# Patient Record
Sex: Female | Born: 1965 | Hispanic: No | Marital: Married | Smoking: Never smoker
Health system: Southern US, Community
[De-identification: ages and names within clinical notes are randomized; demographics above are authoritative.]

## PROBLEM LIST (undated history)

## (undated) DIAGNOSIS — K861 Other chronic pancreatitis: Secondary | ICD-10-CM

## (undated) DIAGNOSIS — D509 Iron deficiency anemia, unspecified: Secondary | ICD-10-CM

## (undated) DIAGNOSIS — F32A Depression, unspecified: Secondary | ICD-10-CM

## (undated) DIAGNOSIS — F419 Anxiety disorder, unspecified: Secondary | ICD-10-CM

## (undated) DIAGNOSIS — R7303 Prediabetes: Secondary | ICD-10-CM

## (undated) DIAGNOSIS — F329 Major depressive disorder, single episode, unspecified: Secondary | ICD-10-CM

## (undated) DIAGNOSIS — C259 Malignant neoplasm of pancreas, unspecified: Secondary | ICD-10-CM

## (undated) DIAGNOSIS — R609 Edema, unspecified: Secondary | ICD-10-CM

## (undated) DIAGNOSIS — C787 Secondary malignant neoplasm of liver and intrahepatic bile duct: Secondary | ICD-10-CM

## (undated) HISTORY — DX: Other chronic pancreatitis: K86.1

## (undated) HISTORY — DX: Prediabetes: R73.03

## (undated) HISTORY — DX: Malignant neoplasm of pancreas, unspecified: C25.9

## (undated) HISTORY — DX: Iron deficiency anemia, unspecified: D50.9

---

## 2014-08-18 DIAGNOSIS — R7303 Prediabetes: Secondary | ICD-10-CM

## 2014-08-18 HISTORY — DX: Prediabetes: R73.03

## 2017-02-20 ENCOUNTER — Encounter (HOSPITAL_COMMUNITY): Payer: Self-pay | Admitting: Emergency Medicine

## 2017-02-20 ENCOUNTER — Emergency Department (HOSPITAL_COMMUNITY): Payer: Medicaid Other

## 2017-02-20 ENCOUNTER — Emergency Department (HOSPITAL_COMMUNITY)
Admission: EM | Admit: 2017-02-20 | Discharge: 2017-02-20 | Disposition: A | Discharge status: Home / Self Care | Payer: Medicaid Other | Attending: Emergency Medicine | Admitting: Emergency Medicine

## 2017-02-20 DIAGNOSIS — R109 Unspecified abdominal pain: Secondary | ICD-10-CM

## 2017-02-20 DIAGNOSIS — Z791 Long term (current) use of non-steroidal anti-inflammatories (NSAID): Secondary | ICD-10-CM | POA: Insufficient documentation

## 2017-02-20 DIAGNOSIS — R1013 Epigastric pain: Secondary | ICD-10-CM | POA: Diagnosis present

## 2017-02-20 DIAGNOSIS — K529 Noninfective gastroenteritis and colitis, unspecified: Secondary | ICD-10-CM | POA: Diagnosis not present

## 2017-02-20 LAB — URINALYSIS, ROUTINE W REFLEX MICROSCOPIC
GLUCOSE, UA: NEGATIVE mg/dL
Ketones, ur: NEGATIVE mg/dL
Nitrite: NEGATIVE
PH: 5 (ref 5.0–8.0)
Protein, ur: 100 mg/dL — AB
SPECIFIC GRAVITY, URINE: 1.027 (ref 1.005–1.030)

## 2017-02-20 LAB — COMPREHENSIVE METABOLIC PANEL
ALT: 18 U/L (ref 14–54)
ANION GAP: 9 (ref 5–15)
AST: 22 U/L (ref 15–41)
Albumin: 3.9 g/dL (ref 3.5–5.0)
Alkaline Phosphatase: 56 U/L (ref 38–126)
BUN: 10 mg/dL (ref 6–20)
CHLORIDE: 106 mmol/L (ref 101–111)
CO2: 22 mmol/L (ref 22–32)
CREATININE: 0.9 mg/dL (ref 0.44–1.00)
Calcium: 9.2 mg/dL (ref 8.9–10.3)
Glucose, Bld: 126 mg/dL — ABNORMAL HIGH (ref 65–99)
Potassium: 3.6 mmol/L (ref 3.5–5.1)
SODIUM: 137 mmol/L (ref 135–145)
Total Bilirubin: 0.7 mg/dL (ref 0.3–1.2)
Total Protein: 7.1 g/dL (ref 6.5–8.1)

## 2017-02-20 LAB — CBC
HCT: 37.8 % (ref 36.0–46.0)
HEMOGLOBIN: 11.8 g/dL — AB (ref 12.0–15.0)
MCH: 26.3 pg (ref 26.0–34.0)
MCHC: 31.2 g/dL (ref 30.0–36.0)
MCV: 84.2 fL (ref 78.0–100.0)
Platelets: 140 10*3/uL — ABNORMAL LOW (ref 150–400)
RBC: 4.49 MIL/uL (ref 3.87–5.11)
RDW: 13.8 % (ref 11.5–15.5)
WBC: 5.5 10*3/uL (ref 4.0–10.5)

## 2017-02-20 LAB — LIPASE, BLOOD: LIPASE: 24 U/L (ref 11–51)

## 2017-02-20 LAB — PREGNANCY, URINE: Preg Test, Ur: NEGATIVE

## 2017-02-20 MED ORDER — TRAMADOL HCL 50 MG PO TABS: 50.0000 mg | ORAL_TABLET | Freq: Four times a day (QID) | ORAL | 0 refills | Status: DC | PRN

## 2017-02-20 MED ORDER — IOPAMIDOL (ISOVUE-300) INJECTION 61%
INTRAVENOUS | Status: AC
  Administered 2017-02-20: 100 mL
  Filled 2017-02-20: qty 100

## 2017-02-20 NOTE — ED Triage Notes (Signed)
Reports left upper quad abdominal pain that radiates into the back.  Started 4-5 days ago.  Denies any n/v/d.  Reports when it started it was in right and left upper quads.  States I thought it was gas and tried something for gas with no relief.

## 2017-02-20 NOTE — Discharge Instructions (Signed)
Please read attached information. If you experience any new or worsening signs or symptoms please return to the emergency room for evaluation. Please follow-up with your primary care provider or specialist as discussed. Please use medication prescribed only as directed and discontinue taking if you have any concerning signs or symptoms.   °

## 2017-02-20 NOTE — ED Provider Notes (Signed)
[facility] DEPT Provider Note   CSN: [id number] Arrival date & time: 02/20/17  1132   History   Chief Complaint Chief Complaint  Patient presents with  . Abdominal Pain    HPI Crystal Carlson is a 51 y.o. female.  HPI   51 year old female presents today with complaints of abdominal pain.  Patient reports approximately 5 days ago she had generalized abdominal discomfort.  She notes this is localized to the epigastric and left upper quadrant.  She notes pain is worsened when laying back, also reports pain with palpation to both right upper and left upper abdomen.  Patient denies any associated nausea or vomiting, she denies any constipation or diarrhea.  Patient reports that after eating she feels bloated and has worsened abdominal discomfort.  Patient denies any respiratory complaints including shortness of breath, cough, or fever.  She denies any history of the same, denies any surgical history.  No medications prior to arrival.   History reviewed. No pertinent past medical history.  There are no active problems to display for this patient.   History reviewed. No pertinent surgical history.  OB History    No data available       Home Medications    Prior to Admission medications   Medication Sig Start Date End Date Taking? Authorizing Provider  ibuprofen (ADVIL,MOTRIN) 800 MG tablet Take 800 mg by mouth every 6 (six) hours as needed for headache or mild pain.   Yes [provider]  traMADol (ULTRAM) 50 MG tablet Take 1 tablet (50 mg total) by mouth every 6 (six) hours as needed. 02/20/17   Okey Regal, PA-C    Family History No family history on file.  Social History Social History  Substance Use Topics  . Smoking status: Never Smoker  . Smokeless tobacco: Never Used  . Alcohol use No     Allergies   Patient has no known allergies.   Review of Systems Review of Systems  All other systems reviewed and are negative.    Physical Exam Updated  Vital Signs BP (!) 144/103 (BP Location: Right Arm)   Pulse 60   Temp 98.5 F (36.9 C) (Oral)   Resp 18   Ht 5\' 6"  (1.676 m)   Wt 67.6 kg (149 lb)   LMP 02/20/2017 (Exact Date)   SpO2 100%   BMI 24.05 kg/m   Physical Exam  Constitutional: She is oriented to person, place, and time. She appears well-developed and well-nourished.  HENT:  Head: Normocephalic and atraumatic.  Eyes: Conjunctivae are normal. Pupils are equal, round, and reactive to light. Right eye exhibits no discharge. Left eye exhibits no discharge. No scleral icterus.  Neck: Normal range of motion. No JVD present. No tracheal deviation present.  Pulmonary/Chest: Effort normal. No stridor.  Abdominal: Soft. Bowel sounds are normal. She exhibits no distension and no mass. There is tenderness. There is no rebound and no guarding. No hernia.  TTP of RUQ/ epigastric/ LUQ- remainder of abd exam with no acute findings   Neurological: She is alert and oriented to person, place, and time. Coordination normal.  Psychiatric: She has a normal mood and affect. Her behavior is normal. Judgment and thought content normal.  Nursing note and vitals reviewed.    ED Treatments / Results  Labs (all labs ordered are listed, but only abnormal results are displayed) Labs Reviewed  COMPREHENSIVE METABOLIC PANEL - Abnormal; Notable for the following:       Result Value   Glucose, Bld 126 (*)  All other components within normal limits  CBC - Abnormal; Notable for the following:    Hemoglobin 11.8 (*)    Platelets 140 (*)    All other components within normal limits  URINALYSIS, ROUTINE W REFLEX MICROSCOPIC - Abnormal; Notable for the following:    Color, Urine AMBER (*)    APPearance CLOUDY (*)    Hgb urine dipstick LARGE (*)    Bilirubin Urine SMALL (*)    Protein, ur 100 (*)    Leukocytes, UA SMALL (*)    Bacteria, UA FEW (*)    Squamous Epithelial / LPF 6-30 (*)    All other components within normal limits  LIPASE, BLOOD    PREGNANCY, URINE    EKG  EKG Interpretation None       Radiology Ct Abdomen Pelvis W Contrast  Result Date: 02/20/2017 CLINICAL DATA:  Left-sided abdominal pain x5 days EXAM: CT ABDOMEN AND PELVIS WITH CONTRAST TECHNIQUE: Multidetector CT imaging of the abdomen and pelvis was performed using the standard protocol following bolus administration of intravenous contrast. CONTRAST:  173mL ISOVUE-300 IOPAMIDOL (ISOVUE-300) INJECTION 61% COMPARISON:  None. FINDINGS: Lower chest: No acute abnormality. Hepatobiliary: No focal liver abnormality is seen. No gallstones, gallbladder wall thickening, or biliary dilatation. Pancreas: Atrophic appearance of the body and tail pancreas. No ductal dilatation or enhancing mass lesions. The pancreatic head and uncinate process appear mildly edematous with peripancreatic fatty induration suggesting inflammation. Correlate to exclude pancreatitis. Spleen: Normal Adrenals/Urinary Tract: Normal bilateral adrenal glands and kidneys without obstructive uropathy. The urinary bladder is physiologically distended. Stomach/Bowel: Physiologic distention of the stomach without mural or fold thickening. There is normal small bowel rotation without evidence of bowel obstruction. Slight gaseous distention and mild mural thickening of jejunal loops in the left hemiabdomen may reflect a mild enteritis. Normal-appearing appendix. Vascular/Lymphatic: No significant vascular findings are present. No enlarged abdominal or pelvic lymph nodes. Reproductive: Enhancing cyst in the left ovary consistent with a small corpus luteal cyst measuring 12 mm diameter. Other: Trace free fluid likely physiologic in the pelvis. Musculoskeletal: Mild to moderate central disc bulge L5-S1 with disc space narrowing. No acute nor suspicious osseous lesions. IMPRESSION: 1. Slight mural thickening and gaseous distention of jejunum consistent with a mild enteritis or potentially reactive secondary to what appears be  mild peripancreatic inflammation from an uncomplicated pancreatitis. No mechanical source of obstruction is seen. 2. Corpus luteal cyst of the left ovary measuring 12 mm with trace free fluid in the pelvis, likely physiologic. 3. Mild to moderate central disc bulge L5-S1. Electronically Signed   By: Ashley Royalty M.D.   On: 02/20/2017 14:56    Procedures Procedures (including critical care time)  Medications Ordered in ED Medications  iopamidol (ISOVUE-300) 61 % injection (100 mLs  Contrast Given 02/20/17 1433)     Initial Impression / Assessment and Plan / ED Course  I have reviewed the triage vital signs and the nursing notes.  Pertinent labs & imaging results that were available during my care of the patient were reviewed by me and considered in my medical decision making (see chart for details).      Final Clinical Impressions(s) / ED Diagnoses   Final diagnoses:  Abdominal pain  Enteritis    Labs: UA, Lipase, CMP, CBC  Imaging: The abdomen pelvis with contrast  Consults:  Therapeutics:  Discharge Meds:   Assessment/Plan: 51 year old female presents today with abdominal pain.  Patient's presentation is consistent with enteritis.  She is afebrile nontoxic tolerating p.o.  She has reassuring laboratory analysis.  Patient does have findings of very minor pancreatitis, no elevation in lipase.  She will be given diet instructions, encouraged to return immediately to the emergency room for any new or worsening signs or symptoms.  Patient's niece is a primary care doctor, she will discuss the case with her and she does not have a primary care provider.  Patient verbalized understanding and agreement to today's plan had no further questions or concerns the time discharge      New Prescriptions Discharge Medication List as of 02/20/2017  3:37 PM    START taking these medications   Details  traMADol (ULTRAM) 50 MG tablet Take 1 tablet (50 mg total) by mouth every 6 (six) hours as  needed., Starting Fri 02/20/2017, Print         Anaissa Macfadden, [place], PA-C 02/20/17 1631    Forde Dandy, MD 02/20/17 [id number]

## 2017-05-08 ENCOUNTER — Ambulatory Visit (INDEPENDENT_AMBULATORY_CARE_PROVIDER_SITE_OTHER): Payer: Self-pay | Admitting: Internal Medicine

## 2017-05-08 ENCOUNTER — Encounter: Payer: Self-pay | Admitting: Internal Medicine

## 2017-05-08 VITALS — BP 172/118 | HR 72 | Resp 12 | Ht 64.0 in | Wt 137.0 lb

## 2017-05-08 DIAGNOSIS — R7303 Prediabetes: Secondary | ICD-10-CM

## 2017-05-08 DIAGNOSIS — R1013 Epigastric pain: Secondary | ICD-10-CM

## 2017-05-08 DIAGNOSIS — Z8719 Personal history of other diseases of the digestive system: Secondary | ICD-10-CM

## 2017-05-08 MED ORDER — FAMOTIDINE 20 MG PO TABS: ORAL_TABLET | ORAL | 4 refills | Status: DC

## 2017-05-08 NOTE — Progress Notes (Signed)
Subjective:    Patient ID: Crystal Carlson, female    DOB: 1966-04-07, 51 y.o.   MRN: [id number]  HPI   Here to establish  1.  Abdominal pain and back pain that seem to be connected for past 2-3 months.   Was seen 02/20/2017 for this pain after 2 weeks.  CT scan showed inflammation and atrophy of the pancreas.  Gallbladder was read as normal.  Lipase normal at 24. WBCs not elevated.    States pain originally started in epigastric area with heaviness and, cramping and gas pain after eating a greasy dinner and drinking some water.  Pain radiated down to include entire abdomen and to upper back. The pain was constant, but much worse if she ate.  Never with nausea or vomiting.   BMs did not change from usual.  No melena, hematochezia, floating or greasy stools. Has lost 13 lbs since this started. Does have history of elevated triglycerides--400 + in past per patient Last meal was 10 a.m. This morning.  Currently without food for 6 hours.  Current Meds  Medication Sig  . [DISCONTINUED] ibuprofen (ADVIL,MOTRIN) 800 MG tablet Take 800 mg by mouth every 6 (six) hours as needed for headache or mild pain.  . [DISCONTINUED] naproxen (NAPROSYN) 500 MG tablet Take 500 mg by mouth 2 (two) times daily with a meal.    No Known Allergies   Past Medical History:  Diagnosis Date  . Prediabetes 2016   when living in [place]   History reviewed. No pertinent surgical history.   Family History  Problem Relation Age of Onset  . Hypertension Mother   . Arthritis Mother   . Diabetes Father   . Heart disease Father        ?valvular problems  . Hyperlipidemia Son   . Diabetes Brother     Social History   Social History  . Marital status: Married    Spouse name: N/A  . Number of children: 3  . Years of education: college grad--civics--states equivalent to High school here.   Occupational History  . Makes eyeglass lenses for an [employer] History Main Topics  . Smoking status:  Never Smoker  . Smokeless tobacco: Never Used  . Alcohol use No  . Drug use: No  . Sexual activity: Not on file   Other Topics Concern  . Not on file   Social History Narrative   Originally from [place]   Moved to [place] In 55   Husband came in 2000   Lives at home with husband, 3 children and mother and father in Sports [employer].        Review of Systems     Objective:   Physical Exam   NAD HEENT:  PERRL EOMI, TMs pearly gray, throat without injection Neck:  Supple, No adenopathy, no thyromegaly. Chest:  CTA CV:  RRR with normal S1 and S2, No S3, S4 or murmur.  Radial and DP pulses normal and equal Abd:  S, minimal epigastric tenderness.  Negative Murphy's sign.  No rebound or peritoneal signs.  + BS LE:  No edema        Assessment & Plan:  1. Epigastric pain with abnormal pancreas with inflammation at head and atrophy of body and tail back in July with CT scan.  Also with normal lipase.   No definite symptoms of malabsorption, though has had significant weight loss--could be due to limitations of eating. Have set up with abdominal ultrasound to evaluate  gallbladder and CBD.  Hopefully will get another look at pancreas, but discussed if abdominal pain continues may need another study to view pancreas again if overlying bowel gas. She does not appear ill today, but new to me. Check FLP, repeat CBC and CMP.

## 2017-05-08 NOTE — Progress Notes (Signed)
Spoke with [facility] scheduling and she has been scheduled for [place] on 05/13/17 @ 7 am. Patient to arrive by 6:45 am.  Patient informed and verbalized understanding

## 2017-05-09 LAB — CBC WITH DIFFERENTIAL/PLATELET
Basophils Absolute: 0 10*3/uL (ref 0.0–0.2)
Basos: 1 %
EOS (ABSOLUTE): 0.2 10*3/uL (ref 0.0–0.4)
Eos: 4 %
HEMATOCRIT: 31.4 % — AB (ref 34.0–46.6)
Hemoglobin: 9.9 g/dL — ABNORMAL LOW (ref 11.1–15.9)
IMMATURE GRANS (ABS): 0 10*3/uL (ref 0.0–0.1)
Immature Granulocytes: 0 %
Lymphocytes Absolute: 1 10*3/uL (ref 0.7–3.1)
Lymphs: 22 %
MCH: 24.6 pg — AB (ref 26.6–33.0)
MCHC: 31.5 g/dL (ref 31.5–35.7)
MCV: 78 fL — AB (ref 79–97)
MONOS ABS: 0.4 10*3/uL (ref 0.1–0.9)
Monocytes: 8 %
NEUTROS PCT: 65 %
Neutrophils Absolute: 3.1 10*3/uL (ref 1.4–7.0)
PLATELETS: 117 10*3/uL — AB (ref 150–379)
RBC: 4.02 x10E6/uL (ref 3.77–5.28)
RDW: 15 % (ref 12.3–15.4)
WBC: 4.7 10*3/uL (ref 3.4–10.8)

## 2017-05-09 LAB — COMPREHENSIVE METABOLIC PANEL
A/G RATIO: 1.6 (ref 1.2–2.2)
ALT: 19 IU/L (ref 0–32)
AST: 23 IU/L (ref 0–40)
Albumin: 4.3 g/dL (ref 3.5–5.5)
Alkaline Phosphatase: 65 IU/L (ref 39–117)
BILIRUBIN TOTAL: 0.3 mg/dL (ref 0.0–1.2)
BUN / CREAT RATIO: 19 (ref 9–23)
BUN: 15 mg/dL (ref 6–24)
CHLORIDE: 102 mmol/L (ref 96–106)
CO2: 24 mmol/L (ref 20–29)
Calcium: 9.8 mg/dL (ref 8.7–10.2)
Creatinine, Ser: 0.79 mg/dL (ref 0.57–1.00)
GFR calc non Af Amer: 88 mL/min/{1.73_m2} (ref >59)
GFR, EST AFRICAN AMERICAN: 102 mL/min/{1.73_m2} (ref >59)
GLOBULIN, TOTAL: 2.7 g/dL (ref 1.5–4.5)
Glucose: 99 mg/dL (ref 65–99)
POTASSIUM: 3.7 mmol/L (ref 3.5–5.2)
SODIUM: 142 mmol/L (ref 134–144)
TOTAL PROTEIN: 7 g/dL (ref 6.0–8.5)

## 2017-05-09 LAB — LIPID PANEL W/O CHOL/HDL RATIO
Cholesterol, Total: 146 mg/dL (ref 100–199)
HDL: 47 mg/dL (ref >39)
LDL CALC: 77 mg/dL (ref 0–99)
TRIGLYCERIDES: 112 mg/dL (ref 0–149)
VLDL Cholesterol Cal: 22 mg/dL (ref 5–40)

## 2017-05-09 LAB — LIPASE: LIPASE: 31 U/L (ref 14–72)

## 2017-05-09 LAB — AMYLASE: AMYLASE: 75 U/L (ref 31–124)

## 2017-05-09 LAB — HGB A1C W/O EAG: Hgb A1c MFr Bld: 6 % — ABNORMAL HIGH (ref 4.8–5.6)

## 2017-05-13 ENCOUNTER — Ambulatory Visit (HOSPITAL_COMMUNITY)
Admission: RE | Admit: 2017-05-13 | Discharge: 2017-05-13 | Disposition: A | Discharge status: Home / Self Care | Payer: Self-pay | Source: Ambulatory Visit | Attending: Internal Medicine | Admitting: Internal Medicine

## 2017-05-13 DIAGNOSIS — Z8719 Personal history of other diseases of the digestive system: Secondary | ICD-10-CM | POA: Insufficient documentation

## 2017-05-13 DIAGNOSIS — R1013 Epigastric pain: Secondary | ICD-10-CM | POA: Insufficient documentation

## 2017-05-15 ENCOUNTER — Encounter: Payer: Self-pay | Admitting: Internal Medicine

## 2017-05-15 NOTE — Telephone Encounter (Signed)
  Spoke with patient informed she needs to be seen. Patient having abdominal pain going through to her back x 2-3 days. Offered patient 2pm appt today and patient declined and asked to come in on Tuesday at 12:30. Patient appt scheduled for 05/19/17 @ 12:30 pm.

## 2017-05-15 NOTE — Telephone Encounter (Signed)
See previous message

## 2017-05-19 ENCOUNTER — Ambulatory Visit (INDEPENDENT_AMBULATORY_CARE_PROVIDER_SITE_OTHER): Payer: Self-pay | Admitting: Internal Medicine

## 2017-05-19 ENCOUNTER — Encounter: Payer: Self-pay | Admitting: Internal Medicine

## 2017-05-19 VITALS — BP 142/90 | HR 66 | Resp 12 | Ht 64.0 in | Wt 135.0 lb

## 2017-05-19 DIAGNOSIS — K861 Other chronic pancreatitis: Secondary | ICD-10-CM

## 2017-05-19 DIAGNOSIS — D649 Anemia, unspecified: Secondary | ICD-10-CM

## 2017-05-19 DIAGNOSIS — R1013 Epigastric pain: Secondary | ICD-10-CM

## 2017-05-19 DIAGNOSIS — D509 Iron deficiency anemia, unspecified: Secondary | ICD-10-CM

## 2017-05-19 HISTORY — DX: Other chronic pancreatitis: K86.1

## 2017-05-19 HISTORY — DX: Iron deficiency anemia, unspecified: D50.9

## 2017-05-19 MED ORDER — HYDROCODONE-ACETAMINOPHEN 5-325 MG PO TABS: 1.0000 | ORAL_TABLET | Freq: Four times a day (QID) | ORAL | 0 refills | Status: DC | PRN

## 2017-05-19 NOTE — Progress Notes (Signed)
   Subjective:    Patient ID: Crystal Carlson, female    DOB: 1965/11/09, 51 y.o.   MRN: [id number]  HPI   Epigstrastric pain radiating into her back is worsening.  To recap:  She underwent CT of abdomen 02/20/2017 which showed atrophic appearance of body and tail, but inflammation of head and peripancreatic head. She established here on September 21 and subsequently underwent bloodwork to evaluate further:  Triglycerides were normal, Lipase and Amylase remain normal. Moderate microcytic anemia.   Abdominal ultrasound showed normal gallbladder and CBD.  Overlying bowel gas did not allow for adequate visualization of pancreas. Her pain has worsened since last seen--particularly about 3 days ago.  Poor sleep as pain worsens when lies on her back.   No nausea or vomiting. She thinks her stool floats, does not necessarily look greasy, + foul smell.  History with this a bit different than last visit. Is taking some sort of fiber home remedy.   No family history of autoimmune illnesses.  No personal history of autoimmune illness.  Current Meds  Medication Sig  . famotidine (PEPCID) 20 MG tablet 2 tabs by mouth at bedtime daily    No Known Allergies     Review of Systems     Objective:   Physical Exam  Pale, appears mild to moderately ill Lungs:  CTA CV:  RRR without murmur or rub, radial and DP pulses normal and equal Abd:  S, Minimal epigastric tenderness, + BS, No HSM or mass LE:  No edema.        Assessment & Plan:  1.  Likely chronic pancreatitis with changes via CT and normal lipase.  Previously seemed to be improved by history from her more acute symptoms in July.  Ultrasound shows normal Gallbladder and CBD.  With worsening pain now and no etiology for her pancreatic changes on CT, feel we need a better visualization particularly of pancreatic head where inflammation noted in July. Will discuss MR/MRCP need with GI and see about getting into them ASAP.   Not clear if weight  loss due to difficulties with limiting intake due to pain since July or if malabsorption May need pancreatic biopsy and extensive work up for cause and for possible malabsorption Hydrocodone/Acetaminophen 5/325 mg 1-2 tabs every 6 hours for pain Note for work. Ultimately spoke with Dr. Alessandra Bevels at [facility] and he will see about getting her seen in next few days. I will see about getting MR/MRCP through [facility] ASAP.  2.  Microcytic anemia:  Stool card packet sent home with patient to return in 1.5 weeks and iron studies drawn.

## 2017-05-19 NOTE — Patient Instructions (Signed)
We will call you once we are able to contact Gastroenterology and get you in for further evaluation.

## 2017-05-20 LAB — IRON AND TIBC
IRON SATURATION: 12 % — AB (ref 15–55)
IRON: 48 ug/dL (ref 27–159)
TIBC: 400 ug/dL (ref 250–450)
UIBC: 352 ug/dL (ref 131–425)

## 2017-05-25 ENCOUNTER — Emergency Department (HOSPITAL_COMMUNITY): Payer: Medicaid Other

## 2017-05-25 ENCOUNTER — Encounter (HOSPITAL_COMMUNITY): Payer: Self-pay | Admitting: Emergency Medicine

## 2017-05-25 ENCOUNTER — Inpatient Hospital Stay (HOSPITAL_COMMUNITY): Payer: Medicaid Other

## 2017-05-25 ENCOUNTER — Inpatient Hospital Stay (HOSPITAL_COMMUNITY)
Admission: EM | Admit: 2017-05-25 | Discharge: 2017-05-27 | DRG: 435 | Disposition: A | Discharge status: Home / Self Care | Payer: Medicaid Other | Attending: Internal Medicine | Admitting: Internal Medicine

## 2017-05-25 DIAGNOSIS — K8689 Other specified diseases of pancreas: Secondary | ICD-10-CM | POA: Diagnosis present

## 2017-05-25 DIAGNOSIS — R160 Hepatomegaly, not elsewhere classified: Secondary | ICD-10-CM | POA: Diagnosis present

## 2017-05-25 DIAGNOSIS — I82890 Acute embolism and thrombosis of other specified veins: Secondary | ICD-10-CM | POA: Diagnosis present

## 2017-05-25 DIAGNOSIS — R109 Unspecified abdominal pain: Secondary | ICD-10-CM | POA: Diagnosis not present

## 2017-05-25 DIAGNOSIS — C259 Malignant neoplasm of pancreas, unspecified: Secondary | ICD-10-CM | POA: Diagnosis not present

## 2017-05-25 DIAGNOSIS — I81 Portal vein thrombosis: Secondary | ICD-10-CM | POA: Diagnosis present

## 2017-05-25 DIAGNOSIS — D509 Iron deficiency anemia, unspecified: Secondary | ICD-10-CM | POA: Diagnosis present

## 2017-05-25 DIAGNOSIS — Z833 Family history of diabetes mellitus: Secondary | ICD-10-CM

## 2017-05-25 DIAGNOSIS — K861 Other chronic pancreatitis: Secondary | ICD-10-CM | POA: Diagnosis present

## 2017-05-25 DIAGNOSIS — N8312 Corpus luteum cyst of left ovary: Secondary | ICD-10-CM | POA: Diagnosis present

## 2017-05-25 DIAGNOSIS — N83209 Unspecified ovarian cyst, unspecified side: Secondary | ICD-10-CM | POA: Diagnosis present

## 2017-05-25 DIAGNOSIS — G4700 Insomnia, unspecified: Secondary | ICD-10-CM | POA: Diagnosis present

## 2017-05-25 DIAGNOSIS — R1013 Epigastric pain: Secondary | ICD-10-CM | POA: Diagnosis not present

## 2017-05-25 DIAGNOSIS — R945 Abnormal results of liver function studies: Secondary | ICD-10-CM

## 2017-05-25 DIAGNOSIS — C787 Secondary malignant neoplasm of liver and intrahepatic bile duct: Secondary | ICD-10-CM | POA: Diagnosis not present

## 2017-05-25 DIAGNOSIS — K869 Disease of pancreas, unspecified: Secondary | ICD-10-CM | POA: Diagnosis not present

## 2017-05-25 DIAGNOSIS — G893 Neoplasm related pain (acute) (chronic): Secondary | ICD-10-CM | POA: Diagnosis not present

## 2017-05-25 DIAGNOSIS — E876 Hypokalemia: Secondary | ICD-10-CM | POA: Diagnosis present

## 2017-05-25 DIAGNOSIS — Z79899 Other long term (current) drug therapy: Secondary | ICD-10-CM

## 2017-05-25 DIAGNOSIS — C799 Secondary malignant neoplasm of unspecified site: Secondary | ICD-10-CM

## 2017-05-25 DIAGNOSIS — D696 Thrombocytopenia, unspecified: Secondary | ICD-10-CM | POA: Diagnosis present

## 2017-05-25 DIAGNOSIS — I10 Essential (primary) hypertension: Secondary | ICD-10-CM | POA: Diagnosis present

## 2017-05-25 DIAGNOSIS — R7989 Other specified abnormal findings of blood chemistry: Secondary | ICD-10-CM | POA: Diagnosis present

## 2017-05-25 DIAGNOSIS — C257 Malignant neoplasm of other parts of pancreas: Secondary | ICD-10-CM | POA: Diagnosis present

## 2017-05-25 DIAGNOSIS — K838 Other specified diseases of biliary tract: Secondary | ICD-10-CM | POA: Diagnosis present

## 2017-05-25 DIAGNOSIS — Z8249 Family history of ischemic heart disease and other diseases of the circulatory system: Secondary | ICD-10-CM

## 2017-05-25 DIAGNOSIS — K5900 Constipation, unspecified: Secondary | ICD-10-CM | POA: Diagnosis present

## 2017-05-25 LAB — COMPREHENSIVE METABOLIC PANEL
ALBUMIN: 3.8 g/dL (ref 3.5–5.0)
ALT: 75 U/L — ABNORMAL HIGH (ref 14–54)
ANION GAP: 10 (ref 5–15)
AST: 76 U/L — ABNORMAL HIGH (ref 15–41)
Alkaline Phosphatase: 116 U/L (ref 38–126)
BUN: 5 mg/dL — ABNORMAL LOW (ref 6–20)
CALCIUM: 9.4 mg/dL (ref 8.9–10.3)
CO2: 25 mmol/L (ref 22–32)
Chloride: 103 mmol/L (ref 101–111)
Creatinine, Ser: 0.73 mg/dL (ref 0.44–1.00)
GFR calc non Af Amer: >60 mL/min (ref >60)
Glucose, Bld: 110 mg/dL — ABNORMAL HIGH (ref 65–99)
Potassium: 3.5 mmol/L (ref 3.5–5.1)
SODIUM: 138 mmol/L (ref 135–145)
TOTAL PROTEIN: 7.1 g/dL (ref 6.5–8.1)
Total Bilirubin: 0.6 mg/dL (ref 0.3–1.2)

## 2017-05-25 LAB — CBC
HCT: 32.8 % — ABNORMAL LOW (ref 36.0–46.0)
HEMOGLOBIN: 10.5 g/dL — AB (ref 12.0–15.0)
MCH: 25.2 pg — ABNORMAL LOW (ref 26.0–34.0)
MCHC: 32 g/dL (ref 30.0–36.0)
MCV: 78.7 fL (ref 78.0–100.0)
Platelets: 115 10*3/uL — ABNORMAL LOW (ref 150–400)
RBC: 4.17 MIL/uL (ref 3.87–5.11)
RDW: 15.3 % (ref 11.5–15.5)
WBC: 7 10*3/uL (ref 4.0–10.5)

## 2017-05-25 LAB — URINALYSIS, ROUTINE W REFLEX MICROSCOPIC
BILIRUBIN URINE: NEGATIVE
Bacteria, UA: NONE SEEN
Glucose, UA: NEGATIVE mg/dL
Ketones, ur: NEGATIVE mg/dL
NITRITE: NEGATIVE
PROTEIN: NEGATIVE mg/dL
SPECIFIC GRAVITY, URINE: 1.004 — AB (ref 1.005–1.030)
pH: 6 (ref 5.0–8.0)

## 2017-05-25 LAB — LIPASE, BLOOD: LIPASE: 19 U/L (ref 11–51)

## 2017-05-25 LAB — HIV ANTIBODY (ROUTINE TESTING W REFLEX): HIV Screen 4th Generation wRfx: NONREACTIVE

## 2017-05-25 MED ORDER — DIAZEPAM 2 MG PO TABS
2.0000 mg | ORAL_TABLET | Freq: Two times a day (BID) | ORAL | Status: DC | PRN
  Filled 2017-05-25: qty 1

## 2017-05-25 MED ORDER — ONDANSETRON HCL 4 MG/2ML IJ SOLN: 4.0000 mg | Freq: Three times a day (TID) | INTRAMUSCULAR | Status: DC | PRN

## 2017-05-25 MED ORDER — MORPHINE SULFATE (PF) 4 MG/ML IV SOLN
2.0000 mg | INTRAVENOUS | Status: DC | PRN
  Administered 2017-05-25 (×3): 2 mg via INTRAVENOUS
  Filled 2017-05-25 (×3): qty 1

## 2017-05-25 MED ORDER — HYDRALAZINE HCL 20 MG/ML IJ SOLN: 5.0000 mg | INTRAMUSCULAR | Status: DC | PRN

## 2017-05-25 MED ORDER — ENOXAPARIN SODIUM 40 MG/0.4ML ~~LOC~~ SOLN
40.0000 mg | SUBCUTANEOUS | Status: DC
  Filled 2017-05-25: qty 0.4

## 2017-05-25 MED ORDER — IOPAMIDOL (ISOVUE-300) INJECTION 61%
INTRAVENOUS | Status: AC
  Administered 2017-05-25: 75 mL
  Filled 2017-05-25: qty 75

## 2017-05-25 MED ORDER — MORPHINE SULFATE (PF) 4 MG/ML IV SOLN
4.0000 mg | Freq: Once | INTRAVENOUS | Status: AC
Start: 2017-05-25 — End: 2017-05-25
  Administered 2017-05-25: 4 mg via INTRAVENOUS
  Filled 2017-05-25: qty 1

## 2017-05-25 MED ORDER — ONDANSETRON HCL 4 MG/2ML IJ SOLN
4.0000 mg | Freq: Once | INTRAMUSCULAR | Status: AC
  Administered 2017-05-25: 4 mg via INTRAVENOUS
  Filled 2017-05-25: qty 2

## 2017-05-25 MED ORDER — SODIUM CHLORIDE 0.9 % IV BOLUS (SEPSIS)
1000.0000 mL | Freq: Once | INTRAVENOUS | Status: AC
  Administered 2017-05-25: 1000 mL via INTRAVENOUS

## 2017-05-25 MED ORDER — GADOBENATE DIMEGLUMINE 529 MG/ML IV SOLN
15.0000 mL | Freq: Once | INTRAVENOUS | Status: AC | PRN
  Administered 2017-05-25: 12 mL via INTRAVENOUS

## 2017-05-25 MED ORDER — ACETAMINOPHEN 650 MG RE SUPP: 650.0000 mg | Freq: Four times a day (QID) | RECTAL | Status: DC | PRN

## 2017-05-25 MED ORDER — MORPHINE SULFATE (PF) 4 MG/ML IV SOLN
4.0000 mg | Freq: Once | INTRAVENOUS | Status: AC
  Administered 2017-05-25: 4 mg via INTRAVENOUS
  Filled 2017-05-25: qty 1

## 2017-05-25 MED ORDER — SODIUM CHLORIDE 0.9 % IV SOLN
INTRAVENOUS | Status: DC
  Administered 2017-05-25 – 2017-05-26 (×3): via INTRAVENOUS

## 2017-05-25 MED ORDER — ACETAMINOPHEN 325 MG PO TABS: 650.0000 mg | ORAL_TABLET | Freq: Four times a day (QID) | ORAL | Status: DC | PRN

## 2017-05-25 MED ORDER — OXYCODONE HCL 5 MG PO TABS
5.0000 mg | ORAL_TABLET | ORAL | Status: DC | PRN
  Administered 2017-05-25: 5 mg via ORAL
  Administered 2017-05-26 (×6): 10 mg via ORAL
  Administered 2017-05-27: 5 mg via ORAL
  Administered 2017-05-27: 10 mg via ORAL
  Filled 2017-05-25: qty 2
  Filled 2017-05-25: qty 1
  Filled 2017-05-25 (×5): qty 2
  Filled 2017-05-25: qty 1
  Filled 2017-05-25 (×3): qty 2

## 2017-05-25 NOTE — ED Notes (Signed)
Admitting in room at this time. Patient will be transported to inpatient room on [address] [place] afterwards.

## 2017-05-25 NOTE — Consult Note (Signed)
New Hematology/Oncology Consult   Referral MD: Linna Darner     Reason for Referral: Pancreas mass, liver lesions   HPI:  Crystal Carlson reports a history of abdomen/back pain beginning in July of this year. The pain is constant and unrelieved with hydrocodone. She presented to the emergency room yesterday and was admitted for further evaluation. A CT of the abdomen/pelvis 02/20/2017 when she presented to the emergency room with abdominal pain revealed slight mural thickening of the jejunum with mild peripancreatic inflammation. She was diagnosed with enteritis.  In MRI of the abdomen yesterday multiple liver masses, mild diffuse intrahepatic biliary duct dilatation, dilated common bile duct measuring 9 mm, a poorly marginated mass in the pancreas neck with encasement of the hepatic and splenic arteries. 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 Parenchymal atrophy of the pancreas body and tail.  Interventional radiology has been consulted for biopsy of a liver lesion.     Past Medical History:  Diagnosis Date  . G6 P3, 3 miscarriages  05/19/2017  . Microcytic anemia 05/19/2017  . Prediabetes 2016   when living in [place]  :  History reviewed. No pertinent surgical history.:   Current Facility-Administered Medications:  .  0.9 %  sodium chloride infusion, , Intravenous, Continuous, Black, Lezlie Octave, NP, Last Rate: 125 mL/hr at 05/25/17 1155 .  acetaminophen (TYLENOL) tablet 650 mg, 650 mg, Oral, Q6H PRN **OR** acetaminophen (TYLENOL) suppository 650 mg, 650 mg, Rectal, Q6H PRN, Black, Karen M, NP .  diazepam (VALIUM) tablet 2 mg, 2 mg, Oral, Q12H PRN, Waldemar Dickens, MD .  hydrALAZINE (APRESOLINE) injection 5 mg, 5 mg, Intravenous, Q4H PRN, Black, Karen M, NP .  morphine 4 MG/ML injection 2 mg, 2 mg, Intravenous, Q4H PRN, Ivor Costa, MD, 2 mg at 05/25/17 1526 .   ondansetron (ZOFRAN) injection 4 mg, 4 mg, Intravenous, Q8H PRN, Ivor Costa, MD:  :  No Known Allergies:  FH:No family history of cancer   SOCIAL HISTORY: She lives with her husband and children in [place]. She works for an [employer]. No alcohol or tobacco use. No risk factor for HIV or hepatitis.   Review of Systems:  Positives include: Abdomen/back pain for 3 months, 15 pound weight loss  A complete ROS was otherwise negative.   Physical Exam:  Blood pressure (!) 156/87, pulse 60, temperature 98.3 F (36.8 C), temperature source Oral, resp. rate 18, height 5\' 4"  (1.626 m), weight 136 lb (61.7 kg), last menstrual period 05/11/2017, SpO2 100 %.  HEENT: No thrush, neck without mass Lungs: Clear bilaterally Cardiac: Regular rate and rhythm Abdomen: No hepatosplenic the, no mass, tender in the mid upper abdomen  Vascular: No leg edema Lymph nodes: No cervical, supraclavicular, axillary, or inguinal nodes Neurologic: Alert and oriented, the motor exam appears intact in the upper and lower extremities Skin: No rash Musculoskeletal: No spine tenderness  LABS:   Recent Labs  05/25/17 0113  WBC 7.0  HGB 10.5*  HCT 32.8*  PLT 115*     Recent Labs  05/25/17 0113  NA 138  K 3.5  CL 103  CO2 25  GLUCOSE 110*  BUN 5*  CREATININE 0.73  CALCIUM 9.4      RADIOLOGY:  US Abdomen Complete  Result Date: 05/13/2017 CLINICAL DATA:  Epigastric abdominal pain. EXAM: ABDOMEN ULTRASOUND COMPLETE COMPARISON:  CT scan of February 20, 2017. FINDINGS: Gallbladder:  No gallstones or wall thickening visualized. No sonographic Murphy sign noted by sonographer. Common bile duct: Diameter: 4 mm which is within normal limits. Liver: No focal lesion identified. Within normal limits in parenchymal echogenicity. Portal vein is patent on color Doppler imaging with normal direction of blood flow towards the liver. IVC: No abnormality visualized. Pancreas: Not visualized due to  overlying bowel gas. Spleen: Size and appearance within normal limits. Right Kidney: Length: 10 cm. Echogenicity within normal limits. No mass or hydronephrosis visualized. Left Kidney: Length: 10.9 cm. Echogenicity within normal limits. No mass or hydronephrosis visualized. Abdominal aorta: No aneurysm visualized. Other findings: None. IMPRESSION: Pancreas not visualized due to overlying bowel gas. No definite abnormality seen in the abdomen. Electronically Signed   By: Marijo Conception, M.D.   On: 05/13/2017 09:14   Mr Abdomen Mrcp Moise Boring Contast  Result Date: 05/25/2017 CLINICAL DATA:  Severe epigastric abdominal pain for 2 weeks. Elevated liver function tests. EXAM: MRI ABDOMEN WITHOUT AND WITH CONTRAST (INCLUDING MRCP) TECHNIQUE: Multiplanar multisequence MR imaging of the abdomen was performed both before and after the administration of intravenous contrast. Heavily T2-weighted images of the biliary and pancreatic ducts were obtained, and three-dimensional MRCP images were rendered by post processing. CONTRAST:  60mL MULTIHANCE GADOBENATE DIMEGLUMINE 529 MG/ML IV SOLN COMPARISON:  02/20/2017 CT abdomen/pelvis. 05/13/2017 abdominal sonogram. FINDINGS: Lower chest: No acute abnormality at the lung bases. Hepatobiliary: Normal liver size and configuration. No hepatic steatosis. There are least 5 similar liver masses scattered throughout the liver, all new since 02/20/2017 CT study, each demonstrating T2 hyperintensity, T1 hypointensity and hypoenhancement, largest 1.8 x 1.2 cm in segment 4A of the left liver lobe (series 7/ image 10) and 1.3 x 1.1 cm in segment 6 of the right liver lobe (series 7/image 13). Distended gallbladder with no cholelithiasis, gallbladder wall thickening or pericholecystic fluid. Mild diffuse intrahepatic biliary ductal dilatation, new. Newly dilated common bile duct measuring 9 mm diameter approximately, with abrupt caliber transition of the common bile duct at the level of the  pancreatic neck, with small caliber of the lower third of the common bile duct. No choledocholithiasis. Pancreas: There is a poorly marginated infiltrative-appearing hypoenhancing pancreatic neck mass measuring approximately 4.6 x 2.8 cm (series 1303/ image 64). This mass appears to encase the common hepatic artery and proximal splenic artery. This mass appears to encase and mildly narrow the proximal superior mesenteric artery (series 1304/ image 75). 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 There is mild irregular dilatation of the main pancreatic duct in the pancreatic body and tail up to 4 mm diameter. There is parenchymal atrophy in the pancreatic body and tail. No pancreas divisum. Spleen: Top-normal size spleen (craniocaudal splenic length 12.8 cm. No splenic mass. Adrenals/Urinary Tract: No discrete adrenal nodules. No hydronephrosis. Normal kidneys with no renal mass. Stomach/Bowel: Grossly normal stomach. Visualized small and large bowel is normal caliber, with no bowel wall thickening. Vascular/Lymphatic: Normal caliber abdominal aorta. Patent hepatic and renal veins. Mildly enlarged 1.1 cm portacaval node (series 1303/image 63) . Other: No abdominal ascites or focal fluid collection. Musculoskeletal: No aggressive appearing focal osseous lesions. IMPRESSION: 1. Infiltrative 4.6 x 2.8 cm hypoenhancing pancreatic neck mass, most compatible with pancreatic adenocarcinoma. Endoscopic ultrasound with FNA recommended for further evaluation. 2. Double duct sign associated with the pancreatic neck mass, with mild intrahepatic biliary ductal dilatation and common bile duct diameter 9 mm. Mild pancreatic  duct dilatation and pancreatic atrophy distal to the pancreatic neck mass. 3. Five scattered hypoenhancing liver masses, most compatible with liver metastases. 4. Mild portacaval adenopathy suspicious for nodal metastasis. 5.  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 Encasement of the common hepatic artery, proximal splenic artery and proximal SMA by the mass. These results were called by telephone at the time of interpretation on 05/25/2017 at 8:34 am to Dyanne Carrel, NP, who verbally acknowledged these results. Electronically Signed   By: Ilona Sorrel M.D.   On: 05/25/2017 08:52    Assessment and Plan:   1. 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 Pain secondary to #1  3. Weight loss  4.  Microcytic anemia  Crystal Carlson presents with a three-month history of abdomen/back pain. An MRI of the abdomen confirms a pancreas neck mass with early biliary obstruction and vascular encasement. There are multiple liver lesions.   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 I discussed the probable diagnosis and treatment options with Crystal Carlson. No therapy will be curative if she is confirmed to have metastatic pancreas cancer.  Recommendations: 1. Proceed with diagnostic biopsy of a liver lesion 2. Check CA 19-9 3. Consult GI to consider placement of a bile duct stent if a diagnosis of pancreas cancer is confirmed 4. Trial of oxycodone for pain  I will continue following her in the hospital and we will schedule an outpatient appointment at the [facility].   Donneta Romberg, MD 05/25/2017, 5:12 PM

## 2017-05-25 NOTE — ED Triage Notes (Signed)
Pt c/o abdominal pain x 3 months. Denies nausea/vomiting/diarrhea. States that her pain medications are no longer working.

## 2017-05-25 NOTE — Consult Note (Signed)
[facility] Reason for consult:pancreatic cancer by MRI Referring Physician: [facility] hospital[facility]. PCP: Dr. Amil Amen. Primary G.I.: Dr. Christoper Allegra Crystal is an 51 y.o. Carlson.  HPI: Crystal Carlson was recently referred by Dr. Amil Amen to Dr. Alessandra Bevels due to epigastric pain which is been going on for several months. CT scan of the abdomen was performed 7/6 and this revealed no evidence of liver metastases in an atrophic body and tail of the pancreas with no ductal dilatation or masses. There was a suggestion of possible inflammation of the pancreas. No gallstones were seen. The Crystal Carlson is a nondrinker and it was felt that Crystal Carlson likely had passed gallstone. Low-fat diet as recommended and Crystal Carlson was placed on Creon. Crystal Carlson continued to have pain got worse and came to the emergency room. Pain was sharp and radiator back. Ultrasound not show gallstones. CBD was not clearly dilated.LFT showed normal bilirubin. Recent lipase and amylase were normal. Transaminases in the emergency room are up just a little.MRI was obtained and revealed at least 5 lesions in the liver consistent with metastatic disease it was not seen on CT scan 2 months ago. There was new mild ductal dilatation of the CBD with no stones in the gallbladder no choledocholithiasis. The pancreas revealed a 4 cm hypo-enhancing lesion in the pancreatic neck consistent with the pancreatic carcinoma. This appeared to encase the hepatic artery, portal vein and splenic artery. Portal vein and splenic vein appeared to be occluded with cavernous transformation. There was diffuse atrophy of the pancreas. The interpretation was aggressive pancreatic adenocarcinoma with double duct sign and liver metastases that was not present on CT scan 2 months ago.Crystal Carlson notes that Crystal Carlson pain is a little bit better.  Past Medical History:  Diagnosis Date  . Chronic pancreatitis ([place]) 05/19/2017  . Microcytic anemia 05/19/2017  . Prediabetes 2016   when living in  [place]    History reviewed. No pertinent surgical history.  Family History  Problem Relation Age of Onset  . Hypertension Mother   . Arthritis Mother   . Diabetes Father   . Heart disease Father        ?valvular problems  . Hyperlipidemia Son   . Diabetes Brother     Social History:  reports that Crystal Carlson has never smoked. Crystal Carlson has never used smokeless tobacco. Crystal Carlson reports that Crystal Carlson does not drink alcohol or use drugs.  Allergies: No Known Allergies  Medications; Prior to Admission medications   Medication Sig Start Date End Date Taking? Authorizing Provider  HYDROcodone-acetaminophen (NORCO/VICODIN) 5-325 MG tablet Take 1-2 tablets by mouth every 6 (six) hours as needed for moderate pain. 05/19/17  Yes Mack Hook, MD  lipase/protease/amylase (CREON) 36000 UNITS CPEP capsule Take 36,000 Units by mouth 3 (three) times daily with meals.   Yes [provider]    PRN Meds acetaminophen **OR** acetaminophen, diazepam, hydrALAZINE, morphine injection, ondansetron (ZOFRAN) IV Results for orders placed or performed during the hospital encounter of 05/25/17 (from the past 48 hour(s))  Lipase, blood     Status: None   Collection Time: 05/25/17  1:13 AM  Result Value Ref Range   Lipase 19 11 - 51 U/L  Comprehensive metabolic panel     Status: Abnormal   Collection Time: 05/25/17  1:13 AM  Result Value Ref Range   Sodium 138 135 - 145 mmol/L   Potassium 3.5 3.5 - 5.1 mmol/L   Chloride 103 101 - 111 mmol/L   CO2 25 22 - 32 mmol/L   Glucose, Bld 110 (  H) 65 - 99 mg/dL   BUN 5 (L) 6 - 20 mg/dL   Creatinine, Ser 7.92 0.44 - 1.00 mg/dL   Calcium 9.4 8.9 - 86.2 mg/dL   Total Protein 7.1 6.5 - 8.1 g/dL   Albumin 3.8 3.5 - 5.0 g/dL   AST 76 (H) 15 - 41 U/L   ALT 75 (H) 14 - 54 U/L   Alkaline Phosphatase 116 38 - 126 U/L   Total Bilirubin 0.6 0.3 - 1.2 mg/dL   GFR calc non Af Amer >60 >60 mL/min   GFR calc Af Amer >60 >60 mL/min    Comment: (NOTE) The eGFR has been  calculated using the CKD EPI equation. This calculation has not been validated in all clinical situations. eGFR's persistently <60 mL/min signify possible Chronic Kidney Disease.    Anion gap 10 5 - 15  CBC     Status: Abnormal   Collection Time: 05/25/17  1:13 AM  Result Value Ref Range   WBC 7.0 4.0 - 10.5 K/uL   RBC 4.17 3.87 - 5.11 MIL/uL   Hemoglobin 10.5 (L) 12.0 - 15.0 g/dL   HCT 87.0 (L) 48.2 - 47.8 %   MCV 78.7 78.0 - 100.0 fL   MCH 25.2 (L) 26.0 - 34.0 pg   MCHC 32.0 30.0 - 36.0 g/dL   RDW 14.5 97.1 - 55.2 %   Platelets 115 (L) 150 - 400 K/uL    Comment: REPEATED TO VERIFY PLATELET COUNT CONFIRMED BY SMEAR   Urinalysis, Routine w reflex microscopic     Status: Abnormal   Collection Time: 05/25/17  1:25 AM  Result Value Ref Range   Color, Urine STRAW (A) YELLOW   APPearance CLEAR CLEAR   Specific Gravity, Urine 1.004 (L) 1.005 - 1.030   pH 6.0 5.0 - 8.0   Glucose, UA NEGATIVE NEGATIVE mg/dL   Hgb urine dipstick MODERATE (A) NEGATIVE   Bilirubin Urine NEGATIVE NEGATIVE   Ketones, ur NEGATIVE NEGATIVE mg/dL   Protein, ur NEGATIVE NEGATIVE mg/dL   Nitrite NEGATIVE NEGATIVE   Leukocytes, UA LARGE (A) NEGATIVE   RBC / HPF 6-30 0 - 5 RBC/hpf   WBC, UA 6-30 0 - 5 WBC/hpf   Bacteria, UA NONE SEEN NONE SEEN   Squamous Epithelial / LPF 0-5 (A) NONE SEEN    Mr Abdomen Mrcp W Wo Contast  Result Date: 05/25/2017 CLINICAL DATA:  Severe epigastric abdominal pain for 2 weeks. Elevated liver function tests. EXAM: MRI ABDOMEN WITHOUT AND WITH CONTRAST (INCLUDING MRCP) TECHNIQUE: Multiplanar multisequence MR imaging of the abdomen was performed both before and after the administration of intravenous contrast. Heavily T2-weighted images of the biliary and pancreatic ducts were obtained, and three-dimensional MRCP images were rendered by post processing. CONTRAST:  18mL MULTIHANCE GADOBENATE DIMEGLUMINE 529 MG/ML IV SOLN COMPARISON:  02/20/2017 CT abdomen/pelvis. 05/13/2017 abdominal  sonogram. FINDINGS: Lower chest: No acute abnormality at the lung bases. Hepatobiliary: Normal liver size and configuration. No hepatic steatosis. There are least 5 similar liver masses scattered throughout the liver, all new since 02/20/2017 CT study, each demonstrating T2 hyperintensity, T1 hypointensity and hypoenhancement, largest 1.8 x 1.2 cm in segment 4A of the left liver lobe (series 7/ image 10) and 1.3 x 1.1 cm in segment 6 of the right liver lobe (series 7/image 13). Distended gallbladder with no cholelithiasis, gallbladder wall thickening or pericholecystic fluid. Mild diffuse intrahepatic biliary ductal dilatation, new. Newly dilated common bile duct measuring 9 mm diameter approximately, with abrupt caliber transition of the  common bile duct at the level of the pancreatic neck, with small caliber of the lower third of the common bile duct. No choledocholithiasis. Pancreas: There is a poorly marginated infiltrative-appearing hypoenhancing pancreatic neck mass measuring approximately 4.6 x 2.8 cm (series 1303/ image 64). This mass appears to encase the common hepatic artery and proximal splenic artery. This mass appears to encase and mildly narrow the proximal superior mesenteric artery (series 1304/ image 75). 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 There is mild irregular dilatation of the main pancreatic duct in the pancreatic body and tail up to 4 mm diameter. There is parenchymal atrophy in the pancreatic body and tail. No pancreas divisum. Spleen: Top-normal size spleen (craniocaudal splenic length 12.8 cm. No splenic mass. Adrenals/Urinary Tract: No discrete adrenal nodules. No hydronephrosis. Normal kidneys with no renal mass. Stomach/Bowel: Grossly normal stomach. Visualized small and large bowel is normal caliber, with no bowel wall thickening. Vascular/Lymphatic: Normal caliber abdominal aorta. Patent hepatic and  renal veins. Mildly enlarged 1.1 cm portacaval node (series 1303/image 63) . Other: No abdominal ascites or focal fluid collection. Musculoskeletal: No aggressive appearing focal osseous lesions. IMPRESSION: 1. Infiltrative 4.6 x 2.8 cm hypoenhancing pancreatic neck mass, most compatible with pancreatic adenocarcinoma. Endoscopic ultrasound with FNA recommended for further evaluation. 2. Double duct sign associated with the pancreatic neck mass, with mild intrahepatic biliary ductal dilatation and common bile duct diameter 9 mm. Mild pancreatic duct dilatation and pancreatic atrophy distal to the pancreatic neck mass. 3. Five scattered hypoenhancing liver masses, most compatible with liver metastases. 4. Mild portacaval adenopathy suspicious for nodal metastasis. 5. 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 Encasement of the common hepatic artery, proximal splenic artery and proximal SMA by the mass. These results were called by telephone at the time of interpretation on 05/25/2017 at 8:34 am to Dyanne Carrel, NP, who verbally acknowledged these results. Electronically Signed   By: Ilona Sorrel M.D.   On: 05/25/2017 08:52              Blood pressure (!) 156/87, pulse 60, temperature 98.3 F (36.8 C), temperature source Oral, resp. rate 18, height '5\' 4"'$  (1.626 m), weight 61.7 kg (136 lb), last menstrual period 05/11/2017, SpO2 100 %.  Physical exam:   General--pleasant Carlson and no distress ENT--nonicteric Neck--supple no lymphadenopathy Heart--regular rate and rhythm without murmurs gallops Lungs--clear Abdomen--mild epigastric tenderness Psych--alert and oriented, answers questions appropriately appropriately upset with Crystal Carlson diagnosis   Assessment: 1. Pancreatic cancer metastatic liver. This apparently is very aggressive census was not seen on CT scan just 2 months ago. I have discussed this with the Crystal Carlson and Crystal Carlson is  quite upset with this diagnosis. I think we need to get oncology involve as quickly as possible. [place] with biopsy would be beneficial however, the single physician group that performs this procedure is out of town this week. We may need to do liver biopsy or see if one of the other physicians in town would be willing to attempt EUS with biopsy. Plan: 1. I will go ahead and order CEA and CA 19 9 2. I would get oncology involved as quickly as possible and possibly have IR see the Crystal Carlson to determine if it would be easy to do a liver biopsy or not. We will follow while Crystal Carlson is here.   Zohar Laing JR,Chapin Arduini L 05/25/2017,  1:29 PM   This note was created using voice recognition software and minor errors may Have occurred unintentionally. Pager: [phone] If no answer or after hours call [phone]

## 2017-05-25 NOTE — ED Provider Notes (Signed)
[facility] DEPT Provider Note   CSN: [id number] Arrival date & time: 05/25/17  0102     History   Chief Complaint Chief Complaint  Patient presents with  . Abdominal Pain    HPI Crystal Carlson is a 51 y.o. female.  Patient is a 51 year old female with past medical history of anemia and chronic abdominal pain. She has been experiencing abdominal issues over the past several months which have thus far been unexplained. She has seen a gastroenterologist who feels as though this may be pancreas related. She has had a CT scan which is unremarkable. She presents tonight with pain that is worsening and unrelieved with the hydrocodone that she takes at home. She denies any fevers or chills. She denies any bloody stool or vomit. He tells me her pain has been constant for months and unrelenting.   The history is provided by the patient.  Abdominal Pain   This is a chronic problem. Episode onset: Several months ago. The problem occurs constantly. The problem has been gradually worsening. The pain is associated with eating. The pain is located in the epigastric region and periumbilical region. The quality of the pain is cramping. The pain is severe. Associated symptoms include constipation. Pertinent negatives include fever, melena, nausea, vomiting and dysuria. Nothing aggravates the symptoms. Nothing relieves the symptoms.    Past Medical History:  Diagnosis Date  . Chronic pancreatitis ([place]) 05/19/2017  . Microcytic anemia 05/19/2017  . Prediabetes 2016   when living in [place]    Patient Active Problem List   Diagnosis Date Noted  . Chronic pancreatitis ([place]) 05/19/2017  . Microcytic anemia 05/19/2017  . Prediabetes 08/18/2014    History reviewed. No pertinent surgical history.  OB History    No data available       Home Medications    Prior to Admission medications   Medication Sig Start Date End Date Taking? Authorizing Provider  famotidine (PEPCID) 20 MG tablet 2 tabs  by mouth at bedtime daily 05/08/17   Mack Hook, MD  HYDROcodone-acetaminophen (NORCO/VICODIN) 5-325 MG tablet Take 1-2 tablets by mouth every 6 (six) hours as needed for moderate pain. 05/19/17   Mack Hook, MD  traMADol (ULTRAM) 50 MG tablet Take 1 tablet (50 mg total) by mouth every 6 (six) hours as needed. Patient not taking: Reported on 05/08/2017 02/20/17   Okey Regal, PA-C    Family History Family History  Problem Relation Age of Onset  . Hypertension Mother   . Arthritis Mother   . Diabetes Father   . Heart disease Father        ?valvular problems  . Hyperlipidemia Son   . Diabetes Brother     Social History Social History  Substance Use Topics  . Smoking status: Never Smoker  . Smokeless tobacco: Never Used  . Alcohol use No     Allergies   Patient has no known allergies.   Review of Systems Review of Systems  Constitutional: Negative for fever.  Gastrointestinal: Positive for abdominal pain and constipation. Negative for melena, nausea and vomiting.  Genitourinary: Negative for dysuria.  All other systems reviewed and are negative.    Physical Exam Updated Vital Signs BP (!) 183/97 (BP Location: Right Arm)   Pulse 77   Temp 98.3 F (36.8 C) (Oral)   Resp 18   Ht 5\' 4"  (1.626 m)   Wt 61.7 kg (136 lb)   LMP 05/11/2017   SpO2 100%   BMI 23.34 kg/m   Physical Exam  Constitutional: She is oriented to person, place, and time. She appears well-developed and well-nourished. No distress.  HENT:  Head: Normocephalic and atraumatic.  Neck: Normal range of motion. Neck supple.  Cardiovascular: Normal rate and regular rhythm.  Exam reveals no gallop and no friction rub.   No murmur heard. Pulmonary/Chest: Effort normal and breath sounds normal. No respiratory distress. She has no wheezes.  Abdominal: Soft. Bowel sounds are normal. She exhibits no distension. There is tenderness. There is no rebound and no guarding.  There is tenderness to  palpation in the periumbilical region and epigastric region.  Musculoskeletal: Normal range of motion.  Neurological: She is alert and oriented to person, place, and time.  Skin: Skin is warm and dry. She is not diaphoretic.  Nursing note and vitals reviewed.    ED Treatments / Results  Labs (all labs ordered are listed, but only abnormal results are displayed) Labs Reviewed  COMPREHENSIVE METABOLIC PANEL - Abnormal; Notable for the following:       Result Value   Glucose, Bld 110 (*)    BUN 5 (*)    AST 76 (*)    ALT 75 (*)    All other components within normal limits  URINALYSIS, ROUTINE W REFLEX MICROSCOPIC - Abnormal; Notable for the following:    Color, Urine STRAW (*)    Specific Gravity, Urine 1.004 (*)    Hgb urine dipstick MODERATE (*)    Leukocytes, UA LARGE (*)    Squamous Epithelial / LPF 0-5 (*)    All other components within normal limits  LIPASE, BLOOD  CBC    EKG  EKG Interpretation None       Radiology No results found.  Procedures Procedures (including critical care time)  Medications Ordered in ED Medications  sodium chloride 0.9 % bolus 1,000 mL (not administered)  morphine 4 MG/ML injection 4 mg (not administered)  ondansetron (ZOFRAN) injection 4 mg (not administered)     Initial Impression / Assessment and Plan / ED Course  I have reviewed the triage vital signs and the nursing notes.  Pertinent labs & imaging results that were available during my care of the patient were reviewed by me and considered in my medical decision making (see chart for details).  Patient with a several month history of ongoing abdominal pain. She seen a GI doctor and is awaiting an MRCP. Her pain became substantially worse over the past 2 days and is not improved with her home medications. Laboratory studies today reveal mild elevations of the LFTs, normal lipase, and no elevation of white count. An MRCP was a cane this evening which were reveals unofficially  what appears to be a mass versus focal area of pancreatitis in the neck of the pancreas.  Patient's care discussed with Dr. Blaine Hamper who will admit for pain control and further workup. The imaging studies will be reviewed by the day shift radiologist who will give the final report.  Final Clinical Impressions(s) / ED Diagnoses   Final diagnoses:  None    New Prescriptions New Prescriptions   No medications on file     Veryl Speak, MD 05/25/17 [phone]

## 2017-05-25 NOTE — Progress Notes (Signed)
Crystal Carlson is a 51 y.o. female patient admitted from ED awake, alert - oriented  X 4 - no acute distress noted.  VSS - Blood pressure (!) 156/87, pulse 60, temperature 98.3 F (36.8 C), temperature source Oral, resp. rate 18, height 5\' 4"  (1.626 m), weight 61.7 kg (136 lb), last menstrual period 05/11/2017, SpO2 100 %.    IV in place, occlusive dsg intact without redness.  Orientation to room, and floor completed with information packet given to patient/family.  Patient declined safety video at this time.  Admission INP armband ID verified with patient/family, and in place.   SR up x 2, fall assessment complete, with patient and family able to verbalize understanding of risk associated with falls, and verbalized understanding to call nsg before up out of bed.  Call light within reach, patient able to voice, and demonstrate understanding.  Skin, clean-dry- intact without evidence of bruising, or skin tears.   No evidence of skin break down noted on exam.     Will cont to eval and treat per MD orders.  Betha Loa Petra Sargeant, RN 05/25/2017 12:07 PM

## 2017-05-25 NOTE — ED Notes (Signed)
Pt to MRI via stretcher.

## 2017-05-25 NOTE — Progress Notes (Signed)
Had visit with patient. She was very reserved and did not talk at first.  I believe she was overwhelmed with learning she has cancer.  After praying she shared her concern for her 3 children and husband. She was very thankful for the visit and prayer and asked me to come back. Conard Novak, Chaplain   05/25/17 1500  Clinical Encounter Type  Visited With Patient  Visit Type Initial  Referral From Nurse  Spiritual Encounters  Spiritual Needs Prayer  Stress Factors  Patient Stress Factors Health changes  Family Stress Factors (3 children)

## 2017-05-25 NOTE — ED Notes (Signed)
Attempted report x1. RN stated she is still getting report @ this time and unable to take report. Call back # left w/ Network engineer.

## 2017-05-25 NOTE — Progress Notes (Signed)
This is a no charge note  Pending admission per Dr. Stark Jock.  51 year old lady with a past medical history of chronic pancreatitis, microcytic anemia, GERD, prediabetes, who presents with abdominal pain. Patient has abdominal pain for more than 3 months. No nausea, vomiting or diarrhea. Patient had negative abdominal ultrasound in September. MRCP was done today, initial report showed possible mass in pancreatic neck. Pending formal report.  Patient was found to have WBC 7.0, lipase and 19, positive urinalysis with large amount of leukocytes but no bacteria, electrolytes renal function okay, temperature normal, no tachycardia, oxygen saturation 100% on room air. Patient is admitted to [facility] bed as inpatient.   # CT abdomen/pelvis on 02/20/17 showed: 1. Slight mural thickening and gaseous distention of jejunum consistent with a mild enteritis or potentially reactive secondary to what appears be mild peripancreatic inflammation from an uncomplicated pancreatitis. No mechanical source of obstruction is seen. 2. Corpus luteal cyst of the left ovary measuring 12 mm with trace free fluid in the pelvis, likely physiologic. 3. Mild to moderate central disc bulge L5-S1.    Ivor Costa, MD  [facility] Pager [phone]  If 7PM-7AM, please contact night-coverage [email or web address] Password [email or web address] 05/25/2017, 6:42 AM

## 2017-05-25 NOTE — H&P (Signed)
History and Physical    Crystal Carlson NFA:[id number] DOB: [birth year] DOA: 05/25/2017  PCP: Mack Hook, MD Patient coming from: home  Chief Complaint: intractable/worsening abdominal pain  HPI: Crystal Carlson is a very pleasant 51 y.o. female with medical history significant for chronic pancreatitis, pre-diabetes as the emergency department chief complaint persistent/worsening abdominal pain. Initial evaluation concerning for pancreatitis versus cholecystitis vs pancreatic mass.  Information is obtained from the patient and her husband who is at the bedside as well as chart. Patient reports abdominal pain for 3 months. She states pain is sharp constant and radiates to her back. She's been taking pain medicine for 3 months during that time the amount of relief induration of relief has diminished. Yesterday she reports "pain medicine did not help me at all. She reports the pain a 5 out of 10 at best 9 out of 10 at worst. She denies nausea and vomiting but does endorse some "bloating and constipation" is a 15 pound weight loss in the last 3 months. She states that eating makes it worse as well as lying on her back. She denies headache dizziness fever chills chest pain palpitation shortness of breath. She denies dysuria hematuria frequency or urgency.   Chart review indicates she underwent a CT of the abdomen 3 months ago which showed atrophic appearance of pancreatic body and tail and possible inflammation. She saw her primary care provider who opined possible chronic pancreatitis. She underwent an ultrasound that showed a normal gallbladder and CBD. She underwent MRCP results pending    ED Course: In emergency department she's afebrile blood pressure high end of normal she's not hypoxic. She is provided with IV fluids and morphine.  Review of Systems: As per HPI otherwise all other systems reviewed and are negative.   Ambulatory Status: Ambulates independently and is independent with ADLs  Past  Medical History:  Diagnosis Date  . Chronic pancreatitis ([facility]) 05/19/2017  . Microcytic anemia 05/19/2017  . Prediabetes 2016   when living in [place]    History reviewed. No pertinent surgical history.  Social History   Social History  . Marital status: Married    Spouse name: N/A  . Number of children: 3  . Years of education: college grad--civics--states equivalent to High school here.   Occupational History  . Makes eyeglass lenses for an [employer] History Main Topics  . Smoking status: Never Smoker  . Smokeless tobacco: Never Used  . Alcohol use No  . Drug use: No  . Sexual activity: Not on file   Other Topics Concern  . Not on file   Social History Narrative   Originally from [place]   Moved to [place] In 78   Husband came in 2000   Lives at home with husband, 3 children and mother and father in Sports [employer].    No Known Allergies  Family History  Problem Relation Age of Onset  . Hypertension Mother   . Arthritis Mother   . Diabetes Father   . Heart disease Father        ?valvular problems  . Hyperlipidemia Son   . Diabetes Brother     Prior to Admission medications   Medication Sig Start Date End Date Taking? Authorizing Provider  HYDROcodone-acetaminophen (NORCO/VICODIN) 5-325 MG tablet Take 1-2 tablets by mouth every 6 (six) hours as needed for moderate pain. 05/19/17  Yes Mack Hook, MD  lipase/protease/amylase (CREON) 36000 UNITS CPEP capsule Take 36,000 Units by mouth 3 (three) times daily with  meals.   Yes [provider]    Physical Exam: Vitals:   05/25/17 0111 05/25/17 0645  BP: (!) 183/97 (!) 139/92  Pulse: 77 (!) 58  Resp: 18   Temp: 98.3 F (36.8 C)   TempSrc: Oral   SpO2: 100% 97%  Weight: 61.7 kg (136 lb)   Height: 5\' 4"  (1.626 m)      General:  Appears calm and comfortable No acute distress Eyes:  PERRL, EOMI, normal lids, iris ENT:  grossly normal hearing, lips & tongue, his membranes of her  mouth are moist and pink Neck:  no LAD, masses or thyromegaly Cardiovascular:  RRR, no m/r/g. No LE edema.  Respiratory:  CTA bilaterally, no w/r/r. Normal respiratory effort. Abdomen:  soft, nondistended very sluggish bowel sounds mild to moderate tenderness epigastric area and lower quadrants. Skin:  no rash or induration seen on limited exam Musculoskeletal:  grossly normal tone BUE/BLE, good ROM, no bony abnormality Psychiatric:  grossly normal mood and affect, speech fluent and appropriate, AOx3 Neurologic:  CN 2-12 grossly intact, moves all extremities in coordinated fashion, sensation intact  Labs on Admission: I have personally reviewed following labs and imaging studies  CBC:  Recent Labs Lab 05/25/17 0113  WBC 7.0  HGB 10.5*  HCT 32.8*  MCV 78.7  PLT 563*   Basic Metabolic Panel:  Recent Labs Lab 05/25/17 0113  NA 138  K 3.5  CL 103  CO2 25  GLUCOSE 110*  BUN 5*  CREATININE 0.73  CALCIUM 9.4   GFR: Estimated Creatinine Clearance: 73.5 mL/min (by C-G formula based on SCr of 0.73 mg/dL). Liver Function Tests:  Recent Labs Lab 05/25/17 0113  AST 76*  ALT 75*  ALKPHOS 116  BILITOT 0.6  PROT 7.1  ALBUMIN 3.8    Recent Labs Lab 05/25/17 0113  LIPASE 19   No results for input(s): AMMONIA in the last 168 hours. Coagulation Profile: No results for input(s): INR, PROTIME in the last 168 hours. Cardiac Enzymes: No results for input(s): CKTOTAL, CKMB, CKMBINDEX, TROPONINI in the last 168 hours. BNP (last 3 results) No results for input(s): PROBNP in the last 8760 hours. HbA1C: No results for input(s): HGBA1C in the last 72 hours. CBG: No results for input(s): GLUCAP in the last 168 hours. Lipid Profile: No results for input(s): CHOL, HDL, LDLCALC, TRIG, CHOLHDL, LDLDIRECT in the last 72 hours. Thyroid Function Tests: No results for input(s): TSH, T4TOTAL, FREET4, T3FREE, THYROIDAB in the last 72 hours. Anemia Panel: No results for input(s):  VITAMINB12, FOLATE, FERRITIN, TIBC, IRON, RETICCTPCT in the last 72 hours. Urine analysis:    Component Value Date/Time   COLORURINE STRAW (A) 05/25/2017 0125   APPEARANCEUR CLEAR 05/25/2017 0125   LABSPEC 1.004 (L) 05/25/2017 0125   PHURINE 6.0 05/25/2017 0125   GLUCOSEU NEGATIVE 05/25/2017 0125   HGBUR MODERATE (A) 05/25/2017 0125   BILIRUBINUR NEGATIVE 05/25/2017 0125   KETONESUR NEGATIVE 05/25/2017 0125   PROTEINUR NEGATIVE 05/25/2017 0125   NITRITE NEGATIVE 05/25/2017 0125   LEUKOCYTESUR LARGE (A) 05/25/2017 0125    Creatinine Clearance: Estimated Creatinine Clearance: 73.5 mL/min (by C-G formula based on SCr of 0.73 mg/dL).  Sepsis Labs: @LABRCNTIP (procalcitonin:4,lacticidven:4) )No results found for this or any previous visit (from the past 240 hour(s)).   Radiological Exams on Admission: No results found.  EKG:   Assessment/Plan Principal Problem:   Intractable abdominal pain Active Problems:   Chronic pancreatitis ([facility])   Microcytic anemia   HTN (hypertension)   Ovarian cyst  Elevated LFTs   #1. Abdominal pain. Persistent/worsening. Etiology unclear. Previous CT possible pancreatitis. Lipase within the limits of normal. Triglycerides within the limits of normal. No history of EtOH. LFTs have trended up since onset of pain 3 months ago. 15 pound weight loss as eating makes pain worse. Leukocytosis she is afebrile nontoxic appearing. Recently saw GI who started Creon.  Initially pain medicine relieved pain but no longer. -Admit to MedSurg bed -Vigorous IV fluids -Bowel rest -Follow MRCP -Trend LFT -Consider GI and or general surgery consult pending results of MRI  #2. Hypertension. No history of same. Likely related to #1. -Monitor closely -When necessary hydralazine  #3. Microcytic anemia. Hemoglobin 10.5. No signs symptoms of active bleeding -Monitor -Outpatient follow-up  #4. Ovarian cyst. Per previous CT.  -Outpatient follow-up  #5. Elevated LFTs.  LFTs have trended up for the last 3 months. AST 76 ALT 75 alkaline phosphatase 116. Total bilirubin within the limits of normal -see above    DVT prophylaxis: scd Code Status: full  Family Communication:  Husband at bedside Disposition Plan: home  Consults called:   Admission status: inpatient    Radene Gunning MD [facility]  If 7PM-7AM, please contact night-coverage [email or web address] Password [email or web address]  05/25/2017, 7:37 AM

## 2017-05-25 NOTE — Consult Note (Signed)
Chief Complaint: Patient was seen in consultation today for liver mass biopsy; or possible separate site Chief Complaint  Patient presents with  . Abdominal Pain   at the request of Dr Alycia Rossetti  Referring Physician(s): Dr Alycia Rossetti Dr Darrold Junker  Supervising Physician: Corrie Mckusick  Patient Status: [facility] - In-pt  History of Present Illness: Crystal Carlson is a 51 y.o. female   abd pain for few months Worsening pain Wt loss CT 02/2017: revealed pancreatitis No other findings Blood work wnl  Presented again to ED this am Worse pain; Nausea New findings on MR today: IMPRESSION: 1. Infiltrative 4.6 x 2.8 cm hypoenhancing pancreatic neck mass, most compatible with pancreatic adenocarcinoma. Endoscopic ultrasound with FNA recommended for further evaluation. 2. Double duct sign associated with the pancreatic neck mass, with mild intrahepatic biliary ductal dilatation and common bile duct diameter 9 mm. Mild pancreatic duct dilatation and pancreatic atrophy distal to the pancreatic neck mass. 3. Five scattered hypoenhancing liver masses, most compatible with liver metastases. 4. Mild portacaval adenopathy suspicious for nodal metastasis. 5. 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 Encasement of the common hepatic artery, proximal splenic artery and proximal SMA by the mass.  Request now for liver mass biopsy per Dr Oletta Lamas Dr Benay Spice seeing pt now Dr Earleen Newport has reviewed imaging and approves liver lesion biopsy---although these lesions are not seen easily. Rec: CT Chest----being done this evening. Will review this imaging with Rad in am----determine site of biopsy   Past Medical History:  Diagnosis Date  . Chronic pancreatitis ([place]) 05/19/2017  . Microcytic anemia 05/19/2017  . Prediabetes 2016   when living in [place]    History reviewed. No pertinent surgical history.  Allergies: Patient  has no known allergies.  Medications: Prior to Admission medications   Medication Sig Start Date End Date Taking? Authorizing Provider  HYDROcodone-acetaminophen (NORCO/VICODIN) 5-325 MG tablet Take 1-2 tablets by mouth every 6 (six) hours as needed for moderate pain. 05/19/17  Yes Mack Hook, MD  lipase/protease/amylase (CREON) 36000 UNITS CPEP capsule Take 36,000 Units by mouth 3 (three) times daily with meals.   Yes [provider]     Family History  Problem Relation Age of Onset  . Hypertension Mother   . Arthritis Mother   . Diabetes Father   . Heart disease Father        ?valvular problems  . Hyperlipidemia Son   . Diabetes Brother     Social History   Social History  . Marital status: Married    Spouse name: N/A  . Number of children: 3  . Years of education: college grad--civics--states equivalent to High school here.   Occupational History  . Makes eyeglass lenses for an [employer] History Main Topics  . Smoking status: Never Smoker  . Smokeless tobacco: Never Used  . Alcohol use No  . Drug use: No  . Sexual activity: Not Asked   Other Topics Concern  . None   Social History Narrative   Originally from [place]   Moved to [place] In 89   Husband came in 2000   Lives at home with husband, 3 children and mother and father in Sports [employer].    Review of Systems: A 12 point ROS discussed and pertinent positives are indicated in the HPI above.  All other systems are negative.  Review of Systems  Constitutional: Positive for activity change, appetite change,  fatigue and unexpected weight change.  Respiratory: Negative for shortness of breath.   Gastrointestinal: Positive for abdominal pain and nausea.  Musculoskeletal: Negative for back pain.  Neurological: Positive for weakness.  Psychiatric/Behavioral: Negative for behavioral problems and confusion.    Vital Signs: BP (!) 156/87 (BP Location: Left Arm)   Pulse 60   Temp 98.3 F  (36.8 C) (Oral)   Resp 18   Ht 5\' 4"  (1.626 m)   Wt 136 lb (61.7 kg)   LMP 05/11/2017   SpO2 100%   BMI 23.34 kg/m   Physical Exam  Constitutional: She is oriented to person, place, and time.  Cardiovascular: Normal rate, regular rhythm and normal heart sounds.   Pulmonary/Chest: Effort normal and breath sounds normal.  Abdominal: Soft. Bowel sounds are normal.  Musculoskeletal: Normal range of motion.  Neurological: She is alert and oriented to person, place, and time.  Skin: Skin is warm and dry.  Psychiatric: She has a normal mood and affect. Her behavior is normal. Judgment and thought content normal.  Nursing note and vitals reviewed.   Imaging: US Abdomen Complete  Result Date: 05/13/2017 CLINICAL DATA:  Epigastric abdominal pain. EXAM: ABDOMEN ULTRASOUND COMPLETE COMPARISON:  CT scan of February 20, 2017. FINDINGS: Gallbladder: No gallstones or wall thickening visualized. No sonographic Murphy sign noted by sonographer. Common bile duct: Diameter: 4 mm which is within normal limits. Liver: No focal lesion identified. Within normal limits in parenchymal echogenicity. Portal vein is patent on color Doppler imaging with normal direction of blood flow towards the liver. IVC: No abnormality visualized. Pancreas: Not visualized due to overlying bowel gas. Spleen: Size and appearance within normal limits. Right Kidney: Length: 10 cm. Echogenicity within normal limits. No mass or hydronephrosis visualized. Left Kidney: Length: 10.9 cm. Echogenicity within normal limits. No mass or hydronephrosis visualized. Abdominal aorta: No aneurysm visualized. Other findings: None. IMPRESSION: Pancreas not visualized due to overlying bowel gas. No definite abnormality seen in the abdomen. Electronically Signed   By: Marijo Conception, M.D.   On: 05/13/2017 09:14   Mr Abdomen Mrcp Moise Boring Contast  Result Date: 05/25/2017 CLINICAL DATA:  Severe epigastric abdominal pain for 2 weeks. Elevated liver function tests.  EXAM: MRI ABDOMEN WITHOUT AND WITH CONTRAST (INCLUDING MRCP) TECHNIQUE: Multiplanar multisequence MR imaging of the abdomen was performed both before and after the administration of intravenous contrast. Heavily T2-weighted images of the biliary and pancreatic ducts were obtained, and three-dimensional MRCP images were rendered by post processing. CONTRAST:  62mL MULTIHANCE GADOBENATE DIMEGLUMINE 529 MG/ML IV SOLN COMPARISON:  02/20/2017 CT abdomen/pelvis. 05/13/2017 abdominal sonogram. FINDINGS: Lower chest: No acute abnormality at the lung bases. Hepatobiliary: Normal liver size and configuration. No hepatic steatosis. There are least 5 similar liver masses scattered throughout the liver, all new since 02/20/2017 CT study, each demonstrating T2 hyperintensity, T1 hypointensity and hypoenhancement, largest 1.8 x 1.2 cm in segment 4A of the left liver lobe (series 7/ image 10) and 1.3 x 1.1 cm in segment 6 of the right liver lobe (series 7/image 13). Distended gallbladder with no cholelithiasis, gallbladder wall thickening or pericholecystic fluid. Mild diffuse intrahepatic biliary ductal dilatation, new. Newly dilated common bile duct measuring 9 mm diameter approximately, with abrupt caliber transition of the common bile duct at the level of the pancreatic neck, with small caliber of the lower third of the common bile duct. No choledocholithiasis. Pancreas: There is a poorly marginated infiltrative-appearing hypoenhancing pancreatic neck mass measuring approximately 4.6 x 2.8 cm (series 1303/  image 64). This mass appears to encase the common hepatic artery and proximal splenic artery. This mass appears to encase and mildly narrow the proximal superior mesenteric artery (series 1304/ image 75). 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 There is mild irregular dilatation of the main pancreatic duct in the pancreatic body and tail up to 4  mm diameter. There is parenchymal atrophy in the pancreatic body and tail. No pancreas divisum. Spleen: Top-normal size spleen (craniocaudal splenic length 12.8 cm. No splenic mass. Adrenals/Urinary Tract: No discrete adrenal nodules. No hydronephrosis. Normal kidneys with no renal mass. Stomach/Bowel: Grossly normal stomach. Visualized small and large bowel is normal caliber, with no bowel wall thickening. Vascular/Lymphatic: Normal caliber abdominal aorta. Patent hepatic and renal veins. Mildly enlarged 1.1 cm portacaval node (series 1303/image 63) . Other: No abdominal ascites or focal fluid collection. Musculoskeletal: No aggressive appearing focal osseous lesions. IMPRESSION: 1. Infiltrative 4.6 x 2.8 cm hypoenhancing pancreatic neck mass, most compatible with pancreatic adenocarcinoma. Endoscopic ultrasound with FNA recommended for further evaluation. 2. Double duct sign associated with the pancreatic neck mass, with mild intrahepatic biliary ductal dilatation and common bile duct diameter 9 mm. Mild pancreatic duct dilatation and pancreatic atrophy distal to the pancreatic neck mass. 3. Five scattered hypoenhancing liver masses, most compatible with liver metastases. 4. Mild portacaval adenopathy suspicious for nodal metastasis. 5. 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 Encasement of the common hepatic artery, proximal splenic artery and proximal SMA by the mass. These results were called by telephone at the time of interpretation on 05/25/2017 at 8:34 am to Dyanne Carrel, NP, who verbally acknowledged these results. Electronically Signed   By: Ilona Sorrel M.D.   On: 05/25/2017 08:52    Labs:  CBC:  Recent Labs  02/20/17 1142 05/08/17 1557 05/25/17 0113  WBC 5.5 4.7 7.0  HGB 11.8* 9.9* 10.5*  HCT 37.8 31.4* 32.8*  PLT 140* 117* 115*    COAGS: No results for input(s): INR, APTT in the last 8760 hours.  BMP:  Recent  Labs  02/20/17 1142 05/08/17 1557 05/25/17 0113  NA 137 142 138  K 3.6 3.7 3.5  CL 106 102 103  CO2 22 24 25   GLUCOSE 126* 99 110*  BUN 10 15 5*  CALCIUM 9.2 9.8 9.4  CREATININE 0.90 0.79 0.73  GFRNONAA >60 88 >60  GFRAA >60 102 >60    LIVER FUNCTION TESTS:  Recent Labs  02/20/17 1142 05/08/17 1557 05/25/17 0113  BILITOT 0.7 0.3 0.6  AST 22 23 76*  ALT 18 19 75*  ALKPHOS 56 65 116  PROT 7.1 7.0 7.1  ALBUMIN 3.9 4.3 3.8    TUMOR MARKERS: No results for input(s): AFPTM, CEA, CA199, CHROMGRNA in the last 8760 hours.  Assessment and Plan:  Worsening abd pain x months New pancreatic mass and liver lesions on MR CT Chest ordered for this pm Scheduled for biopsy of possible liver lesion vs different site (awaiting CT Chest to determine best site for best yield) Risks and benefits discussed with the patient including, but not limited to bleeding, infection, damage to adjacent structures or low yield requiring additional tests. All of the patient's questions were answered, patient is agreeable to proceed. Consent signed and in chart.  Thank you for this interesting consult.  I greatly enjoyed meeting Crystal Carlson and look forward to  participating in their care.  A copy of this report was sent to the requesting provider on this date.  Electronically Signed: Lavonia Drafts, PA-C 05/25/2017, 3:22 PM   I spent a total of 40 Minutes    in face to face in clinical consultation, greater than 50% of which was counseling/coordinating care for liver lesion biopsy; possible different site

## 2017-05-26 ENCOUNTER — Inpatient Hospital Stay (HOSPITAL_COMMUNITY): Payer: Medicaid Other

## 2017-05-26 DIAGNOSIS — C259 Malignant neoplasm of pancreas, unspecified: Secondary | ICD-10-CM

## 2017-05-26 DIAGNOSIS — R109 Unspecified abdominal pain: Secondary | ICD-10-CM | POA: Diagnosis not present

## 2017-05-26 DIAGNOSIS — C787 Secondary malignant neoplasm of liver and intrahepatic bile duct: Secondary | ICD-10-CM

## 2017-05-26 HISTORY — DX: Secondary malignant neoplasm of liver and intrahepatic bile duct: C78.7

## 2017-05-26 HISTORY — DX: Malignant neoplasm of pancreas, unspecified: C25.9

## 2017-05-26 LAB — BASIC METABOLIC PANEL
Anion gap: 9 (ref 5–15)
CALCIUM: 8.5 mg/dL — AB (ref 8.9–10.3)
CO2: 26 mmol/L (ref 22–32)
CREATININE: 0.75 mg/dL (ref 0.44–1.00)
Chloride: 102 mmol/L (ref 101–111)
GFR calc Af Amer: >60 mL/min (ref >60)
Glucose, Bld: 92 mg/dL (ref 65–99)
POTASSIUM: 3.2 mmol/L — AB (ref 3.5–5.1)
SODIUM: 137 mmol/L (ref 135–145)

## 2017-05-26 LAB — CBC
HCT: 31.4 % — ABNORMAL LOW (ref 36.0–46.0)
Hemoglobin: 9.6 g/dL — ABNORMAL LOW (ref 12.0–15.0)
MCH: 24.6 pg — ABNORMAL LOW (ref 26.0–34.0)
MCHC: 30.6 g/dL (ref 30.0–36.0)
MCV: 80.3 fL (ref 78.0–100.0)
PLATELETS: 99 10*3/uL — AB (ref 150–400)
RBC: 3.91 MIL/uL (ref 3.87–5.11)
RDW: 15.5 % (ref 11.5–15.5)
WBC: 7.2 10*3/uL (ref 4.0–10.5)

## 2017-05-26 LAB — HEPATIC FUNCTION PANEL
ALBUMIN: 3.4 g/dL — AB (ref 3.5–5.0)
ALT: 55 U/L — ABNORMAL HIGH (ref 14–54)
AST: 42 U/L — AB (ref 15–41)
Alkaline Phosphatase: 98 U/L (ref 38–126)
Bilirubin, Direct: 0.2 mg/dL (ref 0.1–0.5)
Indirect Bilirubin: 0.6 mg/dL (ref 0.3–0.9)
TOTAL PROTEIN: 6.5 g/dL (ref 6.5–8.1)
Total Bilirubin: 0.8 mg/dL (ref 0.3–1.2)

## 2017-05-26 LAB — APTT: APTT: 32 s (ref 24–36)

## 2017-05-26 LAB — PROTIME-INR
INR: 1.18
PROTHROMBIN TIME: 14.9 s (ref 11.4–15.2)

## 2017-05-26 LAB — CANCER ANTIGEN 19-9: CA 19-9: 473 U/mL — ABNORMAL HIGH (ref 0–35)

## 2017-05-26 LAB — CEA: CEA: 1.3 ng/mL (ref 0.0–4.7)

## 2017-05-26 MED ORDER — FENTANYL CITRATE (PF) 100 MCG/2ML IJ SOLN
INTRAMUSCULAR | Status: AC | PRN
  Administered 2017-05-26: 50 ug via INTRAVENOUS
  Administered 2017-05-26 (×2): 25 ug via INTRAVENOUS

## 2017-05-26 MED ORDER — MIDAZOLAM HCL 2 MG/2ML IJ SOLN
INTRAMUSCULAR | Status: AC | PRN
  Administered 2017-05-26: 1 mg via INTRAVENOUS
  Administered 2017-05-26 (×2): 0.5 mg via INTRAVENOUS

## 2017-05-26 MED ORDER — ENSURE ENLIVE PO LIQD
237.0000 mL | Freq: Two times a day (BID) | ORAL | Status: DC
  Administered 2017-05-27: 237 mL via ORAL

## 2017-05-26 MED ORDER — POTASSIUM CHLORIDE CRYS ER 20 MEQ PO TBCR
40.0000 meq | EXTENDED_RELEASE_TABLET | ORAL | Status: AC
  Administered 2017-05-26 (×2): 40 meq via ORAL
  Filled 2017-05-26 (×2): qty 2

## 2017-05-26 MED ORDER — FENTANYL CITRATE (PF) 100 MCG/2ML IJ SOLN
INTRAMUSCULAR | Status: AC
  Filled 2017-05-26: qty 2

## 2017-05-26 MED ORDER — MIDAZOLAM HCL 2 MG/2ML IJ SOLN
INTRAMUSCULAR | Status: AC
  Filled 2017-05-26: qty 2

## 2017-05-26 MED ORDER — LIDOCAINE HCL (PF) 1 % IJ SOLN
INTRAMUSCULAR | Status: AC
  Filled 2017-05-26: qty 30

## 2017-05-26 NOTE — Progress Notes (Signed)
IP PROGRESS NOTE  Subjective:   She reports better pain control with the oxycodone.  Objective: Vital signs in last 24 hours: Blood pressure 128/87, pulse 74, temperature 98.4 F (36.9 C), temperature source Oral, resp. rate 12, height 5\' 4"  (1.626 m), weight 136 lb (61.7 kg), last menstrual period 05/11/2017, SpO2 95 %.  Intake/Output from previous day: 10/08 0701 - 10/09 0700 In: 3239.6 [I.V.:2239.6; IV Piggyback:1000] Out: -   Physical Exam: Not performed today    Lab Results:  Recent Labs  05/25/17 0113 05/26/17 0606  WBC 7.0 7.2  HGB 10.5* 9.6*  HCT 32.8* 31.4*  PLT 115* 99*    BMET  Recent Labs  05/25/17 0113 05/26/17 0606  NA 138 137  K 3.5 3.2*  CL 103 102  CO2 25 26  GLUCOSE 110* 92  BUN 5* <5*  CREATININE 0.73 0.75  CALCIUM 9.4 8.5*   CA 19-9 on 05/25/2017: 473 Studies/Results: Ct Chest W Contrast  Result Date: 05/25/2017 CLINICAL DATA:  Pancreatic adenocarcinoma with suspected hepatic metastatic disease. EXAM: CT CHEST WITH CONTRAST TECHNIQUE: Multidetector CT imaging of the chest was performed during intravenous contrast administration. CONTRAST:  28mL ISOVUE-300 IOPAMIDOL (ISOVUE-300) INJECTION 61% COMPARISON:  MRI of the abdomen 05/25/2017 FINDINGS: Cardiovascular: No significant vascular findings. Normal heart size. No pericardial effusion. Mediastinum/Nodes: No enlarged mediastinal, hilar, or axillary lymph nodes. Thyroid gland, trachea, and esophagus demonstrate no significant findings. Lungs/Pleura: Lungs are clear. No pleural effusion or pneumothorax. 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 Portacaval lymphadenopathy. Heterogeneous appearance of the liver, likely due to areas of hypoperfusion. Suspected metastatic lesions seen on recent prior MRI and not well evaluated on the current study.  Musculoskeletal: No chest wall abnormality. No acute or significant osseous findings. IMPRESSION: No evidence of metastatic disease to the thorax. Infiltrative pancreatic neck mass better visualized on MRI of the abdomen from the same date. Heterogeneous appearance of the liver, likely due to areas of hypoperfusion. Suspected hepatic metastatic lesions seen on recent MRI are not well visualized on the current study. Electronically Signed   By: Fidela Salisbury M.D.   On: 05/25/2017 21:27   Mr Abdomen Mrcp Moise Boring Contast  Result Date: 05/25/2017 CLINICAL DATA:  Severe epigastric abdominal pain for 2 weeks. Elevated liver function tests. EXAM: MRI ABDOMEN WITHOUT AND WITH CONTRAST (INCLUDING MRCP) TECHNIQUE: Multiplanar multisequence MR imaging of the abdomen was performed both before and after the administration of intravenous contrast. Heavily T2-weighted images of the biliary and pancreatic ducts were obtained, and three-dimensional MRCP images were rendered by post processing. CONTRAST:  62mL MULTIHANCE GADOBENATE DIMEGLUMINE 529 MG/ML IV SOLN COMPARISON:  02/20/2017 CT abdomen/pelvis. 05/13/2017 abdominal sonogram. FINDINGS: Lower chest: No acute abnormality at the lung bases. Hepatobiliary: Normal liver size and configuration. No hepatic steatosis. There are least 5 similar liver masses scattered throughout the liver, all new since 02/20/2017 CT study, each demonstrating T2 hyperintensity, T1 hypointensity and hypoenhancement, largest 1.8 x 1.2 cm in segment 4A of the left liver lobe (series 7/ image 10) and 1.3 x 1.1 cm in segment 6 of the right liver lobe (series 7/image 13). Distended gallbladder with no cholelithiasis, gallbladder wall thickening or pericholecystic fluid. Mild diffuse intrahepatic biliary ductal dilatation, new. Newly dilated common bile duct measuring 9 mm diameter approximately, with abrupt caliber transition of the common bile duct at the level of the pancreatic neck, with  small  caliber of the lower third of the common bile duct. No choledocholithiasis. Pancreas: There is a poorly marginated infiltrative-appearing hypoenhancing pancreatic neck mass measuring approximately 4.6 x 2.8 cm (series 1303/ image 64). This mass appears to encase the common hepatic artery and proximal splenic artery. This mass appears to encase and mildly narrow the proximal superior mesenteric artery (series 1304/ image 75). 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 There is mild irregular dilatation of the main pancreatic duct in the pancreatic body and tail up to 4 mm diameter. There is parenchymal atrophy in the pancreatic body and tail. No pancreas divisum. Spleen: Top-normal size spleen (craniocaudal splenic length 12.8 cm. No splenic mass. Adrenals/Urinary Tract: No discrete adrenal nodules. No hydronephrosis. Normal kidneys with no renal mass. Stomach/Bowel: Grossly normal stomach. Visualized small and large bowel is normal caliber, with no bowel wall thickening. Vascular/Lymphatic: Normal caliber abdominal aorta. Patent hepatic and renal veins. Mildly enlarged 1.1 cm portacaval node (series 1303/image 63) . Other: No abdominal ascites or focal fluid collection. Musculoskeletal: No aggressive appearing focal osseous lesions. IMPRESSION: 1. Infiltrative 4.6 x 2.8 cm hypoenhancing pancreatic neck mass, most compatible with pancreatic adenocarcinoma. Endoscopic ultrasound with FNA recommended for further evaluation. 2. Double duct sign associated with the pancreatic neck mass, with mild intrahepatic biliary ductal dilatation and common bile duct diameter 9 mm. Mild pancreatic duct dilatation and pancreatic atrophy distal to the pancreatic neck mass. 3. Five scattered hypoenhancing liver masses, most compatible with liver metastases. 4. Mild portacaval adenopathy suspicious for nodal metastasis. 5. Occlusion of the main portal vein  and splenic vein by the mass with cavernous transformation of the portal vein. Encasement of the common hepatic artery, proximal splenic artery and proximal SMA by the mass. These results were called by telephone at the time of interpretation on 05/25/2017 at 8:34 am to Dyanne Carrel, NP, who verbally acknowledged these results. Electronically Signed   By: Ilona Sorrel M.D.   On: 05/25/2017 08:52    Medications: I have reviewed the patient's current medications.  Assessment/Plan:  1. 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 Pain secondary to #1  3. Weight loss  4.  Microcytic anemia   She appears unchanged. The pain is better relieved with oxycodone. She is scheduled to undergo a liver biopsy today. If the liver lesions cannot be seen by ultrasound she appears to be a candidate for an EUS biopsy.  I will follow-up on the biopsy result and discussed treatment options with Crystal Carlson and her family over the next few days.    LOS: 1 day   Donneta Romberg, MD   05/26/2017, 2:15 PM

## 2017-05-26 NOTE — Progress Notes (Addendum)
[facility] Gastroenterology Progress Note  Crystal Carlson 51 y.o. 10/17/1965  CC:  Metastatic pancreatic cancer   Subjective: she just came back to room from liver biopsy. Denied any acute symptoms.  ROS : negative for chest pain and shortness of breath. Positive for weight loss.  Objective: Vital signs in last 24 hours: Vitals:   05/26/17 1228 05/26/17 1232  BP: 119/86 128/87  Pulse: 74 74  Resp: 10 12  Temp:    SpO2: 100% 95%    Physical Exam:  General:  Alert, cooperative, no distress, appears stated age  Head:  Normocephalic, without obvious abnormality, atraumatic  Eyes:  , EOM's intact,   Lungs:   Clear to auscultation bilaterally, respirations unlabored  Heart:  Regular rate and rhythm, S1, S2 normal  Abdomen:   Soft, mild epigastric discomfort bowel sounds active all four quadrants,  no masses,   Extremities: Extremities normal, atraumatic, no  edema  Pulses: 2+ and symmetric    Lab Results:  Recent Labs  05/25/17 0113 05/26/17 0606  NA 138 137  K 3.5 3.2*  CL 103 102  CO2 25 26  GLUCOSE 110* 92  BUN 5* <5*  CREATININE 0.73 0.75  CALCIUM 9.4 8.5*    Recent Labs  05/25/17 0113 05/26/17 0606  AST 76* 42*  ALT 75* 55*  ALKPHOS 116 98  BILITOT 0.6 0.8  PROT 7.1 6.5  ALBUMIN 3.8 3.4*    Recent Labs  05/25/17 0113 05/26/17 0606  WBC 7.0 7.2  HGB 10.5* 9.6*  HCT 32.8* 31.4*  MCV 78.7 80.3  PLT 115* 99*    Recent Labs  05/26/17 0606  LABPROT 14.9  INR 1.18      Assessment/Plan: - pancreatic neck mass with vascular involvement as well as liver lesions.Patient also has weight loss. - Abdominal pain. Improving - Elevated AST/ALT. Normal T bili and alkaline phosphatase. - anemia and thrombocytopenia  Recommendations ------------------------- - follow liver biopsy results.  CA-19-9 is elevated. Normal bilirubin. - Long discussion with patient as well as husband. Multiple questions answered.etiology of pancreatic cancer as well as pancreatitis  discussed. Unfortunately, she is not a candidate for surgical intervention. - she is currently on regular diet. - No further inpatient workup from GI standpoint. - GI will sign off. Call us back if needed   Otis Brace MD, FACP 05/26/2017, 2:16 PM  Pager [phone]  If no answer or after 5 PM call [phone]

## 2017-05-26 NOTE — Sedation Documentation (Signed)
Report given to Sam, floor RN

## 2017-05-26 NOTE — Progress Notes (Signed)
Initial Nutrition Assessment  DOCUMENTATION CODES:   Not applicable  INTERVENTION:   -Ensure Enlive po BID, each supplement provides 350 kcal and 20 grams of protein  NUTRITION DIAGNOSIS:   Increased nutrient needs related to catabolic illness (pancreatic mass with liver lesions, likely pancreatic cancer) as evidenced by estimated needs.  GOAL:   Patient will meet greater than or equal to 90% of their needs  MONITOR:   PO intake, Supplement acceptance, Labs, Weight trends, Skin, I & O's  REASON FOR ASSESSMENT:   Malnutrition Screening Tool    ASSESSMENT:   Crystal Carlson is a 51 y.o. female who presents with probable pancreatic mass w/ hepatic metastasis concerning for malignancy.  Pt admitted with pancreatic mass with liver lesions, likely pancreatic cancer.   Pt just advanced to a regular diet; just underwent liver lesion biospy today. Pt very lethargic at time of visit. Per staff, pt has been flat due to news of potential prognosis.   Reviewed wt hx; noted pt has experienced a 13# (8.7%) wt loss over the past 3 months, which is significant for time frame.   Unable to complete Nutrition-Focused physical exam at this time.   Labs reviewed: K: 3.2 (on PO supplementation).   Diet Order:  Diet regular Room service appropriate? Yes; Fluid consistency: Thin  Skin:  Reviewed, no issues  Last BM:  05/24/17  Height:   Ht Readings from Last 1 Encounters:  05/25/17 5\' 4"  (1.626 m)    Weight:   Wt Readings from Last 1 Encounters:  05/25/17 136 lb (61.7 kg)    Ideal Body Weight:  54.5 kg  BMI:  Body mass index is 23.34 kg/m.  Estimated Nutritional Needs:   Kcal:  1850-2050  Protein:  95-110 grams  Fluid:  1.8-2.0 L  EDUCATION NEEDS:   Education needs no appropriate at this time  Markies Mowatt A. Jimmye Norman, RD, LDN, CDE Pager: [phone] After hours Pager: [phone]

## 2017-05-26 NOTE — Progress Notes (Signed)
Patients blood pressure was 175/84, order parameters not met for PRN medication, provider notified as she has been high most of the day.

## 2017-05-26 NOTE — Procedures (Signed)
Interventional Radiology Procedure Note  Procedure: US guided core biopsy of liver lesion  Complications: None  Estimated Blood Loss: < 10 mL  18 G core biopsy x 2 via 17 G needle at level of roughly 1.8 cm lesion at juncture of right and left lobes.    Venetia Night. Kathlene Cote, M.D Pager:  [phone]

## 2017-05-26 NOTE — Sedation Documentation (Signed)
Vital signs stable. 

## 2017-05-26 NOTE — Sedation Documentation (Signed)
Biopsies taken °

## 2017-05-26 NOTE — Progress Notes (Signed)
PROGRESS NOTE [facility]   Crystal Carlson   WIO:[id number] DOB: 1965-11-17  DOA: 05/25/2017 PCP: Mack Hook, MD   Brief Narrative:  Crystal Carlson 51 year old female with no significant past medical history presented to the emergency department complaining of severe abdominal pain.Upon ED evaluation MRCP was suspicious for a pancreatic mass, and liver lesions compatible with metastasis. She had a CT of the abdomen 3 months ago which did not show any mass, only signs of possible pancreatitis. GI was consulted recommending, CEA and CA-19-9. Oncology was consulted. Liver biopsy was ordered.   Subjective: Patient seen and examined, pain is ok controled with pain oxycodone. No other concerns   Assessment & Plan: Pancreas mass/Liver lesions - cause of abdominal pain  Concern for pancreatic adenocarcinoma, CA-19-9 elevated For liver biopsy - IR appreciated  Control pain as needed CT chest with no mets  Oncology recommendation appreciated   Microcytic anemia/Thrombocytopenia  Like from malignancy  Monitor Hgb slight drop, hemodilution  Check CBC in AM   Hypokalemia  Replete  Check K and Mag   DVT prophylaxis: SCD's  Code Status: Full code  Family Communication: Husband at bedside  Disposition Plan: Home in am if continues to be stable   Consultants:   GI   Oncology   IR   Procedures:   Liver biopsy 10/8  Antimicrobials: Anti-infectives    None       Objective: Vitals:   05/26/17 1330 05/26/17 1345 05/26/17 1400 05/26/17 1415  BP: (!) 142/81 (!) 150/120 (!) 163/83 (!) 146/82  Pulse: 66 83 75 79  Resp:      Temp:      TempSrc:      SpO2: 97%     Weight:      Height:        Intake/Output Summary (Last 24 hours) at 05/26/17 1637 Last data filed at 05/26/17 1347  Gross per 24 hour  Intake          2239.59 ml  Output             1500 ml  Net           739.59 ml   Filed Weights   05/25/17 0111  Weight: 61.7 kg (136 lb)     Examination:  General exam: Appears calm and comfortable  HEENT: AC/AT, PERRLA, OP moist and clear Respiratory system: Clear to auscultation. No wheezes,crackle or rhonchi Cardiovascular system: S1 & S2 heard, RRR. No JVD, murmurs, rubs or gallops Gastrointestinal system: Abdomen is nondistended, soft and mild epigastric tenderness. No organomegaly or masses felt. Normal bowel sounds heard. Central nervous system: Alert and oriented. No focal neurological deficits. Extremities: No pedal edema. Symmetric, strength 5/5   Skin: No rashes, lesions or ulcers Psychiatry: Judgement and insight appear normal. Mood & affect appropriate.   Data Reviewed: I have personally reviewed following labs and imaging studies  CBC:  Recent Labs Lab 05/25/17 0113 05/26/17 0606  WBC 7.0 7.2  HGB 10.5* 9.6*  HCT 32.8* 31.4*  MCV 78.7 80.3  PLT 115* 99*   Basic Metabolic Panel:  Recent Labs Lab 05/25/17 0113 05/26/17 0606  NA 138 137  K 3.5 3.2*  CL 103 102  CO2 25 26  GLUCOSE 110* 92  BUN 5* <5*  CREATININE 0.73 0.75  CALCIUM 9.4 8.5*   GFR: Estimated Creatinine Clearance: 73.5 mL/min (by C-G formula based on SCr of 0.75 mg/dL). Liver Function Tests:  Recent Labs Lab 05/25/17 0113 05/26/17 0606  AST 76*  42*  ALT 75* 55*  ALKPHOS 116 98  BILITOT 0.6 0.8  PROT 7.1 6.5  ALBUMIN 3.8 3.4*    Recent Labs Lab 05/25/17 0113  LIPASE 19   No results for input(s): AMMONIA in the last 168 hours. Coagulation Profile:  Recent Labs Lab 05/26/17 0606  INR 1.18   Cardiac Enzymes: No results for input(s): CKTOTAL, CKMB, CKMBINDEX, TROPONINI in the last 168 hours. BNP (last 3 results) No results for input(s): PROBNP in the last 8760 hours. HbA1C: No results for input(s): HGBA1C in the last 72 hours. CBG: No results for input(s): GLUCAP in the last 168 hours. Lipid Profile: No results for input(s): CHOL, HDL, LDLCALC, TRIG, CHOLHDL, LDLDIRECT in the last 72 hours. Thyroid  Function Tests: No results for input(s): TSH, T4TOTAL, FREET4, T3FREE, THYROIDAB in the last 72 hours. Anemia Panel: No results for input(s): VITAMINB12, FOLATE, FERRITIN, TIBC, IRON, RETICCTPCT in the last 72 hours. Sepsis Labs: No results for input(s): PROCALCITON, LATICACIDVEN in the last 168 hours.  No results found for this or any previous visit (from the past 240 hour(s)).    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 EXAM: CT CHEST WITH CONTRAST TECHNIQUE: Multidetector CT imaging of the chest was performed during intravenous contrast administration. CONTRAST:  65mL ISOVUE-300 IOPAMIDOL (ISOVUE-300) INJECTION 61% COMPARISON:  MRI of the abdomen 05/25/2017 FINDINGS: Cardiovascular: No significant vascular findings. Normal heart size. No pericardial effusion. Mediastinum/Nodes: No enlarged mediastinal, hilar, or axillary lymph nodes. Thyroid gland, trachea, and esophagus demonstrate no significant findings. Lungs/Pleura: Lungs are clear. No pleural effusion or pneumothorax. 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 Portacaval lymphadenopathy. Heterogeneous appearance of the liver, likely due to areas of hypoperfusion. Suspected metastatic lesions seen on recent prior MRI and not well evaluated on the current study. Musculoskeletal: No chest wall abnormality. No acute or significant osseous findings. IMPRESSION: No evidence of metastatic disease to the thorax. Infiltrative pancreatic neck mass better visualized on MRI of the abdomen from the same date. Heterogeneous appearance of the liver, likely due to areas of hypoperfusion. Suspected hepatic metastatic lesions seen on recent MRI are not well visualized on the current study. Electronically  Signed   By: Fidela Salisbury M.D.   On: 05/25/2017 21:27   US Biopsy (liver)  Result Date: 05/26/2017 CLINICAL DATA:  Pancreatic mass with multiple liver lesions. The patient presents for liver lesion biopsy. EXAM: ULTRASOUND GUIDED CORE BIOPSY OF LIVER MEDICATIONS: 2.0 mg IV Versed; 100 mcg IV Fentanyl Total Moderate Sedation Time: 20 minutes. The patient's level of consciousness and physiologic status were continuously monitored during the procedure by Radiology nursing. PROCEDURE: The procedure, risks, benefits, and alternatives were explained to the patient. Questions regarding the procedure were encouraged and answered. The patient understands and consents to the procedure. A time out was performed prior to initiating the procedure. Ultrasound was performed of the liver to localize liver lesions. The abdominal wall was prepped with chlorhexidine in a sterile fashion, and a sterile drape was applied covering the operative field. A sterile gown and sterile gloves were used for the procedure. Local anesthesia was provided with 1% Lidocaine. Under ultrasound guidance, a 17 gauge needle was advanced into the liver. After confirming needle tip position, 2 separate 18 gauge core biopsy samples were obtained. Samples were submitted in formalin.  The outer needle was removed and additional ultrasound imaging performed. COMPLICATIONS: None. FINDINGS: By ultrasound, liver lesions are ill-defined and relatively hyperechoic compared to surrounding parenchyma. A lesion in the medial left lobe near the juncture with the right lobe was chosen for sampling and measures approximately 1.9 cm in greatest diameter. Solid tissue was obtained. IMPRESSION: Ultrasound-guided core biopsy performed of a lesion within the left lobe near the juncture of right and left lobes. Electronically Signed   By: Aletta Edouard M.D.   On: 05/26/2017 14:45   Mr Abdomen Mrcp W Wo Contast  Result Date: 05/25/2017 CLINICAL DATA:  Severe  epigastric abdominal pain for 2 weeks. Elevated liver function tests. EXAM: MRI ABDOMEN WITHOUT AND WITH CONTRAST (INCLUDING MRCP) TECHNIQUE: Multiplanar multisequence MR imaging of the abdomen was performed both before and after the administration of intravenous contrast. Heavily T2-weighted images of the biliary and pancreatic ducts were obtained, and three-dimensional MRCP images were rendered by post processing. CONTRAST:  62mL MULTIHANCE GADOBENATE DIMEGLUMINE 529 MG/ML IV SOLN COMPARISON:  02/20/2017 CT abdomen/pelvis. 05/13/2017 abdominal sonogram. FINDINGS: Lower chest: No acute abnormality at the lung bases. Hepatobiliary: Normal liver size and configuration. No hepatic steatosis. There are least 5 similar liver masses scattered throughout the liver, all new since 02/20/2017 CT study, each demonstrating T2 hyperintensity, T1 hypointensity and hypoenhancement, largest 1.8 x 1.2 cm in segment 4A of the left liver lobe (series 7/ image 10) and 1.3 x 1.1 cm in segment 6 of the right liver lobe (series 7/image 13). Distended gallbladder with no cholelithiasis, gallbladder wall thickening or pericholecystic fluid. Mild diffuse intrahepatic biliary ductal dilatation, new. Newly dilated common bile duct measuring 9 mm diameter approximately, with abrupt caliber transition of the common bile duct at the level of the pancreatic neck, with small caliber of the lower third of the common bile duct. No choledocholithiasis. Pancreas: There is a poorly marginated infiltrative-appearing hypoenhancing pancreatic neck mass measuring approximately 4.6 x 2.8 cm (series 1303/ image 64). This mass appears to encase the common hepatic artery and proximal splenic artery. This mass appears to encase and mildly narrow the proximal superior mesenteric artery (series 1304/ image 75). 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 There is mild irregular  dilatation of the main pancreatic duct in the pancreatic body and tail up to 4 mm diameter. There is parenchymal atrophy in the pancreatic body and tail. No pancreas divisum. Spleen: Top-normal size spleen (craniocaudal splenic length 12.8 cm. No splenic mass. Adrenals/Urinary Tract: No discrete adrenal nodules. No hydronephrosis. Normal kidneys with no renal mass. Stomach/Bowel: Grossly normal stomach. Visualized small and large bowel is normal caliber, with no bowel wall thickening. Vascular/Lymphatic: Normal caliber abdominal aorta. Patent hepatic and renal veins. Mildly enlarged 1.1 cm portacaval node (series 1303/image 63) . Other: No abdominal ascites or focal fluid collection. Musculoskeletal: No aggressive appearing focal osseous lesions. IMPRESSION: 1. Infiltrative 4.6 x 2.8 cm hypoenhancing pancreatic neck mass, most compatible with pancreatic adenocarcinoma. Endoscopic ultrasound with FNA recommended for further evaluation. 2. Double duct sign associated with the pancreatic neck mass, with mild intrahepatic biliary ductal dilatation and common bile duct diameter 9 mm. Mild pancreatic duct dilatation and pancreatic atrophy distal to the pancreatic neck mass. 3. Five scattered hypoenhancing liver masses, most compatible with liver metastases. 4. Mild portacaval adenopathy suspicious for nodal metastasis. 5. 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 Encasement of the common hepatic artery, proximal splenic artery and proximal SMA by the mass. These results were called by telephone at the time of interpretation on 05/25/2017 at 8:34 am to Dyanne Carrel, NP, who verbally acknowledged these results. Electronically Signed   By: Ilona Sorrel M.D.   On: 05/25/2017 08:52      Scheduled Meds: . [START ON 05/27/2017] feeding supplement (ENSURE ENLIVE)  237 mL Oral BID BM  . fentaNYL      . lidocaine (PF)      . midazolam      . potassium  chloride  40 mEq Oral Q4H   Continuous Infusions:   LOS: 1 day    Time spent: Total of 25 minutes spent with pt, greater than 50% of which was spent in discussion of  treatment, counseling and coordination of care    Chipper Oman, MD Pager: Text Page via [email or web address]   If 7PM-7AM, please contact night-coverage [email or web address] 05/26/2017, 4:37 PM

## 2017-05-26 NOTE — Sedation Documentation (Signed)
Patient is resting comfortably. Vitals stable. 

## 2017-05-26 NOTE — Sedation Documentation (Signed)
Patient is complains of pain

## 2017-05-26 NOTE — Sedation Documentation (Signed)
Pt is resting, no complaints at this time. Procedure started.

## 2017-05-27 ENCOUNTER — Telehealth: Payer: Self-pay | Admitting: Nurse Practitioner

## 2017-05-27 DIAGNOSIS — R109 Unspecified abdominal pain: Secondary | ICD-10-CM | POA: Diagnosis not present

## 2017-05-27 DIAGNOSIS — C787 Secondary malignant neoplasm of liver and intrahepatic bile duct: Secondary | ICD-10-CM | POA: Diagnosis not present

## 2017-05-27 DIAGNOSIS — G893 Neoplasm related pain (acute) (chronic): Secondary | ICD-10-CM | POA: Diagnosis not present

## 2017-05-27 DIAGNOSIS — C259 Malignant neoplasm of pancreas, unspecified: Secondary | ICD-10-CM | POA: Diagnosis not present

## 2017-05-27 LAB — CBC WITH DIFFERENTIAL/PLATELET
BASOS ABS: 0 10*3/uL (ref 0.0–0.1)
BASOS PCT: 0 %
EOS PCT: 3 %
Eosinophils Absolute: 0.3 10*3/uL (ref 0.0–0.7)
HCT: 31.8 % — ABNORMAL LOW (ref 36.0–46.0)
HEMOGLOBIN: 9.5 g/dL — AB (ref 12.0–15.0)
LYMPHS ABS: 1.2 10*3/uL (ref 0.7–4.0)
Lymphocytes Relative: 16 %
MCH: 24 pg — AB (ref 26.0–34.0)
MCHC: 29.9 g/dL — ABNORMAL LOW (ref 30.0–36.0)
MCV: 80.3 fL (ref 78.0–100.0)
Monocytes Absolute: 0.6 10*3/uL (ref 0.1–1.0)
Monocytes Relative: 9 %
NEUTROS PCT: 72 %
Neutro Abs: 5.3 10*3/uL (ref 1.7–7.7)
PLATELETS: 99 10*3/uL — AB (ref 150–400)
RBC: 3.96 MIL/uL (ref 3.87–5.11)
RDW: 15.6 % — ABNORMAL HIGH (ref 11.5–15.5)
WBC: 7.4 10*3/uL (ref 4.0–10.5)

## 2017-05-27 MED ORDER — MORPHINE SULFATE ER 15 MG PO TBCR: 15.0000 mg | EXTENDED_RELEASE_TABLET | Freq: Two times a day (BID) | ORAL | 0 refills | Status: DC

## 2017-05-27 MED ORDER — ENSURE ENLIVE PO LIQD: 237.0000 mL | Freq: Two times a day (BID) | ORAL | 12 refills | Status: DC

## 2017-05-27 MED ORDER — POLYETHYLENE GLYCOL 3350 17 G PO PACK: 17.0000 g | PACK | Freq: Every day | ORAL | 0 refills | Status: DC

## 2017-05-27 MED ORDER — POLYETHYLENE GLYCOL 3350 17 G PO PACK
17.0000 g | PACK | Freq: Every day | ORAL | Status: DC
  Administered 2017-05-27: 17 g via ORAL
  Filled 2017-05-27: qty 1

## 2017-05-27 MED ORDER — ZOLPIDEM TARTRATE 5 MG PO TABS: 5.0000 mg | ORAL_TABLET | Freq: Every evening | ORAL | Status: DC | PRN

## 2017-05-27 MED ORDER — MORPHINE SULFATE ER 15 MG PO TBCR
15.0000 mg | EXTENDED_RELEASE_TABLET | Freq: Two times a day (BID) | ORAL | Status: DC
  Administered 2017-05-27: 15 mg via ORAL
  Filled 2017-05-27: qty 1

## 2017-05-27 MED ORDER — OXYCODONE HCL 5 MG PO TABS: 5.0000 mg | ORAL_TABLET | Freq: Four times a day (QID) | ORAL | 0 refills | Status: DC | PRN

## 2017-05-27 MED ORDER — DOCUSATE SODIUM 100 MG PO CAPS: 100.0000 mg | ORAL_CAPSULE | Freq: Two times a day (BID) | ORAL | 0 refills | Status: DC

## 2017-05-27 MED ORDER — ZOLPIDEM TARTRATE 5 MG PO TABS: 5.0000 mg | ORAL_TABLET | Freq: Every evening | ORAL | 0 refills | Status: DC | PRN

## 2017-05-27 MED ORDER — DOCUSATE SODIUM 100 MG PO CAPS
100.0000 mg | ORAL_CAPSULE | Freq: Two times a day (BID) | ORAL | Status: DC
  Administered 2017-05-27: 100 mg via ORAL
  Filled 2017-05-27: qty 1

## 2017-05-27 NOTE — Discharge Summary (Signed)
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 Follow-up with PCP in 1-2 weeks. 2. Follow-up with Dr. Blair Promise in 1 week, probably on Friday. Office will call.   Follow-up Information    Mack Hook, MD. Schedule an appointment as soon as possible for a visit in 1 week(s).   Specialty:  Internal Medicine Contact information: [place] [address] [phone]        Ladell Pier, MD. Schedule an appointment as soon as possible for a visit in 1 week(s).   Specialty:  Oncology Contact information: [place] [address] [phone]          Diet recommendation: Regular diet  Activity: The patient is advised to gradually reintroduce usual activities.  Discharge Condition: good  Code Status: Full code  History of present illness: As per the H and P dictated on admission, "Crystal Carlson is a very pleasant 51 y.o. female with medical history significant for chronic pancreatitis, pre-diabetes as the emergency department chief complaint persistent/worsening abdominal pain. Initial evaluation concerning for pancreatitis versus cholecystitis vs pancreatic mass.  Information is obtained from the patient and her husband who is at the bedside as well as chart. Patient reports abdominal pain for 3 months. She states pain is sharp constant and radiates to her back. She's been taking pain medicine for 3 months during that time the  amount of relief induration of relief has diminished. Yesterday she reports "pain medicine did not help me at all. She reports the pain a 5 out of 10 at best 9 out of 10 at worst. She denies nausea and vomiting but does endorse some "bloating and constipation" is a 15 pound weight loss in the last 3 months. She states that eating makes it worse as well as lying on her back. She denies headache dizziness fever chills chest pain palpitation shortness of breath. She denies dysuria hematuria frequency or urgency.   Chart review indicates she underwent a CT of the abdomen 3 months ago which showed atrophic appearance of pancreatic body and tail and possible inflammation. She saw her primary care provider who opined possible chronic pancreatitis. She underwent an ultrasound that showed a normal gallbladder and CBD. She underwent MRCP results pending"  Hospital Course:  Patient presented with severe abdominal pain. CT abdomen on July 2018 was unremarkable. MRCP was performed due to complains about weight loss and worsening abdominal pain as an outpatient which was positive for pancreatic mass. GI was consulted as well as IR and patient underwent IR guided biopsy of the liver lesions. CA-19-9 was positive as well as biopsy was positive for adenocarcinoma.  Summary of her active problems in the hospital is as following. 1. Intractable abdominal pain. Pancreatic mass. Adenocarcinoma with liver metastasis. 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 IR was consulted, patient underwent ultrasound-guided liver biopsy. Per oncology preliminary report is positive for adenocarcinoma, with elevated CA-19-9 highly suspicious for pancreatic etiology. Per GI and oncology  not a surgical candidate. Further workup with inguinal histological stains is currently underway. Patient will follow up with oncology on Friday for further discussion and treatment options. For pain control patient was initially started on IV morphine and oxi IR. Based on her last 24 hours use patient will be discharged on oral MS Contin 15 mg twice a day scheduled along with OxyIR 10 mg every 6 hours when necessary. At the time of the discharge patient rated her pain as 0. At her baseline and her pain was 2-4. Patient will follow up with oncology and Friday for further medication. Bowel regimen with MiraLAX and Colace also provided.  2. Insomnia. We'll provide Ambien when necessary prescription.  3. Weight loss. Increased nutritional need. Recommend to continue supplements at home.  4. Microcytic anemia. Thrombocytopenia. Recommend to continue to monitor as an outpatient. No active bleeding here in the hospital.  5. Constipation. Continue stool softeners  All other chronic medical condition were stable during the hospitalization.  Patient was ambulatory without any assistance. On the day of the discharge the patient's vitals were stable, and no other acute medical condition were reported by patient. the patient was felt safe to be discharge at home with family.  Procedures and Results:  Liver biopsy   Consultations:  IR  Oncology   Gastroenterology    DISCHARGE MEDICATION: Current Discharge Medication List    START taking these medications   Details  docusate sodium (COLACE) 100 MG capsule Take 1 capsule (100 mg total) by mouth 2 (two) times daily. Qty: 10 capsule, Refills: 0    feeding supplement, ENSURE ENLIVE, (ENSURE ENLIVE) LIQD Take 237 mLs by mouth 2 (two) times daily between meals. Qty: 237 mL, Refills: 12    morphine (MS CONTIN) 15 MG 12 hr tablet Take 1 tablet (15 mg total) by mouth every 12 (twelve) hours. Qty: 10 tablet, Refills: 0   Associated  Diagnoses: Pancreatic mass    oxyCODONE (OXY IR/ROXICODONE) 5 MG immediate release tablet Take 1-2 tablets (5-10 mg total) by mouth every 6 (six) hours as needed for moderate pain. Qty: 40 tablet, Refills: 0   Associated Diagnoses: Pancreatic mass    polyethylene glycol (MIRALAX / GLYCOLAX) packet Take 17 g by mouth daily. Qty: 14 each, Refills: 0    zolpidem (AMBIEN) 5 MG tablet Take 1 tablet (5 mg total) by mouth at bedtime as needed for sleep. Qty: 10 tablet, Refills: 0      CONTINUE these medications which have NOT CHANGED   Details  lipase/protease/amylase (CREON) 36000 UNITS CPEP capsule Take 36,000 Units by mouth 3 (three) times daily with meals.      STOP taking these medications     HYDROcodone-acetaminophen (NORCO/VICODIN) 5-325 MG tablet        No Known Allergies Discharge Instructions    Diet general    Complete by:  As directed    Discharge instructions    Complete by:  As directed    It is important that you read following instructions as well as go over your medication list with RN to help you understand your care after this hospitalization.  Discharge Instructions: Please follow-up with PCP in one week  Please request your primary care physician  to go over all Hospital Tests and Procedure/Radiological results at the follow up,  Please get all Hospital records sent to your PCP by signing hospital release before you go home.   Do not drive, operating heavy machinery, perform activities at heights, swimming or participation in water activities or provide baby sitting services while you are on Pain, Sleep and Anxiety Medications; until you have been seen by Primary Care Physician or a Neurologist and advised to do so again. Do not take more than prescribed Pain, Sleep and Anxiety Medications. You were cared for by a hospitalist during your hospital stay. If you have any questions about your discharge medications or the care you received while you were in the  hospital after you are discharged, you can call the unit and ask to speak with the hospitalist on call if the hospitalist that took care of you is not available.  Once you are discharged, your primary care physician will handle any further medical issues. Please note that NO REFILLS for any discharge medications will be authorized once you are discharged, as it is imperative that you return to your primary care physician (or establish a relationship with a primary care physician if you do not have one) for your aftercare needs so that they can reassess your need for medications and monitor your lab values. You Must read complete instructions/literature along with all the possible adverse reactions/side effects for all the Medicines you take and that have been prescribed to you. Take any new Medicines after you have completely understood and accept all the possible adverse reactions/side effects. Wear Seat belts while driving. If you have smoked or chewed Tobacco in the last 2 yrs please stop smoking and/or stop any Recreational drug use.   Increase activity slowly    Complete by:  As directed      Discharge Exam: Filed Weights   05/25/17 0111  Weight: 61.7 kg (136 lb)   Vitals:   05/27/17 0510 05/27/17 1348  BP: 131/83 (!) 144/91  Pulse: 65 72  Resp: 16 20  Temp: 97.9 F (36.6 C) 99 F (37.2 C)  SpO2: 97% 100%   General: Appear in mild distress, no Rash; Oral Mucosa moist. Cardiovascular: S1 and S2 Present, no Murmur, no JVD Respiratory: Bilateral Air entry present and Clear to Auscultation, no Crackles, no wheezes Abdomen: Bowel Sound present, Soft and no tenderness Extremities: no Pedal edema, no calf tenderness Neurology: Grossly no focal neuro deficit.  The results of significant diagnostics from this hospitalization (including imaging, microbiology, ancillary and laboratory) are listed below for reference.    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 EXAM: CT CHEST WITH CONTRAST TECHNIQUE: Multidetector CT imaging of the chest was performed during intravenous contrast administration. CONTRAST:  2mL ISOVUE-300 IOPAMIDOL (ISOVUE-300) INJECTION 61% COMPARISON:  MRI of the abdomen 05/25/2017 FINDINGS: Cardiovascular: No significant vascular findings. Normal heart size. No pericardial effusion. Mediastinum/Nodes: No enlarged mediastinal, hilar, or axillary lymph nodes. Thyroid gland, trachea, and esophagus demonstrate no significant findings. Lungs/Pleura: Lungs are clear. No pleural effusion or pneumothorax. 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 Portacaval lymphadenopathy. Heterogeneous appearance of the liver, likely due to areas of hypoperfusion. Suspected metastatic lesions seen on recent prior MRI and not well evaluated on the current study. Musculoskeletal: No chest wall  abnormality. No acute or significant osseous findings. IMPRESSION: No evidence of metastatic disease to the thorax. Infiltrative pancreatic neck mass better visualized on MRI of the abdomen from the same date. Heterogeneous appearance of the liver, likely due to areas of hypoperfusion. Suspected hepatic metastatic lesions seen on recent MRI are not well visualized on the current study. Electronically Signed   By: Fidela Salisbury M.D.   On: 05/25/2017 21:27   US Abdomen Complete  Result Date: 05/13/2017 CLINICAL DATA:  Epigastric abdominal pain. EXAM: ABDOMEN ULTRASOUND COMPLETE COMPARISON:  CT scan of February 20, 2017. FINDINGS: Gallbladder: No gallstones or wall thickening visualized. No sonographic Murphy sign noted by sonographer. Common bile duct: Diameter: 4 mm which is within normal limits. Liver: No focal lesion identified. Within  normal limits in parenchymal echogenicity. Portal vein is patent on color Doppler imaging with normal direction of blood flow towards the liver. IVC: No abnormality visualized. Pancreas: Not visualized due to overlying bowel gas. Spleen: Size and appearance within normal limits. Right Kidney: Length: 10 cm. Echogenicity within normal limits. No mass or hydronephrosis visualized. Left Kidney: Length: 10.9 cm. Echogenicity within normal limits. No mass or hydronephrosis visualized. Abdominal aorta: No aneurysm visualized. Other findings: None. IMPRESSION: Pancreas not visualized due to overlying bowel gas. No definite abnormality seen in the abdomen. Electronically Signed   By: Marijo Conception, M.D.   On: 05/13/2017 09:14   US Biopsy (liver)  Result Date: 05/26/2017 CLINICAL DATA:  Pancreatic mass with multiple liver lesions. The patient presents for liver lesion biopsy. EXAM: ULTRASOUND GUIDED CORE BIOPSY OF LIVER MEDICATIONS: 2.0 mg IV Versed; 100 mcg IV Fentanyl Total Moderate Sedation Time: 20 minutes. The patient's level of consciousness and physiologic status were continuously monitored during the procedure by Radiology nursing. PROCEDURE: The procedure, risks, benefits, and alternatives were explained to the patient. Questions regarding the procedure were encouraged and answered. The patient understands and consents to the procedure. A time out was performed prior to initiating the procedure. Ultrasound was performed of the liver to localize liver lesions. The abdominal wall was prepped with chlorhexidine in a sterile fashion, and a sterile drape was applied covering the operative field. A sterile gown and sterile gloves were used for the procedure. Local anesthesia was provided with 1% Lidocaine. Under ultrasound guidance, a 17 gauge needle was advanced into the liver. After confirming needle tip position, 2 separate 18 gauge core biopsy samples were obtained. Samples were submitted in formalin. The outer  needle was removed and additional ultrasound imaging performed. COMPLICATIONS: None. FINDINGS: By ultrasound, liver lesions are ill-defined and relatively hyperechoic compared to surrounding parenchyma. A lesion in the medial left lobe near the juncture with the right lobe was chosen for sampling and measures approximately 1.9 cm in greatest diameter. Solid tissue was obtained. IMPRESSION: Ultrasound-guided core biopsy performed of a lesion within the left lobe near the juncture of right and left lobes. Electronically Signed   By: Aletta Edouard M.D.   On: 05/26/2017 14:45   Mr Abdomen Mrcp W Wo Contast  Result Date: 05/25/2017 CLINICAL DATA:  Severe epigastric abdominal pain for 2 weeks. Elevated liver function tests. EXAM: MRI ABDOMEN WITHOUT AND WITH CONTRAST (INCLUDING MRCP) TECHNIQUE: Multiplanar multisequence MR imaging of the abdomen was performed both before and after the administration of intravenous contrast. Heavily T2-weighted images of the biliary and pancreatic ducts were obtained, and three-dimensional MRCP images were rendered by post processing. CONTRAST:  80mL MULTIHANCE GADOBENATE DIMEGLUMINE 529 MG/ML IV SOLN COMPARISON:  02/20/2017  CT abdomen/pelvis. 05/13/2017 abdominal sonogram. FINDINGS: Lower chest: No acute abnormality at the lung bases. Hepatobiliary: Normal liver size and configuration. No hepatic steatosis. There are least 5 similar liver masses scattered throughout the liver, all new since 02/20/2017 CT study, each demonstrating T2 hyperintensity, T1 hypointensity and hypoenhancement, largest 1.8 x 1.2 cm in segment 4A of the left liver lobe (series 7/ image 10) and 1.3 x 1.1 cm in segment 6 of the right liver lobe (series 7/image 13). Distended gallbladder with no cholelithiasis, gallbladder wall thickening or pericholecystic fluid. Mild diffuse intrahepatic biliary ductal dilatation, new. Newly dilated common bile duct measuring 9 mm diameter approximately, with abrupt caliber  transition of the common bile duct at the level of the pancreatic neck, with small caliber of the lower third of the common bile duct. No choledocholithiasis. Pancreas: There is a poorly marginated infiltrative-appearing hypoenhancing pancreatic neck mass measuring approximately 4.6 x 2.8 cm (series 1303/ image 64). This mass appears to encase the common hepatic artery and proximal splenic artery. This mass appears to encase and mildly narrow the proximal superior mesenteric artery (series 1304/ image 75). 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 There is mild irregular dilatation of the main pancreatic duct in the pancreatic body and tail up to 4 mm diameter. There is parenchymal atrophy in the pancreatic body and tail. No pancreas divisum. Spleen: Top-normal size spleen (craniocaudal splenic length 12.8 cm. No splenic mass. Adrenals/Urinary Tract: No discrete adrenal nodules. No hydronephrosis. Normal kidneys with no renal mass. Stomach/Bowel: Grossly normal stomach. Visualized small and large bowel is normal caliber, with no bowel wall thickening. Vascular/Lymphatic: Normal caliber abdominal aorta. Patent hepatic and renal veins. Mildly enlarged 1.1 cm portacaval node (series 1303/image 63) . Other: No abdominal ascites or focal fluid collection. Musculoskeletal: No aggressive appearing focal osseous lesions. IMPRESSION: 1. Infiltrative 4.6 x 2.8 cm hypoenhancing pancreatic neck mass, most compatible with pancreatic adenocarcinoma. Endoscopic ultrasound with FNA recommended for further evaluation. 2. Double duct sign associated with the pancreatic neck mass, with mild intrahepatic biliary ductal dilatation and common bile duct diameter 9 mm. Mild pancreatic duct dilatation and pancreatic atrophy distal to the pancreatic neck mass. 3. Five scattered hypoenhancing liver masses, most compatible with liver metastases. 4. Mild portacaval  adenopathy suspicious for nodal metastasis. 5. 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 Encasement of the common hepatic artery, proximal splenic artery and proximal SMA by the mass. These results were called by telephone at the time of interpretation on 05/25/2017 at 8:34 am to Dyanne Carrel, NP, who verbally acknowledged these results. Electronically Signed   By: Ilona Sorrel M.D.   On: 05/25/2017 08:52    Microbiology: No results found for this or any previous visit (from the past 240 hour(s)).   Labs: CBC:  Recent Labs Lab 05/25/17 0113 05/26/17 0606 05/27/17 0437  WBC 7.0 7.2 7.4  NEUTROABS  --   --  5.3  HGB 10.5* 9.6* 9.5*  HCT 32.8* 31.4* 31.8*  MCV 78.7 80.3 80.3  PLT 115* 99* 99*   Basic Metabolic Panel:  Recent Labs Lab 05/25/17 0113 05/26/17 0606  NA 138 137  K 3.5 3.2*  CL 103 102  CO2 25 26  GLUCOSE 110* 92  BUN 5* <5*  CREATININE 0.73 0.75  CALCIUM 9.4 8.5*   Liver Function Tests:  Recent Labs Lab 05/25/17  0113 05/26/17 0606  AST 76* 42*  ALT 75* 55*  ALKPHOS 116 98  BILITOT 0.6 0.8  PROT 7.1 6.5  ALBUMIN 3.8 3.4*    Recent Labs Lab 05/25/17 0113  LIPASE 19   No results for input(s): AMMONIA in the last 168 hours. Cardiac Enzymes: No results for input(s): CKTOTAL, CKMB, CKMBINDEX, TROPONINI in the last 168 hours. BNP (last 3 results) No results for input(s): BNP in the last 8760 hours. CBG: No results for input(s): GLUCAP in the last 168 hours. Time spent: 40 minutes  Signed:  Berle Mull  [facility]  05/27/2017  , 4:00 PM

## 2017-05-27 NOTE — Progress Notes (Signed)
IP PROGRESS NOTE  Subjective:   She continues to have pain in the abdomen and back. She underwent biopsy of a liver lesion yesterday.  Oxycodone relieve the pain temporarily  Objective: Vital signs in last 24 hours: Blood pressure 131/83, pulse 65, temperature 97.9 F (36.6 C), temperature source Oral, resp. rate 16, height 5\' 4"  (1.626 m), weight 136 lb (61.7 kg), last menstrual period 05/11/2017, SpO2 97 %.  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 EXAM: CT CHEST WITH CONTRAST TECHNIQUE: Multidetector CT imaging of the chest was performed during intravenous contrast administration. CONTRAST:  58mL ISOVUE-300 IOPAMIDOL (ISOVUE-300) INJECTION 61% COMPARISON:  MRI of the abdomen 05/25/2017 FINDINGS: Cardiovascular: No significant vascular findings. Normal heart size. No pericardial effusion. Mediastinum/Nodes: No enlarged mediastinal, hilar, or axillary lymph nodes. Thyroid gland, trachea, and esophagus demonstrate no significant findings. Lungs/Pleura: Lungs are clear. No pleural effusion or pneumothorax. 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 Portacaval lymphadenopathy. Heterogeneous appearance of the liver, likely due to areas of hypoperfusion. Suspected  metastatic lesions seen on recent prior MRI and not well evaluated on the current study. Musculoskeletal: No chest wall abnormality. No acute or significant osseous findings. IMPRESSION: No evidence of metastatic disease to the thorax. Infiltrative pancreatic neck mass better visualized on MRI of the abdomen from the same date. Heterogeneous appearance of the liver, likely due to areas of hypoperfusion. Suspected hepatic metastatic lesions seen on recent MRI are not well visualized on the current study. Electronically Signed   By: Fidela Salisbury M.D.   On: 05/25/2017 21:27   US Biopsy (liver)  Result Date: 05/26/2017 CLINICAL DATA:  Pancreatic mass with multiple liver lesions. The patient presents for liver lesion biopsy. EXAM: ULTRASOUND GUIDED CORE BIOPSY OF LIVER MEDICATIONS: 2.0 mg IV Versed; 100 mcg IV Fentanyl Total Moderate Sedation Time: 20 minutes. The patient's level of consciousness and physiologic status were continuously monitored during the procedure by Radiology nursing. PROCEDURE: The procedure, risks, benefits, and alternatives were explained to the patient. Questions regarding the procedure were encouraged and answered. The patient understands and consents to the procedure. A time out was performed prior to initiating the procedure. Ultrasound was performed of the liver to localize liver lesions. The abdominal wall was prepped with chlorhexidine in a sterile fashion, and a sterile drape was applied covering the operative field. A sterile gown and sterile gloves were used for the procedure. Local anesthesia was provided with 1% Lidocaine. Under ultrasound guidance, a 17 gauge needle was advanced into the liver. After confirming needle tip position, 2 separate 18 gauge core biopsy samples were obtained. Samples were submitted in formalin. The outer needle was removed and additional ultrasound imaging performed. COMPLICATIONS: None. FINDINGS: By  ultrasound, liver lesions are ill-defined and  relatively hyperechoic compared to surrounding parenchyma. A lesion in the medial left lobe near the juncture with the right lobe was chosen for sampling and measures approximately 1.9 cm in greatest diameter. Solid tissue was obtained. IMPRESSION: Ultrasound-guided core biopsy performed of a lesion within the left lobe near the juncture of right and left lobes. Electronically Signed   By: Aletta Edouard M.D.   On: 05/26/2017 14:45    Medications: I have reviewed the patient's current medications.  Assessment/Plan:  1. 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 Pain secondary to #1  3. Weight loss  4.  Microcytic anemia  5.  Mild Thrombocytopenia   She appears stable. She continues to have pain related to the pancreas mass. I will add MS Contin.  Outpatient follow-up will be scheduled at the [facility] for within the next one week.  Addendum: I discussed the biopsy result with Dr. Orene Desanctis. She reports the biopsy is diagnostic of adenocarcinoma. She will perform immunohistochemical stains to  To confirm a diagnosis of pancreas adenocarcinoma.   LOS: 2 days   Donneta Romberg, MD   05/27/2017, 9:06 AM

## 2017-05-27 NOTE — Telephone Encounter (Signed)
Appt has been scheduled for the pt to see [name] on 10/12 at 145pm. Lft the pt a vm with appt information and to arrive 30 minutes early.

## 2017-05-27 NOTE — Progress Notes (Signed)
Crystal Carlson to be D/C'd Home per MD order.  Discussed with the patient and all questions fully answered.  VSS, Skin clean, dry and intact without evidence of skin break down, no evidence of skin tears noted. IV catheter discontinued intact. Site without signs and symptoms of complications. Dressing and pressure applied.  Per provider, disks and print out of imaging picked up from radiology and given to patient.   An After Visit Summary was printed and given to the patient. Patient received prescription.  D/c education completed with patient/family including follow up instructions, medication list, d/c activities limitations if indicated, with other d/c instructions as indicated by MD - patient able to verbalize understanding, all questions fully answered.   Patient instructed to return to ED, call 911, or call MD for any changes in condition.   Patient escorted via [name], and D/C home via private auto. Allergies as of 05/27/2017   No Known Allergies     Medication List    STOP taking these medications   HYDROcodone-acetaminophen 5-325 MG tablet Commonly known as:  NORCO/VICODIN     TAKE these medications   docusate sodium 100 MG capsule Commonly known as:  COLACE Take 1 capsule (100 mg total) by mouth 2 (two) times daily.   feeding supplement (ENSURE ENLIVE) Liqd Take 237 mLs by mouth 2 (two) times daily between meals.   lipase/protease/amylase 36000 UNITS Cpep capsule Commonly known as:  CREON Take 36,000 Units by mouth 3 (three) times daily with meals.   morphine 15 MG 12 hr tablet Commonly known as:  MS CONTIN Take 1 tablet (15 mg total) by mouth every 12 (twelve) hours.   oxyCODONE 5 MG immediate release tablet Commonly known as:  Oxy IR/ROXICODONE Take 1-2 tablets (5-10 mg total) by mouth every 6 (six) hours as needed for moderate pain.   polyethylene glycol packet Commonly known as:  MIRALAX / GLYCOLAX Take 17 g by mouth daily.   zolpidem 5 MG tablet Commonly known  as:  AMBIEN Take 1 tablet (5 mg total) by mouth at bedtime as needed for sleep.      Crystal Carlson 05/27/2017 5:24 PM

## 2017-05-28 NOTE — Progress Notes (Signed)
  Oncology Nurse Navigator Documentation  Navigator Location: [facility] (05/28/17 1054)   )Navigator Encounter Type: Telephone;Introductory phone call (05/28/17 1054) Telephone: L[facility] Crocker Call;Appt Confirmation/Clarification (left VM) (05/28/17 1054) Abnormal Finding Date: 05/13/17 (05/28/17 1054) Confirmed Diagnosis Date: 05/26/17 (05/28/17 1054)                 Treatment Phase: Pre-Tx/Tx Discussion (05/28/17 1054) Barriers/Navigation Needs: Education (05/28/17 1054)   Interventions: Psycho-social support (05/28/17 1054)  I called patient and left voice message introducing myself and confirming patient's appointment for 05/29/17. I left my contact information as well. I will meet patient at initial med/onc appointment with [name] NP on 05/29/17.          Acuity: Level 1 (05/28/17 1054)         Time Spent with Patient: 15 (05/28/17 1054)

## 2017-05-29 ENCOUNTER — Telehealth: Payer: Self-pay | Admitting: Oncology

## 2017-05-29 ENCOUNTER — Ambulatory Visit (HOSPITAL_BASED_OUTPATIENT_CLINIC_OR_DEPARTMENT_OTHER): Payer: Medicaid Other | Admitting: Nurse Practitioner

## 2017-05-29 ENCOUNTER — Ambulatory Visit: Payer: Self-pay | Admitting: Nurse Practitioner

## 2017-05-29 ENCOUNTER — Encounter: Payer: Self-pay | Admitting: Nurse Practitioner

## 2017-05-29 VITALS — BP 134/92 | HR 82 | Temp 98.8°F | Resp 18 | Ht 64.0 in | Wt 134.7 lb

## 2017-05-29 DIAGNOSIS — C787 Secondary malignant neoplasm of liver and intrahepatic bile duct: Secondary | ICD-10-CM

## 2017-05-29 DIAGNOSIS — C259 Malignant neoplasm of pancreas, unspecified: Secondary | ICD-10-CM

## 2017-05-29 DIAGNOSIS — D696 Thrombocytopenia, unspecified: Secondary | ICD-10-CM | POA: Diagnosis not present

## 2017-05-29 DIAGNOSIS — D490 Neoplasm of unspecified behavior of digestive system: Secondary | ICD-10-CM

## 2017-05-29 DIAGNOSIS — R634 Abnormal weight loss: Secondary | ICD-10-CM | POA: Diagnosis not present

## 2017-05-29 DIAGNOSIS — G893 Neoplasm related pain (acute) (chronic): Secondary | ICD-10-CM | POA: Diagnosis not present

## 2017-05-29 DIAGNOSIS — Z7189 Other specified counseling: Secondary | ICD-10-CM | POA: Insufficient documentation

## 2017-05-29 DIAGNOSIS — C257 Malignant neoplasm of other parts of pancreas: Secondary | ICD-10-CM

## 2017-05-29 DIAGNOSIS — K8689 Other specified diseases of pancreas: Secondary | ICD-10-CM

## 2017-05-29 MED ORDER — PROCHLORPERAZINE MALEATE 10 MG PO TABS: 10.0000 mg | ORAL_TABLET | Freq: Four times a day (QID) | ORAL | 2 refills | Status: DC | PRN

## 2017-05-29 MED ORDER — OXYCODONE HCL 5 MG PO TABS: 5.0000 mg | ORAL_TABLET | Freq: Four times a day (QID) | ORAL | 0 refills | Status: DC | PRN

## 2017-05-29 MED ORDER — MIRTAZAPINE 7.5 MG PO TABS: 7.5000 mg | ORAL_TABLET | Freq: Every day | ORAL | 0 refills | Status: DC

## 2017-05-29 MED ORDER — MORPHINE SULFATE ER 15 MG PO TBCR: 15.0000 mg | EXTENDED_RELEASE_TABLET | Freq: Two times a day (BID) | ORAL | 0 refills | Status: DC

## 2017-05-29 NOTE — Progress Notes (Signed)
START ON PATHWAY REGIMEN - Pancreatic     A cycle is every 14 days:     Oxaliplatin      Leucovorin      Irinotecan      5-Fluorouracil      5-Fluorouracil   **Always confirm dose/schedule in your pharmacy ordering system**    Patient Characteristics: Adenocarcinoma, Metastatic Disease, First Line, PS = 0, 1 Histology: Adenocarcinoma Current evidence of distant metastases<= Yes AJCC T Category: Staged < 8th Ed. AJCC N Category: Staged < 8th Ed. AJCC M Category: Staged < 8th Ed. AJCC 8 Stage Grouping: Staged < 8th Ed. Line of therapy: First Line Would you be surprised if this patient died  in the next year<= I would NOT be surprised if this patient died in the next year Intent of Therapy: Non-Curative / Palliative Intent, Discussed with Patient

## 2017-05-29 NOTE — Telephone Encounter (Signed)
Gave avs and calendar for October  °

## 2017-05-29 NOTE — Progress Notes (Addendum)
[place] OFFICE PROGRESS NOTE   Diagnosis:  Pancreas cancer  INTERVAL HISTORY:   Ms. Holecek returns for her first outpatient oncology follow-up visit since she was discharged from the hospital 05/27/2017. She was initially hospitalized 05/25/2017 with abdominal pain. MRI of the abdomen showed an infiltrative pancreatic neck mass and 5 scattered hypoenhancing liver masses compatible with liver metastases. Biopsy of a liver lesion on 05/26/2017 showed adenocarcinoma consistent with pancreatobiliary primary.   She reports abdominal pain is controlled with a combination of MS Contin 15 mg every 12 hours and 2 oxycodone tablets every 5-6 hours. She is constipated. She has taken a single dose of Miralax. She denies nausea. Appetite remains poor. She is losing weight. No fever. No shortness of breath or cough. No leg swelling or calf pain.  Objective:  Vital signs in last 24 hours:  Blood pressure (!) 134/92, pulse 82, temperature 98.8 F (37.1 C), temperature source Oral, resp. rate 18, height 5\' 4"  (1.626 m), weight 134 lb 11.2 oz (61.1 kg), last menstrual period 05/11/2017, SpO2 98 %.    HEENT: No thrush or ulcers. Resp: Lungs clear bilaterally. Cardio: Regular rate and rhythm. GI: Abdomen is soft. No hepatomegaly. No apparent ascites. Tender at the left upper abdomen. Vascular: No leg edema. Calves soft and nontender.   Lab Results:  Lab Results  Component Value Date   WBC 7.4 05/27/2017   HGB 9.5 (L) 05/27/2017   HCT 31.8 (L) 05/27/2017   MCV 80.3 05/27/2017   PLT 99 (L) 05/27/2017   NEUTROABS 5.3 05/27/2017    Imaging:  No results found.  Medications: I have reviewed the patient's current medications.  Assessment/Plan: 1. Pancreas mass/liver lesions  MRI abdomen 05/25/2017-pancreas neck mass with encasement of the hepatic/splenic/superior mesenteric arteries, liver lesions, portal/splenic vein thrombosis with cavernous transformation of the portal  vein  CT chest 05/25/2017-no evidence of metastatic disease to the chest, pancreas neck mass with vascular encasement and portacaval adenopathy, liver metastases not visualized  Elevated CA 19-9  Ultrasound-guided biopsy of a left liver lesion 05/26/2017. Adenocarcinoma consistent with pancreatobiliary primary.  2. Pain secondary to #1. Currently on MS Contin with oxycodone as needed for breakthrough pain.  3. Weight loss secondary to #1.  4. Microcytic anemia  5.  Mild thrombocytopenia  Disposition: Ms. Ringstad has been diagnosed with metastatic pancreas cancer involving the liver. Dr. Benay Spice reviewed the diagnosis, prognosis and treatment options with Ms. Postema and her family. She is interested in proceeding with chemotherapy. We discussed gemcitabine/Abraxane versus FOLFIRINOX versus a clinical trial. She would like to proceed with FOLFIRINOX.   We reviewed potential toxicities associated with FOLFIRINOX including bone marrow toxicity, nausea, mouth sores, hair loss. We discussed the diarrhea, possibly severe, associated with irinotecan. We discussed the different forms of neuropathy associated with oxaliplatin including cold sensitivity, peripheral neuropathy, acute laryngopharyngeal dysesthesia as well as more rare occurrences of diplopia, incontinence, ataxia. We reviewed potential toxicities associated with 5-fluorouracil including mouth sores, diarrhea, increased sensitivity to sun, rash, hand-foot syndrome. She agrees to proceed.   She will attend a chemotherapy education class. We will obtain baseline labs. We are referring her for a staging abdominal CT with pancreatic protocol. She understands a Port-A-Cath is required for this regimen. We made a referral to the Interventional Radiology Department at [facility].  We discussed a bowel regimen to help with the constipation. She will begin daily Miralax and Colace.  She will continue MS Contin 15 mg every 12 hours and oxycodone  as needed for breakthrough pain. She was provided with new prescriptions for both.  She will begin Remeron 7.5 mg daily at bedtime for appetite and depression.  She was provided with a prescription for Compazine 10 mg every 6 hours as needed for nausea.  We will see her in follow-up on 06/08/2017 prior to proceeding with cycle 1 FOLFIRINOX. She will contact the office in the interim with any problems.   Patient seen with Dr. Benay Spice. CT images were reviewed on the computer with Ms. Dancer and her family. 40 minutes were spent face-to-face at today's visit with the majority of that time involved in counseling/coordination of care.   [name] ANP/GNP-BC   05/29/2017  1:59 PM This was a shared visit with [name].Ms. Pangilinan has been diagnosed with metastatic pancreas cancer. We discussed the prognosis and treatment options with Ms. Rasnick and her family. We reviewed the MRI and CT images. I recommend systemic chemotherapy. We discussed gemcitabine/Abraxane and FOLFIRINOX we discussed the expected response rate associated with each of these regimens. She would like to proceed with FOLFIRINOX.  We reviewed the potential toxicities associated with the FOLFIRINOX regimen including the chance of allergic reaction, neuropathy, diarrhea,hematologic toxicity, infection, and bleeding. She agrees to proceed. Ms., will attend a chemotherapy teaching class.  She will be referred for placement of a Port-A-Cath prior to beginning a first cycle of FOLFIRINOX 06/08/2017. We will obtain a pancreas protocol abdomen CT as a baseline study as the liver metastases were not visualized on the chest CT.  She will be referred to the [place] social worker counselor and begin Remeron.  The etiology of the anemia/thrombocytopenia is unclear.We will repeat a CBC, ferritin level, andperipheral blood smear when she is here for a chemotherapy teaching class.  [name], M.D.

## 2017-06-02 ENCOUNTER — Other Ambulatory Visit (HOSPITAL_BASED_OUTPATIENT_CLINIC_OR_DEPARTMENT_OTHER): Payer: Medicaid Other

## 2017-06-02 ENCOUNTER — Encounter: Payer: Self-pay | Admitting: *Deleted

## 2017-06-02 ENCOUNTER — Other Ambulatory Visit: Payer: Self-pay | Admitting: Nurse Practitioner

## 2017-06-02 ENCOUNTER — Other Ambulatory Visit: Payer: Self-pay

## 2017-06-02 ENCOUNTER — Telehealth: Payer: Self-pay | Admitting: Oncology

## 2017-06-02 DIAGNOSIS — C787 Secondary malignant neoplasm of liver and intrahepatic bile duct: Principal | ICD-10-CM

## 2017-06-02 DIAGNOSIS — C259 Malignant neoplasm of pancreas, unspecified: Secondary | ICD-10-CM

## 2017-06-02 DIAGNOSIS — K8689 Other specified diseases of pancreas: Secondary | ICD-10-CM

## 2017-06-02 DIAGNOSIS — C257 Malignant neoplasm of other parts of pancreas: Secondary | ICD-10-CM

## 2017-06-02 LAB — COMPREHENSIVE METABOLIC PANEL
ALBUMIN: 3.6 g/dL (ref 3.5–5.0)
ALK PHOS: 295 U/L — AB (ref 40–150)
ALT: 209 U/L — AB (ref 0–55)
AST: 201 U/L (ref 5–34)
Anion Gap: 10 mEq/L (ref 3–11)
BILIRUBIN TOTAL: 0.85 mg/dL (ref 0.20–1.20)
BUN: 6.2 mg/dL — AB (ref 7.0–26.0)
CO2: 28 meq/L (ref 22–29)
CREATININE: 0.9 mg/dL (ref 0.6–1.1)
Calcium: 9.9 mg/dL (ref 8.4–10.4)
Chloride: 101 mEq/L (ref 98–109)
GLUCOSE: 147 mg/dL — AB (ref 70–140)
Potassium: 3.9 mEq/L (ref 3.5–5.1)
SODIUM: 138 meq/L (ref 136–145)
TOTAL PROTEIN: 7.4 g/dL (ref 6.4–8.3)

## 2017-06-02 LAB — CBC WITH DIFFERENTIAL/PLATELET
BASO%: 0.5 % (ref 0.0–2.0)
Basophils Absolute: 0 10*3/uL (ref 0.0–0.1)
EOS ABS: 0.2 10*3/uL (ref 0.0–0.5)
EOS%: 4.1 % (ref 0.0–7.0)
HCT: 33.3 % — ABNORMAL LOW (ref 34.8–46.6)
HEMOGLOBIN: 10.7 g/dL — AB (ref 11.6–15.9)
LYMPH%: 12.4 % — ABNORMAL LOW (ref 14.0–49.7)
MCH: 25.2 pg (ref 25.1–34.0)
MCHC: 32.1 g/dL (ref 31.5–36.0)
MCV: 78.4 fL — AB (ref 79.5–101.0)
MONO#: 0.4 10*3/uL (ref 0.1–0.9)
MONO%: 6.9 % (ref 0.0–14.0)
NEUT%: 76.1 % (ref 38.4–76.8)
NEUTROS ABS: 4.6 10*3/uL (ref 1.5–6.5)
Platelets: 138 10*3/uL — ABNORMAL LOW (ref 145–400)
RBC: 4.25 10*6/uL (ref 3.70–5.45)
RDW: 16.3 % — AB (ref 11.2–14.5)
WBC: 6.1 10*3/uL (ref 3.9–10.3)
lymph#: 0.7 10*3/uL — ABNORMAL LOW (ref 0.9–3.3)

## 2017-06-02 LAB — FERRITIN: Ferritin: 32 ng/ml (ref 9–269)

## 2017-06-02 LAB — CHCC SMEAR

## 2017-06-02 NOTE — Telephone Encounter (Signed)
Called patient and left voicemail regarding the addition of their appointments that were added per 10/16 sch msg.

## 2017-06-03 ENCOUNTER — Other Ambulatory Visit: Payer: Self-pay | Admitting: Radiology

## 2017-06-03 ENCOUNTER — Other Ambulatory Visit: Payer: Self-pay | Admitting: *Deleted

## 2017-06-03 ENCOUNTER — Other Ambulatory Visit: Payer: Self-pay | Admitting: Physician Assistant

## 2017-06-03 DIAGNOSIS — C787 Secondary malignant neoplasm of liver and intrahepatic bile duct: Secondary | ICD-10-CM

## 2017-06-03 MED ORDER — LIDOCAINE-PRILOCAINE 2.5-2.5 % EX CREA: TOPICAL_CREAM | CUTANEOUS | 1 refills | Status: DC

## 2017-06-04 ENCOUNTER — Ambulatory Visit (HOSPITAL_COMMUNITY)
Admission: RE | Admit: 2017-06-04 | Discharge: 2017-06-04 | Disposition: A | Discharge status: Home / Self Care | Payer: Medicaid Other | Source: Ambulatory Visit | Attending: Nurse Practitioner | Admitting: Nurse Practitioner

## 2017-06-04 ENCOUNTER — Other Ambulatory Visit: Payer: Self-pay | Admitting: Nurse Practitioner

## 2017-06-04 ENCOUNTER — Encounter (HOSPITAL_COMMUNITY): Payer: Self-pay | Admitting: Interventional Radiology

## 2017-06-04 DIAGNOSIS — C787 Secondary malignant neoplasm of liver and intrahepatic bile duct: Principal | ICD-10-CM

## 2017-06-04 DIAGNOSIS — C251 Malignant neoplasm of body of pancreas: Secondary | ICD-10-CM | POA: Diagnosis present

## 2017-06-04 DIAGNOSIS — K861 Other chronic pancreatitis: Secondary | ICD-10-CM | POA: Insufficient documentation

## 2017-06-04 DIAGNOSIS — R7303 Prediabetes: Secondary | ICD-10-CM | POA: Diagnosis not present

## 2017-06-04 DIAGNOSIS — C259 Malignant neoplasm of pancreas, unspecified: Secondary | ICD-10-CM

## 2017-06-04 HISTORY — PX: IR US GUIDE VASC ACCESS RIGHT: IMG2390

## 2017-06-04 HISTORY — PX: IR FLUORO GUIDE PORT INSERTION RIGHT: IMG5741

## 2017-06-04 LAB — CBC
HCT: 32.4 % — ABNORMAL LOW (ref 36.0–46.0)
HEMOGLOBIN: 9.8 g/dL — AB (ref 12.0–15.0)
MCH: 24.3 pg — AB (ref 26.0–34.0)
MCHC: 30.2 g/dL (ref 30.0–36.0)
MCV: 80.4 fL (ref 78.0–100.0)
PLATELETS: 127 10*3/uL — AB (ref 150–400)
RBC: 4.03 MIL/uL (ref 3.87–5.11)
RDW: 15.7 % — ABNORMAL HIGH (ref 11.5–15.5)
WBC: 6.4 10*3/uL (ref 4.0–10.5)

## 2017-06-04 LAB — APTT: APTT: 33 s (ref 24–36)

## 2017-06-04 LAB — PROTIME-INR
INR: 1.08
PROTHROMBIN TIME: 13.9 s (ref 11.4–15.2)

## 2017-06-04 MED ORDER — SODIUM CHLORIDE 0.9 % IV SOLN
INTRAVENOUS | Status: DC
  Administered 2017-06-04: 12:00:00 via INTRAVENOUS

## 2017-06-04 MED ORDER — HEPARIN SOD (PORK) LOCK FLUSH 100 UNIT/ML IV SOLN
INTRAVENOUS | Status: AC | PRN
  Administered 2017-06-04: 500 [IU]

## 2017-06-04 MED ORDER — FENTANYL CITRATE (PF) 100 MCG/2ML IJ SOLN
INTRAMUSCULAR | Status: AC | PRN
  Administered 2017-06-04: 50 ug via INTRAVENOUS
  Administered 2017-06-04 (×2): 25 ug via INTRAVENOUS

## 2017-06-04 MED ORDER — MIDAZOLAM HCL 2 MG/2ML IJ SOLN
INTRAMUSCULAR | Status: AC | PRN
  Administered 2017-06-04 (×2): 1 mg via INTRAVENOUS
  Administered 2017-06-04 (×2): 0.5 mg via INTRAVENOUS

## 2017-06-04 MED ORDER — LIDOCAINE HCL 1 % IJ SOLN
INTRAMUSCULAR | Status: AC
  Filled 2017-06-04: qty 20

## 2017-06-04 MED ORDER — HEPARIN SOD (PORK) LOCK FLUSH 100 UNIT/ML IV SOLN
INTRAVENOUS | Status: AC
  Filled 2017-06-04: qty 5

## 2017-06-04 MED ORDER — MIDAZOLAM HCL 2 MG/2ML IJ SOLN
INTRAMUSCULAR | Status: AC
  Filled 2017-06-04: qty 4

## 2017-06-04 MED ORDER — LIDOCAINE-EPINEPHRINE 2 %-1:100000 IJ SOLN
INTRAMUSCULAR | Status: AC | PRN
  Administered 2017-06-04: 20 mL

## 2017-06-04 MED ORDER — CEFAZOLIN SODIUM-DEXTROSE 2-4 GM/100ML-% IV SOLN
INTRAVENOUS | Status: AC
  Filled 2017-06-04: qty 100

## 2017-06-04 MED ORDER — CEFAZOLIN SODIUM-DEXTROSE 2-4 GM/100ML-% IV SOLN
2.0000 g | Freq: Once | INTRAVENOUS | Status: AC
  Administered 2017-06-04: 2 g via INTRAVENOUS

## 2017-06-04 MED ORDER — FENTANYL CITRATE (PF) 100 MCG/2ML IJ SOLN
INTRAMUSCULAR | Status: AC
  Filled 2017-06-04: qty 2

## 2017-06-04 MED ORDER — LIDOCAINE-EPINEPHRINE 2 %-1:100000 IJ SOLN
20.0000 mL | Freq: Once | INTRAMUSCULAR | Status: DC
  Filled 2017-06-04 (×2): qty 20

## 2017-06-04 NOTE — H&P (Signed)
Chief Complaint: Pancreatic Cancer   Referring Physician(s): [place]  Supervising Physician: Jacqulynn Cadet  Patient Status: [facility] - Out-pt  History of Present Illness: Crystal Carlson is a 51 y.o. female who is known to our service.  She underwent a liver mass biopsy on 05/26/2017 by Dr. Kathlene Cote.   Pathology revealed Adenocarcinoma c/w pancreatic primary.  She is here today for placement of a Port A Cath.  She is NPO. No blood thinners.  She continues to c/o severe abdominal pain.  She takes MS Contin.  She would be a good candidate for Celiac Plexus Block.  Past Medical History:  Diagnosis Date  . Cancer of pancreas ([place]) 05/26/2017  . Chronic pancreatitis ([place]) 05/19/2017  . Microcytic anemia 05/19/2017  . Prediabetes 2016   when living in [place]    No past surgical history on file.  Allergies: Patient has no known allergies.  Medications: Prior to Admission medications   Medication Sig Start Date End Date Taking? Authorizing Provider  docusate sodium (COLACE) 100 MG capsule Take 1 capsule (100 mg total) by mouth 2 (two) times daily. 05/27/17   Lavina Hamman, MD  feeding supplement, ENSURE ENLIVE, (ENSURE ENLIVE) LIQD Take 237 mLs by mouth 2 (two) times daily between meals. 05/27/17   Lavina Hamman, MD  lidocaine-prilocaine (EMLA) cream Apply to port site one hour prior to use. Do not rub in. Cover with plastic. 06/03/17   Ladell Pier, MD  lipase/protease/amylase (CREON) 36000 UNITS CPEP capsule Take 36,000 Units by mouth 3 (three) times daily with meals.    [provider]  mirtazapine (REMERON) 7.5 MG tablet Take 1 tablet (7.5 mg total) by mouth at bedtime. 05/29/17   Owens Shark, NP  morphine (MS CONTIN) 15 MG 12 hr tablet Take 1 tablet (15 mg total) by mouth every 12 (twelve) hours. 05/29/17   Owens Shark, NP  oxyCODONE (OXY IR/ROXICODONE) 5 MG immediate release tablet Take 1-2 tablets (5-10 mg total) by mouth every 6 (six)  hours as needed for moderate pain. 05/29/17   Owens Shark, NP  polyethylene glycol [facility] / GLYCOLAX) packet Take 17 g by mouth daily. 05/27/17   Lavina Hamman, MD  prochlorperazine (COMPAZINE) 10 MG tablet Take 1 tablet (10 mg total) by mouth every 6 (six) hours as needed for nausea or vomiting. 05/29/17   Owens Shark, NP  zolpidem (AMBIEN) 5 MG tablet Take 1 tablet (5 mg total) by mouth at bedtime as needed for sleep. 05/27/17   Lavina Hamman, MD     Family History  Problem Relation Age of Onset  . Hypertension Mother   . Arthritis Mother   . Diabetes Father   . Heart disease Father        ?valvular problems  . Hyperlipidemia Son   . Diabetes Brother     Social History   Social History  . Marital status: Married    Spouse name: N/A  . Number of children: 3  . Years of education: college grad--civics--states equivalent to High school here.   Occupational History  . Makes eyeglass lenses for an [employer] History Main Topics  . Smoking status: Never Smoker  . Smokeless tobacco: Never Used  . Alcohol use No  . Drug use: No  . Sexual activity: Not on file   Other Topics Concern  . Not on file   Social History Narrative   Originally from [place]   Moved to [place] In  43   Husband came in 2000   Lives at home with husband, 3 children and mother and father in law.    Review of Systems: A 12 point ROS discussed  Review of Systems  Constitutional: Positive for activity change, appetite change, fatigue and unexpected weight change.  HENT: Negative.   Respiratory: Negative.   Cardiovascular: Negative.   Gastrointestinal: Positive for abdominal pain.  Genitourinary: Negative.   Musculoskeletal: Negative.   Skin: Negative.   Neurological: Negative.   Hematological: Negative.   Psychiatric/Behavioral: Negative.     Vital Signs: BP (!) 147/97   Pulse 74   Temp 98.5 F (36.9 C)   Resp 20   Ht 5\' 4"  (1.626 m)   Wt 135 lb (61.2 kg)   LMP  05/11/2017 Comment: tubes tied  SpO2 100%   BMI 23.17 kg/m   Physical Exam  Constitutional: She is oriented to person, place, and time. She appears well-developed.  HENT:  Head: Normocephalic and atraumatic.  Eyes: EOM are normal.  Neck: Normal range of motion.  Cardiovascular: Normal rate, regular rhythm and normal heart sounds.   Pulmonary/Chest: Effort normal and breath sounds normal.  Abdominal: Soft. There is tenderness. There is guarding.  Musculoskeletal: Normal range of motion.  Neurological: She is alert and oriented to person, place, and time.  Skin: Skin is warm and dry.  Psychiatric: She has a normal mood and affect. Her behavior is normal. Judgment and thought content normal.  Vitals reviewed.   Imaging: Ct Chest W Contrast  Result Date: 05/25/2017 CLINICAL DATA:  Pancreatic adenocarcinoma with suspected hepatic metastatic disease. EXAM: CT CHEST WITH CONTRAST TECHNIQUE: Multidetector CT imaging of the chest was performed during intravenous contrast administration. CONTRAST:  29mL ISOVUE-300 IOPAMIDOL (ISOVUE-300) INJECTION 61% COMPARISON:  MRI of the abdomen 05/25/2017 FINDINGS: Cardiovascular: No significant vascular findings. Normal heart size. No pericardial effusion. Mediastinum/Nodes: No enlarged mediastinal, hilar, or axillary lymph nodes. Thyroid gland, trachea, and esophagus demonstrate no significant findings. Lungs/Pleura: Lungs are clear. No pleural effusion or pneumothorax. 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 Portacaval lymphadenopathy. Heterogeneous appearance of the liver, likely due to areas of hypoperfusion. Suspected metastatic lesions seen on recent prior MRI and not well evaluated on the current study. Musculoskeletal: No chest wall abnormality. No acute or significant osseous findings. IMPRESSION: No  evidence of metastatic disease to the thorax. Infiltrative pancreatic neck mass better visualized on MRI of the abdomen from the same date. Heterogeneous appearance of the liver, likely due to areas of hypoperfusion. Suspected hepatic metastatic lesions seen on recent MRI are not well visualized on the current study. Electronically Signed   By: Fidela Salisbury M.D.   On: 05/25/2017 21:27   US Abdomen Complete  Result Date: 05/13/2017 CLINICAL DATA:  Epigastric abdominal pain. EXAM: ABDOMEN ULTRASOUND COMPLETE COMPARISON:  CT scan of February 20, 2017. FINDINGS: Gallbladder: No gallstones or wall thickening visualized. No sonographic Murphy sign noted by sonographer. Common bile duct: Diameter: 4 mm which is within normal limits. Liver: No focal lesion identified. Within normal limits in parenchymal echogenicity. Portal vein is patent on color Doppler imaging with normal direction of blood flow towards the liver. IVC: No abnormality visualized. Pancreas: Not visualized due to overlying bowel gas. Spleen: Size and appearance within normal limits. Right Kidney: Length: 10 cm. Echogenicity within normal limits. No mass or hydronephrosis visualized. Left Kidney: Length: 10.9  cm. Echogenicity within normal limits. No mass or hydronephrosis visualized. Abdominal aorta: No aneurysm visualized. Other findings: None. IMPRESSION: Pancreas not visualized due to overlying bowel gas. No definite abnormality seen in the abdomen. Electronically Signed   By: Marijo Conception, M.D.   On: 05/13/2017 09:14   US Biopsy (liver)  Result Date: 05/26/2017 CLINICAL DATA:  Pancreatic mass with multiple liver lesions. The patient presents for liver lesion biopsy. EXAM: ULTRASOUND GUIDED CORE BIOPSY OF LIVER MEDICATIONS: 2.0 mg IV Versed; 100 mcg IV Fentanyl Total Moderate Sedation Time: 20 minutes. The patient's level of consciousness and physiologic status were continuously monitored during the procedure by Radiology nursing. PROCEDURE:  The procedure, risks, benefits, and alternatives were explained to the patient. Questions regarding the procedure were encouraged and answered. The patient understands and consents to the procedure. A time out was performed prior to initiating the procedure. Ultrasound was performed of the liver to localize liver lesions. The abdominal wall was prepped with chlorhexidine in a sterile fashion, and a sterile drape was applied covering the operative field. A sterile gown and sterile gloves were used for the procedure. Local anesthesia was provided with 1% Lidocaine. Under ultrasound guidance, a 17 gauge needle was advanced into the liver. After confirming needle tip position, 2 separate 18 gauge core biopsy samples were obtained. Samples were submitted in formalin. The outer needle was removed and additional ultrasound imaging performed. COMPLICATIONS: None. FINDINGS: By ultrasound, liver lesions are ill-defined and relatively hyperechoic compared to surrounding parenchyma. A lesion in the medial left lobe near the juncture with the right lobe was chosen for sampling and measures approximately 1.9 cm in greatest diameter. Solid tissue was obtained. IMPRESSION: Ultrasound-guided core biopsy performed of a lesion within the left lobe near the juncture of right and left lobes. Electronically Signed   By: Aletta Edouard M.D.   On: 05/26/2017 14:45   Mr Abdomen Mrcp W Wo Contast  Result Date: 05/25/2017 CLINICAL DATA:  Severe epigastric abdominal pain for 2 weeks. Elevated liver function tests. EXAM: MRI ABDOMEN WITHOUT AND WITH CONTRAST (INCLUDING MRCP) TECHNIQUE: Multiplanar multisequence MR imaging of the abdomen was performed both before and after the administration of intravenous contrast. Heavily T2-weighted images of the biliary and pancreatic ducts were obtained, and three-dimensional MRCP images were rendered by post processing. CONTRAST:  30mL MULTIHANCE GADOBENATE DIMEGLUMINE 529 MG/ML IV SOLN COMPARISON:   02/20/2017 CT abdomen/pelvis. 05/13/2017 abdominal sonogram. FINDINGS: Lower chest: No acute abnormality at the lung bases. Hepatobiliary: Normal liver size and configuration. No hepatic steatosis. There are least 5 similar liver masses scattered throughout the liver, all new since 02/20/2017 CT study, each demonstrating T2 hyperintensity, T1 hypointensity and hypoenhancement, largest 1.8 x 1.2 cm in segment 4A of the left liver lobe (series 7/ image 10) and 1.3 x 1.1 cm in segment 6 of the right liver lobe (series 7/image 13). Distended gallbladder with no cholelithiasis, gallbladder wall thickening or pericholecystic fluid. Mild diffuse intrahepatic biliary ductal dilatation, new. Newly dilated common bile duct measuring 9 mm diameter approximately, with abrupt caliber transition of the common bile duct at the level of the pancreatic neck, with small caliber of the lower third of the common bile duct. No choledocholithiasis. Pancreas: There is a poorly marginated infiltrative-appearing hypoenhancing pancreatic neck mass measuring approximately 4.6 x 2.8 cm (series 1303/ image 64). This mass appears to encase the common hepatic artery and proximal splenic artery. This mass appears to encase and mildly narrow the proximal superior mesenteric artery (series 1304/ image  75). 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 There is mild irregular dilatation of the main pancreatic duct in the pancreatic body and tail up to 4 mm diameter. There is parenchymal atrophy in the pancreatic body and tail. No pancreas divisum. Spleen: Top-normal size spleen (craniocaudal splenic length 12.8 cm. No splenic mass. Adrenals/Urinary Tract: No discrete adrenal nodules. No hydronephrosis. Normal kidneys with no renal mass. Stomach/Bowel: Grossly normal stomach. Visualized small and large bowel is normal caliber, with no bowel wall thickening. Vascular/Lymphatic:  Normal caliber abdominal aorta. Patent hepatic and renal veins. Mildly enlarged 1.1 cm portacaval node (series 1303/image 63) . Other: No abdominal ascites or focal fluid collection. Musculoskeletal: No aggressive appearing focal osseous lesions. IMPRESSION: 1. Infiltrative 4.6 x 2.8 cm hypoenhancing pancreatic neck mass, most compatible with pancreatic adenocarcinoma. Endoscopic ultrasound with FNA recommended for further evaluation. 2. Double duct sign associated with the pancreatic neck mass, with mild intrahepatic biliary ductal dilatation and common bile duct diameter 9 mm. Mild pancreatic duct dilatation and pancreatic atrophy distal to the pancreatic neck mass. 3. Five scattered hypoenhancing liver masses, most compatible with liver metastases. 4. Mild portacaval adenopathy suspicious for nodal metastasis. 5. 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 Encasement of the common hepatic artery, proximal splenic artery and proximal SMA by the mass. These results were called by telephone at the time of interpretation on 05/25/2017 at 8:34 am to Dyanne Carrel, NP, who verbally acknowledged these results. Electronically Signed   By: Ilona Sorrel M.D.   On: 05/25/2017 08:52    Labs:  CBC:  Recent Labs  05/25/17 0113 05/26/17 0606 05/27/17 0437 06/02/17 0956  WBC 7.0 7.2 7.4 6.1  HGB 10.5* 9.6* 9.5* 10.7*  HCT 32.8* 31.4* 31.8* 33.3*  PLT 115* 99* 99* 138 Oc large platelet*    COAGS:  Recent Labs  05/26/17 0606  INR 1.18  APTT 32    BMP:  Recent Labs  02/20/17 1142 05/08/17 1557 05/25/17 0113 05/26/17 0606 06/02/17 0956  NA 137 142 138 137 138  K 3.6 3.7 3.5 3.2* 3.9  CL 106 102 103 102  --   CO2 22 24 25 26 28   GLUCOSE 126* 99 110* 92 147*  BUN 10 15 5* <5* 6.2*  CALCIUM 9.2 9.8 9.4 8.5* 9.9  CREATININE 0.90 0.79 0.73 0.75 0.9  GFRNONAA >60 88 >60 >60  --   GFRAA >60 102 >60 >60  --     LIVER FUNCTION  TESTS:  Recent Labs  05/08/17 1557 05/25/17 0113 05/26/17 0606 06/02/17 0956  BILITOT 0.3 0.6 0.8 0.85  AST 23 76* 42* 201 Repeated and Verified*  ALT 19 75* 55* 209*  ALKPHOS 65 116 98 295*  PROT 7.0 7.1 6.5 7.4  ALBUMIN 4.3 3.8 3.4* 3.6    TUMOR MARKERS: No results for input(s): AFPTM, CEA, CA199, CHROMGRNA in the last 8760 hours.  Assessment and Plan:  Pancreatic Cancer with severe abdominal pain.  Will proceed with placement of a Port A Cath today.  Risks and benefits discussed with the patient including, but not limited to bleeding, infection, pneumothorax, or fibrin sheath development and need for additional procedures. All of the patient's questions were answered, patient is agreeable to proceed. Consent signed and in chart.  I also discussed the possibility of celiac plexus block with her and her husband to help  with her severe abdominal pain. They are interested in this. I explained to her to also discuss this with her oncologist and if in agreement,can plan at [facility] in the near future.  Thank you for this interesting consult.  I greatly enjoyed meeting Crystal Carlson and look forward to participating in their care.  A copy of this report was sent to the requesting provider on this date.  Electronically Signed: Murrell Redden, PA-C 06/04/2017, 9:55 AM   I spent a total of  25 Minutes in face to face in clinical consultation, greater than 50% of which was counseling/coordinating care for port a cath.

## 2017-06-04 NOTE — Sedation Documentation (Signed)
Patient is resting comfortably. 

## 2017-06-04 NOTE — Sedation Documentation (Signed)
Pt reports chronic pain and back pain. Vitals are stable. NAD at this time.

## 2017-06-04 NOTE — Procedures (Signed)
Interventional Radiology Procedure Note  Procedure: Placement of a right IJ approach single lumen PowerPort.  Tip is positioned at the superior cavoatrial junction and catheter is ready for immediate use.  Complications: No immediate Recommendations:  - Ok to shower tomorrow - Do not submerge for 7 days - Routine line care   Signed,  Lenore Moyano K. Aitanna Haubner, MD   

## 2017-06-04 NOTE — Discharge Instructions (Signed)
Implanted Port Insertion, Care After °This sheet gives you information about how to care for yourself after your procedure. Your health care provider may also give you more specific instructions. If you have problems or questions, contact your health care provider. °What can I expect after the procedure? °After your procedure, it is common to have: °· Discomfort at the port insertion site. °· Bruising on the skin over the port. This should improve over 3-4 days. ° °Follow these instructions at home: °Port care °· After your port is placed, you will get a manufacturer's information card. The card has information about your port. Keep this card with you at all times. °· Take care of the port as told by your health care provider. Ask your health care provider if you or a family member can get training for taking care of the port at home. A home health care nurse may also take care of the port. °· Make sure to remember what type of port you have. °Incision care °· Follow instructions from your health care provider about how to take care of your port insertion site. Make sure you: °? Wash your hands with soap and water before you change your bandage (dressing). If soap and water are not available, use hand sanitizer. °? Change your dressing as told by your health care provider. °? Leave stitches (sutures), skin glue, or adhesive strips in place. These skin closures may need to stay in place for 2 weeks or longer. If adhesive strip edges start to loosen and curl up, you may trim the loose edges. Do not remove adhesive strips completely unless your health care provider tells you to do that. °· Check your port insertion site every day for signs of infection. Check for: °? More redness, swelling, or pain. °? More fluid or blood. °? Warmth. °? Pus or a bad smell. °General instructions °· Do not take baths, swim, or use a hot tub until your health care provider approves. °· Do not lift anything that is heavier than 10 lb (4.5  kg) for a week, or as told by your health care provider. °· Ask your health care provider when it is okay to: °? Return to work or school. °? Resume usual physical activities or sports. °· Do not drive for 24 hours if you were given a medicine to help you relax (sedative). °· Take over-the-counter and prescription medicines only as told by your health care provider. °· Wear a medical alert bracelet in case of an emergency. This will tell any health care providers that you have a port. °· Keep all follow-up visits as told by your health care provider. This is important. °Contact a health care provider if: °· You cannot flush your port with saline as directed, or you cannot draw blood from the port. °· You have a fever or chills. °· You have more redness, swelling, or pain around your port insertion site. °· You have more fluid or blood coming from your port insertion site. °· Your port insertion site feels warm to the touch. °· You have pus or a bad smell coming from the port insertion site. °Get help right away if: °· You have chest pain or shortness of breath. °· You have bleeding from your port that you cannot control. °Summary °· Take care of the port as told by your health care provider. °· Change your dressing as told by your health care provider. °· Keep all follow-up visits as told by your health care provider. °  This information is not intended to replace advice given to you by your health care provider. Make sure you discuss any questions you have with your health care provider. °Document Released: 05/25/2013 Document Revised: 06/25/2016 Document Reviewed: 06/25/2016 °Elsevier Interactive Patient Education © 2017 [employer] ° °

## 2017-06-05 ENCOUNTER — Encounter: Payer: Self-pay | Admitting: Pharmacist

## 2017-06-05 ENCOUNTER — Ambulatory Visit: Payer: Self-pay | Admitting: Internal Medicine

## 2017-06-08 ENCOUNTER — Ambulatory Visit: Payer: Self-pay

## 2017-06-08 ENCOUNTER — Telehealth: Payer: Self-pay | Admitting: Oncology

## 2017-06-08 ENCOUNTER — Other Ambulatory Visit: Payer: Self-pay | Admitting: Gastroenterology

## 2017-06-08 ENCOUNTER — Ambulatory Visit (HOSPITAL_BASED_OUTPATIENT_CLINIC_OR_DEPARTMENT_OTHER): Payer: Medicaid Other | Admitting: Oncology

## 2017-06-08 ENCOUNTER — Encounter (HOSPITAL_COMMUNITY): Payer: Self-pay | Admitting: *Deleted

## 2017-06-08 ENCOUNTER — Encounter: Payer: Self-pay | Admitting: Oncology

## 2017-06-08 ENCOUNTER — Other Ambulatory Visit (HOSPITAL_BASED_OUTPATIENT_CLINIC_OR_DEPARTMENT_OTHER): Payer: Medicaid Other

## 2017-06-08 VITALS — BP 156/96 | HR 98 | Temp 98.3°F | Resp 20 | Ht 64.0 in | Wt 138.5 lb

## 2017-06-08 DIAGNOSIS — R634 Abnormal weight loss: Secondary | ICD-10-CM

## 2017-06-08 DIAGNOSIS — C257 Malignant neoplasm of other parts of pancreas: Secondary | ICD-10-CM | POA: Diagnosis not present

## 2017-06-08 DIAGNOSIS — C787 Secondary malignant neoplasm of liver and intrahepatic bile duct: Principal | ICD-10-CM

## 2017-06-08 DIAGNOSIS — C259 Malignant neoplasm of pancreas, unspecified: Secondary | ICD-10-CM

## 2017-06-08 DIAGNOSIS — G893 Neoplasm related pain (acute) (chronic): Secondary | ICD-10-CM

## 2017-06-08 DIAGNOSIS — K8689 Other specified diseases of pancreas: Secondary | ICD-10-CM

## 2017-06-08 LAB — COMPREHENSIVE METABOLIC PANEL
ALT: 284 U/L — AB (ref 0–55)
ANION GAP: 11 meq/L (ref 3–11)
AST: 327 U/L — AB (ref 5–34)
Albumin: 3.4 g/dL — ABNORMAL LOW (ref 3.5–5.0)
Alkaline Phosphatase: 472 U/L — ABNORMAL HIGH (ref 40–150)
BILIRUBIN TOTAL: 1.3 mg/dL — AB (ref 0.20–1.20)
BUN: 10 mg/dL (ref 7.0–26.0)
CALCIUM: 9.6 mg/dL (ref 8.4–10.4)
CO2: 27 meq/L (ref 22–29)
CREATININE: 1.1 mg/dL (ref 0.6–1.1)
Chloride: 99 mEq/L (ref 98–109)
EGFR: 60 mL/min/{1.73_m2} — ABNORMAL LOW (ref >60)
Glucose: 132 mg/dl (ref 70–140)
Potassium: 3.5 mEq/L (ref 3.5–5.1)
Sodium: 138 mEq/L (ref 136–145)
TOTAL PROTEIN: 7.3 g/dL (ref 6.4–8.3)

## 2017-06-08 MED ORDER — SORBITOL 70 % PO SOLN: 30.0000 mL | Freq: Two times a day (BID) | ORAL | 0 refills | Status: DC

## 2017-06-08 MED ORDER — MORPHINE SULFATE 15 MG PO TABS
ORAL_TABLET | ORAL | Status: AC
  Filled 2017-06-08: qty 1

## 2017-06-08 MED ORDER — LIDOCAINE-PRILOCAINE 2.5-2.5 % EX CREA
TOPICAL_CREAM | CUTANEOUS | Status: AC
  Filled 2017-06-08: qty 5

## 2017-06-08 MED ORDER — HYDROMORPHONE HCL 4 MG PO TABS: 4.0000 mg | ORAL_TABLET | ORAL | 0 refills | Status: DC | PRN

## 2017-06-08 MED ORDER — MORPHINE SULFATE ER 15 MG PO TBCR: 30.0000 mg | EXTENDED_RELEASE_TABLET | Freq: Two times a day (BID) | ORAL | 0 refills | Status: DC

## 2017-06-08 MED ORDER — MORPHINE SULFATE 15 MG PO TABS
15.0000 mg | ORAL_TABLET | Freq: Once | ORAL | Status: AC
  Administered 2017-06-08: 15 mg via ORAL

## 2017-06-08 MED FILL — SORBITOL 70% SOLUTION: 70 | 7 days supply | Qty: 474 | Fill #0

## 2017-06-08 MED FILL — HYDROmorphone HCL 4 MG TABS: 4 | 5 days supply | Qty: 60 | Fill #0

## 2017-06-08 NOTE — Progress Notes (Signed)
[place] OFFICE PROGRESS NOTE   Diagnosis: Pancreas cancer  INTERVAL HISTORY:   Ms. Habermehl returns as scheduled. She and went Port-A-Cath placement 06/04/2017. She continues to have severe abdomen/back pain. The pain is partially relieved with MS Contin and oxycodone. She is taking oxycodone 4 times per day. She complains of constipation. No bowel movement for the past week.  Objective:  Vital signs in last 24 hours:  Blood pressure (!) 156/96, pulse 98, temperature 98.3 F (36.8 C), temperature source Oral, resp. rate 20, height 5\' 4"  (1.626 m), weight 138 lb 8 oz (62.8 kg), last menstrual period 05/11/2017, SpO2 100 %.    HEENT: Sclera anicteric Resp: Lungs clear bilaterally Cardio: Regular rate and rhythm GI: No hepatosplenomegaly, no apparent ascites, tender in the midabdomen Vascular: No leg edema  Portacath/PICC-without erythema  Lab Results:  Lab Results  Component Value Date   WBC 6.4 06/04/2017   HGB 9.8 (L) 06/04/2017   HCT 32.4 (L) 06/04/2017   MCV 80.4 06/04/2017   PLT 127 (L) 06/04/2017   NEUTROABS 4.6 06/02/2017    CMP     Component Value Date/Time   NA 138 06/08/2017 1032   K 3.5 06/08/2017 1032   CL 102 05/26/2017 0606   CO2 27 06/08/2017 1032   GLUCOSE 132 06/08/2017 1032   BUN 10.0 06/08/2017 1032   CREATININE 1.1 06/08/2017 1032   CALCIUM 9.6 06/08/2017 1032   PROT 7.3 06/08/2017 1032   ALBUMIN 3.4 (L) 06/08/2017 1032   AST 327 (HH) 06/08/2017 1032   ALT 284 (HH) 06/08/2017 1032   ALKPHOS 472 (H) 06/08/2017 1032   BILITOT 1.30 (H) 06/08/2017 1032   GFRNONAA >60 05/26/2017 0606   GFRAA >60 05/26/2017 0606     Imaging:  Ir US Guide Vasc Access Right  Result Date: 06/04/2017 INDICATION: 51 year old female with pancreatic adenocarcinoma. She requires durable venous access for chemotherapy. EXAM: IMPLANTED PORT A CATH PLACEMENT WITH ULTRASOUND AND FLUOROSCOPIC GUIDANCE MEDICATIONS: 2 g Ancef; The antibiotic was  administered within an appropriate time interval prior to skin puncture. ANESTHESIA/SEDATION: Versed 3 mg IV; Fentanyl 100 mcg IV; Moderate Sedation Time:  25 minutes The patient was continuously monitored during the procedure by the interventional radiology nurse under my direct supervision. FLUOROSCOPY TIME:  0 minutes, 12 seconds (1 mGy) COMPLICATIONS: None immediate. PROCEDURE: The right neck and chest was prepped with chlorhexidine, and draped in the usual sterile fashion using maximum barrier technique (cap and mask, sterile gown, sterile gloves, large sterile sheet, hand hygiene and cutaneous antiseptic). Antibiotic prophylaxis was provided with 2g Ancef administered IV one hour prior to skin incision. Local anesthesia was attained by infiltration with 1% lidocaine with epinephrine. Ultrasound demonstrated patency of the right internal jugular vein, and this was documented with an image. Under real-time ultrasound guidance, this vein was accessed with a 21 gauge micropuncture needle and image documentation was performed. A small dermatotomy was made at the access site with an 11 scalpel. A 0.018" wire was advanced into the SVC and the access needle exchanged for a 34F micropuncture vascular sheath. The 0.018" wire was then removed and a 0.035" wire advanced into the IVC. An appropriate location for the subcutaneous reservoir was selected below the clavicle and an incision was made through the skin and underlying soft tissues. The subcutaneous tissues were then dissected using a combination of blunt and sharp surgical technique and a pocket was formed. A single lumen power injectable portacatheter was then tunneled through the subcutaneous tissues from  the pocket to the dermatotomy and the port reservoir placed within the subcutaneous pocket. The venous access site was then serially dilated and a peel away vascular sheath placed over the wire. The wire was removed and the port catheter advanced into position  under fluoroscopic guidance. The catheter tip is positioned in the superior cavoatrial junction. This was documented with a spot image. The portacatheter was then tested and found to flush and aspirate well. The port was flushed with saline followed by 100 units/mL heparinized saline. The pocket was then closed in two layers using first subdermal inverted interrupted absorbable sutures followed by a running subcuticular suture. The epidermis was then sealed with Dermabond. The dermatotomy at the venous access site was also closed with a single inverted subdermal suture and the epidermis sealed with Dermabond. IMPRESSION: Successful placement of a right IJ approach Power Port with ultrasound and fluoroscopic guidance. The catheter is ready for use. Electronically Signed   By: Jacqulynn Cadet M.D.   On: 06/04/2017 14:32   Ir Fluoro Guide Port Insertion Right  Result Date: 06/04/2017 INDICATION: 51 year old female with pancreatic adenocarcinoma. She requires durable venous access for chemotherapy. EXAM: IMPLANTED PORT A CATH PLACEMENT WITH ULTRASOUND AND FLUOROSCOPIC GUIDANCE MEDICATIONS: 2 g Ancef; The antibiotic was administered within an appropriate time interval prior to skin puncture. ANESTHESIA/SEDATION: Versed 3 mg IV; Fentanyl 100 mcg IV; Moderate Sedation Time:  25 minutes The patient was continuously monitored during the procedure by the interventional radiology nurse under my direct supervision. FLUOROSCOPY TIME:  0 minutes, 12 seconds (1 mGy) COMPLICATIONS: None immediate. PROCEDURE: The right neck and chest was prepped with chlorhexidine, and draped in the usual sterile fashion using maximum barrier technique (cap and mask, sterile gown, sterile gloves, large sterile sheet, hand hygiene and cutaneous antiseptic). Antibiotic prophylaxis was provided with 2g Ancef administered IV one hour prior to skin incision. Local anesthesia was attained by infiltration with 1% lidocaine with epinephrine.  Ultrasound demonstrated patency of the right internal jugular vein, and this was documented with an image. Under real-time ultrasound guidance, this vein was accessed with a 21 gauge micropuncture needle and image documentation was performed. A small dermatotomy was made at the access site with an 11 scalpel. A 0.018" wire was advanced into the SVC and the access needle exchanged for a 71F micropuncture vascular sheath. The 0.018" wire was then removed and a 0.035" wire advanced into the IVC. An appropriate location for the subcutaneous reservoir was selected below the clavicle and an incision was made through the skin and underlying soft tissues. The subcutaneous tissues were then dissected using a combination of blunt and sharp surgical technique and a pocket was formed. A single lumen power injectable portacatheter was then tunneled through the subcutaneous tissues from the pocket to the dermatotomy and the port reservoir placed within the subcutaneous pocket. The venous access site was then serially dilated and a peel away vascular sheath placed over the wire. The wire was removed and the port catheter advanced into position under fluoroscopic guidance. The catheter tip is positioned in the superior cavoatrial junction. This was documented with a spot image. The portacatheter was then tested and found to flush and aspirate well. The port was flushed with saline followed by 100 units/mL heparinized saline. The pocket was then closed in two layers using first subdermal inverted interrupted absorbable sutures followed by a running subcuticular suture. The epidermis was then sealed with Dermabond. The dermatotomy at the venous access site was also closed with a single  inverted subdermal suture and the epidermis sealed with Dermabond. IMPRESSION: Successful placement of a right IJ approach Power Port with ultrasound and fluoroscopic guidance. The catheter is ready for use. Electronically Signed   By: Jacqulynn Cadet  M.D.   On: 06/04/2017 14:32    Medications: I have reviewed the patient's current medications.  Assessment/Plan: 1. 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 Adenocarcinoma consistent with pancreatobiliary primary.  2. Pain secondary to #1.  3. Weight loss secondary to #1.  4. Microcytic anemia  5. Mild thrombocytopenia  6.  Port-A-Cath placement 06/04/2017  7.  Elevated liver enzymes/bili of-likely early biliary obstruction secondary to pancreas cancer    Disposition:  Ms. Pant continues to have severe abdominal/back pain secondary to the pancreas tumor. We increase the MS Contin dose and added Dilaudid today. She will begin sorbitol for constipation. She will contact us if her pain is not controlled with this regimen.  The liver enzymes and bilirubin are more elevated today. I suspect she is developing biliary obstruction secondary to the pancreas tumor. Bile duct and intrahepatic biliary dilatation were noted on the MRI 05/25/2017.  I contacted Dr.  Alessandra Bevels and he is scheduling her for an ERCP/stent placement tomorrow.  We decided to hold FOLFIRINOX chemotherapy until she returns for an office visit 06/18/2017.  30 minutes were spent with the patient today. The majority of the time was used for counseling and coordination of care.  Donneta Romberg, MD  06/08/2017  11:36 AM

## 2017-06-08 NOTE — Progress Notes (Signed)
  Oncology Nurse Navigator Documentation  Navigator Location: [facility] (06/08/17 1207)   )Navigator Encounter Type: Other (Referral to Social Work) (06/08/17 1207)    Per request of Drucilla Schmidt RN, referral made to Social Work for [employer] application.                   Treatment Phase: Pre-Tx/Tx Discussion (06/08/17 1207)                  Acuity: Level 2 (06/08/17 1207)   Acuity Level 2: Referrals such as genetics, survivorship (06/08/17 1207)     Time Spent with Patient: 15 (06/08/17 1207)

## 2017-06-08 NOTE — Telephone Encounter (Signed)
Scheduled appt per 10/22 los - Gave patient AVS and calender per los.  

## 2017-06-08 NOTE — Progress Notes (Signed)
Met with patient and spouse to go over one-time $400 [employer].  Advised patient approval was based on income provided to hospital counselor during admission.  Explained the grant details and expenses covered. Gave patient a copy of award letter as well as expense sheet along with our outpatient pharmacy information. Patient received a gas card today from grant. She has my contact name and number for any additional financial questions or concerns.

## 2017-06-08 NOTE — Progress Notes (Signed)
Pt denies SOB, chest pain, and being under the care of a cardiologist. Pt made aware to stop taking Aspirin, vitamins, fish oil and herbal medications. Do not take any NSAIDs ie: Ibuprofen, Advil, Naproxen(Aleve), Motrin, BC and Goody Powder or any medication containing Aspirin. Pt verbalized understanding of all pre-op instructions.

## 2017-06-09 ENCOUNTER — Encounter (HOSPITAL_COMMUNITY)
Admission: RE | Disposition: A | Discharge status: Home / Self Care | Payer: Self-pay | Source: Ambulatory Visit | Attending: Gastroenterology

## 2017-06-09 ENCOUNTER — Ambulatory Visit (HOSPITAL_COMMUNITY): Payer: Medicaid Other | Admitting: Certified Registered Nurse Anesthetist

## 2017-06-09 ENCOUNTER — Encounter (HOSPITAL_COMMUNITY): Payer: Self-pay | Admitting: *Deleted

## 2017-06-09 ENCOUNTER — Ambulatory Visit (HOSPITAL_COMMUNITY)
Admission: RE | Admit: 2017-06-09 | Discharge: 2017-06-09 | Disposition: A | Discharge status: Home / Self Care | Payer: Medicaid Other | Source: Ambulatory Visit | Attending: Gastroenterology | Admitting: Gastroenterology

## 2017-06-09 ENCOUNTER — Ambulatory Visit (HOSPITAL_COMMUNITY): Payer: Medicaid Other

## 2017-06-09 DIAGNOSIS — D696 Thrombocytopenia, unspecified: Secondary | ICD-10-CM | POA: Insufficient documentation

## 2017-06-09 DIAGNOSIS — K831 Obstruction of bile duct: Secondary | ICD-10-CM | POA: Diagnosis not present

## 2017-06-09 DIAGNOSIS — Z79891 Long term (current) use of opiate analgesic: Secondary | ICD-10-CM | POA: Insufficient documentation

## 2017-06-09 DIAGNOSIS — C250 Malignant neoplasm of head of pancreas: Secondary | ICD-10-CM | POA: Diagnosis not present

## 2017-06-09 DIAGNOSIS — D509 Iron deficiency anemia, unspecified: Secondary | ICD-10-CM | POA: Insufficient documentation

## 2017-06-09 DIAGNOSIS — G893 Neoplasm related pain (acute) (chronic): Secondary | ICD-10-CM | POA: Diagnosis not present

## 2017-06-09 DIAGNOSIS — C259 Malignant neoplasm of pancreas, unspecified: Secondary | ICD-10-CM

## 2017-06-09 DIAGNOSIS — K8689 Other specified diseases of pancreas: Secondary | ICD-10-CM

## 2017-06-09 HISTORY — PX: ERCP: SHX5425

## 2017-06-09 SURGERY — ERCP, WITH INTERVENTION IF INDICATED
Anesthesia: General

## 2017-06-09 MED ORDER — INDOMETHACIN 50 MG RE SUPP
RECTAL | Status: DC | PRN
  Administered 2017-06-09 (×2): 50 mg via RECTAL

## 2017-06-09 MED ORDER — LACTATED RINGERS IV SOLN
INTRAVENOUS | Status: DC
  Administered 2017-06-09 (×2): via INTRAVENOUS

## 2017-06-09 MED ORDER — KETOROLAC TROMETHAMINE 30 MG/ML IJ SOLN: 30.0000 mg | Freq: Once | INTRAMUSCULAR | Status: DC | PRN

## 2017-06-09 MED ORDER — SODIUM CHLORIDE 0.9 % IV SOLN: INTRAVENOUS | Status: DC

## 2017-06-09 MED ORDER — SODIUM CHLORIDE 0.9 % IV SOLN
INTRAVENOUS | Status: DC | PRN
  Administered 2017-06-09: 20 mL

## 2017-06-09 MED ORDER — GLUCAGON HCL RDNA (DIAGNOSTIC) 1 MG IJ SOLR
INTRAMUSCULAR | Status: AC
  Filled 2017-06-09: qty 1

## 2017-06-09 MED ORDER — LIDOCAINE HCL (CARDIAC) 20 MG/ML IV SOLN
INTRAVENOUS | Status: DC | PRN
  Administered 2017-06-09: 60 mg via INTRAVENOUS

## 2017-06-09 MED ORDER — SUGAMMADEX SODIUM 200 MG/2ML IV SOLN
INTRAVENOUS | Status: DC | PRN
  Administered 2017-06-09: 250 mg via INTRAVENOUS

## 2017-06-09 MED ORDER — FENTANYL CITRATE (PF) 100 MCG/2ML IJ SOLN: 25.0000 ug | INTRAMUSCULAR | Status: DC | PRN

## 2017-06-09 MED ORDER — ONDANSETRON HCL 4 MG/2ML IJ SOLN: 4.0000 mg | Freq: Once | INTRAMUSCULAR | Status: DC | PRN

## 2017-06-09 MED ORDER — ACETAMINOPHEN 325 MG PO TABS: 325.0000 mg | ORAL_TABLET | ORAL | Status: DC | PRN

## 2017-06-09 MED ORDER — CIPROFLOXACIN IN D5W 400 MG/200ML IV SOLN
INTRAVENOUS | Status: AC
  Filled 2017-06-09: qty 200

## 2017-06-09 MED ORDER — OXYCODONE HCL 5 MG PO TABS: 5.0000 mg | ORAL_TABLET | Freq: Once | ORAL | Status: DC | PRN

## 2017-06-09 MED ORDER — MEPERIDINE HCL 100 MG/ML IJ SOLN: 6.2500 mg | INTRAMUSCULAR | Status: DC | PRN

## 2017-06-09 MED ORDER — CIPROFLOXACIN IN D5W 400 MG/200ML IV SOLN
INTRAVENOUS | Status: DC | PRN
  Administered 2017-06-09: 400 mg via INTRAVENOUS

## 2017-06-09 MED ORDER — DEXAMETHASONE SODIUM PHOSPHATE 10 MG/ML IJ SOLN
INTRAMUSCULAR | Status: DC | PRN
  Administered 2017-06-09: 5 mg via INTRAVENOUS

## 2017-06-09 MED ORDER — MIDAZOLAM HCL 5 MG/5ML IJ SOLN
INTRAMUSCULAR | Status: DC | PRN
  Administered 2017-06-09: 1 mg via INTRAVENOUS

## 2017-06-09 MED ORDER — ACETAMINOPHEN 160 MG/5ML PO SOLN: 325.0000 mg | ORAL | Status: DC | PRN

## 2017-06-09 MED ORDER — IOPAMIDOL (ISOVUE-300) INJECTION 61%
INTRAVENOUS | Status: AC
  Filled 2017-06-09: qty 50

## 2017-06-09 MED ORDER — OXYCODONE HCL 5 MG/5ML PO SOLN: 5.0000 mg | Freq: Once | ORAL | Status: DC | PRN

## 2017-06-09 MED ORDER — FENTANYL CITRATE (PF) 100 MCG/2ML IJ SOLN
INTRAMUSCULAR | Status: DC | PRN
  Administered 2017-06-09: 25 ug via INTRAVENOUS
  Administered 2017-06-09: 50 ug via INTRAVENOUS

## 2017-06-09 MED ORDER — ROCURONIUM BROMIDE 100 MG/10ML IV SOLN
INTRAVENOUS | Status: DC | PRN
Start: 2017-06-09 — End: 2017-06-09
  Administered 2017-06-09: 50 mg via INTRAVENOUS

## 2017-06-09 MED ORDER — PROPOFOL 10 MG/ML IV BOLUS
INTRAVENOUS | Status: DC | PRN
  Administered 2017-06-09: 100 mg via INTRAVENOUS

## 2017-06-09 MED ORDER — INDOMETHACIN 50 MG RE SUPP
RECTAL | Status: AC
  Filled 2017-06-09: qty 2

## 2017-06-09 MED ORDER — ONDANSETRON HCL 4 MG/2ML IJ SOLN
INTRAMUSCULAR | Status: DC | PRN
  Administered 2017-06-09: 4 mg via INTRAVENOUS

## 2017-06-09 NOTE — Anesthesia Procedure Notes (Signed)
Procedure Name: Intubation Date/Time: 06/09/2017 11:06 AM Performed by: Imagene Riches Pre-anesthesia Checklist: Patient identified, Suction available, Emergency Drugs available and Patient being monitored Patient Re-evaluated:Patient Re-evaluated prior to induction Oxygen Delivery Method: Circle system utilized Preoxygenation: Pre-oxygenation with 100% oxygen Induction Type: IV induction Laryngoscope Size: Miller and 2 Grade View: Grade I Tube type: Oral Tube size: 7.0 mm Number of attempts: 1 Airway Equipment and Method: Stylet Placement Confirmation: ETT inserted through vocal cords under direct vision,  positive ETCO2 and breath sounds checked- equal and bilateral Secured at: 24 cm Tube secured with: Tape Dental Injury: Teeth and Oropharynx as per pre-operative assessment

## 2017-06-09 NOTE — Anesthesia Preprocedure Evaluation (Signed)
Anesthesia Evaluation  Patient identified by MRN, date of birth, ID band Patient awake    Reviewed: Allergy & Precautions, NPO status , Patient's Chart, lab work & pertinent test results  Airway Mallampati: I       Dental no notable dental hx. (+) Teeth Intact   Pulmonary neg pulmonary ROS,    Pulmonary exam normal breath sounds clear to auscultation       Cardiovascular Normal cardiovascular exam Rhythm:Regular Rate:Normal     Neuro/Psych negative neurological ROS  negative psych ROS   GI/Hepatic Neg liver ROS,   Endo/Other  negative endocrine ROS  Renal/GU negative Renal ROS  negative genitourinary   Musculoskeletal negative musculoskeletal ROS (+)   Abdominal Normal abdominal exam  (+)   Peds  Hematology  (+) anemia ,   Anesthesia Other Findings   Reproductive/Obstetrics                             Anesthesia Physical Anesthesia Plan  ASA: II  Anesthesia Plan: General   Post-op Pain Management:    Induction: Intravenous  PONV Risk Score and Plan: 3 and Ondansetron, Dexamethasone and Midazolam  Airway Management Planned: Oral ETT  Additional Equipment:   Intra-op Plan:   Post-operative Plan: Extubation in OR  Informed Consent: I have reviewed the patients History and Physical, chart, labs and discussed the procedure including the risks, benefits and alternatives for the proposed anesthesia with the patient or authorized representative who has indicated his/her understanding and acceptance.   Dental advisory given  Plan Discussed with: CRNA and Surgeon  Anesthesia Plan Comments:         Anesthesia Quick Evaluation

## 2017-06-09 NOTE — Transfer of Care (Signed)
Immediate Anesthesia Transfer of Care Note  Patient: Crystal Carlson  Procedure(s) Performed: ENDOSCOPIC RETROGRADE CHOLANGIOPANCREATOGRAPHY (ERCP) (N/A )  Patient Location: Endoscopy Unit  Anesthesia Type:General  Level of Consciousness: awake, alert  and oriented  Airway & Oxygen Therapy: Patient Spontanous Breathing  Post-op Assessment: Report given to RN and Post -op Vital signs reviewed and stable  Post vital signs: Reviewed and stable  Last Vitals:  Vitals:   06/09/17 1000  BP: (!) 144/96  Resp: 17  Temp: 36.9 C  SpO2: 100%    Last Pain:  Vitals:   06/09/17 1005  TempSrc:   PainSc: 5       Patients Stated Pain Goal: 2 (49/75/30 0511)  Complications: No apparent anesthesia complications

## 2017-06-09 NOTE — Discharge Instructions (Signed)
° ° °  Endoscopic Retrograde Cholangiopancreatogram, Care After This sheet gives you information about how to care for yourself after your procedure. Your health care provider may also give you more specific instructions. If you have problems or questions, contact your health care provider. What can I expect after the procedure? After the procedure, it is common to have:  Soreness in your throat.  Nausea.  Bloating.  Dizziness.  Tiredness (fatigue).  Follow these instructions at home:  Take over-the-counter and prescription medicines only as told by your health care provider.  Do not drive for 24 hours if you were given a medicine to help you relax (sedative) during your procedure. Have someone stay with you for 24 hours after the procedure.  Return to your normal activities as told by your health care provider. Ask your health care provider what activities are safe for you.  Return to eating what you normally do as soon as you feel well enough or as told by your health care provider.  Keep all follow-up visits as told by your health care provider. This is important. Contact a health care provider if:  You have pain in your abdomen that does not get better with medicine.  You develop signs of infection, such as: ? Chills. ? Feeling unwell. Get help right away if:  You have difficulty swallowing.  You have worsening pain in your throat, chest, or abdomen.  You vomit bright red blood or a substance that looks like coffee grounds.  You have bloody or very black stools.  You have a fever.  You have a sudden increase in swelling (bloating) in your abdomen. Summary  After the procedure, it is common to feel tired and to have some discomfort in your throat.  Contact your health care provider if you have signs of infection--such as chills or feeling unwell--or if you have pain that does not improve with medicine.  Get help right away if you have trouble swallowing,  worsening pain, bloody or black vomit, bloody or black stools, a fever, or increased swelling in your abdomen.  Keep all follow-up visits as told by your health care provider. This is important. This information is not intended to replace advice given to you by your health care provider. Make sure you discuss any questions you have with your health care provider. Document Released: 05/25/2013 Document Revised: 06/23/2016 Document Reviewed: 06/23/2016 Elsevier Interactive Patient Education  2017 [place] liquids only until 5 PM and if doing well may have soft solids and if doing well may advance diet tomorrow and call us if we can be of any further assistance or if Dr. Benay Spice would like our input otherwise call him tomorrow to let him know stent was successfully placed today and call us sooner specifically if any significant pain nausea vomiting temperature greater than 100 or signs of GI bleeding or black diarrhea

## 2017-06-09 NOTE — Progress Notes (Signed)
Crystal Carlson 10:41 AM  Subjective: Patient seen and examined and case discussed with her husband and case discussed with my partner Dr. Jacinto Reap and her hospital computer chart reviewed and she has no new complaints  Objective: Vital signs stable afebrile no acute distress exam please see preassessment evaluation yesterday's labs reviewed CT and MRI reviewed  Assessment: Metastatic pancreatic cancer  Plan: Okay to proceed with ERCP with anesthesia assistance and hopefully stent placement and that procedure including the risks and success rate was discussed with the patient and her husband today by me and previously by my partner  [facility]  Pager [phone] After 5PM or if no answer call [phone]

## 2017-06-09 NOTE — Op Note (Signed)
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 Estimated Blood Loss:     Estimated blood loss: none. Procedure:                Pre-Anesthesia Assessment:                           - Prior to the procedure, a History and Physical                            was performed, and patient medications and                            allergies were reviewed. The patient's tolerance of                            previous anesthesia was also reviewed. The risks                            and benefits of the procedure and the sedation                            options and risks were discussed with the patient.                            All questions were answered, and informed consent                            was obtained. Prior Anticoagulants: The patient has                            taken no previous anticoagulant or antiplatelet                            agents. ASA Grade Assessment: II - A patient with                            mild systemic  disease. After reviewing the risks                            and benefits, the patient was deemed in                            satisfactory condition to undergo the procedure.                           After obtaining informed consent, the scope was                            passed under direct vision. Throughout the                            procedure, the patient's blood pressure, pulse, and     oxygen saturations were monitored continuously. The                            [id number]) scope was introduced through                            the mouth, and used to inject contrast into and                            used to locate the major papilla. The ERCP was                            accomplished without difficulty. The patient                            tolerated the procedure well. Scope In: Scope Out: Findings:      deep selective cannulation was fairly readily obtained and there was no       pancreatic duct wire advancement or dye injection and she did have a       distal CBD stricture which was measured with the sphincterotome to be       about 5 cm long and we proceeded with theBiliary sphincterotomy was made       with a Hydratome sphincterotome using ERBE electrocautery in the       customary fashion until he had adequate biliary drainage. There was no       post-sphincterotomy bleeding. One 10 Fr by 6 cm covered metal stent was       placed 5.5 cm into the common bile duct. dirty Bile flowed through the       stent. The stent was in good position. the scope introducer and wire       were removed and the patient tolerated the procedure well Impression:               - A biliary sphincterotomy was performed.                           - One covered metal stent was placed into the  common bile duct. Moderate Sedation:      moderate sedation-none Recommendation:           - Clear liquid diet today. slowly advance this                            evening if doing well                           - Continue present medications.                           - Return to GI clinic PRN.                           - Telephone GI clinic if symptomatic PRN. and let                            oncology know stent was placed Procedure Code(s):        --- Professional ---                           [phone], Esophagogastroduodenoscopy, flexible,                             transoral; diagnostic, including collection of                            specimen(s) by brushing or washing, when performed                            (separate procedure) Diagnosis Code(s):        --- Professional ---                           C25.0, Malignant neoplasm of head of pancreas CPT copyright 2016 American Medical Association. All rights reserved. The codes documented in this report are preliminary and upon coder review may  be revised to meet current compliance requirements. Clarene Essex, MD 06/09/2017 11:44:24 AM This report has been signed electronically. Number of Addenda: 0

## 2017-06-09 NOTE — Anesthesia Postprocedure Evaluation (Signed)
Anesthesia Post Note  Patient: Crystal Carlson  Procedure(s) Performed: ENDOSCOPIC RETROGRADE CHOLANGIOPANCREATOGRAPHY (ERCP) (N/A )     Patient location during evaluation: PACU Anesthesia Type: General Level of consciousness: awake Pain management: pain level controlled Vital Signs Assessment: post-procedure vital signs reviewed and stable Respiratory status: spontaneous breathing Cardiovascular status: stable Postop Assessment: no apparent nausea or vomiting Anesthetic complications: no    Last Vitals:  Vitals:   06/09/17 1000 06/09/17 1159  BP: (!) 144/96 (!) 161/93  Pulse:  98  Resp: 17 16  Temp: 36.9 C 36.9 C  SpO2: 100% 100%    Last Pain:  Vitals:   06/09/17 1159  TempSrc: Oral  PainSc:    Pain Goal: Patients Stated Pain Goal: 2 (06/09/17 1005)               [place]

## 2017-06-13 ENCOUNTER — Other Ambulatory Visit: Payer: Self-pay | Admitting: Oncology

## 2017-06-13 NOTE — Progress Notes (Unsigned)
Called last night by patient with uncontrolled pain.  I initially suggested that they increase the MS Contin to 30 mg 3 times a day, but then the husband told me the patient had not taken her morning MS Contin because her pain was so much better after her recent stenting procedure.  Accordingly we reinforced the fact that she needs to take the MS Contin 30 mg every 12 hours, and use the Dilaudid as needed only for breakthrough pain.  Incidentally the patient was asleep when I called back.  She had taken 8 mg of Dilaudid 30 minutes previously.  They will call back if this does not take care of the problem

## 2017-06-15 ENCOUNTER — Telehealth: Payer: Self-pay | Admitting: *Deleted

## 2017-06-15 ENCOUNTER — Encounter: Payer: Self-pay | Admitting: Pharmacy Technician

## 2017-06-15 NOTE — Telephone Encounter (Signed)
Left message for pt to call office with update after speaking to on-call MD over the weekend with severe abdominal pain.

## 2017-06-15 NOTE — Progress Notes (Signed)
The patient is approved for drug assistance by [employer] for Neulasta with coverage from 06/13/17 - 06/13/18. Enrollment is based on Self Pay. There are no current orders for Neulasta. Enrollment is a proactive measure in case it is needed. Emend was added to the treatment plan since chemo education. I will meet with the patient during infusion on 06/18/17 to apply for Emend enrollment.

## 2017-06-16 ENCOUNTER — Encounter: Payer: Self-pay | Admitting: *Deleted

## 2017-06-16 NOTE — Progress Notes (Signed)
[place] Work  Clinical Social Work received referral from Therapist, sports for financial concerns.  CSW contacted patient at home to offer support and assess for needs.  CSW spoke with patients husband.  Patients husband reported that patient applied for Medicaid while in the hospital, was approved for the [place], and has submitted forms to [facility] for FMLA and short term disability.  CSW and patients husband discussed applying for Social Security disability.  Patient and family were agreeable to a referral to [facility].  CSW completed referral, and [place] will contact patient to scheduled initial meeting.  CSW provided contact information and encouraged patient and family to call with questions or concerns.    Johnnye Lana, MSW, LCSW, OSW-C Clinical Social Worker [facility] [phone]

## 2017-06-18 ENCOUNTER — Other Ambulatory Visit (HOSPITAL_BASED_OUTPATIENT_CLINIC_OR_DEPARTMENT_OTHER): Payer: Medicaid Other

## 2017-06-18 ENCOUNTER — Ambulatory Visit: Payer: Self-pay

## 2017-06-18 ENCOUNTER — Ambulatory Visit (HOSPITAL_BASED_OUTPATIENT_CLINIC_OR_DEPARTMENT_OTHER): Payer: Medicaid Other

## 2017-06-18 ENCOUNTER — Ambulatory Visit (HOSPITAL_BASED_OUTPATIENT_CLINIC_OR_DEPARTMENT_OTHER): Payer: Medicaid Other | Admitting: Oncology

## 2017-06-18 VITALS — BP 141/90 | HR 102 | Temp 99.5°F | Resp 18 | Ht 64.0 in | Wt 134.3 lb

## 2017-06-18 DIAGNOSIS — C259 Malignant neoplasm of pancreas, unspecified: Secondary | ICD-10-CM

## 2017-06-18 DIAGNOSIS — C257 Malignant neoplasm of other parts of pancreas: Secondary | ICD-10-CM

## 2017-06-18 DIAGNOSIS — D696 Thrombocytopenia, unspecified: Secondary | ICD-10-CM

## 2017-06-18 DIAGNOSIS — Z5111 Encounter for antineoplastic chemotherapy: Secondary | ICD-10-CM

## 2017-06-18 DIAGNOSIS — Z95828 Presence of other vascular implants and grafts: Secondary | ICD-10-CM

## 2017-06-18 DIAGNOSIS — D649 Anemia, unspecified: Secondary | ICD-10-CM | POA: Diagnosis not present

## 2017-06-18 DIAGNOSIS — C787 Secondary malignant neoplasm of liver and intrahepatic bile duct: Secondary | ICD-10-CM

## 2017-06-18 DIAGNOSIS — K8689 Other specified diseases of pancreas: Secondary | ICD-10-CM

## 2017-06-18 DIAGNOSIS — G893 Neoplasm related pain (acute) (chronic): Secondary | ICD-10-CM

## 2017-06-18 LAB — CBC WITH DIFFERENTIAL/PLATELET
BASO%: 0.4 % (ref 0.0–2.0)
BASOS ABS: 0 10*3/uL (ref 0.0–0.1)
EOS ABS: 0.1 10*3/uL (ref 0.0–0.5)
EOS%: 2 % (ref 0.0–7.0)
HEMATOCRIT: 30.2 % — AB (ref 34.8–46.6)
HGB: 9.3 g/dL — ABNORMAL LOW (ref 11.6–15.9)
LYMPH%: 12.5 % — AB (ref 14.0–49.7)
MCH: 24.3 pg — ABNORMAL LOW (ref 25.1–34.0)
MCHC: 30.8 g/dL — AB (ref 31.5–36.0)
MCV: 78.9 fL — AB (ref 79.5–101.0)
MONO#: 0.9 10*3/uL (ref 0.1–0.9)
MONO%: 12.9 % (ref 0.0–14.0)
NEUT%: 72.2 % (ref 38.4–76.8)
NEUTROS ABS: 5.1 10*3/uL (ref 1.5–6.5)
PLATELETS: 243 10*3/uL (ref 145–400)
RBC: 3.83 10*6/uL (ref 3.70–5.45)
RDW: 15.6 % — ABNORMAL HIGH (ref 11.2–14.5)
WBC: 7 10*3/uL (ref 3.9–10.3)
lymph#: 0.9 10*3/uL (ref 0.9–3.3)
nRBC: 0 % (ref 0–0)

## 2017-06-18 LAB — COMPREHENSIVE METABOLIC PANEL
ALT: 54 U/L (ref 0–55)
ANION GAP: 10 meq/L (ref 3–11)
AST: 65 U/L — ABNORMAL HIGH (ref 5–34)
Albumin: 3.1 g/dL — ABNORMAL LOW (ref 3.5–5.0)
Alkaline Phosphatase: 371 U/L — ABNORMAL HIGH (ref 40–150)
BILIRUBIN TOTAL: 0.57 mg/dL (ref 0.20–1.20)
BUN: 11.2 mg/dL (ref 7.0–26.0)
CALCIUM: 9.3 mg/dL (ref 8.4–10.4)
CHLORIDE: 98 meq/L (ref 98–109)
CO2: 27 meq/L (ref 22–29)
CREATININE: 1.4 mg/dL — AB (ref 0.6–1.1)
EGFR: 43 mL/min/{1.73_m2} — AB (ref >60)
Glucose: 173 mg/dl — ABNORMAL HIGH (ref 70–140)
Potassium: 3.6 mEq/L (ref 3.5–5.1)
Sodium: 135 mEq/L — ABNORMAL LOW (ref 136–145)
Total Protein: 7.5 g/dL (ref 6.4–8.3)

## 2017-06-18 MED ORDER — OXYCODONE-ACETAMINOPHEN 5-325 MG PO TABS
ORAL_TABLET | ORAL | Status: AC
  Filled 2017-06-18: qty 2

## 2017-06-18 MED ORDER — SODIUM CHLORIDE 0.9 % IV SOLN
2400.0000 mg/m2 | INTRAVENOUS | Status: DC
  Administered 2017-06-18: 4000 mg via INTRAVENOUS
  Filled 2017-06-18: qty 80

## 2017-06-18 MED ORDER — OXALIPLATIN CHEMO INJECTION 100 MG/20ML
89.0000 mg/m2 | Freq: Once | INTRAVENOUS | Status: AC
  Administered 2017-06-18: 150 mg via INTRAVENOUS
  Filled 2017-06-18: qty 30

## 2017-06-18 MED ORDER — IRINOTECAN HCL CHEMO INJECTION 100 MG/5ML
150.0000 mg/m2 | Freq: Once | INTRAVENOUS | Status: AC
  Administered 2017-06-18: 240 mg via INTRAVENOUS
  Filled 2017-06-18: qty 12

## 2017-06-18 MED ORDER — OXYCODONE-ACETAMINOPHEN 5-325 MG PO TABS
2.0000 | ORAL_TABLET | Freq: Once | ORAL | Status: AC
Start: 2017-06-18 — End: 2017-06-18
  Administered 2017-06-18: 2 via ORAL

## 2017-06-18 MED ORDER — SODIUM CHLORIDE 0.9% FLUSH
10.0000 mL | INTRAVENOUS | Status: DC | PRN
  Administered 2017-06-18: 10 mL via INTRAVENOUS
  Filled 2017-06-18: qty 10

## 2017-06-18 MED ORDER — SODIUM CHLORIDE 0.9 % IV SOLN
Freq: Once | INTRAVENOUS | Status: AC
  Administered 2017-06-18: 12:00:00 via INTRAVENOUS
  Filled 2017-06-18: qty 5

## 2017-06-18 MED ORDER — FERROUS SULFATE 325 (65 FE) MG PO TBEC: 325.0000 mg | DELAYED_RELEASE_TABLET | Freq: Three times a day (TID) | ORAL | 3 refills | Status: AC

## 2017-06-18 MED ORDER — DEXTROSE 5 % IV SOLN
Freq: Once | INTRAVENOUS | Status: AC
  Administered 2017-06-18: 12:00:00 via INTRAVENOUS

## 2017-06-18 MED ORDER — HYDROMORPHONE HCL 4 MG PO TABS: 4.0000 mg | ORAL_TABLET | ORAL | 0 refills | Status: DC | PRN

## 2017-06-18 MED ORDER — PALONOSETRON HCL INJECTION 0.25 MG/5ML
INTRAVENOUS | Status: AC
  Filled 2017-06-18: qty 5

## 2017-06-18 MED ORDER — LORAZEPAM 0.5 MG PO TABS: 0.5000 mg | ORAL_TABLET | Freq: Four times a day (QID) | ORAL | 0 refills | Status: DC | PRN

## 2017-06-18 MED ORDER — MORPHINE SULFATE ER 30 MG PO TBCR: 30.0000 mg | EXTENDED_RELEASE_TABLET | Freq: Two times a day (BID) | ORAL | 0 refills | Status: DC

## 2017-06-18 MED ORDER — HEPARIN SOD (PORK) LOCK FLUSH 100 UNIT/ML IV SOLN
500.0000 [IU] | Freq: Once | INTRAVENOUS | Status: DC | PRN
  Filled 2017-06-18: qty 5

## 2017-06-18 MED ORDER — PALONOSETRON HCL INJECTION 0.25 MG/5ML
0.2500 mg | Freq: Once | INTRAVENOUS | Status: AC
Start: 2017-06-18 — End: 2017-06-18
  Administered 2017-06-18: 0.25 mg via INTRAVENOUS

## 2017-06-18 MED ORDER — LEUCOVORIN CALCIUM INJECTION 350 MG
400.0000 mg/m2 | Freq: Once | INTRAVENOUS | Status: AC
  Administered 2017-06-18: 664 mg via INTRAVENOUS
  Filled 2017-06-18: qty 33.2

## 2017-06-18 MED ORDER — SODIUM CHLORIDE 0.9% FLUSH
10.0000 mL | INTRAVENOUS | Status: DC | PRN
  Administered 2017-06-18: 10 mL
  Filled 2017-06-18: qty 10

## 2017-06-18 MED ORDER — ATROPINE SULFATE 1 MG/ML IJ SOLN
INTRAMUSCULAR | Status: AC
  Filled 2017-06-18: qty 1

## 2017-06-18 MED ORDER — ATROPINE SULFATE 1 MG/ML IJ SOLN: 0.5000 mg | Freq: Once | INTRAMUSCULAR | Status: DC | PRN

## 2017-06-18 MED FILL — MORPHINE SULF ER 30 MG TAB: 30 | 30 days supply | Qty: 60 | Fill #0

## 2017-06-18 MED FILL — HYDROmorphone HCL 4 MG TABS: 4 | 5 days supply | Qty: 60 | Fill #0

## 2017-06-18 MED FILL — LORazepam 0.5 MG TABS: 0.5 | 8 days supply | Qty: 30 | Fill #0

## 2017-06-18 NOTE — Progress Notes (Signed)
  [facility] OFFICE PROGRESS NOTE   Diagnosis: Pancreas cancer  INTERVAL HISTORY:   [name] returns as scheduled.  She underwent an ERCP 06/09/2018.  This confirmed a distal common bile duct stricture.  A metal stent was placed into the common bile duct.  She reports improvement in abdominal pain.  She has constipation. She underwent Port-A-Cath placement 06/04/2017.  Objective:  Vital signs in last 24 hours:  Blood pressure (!) 141/90, pulse (!) 102, temperature 99.5 F (37.5 C), temperature source Oral, resp. rate 18, height 5\' 4"  (1.626 m), weight 134 lb 4.8 oz (60.9 kg), last menstrual period 06/01/2017, SpO2 100 %.    HEENT: No thrush.  The mucous membranes are moist Resp: Lungs clear bilaterally Cardio: Regular rate and rhythm GI: No hepatomegaly, nontender, no mass Vascular: No leg edema    Portacath/PICC-without erythema  Lab Results:  Lab Results  Component Value Date   WBC 7.0 06/18/2017   HGB 9.3 (L) 06/18/2017   HCT 30.2 (L) 06/18/2017   MCV 78.9 (L) 06/18/2017   PLT 243 06/18/2017   NEUTROABS 5.1 06/18/2017    CMP     Component Value Date/Time   NA 135 (L) 06/18/2017 1011   K 3.6 06/18/2017 1011   CL 102 05/26/2017 0606   CO2 27 06/18/2017 1011   GLUCOSE 173 (H) 06/18/2017 1011   BUN 11.2 06/18/2017 1011   CREATININE 1.4 (H) 06/18/2017 1011   CALCIUM 9.3 06/18/2017 1011   PROT 7.5 06/18/2017 1011   ALBUMIN 3.1 (L) 06/18/2017 1011   AST 65 (H) 06/18/2017 1011   ALT 54 06/18/2017 1011   ALKPHOS 371 (H) 06/18/2017 1011   BILITOT 0.57 06/18/2017 1011   GFRNONAA >60 05/26/2017 0606   GFRAA >60 05/26/2017 0606    Lab Results  Component Value Date   CEA1 1.3 05/25/2017  06/02/2017: Ferritin-32  Medications: I have reviewed the patient's current medications.  Assessment/Plan: 1. 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 Adenocarcinoma consistent with pancreatobiliary primary.  Cycle 1 FOLFIRINOX 06/18/2018  2. Pain secondary to #1.  3. Weight losssecondary to #1.  4. Microcytic anemia  5. Mild thrombocytopenia  6.  Port-A-Cath placement 06/04/2017  7.  Elevated liver enzymes/bili 06/08/2017-likely early biliary obstruction secondary to pancreas cancer, status post ERCP-placement of a common bile duct stent 06/09/2018     Disposition:  Her performance status appears improved.  The elevated liver enzymes have improved following placement of the bile duct stent.  The plan is to proceed with cycle 1 FOLFIRINOX today. She will return for lab visit in 1 week.  She will be scheduled for an office visit and cycle 2 FOLFIRINOX in 2 weeks. The anemia is likely related to chronic disease.  The ferritin was borderline low on 06/02/2018.  She will begin an iron supplement.  She will continue the current laxative regimen.  She will discontinue laxatives if she develops diarrhea.  25 minutes were spent with the patient today.  The majority of the time was used for counseling and coordination of care.  Donneta Romberg, MD  06/18/2017  2:09 PM

## 2017-06-18 NOTE — Patient Instructions (Signed)
Implanted Port Home Guide An implanted port is a type of central line that is placed under the skin. Central lines are used to provide IV access when treatment or nutrition needs to be given through a person's veins. Implanted ports are used for long-term IV access. An implanted port may be placed because:  You need IV medicine that would be irritating to the small veins in your hands or arms.  You need long-term IV medicines, such as antibiotics.  You need IV nutrition for a long period.  You need frequent blood draws for lab tests.  You need dialysis.  Implanted ports are usually placed in the chest area, but they can also be placed in the upper arm, the abdomen, or the leg. An implanted port has two main parts:  Reservoir. The reservoir is round and will appear as a small, raised area under your skin. The reservoir is the part where a needle is inserted to give medicines or draw blood.  Catheter. The catheter is a thin, flexible tube that extends from the reservoir. The catheter is placed into a large vein. Medicine that is inserted into the reservoir goes into the catheter and then into the vein.  How will I care for my incision site? Do not get the incision site wet. Bathe or shower as directed by your health care provider. How is my port accessed? Special steps must be taken to access the port:  Before the port is accessed, a numbing cream can be placed on the skin. This helps numb the skin over the port site.  Your health care provider uses a sterile technique to access the port. ? Your health care provider must put on a mask and sterile gloves. ? The skin over your port is cleaned carefully with an antiseptic and allowed to dry. ? The port is gently pinched between sterile gloves, and a needle is inserted into the port.  Only "non-coring" port needles should be used to access the port. Once the port is accessed, a blood return should be checked. This helps ensure that the port  is in the vein and is not clogged.  If your port needs to remain accessed for a constant infusion, a clear (transparent) bandage will be placed over the needle site. The bandage and needle will need to be changed every week, or as directed by your health care provider.  Keep the bandage covering the needle clean and dry. Do not get it wet. Follow your health care provider's instructions on how to take a shower or bath while the port is accessed.  If your port does not need to stay accessed, no bandage is needed over the port.  What is flushing? Flushing helps keep the port from getting clogged. Follow your health care provider's instructions on how and when to flush the port. Ports are usually flushed with saline solution or a medicine called heparin. The need for flushing will depend on how the port is used.  If the port is used for intermittent medicines or blood draws, the port will need to be flushed: ? After medicines have been given. ? After blood has been drawn. ? As part of routine maintenance.  If a constant infusion is running, the port may not need to be flushed.  How long will my port stay implanted? The port can stay in for as long as your health care provider thinks it is needed. When it is time for the port to come out, surgery will be   done to remove it. The procedure is similar to the one performed when the port was put in. When should I seek immediate medical care? When you have an implanted port, you should seek immediate medical care if:  You notice a bad smell coming from the incision site.  You have swelling, redness, or drainage at the incision site.  You have more swelling or pain at the port site or the surrounding area.  You have a fever that is not controlled with medicine.  This information is not intended to replace advice given to you by your health care provider. Make sure you discuss any questions you have with your health care provider. Document  Released: 08/04/2005 Document Revised: 01/10/2016 Document Reviewed: 04/11/2013 Elsevier Interactive Patient Education  2017 [employer]  

## 2017-06-18 NOTE — Addendum Note (Signed)
Addended by: Betsy Coder B on: 06/18/2017 02:46 PM   Modules accepted: Orders

## 2017-06-18 NOTE — Patient Instructions (Addendum)
[facility] Discharge Instructions for Patients Receiving Chemotherapy   COME Saturday AT 1:30 to take pump off and Get [place] 1 ALEVE ON Friday AND FOR NEXT & DAYS (Neulasta) Today you received the following chemotherapy agents Luecovorin, Oxaliplatin, Irinotecan, 5FU  To help prevent nausea and vomiting after your treatment, we encourage you to take your nausea medicationas prescribed.   If you develop nausea and vomiting that is not controlled by your nausea medication, call the clinic.   BELOW ARE SYMPTOMS THAT SHOULD BE REPORTED IMMEDIATELY:  *FEVER GREATER THAN 100.5 F  *CHILLS WITH OR WITHOUT FEVER  NAUSEA AND VOMITING THAT IS NOT CONTROLLED WITH YOUR NAUSEA MEDICATION  *UNUSUAL SHORTNESS OF BREATH  *UNUSUAL BRUISING OR BLEEDING  TENDERNESS IN MOUTH AND THROAT WITH OR WITHOUT PRESENCE OF ULCERS  *URINARY PROBLEMS  *BOWEL PROBLEMS  UNUSUAL RASH Items with * indicate a potential emergency and should be followed up as soon as possible.  Feel free to call the clinic should you have any questions or concerns. The clinic phone number is [phone].  Please show the [place] at check-in to the Emergency Department and triage nurse.  Oxaliplatin Injection What is this medicine? OXALIPLATIN (ox AL i PLA tin) is a chemotherapy drug. It targets fast dividing cells, like cancer cells, and causes these cells to die. This medicine is used to treat cancers of the colon and rectum, and many other cancers. This medicine may be used for other purposes; ask your health care provider or pharmacist if you have questions. COMMON BRAND NAME(S): Eloxatin What should I tell my health care provider before I take this medicine? They need to know if you have any of these conditions: -kidney disease -an unusual or allergic reaction to oxaliplatin, other chemotherapy, other medicines, foods, dyes, or preservatives -pregnant or trying to get  pregnant -breast-feeding How should I use this medicine? This drug is given as an infusion into a vein. It is administered in a hospital or clinic by a specially trained health care professional. Talk to your pediatrician regarding the use of this medicine in children. Special care may be needed. Overdosage: If you think you have taken too much of this medicine contact a poison control center or emergency room at once. NOTE: This medicine is only for you. Do not share this medicine with others. What if I miss a dose? It is important not to miss a dose. Call your doctor or health care professional if you are unable to keep an appointment. What may interact with this medicine? -medicines to increase blood counts like filgrastim, pegfilgrastim, sargramostim -probenecid -some antibiotics like amikacin, gentamicin, neomycin, polymyxin B, streptomycin, tobramycin -zalcitabine Talk to your doctor or health care professional before taking any of these medicines: -acetaminophen -aspirin -ibuprofen -ketoprofen -naproxen This list may not describe all possible interactions. Give your health care provider a list of all the medicines, herbs, non-prescription drugs, or dietary supplements you use. Also tell them if you smoke, drink alcohol, or use illegal drugs. Some items may interact with your medicine. What should I watch for while using this medicine? Your condition will be monitored carefully while you are receiving this medicine. You will need important blood work done while you are taking this medicine. This medicine can make you more sensitive to cold. Do not drink cold drinks or use ice. Cover exposed skin before coming in contact with cold temperatures or cold objects. When out in cold weather wear warm clothing and  cover your mouth and nose to warm the air that goes into your lungs. Tell your doctor if you get sensitive to the cold. This drug may make you feel generally unwell. This is not  uncommon, as chemotherapy can affect healthy cells as well as cancer cells. Report any side effects. Continue your course of treatment even though you feel ill unless your doctor tells you to stop. In some cases, you may be given additional medicines to help with side effects. Follow all directions for their use. Call your doctor or health care professional for advice if you get a fever, chills or sore throat, or other symptoms of a cold or flu. Do not treat yourself. This drug decreases your body's ability to fight infections. Try to avoid being around people who are sick. This medicine may increase your risk to bruise or bleed. Call your doctor or health care professional if you notice any unusual bleeding. Be careful brushing and flossing your teeth or using a toothpick because you may get an infection or bleed more easily. If you have any dental work done, tell your dentist you are receiving this medicine. Avoid taking products that contain aspirin, acetaminophen, ibuprofen, naproxen, or ketoprofen unless instructed by your doctor. These medicines may hide a fever. Do not become pregnant while taking this medicine. Women should inform their doctor if they wish to become pregnant or think they might be pregnant. There is a potential for serious side effects to an unborn child. Talk to your health care professional or pharmacist for more information. Do not breast-feed an infant while taking this medicine. Call your doctor or health care professional if you get diarrhea. Do not treat yourself. What side effects may I notice from receiving this medicine? Side effects that you should report to your doctor or health care professional as soon as possible: -allergic reactions like skin rash, itching or hives, swelling of the face, lips, or tongue -low blood counts - This drug may decrease the number of white blood cells, red blood cells and platelets. You may be at increased risk for infections and  bleeding. -signs of infection - fever or chills, cough, sore throat, pain or difficulty passing urine -signs of decreased platelets or bleeding - bruising, pinpoint red spots on the skin, black, tarry stools, nosebleeds -signs of decreased red blood cells - unusually weak or tired, fainting spells, lightheadedness -breathing problems -chest pain, pressure -cough -diarrhea -jaw tightness -mouth sores -nausea and vomiting -pain, swelling, redness or irritation at the injection site -pain, tingling, numbness in the hands or feet -problems with balance, talking, walking -redness, blistering, peeling or loosening of the skin, including inside the mouth -trouble passing urine or change in the amount of urine Side effects that usually do not require medical attention (report to your doctor or health care professional if they continue or are bothersome): -changes in vision -constipation -hair loss -loss of appetite -metallic taste in the mouth or changes in taste -stomach pain This list may not describe all possible side effects. Call your doctor for medical advice about side effects. You may report side effects to FDA at 1-800-FDA-1088. Where should I keep my medicine? This drug is given in a hospital or clinic and will not be stored at home. NOTE: This sheet is a summary. It may not cover all possible information. If you have questions about this medicine, talk to your doctor, pharmacist, or health care provider.  2018 Elsevier/Gold Standard (2008-02-29 17:22:47)   Irinotecan injection What is  this medicine? IRINOTECAN (ir in oh TEE kan ) is a chemotherapy drug. It is used to treat colon and rectal cancer. This medicine may be used for other purposes; ask your health care provider or pharmacist if you have questions. COMMON BRAND NAME(S): Camptosar What should I tell my health care provider before I take this medicine? They need to know if you have any of these conditions: -blood  disorders -dehydration -diarrhea -infection (especially a virus infection such as chickenpox, cold sores, or herpes) -liver disease -low blood counts, like low white cell, platelet, or red cell counts -recent or ongoing radiation therapy -an unusual or allergic reaction to irinotecan, sorbitol, other chemotherapy, other medicines, foods, dyes, or preservatives -pregnant or trying to get pregnant -breast-feeding How should I use this medicine? This drug is given as an infusion into a vein. It is administered in a hospital or clinic by a specially trained health care professional. Talk to your pediatrician regarding the use of this medicine in children. Special care may be needed. Overdosage: If you think you have taken too much of this medicine contact a poison control center or emergency room at once. NOTE: This medicine is only for you. Do not share this medicine with others. What if I miss a dose? It is important not to miss your dose. Call your doctor or health care professional if you are unable to keep an appointment. What may interact with this medicine? Do not take this medicine with any of the following medications: -atazanavir -certain medicines for fungal infections like itraconazole and ketoconazole -St. John's Wort This medicine may also interact with the following medications: -dexamethasone -diuretics -laxatives -medicines for seizures like carbamazepine, mephobarbital, phenobarbital, phenytoin, primidone -medicines to increase blood counts like filgrastim, pegfilgrastim, sargramostim -prochlorperazine -vaccines This list may not describe all possible interactions. Give your health care provider a list of all the medicines, herbs, non-prescription drugs, or dietary supplements you use. Also tell them if you smoke, drink alcohol, or use illegal drugs. Some items may interact with your medicine. What should I watch for while using this medicine? Your condition will be  monitored carefully while you are receiving this medicine. You will need important blood work done while you are taking this medicine. This drug may make you feel generally unwell. This is not uncommon, as chemotherapy can affect healthy cells as well as cancer cells. Report any side effects. Continue your course of treatment even though you feel ill unless your doctor tells you to stop. In some cases, you may be given additional medicines to help with side effects. Follow all directions for their use. You may get drowsy or dizzy. Do not drive, use machinery, or do anything that needs mental alertness until you know how this medicine affects you. Do not stand or sit up quickly, especially if you are an older patient. This reduces the risk of dizzy or fainting spells. Call your doctor or health care professional for advice if you get a fever, chills or sore throat, or other symptoms of a cold or flu. Do not treat yourself. This drug decreases your body's ability to fight infections. Try to avoid being around people who are sick. This medicine may increase your risk to bruise or bleed. Call your doctor or health care professional if you notice any unusual bleeding. Be careful brushing and flossing your teeth or using a toothpick because you may get an infection or bleed more easily. If you have any dental work done, tell your dentist you  are receiving this medicine. Avoid taking products that contain aspirin, acetaminophen, ibuprofen, naproxen, or ketoprofen unless instructed by your doctor. These medicines may hide a fever. Do not become pregnant while taking this medicine. Women should inform their doctor if they wish to become pregnant or think they might be pregnant. There is a potential for serious side effects to an unborn child. Talk to your health care professional or pharmacist for more information. Do not breast-feed an infant while taking this medicine. What side effects may I notice from receiving  this medicine? Side effects that you should report to your doctor or health care professional as soon as possible: -allergic reactions like skin rash, itching or hives, swelling of the face, lips, or tongue -low blood counts - this medicine may decrease the number of white blood cells, red blood cells and platelets. You may be at increased risk for infections and bleeding. -signs of infection - fever or chills, cough, sore throat, pain or difficulty passing urine -signs of decreased platelets or bleeding - bruising, pinpoint red spots on the skin, black, tarry stools, blood in the urine -signs of decreased red blood cells - unusually weak or tired, fainting spells, lightheadedness -breathing problems -chest pain -diarrhea -feeling faint or lightheaded, falls -flushing, runny nose, sweating during infusion -mouth sores or pain -pain, swelling, redness or irritation where injected -pain, swelling, warmth in the leg -pain, tingling, numbness in the hands or feet -problems with balance, talking, walking -stomach cramps, pain -trouble passing urine or change in the amount of urine -vomiting as to be unable to hold down drinks or food -yellowing of the eyes or skin Side effects that usually do not require medical attention (report to your doctor or health care professional if they continue or are bothersome): -constipation -hair loss -headache -loss of appetite -nausea, vomiting -stomach upset This list may not describe all possible side effects. Call your doctor for medical advice about side effects. You may report side effects to FDA at 1-800-FDA-1088. Where should I keep my medicine? This drug is given in a hospital or clinic and will not be stored at home. NOTE: This sheet is a summary. It may not cover all possible information. If you have questions about this medicine, talk to your doctor, pharmacist, or health care provider.  2018 Elsevier/Gold Standard (2013-01-31  16:29:32)   Fluorouracil, 5-FU injection What is this medicine? FLUOROURACIL, 5-FU (flure oh YOOR a sil) is a chemotherapy drug. It slows the growth of cancer cells. This medicine is used to treat many types of cancer like breast cancer, colon or rectal cancer, pancreatic cancer, and stomach cancer. This medicine may be used for other purposes; ask your health care provider or pharmacist if you have questions. COMMON BRAND NAME(S): Adrucil What should I tell my health care provider before I take this medicine? They need to know if you have any of these conditions: -blood disorders -dihydropyrimidine dehydrogenase (DPD) deficiency -infection (especially a virus infection such as chickenpox, cold sores, or herpes) -kidney disease -liver disease -malnourished, poor nutrition -recent or ongoing radiation therapy -an unusual or allergic reaction to fluorouracil, other chemotherapy, other medicines, foods, dyes, or preservatives -pregnant or trying to get pregnant -breast-feeding How should I use this medicine? This drug is given as an infusion or injection into a vein. It is administered in a hospital or clinic by a specially trained health care professional. Talk to your pediatrician regarding the use of this medicine in children. Special care may be needed. Overdosage: If  you think you have taken too much of this medicine contact a poison control center or emergency room at once. NOTE: This medicine is only for you. Do not share this medicine with others. What if I miss a dose? It is important not to miss your dose. Call your doctor or health care professional if you are unable to keep an appointment. What may interact with this medicine? -allopurinol -cimetidine -dapsone -digoxin -hydroxyurea -leucovorin -levamisole -medicines for seizures like ethotoin, fosphenytoin, phenytoin -medicines to increase blood counts like filgrastim, pegfilgrastim, sargramostim -medicines that treat or  prevent blood clots like warfarin, enoxaparin, and dalteparin -methotrexate -metronidazole -pyrimethamine -some other chemotherapy drugs like busulfan, cisplatin, estramustine, vinblastine -trimethoprim -trimetrexate -vaccines Talk to your doctor or health care professional before taking any of these medicines: -acetaminophen -aspirin -ibuprofen -ketoprofen -naproxen This list may not describe all possible interactions. Give your health care provider a list of all the medicines, herbs, non-prescription drugs, or dietary supplements you use. Also tell them if you smoke, drink alcohol, or use illegal drugs. Some items may interact with your medicine. What should I watch for while using this medicine? Visit your doctor for checks on your progress. This drug may make you feel generally unwell. This is not uncommon, as chemotherapy can affect healthy cells as well as cancer cells. Report any side effects. Continue your course of treatment even though you feel ill unless your doctor tells you to stop. In some cases, you may be given additional medicines to help with side effects. Follow all directions for their use. Call your doctor or health care professional for advice if you get a fever, chills or sore throat, or other symptoms of a cold or flu. Do not treat yourself. This drug decreases your body's ability to fight infections. Try to avoid being around people who are sick. This medicine may increase your risk to bruise or bleed. Call your doctor or health care professional if you notice any unusual bleeding. Be careful brushing and flossing your teeth or using a toothpick because you may get an infection or bleed more easily. If you have any dental work done, tell your dentist you are receiving this medicine. Avoid taking products that contain aspirin, acetaminophen, ibuprofen, naproxen, or ketoprofen unless instructed by your doctor. These medicines may hide a fever. Do not become pregnant while  taking this medicine. Women should inform their doctor if they wish to become pregnant or think they might be pregnant. There is a potential for serious side effects to an unborn child. Talk to your health care professional or pharmacist for more information. Do not breast-feed an infant while taking this medicine. Men should inform their doctor if they wish to father a child. This medicine may lower sperm counts. Do not treat diarrhea with over the counter products. Contact your doctor if you have diarrhea that lasts more than 2 days or if it is severe and watery. This medicine can make you more sensitive to the sun. Keep out of the sun. If you cannot avoid being in the sun, wear protective clothing and use sunscreen. Do not use sun lamps or tanning beds/booths. What side effects may I notice from receiving this medicine? Side effects that you should report to your doctor or health care professional as soon as possible: -allergic reactions like skin rash, itching or hives, swelling of the face, lips, or tongue -low blood counts - this medicine may decrease the number of white blood cells, red blood cells and platelets. You may  be at increased risk for infections and bleeding. -signs of infection - fever or chills, cough, sore throat, pain or difficulty passing urine -signs of decreased platelets or bleeding - bruising, pinpoint red spots on the skin, black, tarry stools, blood in the urine -signs of decreased red blood cells - unusually weak or tired, fainting spells, lightheadedness -breathing problems -changes in vision -chest pain -mouth sores -nausea and vomiting -pain, swelling, redness at site where injected -pain, tingling, numbness in the hands or feet -redness, swelling, or sores on hands or feet -stomach pain -unusual bleeding Side effects that usually do not require medical attention (report to your doctor or health care professional if they continue or are bothersome): -changes in  finger or toe nails -diarrhea -dry or itchy skin -hair loss -headache -loss of appetite -sensitivity of eyes to the light -stomach upset -unusually teary eyes This list may not describe all possible side effects. Call your doctor for medical advice about side effects. You may report side effects to FDA at 1-800-FDA-1088. Where should I keep my medicine? This drug is given in a hospital or clinic and will not be stored at home. NOTE: This sheet is a summary. It may not cover all possible information. If you have questions about this medicine, talk to your doctor, pharmacist, or health care provider.  2018 Elsevier/Gold Standard (2007-12-08 13:53:16)   Leucovorin injection What is this medicine? LEUCOVORIN (loo koe VOR in) is used to prevent or treat the harmful effects of some medicines. This medicine is used to treat anemia caused by a low amount of folic acid in the body. It is also used with 5-fluorouracil (5-FU) to treat colon cancer. This medicine may be used for other purposes; ask your health care provider or pharmacist if you have questions. What should I tell my health care provider before I take this medicine? They need to know if you have any of these conditions: -anemia from low levels of vitamin B-12 in the blood -an unusual or allergic reaction to leucovorin, folic acid, other medicines, foods, dyes, or preservatives -pregnant or trying to get pregnant -breast-feeding How should I use this medicine? This medicine is for injection into a muscle or into a vein. It is given by a health care professional in a hospital or clinic setting. Talk to your pediatrician regarding the use of this medicine in children. Special care may be needed. Overdosage: If you think you have taken too much of this medicine contact a poison control center or emergency room at once. NOTE: This medicine is only for you. Do not share this medicine with others. What if I miss a dose? This does not  apply. What may interact with this medicine? -capecitabine -fluorouracil -phenobarbital -phenytoin -primidone -trimethoprim-sulfamethoxazole This list may not describe all possible interactions. Give your health care provider a list of all the medicines, herbs, non-prescription drugs, or dietary supplements you use. Also tell them if you smoke, drink alcohol, or use illegal drugs. Some items may interact with your medicine. What should I watch for while using this medicine? Your condition will be monitored carefully while you are receiving this medicine. This medicine may increase the side effects of 5-fluorouracil, 5-FU. Tell your doctor or health care professional if you have diarrhea or mouth sores that do not get better or that get worse. What side effects may I notice from receiving this medicine? Side effects that you should report to your doctor or health care professional as soon as possible: -allergic reactions like  skin rash, itching or hives, swelling of the face, lips, or tongue -breathing problems -fever, infection -mouth sores -unusual bleeding or bruising -unusually weak or tired Side effects that usually do not require medical attention (report to your doctor or health care professional if they continue or are bothersome): -constipation or diarrhea -loss of appetite -nausea, vomiting This list may not describe all possible side effects. Call your doctor for medical advice about side effects. You may report side effects to FDA at 1-800-FDA-1088. Where should I keep my medicine? This drug is given in a hospital or clinic and will not be stored at home. NOTE: This sheet is a summary. It may not cover all possible information. If you have questions about this medicine, talk to your doctor, pharmacist, or health care provider.  2018 Elsevier/Gold Standard (2008-02-08 16:50:29) Pegfilgrastim injection (Neulasta) What is this medicine? PEGFILGRASTIM (PEG fil gra stim) is a  long-acting granulocyte colony-stimulating factor that stimulates the growth of neutrophils, a type of white blood cell important in the body's fight against infection. It is used to reduce the incidence of fever and infection in patients with certain types of cancer who are receiving chemotherapy that affects the bone marrow, and to increase survival after being exposed to high doses of radiation. This medicine may be used for other purposes; ask your health care provider or pharmacist if you have questions. COMMON BRAND NAME(S): Neulasta What should I tell my health care provider before I take this medicine? They need to know if you have any of these conditions: -kidney disease -latex allergy -ongoing radiation therapy -sickle cell disease -skin reactions to acrylic adhesives (On-Body Injector only) -an unusual or allergic reaction to pegfilgrastim, filgrastim, other medicines, foods, dyes, or preservatives -pregnant or trying to get pregnant -breast-feeding How should I use this medicine? This medicine is for injection under the skin. If you get this medicine at home, you will be taught how to prepare and give the pre-filled syringe or how to use the On-body Injector. Refer to the patient Instructions for Use for detailed instructions. Use exactly as directed. Tell your healthcare provider immediately if you suspect that the On-body Injector may not have performed as intended or if you suspect the use of the On-body Injector resulted in a missed or partial dose. It is important that you put your used needles and syringes in a special sharps container. Do not put them in a trash can. If you do not have a sharps container, call your pharmacist or healthcare provider to get one. Talk to your pediatrician regarding the use of this medicine in children. While this drug may be prescribed for selected conditions, precautions do apply. Overdosage: If you think you have taken too much of this medicine  contact a poison control center or emergency room at once. NOTE: This medicine is only for you. Do not share this medicine with others. What if I miss a dose? It is important not to miss your dose. Call your doctor or health care professional if you miss your dose. If you miss a dose due to an On-body Injector failure or leakage, a new dose should be administered as soon as possible using a single prefilled syringe for manual use. What may interact with this medicine? Interactions have not been studied. Give your health care provider a list of all the medicines, herbs, non-prescription drugs, or dietary supplements you use. Also tell them if you smoke, drink alcohol, or use illegal drugs. Some items may interact with your  medicine. This list may not describe all possible interactions. Give your health care provider a list of all the medicines, herbs, non-prescription drugs, or dietary supplements you use. Also tell them if you smoke, drink alcohol, or use illegal drugs. Some items may interact with your medicine. What should I watch for while using this medicine? You may need blood work done while you are taking this medicine. If you are going to need a MRI, CT scan, or other procedure, tell your doctor that you are using this medicine (On-Body Injector only). What side effects may I notice from receiving this medicine? Side effects that you should report to your doctor or health care professional as soon as possible: -allergic reactions like skin rash, itching or hives, swelling of the face, lips, or tongue -dizziness -fever -pain, redness, or irritation at site where injected -pinpoint red spots on the skin -red or dark-brown urine -shortness of breath or breathing problems -stomach or side pain, or pain at the shoulder -swelling -tiredness -trouble passing urine or change in the amount of urine Side effects that usually do not require medical attention (report to your doctor or health care  professional if they continue or are bothersome): -bone pain -muscle pain This list may not describe all possible side effects. Call your doctor for medical advice about side effects. You may report side effects to FDA at 1-800-FDA-1088. Where should I keep my medicine? Keep out of the reach of children. Store pre-filled syringes in a refrigerator between 2 and 8 degrees C (36 and 46 degrees F). Do not freeze. Keep in carton to protect from light. Throw away this medicine if it is left out of the refrigerator for more than 48 hours. Throw away any unused medicine after the expiration date. NOTE: This sheet is a summary. It may not cover all possible information. If you have questions about this medicine, talk to your doctor, pharmacist, or health care provider.  2018 Elsevier/Gold Standard (2016-07-31 12:58:03)

## 2017-06-18 NOTE — Progress Notes (Signed)
  Oncology Nurse Navigator Documentation  Navigator Location: [facility] (06/18/17 1303)   )                    Treatment Initiated Date: 06/18/17 (06/18/17 1303) Patient Visit Type: MedOnc (06/18/17 1303) Treatment Phase: First Chemo Tx (06/18/17 1303) Barriers/Navigation Needs: No Needs;No Questions (06/18/17 1303)   Interventions: Psycho-social support (06/18/17 1303)  Met with patient in the infusion room. Today is Crystal Carlson's first chemo treatment. Patient was sleepy during my visit and voiced no concerns or questions. I gave patient an envelope from [facility] as requested. Patient's niece at chairside and I reminded them to please call me with any questions or concerns.          Acuity: Level 1 (06/18/17 1303) Acuity Level 1: Minimal follow up required (06/18/17 1303)       Time Spent with Patient: 15 (06/18/17 1303)

## 2017-06-18 NOTE — Progress Notes (Signed)
Per Lavella Lemons, leave pump rate as prescribed. Scheduled to take off pump on Saturday at 1:30.

## 2017-06-19 ENCOUNTER — Telehealth: Payer: Self-pay | Admitting: Oncology

## 2017-06-19 NOTE — Telephone Encounter (Signed)
Scheduled appt per 11/1 los - unable to scheduled treatment - need help sent message to Audie Clear - will contact patient when all appts are scheduled.

## 2017-06-20 ENCOUNTER — Ambulatory Visit (HOSPITAL_BASED_OUTPATIENT_CLINIC_OR_DEPARTMENT_OTHER): Payer: Medicaid Other

## 2017-06-20 VITALS — BP 167/110 | HR 98 | Temp 98.8°F | Resp 17

## 2017-06-20 DIAGNOSIS — C257 Malignant neoplasm of other parts of pancreas: Secondary | ICD-10-CM

## 2017-06-20 DIAGNOSIS — C787 Secondary malignant neoplasm of liver and intrahepatic bile duct: Principal | ICD-10-CM

## 2017-06-20 DIAGNOSIS — Z7189 Other specified counseling: Secondary | ICD-10-CM

## 2017-06-20 DIAGNOSIS — C259 Malignant neoplasm of pancreas, unspecified: Secondary | ICD-10-CM

## 2017-06-20 MED ORDER — HEPARIN SOD (PORK) LOCK FLUSH 100 UNIT/ML IV SOLN
500.0000 [IU] | Freq: Once | INTRAVENOUS | Status: AC | PRN
  Administered 2017-06-20: 500 [IU]
  Filled 2017-06-20: qty 5

## 2017-06-20 MED ORDER — SODIUM CHLORIDE 0.9% FLUSH
10.0000 mL | INTRAVENOUS | Status: DC | PRN
  Administered 2017-06-20: 10 mL
  Filled 2017-06-20: qty 10

## 2017-06-20 MED ORDER — PEGFILGRASTIM INJECTION 6 MG/0.6ML ~~LOC~~
6.0000 mg | PREFILLED_SYRINGE | Freq: Once | SUBCUTANEOUS | Status: AC
  Administered 2017-06-20: 6 mg via SUBCUTANEOUS

## 2017-06-20 NOTE — Progress Notes (Signed)
MD on call made aware (Ennever) about patient's multiple BP readings from today. He stated she was fine to go home. She denies any symptoms of HTN and stated Dr. Benay Spice has been attributing her BP to increased pain. Patient educated on when to seek emergency medical care and was discharged home with husband. Will send a message to Dr. Benay Spice and Lattie Haw with BP readings from today.

## 2017-06-20 NOTE — Patient Instructions (Signed)
Pegfilgrastim injection What is this medicine? PEGFILGRASTIM (PEG fil gra stim) is a long-acting granulocyte colony-stimulating factor that stimulates the growth of neutrophils, a type of white blood cell important in the body's fight against infection. It is used to reduce the incidence of fever and infection in patients with certain types of cancer who are receiving chemotherapy that affects the bone marrow, and to increase survival after being exposed to high doses of radiation. This medicine may be used for other purposes; ask your health care provider or pharmacist if you have questions. COMMON BRAND NAME(S): Neulasta What should I tell my health care provider before I take this medicine? They need to know if you have any of these conditions: -kidney disease -latex allergy -ongoing radiation therapy -sickle cell disease -skin reactions to acrylic adhesives (On-Body Injector only) -an unusual or allergic reaction to pegfilgrastim, filgrastim, other medicines, foods, dyes, or preservatives -pregnant or trying to get pregnant -breast-feeding How should I use this medicine? This medicine is for injection under the skin. If you get this medicine at home, you will be taught how to prepare and give the pre-filled syringe or how to use the On-body Injector. Refer to the patient Instructions for Use for detailed instructions. Use exactly as directed. Tell your healthcare provider immediately if you suspect that the On-body Injector may not have performed as intended or if you suspect the use of the On-body Injector resulted in a missed or partial dose. It is important that you put your used needles and syringes in a special sharps container. Do not put them in a trash can. If you do not have a sharps container, call your pharmacist or healthcare provider to get one. Talk to your pediatrician regarding the use of this medicine in children. While this drug may be prescribed for selected conditions,  precautions do apply. Overdosage: If you think you have taken too much of this medicine contact a poison control center or emergency room at once. NOTE: This medicine is only for you. Do not share this medicine with others. What if I miss a dose? It is important not to miss your dose. Call your doctor or health care professional if you miss your dose. If you miss a dose due to an On-body Injector failure or leakage, a new dose should be administered as soon as possible using a single prefilled syringe for manual use. What may interact with this medicine? Interactions have not been studied. Give your health care provider a list of all the medicines, herbs, non-prescription drugs, or dietary supplements you use. Also tell them if you smoke, drink alcohol, or use illegal drugs. Some items may interact with your medicine. This list may not describe all possible interactions. Give your health care provider a list of all the medicines, herbs, non-prescription drugs, or dietary supplements you use. Also tell them if you smoke, drink alcohol, or use illegal drugs. Some items may interact with your medicine. What should I watch for while using this medicine? You may need blood work done while you are taking this medicine. If you are going to need a MRI, CT scan, or other procedure, tell your doctor that you are using this medicine (On-Body Injector only). What side effects may I notice from receiving this medicine? Side effects that you should report to your doctor or health care professional as soon as possible: -allergic reactions like skin rash, itching or hives, swelling of the face, lips, or tongue -dizziness -fever -pain, redness, or irritation at site   where injected -pinpoint red spots on the skin -red or dark-brown urine -shortness of breath or breathing problems -stomach or side pain, or pain at the shoulder -swelling -tiredness -trouble passing urine or change in the amount of urine Side  effects that usually do not require medical attention (report to your doctor or health care professional if they continue or are bothersome): -bone pain -muscle pain This list may not describe all possible side effects. Call your doctor for medical advice about side effects. You may report side effects to FDA at 1-800-FDA-1088. Where should I keep my medicine? Keep out of the reach of children. Store pre-filled syringes in a refrigerator between 2 and 8 degrees C (36 and 46 degrees F). Do not freeze. Keep in carton to protect from light. Throw away this medicine if it is left out of the refrigerator for more than 48 hours. Throw away any unused medicine after the expiration date. NOTE: This sheet is a summary. It may not cover all possible information. If you have questions about this medicine, talk to your doctor, pharmacist, or health care provider.  2018 Elsevier/Gold Standard (2016-07-31 12:58:03)  

## 2017-06-21 ENCOUNTER — Emergency Department (HOSPITAL_COMMUNITY): Payer: Medicaid Other

## 2017-06-21 ENCOUNTER — Emergency Department (HOSPITAL_COMMUNITY)
Admission: EM | Admit: 2017-06-21 | Discharge: 2017-06-21 | Disposition: A | Discharge status: Home / Self Care | Payer: Medicaid Other | Attending: Emergency Medicine | Admitting: Emergency Medicine

## 2017-06-21 DIAGNOSIS — R509 Fever, unspecified: Secondary | ICD-10-CM | POA: Diagnosis not present

## 2017-06-21 DIAGNOSIS — Z79899 Other long term (current) drug therapy: Secondary | ICD-10-CM | POA: Diagnosis not present

## 2017-06-21 DIAGNOSIS — D72829 Elevated white blood cell count, unspecified: Secondary | ICD-10-CM | POA: Diagnosis not present

## 2017-06-21 LAB — CBC WITH DIFFERENTIAL/PLATELET
BASOS ABS: 0 10*3/uL (ref 0.0–0.1)
Basophils Relative: 0 %
Eosinophils Absolute: 0 10*3/uL (ref 0.0–0.7)
Eosinophils Relative: 0 %
HCT: 33.8 % — ABNORMAL LOW (ref 36.0–46.0)
HEMOGLOBIN: 10.8 g/dL — AB (ref 12.0–15.0)
LYMPHS ABS: 0.8 10*3/uL (ref 0.7–4.0)
LYMPHS PCT: 3 %
MCH: 24.8 pg — AB (ref 26.0–34.0)
MCHC: 32 g/dL (ref 30.0–36.0)
MCV: 77.7 fL — AB (ref 78.0–100.0)
Monocytes Absolute: 0.1 10*3/uL (ref 0.1–1.0)
Monocytes Relative: 0 %
NEUTROS ABS: 31.3 10*3/uL — AB (ref 1.7–7.7)
NEUTROS PCT: 97 %
PLATELETS: 258 10*3/uL (ref 150–400)
RBC: 4.35 MIL/uL (ref 3.87–5.11)
RDW: 15.8 % — ABNORMAL HIGH (ref 11.5–15.5)
WBC: 32.3 10*3/uL — AB (ref 4.0–10.5)

## 2017-06-21 LAB — COMPREHENSIVE METABOLIC PANEL
ALK PHOS: 412 U/L — AB (ref 38–126)
ALT: 128 U/L — AB (ref 14–54)
AST: 233 U/L — AB (ref 15–41)
Albumin: 3.5 g/dL (ref 3.5–5.0)
Anion gap: 14 (ref 5–15)
BUN: 24 mg/dL — AB (ref 6–20)
CALCIUM: 8.9 mg/dL (ref 8.9–10.3)
CHLORIDE: 93 mmol/L — AB (ref 101–111)
CO2: 26 mmol/L (ref 22–32)
CREATININE: 1.66 mg/dL — AB (ref 0.44–1.00)
GFR, EST AFRICAN AMERICAN: 41 mL/min — AB (ref >60)
GFR, EST NON AFRICAN AMERICAN: 35 mL/min — AB (ref >60)
Glucose, Bld: 117 mg/dL — ABNORMAL HIGH (ref 65–99)
Potassium: 3.7 mmol/L (ref 3.5–5.1)
Sodium: 133 mmol/L — ABNORMAL LOW (ref 135–145)
Total Bilirubin: 1.4 mg/dL — ABNORMAL HIGH (ref 0.3–1.2)
Total Protein: 8 g/dL (ref 6.5–8.1)

## 2017-06-21 LAB — URINALYSIS, ROUTINE W REFLEX MICROSCOPIC
Bilirubin Urine: NEGATIVE
GLUCOSE, UA: NEGATIVE mg/dL
Ketones, ur: NEGATIVE mg/dL
Nitrite: NEGATIVE
PH: 5 (ref 5.0–8.0)
Protein, ur: NEGATIVE mg/dL
SPECIFIC GRAVITY, URINE: 1.015 (ref 1.005–1.030)

## 2017-06-21 LAB — LACTIC ACID, PLASMA: Lactic Acid, Venous: 1.1 mmol/L (ref 0.5–1.9)

## 2017-06-21 LAB — I-STAT CG4 LACTIC ACID, ED: LACTIC ACID, VENOUS: 0.66 mmol/L (ref 0.5–1.9)

## 2017-06-21 MED ORDER — HEPARIN SOD (PORK) LOCK FLUSH 100 UNIT/ML IV SOLN
500.0000 [IU] | Freq: Once | INTRAVENOUS | Status: AC
  Administered 2017-06-21: 500 [IU]

## 2017-06-21 MED ORDER — PIPERACILLIN-TAZOBACTAM 3.375 G IVPB 30 MIN
3.3750 g | Freq: Once | INTRAVENOUS | Status: AC
  Administered 2017-06-21: 3.375 g via INTRAVENOUS

## 2017-06-21 MED ORDER — VANCOMYCIN HCL IN DEXTROSE 1-5 GM/200ML-% IV SOLN
1000.0000 mg | Freq: Once | INTRAVENOUS | Status: AC
  Administered 2017-06-21: 1000 mg via INTRAVENOUS

## 2017-06-21 MED ORDER — SODIUM CHLORIDE 0.9 % IV BOLUS (SEPSIS)
2000.0000 mL | Freq: Once | INTRAVENOUS | Status: AC
  Administered 2017-06-21: 2000 mL via INTRAVENOUS

## 2017-06-21 MED ORDER — ACETAMINOPHEN 500 MG PO TABS
1000.0000 mg | ORAL_TABLET | Freq: Once | ORAL | Status: AC
  Administered 2017-06-21: 1000 mg via ORAL

## 2017-06-21 NOTE — Discharge Instructions (Signed)
You were seen in the emergency department for a fever, but no concerning cause of your fever was found.  We advised ibuprofen for any persistent fever; however, if your fever persists over 101 F for the next 12 hours or if your fever continues to rise as the day progresses, promptly return to the emergency department for evaluation.  We also recommend return if you begin to feel increased fatigue, development of nausea and/or vomiting with inability to tolerate food or fluids by mouth, syncope, worsening abdominal pain, yellowing of your eyes or skin, or any other concerning symptoms. You have blood cultures pending and will be notified if these are positive, requiring your return to the hospital.  Call Dr. Benay Spice on Monday morning for follow up during the day on Monday. Continue your daily prescribed medications. Avoid tylenol/acetaminophen.

## 2017-06-21 NOTE — ED Provider Notes (Signed)
[facility] DEPT Provider Note   CSN: [id number] Arrival date & time: 06/21/17  0141     History   Chief Complaint No chief complaint on file.   HPI Crystal Carlson is a 51 y.o. female.  51 year old female with a history of pancreatic cancer, diagnosed 1 month ago, presents to the emergency department for evaluation of fever.  Has been reports temperature of 101.5 F at 1 AM.  Patient did not take any antipyretics prior to arrival.  She had her first chemo infusion on 06/18/2017.  This was followed by a 5FU pump which was removed yesterday and patient was given a Neulasta shot at 1340.  Patient began to develop chills a few hours later.  Husband returned home at 1 AM and took the patient's temperature and noted her to be febrile.  Family called the on-call oncologist who recommended ED evaluation.  Patient denies any current complaints.  Specifically, she denies cough, chest pain, shortness of breath, abdominal pain, nausea, vomiting.  She does have constipation which is fairly chronic for her, likely secondary to her daily use of MS Contin and Dilaudid.  She did have a small bowel movement earlier today.  Oncology - Dr. Benay Spice      Past Medical History:  Diagnosis Date  . Cancer of pancreas ([place]) 05/26/2017  . Chronic pancreatitis ([place]) 05/19/2017  . Microcytic anemia 05/19/2017  . Prediabetes 2016   when living in [place]    Patient Active Problem List   Diagnosis Date Noted  . Goals of care, counseling/discussion 05/29/2017  . Pancreatic cancer metastasized to liver ([place]) 05/26/2017  . Abdominal pain 05/25/2017  . Intractable abdominal pain 05/25/2017  . HTN (hypertension) 05/25/2017  . Ovarian cyst 05/25/2017  . Elevated LFTs 05/25/2017  . Pancreatic mass 05/25/2017  . Liver mass 05/25/2017  . Common bile duct dilation 05/25/2017  . Hepatic metastasis ([place])   . Chronic pancreatitis ([place]) 05/19/2017  . Microcytic anemia 05/19/2017  .  Prediabetes 08/18/2014    Past Surgical History:  Procedure Laterality Date  . IR FLUORO GUIDE PORT INSERTION RIGHT  06/04/2017  . IR US GUIDE VASC ACCESS RIGHT  06/04/2017    OB History    No data available       Home Medications    Prior to Admission medications   Medication Sig Start Date End Date Taking? Authorizing Provider  gabapentin (NEURONTIN) 300 MG capsule Take 300 mg by mouth at bedtime.    Yes [provider]  HYDROmorphone (DILAUDID) 4 MG tablet Take 1-2 tablets (4-8 mg total) by mouth every 4 (four) hours as needed for severe pain. 06/18/17  Yes Ladell Pier, MD  lidocaine-prilocaine (EMLA) cream Apply to port site one hour prior to use. Do not rub in. Cover with plastic. Patient taking differently: Apply 1 application daily as needed topically. Apply to port site one hour prior to use. Do not rub in. Cover with plastic. 06/03/17  Yes Ladell Pier, MD  LORazepam (ATIVAN) 0.5 MG tablet Take 1 tablet (0.5 mg total) by mouth every 6 (six) hours as needed for anxiety (or nausea). 06/18/17  Yes Ladell Pier, MD  mirtazapine (REMERON) 7.5 MG tablet Take 1 tablet (7.5 mg total) by mouth at bedtime. 05/29/17  Yes Owens Shark, NP  morphine (MS CONTIN) 30 MG 12 hr tablet Take 1 tablet (30 mg total) by mouth every 12 (twelve) hours. 06/18/17  Yes Ladell Pier, MD  polyethylene glycol (MIRALAX / Floria Raveling)  packet Take 17 g by mouth daily. Patient taking differently: Take 17 g daily as needed by mouth for moderate constipation.  05/27/17  Yes Lavina Hamman, MD  prochlorperazine (COMPAZINE) 10 MG tablet Take 1 tablet (10 mg total) by mouth every 6 (six) hours as needed for nausea or vomiting. 05/29/17  Yes Owens Shark, NP  docusate sodium (COLACE) 100 MG capsule Take 1 capsule (100 mg total) by mouth 2 (two) times daily. Patient not taking: Reported on 06/08/2017 05/27/17   Lavina Hamman, MD  ferrous sulfate 325 (65 FE) MG EC tablet Take 1 tablet (325 mg  total) by mouth 3 (three) times daily with meals. Patient not taking: Reported on 06/21/2017 06/18/17   Ladell Pier, MD  sorbitol 70 % solution Take 30 mLs by mouth 2 (two) times daily. Patient not taking: Reported on 06/21/2017 06/08/17   Ladell Pier, MD    Family History Family History  Problem Relation Age of Onset  . Hypertension Mother   . Arthritis Mother   . Diabetes Father   . Heart disease Father        ?valvular problems  . Hyperlipidemia Son   . Diabetes Brother     Social History Social History   Tobacco Use  . Smoking status: Never Smoker  . Smokeless tobacco: Never Used  Substance Use Topics  . Alcohol use: No  . Drug use: No     Allergies   Patient has no known allergies.   Review of Systems Review of Systems Ten systems reviewed and are negative for acute change, except as noted in the HPI.    Physical Exam Updated Vital Signs BP (!) 126/92   Temp (S) 98.2 F (36.8 C) (Oral)   Resp (S) 18   LMP 06/01/2017 (Exact Date) Comment: tubes tied  SpO2 (S) 97%   Physical Exam  Constitutional: She is oriented to person, place, and time. She appears well-developed and well-nourished. No distress.  Pale, chronically ill appear; nontoxic.  HENT:  Head: Normocephalic and atraumatic.  Eyes: Conjunctivae and EOM are normal. No scleral icterus.  Neck: Normal range of motion.  Cardiovascular: Regular rhythm and intact distal pulses.  tachycardia  Pulmonary/Chest: Effort normal. No stridor. No respiratory distress. She has no wheezes. She has no rales.  Lungs CTAB.  Abdominal: Soft. She exhibits no mass. There is no tenderness. There is no guarding.  Soft, nontender abdomen. No guarding or peritoneal signs.  Musculoskeletal: Normal range of motion.  Neurological: She is alert and oriented to person, place, and time. She exhibits normal muscle tone. Coordination normal.  GCS 15. Patient moving all extremities.  Skin: Skin is warm and dry. No rash  noted. She is not diaphoretic. No erythema. No pallor.  Psychiatric: She has a normal mood and affect. Her behavior is normal.  Nursing note and vitals reviewed.    ED Treatments / Results  Labs (all labs ordered are listed, but only abnormal results are displayed) Labs Reviewed  CBC WITH DIFFERENTIAL/PLATELET - Abnormal; Notable for the following components:      Result Value   WBC 32.3 (*)    Hemoglobin 10.8 (*)    HCT 33.8 (*)    MCV 77.7 (*)    MCH 24.8 (*)    RDW 15.8 (*)    Neutro Abs 31.3 (*)    All other components within normal limits  COMPREHENSIVE METABOLIC PANEL - Abnormal; Notable for the following components:   Sodium 133 (*)  Chloride 93 (*)    Glucose, Bld 117 (*)    BUN 24 (*)    Creatinine, Ser 1.66 (*)    AST 233 (*)    ALT 128 (*)    Alkaline Phosphatase 412 (*)    Total Bilirubin 1.4 (*)    GFR calc non Af Amer 35 (*)    GFR calc Af Amer 41 (*)    All other components within normal limits  URINALYSIS, ROUTINE W REFLEX MICROSCOPIC - Abnormal; Notable for the following components:   Hgb urine dipstick MODERATE (*)    Leukocytes, UA SMALL (*)    Bacteria, UA RARE (*)    Squamous Epithelial / LPF 0-5 (*)    All other components within normal limits  CULTURE, BLOOD (ROUTINE X 2)  CULTURE, BLOOD (ROUTINE X 2)  URINE CULTURE  LACTIC ACID, PLASMA  I-STAT CG4 LACTIC ACID, ED    EKG  EKG Interpretation None       Radiology Dg Chest 2 View  Result Date: 06/21/2017 CLINICAL DATA:  Fever.  Recent diagnosis of pancreatic cancer. EXAM: CHEST  2 VIEW COMPARISON:  Chest CT 05/25/2017 FINDINGS: Tip of the right chest port in the mid SVC. The cardiomediastinal contours are normal. The lungs are clear. Pulmonary vasculature is normal. No consolidation, pleural effusion, or pneumothorax. No acute osseous abnormalities are seen. Biliary stent in the right upper quadrant is partially included. Nipple shadows noted projecting over the lower chest. IMPRESSION: No  active cardiopulmonary disease. Electronically Signed   By: Jeb Levering M.D.   On: 06/21/2017 03:43    Procedures Procedures (including critical care time)  Medications Ordered in ED Medications  piperacillin-tazobactam (ZOSYN) IVPB 3.375 g (0 g Intravenous Stopped 06/21/17 0321)  vancomycin (VANCOCIN) IVPB 1000 mg/200 mL premix (0 mg Intravenous Stopped 06/21/17 0351)  acetaminophen (TYLENOL) tablet 1,000 mg (1,000 mg Oral Given 06/21/17 0230)  sodium chloride 0.9 % bolus 2,000 mL (0 mLs Intravenous Stopped 06/21/17 0409)  heparin lock flush 100 unit/mL (500 Units Intracatheter Given 06/21/17 0552)     Initial Impression / Assessment and Plan / ED Course  I have reviewed the triage vital signs and the nursing notes.  Pertinent labs & imaging results that were available during my care of the patient were reviewed by me and considered in my medical decision making (see chart for details).    2:00 AM Patient initially assessed during downtime.  She presents for fever of 101.5 F at home.  Patient with maximum temperature of 100.3 F in the emergency department.  No antipyretics taken prior to arrival.  She has a history of pancreatic cancer with metastases to the liver.  She is currently undergoing chemotherapy infusions.  Husband reports Neulasta shot yesterday afternoon.  Patient denies any symptoms other than chills.  Will obtain laboratory workup, urine, chest x-ray.  Empiric antibiotics ordered for coverage as well as IV fluids.  4:00 AM Patient's laboratory workup without significant change from baseline, though leukocytosis is marked with left shift. Unclear etiology of leukocytosis, but may be explained by Neulasta. Unable to exclude infectious cause. CXR negative for PNA. UA without evidence of UTI. Cultures pending. Will consult Oncology to discuss plan of care.  4:30 AM Case discussed with Dr. Marin Olp of oncology.  I have communicated the patient's leukocytosis of 32.3 as well  as left shift of 31.3 absolute neutrophils with normal remaining differential.  Dr. Marin Olp aware of reassuring urine and negative chest x-ray with no other evidence of clear  infection source. Dr Marin Olp believes that the fever may be 2/2 Folfirinox and that leukocytosis is likely due to the patient's Neulasta injection yesterday. Dr. Marin Olp believes outpatient follow up to be appropriate. He does not see indication for coverage with antibiotics on an outpatient basis.  4:45 AM Patient updated on work up and plan for discharge. Expresses comfort with plan. Will discuss with spouse as well when he returns to room.  5:34 AM I had a long conversation with the patient and her spouse regarding her workup today and plan for outpatient follow-up.  I have specifically discussed the patient's LFTs which are close to tests at the end of October, but elevated from 06/18/17. I have explained the need to trend these tests with the patient's oncologist. Patient also encouraged to continually hydrate with PO fluids. Family report decreased PO intake which could account for trending creatinine in the setting of persistent dehydration. Patient given 2L IVF in the emergency department. Patient and spouse expressed comfort with plan for discharge.  Have stressed low threshold for return should fever persist or symptoms worsen.  Patient and spouse verbalized understanding. Return precautions discussed and provided. Patient discharged in stable condition with no unaddressed concerns.   Final Clinical Impressions(s) / ED Diagnoses   Final diagnoses:  Fever, unspecified fever cause  Leukocytosis, unspecified type    New Prescriptions This SmartLink is deprecated. Use AVSMEDLIST instead to display the medication list for a patient.   Antonietta Breach, PA-C 06/21/17 0093    Drenda Freeze, MD 06/21/17 [phone]

## 2017-06-21 NOTE — ED Notes (Signed)
Please see notes during downtime.

## 2017-06-22 ENCOUNTER — Encounter (HOSPITAL_COMMUNITY): Payer: Self-pay

## 2017-06-22 ENCOUNTER — Telehealth: Payer: Self-pay | Admitting: *Deleted

## 2017-06-22 LAB — URINE CULTURE: Culture: 30000 — AB

## 2017-06-22 NOTE — Telephone Encounter (Signed)
Noted pt was seen in ED over the weekend. Called husband, he reports she had back pain 9/10 today. Taking Dilaudid and Ibuprofen. She had one loose stool today, no fevers. Instructed him to call or send MyChart message with an update early 11/6. He agreed to do so. Dr. Benay Spice aware.

## 2017-06-23 ENCOUNTER — Telehealth: Payer: Self-pay | Admitting: Oncology

## 2017-06-23 ENCOUNTER — Telehealth: Payer: Self-pay | Admitting: *Deleted

## 2017-06-23 NOTE — Telephone Encounter (Signed)
Scheduled appt per 11/2 los - patient is aware  Of appt added.

## 2017-06-23 NOTE — Telephone Encounter (Signed)
06/23/2017 @ 1:20 pm faxed completed FMLA/Disability to [employer] [facility] @ [phone]. Per Lavella Lemons, Dr. Gearldine Shown nurse, patient requested a copy to be left with front desk receptionist, Ms. Wilma, and she will pick up on 06/25/2017

## 2017-06-23 NOTE — Telephone Encounter (Signed)
Post ED Visit - Positive Culture Follow-up  Culture report reviewed by antimicrobial stewardship pharmacist:  []  Elenor Quinones, Pharm.D. []  Heide Guile, Pharm.D., BCPS AQ-ID []  Parks Neptune, Pharm.D., BCPS []  Alycia Rossetti, Pharm.D., BCPS []  [place], [place].D., BCPS, AAHIVP []  Legrand Como, Pharm.D., BCPS, AAHIVP []  Salome Arnt, PharmD, BCPS []  Dimitri Ped, PharmD, BCPS []  Vincenza Hews, PharmD, BCPS  Positive urine culture, reviewed by Carmon Sails, PA-C Spoke with spouse of patient who states patient is feeling better, no urinary symptoms at present and no further patient follow-up is required at this time.  Harlon Flor [facility] 06/23/2017, 9:38 AM

## 2017-06-24 ENCOUNTER — Encounter: Payer: Self-pay | Admitting: Pharmacy Technician

## 2017-06-24 NOTE — Progress Notes (Signed)
The patient is approved for drug assistance enrollment by [employer] for Emend with coverage from 06/24/17 - 06/24/18. Enrollment is based on Self Pay. The first DOS drug request will be 06/18/17. The patient is also enrolled by [employer] for Neulasta.

## 2017-06-25 ENCOUNTER — Telehealth: Payer: Self-pay | Admitting: *Deleted

## 2017-06-25 ENCOUNTER — Other Ambulatory Visit (HOSPITAL_BASED_OUTPATIENT_CLINIC_OR_DEPARTMENT_OTHER): Payer: Medicaid Other

## 2017-06-25 ENCOUNTER — Telehealth: Payer: Self-pay

## 2017-06-25 DIAGNOSIS — C259 Malignant neoplasm of pancreas, unspecified: Secondary | ICD-10-CM

## 2017-06-25 DIAGNOSIS — C787 Secondary malignant neoplasm of liver and intrahepatic bile duct: Principal | ICD-10-CM

## 2017-06-25 DIAGNOSIS — C257 Malignant neoplasm of other parts of pancreas: Secondary | ICD-10-CM

## 2017-06-25 LAB — CBC WITH DIFFERENTIAL/PLATELET
BASO%: 0 % (ref 0.0–2.0)
Basophils Absolute: 0 10*3/uL (ref 0.0–0.1)
EOS%: 6.1 % (ref 0.0–7.0)
Eosinophils Absolute: 0.1 10*3/uL (ref 0.0–0.5)
HEMATOCRIT: 32.2 % — AB (ref 34.8–46.6)
HGB: 10.2 g/dL — ABNORMAL LOW (ref 11.6–15.9)
LYMPH#: 0.5 10*3/uL — AB (ref 0.9–3.3)
LYMPH%: 23.4 % (ref 14.0–49.7)
MCH: 24.3 pg — ABNORMAL LOW (ref 25.1–34.0)
MCHC: 31.7 g/dL (ref 31.5–36.0)
MCV: 76.7 fL — ABNORMAL LOW (ref 79.5–101.0)
MONO#: 0.6 10*3/uL (ref 0.1–0.9)
MONO%: 32 % — ABNORMAL HIGH (ref 0.0–14.0)
NEUT%: 38.5 % (ref 38.4–76.8)
NEUTROS ABS: 0.8 10*3/uL — AB (ref 1.5–6.5)
PLATELETS: 125 10*3/uL — AB (ref 145–400)
RBC: 4.2 10*6/uL (ref 3.70–5.45)
RDW: 15.3 % — ABNORMAL HIGH (ref 11.2–14.5)
WBC: 2 10*3/uL — AB (ref 3.9–10.3)

## 2017-06-25 LAB — COMPREHENSIVE METABOLIC PANEL
ALBUMIN: 3 g/dL — AB (ref 3.5–5.0)
ALT: 80 U/L — AB (ref 0–55)
ANION GAP: 12 meq/L — AB (ref 3–11)
AST: 83 U/L — ABNORMAL HIGH (ref 5–34)
Alkaline Phosphatase: 577 U/L — ABNORMAL HIGH (ref 40–150)
BILIRUBIN TOTAL: 0.64 mg/dL (ref 0.20–1.20)
BUN: 13.8 mg/dL (ref 7.0–26.0)
CALCIUM: 9.3 mg/dL (ref 8.4–10.4)
CO2: 25 mEq/L (ref 22–29)
CREATININE: 1.4 mg/dL — AB (ref 0.6–1.1)
Chloride: 96 mEq/L — ABNORMAL LOW (ref 98–109)
EGFR: 46 mL/min/{1.73_m2} — ABNORMAL LOW (ref >60)
Glucose: 121 mg/dl (ref 70–140)
Potassium: 4.4 mEq/L (ref 3.5–5.1)
Sodium: 132 mEq/L — ABNORMAL LOW (ref 136–145)
TOTAL PROTEIN: 7.5 g/dL (ref 6.4–8.3)

## 2017-06-25 NOTE — Telephone Encounter (Signed)
Husband called that pt has poor appetite, she is not eating or drinking much. She gets stomach upset when she eats or drinks. She bloats some after eating-visible her husband can see it. And she feels full after a few bites.  She is using her dilaudid and MS contin and compazine. This am temp was 100.2. Last night temp was 100.7. He gave her 500 mg of tylenol last night and this am. Dropped temp to 99.0. No c/o constipation, husband is unaware of diarrhea.   Her last folfirinox 11-1, neulasta 11-3. She had labs today but did not mention stomach issues Husband is asking for an appetite stimulant.   S/w Dr Benay Spice. No appetite stimulant at this time. Discussed ativan.   S/w husband discussed taking ativan or compazine 45 minutes before she eats. Discussed [facility] and asking to be seen as a walk in. Discussed if she gets shaking chills, or weak and dizzy or s/s of infection - call

## 2017-06-25 NOTE — Telephone Encounter (Signed)
Spoke with pt's husband, he will call back if she develops high fever, or shaking chills. He is concerned that she isn't eating. He reports she has pain and vomiting after eating and drinking small amounts. She is mostly staying in bed due to weakness and fatigue. She is using Lorazepam for nausea and anxiety. Uses compazine occasionally.  Reviewed above with Dr. Benay Spice: Work in for symptom management 11/9. Called husband with appt. Informed him he can cancel if she is feeling better by tomorrow.

## 2017-06-25 NOTE — Telephone Encounter (Signed)
-----   Message from Ladell Pier, MD sent at 06/25/2017  1:37 PM EST ----- Please call patient, liver tests are better, wbcs low, call for fever, f/u as scheduled

## 2017-06-26 ENCOUNTER — Ambulatory Visit (HOSPITAL_BASED_OUTPATIENT_CLINIC_OR_DEPARTMENT_OTHER): Payer: Medicaid Other | Admitting: Medical

## 2017-06-26 VITALS — BP 123/92 | HR 113 | Temp 100.0°F | Resp 18 | Ht 64.0 in | Wt 127.7 lb

## 2017-06-26 DIAGNOSIS — G893 Neoplasm related pain (acute) (chronic): Secondary | ICD-10-CM | POA: Diagnosis not present

## 2017-06-26 DIAGNOSIS — R509 Fever, unspecified: Secondary | ICD-10-CM

## 2017-06-26 DIAGNOSIS — R112 Nausea with vomiting, unspecified: Secondary | ICD-10-CM | POA: Diagnosis not present

## 2017-06-26 DIAGNOSIS — C259 Malignant neoplasm of pancreas, unspecified: Secondary | ICD-10-CM

## 2017-06-26 DIAGNOSIS — C257 Malignant neoplasm of other parts of pancreas: Secondary | ICD-10-CM | POA: Diagnosis present

## 2017-06-26 DIAGNOSIS — C787 Secondary malignant neoplasm of liver and intrahepatic bile duct: Secondary | ICD-10-CM | POA: Diagnosis not present

## 2017-06-26 DIAGNOSIS — K8689 Other specified diseases of pancreas: Secondary | ICD-10-CM

## 2017-06-26 LAB — CULTURE, BLOOD (ROUTINE X 2)
Culture: NO GROWTH
Culture: NO GROWTH
SPECIAL REQUESTS: ADEQUATE
Special Requests: ADEQUATE

## 2017-06-26 LAB — MANUAL DIFFERENTIAL
ALC: 1 10*3/uL (ref 0.9–3.3)
ANC (CHCC manual diff): 2.1 10*3/uL (ref 1.5–6.5)
BASOPHIL: 0 % (ref 0–2)
BLASTS: 0 % (ref 0–0)
Band Neutrophils: 0 % (ref 0–10)
EOS%: 5 % (ref 0–7)
LYMPH: 23 % (ref 14–49)
MONO: 26 % — ABNORMAL HIGH (ref 0–14)
Metamyelocytes: 1 % — ABNORMAL HIGH (ref 0–0)
Myelocytes: 2 % — ABNORMAL HIGH (ref 0–0)
NRBC: 0 % (ref 0–0)
OTHER CELL: 0 % (ref 0–0)
PLT EST: DECREASED
PROMYELO: 0 % (ref 0–0)
SEG: 43 % (ref 38–77)
VARIANT LYMPH: 0 % (ref 0–0)

## 2017-06-26 LAB — COMPREHENSIVE METABOLIC PANEL
ALT: 62 U/L — ABNORMAL HIGH (ref 0–55)
ANION GAP: 11 meq/L (ref 3–11)
AST: 51 U/L — ABNORMAL HIGH (ref 5–34)
Albumin: 2.8 g/dL — ABNORMAL LOW (ref 3.5–5.0)
Alkaline Phosphatase: 453 U/L — ABNORMAL HIGH (ref 40–150)
BUN: 16.7 mg/dL (ref 7.0–26.0)
CALCIUM: 8.8 mg/dL (ref 8.4–10.4)
CHLORIDE: 95 meq/L — AB (ref 98–109)
CO2: 24 meq/L (ref 22–29)
Creatinine: 1.5 mg/dL — ABNORMAL HIGH (ref 0.6–1.1)
EGFR: 42 mL/min/{1.73_m2} — ABNORMAL LOW (ref >60)
Glucose: 123 mg/dl (ref 70–140)
Potassium: 4.6 mEq/L (ref 3.5–5.1)
Sodium: 129 mEq/L — ABNORMAL LOW (ref 136–145)
Total Bilirubin: 0.62 mg/dL (ref 0.20–1.20)
Total Protein: 7 g/dL (ref 6.4–8.3)

## 2017-06-26 LAB — CBC WITH DIFFERENTIAL/PLATELET
HCT: 31.6 % — ABNORMAL LOW (ref 34.8–46.6)
HEMOGLOBIN: 10.3 g/dL — AB (ref 11.6–15.9)
MCH: 24.5 pg — AB (ref 25.1–34.0)
MCHC: 32.6 g/dL (ref 31.5–36.0)
MCV: 75.1 fL — AB (ref 79.5–101.0)
PLATELETS: 129 10*3/uL — AB (ref 145–400)
RBC: 4.21 10*6/uL (ref 3.70–5.45)
RDW: 15.4 % — ABNORMAL HIGH (ref 11.2–14.5)
WBC: 4.5 10*3/uL (ref 3.9–10.3)

## 2017-06-26 MED ORDER — DEXAMETHASONE SODIUM PHOSPHATE 10 MG/ML IJ SOLN
INTRAMUSCULAR | Status: AC
  Filled 2017-06-26: qty 1

## 2017-06-26 MED ORDER — DEXAMETHASONE SODIUM PHOSPHATE 10 MG/ML IJ SOLN
10.0000 mg | Freq: Once | INTRAMUSCULAR | Status: AC
  Administered 2017-06-26: 10 mg via INTRAVENOUS

## 2017-06-26 MED ORDER — SODIUM CHLORIDE 0.9% FLUSH
10.0000 mL | Freq: Once | INTRAVENOUS | Status: AC
  Administered 2017-06-26: 10 mL via INTRAVENOUS
  Filled 2017-06-26: qty 10

## 2017-06-26 MED ORDER — HYDROMORPHONE HCL 2 MG/ML IJ SOLN
2.0000 mg | Freq: Once | INTRAMUSCULAR | Status: AC
  Administered 2017-06-26: 2 mg via INTRAVENOUS

## 2017-06-26 MED ORDER — HEPARIN SOD (PORK) LOCK FLUSH 100 UNIT/ML IV SOLN
500.0000 [IU] | Freq: Once | INTRAVENOUS | Status: AC
  Administered 2017-06-26: 500 [IU] via INTRAVENOUS
  Filled 2017-06-26: qty 5

## 2017-06-26 MED ORDER — SODIUM CHLORIDE 0.9 % IV SOLN: 10.0000 mg | Freq: Once | INTRAVENOUS | Status: DC

## 2017-06-26 MED ORDER — SODIUM CHLORIDE 0.9 % IV SOLN
Freq: Once | INTRAVENOUS | Status: AC
  Administered 2017-06-26: 11:00:00 via INTRAVENOUS

## 2017-06-26 MED ORDER — MORPHINE SULFATE ER 30 MG PO TBCR: EXTENDED_RELEASE_TABLET | ORAL | 0 refills | Status: DC

## 2017-06-26 MED ORDER — HYDROMORPHONE HCL 4 MG PO TABS: 4.0000 mg | ORAL_TABLET | ORAL | 0 refills | Status: DC | PRN

## 2017-06-26 MED ORDER — HYDROMORPHONE HCL 2 MG/ML IJ SOLN
INTRAMUSCULAR | Status: AC
  Filled 2017-06-26: qty 1

## 2017-06-26 MED FILL — MORPHINE SULF ER 30 MG TAB: 30 | 30 days supply | Qty: 90 | Fill #0

## 2017-06-26 MED FILL — HYDROmorphone HCL 4 MG TABS: 4 | 5 days supply | Qty: 60 | Fill #0

## 2017-06-26 NOTE — Patient Instructions (Signed)
Dehydration, Adult Dehydration is a condition in which there is not enough fluid or water in the body. This happens when you lose more fluids than you take in. Important organs, such as the kidneys, brain, and heart, cannot function without a proper amount of fluids. Any loss of fluids from the body can lead to dehydration. Dehydration can range from mild to severe. This condition should be treated right away to prevent it from becoming severe. What are the causes? This condition may be caused by:  Vomiting.  Diarrhea.  Excessive sweating, such as from heat exposure or exercise.  Not drinking enough fluid, especially: ? When ill. ? While doing activity that requires a lot of energy.  Excessive urination.  Fever.  Infection.  Certain medicines, such as medicines that cause the body to lose excess fluid (diuretics).  Inability to access safe drinking water.  Reduced physical ability to get adequate water and food.  What increases the risk? This condition is more likely to develop in people:  Who have a poorly controlled long-term (chronic) illness, such as diabetes, heart disease, or kidney disease.  Who are age 65 or older.  Who are disabled.  Who live in a place with high altitude.  Who play endurance sports.  What are the signs or symptoms? Symptoms of mild dehydration may include:  Thirst.  Dry lips.  Slightly dry mouth.  Dry, warm skin.  Dizziness. Symptoms of moderate dehydration may include:  Very dry mouth.  Muscle cramps.  Dark urine. Urine may be the color of tea.  Decreased urine production.  Decreased tear production.  Heartbeat that is irregular or faster than normal (palpitations).  Headache.  Light-headedness, especially when you stand up from a sitting position.  Fainting (syncope). Symptoms of severe dehydration may include:  Changes in skin, such as: ? Cold and clammy skin. ? Blotchy (mottled) or pale skin. ? Skin that does  not quickly return to normal after being lightly pinched and released (poor skin turgor).  Changes in body fluids, such as: ? Extreme thirst. ? No tear production. ? Inability to sweat when body temperature is high, such as in hot weather. ? Very little urine production.  Changes in vital signs, such as: ? Weak pulse. ? Pulse that is more than 100 beats a minute when sitting still. ? Rapid breathing. ? Low blood pressure.  Other changes, such as: ? Sunken eyes. ? Cold hands and feet. ? Confusion. ? Lack of energy (lethargy). ? Difficulty waking up from sleep. ? Short-term weight loss. ? Unconsciousness. How is this diagnosed? This condition is diagnosed based on your symptoms and a physical exam. Blood and urine tests may be done to help confirm the diagnosis. How is this treated? Treatment for this condition depends on the severity. Mild or moderate dehydration can often be treated at home. Treatment should be started right away. Do not wait until dehydration becomes severe. Severe dehydration is an emergency and it needs to be treated in a hospital. Treatment for mild dehydration may include:  Drinking more fluids.  Replacing salts and minerals in your blood (electrolytes) that you may have lost. Treatment for moderate dehydration may include:  Drinking an oral rehydration solution (ORS). This is a drink that helps you replace fluids and electrolytes (rehydrate). It can be found at pharmacies and retail stores. Treatment for severe dehydration may include:  Receiving fluids through an IV tube.  Receiving an electrolyte solution through a feeding tube that is passed through your nose   and into your stomach (nasogastric tube, or NG tube).  Correcting any abnormalities in electrolytes.  Treating the underlying cause of dehydration. Follow these instructions at home:  If directed by your health care provider, drink an ORS: ? Make an ORS by following instructions on the  package. ? Start by drinking small amounts, about  cup (120 mL) every 5-10 minutes. ? Slowly increase how much you drink until you have taken the amount recommended by your health care provider.  Drink enough clear fluid to keep your urine clear or pale yellow. If you were told to drink an ORS, finish the ORS first, then start slowly drinking other clear fluids. Drink fluids such as: ? Water. Do not drink only water. Doing that can lead to having too little salt (sodium) in the body (hyponatremia). ? Ice chips. ? Fruit juice that you have added water to (diluted fruit juice). ? Low-calorie sports drinks.  Avoid: ? Alcohol. ? Drinks that contain a lot of sugar. These include high-calorie sports drinks, fruit juice that is not diluted, and soda. ? Caffeine. ? Foods that are greasy or contain a lot of fat or sugar.  Take over-the-counter and prescription medicines only as told by your health care provider.  Do not take sodium tablets. This can lead to having too much sodium in the body (hypernatremia).  Eat foods that contain a healthy balance of electrolytes, such as bananas, oranges, potatoes, tomatoes, and spinach.  Keep all follow-up visits as told by your health care provider. This is important. Contact a health care provider if:  You have abdominal pain that: ? Gets worse. ? Stays in one area (localizes).  You have a rash.  You have a stiff neck.  You are more irritable than usual.  You are sleepier or more difficult to wake up than usual.  You feel weak or dizzy.  You feel very thirsty.  You have urinated only a small amount of very dark urine over 6-8 hours. Get help right away if:  You have symptoms of severe dehydration.  You cannot drink fluids without vomiting.  Your symptoms get worse with treatment.  You have a fever.  You have a severe headache.  You have vomiting or diarrhea that: ? Gets worse. ? Does not go away.  You have blood or green matter  (bile) in your vomit.  You have blood in your stool. This may cause stool to look black and tarry.  You have not urinated in 6-8 hours.  You faint.  Your heart rate while sitting still is over 100 beats a minute.  You have trouble breathing. This information is not intended to replace advice given to you by your health care provider. Make sure you discuss any questions you have with your health care provider. Document Released: 08/04/2005 Document Revised: 02/29/2016 Document Reviewed: 09/28/2015 [employer] Interactive Patient Education  2018 [employer]  

## 2017-06-26 NOTE — Progress Notes (Signed)
[facility] Progress Note   Crystal Carlson [id number] [birth year] 51 y.o.  Crystal Carlson is managed by Dr. 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 - Plan: HYDROmorphone (DILAUDID) injection 2 mg, 0.9 %  sodium chloride infusion, CBC with Differential, Comprehensive metabolic panel, HYDROmorphone (DILAUDID) 4 MG tablet, morphine (MS CONTIN) 30 MG 12 hr tablet, sodium chloride flush (NS) 0.9 % injection 10 mL, heparin lock flush 100 unit/mL, CANCELED: CBC with Differential, CANCELED: Comprehensive metabolic panel  Fever, unspecified fever cause - Plan: Culture, Blood, Culture, Blood, CBC with Differential, Comprehensive metabolic panel, sodium chloride flush (NS) 0.9 % injection 10 mL, heparin lock flush 100 unit/mL, CANCELED: CBC with Differential, CANCELED: Comprehensive metabolic panel  Non-intractable vomiting with nausea, unspecified vomiting type - Plan: sodium chloride flush (NS) 0.9 % injection 10 mL, heparin lock flush 100 unit/mL, DISCONTINUED: dexamethasone (DECADRON) 10 mg in sodium chloride 0.9 % 50 mL IVPB  Pancreatic mass - Plan: sodium chloride flush (NS) 0.9 % injection 10 mL, heparin lock flush 100 unit/mL  Hepatic metastasis (HCC) - Plan: sodium chloride flush (NS) 0.9 % injection 10 mL, heparin lock flush 100 unit/mL  Pancreatic cancer metastasized to liver (HCC) - Plan: dexamethasone (DECADRON) injection 10 mg, sodium chloride flush (NS) 0.9 % injection 10 mL, heparin lock flush 100 unit/mL   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  She was given a refill on her Dilaudid 4 mg, 1-2 p.o. every 4 hours as needed for pain.  She was given a prescription for MS Contin 30 mg p.o. every morning and 60 mg p.o. Nightly.  Fever: Blood cultures x2 and a CBC were collected  today.  Non-intractable vomiting with nausea: Patient was given 1 L of IV normal saline today and Decadron 10 mg IV.  Pancreatic cancer with metastatic disease to the liver: Crystal Carlson is scheduled to return on 07/02/2017 for consideration of cycle 2 of FOLFIRINOX.  Please see After Visit Summary for patient specific instructions.  Future Appointments  Date Time Provider [facility]  07/02/2017  9:15 AM [facility] ONLY [facility] None  07/02/2017  9:30 AM [facility] G22 [facility] None  07/02/2017 10:00 AM Curcio, Roselie Awkward, NP [facility] None  07/02/2017 11:15 AM [facility] J32 DNS [facility] None    Orders Placed This Encounter  Procedures  . Culture, Blood  . Culture, Blood  . CBC with Differential  . Comprehensive metabolic panel       Subjective:   Patient ID:  Crystal Carlson is a 51 y.o. (DOB Jan 09, 1966) female.  Chief Complaint:  Chief Complaint  Patient presents with  . 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  She is status post cycle 1 of FOLFIRINOX dosed on 06/18/2017.  She has a history of elevated liver enzymes and is status post an ERCP for placement of a common bile duct stent on 06/09/2017.  She was most recently seen in the ER on 06/21/2017 when she had a fever of 101.5.  Our office was contacted yesterday with the patient's husband stating that the patient had poor p.o. intake of solids and liquids.  She was having nausea and vomiting yesterday along with diarrhea.  She has been taking Compazine every 6 hours  but continues to have nausea.  She is also having abdominal pain despite her use of Dilaudid 3 times daily and MS Contin 30 mg p.o. every 12 hours.  She had a fever of 100.7 this morning.  She has been taking Tylenol episodically for her fever.  Medications: I have reviewed the patient's current medications.  Allergies: No Known Allergies  Past  Medical History:  Diagnosis Date  . Cancer of pancreas ([place]) 05/26/2017  . Chronic pancreatitis ([place]) 05/19/2017  . Microcytic anemia 05/19/2017  . Prediabetes 2016   when living in [place]    Past Surgical History:  Procedure Laterality Date  . IR FLUORO GUIDE PORT INSERTION RIGHT  06/04/2017  . IR US GUIDE VASC ACCESS RIGHT  06/04/2017    Family History  Problem Relation Age of Onset  . Hypertension Mother   . Arthritis Mother   . Diabetes Father   . Heart disease Father        ?valvular problems  . Hyperlipidemia Son   . Diabetes Brother     Social History   Socioeconomic History  . Marital status: Married    Spouse name: Not on file  . Number of children: 3  . Years of education: college grad--civics--states equivalent to High school here.  . Highest education level: Not on file  Social Needs  . Financial resource strain: Not on file  . Food insecurity - worry: Not on file  . Food insecurity - inability: Not on file  . Transportation needs - medical: Not on file  . Transportation needs - non-medical: Not on file  Occupational History  . 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 Smoking status: Never Smoker  . Smokeless tobacco: Never Used  Substance and Sexual Activity  . Alcohol use: No  . Drug use: No  . Sexual activity: Not on file  Other Topics Concern  . Not on file  Social History Narrative   Originally from [place]   Moved to [place] In 61   Husband came in 2000   Lives at home with husband, 3 children and mother and father in Sports [employer].    Past Medical History, Surgical history, Social history, and Family history were reviewed and updated as appropriate.   Please see review of systems for further details on the patient's review from today.   Review of Systems:  Review of Systems  Constitutional: Positive for activity change, appetite change, fatigue and fever. Negative for chills and diaphoresis.  Respiratory:  Negative for cough and shortness of breath.   Cardiovascular: Positive for palpitations. Negative for chest pain.  Gastrointestinal: Positive for abdominal pain, diarrhea, nausea and vomiting. Negative for constipation.  Genitourinary: Negative for difficulty urinating and dysuria.    Objective:   Physical Exam:  BP (!) 123/92 (BP Location: Right Arm, Patient Position: Sitting)   Pulse (!) 113   Temp 100 F (37.8 C) (Oral)   Resp 18   Ht 5\' 4"  (1.626 m)   Wt 127 lb 11.2 oz (57.9 kg)   LMP 06/01/2017 (Exact Date) Comment: tubes tied  SpO2 99%   BMI 21.92 kg/m  ECOG: 2   Physical Exam  Constitutional: No distress.  The patient is an adult female who appears to be chronically ill and older than her stated age.  HENT:  Head: Normocephalic and atraumatic.  Mouth/Throat: No oropharyngeal exudate.  Eyes: Right eye exhibits no discharge. Left eye exhibits no discharge. No scleral icterus.  Neck: Normal range  of motion. Neck supple.  Cardiovascular: S1 normal and S2 normal. Tachycardia present.  Pulmonary/Chest: Effort normal and breath sounds normal. No respiratory distress. She has no wheezes. She has no rales.  Abdominal: Soft. Bowel sounds are normal. She exhibits no distension. There is tenderness (Left lower quadrant). There is no rebound and no guarding.  Musculoskeletal: She exhibits no edema.  Lymphadenopathy:    She has no cervical adenopathy.  Skin: Skin is warm and dry. She is not diaphoretic.  Port-A-Cath site without erythema  Lab Review:     Component Value Date/Time   NA 129 (L) 06/26/2017 1126   K 4.6 06/26/2017 1126   CL 93 (L) 06/21/2017 0220   CO2 24 06/26/2017 1126   GLUCOSE 123 06/26/2017 1126   BUN 16.7 06/26/2017 1126   CREATININE 1.5 (H) 06/26/2017 1126   CALCIUM 8.8 06/26/2017 1126   PROT 7.0 06/26/2017 1126   ALBUMIN 2.8 (L) 06/26/2017 1126   AST 51 (H) 06/26/2017 1126   ALT 62 (H) 06/26/2017 1126   ALKPHOS 453 (H) 06/26/2017 1126   BILITOT  0.62 06/26/2017 1126   GFRNONAA 35 (L) 06/21/2017 0220   GFRAA 41 (L) 06/21/2017 0220       Component Value Date/Time   WBC 4.5 06/26/2017 1126   WBC 32.3 (H) 06/21/2017 0220   RBC 4.21 06/26/2017 1126   RBC 4.35 06/21/2017 0220   HGB 10.3 (L) 06/26/2017 1126   HCT 31.6 (L) 06/26/2017 1126   PLT 129 (L) 06/26/2017 1126   PLT 117 (L) 05/08/2017 1557   MCV 75.1 (L) 06/26/2017 1126   MCH 24.5 (L) 06/26/2017 1126   MCH 24.8 (L) 06/21/2017 0220   MCHC 32.6 06/26/2017 1126   MCHC 32.0 06/21/2017 0220   RDW 15.4 (H) 06/26/2017 1126   LYMPHSABS 0.5 (L) 06/25/2017 1146   MONOABS 0.6 06/25/2017 1146   EOSABS 0.1 06/25/2017 1146   EOSABS 0.2 05/08/2017 1557   BASOSABS 0.0 06/25/2017 1146   -------------------------------  Imaging from last 24 hours (if applicable):  Radiology interpretation: Dg Chest 2 View  Result Date: 06/21/2017 CLINICAL DATA:  Fever.  Recent diagnosis of pancreatic cancer. EXAM: CHEST  2 VIEW COMPARISON:  Chest CT 05/25/2017 FINDINGS: Tip of the right chest port in the mid SVC. The cardiomediastinal contours are normal. The lungs are clear. Pulmonary vasculature is normal. No consolidation, pleural effusion, or pneumothorax. No acute osseous abnormalities are seen. Biliary stent in the right upper quadrant is partially included. Nipple shadows noted projecting over the lower chest. IMPRESSION: No active cardiopulmonary disease. Electronically Signed   By: Jeb Levering M.D.   On: 06/21/2017 03:43   Ir US Guide Vasc Access Right  Result Date: 06/04/2017 INDICATION: 51 year old female with pancreatic adenocarcinoma. She requires durable venous access for chemotherapy. EXAM: IMPLANTED PORT A CATH PLACEMENT WITH ULTRASOUND AND FLUOROSCOPIC GUIDANCE MEDICATIONS: 2 g Ancef; The antibiotic was administered within an appropriate time interval prior to skin puncture. ANESTHESIA/SEDATION: Versed 3 mg IV; Fentanyl 100 mcg IV; Moderate Sedation Time:  25 minutes The patient  was continuously monitored during the procedure by the interventional radiology nurse under my direct supervision. FLUOROSCOPY TIME:  0 minutes, 12 seconds (1 mGy) COMPLICATIONS: None immediate. PROCEDURE: The right neck and chest was prepped with chlorhexidine, and draped in the usual sterile fashion using maximum barrier technique (cap and mask, sterile gown, sterile gloves, large sterile sheet, hand hygiene and cutaneous antiseptic). Antibiotic prophylaxis was provided with 2g Ancef administered IV one hour prior to skin  incision. Local anesthesia was attained by infiltration with 1% lidocaine with epinephrine. Ultrasound demonstrated patency of the right internal jugular vein, and this was documented with an image. Under real-time ultrasound guidance, this vein was accessed with a 21 gauge micropuncture needle and image documentation was performed. A small dermatotomy was made at the access site with an 11 scalpel. A 0.018" wire was advanced into the SVC and the access needle exchanged for a 29F micropuncture vascular sheath. The 0.018" wire was then removed and a 0.035" wire advanced into the IVC. An appropriate location for the subcutaneous reservoir was selected below the clavicle and an incision was made through the skin and underlying soft tissues. The subcutaneous tissues were then dissected using a combination of blunt and sharp surgical technique and a pocket was formed. A single lumen power injectable portacatheter was then tunneled through the subcutaneous tissues from the pocket to the dermatotomy and the port reservoir placed within the subcutaneous pocket. The venous access site was then serially dilated and a peel away vascular sheath placed over the wire. The wire was removed and the port catheter advanced into position under fluoroscopic guidance. The catheter tip is positioned in the superior cavoatrial junction. This was documented with a spot image. The portacatheter was then tested and found  to flush and aspirate well. The port was flushed with saline followed by 100 units/mL heparinized saline. The pocket was then closed in two layers using first subdermal inverted interrupted absorbable sutures followed by a running subcuticular suture. The epidermis was then sealed with Dermabond. The dermatotomy at the venous access site was also closed with a single inverted subdermal suture and the epidermis sealed with Dermabond. IMPRESSION: Successful placement of a right IJ approach Power Port with ultrasound and fluoroscopic guidance. The catheter is ready for use. Electronically Signed   By: Jacqulynn Cadet M.D.   On: 06/04/2017 14:32   Dg Ercp  Result Date: 06/09/2017 CLINICAL DATA:  ERCP with sphincterotomy and stent placement. History of pancreatic cancer. EXAM: ERCP TECHNIQUE: Multiple spot images obtained with the fluoroscopic device and submitted for interpretation post-procedure. FLUOROSCOPY TIME:  Number of Acquired Spot Images: 4 COMPARISON:  MRI 05/25/2017 FINDINGS: Opacification of the extrahepatic biliary system. Wire was advanced into the intrahepatic ducts. Narrowing and irregularity of the mid and distal common bile duct. A metallic stent was placed across the area of stenosis. IMPRESSION: Stenosis and irregularity in the common bile duct was treated with stent placement. These images were submitted for radiologic interpretation only. Please see the procedural report for the amount of contrast and the fluoroscopy time utilized. Electronically Signed   By: Markus Daft M.D.   On: 06/09/2017 12:52   Ir Fluoro Guide Port Insertion Right  Result Date: 06/04/2017 INDICATION: 51 year old female with pancreatic adenocarcinoma. She requires durable venous access for chemotherapy. EXAM: IMPLANTED PORT A CATH PLACEMENT WITH ULTRASOUND AND FLUOROSCOPIC GUIDANCE MEDICATIONS: 2 g Ancef; The antibiotic was administered within an appropriate time interval prior to skin puncture. ANESTHESIA/SEDATION:  Versed 3 mg IV; Fentanyl 100 mcg IV; Moderate Sedation Time:  25 minutes The patient was continuously monitored during the procedure by the interventional radiology nurse under my direct supervision. FLUOROSCOPY TIME:  0 minutes, 12 seconds (1 mGy) COMPLICATIONS: None immediate. PROCEDURE: The right neck and chest was prepped with chlorhexidine, and draped in the usual sterile fashion using maximum barrier technique (cap and mask, sterile gown, sterile gloves, large sterile sheet, hand hygiene and cutaneous antiseptic). Antibiotic prophylaxis was provided with 2g  Ancef administered IV one hour prior to skin incision. Local anesthesia was attained by infiltration with 1% lidocaine with epinephrine. Ultrasound demonstrated patency of the right internal jugular vein, and this was documented with an image. Under real-time ultrasound guidance, this vein was accessed with a 21 gauge micropuncture needle and image documentation was performed. A small dermatotomy was made at the access site with an 11 scalpel. A 0.018" wire was advanced into the SVC and the access needle exchanged for a 23F micropuncture vascular sheath. The 0.018" wire was then removed and a 0.035" wire advanced into the IVC. An appropriate location for the subcutaneous reservoir was selected below the clavicle and an incision was made through the skin and underlying soft tissues. The subcutaneous tissues were then dissected using a combination of blunt and sharp surgical technique and a pocket was formed. A single lumen power injectable portacatheter was then tunneled through the subcutaneous tissues from the pocket to the dermatotomy and the port reservoir placed within the subcutaneous pocket. The venous access site was then serially dilated and a peel away vascular sheath placed over the wire. The wire was removed and the port catheter advanced into position under fluoroscopic guidance. The catheter tip is positioned in the superior cavoatrial junction.  This was documented with a spot image. The portacatheter was then tested and found to flush and aspirate well. The port was flushed with saline followed by 100 units/mL heparinized saline. The pocket was then closed in two layers using first subdermal inverted interrupted absorbable sutures followed by a running subcuticular suture. The epidermis was then sealed with Dermabond. The dermatotomy at the venous access site was also closed with a single inverted subdermal suture and the epidermis sealed with Dermabond. IMPRESSION: Successful placement of a right IJ approach Power Port with ultrasound and fluoroscopic guidance. The catheter is ready for use. Electronically Signed   By: Jacqulynn Cadet M.D.   On: 06/04/2017 14:32        This patient was seen with Dr. Benay Spice with my treatment plan reviewed with him. He expressed agreement with my medical management of this patient.  This was a shared visit with Sandi Mealy.  Crystal Carlson was interviewed and examined.  She presents today with multiple complaints secondary to toxicity from chemotherapy and pancreas cancer.  She received intravenous fluids, antiemetics, and narcotic analgesics.  We adjusted the home narcotic regimen. The white count has recovered.  She will contact us for increased pain, nausea, or a high fever.  [name], MD

## 2017-06-28 ENCOUNTER — Other Ambulatory Visit: Payer: Self-pay | Admitting: Oncology

## 2017-06-29 NOTE — Progress Notes (Signed)
These preliminary result these preliminary results were noted.  Awaiting final report.

## 2017-06-30 NOTE — Progress Notes (Signed)
These preliminary result these preliminary results were noted.  Awaiting final report.

## 2017-07-01 NOTE — Progress Notes (Signed)
These preliminary result these preliminary results were noted.  Awaiting final report.

## 2017-07-02 ENCOUNTER — Ambulatory Visit (HOSPITAL_BASED_OUTPATIENT_CLINIC_OR_DEPARTMENT_OTHER): Payer: Medicaid Other

## 2017-07-02 ENCOUNTER — Ambulatory Visit: Payer: Self-pay

## 2017-07-02 ENCOUNTER — Telehealth: Payer: Self-pay

## 2017-07-02 ENCOUNTER — Other Ambulatory Visit (HOSPITAL_BASED_OUTPATIENT_CLINIC_OR_DEPARTMENT_OTHER): Payer: Medicaid Other

## 2017-07-02 ENCOUNTER — Ambulatory Visit: Payer: Self-pay | Admitting: Nurse Practitioner

## 2017-07-02 ENCOUNTER — Other Ambulatory Visit: Payer: Self-pay

## 2017-07-02 ENCOUNTER — Ambulatory Visit (HOSPITAL_BASED_OUTPATIENT_CLINIC_OR_DEPARTMENT_OTHER): Payer: Medicaid Other | Admitting: Oncology

## 2017-07-02 ENCOUNTER — Ambulatory Visit: Payer: Self-pay | Admitting: Nutrition

## 2017-07-02 ENCOUNTER — Encounter: Payer: Self-pay | Admitting: Oncology

## 2017-07-02 VITALS — BP 165/106 | HR 93 | Temp 98.4°F | Resp 18 | Ht 64.0 in | Wt 123.2 lb

## 2017-07-02 DIAGNOSIS — C257 Malignant neoplasm of other parts of pancreas: Secondary | ICD-10-CM | POA: Diagnosis not present

## 2017-07-02 DIAGNOSIS — G893 Neoplasm related pain (acute) (chronic): Secondary | ICD-10-CM | POA: Diagnosis not present

## 2017-07-02 DIAGNOSIS — D649 Anemia, unspecified: Secondary | ICD-10-CM

## 2017-07-02 DIAGNOSIS — Z5111 Encounter for antineoplastic chemotherapy: Secondary | ICD-10-CM | POA: Insufficient documentation

## 2017-07-02 DIAGNOSIS — Z95828 Presence of other vascular implants and grafts: Secondary | ICD-10-CM | POA: Insufficient documentation

## 2017-07-02 DIAGNOSIS — C787 Secondary malignant neoplasm of liver and intrahepatic bile duct: Secondary | ICD-10-CM

## 2017-07-02 DIAGNOSIS — C259 Malignant neoplasm of pancreas, unspecified: Secondary | ICD-10-CM

## 2017-07-02 LAB — CULTURE, BLOOD (SINGLE)

## 2017-07-02 LAB — CBC WITH DIFFERENTIAL/PLATELET
BASO%: 0.2 % (ref 0.0–2.0)
Basophils Absolute: 0 10*3/uL (ref 0.0–0.1)
EOS%: 1.1 % (ref 0.0–7.0)
Eosinophils Absolute: 0.2 10*3/uL (ref 0.0–0.5)
HCT: 28.4 % — ABNORMAL LOW (ref 34.8–46.6)
HGB: 9 g/dL — ABNORMAL LOW (ref 11.6–15.9)
LYMPH%: 7.3 % — AB (ref 14.0–49.7)
MCH: 24 pg — ABNORMAL LOW (ref 25.1–34.0)
MCHC: 31.7 g/dL (ref 31.5–36.0)
MCV: 75.7 fL — ABNORMAL LOW (ref 79.5–101.0)
MONO#: 0.9 10*3/uL (ref 0.1–0.9)
MONO%: 6.4 % (ref 0.0–14.0)
NEUT#: 12.4 10*3/uL — ABNORMAL HIGH (ref 1.5–6.5)
NEUT%: 85 % — ABNORMAL HIGH (ref 38.4–76.8)
PLATELETS: 177 10*3/uL (ref 145–400)
RBC: 3.75 10*6/uL (ref 3.70–5.45)
RDW: 16 % — ABNORMAL HIGH (ref 11.2–14.5)
WBC: 14.6 10*3/uL — AB (ref 3.9–10.3)
lymph#: 1.1 10*3/uL (ref 0.9–3.3)

## 2017-07-02 LAB — COMPREHENSIVE METABOLIC PANEL
ALT: 52 U/L (ref 0–55)
ANION GAP: 11 meq/L (ref 3–11)
AST: 63 U/L — AB (ref 5–34)
Albumin: 2.8 g/dL — ABNORMAL LOW (ref 3.5–5.0)
Alkaline Phosphatase: 565 U/L — ABNORMAL HIGH (ref 40–150)
BUN: 11.6 mg/dL (ref 7.0–26.0)
CHLORIDE: 99 meq/L (ref 98–109)
CO2: 25 meq/L (ref 22–29)
CREATININE: 1.2 mg/dL — AB (ref 0.6–1.1)
Calcium: 9.4 mg/dL (ref 8.4–10.4)
EGFR: 51 mL/min/{1.73_m2} — ABNORMAL LOW (ref >60)
GLUCOSE: 112 mg/dL (ref 70–140)
Potassium: 3.6 mEq/L (ref 3.5–5.1)
SODIUM: 135 meq/L — AB (ref 136–145)
Total Bilirubin: 0.45 mg/dL (ref 0.20–1.20)
Total Protein: 7 g/dL (ref 6.4–8.3)

## 2017-07-02 MED ORDER — MORPHINE SULFATE ER 15 MG PO TBCR: 15.0000 mg | EXTENDED_RELEASE_TABLET | Freq: Two times a day (BID) | ORAL | 0 refills | Status: DC

## 2017-07-02 MED ORDER — OXALIPLATIN CHEMO INJECTION 100 MG/20ML
93.0000 mg/m2 | Freq: Once | INTRAVENOUS | Status: AC
  Administered 2017-07-02: 150 mg via INTRAVENOUS
  Filled 2017-07-02: qty 30

## 2017-07-02 MED ORDER — LORAZEPAM 0.5 MG PO TABS: 0.5000 mg | ORAL_TABLET | Freq: Four times a day (QID) | ORAL | 0 refills | Status: DC | PRN

## 2017-07-02 MED ORDER — HYDROMORPHONE HCL 4 MG PO TABS: 4.0000 mg | ORAL_TABLET | ORAL | 0 refills | Status: DC | PRN

## 2017-07-02 MED ORDER — PALONOSETRON HCL INJECTION 0.25 MG/5ML
0.2500 mg | Freq: Once | INTRAVENOUS | Status: AC
  Administered 2017-07-02: 0.25 mg via INTRAVENOUS

## 2017-07-02 MED ORDER — PALONOSETRON HCL INJECTION 0.25 MG/5ML
INTRAVENOUS | Status: AC
  Filled 2017-07-02: qty 5

## 2017-07-02 MED ORDER — SODIUM CHLORIDE 0.9 % IV SOLN
2400.0000 mg/m2 | INTRAVENOUS | Status: DC
  Administered 2017-07-02: 3800 mg via INTRAVENOUS
  Filled 2017-07-02: qty 76

## 2017-07-02 MED ORDER — SODIUM CHLORIDE 0.9 % IV SOLN
Freq: Once | INTRAVENOUS | Status: AC
  Administered 2017-07-02: 13:00:00 via INTRAVENOUS
  Filled 2017-07-02: qty 5

## 2017-07-02 MED ORDER — SODIUM CHLORIDE 0.9% FLUSH
10.0000 mL | Freq: Once | INTRAVENOUS | Status: AC
  Administered 2017-07-02: 10 mL
  Filled 2017-07-02: qty 10

## 2017-07-02 MED ORDER — IRINOTECAN HCL CHEMO INJECTION 100 MG/5ML
150.0000 mg/m2 | Freq: Once | INTRAVENOUS | Status: AC
  Administered 2017-07-02: 240 mg via INTRAVENOUS
  Filled 2017-07-02: qty 12

## 2017-07-02 MED ORDER — DEXAMETHASONE 4 MG PO TABS: ORAL_TABLET | ORAL | 1 refills | Status: DC

## 2017-07-02 MED ORDER — LEUCOVORIN CALCIUM INJECTION 350 MG
400.0000 mg/m2 | Freq: Once | INTRAVENOUS | Status: AC
  Administered 2017-07-02: 636 mg via INTRAVENOUS
  Filled 2017-07-02: qty 31.8

## 2017-07-02 MED ORDER — DEXTROSE 5 % IV SOLN
Freq: Once | INTRAVENOUS | Status: AC
  Administered 2017-07-02: 12:00:00 via INTRAVENOUS

## 2017-07-02 MED ORDER — ATROPINE SULFATE 1 MG/ML IJ SOLN
INTRAMUSCULAR | Status: AC
  Filled 2017-07-02: qty 1

## 2017-07-02 MED ORDER — ATROPINE SULFATE 1 MG/ML IJ SOLN
0.5000 mg | Freq: Once | INTRAMUSCULAR | Status: AC | PRN
  Administered 2017-07-02: 0.5 mg via INTRAVENOUS

## 2017-07-02 NOTE — Assessment & Plan Note (Addendum)
1. 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 Adenocarcinoma consistent with pancreatobiliary primary.  Cycle 1 FOLFIRINOX 06/18/2018  2. Pain secondary to #1.  3. Weight losssecondary to #1.  4. Microcytic anemia  5. Mild thrombocytopenia  6. Port-A-Cath placement 06/04/2017  7. Elevated liver enzymes/bili 06/08/2017-likely early biliary obstruction secondary to pancreas cancer, status post ERCP-placement of a common bile duct stent 06/09/2018   Disposition:  Her performance status appears improved.  The elevated liver enzymes have improved.  The plan is to proceed with cycle 2 FOLFIRINOX today.  Dose will be adjusted for her decrease in weight.  The patient received 1 L of normal saline when she comes in on Saturday for her pump DC.  She was given a prescription for dexamethasone 4 mg twice a day times 3 days to start the day after her chemotherapy.  This will help with delayed nausea and vomiting and hopefully help increase her appetite.  Since she is losing weight on the Remeron, recommend that she discontinue the Remeron.  A call has been placed to Ernestene Kiel for dietitian consult today in infusion.  Refills were provided for her hydromorphone and lorazepam.  I have instructed the patient and her sister not to cut the MS Contin.  I have provided a prescription for MS Contin 15 mg twice a day which is a lower dose that she was taking.  Her pain seems overall well controlled with hydromorphone and her family does not want her to be  oversedated with a higher dose of MS Contin.  The patient will return in 1 week for repeat labs and evaluation of her symptoms.  We will follow-up in 2 weeks for evaluation prior to cycle 3 of her chemotherapy.  She will continue the current laxative regimen.  She will discontinue laxatives if she develops diarrhea.  Denies use Imodium if she develops diarrhea.  Her blood pressure is elevated today in our office.  This is consistent with her previous blood pressures in our office.  The patient reports that she checks her blood pressure at home and is not elevated.  Encouraged her to continue to check her blood pressure on a regular basis.  Plan reviewed and patient was seen with Dr. Benay Spice.

## 2017-07-02 NOTE — Patient Instructions (Signed)
[facility] Discharge Instructions for Patients Receiving Chemotherapy  Today you received the following chemotherapy agents Oxaliplatin, Irinotecan, Leucovorin, 5FU.   To help prevent nausea and vomiting after your treatment, we encourage you to take your nausea medication as prescribed.   If you develop nausea and vomiting that is not controlled by your nausea medication, call the clinic.   BELOW ARE SYMPTOMS THAT SHOULD BE REPORTED IMMEDIATELY:  *FEVER GREATER THAN 100.5 F  *CHILLS WITH OR WITHOUT FEVER  NAUSEA AND VOMITING THAT IS NOT CONTROLLED WITH YOUR NAUSEA MEDICATION  *UNUSUAL SHORTNESS OF BREATH  *UNUSUAL BRUISING OR BLEEDING  TENDERNESS IN MOUTH AND THROAT WITH OR WITHOUT PRESENCE OF ULCERS  *URINARY PROBLEMS  *BOWEL PROBLEMS  UNUSUAL RASH Items with * indicate a potential emergency and should be followed up as soon as possible.  Feel free to call the clinic should you have any questions or concerns. The clinic phone number is [phone].  Please show the [facility] at check-in to the Emergency Department and triage nurse.

## 2017-07-02 NOTE — Progress Notes (Signed)
These preliminary result these preliminary results were noted.  Awaiting final report.

## 2017-07-02 NOTE — Progress Notes (Signed)
[facility] OFFICE PROGRESS NOTE  Mack Hook, MD [address] [place] [address]  DIAGNOSIS: Pancreatic cancer  CURRENT THERAPY: FOLFIRINOX every 2 weeks.  Status post 1 cycle.  INTERVAL HISTORY: Crystal Carlson 51 y.o. female returns for routine follow-up visit accompanied by her sister.  The patient had some difficulty following her first cycle of chemotherapy with fever, decreased appetite, weight loss, diarrhea, and dehydration.  The patient is still not eating or drinking very well, but she reports that she is feeling better.  She has not had any recurrent fevers or chills.  Denies chest pain, shortness of breath, cough, hemoptysis.  Denies nausea and vomiting.  Her diarrhea has resolved and she now reports that she has some constipation.  Pain is well controlled with hydromorphone as needed and she uses MS Contin.  The patient's sister reports that she cut the MS Contin have to give her a lower dose.  The patient is here for evaluation prior to cycle 2 of her chemotherapy.  MEDICAL HISTORY: Past Medical History:  Diagnosis Date  . Cancer of pancreas ([place]) 05/26/2017  . Chronic pancreatitis ([place]) 05/19/2017  . Microcytic anemia 05/19/2017  . Prediabetes 2016   when living in [place]:  has No Known Allergies.  MEDICATIONS:  Current Outpatient Medications  Medication Sig Dispense Refill  . dexamethasone (DECADRON) 4 MG tablet Take 1 tablet twice a day for 3 days after each cycle of chemo. 30 tablet 1  . docusate sodium (COLACE) 100 MG capsule Take 1 capsule (100 mg total) by mouth 2 (two) times daily. (Patient not taking: Reported on 06/08/2017) 10 capsule 0  . ferrous sulfate 325 (65 FE) MG EC tablet Take 1 tablet (325 mg total) by mouth 3 (three) times daily with meals. 90 tablet 3  . gabapentin (NEURONTIN) 300 MG capsule Take 300 mg by mouth at bedtime.     Marland Kitchen HYDROmorphone (DILAUDID) 4 MG tablet Take 1-2 tablets (4-8 mg total) every 4 (four)  hours as needed by mouth for severe pain. 60 tablet 0  . lidocaine-prilocaine (EMLA) cream Apply to port site one hour prior to use. Do not rub in. Cover with plastic. (Patient taking differently: Apply 1 application daily as needed topically. Apply to port site one hour prior to use. Do not rub in. Cover with plastic.) 30 g 1  . LORazepam (ATIVAN) 0.5 MG tablet Take 1 tablet (0.5 mg total) every 6 (six) hours as needed by mouth for anxiety (or nausea). 30 tablet 0  . morphine (MS CONTIN) 15 MG 12 hr tablet Take 1 tablet (15 mg total) every 12 (twelve) hours by mouth. 60 tablet 0  . polyethylene glycol (MIRALAX / GLYCOLAX) packet Take 17 g by mouth daily. (Patient not taking: Reported on 06/26/2017) 14 each 0  . prochlorperazine (COMPAZINE) 10 MG tablet Take 1 tablet (10 mg total) by mouth every 6 (six) hours as needed for nausea or vomiting. 30 tablet 2  . sorbitol 70 % solution Take 30 mLs by mouth 2 (two) times daily. (Patient not taking: Reported on 06/21/2017) 473 mL 0   No current facility-administered medications for this visit.    Facility-Administered Medications Ordered in Other Visits  Medication Dose Route Frequency Provider Last Rate Last Dose  . atropine injection 0.5 mg  0.5 mg Intravenous Once PRN Ladell Pier, MD      . fluorouracil (ADRUCIL) 3,800 mg in sodium chloride 0.9 % 74 mL chemo infusion  2,400  mg/m2 (Treatment Plan Recorded) Intravenous 1 day or 1 dose Ladell Pier, MD      . irinotecan (CAMPTOSAR) 240 mg in dextrose 5 % 500 mL chemo infusion  150 mg/m2 (Treatment Plan Recorded) Intravenous Once Ladell Pier, MD      . leucovorin 636 mg in dextrose 5 % 250 mL infusion  400 mg/m2 (Treatment Plan Recorded) Intravenous Once Ladell Pier, MD      . oxaliplatin (ELOXATIN) 150 mg in dextrose 5 % 500 mL chemo infusion  93 mg/m2 (Treatment Plan Recorded) Intravenous Once Ladell Pier, MD 265 mL/hr at 07/02/17 1325 150 mg at 07/02/17 1325    SURGICAL HISTORY:   Past Surgical History:  Procedure Laterality Date  . ERCP N/A 06/09/2017   Procedure: ENDOSCOPIC RETROGRADE CHOLANGIOPANCREATOGRAPHY (ERCP);  Surgeon: Clarene Essex, MD;  Location: [facility];  Service: Endoscopy;  Laterality: N/A;  . IR FLUORO GUIDE PORT INSERTION RIGHT  06/04/2017  . IR US GUIDE VASC ACCESS RIGHT  06/04/2017    REVIEW OF SYSTEMS:   Review of Systems  Constitutional: Negative for fever and chills.  Positive for fatigue, decreased appetite, and weight loss.  HENT:   Negative for mouth sores, nosebleeds, sore throat and trouble swallowing.   Eyes: Negative for eye problems and icterus.  Respiratory: Negative for cough, hemoptysis, shortness of breath and wheezing.   Cardiovascular: Negative for chest pain and leg swelling.  Gastrointestinal: Negative for diarrhea, nausea and vomiting. Positive for constipation. Genitourinary: Negative for bladder incontinence, difficulty urinating, dysuria, frequency and hematuria.   Musculoskeletal: Negative for back pain, gait problem, neck pain and neck stiffness.  Skin: Negative for itching and rash.  Neurological: Negative for dizziness, extremity weakness, gait problem, headaches, light-headedness and seizures.  Hematological: Negative for adenopathy. Does not bruise/bleed easily.  Psychiatric/Behavioral: Negative for confusion, depression and sleep disturbance. The patient is not nervous/anxious.     PHYSICAL EXAMINATION:  Blood pressure (!) 165/106, pulse 93, temperature 98.4 F (36.9 C), temperature source Oral, resp. rate 18, height 5\' 4"  (1.626 m), weight 123 lb 3.2 oz (55.9 kg), SpO2 100 %.  ECOG PERFORMANCE STATUS: 1 - Symptomatic but completely ambulatory  Physical Exam  Constitutional: Oriented to person, place, and time and well-developed, well-nourished, and in no distress. No distress.  HENT:  Head: Normocephalic and atraumatic.  Mouth/Throat: Oropharynx is clear and moist. No oropharyngeal exudate.  Eyes:  Conjunctivae are normal. Right eye exhibits no discharge. Left eye exhibits no discharge. No scleral icterus.  Neck: Normal range of motion. Neck supple.  Cardiovascular: Normal rate, regular rhythm, normal heart sounds and intact distal pulses.   Pulmonary/Chest: Effort normal and breath sounds normal. No respiratory distress. No wheezes. No rales.  Abdominal: Soft. Bowel sounds are normal. Exhibits no distension and no mass. There is no tenderness.  Musculoskeletal: Normal range of motion. Exhibits no edema.  Lymphadenopathy:    No cervical adenopathy.  Neurological: Alert and oriented to person, place, and time. Exhibits normal muscle tone. Gait normal. Coordination normal.  Skin: Skin is warm and dry. No rash noted. Not diaphoretic. No erythema. No pallor.  Psychiatric: Mood, memory and judgment normal.  Vitals reviewed.  LABORATORY DATA: Lab Results  Component Value Date   WBC 14.6 (H) 07/02/2017   HGB 9.0 (L) 07/02/2017   HCT 28.4 (L) 07/02/2017   MCV 75.7 (L) 07/02/2017   PLT 177 07/02/2017      Chemistry      Component Value Date/Time   NA 135 (L)  07/02/2017 0942   K 3.6 07/02/2017 0942   CL 93 (L) 06/21/2017 0220   CO2 25 07/02/2017 0942   BUN 11.6 07/02/2017 0942   CREATININE 1.2 (H) 07/02/2017 0942      Component Value Date/Time   CALCIUM 9.4 07/02/2017 0942   ALKPHOS 565 (H) 07/02/2017 0942   AST 63 (H) 07/02/2017 0942   ALT 52 07/02/2017 0942   BILITOT 0.45 07/02/2017 0942       RADIOGRAPHIC STUDIES:  Dg Chest 2 View  Result Date: 06/21/2017 CLINICAL DATA:  Fever.  Recent diagnosis of pancreatic cancer. EXAM: CHEST  2 VIEW COMPARISON:  Chest CT 05/25/2017 FINDINGS: Tip of the right chest port in the mid SVC. The cardiomediastinal contours are normal. The lungs are clear. Pulmonary vasculature is normal. No consolidation, pleural effusion, or pneumothorax. No acute osseous abnormalities are seen. Biliary stent in the right upper quadrant is partially  included. Nipple shadows noted projecting over the lower chest. IMPRESSION: No active cardiopulmonary disease. Electronically Signed   By: Jeb Levering M.D.   On: 06/21/2017 03:43   Ir US Guide Vasc Access Right  Result Date: 06/04/2017 INDICATION: 51 year old female with pancreatic adenocarcinoma. She requires durable venous access for chemotherapy. EXAM: IMPLANTED PORT A CATH PLACEMENT WITH ULTRASOUND AND FLUOROSCOPIC GUIDANCE MEDICATIONS: 2 g Ancef; The antibiotic was administered within an appropriate time interval prior to skin puncture. ANESTHESIA/SEDATION: Versed 3 mg IV; Fentanyl 100 mcg IV; Moderate Sedation Time:  25 minutes The patient was continuously monitored during the procedure by the interventional radiology nurse under my direct supervision. FLUOROSCOPY TIME:  0 minutes, 12 seconds (1 mGy) COMPLICATIONS: None immediate. PROCEDURE: The right neck and chest was prepped with chlorhexidine, and draped in the usual sterile fashion using maximum barrier technique (cap and mask, sterile gown, sterile gloves, large sterile sheet, hand hygiene and cutaneous antiseptic). Antibiotic prophylaxis was provided with 2g Ancef administered IV one hour prior to skin incision. Local anesthesia was attained by infiltration with 1% lidocaine with epinephrine. Ultrasound demonstrated patency of the right internal jugular vein, and this was documented with an image. Under real-time ultrasound guidance, this vein was accessed with a 21 gauge micropuncture needle and image documentation was performed. A small dermatotomy was made at the access site with an 11 scalpel. A 0.018" wire was advanced into the SVC and the access needle exchanged for a 7F micropuncture vascular sheath. The 0.018" wire was then removed and a 0.035" wire advanced into the IVC. An appropriate location for the subcutaneous reservoir was selected below the clavicle and an incision was made through the skin and underlying soft tissues. The  subcutaneous tissues were then dissected using a combination of blunt and sharp surgical technique and a pocket was formed. A single lumen power injectable portacatheter was then tunneled through the subcutaneous tissues from the pocket to the dermatotomy and the port reservoir placed within the subcutaneous pocket. The venous access site was then serially dilated and a peel away vascular sheath placed over the wire. The wire was removed and the port catheter advanced into position under fluoroscopic guidance. The catheter tip is positioned in the superior cavoatrial junction. This was documented with a spot image. The portacatheter was then tested and found to flush and aspirate well. The port was flushed with saline followed by 100 units/mL heparinized saline. The pocket was then closed in two layers using first subdermal inverted interrupted absorbable sutures followed by a running subcuticular suture. The epidermis was then sealed with Dermabond. The  dermatotomy at the venous access site was also closed with a single inverted subdermal suture and the epidermis sealed with Dermabond. IMPRESSION: Successful placement of a right IJ approach Power Port with ultrasound and fluoroscopic guidance. The catheter is ready for use. Electronically Signed   By: Jacqulynn Cadet M.D.   On: 06/04/2017 14:32   Dg Ercp  Result Date: 06/09/2017 CLINICAL DATA:  ERCP with sphincterotomy and stent placement. History of pancreatic cancer. EXAM: ERCP TECHNIQUE: Multiple spot images obtained with the fluoroscopic device and submitted for interpretation post-procedure. FLUOROSCOPY TIME:  Number of Acquired Spot Images: 4 COMPARISON:  MRI 05/25/2017 FINDINGS: Opacification of the extrahepatic biliary system. Wire was advanced into the intrahepatic ducts. Narrowing and irregularity of the mid and distal common bile duct. A metallic stent was placed across the area of stenosis. IMPRESSION: Stenosis and irregularity in the common bile  duct was treated with stent placement. These images were submitted for radiologic interpretation only. Please see the procedural report for the amount of contrast and the fluoroscopy time utilized. Electronically Signed   By: Markus Daft M.D.   On: 06/09/2017 12:52   Ir Fluoro Guide Port Insertion Right  Result Date: 06/04/2017 INDICATION: 51 year old female with pancreatic adenocarcinoma. She requires durable venous access for chemotherapy. EXAM: IMPLANTED PORT A CATH PLACEMENT WITH ULTRASOUND AND FLUOROSCOPIC GUIDANCE MEDICATIONS: 2 g Ancef; The antibiotic was administered within an appropriate time interval prior to skin puncture. ANESTHESIA/SEDATION: Versed 3 mg IV; Fentanyl 100 mcg IV; Moderate Sedation Time:  25 minutes The patient was continuously monitored during the procedure by the interventional radiology nurse under my direct supervision. FLUOROSCOPY TIME:  0 minutes, 12 seconds (1 mGy) COMPLICATIONS: None immediate. PROCEDURE: The right neck and chest was prepped with chlorhexidine, and draped in the usual sterile fashion using maximum barrier technique (cap and mask, sterile gown, sterile gloves, large sterile sheet, hand hygiene and cutaneous antiseptic). Antibiotic prophylaxis was provided with 2g Ancef administered IV one hour prior to skin incision. Local anesthesia was attained by infiltration with 1% lidocaine with epinephrine. Ultrasound demonstrated patency of the right internal jugular vein, and this was documented with an image. Under real-time ultrasound guidance, this vein was accessed with a 21 gauge micropuncture needle and image documentation was performed. A small dermatotomy was made at the access site with an 11 scalpel. A 0.018" wire was advanced into the SVC and the access needle exchanged for a 85F micropuncture vascular sheath. The 0.018" wire was then removed and a 0.035" wire advanced into the IVC. An appropriate location for the subcutaneous reservoir was selected below the  clavicle and an incision was made through the skin and underlying soft tissues. The subcutaneous tissues were then dissected using a combination of blunt and sharp surgical technique and a pocket was formed. A single lumen power injectable portacatheter was then tunneled through the subcutaneous tissues from the pocket to the dermatotomy and the port reservoir placed within the subcutaneous pocket. The venous access site was then serially dilated and a peel away vascular sheath placed over the wire. The wire was removed and the port catheter advanced into position under fluoroscopic guidance. The catheter tip is positioned in the superior cavoatrial junction. This was documented with a spot image. The portacatheter was then tested and found to flush and aspirate well. The port was flushed with saline followed by 100 units/mL heparinized saline. The pocket was then closed in two layers using first subdermal inverted interrupted absorbable sutures followed by a running subcuticular suture.  The epidermis was then sealed with Dermabond. The dermatotomy at the venous access site was also closed with a single inverted subdermal suture and the epidermis sealed with Dermabond. IMPRESSION: Successful placement of a right IJ approach Power Port with ultrasound and fluoroscopic guidance. The catheter is ready for use. Electronically Signed   By: Jacqulynn Cadet M.D.   On: 06/04/2017 14:32     ASSESSMENT/PLAN:  Pancreatic cancer metastasized to liver ([place]) 1. 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 Adenocarcinoma consistent with pancreatobiliary primary.  Cycle 1 FOLFIRINOX 06/18/2018  2. Pain secondary to #1.  3. Weight losssecondary to #1.  4. Microcytic anemia  5. Mild thrombocytopenia  6. Port-A-Cath placement 06/04/2017  7. Elevated liver enzymes/bili 06/08/2017-likely early biliary obstruction secondary to pancreas cancer, status post ERCP-placement of a common bile duct stent 06/09/2018   Disposition:  Her performance status appears improved.  The elevated liver enzymes have improved.  The plan is to proceed with cycle 2 FOLFIRINOX today.  Dose will be adjusted for her decrease in weight.  The patient received 1 L of normal saline when she comes in on Saturday for her pump DC.  She was given a prescription for dexamethasone 4 mg twice a day times 3 days to start the day after her chemotherapy.  This will help with delayed nausea and vomiting and hopefully help increase her appetite.  Since she is losing weight on the Remeron, recommend that she discontinue the Remeron.  A call has been placed to Ernestene Kiel for dietitian consult today in infusion.  Refills were provided for her hydromorphone and lorazepam.  I have instructed the patient and her sister not to cut the MS Contin.  I have provided a prescription for MS Contin 15 mg twice a day which is a lower dose that she was taking.  Her pain seems overall well controlled with hydromorphone and her family does not want her to be oversedated with a higher dose of MS Contin.  The patient will return in 1 week for repeat labs and evaluation of her symptoms.  We will follow-up in 2 weeks for evaluation prior to cycle 3 of her chemotherapy.  She will continue the current laxative regimen.  She will discontinue laxatives if she develops diarrhea.  Denies use Imodium if she develops diarrhea.  Her blood pressure is elevated today in our office.  This is consistent with her previous blood  pressures in our office.  The patient reports that she checks her blood pressure at home and is not elevated.  Encouraged her to continue to check her blood pressure on a regular basis.  Plan reviewed and patient was seen with Dr. Benay Spice.   Orders Placed This Encounter  Procedures  . CBC with Differential/Platelet    Standing Status:   Future    Standing Expiration Date:   07/02/2018  . Comprehensive metabolic panel    Standing Status:   Future    Standing Expiration Date:   07/02/2018  . CBC with Differential/Platelet    Standing Status:   Future    Standing Expiration Date:   07/02/2018  . Comprehensive metabolic panel  Standing Status:   Future    Standing Expiration Date:   07/02/2018    Crystal Bussing, DNP, AGPCNP-BC, AOCNP 07/02/17  This was a shared visit with Crystal Carlson.  Ms. Ferrero appears to have an improved performance status.  The plan is to proceed with cycle 2 FOLFIRINOX today.  She will receive intravenous fluids on day 3.  She will return for an office visit in 1 week.  We prescribed Decadron prophylaxis for delayed nausea.  We adjusted the chemotherapy doses based on her current weight.  [name], MD

## 2017-07-02 NOTE — Progress Notes (Signed)
51 year old female diagnosed with metastatic pancreas cancer.  She is a patient of Dr. [name].  Past medical history includes chronic pancreatitis, anemia, and prediabetes.  Medications include Decadron, Colace, ferrous sulfate, Ativan, MiraLAX, and Compazine.  Labs include sodium 135, creatinine 1.2 and albumin 2.8.  Height: 64 inches. Weight: 123.2 pounds. Usual body weight: 138.5 pounds October 22. BMI 21.5.  Patient endorses poor appetite and weight loss. She struggles with nausea and vomiting as well as constipation and diarrhea. Complains of early satiety.   Patient has lost 11% of her body weight in less than 4 weeks. Physical exam deferred.  Noted no edema.  Nutrition diagnosis: Inadequate oral intake related to metastatic cancer as evidenced by 11% weight loss in 4 weeks.  Intervention: Patient was educated to consume smaller more frequent meals and snacks. Reviewed high protein high calorie choices with patient. Encouraged oral intake every 2 hours. Recommended oral nutrition supplements and educated on ways to improve taste. Fact sheets given and contact information was provided.   Questions Were Answered.  Teach Back Method Used.  Monitoring, Evaluation, Goals: Patient Will Work to [employer] and Protein to Minimize Further Weight Loss.  Next Visit: Thursday December 13, during infusion.  **Disclaimer: This note was dictated with voice recognition software. Similar sounding words can inadvertently be transcribed and this note may contain transcription errors which may not have been corrected upon publication of note.**

## 2017-07-02 NOTE — Progress Notes (Signed)
Ok to waste 5FU pump leftover on Saturday per Dr. Benay Spice.  Cyndia Bent RN

## 2017-07-02 NOTE — Telephone Encounter (Signed)
Printed avs and calender fore upcoming appointment.per 11/15 los

## 2017-07-02 NOTE — Patient Instructions (Signed)
Stop taking Remeron (mertazipine) A prescription for Dexamethasone was sent to your pharmacy. Take 1 tablet twice a day for 3 days. Start the day after chemo. Please stop by scheduling to schedule an apt for IV fluids on Saturday.  Return to see [place] next week. Please schedule this appointment.

## 2017-07-03 MED FILL — MORPHINE SULF ER 15 MG TAB: 15 | 30 days supply | Qty: 60 | Fill #0

## 2017-07-03 MED FILL — HYDROmorphone HCL 4 MG TABS: 4 | 5 days supply | Qty: 60 | Fill #0

## 2017-07-03 MED FILL — LORazepam 0.5 MG TABS: 0.5 | 7 days supply | Qty: 30 | Fill #0

## 2017-07-04 ENCOUNTER — Ambulatory Visit (HOSPITAL_BASED_OUTPATIENT_CLINIC_OR_DEPARTMENT_OTHER): Payer: Medicaid Other

## 2017-07-04 VITALS — BP 133/91 | HR 86 | Temp 98.4°F

## 2017-07-04 DIAGNOSIS — C259 Malignant neoplasm of pancreas, unspecified: Secondary | ICD-10-CM

## 2017-07-04 DIAGNOSIS — Z5189 Encounter for other specified aftercare: Secondary | ICD-10-CM | POA: Diagnosis not present

## 2017-07-04 DIAGNOSIS — C787 Secondary malignant neoplasm of liver and intrahepatic bile duct: Principal | ICD-10-CM

## 2017-07-04 DIAGNOSIS — C257 Malignant neoplasm of other parts of pancreas: Secondary | ICD-10-CM

## 2017-07-04 MED ORDER — PEGFILGRASTIM INJECTION 6 MG/0.6ML ~~LOC~~
6.0000 mg | PREFILLED_SYRINGE | Freq: Once | SUBCUTANEOUS | Status: AC
  Administered 2017-07-04: 6 mg via SUBCUTANEOUS

## 2017-07-04 MED ORDER — HEPARIN SOD (PORK) LOCK FLUSH 100 UNIT/ML IV SOLN
500.0000 [IU] | Freq: Once | INTRAVENOUS | Status: AC | PRN
  Administered 2017-07-04: 500 [IU]
  Filled 2017-07-04: qty 5

## 2017-07-04 MED ORDER — SODIUM CHLORIDE 0.9% FLUSH
10.0000 mL | INTRAVENOUS | Status: DC | PRN
  Administered 2017-07-04: 10 mL
  Filled 2017-07-04: qty 10

## 2017-07-04 NOTE — Patient Instructions (Addendum)
Fiber Content in Foods See the following list for the dietary fiber content of some common foods. High-fiber foods High-fiber foods contain 4 grams or more (4g or more) of fiber per serving. They include:  Artichoke (fresh) - 1 medium has 10.3g of fiber.  Baked beans, plain or vegetarian (canned) -  cup has 5.2g of fiber.  Blackberries or raspberries (fresh) -  cup has 4g of fiber.  Bran cereal -  cup has 8.6g of fiber.  Bulgur (cooked) -  cup has 4g of fiber.  Kidney beans (canned) -  cup has 6.8g of fiber.  Lentils (cooked) -  cup has 7.8g of fiber.  Pear (fresh) - 1 medium has 5.1g of fiber.  Peas (frozen) -  cup has 4.4g of fiber.  Pinto beans (canned) -  cup has 5.5g of fiber.  Pinto beans (dried and cooked) -  cup has 7.7g of fiber.  Potato with skin (baked) - 1 medium has 4.4g of fiber.  Quinoa (cooked) -  cup has 5g of fiber.  Soybeans (canned, frozen, or fresh) -  cup has 5.1g of fiber.  Moderate-fiber foods Moderate-fiber foods contain 1-4 grams (1-4g) of fiber per serving. They include:  Almonds - 1 oz. has 3.5g of fiber.  Apple with skin - 1 medium has 3.3g of fiber.  Applesauce, sweetened -  cup has 1.5g of fiber.  Bagel, plain - one 4-inch (10-cm) bagel has 2g of fiber.  Banana - 1 medium has 3.1g of fiber.  Broccoli (cooked) -  cup has 2.5g of fiber.  Carrots (cooked) -  cup has 2.3g of fiber.  Corn (canned or frozen) -  cup has 2.1g of fiber.  Corn tortilla - one 6-inch (15-cm) tortilla has 1.5g of fiber.  Green beans (canned) -  cup has 2g of fiber.  Instant oatmeal -  cup has about 2g of fiber.  Long-grain brown rice (cooked) - 1 cup has 3.5g of fiber.  Macaroni, enriched (cooked) - 1 cup has 2.5g of fiber.  Melon - 1 cup has 1.4g of fiber.  Multigrain cereal -  cup has about 2-4g of fiber.  Orange - 1 small has 3.1g of fiber.  Potatoes, mashed -  cup has 1.6g of fiber.  Raisins - 1/4 cup has 1.6g of  fiber.  Squash -  cup has 2.9g of fiber.  Sunflower seeds -  cup has 1.1g of fiber.  Tomato - 1 medium has 1.5g of fiber.  Vegetable or soy patty - 1 has 3.4g of fiber.  Whole-wheat bread - 1 slice has 2g of fiber.  Whole-wheat spaghetti -  cup has 3.2g of fiber.  Low-fiber foods Low-fiber foods contain less than 1 gram (less than 1g) of fiber per serving. They include:  Egg - 1 large.  Flour tortilla - one 6-inch (15-cm) tortilla.  Fruit juice -  cup.  Lettuce - 1 cup.  Meat, poultry, or fish - 1 oz.  Milk - 1 cup.  Spinach (raw) - 1 cup.  White bread - 1 slice.  White rice -  cup.  Yogurt -  cup.  Actual amounts of fiber in foods may be different depending on processing. Talk with your dietitian about how much fiber you need in your diet. This information is not intended to replace advice given to you by your health care provider. Make sure you discuss any questions you have with your health care provider. Document Released: 12/21/2006 Document Revised: 01/10/2016 Document Reviewed: 09/27/2015 Elsevier Interactive Patient  Education  2018 [employer].  Pegfilgrastim injection What is this medicine? PEGFILGRASTIM (PEG fil gra stim) is a long-acting granulocyte colony-stimulating factor that stimulates the growth of neutrophils, a type of white blood cell important in the body's fight against infection. It is used to reduce the incidence of fever and infection in patients with certain types of cancer who are receiving chemotherapy that affects the bone marrow, and to increase survival after being exposed to high doses of radiation. This medicine may be used for other purposes; ask your health care provider or pharmacist if you have questions. COMMON BRAND NAME(S): Neulasta What should I tell my health care provider before I take this medicine? They need to know if you have any of these conditions: -kidney disease -latex allergy -ongoing radiation  therapy -sickle cell disease -skin reactions to acrylic adhesives (On-Body Injector only) -an unusual or allergic reaction to pegfilgrastim, filgrastim, other medicines, foods, dyes, or preservatives -pregnant or trying to get pregnant -breast-feeding How should I use this medicine? This medicine is for injection under the skin. If you get this medicine at home, you will be taught how to prepare and give the pre-filled syringe or how to use the On-body Injector. Refer to the patient Instructions for Use for detailed instructions. Use exactly as directed. Tell your healthcare provider immediately if you suspect that the On-body Injector may not have performed as intended or if you suspect the use of the On-body Injector resulted in a missed or partial dose. It is important that you put your used needles and syringes in a special sharps container. Do not put them in a trash can. If you do not have a sharps container, call your pharmacist or healthcare provider to get one. Talk to your pediatrician regarding the use of this medicine in children. While this drug may be prescribed for selected conditions, precautions do apply. Overdosage: If you think you have taken too much of this medicine contact a poison control center or emergency room at once. NOTE: This medicine is only for you. Do not share this medicine with others. What if I miss a dose? It is important not to miss your dose. Call your doctor or health care professional if you miss your dose. If you miss a dose due to an On-body Injector failure or leakage, a new dose should be administered as soon as possible using a single prefilled syringe for manual use. What may interact with this medicine? Interactions have not been studied. Give your health care provider a list of all the medicines, herbs, non-prescription drugs, or dietary supplements you use. Also tell them if you smoke, drink alcohol, or use illegal drugs. Some items may interact with  your medicine. This list may not describe all possible interactions. Give your health care provider a list of all the medicines, herbs, non-prescription drugs, or dietary supplements you use. Also tell them if you smoke, drink alcohol, or use illegal drugs. Some items may interact with your medicine. What should I watch for while using this medicine? You may need blood work done while you are taking this medicine. If you are going to need a MRI, CT scan, or other procedure, tell your doctor that you are using this medicine (On-Body Injector only). What side effects may I notice from receiving this medicine? Side effects that you should report to your doctor or health care professional as soon as possible: -allergic reactions like skin rash, itching or hives, swelling of the face, lips, or tongue -dizziness -  fever -pain, redness, or irritation at site where injected -pinpoint red spots on the skin -red or dark-brown urine -shortness of breath or breathing problems -stomach or side pain, or pain at the shoulder -swelling -tiredness -trouble passing urine or change in the amount of urine Side effects that usually do not require medical attention (report to your doctor or health care professional if they continue or are bothersome): -bone pain -muscle pain This list may not describe all possible side effects. Call your doctor for medical advice about side effects. You may report side effects to FDA at 1-800-FDA-1088. Where should I keep my medicine? Keep out of the reach of children. Store pre-filled syringes in a refrigerator between 2 and 8 degrees C (36 and 46 degrees F). Do not freeze. Keep in carton to protect from light. Throw away this medicine if it is left out of the refrigerator for more than 48 hours. Throw away any unused medicine after the expiration date. NOTE: This sheet is a summary. It may not cover all possible information. If you have questions about this medicine, talk to  your doctor, pharmacist, or health care provider.  2018 Elsevier/Gold Standard (2016-07-31 12:58:03)

## 2017-07-04 NOTE — Progress Notes (Signed)
Patient presents to clinic for pump d/c and IV Fluids. Patient's BP elevated. Alerted Dr. Jana Hakim and he instructed to hold IVF and instruct patient to drink fluids orally. Patient and family voiced understanding. Patient also reported "constipation since 11/12". Also reported "we have not tried Miralax to help". Patient and family instructed to take Miralax or a stool softener and to increase the amount of fiber and fluids in the diet. Patient and family voiced understanding and know to alert emergency services with any emergent concerns.   Wylene Simmer, BSN, RN 07/04/2017 11:57 AM

## 2017-07-07 ENCOUNTER — Telehealth: Payer: Self-pay

## 2017-07-07 DIAGNOSIS — K8689 Other specified diseases of pancreas: Secondary | ICD-10-CM

## 2017-07-07 DIAGNOSIS — C787 Secondary malignant neoplasm of liver and intrahepatic bile duct: Secondary | ICD-10-CM

## 2017-07-07 MED ORDER — PROCHLORPERAZINE MALEATE 10 MG PO TABS: 10.0000 mg | ORAL_TABLET | Freq: Four times a day (QID) | ORAL | 2 refills | Status: DC | PRN

## 2017-07-07 MED FILL — PROCHLORPERAZINE 10 MG TAB: 10 | 8 days supply | Qty: 30 | Fill #0

## 2017-07-07 NOTE — Telephone Encounter (Addendum)
Pt had abdominal pain last night. She used MS contin and dilaudid. A "different" abdominal pain not same as constipation, not same as pancreatic cancer pain prior to chemo. She did have BM this morning. Husband is not sure if her abdomen is swollen. No fever. She did eat rice and a few bites of beef patty. Now she is having the same pain as last night. She took dilaudid about 30 minutes ago. Instructed husband to call wife about 73 and assess pain. If she feels she needs to be seen in [facility] to call us back or wait until appt tomorrow if dilaudid works.   He aslo requested compazine refill. Done.

## 2017-07-08 ENCOUNTER — Other Ambulatory Visit (HOSPITAL_BASED_OUTPATIENT_CLINIC_OR_DEPARTMENT_OTHER): Payer: Medicaid Other

## 2017-07-08 ENCOUNTER — Other Ambulatory Visit: Payer: Self-pay | Admitting: Emergency Medicine

## 2017-07-08 ENCOUNTER — Other Ambulatory Visit: Payer: Self-pay

## 2017-07-08 ENCOUNTER — Ambulatory Visit (HOSPITAL_BASED_OUTPATIENT_CLINIC_OR_DEPARTMENT_OTHER): Payer: Medicaid Other | Admitting: Oncology

## 2017-07-08 ENCOUNTER — Ambulatory Visit: Payer: Self-pay

## 2017-07-08 ENCOUNTER — Ambulatory Visit: Payer: Self-pay | Admitting: Nutrition

## 2017-07-08 ENCOUNTER — Ambulatory Visit (HOSPITAL_BASED_OUTPATIENT_CLINIC_OR_DEPARTMENT_OTHER): Payer: Medicaid Other

## 2017-07-08 VITALS — BP 138/87 | HR 87 | Temp 99.3°F

## 2017-07-08 VITALS — BP 147/100 | HR 107 | Temp 98.6°F | Resp 16 | Ht 64.0 in | Wt 121.5 lb

## 2017-07-08 DIAGNOSIS — C787 Secondary malignant neoplasm of liver and intrahepatic bile duct: Principal | ICD-10-CM

## 2017-07-08 DIAGNOSIS — D696 Thrombocytopenia, unspecified: Secondary | ICD-10-CM | POA: Diagnosis not present

## 2017-07-08 DIAGNOSIS — C259 Malignant neoplasm of pancreas, unspecified: Secondary | ICD-10-CM

## 2017-07-08 DIAGNOSIS — Z95828 Presence of other vascular implants and grafts: Secondary | ICD-10-CM

## 2017-07-08 DIAGNOSIS — G893 Neoplasm related pain (acute) (chronic): Secondary | ICD-10-CM | POA: Diagnosis not present

## 2017-07-08 DIAGNOSIS — K8689 Other specified diseases of pancreas: Secondary | ICD-10-CM

## 2017-07-08 DIAGNOSIS — C257 Malignant neoplasm of other parts of pancreas: Secondary | ICD-10-CM | POA: Diagnosis not present

## 2017-07-08 DIAGNOSIS — E860 Dehydration: Secondary | ICD-10-CM

## 2017-07-08 DIAGNOSIS — D649 Anemia, unspecified: Secondary | ICD-10-CM

## 2017-07-08 LAB — CBC WITH DIFFERENTIAL/PLATELET
BASO%: 0.2 % (ref 0.0–2.0)
Basophils Absolute: 0 10*3/uL (ref 0.0–0.1)
EOS%: 0.3 % (ref 0.0–7.0)
Eosinophils Absolute: 0 10*3/uL (ref 0.0–0.5)
HCT: 30 % — ABNORMAL LOW (ref 34.8–46.6)
HEMOGLOBIN: 9.5 g/dL — AB (ref 11.6–15.9)
LYMPH%: 11.3 % — ABNORMAL LOW (ref 14.0–49.7)
MCH: 24.2 pg — ABNORMAL LOW (ref 25.1–34.0)
MCHC: 31.7 g/dL (ref 31.5–36.0)
MCV: 76.3 fL — ABNORMAL LOW (ref 79.5–101.0)
MONO#: 0.2 10*3/uL (ref 0.1–0.9)
MONO%: 3 % (ref 0.0–14.0)
NEUT#: 5.5 10*3/uL (ref 1.5–6.5)
NEUT%: 85.2 % — ABNORMAL HIGH (ref 38.4–76.8)
Platelets: 160 10*3/uL (ref 145–400)
RBC: 3.93 10*6/uL (ref 3.70–5.45)
RDW: 16 % — AB (ref 11.2–14.5)
WBC: 6.4 10*3/uL (ref 3.9–10.3)
lymph#: 0.7 10*3/uL — ABNORMAL LOW (ref 0.9–3.3)

## 2017-07-08 LAB — COMPREHENSIVE METABOLIC PANEL
ALBUMIN: 2.9 g/dL — AB (ref 3.5–5.0)
ALK PHOS: 510 U/L — AB (ref 40–150)
ALT: 102 U/L — AB (ref 0–55)
ANION GAP: 10 meq/L (ref 3–11)
AST: 71 U/L — ABNORMAL HIGH (ref 5–34)
BUN: 20.4 mg/dL (ref 7.0–26.0)
CALCIUM: 9 mg/dL (ref 8.4–10.4)
CHLORIDE: 99 meq/L (ref 98–109)
CO2: 26 mEq/L (ref 22–29)
CREATININE: 1.1 mg/dL (ref 0.6–1.1)
EGFR: 59 mL/min/{1.73_m2} — ABNORMAL LOW (ref >60)
Glucose: 122 mg/dl (ref 70–140)
Potassium: 3.2 mEq/L — ABNORMAL LOW (ref 3.5–5.1)
Sodium: 135 mEq/L — ABNORMAL LOW (ref 136–145)
Total Bilirubin: 0.88 mg/dL (ref 0.20–1.20)
Total Protein: 6.8 g/dL (ref 6.4–8.3)

## 2017-07-08 MED ORDER — HEPARIN SOD (PORK) LOCK FLUSH 100 UNIT/ML IV SOLN
500.0000 [IU] | Freq: Once | INTRAVENOUS | Status: AC
  Administered 2017-07-08: 500 [IU]
  Filled 2017-07-08: qty 5

## 2017-07-08 MED ORDER — SODIUM CHLORIDE 0.9% FLUSH
10.0000 mL | Freq: Once | INTRAVENOUS | Status: AC
  Administered 2017-07-08: 10 mL
  Filled 2017-07-08: qty 10

## 2017-07-08 MED ORDER — SORBITOL 70 % PO SOLN: 30.0000 mL | Freq: Two times a day (BID) | ORAL | 0 refills | Status: DC

## 2017-07-08 MED ORDER — POTASSIUM CHLORIDE 2 MEQ/ML IV SOLN
Freq: Once | INTRAVENOUS | Status: AC
  Administered 2017-07-08: 13:00:00 via INTRAVENOUS
  Filled 2017-07-08: qty 1000

## 2017-07-08 MED ORDER — POTASSIUM CHLORIDE CRYS ER 20 MEQ PO TBCR: 20.0000 meq | EXTENDED_RELEASE_TABLET | Freq: Every day | ORAL | 2 refills | Status: DC

## 2017-07-08 MED FILL — SORBITOL 70% SOLUTION: 70 | 7 days supply | Qty: 474 | Fill #0

## 2017-07-08 MED FILL — POTASSIUM CL ER 20 MEQ TAB: 20 | 30 days supply | Qty: 30 | Fill #0

## 2017-07-08 NOTE — Progress Notes (Signed)
  [facility] OFFICE PROGRESS NOTE   Diagnosis: Pancreas cancer  INTERVAL HISTORY:   Ms. Fewell returns as scheduled.  She completed another cycle of FOLFIRINOX on 07/02/2017.  She reports no nausea or vomiting.  She reports one episode of diarrhea following chemotherapy.  The abdominal pain is much improved.  She takes Dilaudid infrequently.  She has a poor appetite.  No neuropathy symptoms.  Objective:  Vital signs in last 24 hours:  Blood pressure (!) 147/100, pulse (!) 107, temperature 98.6 F (37 C), temperature source Oral, resp. rate 16, height 5\' 4"  (1.626 m), weight 121 lb 8 oz (55.1 kg), SpO2 100 %.    HEENT: White coat over the tongue, no buccal thrush, no ulcers Resp: Lungs clear bilaterally Cardio: Regular rate and rhythm GI: No hepatomegaly, no mass, nontender Vascular: Leg edema Skin: Palms without erythema  Portacath/PICC-without erythema  Lab Results:  Lab Results  Component Value Date   WBC 6.4 07/08/2017   HGB 9.5 (L) 07/08/2017   HCT 30.0 (L) 07/08/2017   MCV 76.3 (L) 07/08/2017   PLT 160 07/08/2017   NEUTROABS 5.5 07/08/2017    CMP     Component Value Date/Time   NA 135 (L) 07/08/2017 1029   K 3.2 (L) 07/08/2017 1029   CL 93 (L) 06/21/2017 0220   CO2 26 07/08/2017 1029   GLUCOSE 122 07/08/2017 1029   BUN 20.4 07/08/2017 1029   CREATININE 1.1 07/08/2017 1029   CALCIUM 9.0 07/08/2017 1029   PROT 6.8 07/08/2017 1029   ALBUMIN 2.9 (L) 07/08/2017 1029   AST 71 (H) 07/08/2017 1029   ALT 102 (H) 07/08/2017 1029   ALKPHOS 510 (H) 07/08/2017 1029   BILITOT 0.88 07/08/2017 1029   GFRNONAA 35 (L) 06/21/2017 0220   GFRAA 41 (L) 06/21/2017 0220     Medications: I have reviewed the patient's current medications.  Assessment/Plan: 1. 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 Adenocarcinoma consistent with pancreatobiliary primary.  Cycle 1 FOLFIRINOX 06/18/2018  2. Pain secondary to #1.  3. Weight losssecondary to #1.  4. Microcytic anemia  5. Mild thrombocytopenia  6. Port-A-Cath placement 06/04/2017  7. Elevated liver enzymes/bili10/22/2018-likely early biliary obstruction secondary to pancreas cancer, status post ERCP-placement of a common bile duct stent 06/09/2018   Disposition:   Ms. Hanaway is now day 7 following cycle 2 FOLFIRINOX.  She continues to have a poor appetite.  Her appetite did not improve when she took Decadron prophylaxis following cycle 2.  She appears dehydrated.  She will receive IV fluids and potassium replacement today.  She will begin a potassium supplement.  We will arrange for evaluation by the Cancer center nutritionist.  I encouraged her to try nutrition supplements and push fluids.  She will decrease the MS Contin to once daily and continue Dilaudid as needed for breakthrough pain.  25 minutes were spent with the patient today.  The majority of the time was used for counseling and coordination of care.  Betsy Coder, MD  07/08/2017  11:54 AM

## 2017-07-08 NOTE — Patient Instructions (Signed)
Dehydration, Adult Dehydration is a condition in which there is not enough fluid or water in the body. This happens when you lose more fluids than you take in. Important organs, such as the kidneys, brain, and heart, cannot function without a proper amount of fluids. Any loss of fluids from the body can lead to dehydration. Dehydration can range from mild to severe. This condition should be treated right away to prevent it from becoming severe. What are the causes? This condition may be caused by:  Vomiting.  Diarrhea.  Excessive sweating, such as from heat exposure or exercise.  Not drinking enough fluid, especially: ? When ill. ? While doing activity that requires a lot of energy.  Excessive urination.  Fever.  Infection.  Certain medicines, such as medicines that cause the body to lose excess fluid (diuretics).  Inability to access safe drinking water.  Reduced physical ability to get adequate water and food.  What increases the risk? This condition is more likely to develop in people:  Who have a poorly controlled long-term (chronic) illness, such as diabetes, heart disease, or kidney disease.  Who are age 65 or older.  Who are disabled.  Who live in a place with high altitude.  Who play endurance sports.  What are the signs or symptoms? Symptoms of mild dehydration may include:  Thirst.  Dry lips.  Slightly dry mouth.  Dry, warm skin.  Dizziness. Symptoms of moderate dehydration may include:  Very dry mouth.  Muscle cramps.  Dark urine. Urine may be the color of tea.  Decreased urine production.  Decreased tear production.  Heartbeat that is irregular or faster than normal (palpitations).  Headache.  Light-headedness, especially when you stand up from a sitting position.  Fainting (syncope). Symptoms of severe dehydration may include:  Changes in skin, such as: ? Cold and clammy skin. ? Blotchy (mottled) or pale skin. ? Skin that does  not quickly return to normal after being lightly pinched and released (poor skin turgor).  Changes in body fluids, such as: ? Extreme thirst. ? No tear production. ? Inability to sweat when body temperature is high, such as in hot weather. ? Very little urine production.  Changes in vital signs, such as: ? Weak pulse. ? Pulse that is more than 100 beats a minute when sitting still. ? Rapid breathing. ? Low blood pressure.  Other changes, such as: ? Sunken eyes. ? Cold hands and feet. ? Confusion. ? Lack of energy (lethargy). ? Difficulty waking up from sleep. ? Short-term weight loss. ? Unconsciousness. How is this diagnosed? This condition is diagnosed based on your symptoms and a physical exam. Blood and urine tests may be done to help confirm the diagnosis. How is this treated? Treatment for this condition depends on the severity. Mild or moderate dehydration can often be treated at home. Treatment should be started right away. Do not wait until dehydration becomes severe. Severe dehydration is an emergency and it needs to be treated in a hospital. Treatment for mild dehydration may include:  Drinking more fluids.  Replacing salts and minerals in your blood (electrolytes) that you may have lost. Treatment for moderate dehydration may include:  Drinking an oral rehydration solution (ORS). This is a drink that helps you replace fluids and electrolytes (rehydrate). It can be found at pharmacies and retail stores. Treatment for severe dehydration may include:  Receiving fluids through an IV tube.  Receiving an electrolyte solution through a feeding tube that is passed through your nose   and into your stomach (nasogastric tube, or NG tube).  Correcting any abnormalities in electrolytes.  Treating the underlying cause of dehydration. Follow these instructions at home:  If directed by your health care provider, drink an ORS: ? Make an ORS by following instructions on the  package. ? Start by drinking small amounts, about  cup (120 mL) every 5-10 minutes. ? Slowly increase how much you drink until you have taken the amount recommended by your health care provider.  Drink enough clear fluid to keep your urine clear or pale yellow. If you were told to drink an ORS, finish the ORS first, then start slowly drinking other clear fluids. Drink fluids such as: ? Water. Do not drink only water. Doing that can lead to having too little salt (sodium) in the body (hyponatremia). ? Ice chips. ? Fruit juice that you have added water to (diluted fruit juice). ? Low-calorie sports drinks.  Avoid: ? Alcohol. ? Drinks that contain a lot of sugar. These include high-calorie sports drinks, fruit juice that is not diluted, and soda. ? Caffeine. ? Foods that are greasy or contain a lot of fat or sugar.  Take over-the-counter and prescription medicines only as told by your health care provider.  Do not take sodium tablets. This can lead to having too much sodium in the body (hypernatremia).  Eat foods that contain a healthy balance of electrolytes, such as bananas, oranges, potatoes, tomatoes, and spinach.  Keep all follow-up visits as told by your health care provider. This is important. Contact a health care provider if:  You have abdominal pain that: ? Gets worse. ? Stays in one area (localizes).  You have a rash.  You have a stiff neck.  You are more irritable than usual.  You are sleepier or more difficult to wake up than usual.  You feel weak or dizzy.  You feel very thirsty.  You have urinated only a small amount of very dark urine over 6-8 hours. Get help right away if:  You have symptoms of severe dehydration.  You cannot drink fluids without vomiting.  Your symptoms get worse with treatment.  You have a fever.  You have a severe headache.  You have vomiting or diarrhea that: ? Gets worse. ? Does not go away.  You have blood or green matter  (bile) in your vomit.  You have blood in your stool. This may cause stool to look black and tarry.  You have not urinated in 6-8 hours.  You faint.  Your heart rate while sitting still is over 100 beats a minute.  You have trouble breathing. This information is not intended to replace advice given to you by your health care provider. Make sure you discuss any questions you have with your health care provider. Document Released: 08/04/2005 Document Revised: 02/29/2016 Document Reviewed: 09/28/2015 Elsevier Interactive Patient Education  2018 [employer]    Hypokalemia Hypokalemia means that the amount of potassium in the blood is lower than normal.Potassium is a chemical that helps regulate the amount of fluid in the body (electrolyte). It also stimulates muscle tightening (contraction) and helps nerves work properly.Normally, most of the body's potassium is inside of cells, and only a very small amount is in the blood. Because the amount in the blood is so small, minor changes to potassium levels in the blood can be life-threatening. What are the causes? This condition may be caused by:  Antibiotic medicine.  Diarrhea or vomiting. Taking too much of a medicine   that helps you have a bowel movement (laxative) can cause diarrhea and lead to hypokalemia.  Chronic kidney disease (CKD).  Medicines that help the body get rid of excess fluid (diuretics).  Eating disorders, such as bulimia.  Low magnesium levels in the body.  Sweating a lot.  What are the signs or symptoms? Symptoms of this condition include:  Weakness.  Constipation.  Fatigue.  Muscle cramps.  Mental confusion.  Skipped heartbeats or irregular heartbeat (palpitations).  Tingling or numbness.  How is this diagnosed? This condition is diagnosed with a blood test. How is this treated? Hypokalemia can be treated by taking potassium supplements by mouth or adjusting the medicines that you take.  Treatment may also include eating more foods that contain a lot of potassium. If your potassium level is very low, you may need to get potassium through an IV tube in one of your veins and be monitored in the hospital. Follow these instructions at home:  Take over-the-counter and prescription medicines only as told by your health care provider. This includes vitamins and supplements.  Eat a healthy diet. A healthy diet includes fresh fruits and vegetables, whole grains, healthy fats, and lean proteins.  If instructed, eat more foods that contain a lot of potassium, such as: ? Nuts, such as peanuts and pistachios. ? Seeds, such as sunflower seeds and pumpkin seeds. ? Peas, lentils, and lima beans. ? Whole grain and bran cereals and breads. ? Fresh fruits and vegetables, such as apricots, avocado, bananas, cantaloupe, kiwi, oranges, tomatoes, asparagus, and potatoes. ? Orange juice. ? Tomato juice. ? Red meats. ? Yogurt.  Keep all follow-up visits as told by your health care provider. This is important. Contact a health care provider if:  You have weakness that gets worse.  You feel your heart pounding or racing.  You vomit.  You have diarrhea.  You have diabetes (diabetes mellitus) and you have trouble keeping your blood sugar (glucose) in your target range. Get help right away if:  You have chest pain.  You have shortness of breath.  You have vomiting or diarrhea that lasts for more than 2 days.  You faint. This information is not intended to replace advice given to you by your health care provider. Make sure you discuss any questions you have with your health care provider. Document Released: 08/04/2005 Document Revised: 03/22/2016 Document Reviewed: 03/22/2016 Elsevier Interactive Patient Education  2018 [employer] Inc[employer]  

## 2017-07-08 NOTE — Progress Notes (Signed)
  Oncology Nurse Navigator Documentation  Navigator Location: [facility] (07/08/17 1154)   )Navigator Encounter Type: Follow-up Appt (07/08/17 1154)                     Patient Visit Type: MedOnc (07/08/17 1154)   Barriers/Navigation Needs: (Nutrition needs) (07/08/17 1154)   Interventions: Referrals (07/08/17 1154) Referrals: Nutrition/dietician (07/08/17 1154)  Met with patient and Dr. Benay Spice during f/u appointment. Patient's sister voiced significant concern that patient "has no appetite." Referral to nutrition placed.        Acuity: Level 2 (07/08/17 1154)   Acuity Level 2: Referrals such as genetics, survivorship;Ongoing guidance and education throughout treatment as needed (07/08/17 1154)     Time Spent with Patient: 30 (07/08/17 1154)

## 2017-07-08 NOTE — Progress Notes (Signed)
Nutrition follow-up completed with patient who is being treated for metastatic pancreas cancer. Patient last seen by cardiology on November 15. Weight decreased and documented 121.5 pounds November 21, down from 123.2 pounds November 15. Labs reviewed.  Noted sodium 135, potassium 3.2, 2.9. Patient continues to struggle with appetite and has difficulty eating often. Patient denies nausea and vomiting but states she has diarrhea Patient is refusing oral nutrition supplements.  Nutrition diagnosis: Inadequate oral intake continues.  Intervention: I provided the port and education for patient to continue to try small frequent meals now mix. Reviewed strategies for diarrhea. Teach back method used.  Monitoring, evaluation, goals: Patient will try to work to increase calories and protein.  Next visit: Thursday, December 13, during infusion.  **Disclaimer: This note was dictated with voice recognition software. Similar sounding words can inadvertently be transcribed and this note may contain transcription errors which may not have been corrected upon publication of note.**

## 2017-07-12 ENCOUNTER — Other Ambulatory Visit: Payer: Self-pay | Admitting: Oncology

## 2017-07-16 ENCOUNTER — Ambulatory Visit: Payer: Medicaid Other

## 2017-07-16 ENCOUNTER — Ambulatory Visit (HOSPITAL_BASED_OUTPATIENT_CLINIC_OR_DEPARTMENT_OTHER): Payer: Medicaid Other | Admitting: Nurse Practitioner

## 2017-07-16 ENCOUNTER — Other Ambulatory Visit (HOSPITAL_BASED_OUTPATIENT_CLINIC_OR_DEPARTMENT_OTHER): Payer: Medicaid Other

## 2017-07-16 ENCOUNTER — Encounter: Payer: Self-pay | Admitting: Nurse Practitioner

## 2017-07-16 ENCOUNTER — Ambulatory Visit (HOSPITAL_BASED_OUTPATIENT_CLINIC_OR_DEPARTMENT_OTHER): Payer: Medicaid Other

## 2017-07-16 VITALS — BP 139/95 | HR 89 | Temp 98.7°F | Resp 20

## 2017-07-16 VITALS — BP 137/88 | HR 104 | Temp 98.4°F | Resp 24 | Ht 64.0 in | Wt 119.3 lb

## 2017-07-16 DIAGNOSIS — C257 Malignant neoplasm of other parts of pancreas: Secondary | ICD-10-CM

## 2017-07-16 DIAGNOSIS — C259 Malignant neoplasm of pancreas, unspecified: Secondary | ICD-10-CM

## 2017-07-16 DIAGNOSIS — C787 Secondary malignant neoplasm of liver and intrahepatic bile duct: Secondary | ICD-10-CM

## 2017-07-16 DIAGNOSIS — D630 Anemia in neoplastic disease: Secondary | ICD-10-CM

## 2017-07-16 DIAGNOSIS — Z5111 Encounter for antineoplastic chemotherapy: Secondary | ICD-10-CM

## 2017-07-16 DIAGNOSIS — Z95828 Presence of other vascular implants and grafts: Secondary | ICD-10-CM

## 2017-07-16 LAB — COMPREHENSIVE METABOLIC PANEL
ALBUMIN: 2.9 g/dL — AB (ref 3.5–5.0)
ALK PHOS: 605 U/L — AB (ref 40–150)
ALT: 91 U/L — ABNORMAL HIGH (ref 0–55)
ANION GAP: 11 meq/L (ref 3–11)
AST: 93 U/L — ABNORMAL HIGH (ref 5–34)
BILIRUBIN TOTAL: 0.5 mg/dL (ref 0.20–1.20)
BUN: 9.3 mg/dL (ref 7.0–26.0)
CALCIUM: 9.5 mg/dL (ref 8.4–10.4)
CO2: 25 mEq/L (ref 22–29)
Chloride: 106 mEq/L (ref 98–109)
Creatinine: 1 mg/dL (ref 0.6–1.1)
GLUCOSE: 120 mg/dL (ref 70–140)
POTASSIUM: 3.4 meq/L — AB (ref 3.5–5.1)
SODIUM: 141 meq/L (ref 136–145)
TOTAL PROTEIN: 6.9 g/dL (ref 6.4–8.3)

## 2017-07-16 LAB — CBC WITH DIFFERENTIAL/PLATELET
BASO%: 0.4 % (ref 0.0–2.0)
BASOS ABS: 0.1 10*3/uL (ref 0.0–0.1)
EOS ABS: 0.1 10*3/uL (ref 0.0–0.5)
EOS%: 1.1 % (ref 0.0–7.0)
HCT: 27.7 % — ABNORMAL LOW (ref 34.8–46.6)
HEMOGLOBIN: 8.8 g/dL — AB (ref 11.6–15.9)
LYMPH%: 10 % — AB (ref 14.0–49.7)
MCH: 23.9 pg — AB (ref 25.1–34.0)
MCHC: 31.8 g/dL (ref 31.5–36.0)
MCV: 75.1 fL — AB (ref 79.5–101.0)
MONO#: 0.7 10*3/uL (ref 0.1–0.9)
MONO%: 5.1 % (ref 0.0–14.0)
NEUT#: 11.1 10*3/uL — ABNORMAL HIGH (ref 1.5–6.5)
NEUT%: 83.4 % — ABNORMAL HIGH (ref 38.4–76.8)
PLATELETS: 97 10*3/uL — AB (ref 145–400)
RBC: 3.69 10*6/uL — ABNORMAL LOW (ref 3.70–5.45)
RDW: 17.7 % — AB (ref 11.2–14.5)
WBC: 13.2 10*3/uL — ABNORMAL HIGH (ref 3.9–10.3)
lymph#: 1.3 10*3/uL (ref 0.9–3.3)

## 2017-07-16 MED ORDER — ATROPINE SULFATE 1 MG/ML IJ SOLN
0.5000 mg | Freq: Once | INTRAMUSCULAR | Status: AC | PRN
  Administered 2017-07-16: 0.5 mg via INTRAVENOUS

## 2017-07-16 MED ORDER — PALONOSETRON HCL INJECTION 0.25 MG/5ML
0.2500 mg | Freq: Once | INTRAVENOUS | Status: AC
  Administered 2017-07-16: 0.25 mg via INTRAVENOUS

## 2017-07-16 MED ORDER — HEPARIN SOD (PORK) LOCK FLUSH 100 UNIT/ML IV SOLN
500.0000 [IU] | Freq: Once | INTRAVENOUS | Status: DC | PRN
  Filled 2017-07-16: qty 5

## 2017-07-16 MED ORDER — DEXTROSE 5 % IV SOLN
150.0000 mg/m2 | Freq: Once | INTRAVENOUS | Status: AC
  Administered 2017-07-16: 240 mg via INTRAVENOUS
  Filled 2017-07-16: qty 10

## 2017-07-16 MED ORDER — OXALIPLATIN CHEMO INJECTION 100 MG/20ML
65.0000 mg/m2 | Freq: Once | INTRAVENOUS | Status: AC
  Administered 2017-07-16: 100 mg via INTRAVENOUS
  Filled 2017-07-16: qty 20

## 2017-07-16 MED ORDER — LORAZEPAM 1 MG PO TABS
ORAL_TABLET | ORAL | Status: AC
Start: 2017-07-16 — End: 2017-07-16
  Filled 2017-07-16: qty 1

## 2017-07-16 MED ORDER — SODIUM CHLORIDE 0.9% FLUSH
10.0000 mL | Freq: Once | INTRAVENOUS | Status: AC
  Administered 2017-07-16: 10 mL
  Filled 2017-07-16: qty 10

## 2017-07-16 MED ORDER — SODIUM CHLORIDE 0.9% FLUSH
10.0000 mL | INTRAVENOUS | Status: DC | PRN
  Filled 2017-07-16: qty 10

## 2017-07-16 MED ORDER — SODIUM CHLORIDE 0.9 % IV SOLN
Freq: Once | INTRAVENOUS | Status: AC
  Administered 2017-07-16: 14:00:00 via INTRAVENOUS
  Filled 2017-07-16: qty 5

## 2017-07-16 MED ORDER — LORAZEPAM 2 MG/ML IJ SOLN
0.5000 mg | Freq: Once | INTRAMUSCULAR | Status: AC
  Administered 2017-07-16: 0.5 mg via INTRAVENOUS

## 2017-07-16 MED ORDER — DEXTROSE 5 % IV SOLN
Freq: Once | INTRAVENOUS | Status: AC
  Administered 2017-07-16: 13:00:00 via INTRAVENOUS

## 2017-07-16 MED ORDER — LEUCOVORIN CALCIUM INJECTION 350 MG
400.0000 mg/m2 | Freq: Once | INTRAMUSCULAR | Status: AC
  Administered 2017-07-16: 624 mg via INTRAVENOUS
  Filled 2017-07-16: qty 31.2

## 2017-07-16 MED ORDER — LORAZEPAM 1 MG PO TABS
0.5000 mg | ORAL_TABLET | Freq: Once | ORAL | Status: AC
  Administered 2017-07-16: 0.5 mg via ORAL

## 2017-07-16 MED ORDER — PALONOSETRON HCL INJECTION 0.25 MG/5ML
INTRAVENOUS | Status: AC
  Filled 2017-07-16: qty 5

## 2017-07-16 MED ORDER — PANTOPRAZOLE SODIUM 40 MG PO TBEC: 40.0000 mg | DELAYED_RELEASE_TABLET | Freq: Every day | ORAL | 1 refills | Status: DC

## 2017-07-16 MED ORDER — ATROPINE SULFATE 1 MG/ML IJ SOLN
INTRAMUSCULAR | Status: AC
  Filled 2017-07-16: qty 1

## 2017-07-16 MED ORDER — LORAZEPAM 0.5 MG PO TABS: 0.5000 mg | ORAL_TABLET | Freq: Three times a day (TID) | ORAL | 0 refills | Status: DC | PRN

## 2017-07-16 MED ORDER — SODIUM CHLORIDE 0.9 % IV SOLN
2400.0000 mg/m2 | INTRAVENOUS | Status: DC
  Administered 2017-07-16: 3750 mg via INTRAVENOUS
  Filled 2017-07-16: qty 75

## 2017-07-16 MED ORDER — LORAZEPAM 2 MG/ML IJ SOLN
INTRAMUSCULAR | Status: AC
  Filled 2017-07-16: qty 1

## 2017-07-16 MED FILL — PANTOPRAZOLE SOD DR 40 MG T: 40 | 30 days supply | Qty: 30 | Fill #0

## 2017-07-16 MED FILL — LORazepam 0.5 MG TABS: 0.5 | 10 days supply | Qty: 30 | Fill #0

## 2017-07-16 NOTE — Progress Notes (Signed)
1805 Patient c/o persistent anxiety. States this is per her norm. Denies CP/dyspnea. Vitals stable. Patient requesting additional Ativan. Spoke with Dr. Jana Hakim regarding same. Orders received, repeated, and confirmed. Medicated per MAR.

## 2017-07-16 NOTE — Progress Notes (Signed)
Per Dr Benay Spice ok to waste what is left of 5FU in pump after d/c on Saturday 07/18/2017.

## 2017-07-16 NOTE — Progress Notes (Addendum)
  [place] OFFICE PROGRESS NOTE   Diagnosis: Pancreas cancer  INTERVAL HISTORY:   Crystal Carlson returns as scheduled.  She completed cycle 2 FOLFIRINOX 07/02/2017.  She denies nausea/vomiting.  No mouth sores.  She estimates one loose stool a day.  No issues with cold sensitivity.  Appetite remains poor.  At times she is "afraid to eat" secondary to abdominal pain after eating.  She has no pain other than this.  Objective:  Vital signs in last 24 hours:  Blood pressure 137/88, pulse (!) 104, temperature 98.4 F (36.9 C), temperature source Oral, resp. rate (!) 24, height 5\' 4"  (1.626 m), weight 119 lb 4.8 oz (54.1 kg), SpO2 100 %.    HEENT: No thrush or ulcers. Resp: Lungs clear bilaterally. Cardio: Regular rate and rhythm. GI: Abdomen soft and nontender.  No hepatomegaly. Vascular: No leg edema.  Port-A-Cath without erythema. Lab Results:  Lab Results  Component Value Date   WBC 13.2 (H) 07/16/2017   HGB 8.8 (L) 07/16/2017   HCT 27.7 (L) 07/16/2017   MCV 75.1 (L) 07/16/2017   PLT 97 (L) 07/16/2017   NEUTROABS 11.1 (H) 07/16/2017    Imaging:  No results found.  Medications: I have reviewed the patient's current medications.  Assessment/Plan: 1. 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 Adenocarcinoma consistent with pancreatobiliary primary.  Cycle 1 FOLFIRINOX 06/18/2018  Cycle 2 FOLFIRINOX 07/02/2017  Cycle 3 FOLFIRINOX 07/16/2017 (oxaliplatin dose reduced due to mild thrombocytopenia)  2. Pain secondary to  #1.  Improved.  3. Weight losssecondary to #1.  4. Microcytic anemia  5. Mild thrombocytopenia  6. Port-A-Cath placement 06/04/2017  7. Elevated liver enzymes/bili10/22/2018-likely early biliary obstruction secondary to pancreas cancer, status post ERCP-placement of a common bile duct stent 06/09/2018  8.  Postprandial abdominal pain.  Protonix initiated 07/16/2017.   Disposition: Crystal Carlson appears unchanged.  She has completed 2 cycles of FOLFIRINOX.  Plan to proceed with cycle 3 today as scheduled.  Oxaliplatin will be dose reduced due to mild thrombocytopenia.  She understands to contact the office with any bleeding.  She will return for a follow-up visit in 1 week.  Patient seen with Dr. Benay Spice.  25 minutes were spent face-to-face at today's visit with the majority of that time involved in counseling/coordination of care.    [name] ANP/GNP-BC   07/16/2017  11:30 AM  This was a shared visit with [name].  Crystal Carlson was interviewed and examined.  Her overall performance status has improved following 2 cycles of FOLFIRINOX.  We will dose reduce the oxaliplatin secondary to thrombocytopenia.  She will complete another treatment with FOLFIRINOX today.  She will return for an office visit in 1 week.  She will try Protonix for the postprandial abdominal pain.  [name], MD

## 2017-07-16 NOTE — Patient Instructions (Signed)
[facility] Discharge Instructions for Patients Receiving Chemotherapy  Today you received the following chemotherapy agents Oxaliplatin, Irinotecan, Leucovorin, and 5FU  To help prevent nausea and vomiting after your treatment, we encourage you to take your nausea medication as directed   If you develop nausea and vomiting that is not controlled by your nausea medication, call the clinic.   BELOW ARE SYMPTOMS THAT SHOULD BE REPORTED IMMEDIATELY:  *FEVER GREATER THAN 100.5 F  *CHILLS WITH OR WITHOUT FEVER  NAUSEA AND VOMITING THAT IS NOT CONTROLLED WITH YOUR NAUSEA MEDICATION  *UNUSUAL SHORTNESS OF BREATH  *UNUSUAL BRUISING OR BLEEDING  TENDERNESS IN MOUTH AND THROAT WITH OR WITHOUT PRESENCE OF ULCERS  *URINARY PROBLEMS  *BOWEL PROBLEMS  UNUSUAL RASH Items with * indicate a potential emergency and should be followed up as soon as possible.  Feel free to call the clinic should you have any questions or concerns. The clinic phone number is [phone].  Please show the CHEMO ALERT CARD at check-in to the Emergency Department and triage nurse.   

## 2017-07-16 NOTE — Progress Notes (Signed)
Per [employer] desk RN for [name] NP ok to tx with AST 93 and ALT 91, and Platelets addressed in Lisa's note with dose reduction of Oxalipaltin.   @1442  pt stated she "felt hot". VS as charted in flowsheet, normal temperature. Pt asked if she "could have something for anxiety".  Notified Runner, broadcasting/film/video, order for Ativan ordered and administered according to [facility].

## 2017-07-17 ENCOUNTER — Telehealth: Payer: Self-pay | Admitting: Nurse Practitioner

## 2017-07-17 NOTE — Telephone Encounter (Signed)
Unable to schedule for 11/29 los - due to capped day - email sane tot Jenny Reichmann for approval . To contact patient when appt is scheduled.

## 2017-07-18 ENCOUNTER — Ambulatory Visit (HOSPITAL_BASED_OUTPATIENT_CLINIC_OR_DEPARTMENT_OTHER): Payer: Medicaid Other

## 2017-07-18 VITALS — BP 111/82 | HR 62 | Temp 98.0°F | Resp 19

## 2017-07-18 DIAGNOSIS — C787 Secondary malignant neoplasm of liver and intrahepatic bile duct: Principal | ICD-10-CM

## 2017-07-18 DIAGNOSIS — C257 Malignant neoplasm of other parts of pancreas: Secondary | ICD-10-CM

## 2017-07-18 DIAGNOSIS — Z5189 Encounter for other specified aftercare: Secondary | ICD-10-CM

## 2017-07-18 DIAGNOSIS — C259 Malignant neoplasm of pancreas, unspecified: Secondary | ICD-10-CM

## 2017-07-18 MED ORDER — SODIUM CHLORIDE 0.9% FLUSH
10.0000 mL | INTRAVENOUS | Status: DC | PRN
  Administered 2017-07-18: 10 mL
  Filled 2017-07-18: qty 10

## 2017-07-18 MED ORDER — HEPARIN SOD (PORK) LOCK FLUSH 100 UNIT/ML IV SOLN
500.0000 [IU] | Freq: Once | INTRAVENOUS | Status: AC | PRN
  Administered 2017-07-18: 500 [IU]
  Filled 2017-07-18: qty 5

## 2017-07-18 MED ORDER — PEGFILGRASTIM INJECTION 6 MG/0.6ML ~~LOC~~
6.0000 mg | PREFILLED_SYRINGE | Freq: Once | SUBCUTANEOUS | Status: AC
  Administered 2017-07-18: 6 mg via SUBCUTANEOUS

## 2017-07-22 ENCOUNTER — Telehealth: Payer: Self-pay

## 2017-07-22 NOTE — Telephone Encounter (Signed)
Left a detailed voice message for changes in 12/13 appointment time. Per increase in infusion time. Amened on 12/5

## 2017-07-23 ENCOUNTER — Other Ambulatory Visit: Payer: Self-pay | Admitting: Nurse Practitioner

## 2017-07-23 ENCOUNTER — Encounter: Payer: Self-pay | Admitting: Nurse Practitioner

## 2017-07-23 ENCOUNTER — Ambulatory Visit (HOSPITAL_BASED_OUTPATIENT_CLINIC_OR_DEPARTMENT_OTHER): Payer: Medicaid Other | Admitting: Nurse Practitioner

## 2017-07-23 ENCOUNTER — Other Ambulatory Visit (HOSPITAL_BASED_OUTPATIENT_CLINIC_OR_DEPARTMENT_OTHER): Payer: Medicaid Other | Admitting: Lab

## 2017-07-23 ENCOUNTER — Other Ambulatory Visit: Payer: Self-pay | Admitting: *Deleted

## 2017-07-23 ENCOUNTER — Other Ambulatory Visit (HOSPITAL_BASED_OUTPATIENT_CLINIC_OR_DEPARTMENT_OTHER): Payer: Medicaid Other

## 2017-07-23 VITALS — BP 129/89 | HR 121 | Temp 100.1°F | Resp 22 | Ht 64.0 in | Wt 118.7 lb

## 2017-07-23 DIAGNOSIS — C259 Malignant neoplasm of pancreas, unspecified: Secondary | ICD-10-CM

## 2017-07-23 DIAGNOSIS — R509 Fever, unspecified: Secondary | ICD-10-CM | POA: Diagnosis not present

## 2017-07-23 DIAGNOSIS — C787 Secondary malignant neoplasm of liver and intrahepatic bile duct: Secondary | ICD-10-CM

## 2017-07-23 DIAGNOSIS — C257 Malignant neoplasm of other parts of pancreas: Secondary | ICD-10-CM

## 2017-07-23 DIAGNOSIS — Z95828 Presence of other vascular implants and grafts: Secondary | ICD-10-CM

## 2017-07-23 LAB — COMPREHENSIVE METABOLIC PANEL
ALT: 106 U/L — AB (ref 0–55)
ANION GAP: 15 meq/L — AB (ref 3–11)
AST: 57 U/L — ABNORMAL HIGH (ref 5–34)
Albumin: 2.9 g/dL — ABNORMAL LOW (ref 3.5–5.0)
Alkaline Phosphatase: 521 U/L — ABNORMAL HIGH (ref 40–150)
BILIRUBIN TOTAL: 0.81 mg/dL (ref 0.20–1.20)
BUN: 10.4 mg/dL (ref 7.0–26.0)
CO2: 20 meq/L — AB (ref 22–29)
CREATININE: 1 mg/dL (ref 0.6–1.1)
Calcium: 8.8 mg/dL (ref 8.4–10.4)
Chloride: 99 mEq/L (ref 98–109)
EGFR: >60 mL/min/{1.73_m2} (ref >60)
GLUCOSE: 167 mg/dL — AB (ref 70–140)
Potassium: 3.6 mEq/L (ref 3.5–5.1)
Sodium: 134 mEq/L — ABNORMAL LOW (ref 136–145)
TOTAL PROTEIN: 6.8 g/dL (ref 6.4–8.3)

## 2017-07-23 LAB — CBC WITH DIFFERENTIAL/PLATELET
BASO%: 0.4 % (ref 0.0–2.0)
Basophils Absolute: 0 10*3/uL (ref 0.0–0.1)
EOS%: 0 % (ref 0.0–7.0)
Eosinophils Absolute: 0 10*3/uL (ref 0.0–0.5)
HCT: 26 % — ABNORMAL LOW (ref 34.8–46.6)
HGB: 8.3 g/dL — ABNORMAL LOW (ref 11.6–15.9)
LYMPH%: 18.9 % (ref 14.0–49.7)
MCH: 24.5 pg — AB (ref 25.1–34.0)
MCHC: 31.9 g/dL (ref 31.5–36.0)
MCV: 76.7 fL — AB (ref 79.5–101.0)
MONO#: 0.3 10*3/uL (ref 0.1–0.9)
MONO%: 10.7 % (ref 0.0–14.0)
NEUT#: 1.7 10*3/uL (ref 1.5–6.5)
NEUT%: 70 % (ref 38.4–76.8)
PLATELETS: 95 10*3/uL — AB (ref 145–400)
RBC: 3.39 10*6/uL — AB (ref 3.70–5.45)
RDW: 18.1 % — ABNORMAL HIGH (ref 11.2–14.5)
WBC: 2.4 10*3/uL — ABNORMAL LOW (ref 3.9–10.3)
lymph#: 0.5 10*3/uL — ABNORMAL LOW (ref 0.9–3.3)

## 2017-07-23 MED ORDER — ACETAMINOPHEN 325 MG PO TABS
ORAL_TABLET | ORAL | Status: AC
  Filled 2017-07-23: qty 2

## 2017-07-23 MED ORDER — ACETAMINOPHEN 325 MG PO TABS
650.0000 mg | ORAL_TABLET | Freq: Once | ORAL | Status: AC
  Administered 2017-07-23: 650 mg via ORAL

## 2017-07-23 MED ORDER — HEPARIN SOD (PORK) LOCK FLUSH 100 UNIT/ML IV SOLN
500.0000 [IU] | Freq: Once | INTRAVENOUS | Status: AC
  Administered 2017-07-23: 500 [IU]
  Filled 2017-07-23: qty 5

## 2017-07-23 MED ORDER — SODIUM CHLORIDE 0.9% FLUSH
10.0000 mL | Freq: Once | INTRAVENOUS | Status: AC
  Administered 2017-07-23: 10 mL
  Filled 2017-07-23: qty 10

## 2017-07-23 MED ORDER — CIPROFLOXACIN HCL 500 MG PO TABS: 500.0000 mg | ORAL_TABLET | Freq: Two times a day (BID) | ORAL | 0 refills | Status: DC

## 2017-07-23 MED ORDER — DEXTROSE 5 % IV SOLN
2.0000 g | Freq: Once | INTRAVENOUS | Status: AC
  Administered 2017-07-23: 2 g via INTRAVENOUS
  Filled 2017-07-23: qty 2

## 2017-07-23 MED FILL — CIPROFLOXACIN HCL 500 MG TA: 500 | 5 days supply | Qty: 10 | Fill #0

## 2017-07-23 NOTE — Progress Notes (Addendum)
Dr. Benay Spice informed of elevated respirations heart rate and temp. Temperature down to 100.1 post Cefepime infusion. MD in to see pt, OK to discharge home. Instructions given to report to ED if pt's condition worsens overnight (confusion, weakness, high fever). Pt's husband picked up Cipro. Pt took first dose at 1800. Instructed him to administer next dose in the AM. Appt given to return to clinic at [facility] 12/7. Both voiced understanding.

## 2017-07-23 NOTE — Addendum Note (Signed)
Addended by: Brien Few on: 07/23/2017 06:27 PM   Modules accepted: Orders

## 2017-07-23 NOTE — Progress Notes (Addendum)
[place] OFFICE PROGRESS NOTE   Diagnosis: Pancreas cancer  INTERVAL HISTORY:   Crystal Carlson returns as scheduled.  She completed cycle 3 FOLFIRINOX 07/16/2017.  She denies nausea/vomiting.  No mouth sores.  She had a few loose stools after the chemotherapy, none since.  She continues to have postprandial abdominal pain.  She began feeling "cold" around 11:00 today.  No shaking chills.  No fever at home.  She denies shortness of breath and cough.  No urinary symptoms.  No pain associated with the Port-A-Cath.  No sore throat.  Objective:  Vital signs in last 24 hours:  Blood pressure (!) 144/98, pulse 100, temperature (!) 102.6 F (39.2 C), temperature source Oral, resp. rate 16, height 5\' 4"  (1.626 m), weight 118 lb 11.2 oz (53.8 kg), SpO2 100 %.    HEENT: No thrush or ulcers.  Sclera anicteric. Resp: Lungs clear bilaterally. Cardio: Regular rate and rhythm. GI: Abdomen is soft with mild diffuse tenderness.  No hepatomegaly. Vascular: No leg edema.  Skin: Warm. Port-A-Cath without erythema.   Lab Results:  Lab Results  Component Value Date   WBC 2.4 (L) 07/23/2017   HGB 8.3 (L) 07/23/2017   HCT 26.0 (L) 07/23/2017   MCV 76.7 (L) 07/23/2017   PLT 95 (L) 07/23/2017   NEUTROABS 1.7 07/23/2017    Imaging:  No results found.  Medications: I have reviewed the patient's current medications.  Assessment/Plan: 1. 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 Adenocarcinoma consistent with pancreatobiliary primary.  Cycle 1  FOLFIRINOX 06/18/2018  Cycle 2 FOLFIRINOX 07/02/2017  Cycle 3 FOLFIRINOX 07/16/2017 (oxaliplatin dose reduced due to mild thrombocytopenia)  2. Pain secondary to #1.  Improved.  3. Weight losssecondary to #1.  4. Microcytic anemia  5. Mild thrombocytopenia  6. Port-A-Cath placement 06/04/2017  7. Elevated liver enzymes/bilirubin10/22/2018-likely early biliary obstruction secondary to pancreas cancer, status post ERCP-placement of a common bile duct stent 06/09/2018  8.  Postprandial abdominal pain.  Protonix initiated 07/16/2017.  9.  Fever, source unclear 07/23/2017.    Disposition: Crystal Carlson appears unchanged.  She completed cycle 3 FOLFIRINOX 07/16/2017 with Neulasta support.  She unexpectedly had a fever upon arrival to the office today.  She is not hypotensive.  We reviewed today's labs.  She is not neutropenic.  The bilirubin is in normal range.  Other LFTs with stable elevation.  There is no localizing source for infection by history or exam.  She does not appear acutely ill.  We are obtaining a blood culture from the Port-A-Cath, urinalysis and urine culture.  She will receive a dose of cefepime 2 g IV x1 in the office and begin Cipro 500 mg twice daily at home.  She will return for a follow-up visit with repeat labs 07/24/2017 a.m.  She and her husband understand to proceed to the emergency department with any change in her condition overnight.  Patient seen with Dr. Benay Spice.    [name] ANP/GNP-BC   07/23/2017  3:31 PM  This was a shared visit with [name].  Crystal Carlson viewed and examined.  She has new onset fever and chills today.  There is no apparent  source for infection upon review of her history and examination, but the liver enzymes remain elevated and there is a biliary stent in place.  I discussed the case with gastroenterology.  She will be placed on antibiotics and we obtained cultures of the blood and urine.  She will return for an office  visit on 07/24/2017.  She will go to the emergency room for a persistent fever or new symptoms tonight.  Her neutrophil count and blood pressure are adequate.  She is alert and appears stable for discharge to home.  [name], MD

## 2017-07-24 ENCOUNTER — Ambulatory Visit (HOSPITAL_BASED_OUTPATIENT_CLINIC_OR_DEPARTMENT_OTHER): Payer: Medicaid Other | Admitting: Oncology

## 2017-07-24 ENCOUNTER — Inpatient Hospital Stay (HOSPITAL_COMMUNITY): Payer: Medicaid Other

## 2017-07-24 ENCOUNTER — Ambulatory Visit (HOSPITAL_BASED_OUTPATIENT_CLINIC_OR_DEPARTMENT_OTHER): Payer: Medicaid Other

## 2017-07-24 ENCOUNTER — Encounter: Payer: Self-pay | Admitting: Oncology

## 2017-07-24 ENCOUNTER — Inpatient Hospital Stay (HOSPITAL_COMMUNITY)
Admission: AD | Admit: 2017-07-24 | Discharge: 2017-07-27 | DRG: 809 | Disposition: A | Discharge status: Home / Self Care | Payer: Medicaid Other | Source: Ambulatory Visit | Attending: Internal Medicine | Admitting: Internal Medicine

## 2017-07-24 ENCOUNTER — Encounter (HOSPITAL_COMMUNITY): Payer: Self-pay | Admitting: Radiology

## 2017-07-24 ENCOUNTER — Ambulatory Visit: Payer: Medicaid Other

## 2017-07-24 VITALS — BP 112/86 | HR 125 | Temp 98.7°F | Resp 22

## 2017-07-24 VITALS — BP 124/75 | HR 100 | Temp 100.8°F | Resp 19

## 2017-07-24 DIAGNOSIS — C787 Secondary malignant neoplasm of liver and intrahepatic bile duct: Secondary | ICD-10-CM | POA: Diagnosis present

## 2017-07-24 DIAGNOSIS — C259 Malignant neoplasm of pancreas, unspecified: Secondary | ICD-10-CM | POA: Diagnosis present

## 2017-07-24 DIAGNOSIS — R5081 Fever presenting with conditions classified elsewhere: Secondary | ICD-10-CM | POA: Diagnosis present

## 2017-07-24 DIAGNOSIS — D709 Neutropenia, unspecified: Principal | ICD-10-CM | POA: Diagnosis present

## 2017-07-24 DIAGNOSIS — D701 Agranulocytosis secondary to cancer chemotherapy: Secondary | ICD-10-CM | POA: Diagnosis not present

## 2017-07-24 DIAGNOSIS — C257 Malignant neoplasm of other parts of pancreas: Secondary | ICD-10-CM

## 2017-07-24 DIAGNOSIS — D6959 Other secondary thrombocytopenia: Secondary | ICD-10-CM | POA: Diagnosis present

## 2017-07-24 DIAGNOSIS — Z86718 Personal history of other venous thrombosis and embolism: Secondary | ICD-10-CM | POA: Diagnosis not present

## 2017-07-24 DIAGNOSIS — Z95828 Presence of other vascular implants and grafts: Secondary | ICD-10-CM

## 2017-07-24 DIAGNOSIS — G893 Neoplasm related pain (acute) (chronic): Secondary | ICD-10-CM | POA: Diagnosis present

## 2017-07-24 DIAGNOSIS — D509 Iron deficiency anemia, unspecified: Secondary | ICD-10-CM | POA: Diagnosis present

## 2017-07-24 DIAGNOSIS — R197 Diarrhea, unspecified: Secondary | ICD-10-CM | POA: Diagnosis not present

## 2017-07-24 DIAGNOSIS — R509 Fever, unspecified: Secondary | ICD-10-CM | POA: Diagnosis present

## 2017-07-24 DIAGNOSIS — K521 Toxic gastroenteritis and colitis: Secondary | ICD-10-CM | POA: Diagnosis not present

## 2017-07-24 DIAGNOSIS — D759 Disease of blood and blood-forming organs, unspecified: Secondary | ICD-10-CM | POA: Diagnosis not present

## 2017-07-24 DIAGNOSIS — C786 Secondary malignant neoplasm of retroperitoneum and peritoneum: Secondary | ICD-10-CM | POA: Diagnosis present

## 2017-07-24 DIAGNOSIS — Z8261 Family history of arthritis: Secondary | ICD-10-CM

## 2017-07-24 DIAGNOSIS — Z8249 Family history of ischemic heart disease and other diseases of the circulatory system: Secondary | ICD-10-CM | POA: Diagnosis not present

## 2017-07-24 DIAGNOSIS — T451X5A Adverse effect of antineoplastic and immunosuppressive drugs, initial encounter: Secondary | ICD-10-CM | POA: Diagnosis present

## 2017-07-24 DIAGNOSIS — E440 Moderate protein-calorie malnutrition: Secondary | ICD-10-CM | POA: Diagnosis present

## 2017-07-24 DIAGNOSIS — I10 Essential (primary) hypertension: Secondary | ICD-10-CM | POA: Diagnosis present

## 2017-07-24 DIAGNOSIS — E860 Dehydration: Secondary | ICD-10-CM

## 2017-07-24 DIAGNOSIS — R109 Unspecified abdominal pain: Secondary | ICD-10-CM | POA: Diagnosis not present

## 2017-07-24 DIAGNOSIS — D61810 Antineoplastic chemotherapy induced pancytopenia: Secondary | ICD-10-CM

## 2017-07-24 DIAGNOSIS — D696 Thrombocytopenia, unspecified: Secondary | ICD-10-CM | POA: Diagnosis not present

## 2017-07-24 DIAGNOSIS — Z833 Family history of diabetes mellitus: Secondary | ICD-10-CM | POA: Diagnosis not present

## 2017-07-24 DIAGNOSIS — E876 Hypokalemia: Secondary | ICD-10-CM | POA: Diagnosis present

## 2017-07-24 DIAGNOSIS — N1330 Unspecified hydronephrosis: Secondary | ICD-10-CM | POA: Diagnosis present

## 2017-07-24 DIAGNOSIS — D6481 Anemia due to antineoplastic chemotherapy: Secondary | ICD-10-CM | POA: Diagnosis not present

## 2017-07-24 LAB — MAGNESIUM: Magnesium: 1.4 mg/dL — ABNORMAL LOW (ref 1.7–2.4)

## 2017-07-24 LAB — CBC WITH DIFFERENTIAL/PLATELET
BASO%: 0.2 % (ref 0.0–2.0)
Basophils Absolute: 0 10*3/uL (ref 0.0–0.1)
EOS%: 0.2 % (ref 0.0–7.0)
Eosinophils Absolute: 0 10*3/uL (ref 0.0–0.5)
HEMATOCRIT: 25.3 % — AB (ref 34.8–46.6)
HEMOGLOBIN: 8.2 g/dL — AB (ref 11.6–15.9)
LYMPH#: 0.2 10*3/uL — AB (ref 0.9–3.3)
LYMPH%: 7.6 % — ABNORMAL LOW (ref 14.0–49.7)
MCH: 24.3 pg — ABNORMAL LOW (ref 25.1–34.0)
MCHC: 32.6 g/dL (ref 31.5–36.0)
MCV: 74.4 fL — ABNORMAL LOW (ref 79.5–101.0)
MONO#: 0.2 10*3/uL (ref 0.1–0.9)
MONO%: 10.6 % (ref 0.0–14.0)
NEUT%: 81.4 % — AB (ref 38.4–76.8)
NEUTROS ABS: 1.8 10*3/uL (ref 1.5–6.5)
PLATELETS: 96 10*3/uL — AB (ref 145–400)
RBC: 3.4 10*6/uL — ABNORMAL LOW (ref 3.70–5.45)
RDW: 17.6 % — AB (ref 11.2–14.5)
WBC: 2.2 10*3/uL — AB (ref 3.9–10.3)

## 2017-07-24 LAB — URINALYSIS, MICROSCOPIC - CHCC
BILIRUBIN (URINE): NEGATIVE
Glucose: NEGATIVE mg/dL
KETONES: NEGATIVE mg/dL
LEUKOCYTE ESTERASE: NEGATIVE
NITRITE: NEGATIVE
PH: 6.5 (ref 4.6–8.0)
Protein: 30 mg/dL
SPECIFIC GRAVITY, URINE: 1.02 (ref 1.003–1.035)
Urobilinogen, UR: 0.2 mg/dL (ref 0.2–1)

## 2017-07-24 LAB — COMPREHENSIVE METABOLIC PANEL
ALBUMIN: 2.9 g/dL — AB (ref 3.5–5.0)
ALT: 92 U/L — AB (ref 0–55)
ANION GAP: 14 meq/L — AB (ref 3–11)
AST: 43 U/L — ABNORMAL HIGH (ref 5–34)
Alkaline Phosphatase: 494 U/L — ABNORMAL HIGH (ref 40–150)
BILIRUBIN TOTAL: 0.96 mg/dL (ref 0.20–1.20)
BUN: 12 mg/dL (ref 7.0–26.0)
CALCIUM: 8.8 mg/dL (ref 8.4–10.4)
CO2: 21 mEq/L — ABNORMAL LOW (ref 22–29)
CREATININE: 1 mg/dL (ref 0.6–1.1)
Chloride: 99 mEq/L (ref 98–109)
EGFR: >60 mL/min/{1.73_m2} (ref >60)
Glucose: 147 mg/dl — ABNORMAL HIGH (ref 70–140)
Potassium: 2.8 mEq/L — CL (ref 3.5–5.1)
Sodium: 133 mEq/L — ABNORMAL LOW (ref 136–145)
TOTAL PROTEIN: 7.1 g/dL (ref 6.4–8.3)

## 2017-07-24 MED ORDER — CIPROFLOXACIN HCL 500 MG PO TABS
500.0000 mg | ORAL_TABLET | Freq: Once | ORAL | Status: AC
  Administered 2017-07-24: 500 mg via ORAL
  Filled 2017-07-24: qty 1

## 2017-07-24 MED ORDER — PANTOPRAZOLE SODIUM 40 MG PO TBEC
40.0000 mg | DELAYED_RELEASE_TABLET | Freq: Every day | ORAL | Status: DC
  Administered 2017-07-24 – 2017-07-27 (×4): 40 mg via ORAL
  Filled 2017-07-24 (×4): qty 1

## 2017-07-24 MED ORDER — POTASSIUM CHLORIDE CRYS ER 20 MEQ PO TBCR
40.0000 meq | EXTENDED_RELEASE_TABLET | Freq: Every day | ORAL | Status: DC
  Administered 2017-07-24: 40 meq via ORAL
  Filled 2017-07-24: qty 2

## 2017-07-24 MED ORDER — SODIUM CHLORIDE 0.9% FLUSH
10.0000 mL | Freq: Once | INTRAVENOUS | Status: AC
  Administered 2017-07-24: 10 mL
  Filled 2017-07-24: qty 10

## 2017-07-24 MED ORDER — KCL IN DEXTROSE-NACL 20-5-0.9 MEQ/L-%-% IV SOLN
INTRAVENOUS | Status: DC
  Administered 2017-07-24: 20:00:00 via INTRAVENOUS
  Filled 2017-07-24 (×4): qty 1000

## 2017-07-24 MED ORDER — FERROUS SULFATE 325 (65 FE) MG PO TABS
325.0000 mg | ORAL_TABLET | Freq: Three times a day (TID) | ORAL | Status: DC
  Administered 2017-07-25 – 2017-07-27 (×7): 325 mg via ORAL
  Filled 2017-07-24 (×8): qty 1

## 2017-07-24 MED ORDER — SODIUM CHLORIDE 0.9 % IV SOLN
Freq: Once | INTRAVENOUS | Status: AC
  Administered 2017-07-24: 10:00:00 via INTRAVENOUS

## 2017-07-24 MED ORDER — MAGNESIUM SULFATE 2 GM/50ML IV SOLN
2.0000 g | INTRAVENOUS | Status: AC
  Administered 2017-07-24 (×2): 2 g via INTRAVENOUS
  Filled 2017-07-24 (×2): qty 50

## 2017-07-24 MED ORDER — LORAZEPAM 0.5 MG PO TABS
0.5000 mg | ORAL_TABLET | Freq: Three times a day (TID) | ORAL | Status: DC | PRN
  Administered 2017-07-24 – 2017-07-26 (×4): 0.5 mg via ORAL
  Filled 2017-07-24 (×4): qty 1

## 2017-07-24 MED ORDER — VANCOMYCIN HCL IN DEXTROSE 1-5 GM/200ML-% IV SOLN
1000.0000 mg | Freq: Once | INTRAVENOUS | Status: AC
  Administered 2017-07-24: 1000 mg via INTRAVENOUS
  Filled 2017-07-24: qty 200

## 2017-07-24 MED ORDER — HYDROMORPHONE HCL 4 MG PO TABS
2.0000 mg | ORAL_TABLET | ORAL | Status: DC | PRN
  Administered 2017-07-25 – 2017-07-27 (×2): 2 mg via ORAL
  Filled 2017-07-24 (×2): qty 1

## 2017-07-24 MED ORDER — POTASSIUM CHLORIDE CRYS ER 20 MEQ PO TBCR: 20.0000 meq | EXTENDED_RELEASE_TABLET | Freq: Every day | ORAL | 0 refills | Status: DC

## 2017-07-24 MED ORDER — DEXTROSE 5 % IV SOLN
2.0000 g | Freq: Once | INTRAVENOUS | Status: AC
  Administered 2017-07-24: 2 g via INTRAVENOUS
  Filled 2017-07-24 (×2): qty 2

## 2017-07-24 MED ORDER — ENOXAPARIN SODIUM 40 MG/0.4ML ~~LOC~~ SOLN
40.0000 mg | SUBCUTANEOUS | Status: DC
  Administered 2017-07-24: 40 mg via SUBCUTANEOUS
  Filled 2017-07-24: qty 0.4

## 2017-07-24 MED ORDER — IOPAMIDOL (ISOVUE-300) INJECTION 61%
INTRAVENOUS | Status: AC
  Filled 2017-07-24: qty 30

## 2017-07-24 MED ORDER — VANCOMYCIN HCL IN DEXTROSE 750-5 MG/150ML-% IV SOLN
750.0000 mg | INTRAVENOUS | Status: DC
  Administered 2017-07-25 – 2017-07-26 (×2): 750 mg via INTRAVENOUS
  Filled 2017-07-24 (×2): qty 150

## 2017-07-24 MED ORDER — SODIUM CHLORIDE 0.9 % IV SOLN
INTRAVENOUS | Status: DC
  Administered 2017-07-24: 11:00:00 via INTRAVENOUS
  Filled 2017-07-24 (×2): qty 1000

## 2017-07-24 MED ORDER — IOPAMIDOL (ISOVUE-300) INJECTION 61%
INTRAVENOUS | Status: AC
  Administered 2017-07-24: 75 mL via INTRAVENOUS
  Filled 2017-07-24: qty 75

## 2017-07-24 MED ORDER — SODIUM CHLORIDE 0.9% FLUSH
3.0000 mL | Freq: Two times a day (BID) | INTRAVENOUS | Status: DC
  Administered 2017-07-24 – 2017-07-26 (×3): 3 mL via INTRAVENOUS

## 2017-07-24 MED ORDER — DEXTROSE 5 % IV SOLN
2.0000 g | Freq: Three times a day (TID) | INTRAVENOUS | Status: DC
  Administered 2017-07-25 – 2017-07-27 (×7): 2 g via INTRAVENOUS
  Filled 2017-07-24 (×8): qty 2

## 2017-07-24 MED ORDER — LOPERAMIDE HCL 2 MG PO CAPS
ORAL_CAPSULE | ORAL | Status: AC
  Filled 2017-07-24: qty 2

## 2017-07-24 MED ORDER — LOPERAMIDE HCL 2 MG PO CAPS
4.0000 mg | ORAL_CAPSULE | Freq: Once | ORAL | Status: AC
  Administered 2017-07-24: 4 mg via ORAL

## 2017-07-24 MED ORDER — IOPAMIDOL (ISOVUE-300) INJECTION 61%
30.0000 mL | Freq: Once | INTRAVENOUS | Status: AC
  Administered 2017-07-24: 30 mL via ORAL

## 2017-07-24 MED FILL — POTASSIUM CL ER 20 MEQ TAB: 20 | 30 days supply | Qty: 30 | Fill #0

## 2017-07-24 NOTE — Patient Instructions (Signed)
Implanted Port Home Guide An implanted port is a type of central line that is placed under the skin. Central lines are used to provide IV access when treatment or nutrition needs to be given through a person's veins. Implanted ports are used for long-term IV access. An implanted port may be placed because:  You need IV medicine that would be irritating to the small veins in your hands or arms.  You need long-term IV medicines, such as antibiotics.  You need IV nutrition for a long period.  You need frequent blood draws for lab tests.  You need dialysis.  Implanted ports are usually placed in the chest area, but they can also be placed in the upper arm, the abdomen, or the leg. An implanted port has two main parts:  Reservoir. The reservoir is round and will appear as a small, raised area under your skin. The reservoir is the part where a needle is inserted to give medicines or draw blood.  Catheter. The catheter is a thin, flexible tube that extends from the reservoir. The catheter is placed into a large vein. Medicine that is inserted into the reservoir goes into the catheter and then into the vein.  How will I care for my incision site? Do not get the incision site wet. Bathe or shower as directed by your health care provider. How is my port accessed? Special steps must be taken to access the port:  Before the port is accessed, a numbing cream can be placed on the skin. This helps numb the skin over the port site.  Your health care provider uses a sterile technique to access the port. ? Your health care provider must put on a mask and sterile gloves. ? The skin over your port is cleaned carefully with an antiseptic and allowed to dry. ? The port is gently pinched between sterile gloves, and a needle is inserted into the port.  Only "non-coring" port needles should be used to access the port. Once the port is accessed, a blood return should be checked. This helps ensure that the port  is in the vein and is not clogged.  If your port needs to remain accessed for a constant infusion, a clear (transparent) bandage will be placed over the needle site. The bandage and needle will need to be changed every week, or as directed by your health care provider.  Keep the bandage covering the needle clean and dry. Do not get it wet. Follow your health care provider's instructions on how to take a shower or bath while the port is accessed.  If your port does not need to stay accessed, no bandage is needed over the port.  What is flushing? Flushing helps keep the port from getting clogged. Follow your health care provider's instructions on how and when to flush the port. Ports are usually flushed with saline solution or a medicine called heparin. The need for flushing will depend on how the port is used.  If the port is used for intermittent medicines or blood draws, the port will need to be flushed: ? After medicines have been given. ? After blood has been drawn. ? As part of routine maintenance.  If a constant infusion is running, the port may not need to be flushed.  How long will my port stay implanted? The port can stay in for as long as your health care provider thinks it is needed. When it is time for the port to come out, surgery will be   done to remove it. The procedure is similar to the one performed when the port was put in. When should I seek immediate medical care? When you have an implanted port, you should seek immediate medical care if:  You notice a bad smell coming from the incision site.  You have swelling, redness, or drainage at the incision site.  You have more swelling or pain at the port site or the surrounding area.  You have a fever that is not controlled with medicine.  This information is not intended to replace advice given to you by your health care provider. Make sure you discuss any questions you have with your health care provider. Document  Released: 08/04/2005 Document Revised: 01/10/2016 Document Reviewed: 04/11/2013 Elsevier Interactive Patient Education  2017 [employer]  

## 2017-07-24 NOTE — Progress Notes (Signed)
[place] OFFICE PROGRESS NOTE  Mack Hook, MD [address] [place] [address]  DIAGNOSIS: Pancreatic cancer  CURRENT THERAPY: FOLFIRINOX status post 3 cycles.  Last cycle was given on 07/16/2017 (oxaliplatin dose reduced due to mild thrombocytopenia)  INTERVAL HISTORY: Crystal Carlson 51 y.o. female returns for follow-up visit accompanied by her husband.  Patient was seen yesterday for routine follow-up was noted to have a fever.  She was given a dose of IV antibiotics here in the office and started on oral Cipro.  Patient's husband tells me that she has been sleeping most of the night and had a fever of 101.6 approximately 2:30 AM.  He gave her Tylenol and her fever came down.  She did not have any shaking chills but did complain of being cold at the time.  He also reports that she had a fever of 104.2 at 720 this morning and he did not give any Tylenol since he was trying to get her here to the office.  The patient denies chest pain, shortness of breath, cough, hemoptysis.  Denies nausea and vomiting.  She does report some mild abdominal discomfort and intermittent diarrhea.  Has not taken anything for pain or diarrhea today.  Denies bleeding.  No pain associated with the Port-A-Cath.  No sore throat.  The patient is here for reevaluation and blood work.  MEDICAL HISTORY: Past Medical History:  Diagnosis Date  . Cancer of pancreas ([place]) 05/26/2017  . Chronic pancreatitis ([place]) 05/19/2017  . Microcytic anemia 05/19/2017  . Prediabetes 2016   when living in [place]:  has No Known Allergies.  MEDICATIONS:  Current Outpatient Medications  Medication Sig Dispense Refill  . ciprofloxacin (CIPRO) 500 MG tablet Take 1 tablet (500 mg total) by mouth 2 (two) times daily. 10 tablet 0  . dexamethasone (DECADRON) 4 MG tablet Take 1 tablet twice a day for 3 days after each cycle of chemo. 30 tablet 1  . docusate sodium (COLACE) 100 MG capsule Take 1 capsule  (100 mg total) by mouth 2 (two) times daily. 10 capsule 0  . ferrous sulfate 325 (65 FE) MG EC tablet Take 1 tablet (325 mg total) by mouth 3 (three) times daily with meals. 90 tablet 3  . gabapentin (NEURONTIN) 300 MG capsule Take 300 mg by mouth at bedtime.     Marland Kitchen HYDROmorphone (DILAUDID) 4 MG tablet Take 1-2 tablets (4-8 mg total) every 4 (four) hours as needed by mouth for severe pain. 60 tablet 0  . lidocaine-prilocaine (EMLA) cream Apply to port site one hour prior to use. Do not rub in. Cover with plastic. (Patient taking differently: Apply 1 application daily as needed topically. Apply to port site one hour prior to use. Do not rub in. Cover with plastic.) 30 g 1  . LORazepam (ATIVAN) 0.5 MG tablet Take 1 tablet (0.5 mg total) by mouth every 8 (eight) hours as needed for anxiety (or nausea). 30 tablet 0  . morphine (MS CONTIN) 15 MG 12 hr tablet Take 1 tablet (15 mg total) every 12 (twelve) hours by mouth. 60 tablet 0  . pantoprazole (PROTONIX) 40 MG tablet Take 1 tablet (40 mg total) by mouth daily. 30 tablet 1  . polyethylene glycol (MIRALAX / GLYCOLAX) packet Take 17 g by mouth daily. 14 each 0  . potassium chloride SA (K-DUR,KLOR-CON) 20 MEQ tablet Take 1 tablet (20 mEq total) by mouth daily. 30 tablet 2  . prochlorperazine (COMPAZINE) 10 MG  tablet Take 1 tablet (10 mg total) by mouth every 6 (six) hours as needed for nausea or vomiting. (Patient not taking: Reported on 07/23/2017) 30 tablet 2  . sorbitol 70 % solution Take 30 mLs by mouth 2 (two) times daily. 473 mL 0   Current Facility-Administered Medications  Medication Dose Route Frequency Provider Last Rate Last Dose  . sodium chloride 0.9 % 1,000 mL with potassium chloride 40 mEq infusion   Intravenous Continuous Eamon Tantillo, Roselie Awkward, NP        SURGICAL HISTORY:  Past Surgical History:  Procedure Laterality Date  . ERCP N/A 06/09/2017   Procedure: ENDOSCOPIC RETROGRADE CHOLANGIOPANCREATOGRAPHY (ERCP);  Surgeon: Clarene Essex, MD;   Location: [facility];  Service: Endoscopy;  Laterality: N/A;  . IR FLUORO GUIDE PORT INSERTION RIGHT  06/04/2017  . IR US GUIDE VASC ACCESS RIGHT  06/04/2017    REVIEW OF SYSTEMS:   Review of Systems  Constitutional: Negative for appetite change, chills, and unexpected weight change. Positive for fatigue and fever. HENT:   Negative for mouth sores, nosebleeds, sore throat and trouble swallowing.   Eyes: Negative for eye problems and icterus.  Respiratory: Negative for cough, hemoptysis, shortness of breath and wheezing.   Cardiovascular: Negative for chest pain and leg swelling.  Gastrointestinal: Negative for constipation, nausea and vomiting. Positive for mild abdominal discomfort and intermittent diarrhea. Genitourinary: Negative for bladder incontinence, difficulty urinating, dysuria, frequency and hematuria.   Musculoskeletal: Negative for back pain, gait problem, neck pain and neck stiffness.  Skin: Negative for itching and rash.  Neurological: Negative for dizziness, extremity weakness, gait problem, headaches, light-headedness and seizures.  Hematological: Negative for adenopathy. Does not bruise/bleed easily.  Psychiatric/Behavioral: Negative for confusion, depression and sleep disturbance. The patient is not nervous/anxious.     PHYSICAL EXAMINATION:  Blood pressure 118/74, pulse 99, temperature 97.9 F (36.6 C), resp. rate 17, SpO2 100 %.  ECOG PERFORMANCE STATUS: 1 - Symptomatic but completely ambulatory  Physical Exam  Constitutional: Oriented to person, place, and time. Appears chronically ill. HENT:  Head: Normocephalic and atraumatic.  Mouth/Throat: Oropharynx is clear and moist. No oropharyngeal exudate.  Eyes: Conjunctivae are normal. Right eye exhibits no discharge. Left eye exhibits no discharge. No scleral icterus.  Neck: Normal range of motion. Neck supple.  Cardiovascular: Regular rhythm, normal heart sounds and intact distal pulses.  Heart rate was  initially tachycardic at 127 but down to 99 after one liter of normal saline. Pulmonary/Chest: Effort normal and breath sounds normal. No respiratory distress. No wheezes. No rales.  Abdominal: Soft. Bowel sounds are normal. Exhibits no distension and no mass. There is no tenderness.  Musculoskeletal: Normal range of motion. Exhibits no edema.  Lymphadenopathy:    No cervical adenopathy.  Neurological: Alert and oriented to person, place, and time. Exhibits normal muscle tone. Gait normal. Coordination normal.  Skin: Skin is warm and dry. No rash noted. No erythema. No pallor. Diaphoretic upon arrival. Psychiatric: Mood, memory and judgment normal.  Port-A-Cath site without redness or edema. Vitals reviewed.  LABORATORY DATA: Lab Results  Component Value Date   WBC 2.2 (L) 07/24/2017   HGB 8.2 (L) 07/24/2017   HCT 25.3 (L) 07/24/2017   MCV 74.4 (L) 07/24/2017   PLT 96 (L) 07/24/2017      Chemistry      Component Value Date/Time   NA 133 (L) 07/24/2017 0847   K 2.8 (LL) 07/24/2017 0847   CL 93 (L) 06/21/2017 0220   CO2 21 (L) 07/24/2017 4696  BUN 12.0 07/24/2017 0847   CREATININE 1.0 07/24/2017 0847      Component Value Date/Time   CALCIUM 8.8 07/24/2017 0847   ALKPHOS 494 (H) 07/24/2017 0847   AST 43 (H) 07/24/2017 0847   ALT 92 (H) 07/24/2017 0847   BILITOT 0.96 07/24/2017 0847       RADIOGRAPHIC STUDIES:  No results found.   ASSESSMENT/PLAN:  1. 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 Adenocarcinoma consistent with  pancreatobiliary primary.  Cycle 1 FOLFIRINOX 06/18/2018  Cycle 2FOLFIRINOX 07/02/2017  Cycle 3 FOLFIRINOX 07/16/2017 (oxaliplatin dose reduced due to mild thrombocytopenia)  2. Pain secondary to #1.Improved.  3. Weight losssecondary to #1.  4. Microcytic anemia  5. Mild thrombocytopenia  6. Port-A-Cath placement 06/04/2017  7. Elevated liver enzymes/bilirubin10/22/2018-likely early biliary obstruction secondary to pancreas cancer, status post ERCP-placement of a common bile duct stent 06/09/2018  8.Postprandial abdominal pain. Protonix initiated 07/16/2017.  9.  Fever, source unclear 07/23/2017.    Disposition: Ms. Fuentes appears unchanged.  She completed cycle 3 FOLFIRINOX 07/16/2017 with Neulasta support.  She unexpectedly had a fever at her routine follow-up visit on 07/23/2017.  She is not hypotensive.  We reviewed today's labs.  She is not neutropenic.  The bilirubin is in normal range.  Other LFTs with stable elevation.  There is no localizing source for infection by history or exam.  She does not appear acutely ill.  She received a dose of cefepime 2 g IV x1 on 07/23/2017 and then was started on Cipro 500 mg twice daily.  She did not take her dose this morning before coming to the office and was given a dose of oral Cipro 500 mg x1.  The patient appears dehydrated and she was given a liter normal saline in our office.  The patient is noted to have hypokalemia with a potassium level of 2.8.  The patient was given normal saline with 40 mEq of potassium in our office.  A prescription for K-Dur 20 mEq daily was sent to her pharmacy.  The patient is having diarrhea and received Imodium 4 mg p.o. x1 dose in our office.  She was encouraged to use Imodium to control her diarrhea at home.  Blood culture and urine culture drawn on 07/23/2017 are pending.     Near the end of the completion of the patient's potassium infusion, vital signs were rechecked.  Temperature  was up to 100.8, pulse 100, respirations 19, blood pressure 124/75, and O2 sat 100% on room air. Recommend admission to the hospital.  Call placed to hospitalist who is in agreement to accept the patient.  Patient to be admitted to [facility] to the oncology unit.  Patient seen with Dr. Benay Spice.  No orders of the defined types were placed in this encounter.   Mikey Bussing, DNP, AGPCNP-BC, AOCNP 07/24/17 This was a shared visit with Mikey Bussing.  Ms. Savary was interviewed and examined.  She is now day 9 following cycle 3 FOLFIRINOX.  She received Neulasta on 07/18/2017.  She presented yesterday with a high fever.  The fever has persisted for over 24 hours despite cefepime and ciprofloxacin.  Blood and urine cultures  from 07/23/2017 are negative to date.  I suspect a bacterial infection in this patient with an indwelling Port-A-Cath and biliary stent.  Liver enzymes are are mildly elevated and stable.  The bilirubin is normal.  I discussed the case with Dr. Watt Climes yesterday.  She now has diarrhea, likely secondary to chemotherapy-induced enteritis.  She could have bacterial translocation from this.  Recommendations: 1.  Admit for intravenous hydration and broad-spectrum intravenous antibiotics 2.  Follow-up cultures 3.  Repeat liver panel and obtain abdominal imaging for a persistent fever and further elevation of the liver enzymes 4.  Consult Dr. Watt Climes if biliary sepsis is suspected 5.  Check stool C. difficile toxin  Please call Oncology as needed over the weekend.  I will check on her 07/27/2017.  I appreciate the care from the medicine service.  [name], MD

## 2017-07-24 NOTE — H&P (Signed)
[facility] History and Physical  Crystal Carlson INO:[id number] DOB: Dec 04, 1965 DOA: 07/24/2017  Referring physician:  PCP: Mack Hook, MD  Specialists:   Chief Complaint: fevers   HPI: Crystal Carlson is a 51 y.o. 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 Patient states that she went for follow up visit to oncology clinic yesterday and noted to have a fever. Blood culture were drawn, received iv cefepime and started on cipro and went home. She presented today with recurrent fevers, chills and referred for inpatient admission for antibiotics. She denies chest pains, no shortness of breath, no cough, no dysuria, no abdominal pains. She had two episodes of diarrhea, no blood in stool, no vomiting. She denies sick contacts, no rash, no pain associated with port cath site.      Review of Systems: The patient denies anorexia, fever, weight loss,, vision loss, decreased hearing, hoarseness, chest pain, syncope, dyspnea on exertion, peripheral edema, balance deficits, hemoptysis, abdominal pain, melena, hematochezia, severe indigestion/heartburn, hematuria, incontinence, genital sores, muscle weakness, suspicious skin lesions, transient blindness, difficulty walking, depression, unusual weight change, abnormal bleeding, enlarged lymph nodes, angioedema, and breast masses.    Past Medical History:  Diagnosis Date  . Cancer of pancreas ([place]) 05/26/2017  . Chronic pancreatitis ([place]) 05/19/2017  . Microcytic anemia 05/19/2017  . Prediabetes 2016   when living in [place]   Past Surgical History:   Procedure Laterality Date  . ERCP N/A 06/09/2017   Procedure: ENDOSCOPIC RETROGRADE CHOLANGIOPANCREATOGRAPHY (ERCP);  Surgeon: Clarene Essex, MD;  Location: [facility];  Service: Endoscopy;  Laterality: N/A;  . IR FLUORO GUIDE PORT INSERTION RIGHT  06/04/2017  . IR US GUIDE VASC ACCESS RIGHT  06/04/2017   Social History:  reports that  has never smoked. she has never used smokeless tobacco. She reports that she does not drink alcohol or use drugs. Home;  where does patient live--home, ALF, SNF? and with whom if at home? Yes;  Can patient participate in ADLs?  No Known Allergies  Family History  Problem Relation Age of Onset  . Hypertension Mother   . Arthritis Mother   . Diabetes Father   . Heart disease Father        ?valvular problems  . Hyperlipidemia Son   . Diabetes Brother     (be sure to complete)  Prior to Admission medications   Medication Sig Start Date End Date Taking? Authorizing Provider  ciprofloxacin (CIPRO) 500 MG tablet Take 1 tablet (500 mg total) by mouth 2 (two) times daily. 07/23/17   Owens Shark, NP  dexamethasone (DECADRON) 4 MG tablet Take 1 tablet twice a day for 3 days after each cycle of chemo. 07/02/17   Maryanna Shape, NP  docusate sodium (COLACE) 100 MG capsule Take 1 capsule (100 mg total) by mouth 2 (two) times daily. 05/27/17   Lavina Hamman, MD  ferrous sulfate 325 (65 FE) MG EC tablet Take 1 tablet (325 mg total) by mouth 3 (three) times daily with meals. 06/18/17   Ladell Pier, MD  gabapentin (NEURONTIN) 300 MG capsule Take 300 mg by mouth at bedtime.     [provider]  HYDROmorphone (DILAUDID) 4 MG tablet Take 1-2  tablets (4-8 mg total) every 4 (four) hours as needed by mouth for severe pain. 07/02/17   Maryanna Shape, NP  lidocaine-prilocaine (EMLA) cream Apply to port site one hour prior to use. Do not rub in. Cover with plastic. Patient taking differently: Apply 1 application daily as needed topically. Apply to port  site one hour prior to use. Do not rub in. Cover with plastic. 06/03/17   Ladell Pier, MD  LORazepam (ATIVAN) 0.5 MG tablet Take 1 tablet (0.5 mg total) by mouth every 8 (eight) hours as needed for anxiety (or nausea). 07/16/17   Owens Shark, NP  morphine (MS CONTIN) 15 MG 12 hr tablet Take 1 tablet (15 mg total) every 12 (twelve) hours by mouth. 07/02/17   Curcio, Roselie Awkward, NP  pantoprazole (PROTONIX) 40 MG tablet Take 1 tablet (40 mg total) by mouth daily. 07/16/17   Owens Shark, NP  polyethylene glycol [facility] / GLYCOLAX) packet Take 17 g by mouth daily. 05/27/17   Lavina Hamman, MD  potassium chloride SA (K-DUR,KLOR-CON) 20 MEQ tablet Take 1 tablet (20 mEq total) by mouth daily. 07/24/17   Maryanna Shape, NP  prochlorperazine (COMPAZINE) 10 MG tablet Take 1 tablet (10 mg total) by mouth every 6 (six) hours as needed for nausea or vomiting. Patient not taking: Reported on 07/23/2017 07/07/17   Ladell Pier, MD  sorbitol 70 % solution Take 30 mLs by mouth 2 (two) times daily. 07/08/17   Ladell Pier, MD   Physical Exam: There were no vitals filed for this visit.   General:  Alert. No distress. Febrile   Eyes: eom-I, perrla   ENT: no rash   Neck: supple, no jvd   Cardiovascular: s1,s2, rrr  Respiratory: CTA BL  Abdomen: soft, mild epigastric tenderness   Skin: no rash   Musculoskeletal: no leg edema   Psychiatric: no hallucinations   Neurologic: cn 2-12 intact   Labs on Admission:  Basic Metabolic Panel: Recent Labs  Lab 07/23/17 1558 07/24/17 0847  NA 134* 133*  K 3.6 2.8*  CO2 20* 21*  GLUCOSE 167* 147*  BUN 10.4 12.0  CREATININE 1.0 1.0  CALCIUM 8.8 8.8   Liver Function Tests: Recent Labs  Lab 07/23/17 1558 07/24/17 0847  AST 57* 43*  ALT 106* 92*  ALKPHOS 521* 494*  BILITOT 0.81 0.96  PROT 6.8 7.1  ALBUMIN 2.9* 2.9*   No results for input(s): LIPASE, AMYLASE in the last 168 hours. No results for input(s): AMMONIA in the last  168 hours. CBC: Recent Labs  Lab 07/23/17 1519 07/24/17 0847  WBC 2.4* 2.2*  NEUTROABS 1.7 1.8  HGB 8.3* 8.2*  HCT 26.0* 25.3*  MCV 76.7* 74.4*  PLT 95* 96*   Cardiac Enzymes: No results for input(s): CKTOTAL, CKMB, CKMBINDEX, TROPONINI in the last 168 hours.  BNP (last 3 results) No results for input(s): BNP in the last 8760 hours.  ProBNP (last 3 results) No results for input(s): PROBNP in the last 8760 hours.  CBG: No results for input(s): GLUCAP in the last 168 hours.  Radiological Exams on Admission: No results found.  EKG: Independently reviewed.   Assessment/Plan Active Problems:   Fever   51 y.o. 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   Fever. Leukopenia s/p chemo. unclear source of fever at  This time. We will start empiric iv antibiotic treatment with cefepime/vanc pend blood cultures. R/o port infection/bactermiea. R/o biliary source but stable LFTs, bili. . Obtain ct abd r/o intraabdominal infection, mild tender on exam.  Will obtain CXR, urine cultures as well.  Hypokalemia. Replace, check Mg. 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 Needs oncology outpatient follow up at discharge   Cytopenia. Anemia,  thrombocytopenia likely due to chemo. As above.  No s/s of bleeding. Will f/u AM. Neupogen  as needed.    None;  if consultant consulted, please document name and whether formally or informally consulted  Code Status: full (must indicate code status--if unknown or must be presumed, indicate so) Family Communication: d/w patient, her family, RN (indicate person spoken with, if applicable, with phone number if by telephone) Disposition Plan: home 2-3 days (indicate anticipated LOS)  Time spent: >45 minutes   Kinnie Feil [facility] Pager [phone]  If 7PM-7AM, please contact night-coverage [email or web address] Password [facility] 07/24/2017, 5:44 PM

## 2017-07-24 NOTE — Progress Notes (Signed)
Pharmacy Antibiotic Note  Crystal Carlson is a 51 y.o. female admitted on 07/24/2017 with FN.  Pharmacy has been consulted for vanc/cefepime dosing.  Plan: 1) Vancomycin 1g loading dose x 1 then 750mg  IV q24 (est AUC 410 using SCr of 1) 2) Cefepime 2g IV q8     Temp (24hrs), Avg:99.2 F (37.3 C), Min:97.9 F (36.6 C), Max:100.8 F (38.2 C)  Recent Labs  Lab 07/23/17 1519 07/23/17 1558 07/24/17 0847 07/24/17 0847  WBC 2.4*  --  2.2*  --   CREATININE  --  1.0  --  1.0    Estimated Creatinine Clearance: 57.8 mL/min (by C-G formula based on SCr of 1 mg/dL).    No Known Allergies   Thank you for allowing pharmacy to be a part of this patient's care.   Adrian Saran, PharmD, BCPS Pager [phone] 07/24/2017 6:06 PM

## 2017-07-24 NOTE — Progress Notes (Signed)
Up to the bathroom with standby assist to void x 2. 1 episode of diarrhea. Treated with Imodium 4 mg.  VS obtained periodically throughout the day. Noted temp increasing. Last temp 100.8 Mikey Bussing, NP made aware.  Pt to be admitted to Medical Oncology.  Hospitalist to admit.Awaiting room assignment after contacting pt placement. Pt and husband aware.  IV KCL completed . IV @ 160ml/hr of normal saline per order Mikey Bussing, NP

## 2017-07-24 NOTE — Progress Notes (Signed)
Crystal Carlson, Crystal Carlson Female, 51 y.o., 21-Jun-1966  History of metastatic pancreatic cancer to the liver, she received her chemotherapy on November 29 and Neulasta.   She is sent to direct admission from oncologist office due to fever, dehydration.     blood culture was sent from the oncology clinic on December 6.  She is started on Cipro on December 6.  However  fever persist, she is referred to direct admission for fever workup.    Per Dr. Ammie Dalton, patient's liver function stable, ab pain at baseline.  consider CT abdomen if LFT worsening.  Patient is status post biliary stent by [facility] Dr. Genevive Bi.   She is accepted to med [facility].  Please call flow manager upon patient's arrival.

## 2017-07-24 NOTE — Patient Instructions (Signed)
Begin K-Dur 20 milliequivalents daily.  This was sent to your pharmacy.  Use Imodium 2 tablets after the first loose stool and then 1 tablet after each additional loose stool up to 8 tablets in 24-hour period.

## 2017-07-25 DIAGNOSIS — N1330 Unspecified hydronephrosis: Secondary | ICD-10-CM

## 2017-07-25 DIAGNOSIS — D61810 Antineoplastic chemotherapy induced pancytopenia: Secondary | ICD-10-CM

## 2017-07-25 DIAGNOSIS — E440 Moderate protein-calorie malnutrition: Secondary | ICD-10-CM

## 2017-07-25 DIAGNOSIS — D6481 Anemia due to antineoplastic chemotherapy: Secondary | ICD-10-CM

## 2017-07-25 DIAGNOSIS — T451X5A Adverse effect of antineoplastic and immunosuppressive drugs, initial encounter: Secondary | ICD-10-CM

## 2017-07-25 LAB — CBC WITH DIFFERENTIAL/PLATELET
BAND NEUTROPHILS: 2 %
Basophils Absolute: 0 10*3/uL (ref 0.0–0.1)
Basophils Relative: 0 %
Eosinophils Absolute: 0 10*3/uL (ref 0.0–0.7)
Eosinophils Relative: 0 %
HCT: 18.7 % — ABNORMAL LOW (ref 36.0–46.0)
HEMOGLOBIN: 6.3 g/dL — AB (ref 12.0–15.0)
Lymphocytes Relative: 17 %
Lymphs Abs: 0.6 10*3/uL — ABNORMAL LOW (ref 0.7–4.0)
MCH: 25.5 pg — AB (ref 26.0–34.0)
MCHC: 33.7 g/dL (ref 30.0–36.0)
MCV: 75.7 fL — AB (ref 78.0–100.0)
METAMYELOCYTES PCT: 1 %
MONO ABS: 0.5 10*3/uL (ref 0.1–1.0)
Monocytes Relative: 13 %
NEUTROS ABS: 2.6 10*3/uL (ref 1.7–7.7)
Neutrophils Relative %: 67 %
PLATELETS: 65 10*3/uL — AB (ref 150–400)
RBC: 2.47 MIL/uL — AB (ref 3.87–5.11)
RDW: 18.8 % — ABNORMAL HIGH (ref 11.5–15.5)
WBC: 3.7 10*3/uL — AB (ref 4.0–10.5)

## 2017-07-25 LAB — URINE CULTURE
CULTURE: NO GROWTH
Organism ID, Bacteria: NO GROWTH

## 2017-07-25 LAB — COMPREHENSIVE METABOLIC PANEL
ALBUMIN: 2.3 g/dL — AB (ref 3.5–5.0)
ALK PHOS: 304 U/L — AB (ref 38–126)
ALT: 55 U/L — AB (ref 14–54)
AST: 29 U/L (ref 15–41)
Anion gap: 6 (ref 5–15)
BILIRUBIN TOTAL: 0.4 mg/dL (ref 0.3–1.2)
BUN: 8 mg/dL (ref 6–20)
CO2: 20 mmol/L — ABNORMAL LOW (ref 22–32)
CREATININE: 0.74 mg/dL (ref 0.44–1.00)
Calcium: 7.4 mg/dL — ABNORMAL LOW (ref 8.9–10.3)
Chloride: 113 mmol/L — ABNORMAL HIGH (ref 101–111)
GFR calc Af Amer: >60 mL/min (ref >60)
GFR calc non Af Amer: >60 mL/min (ref >60)
GLUCOSE: 134 mg/dL — AB (ref 65–99)
Potassium: 3 mmol/L — ABNORMAL LOW (ref 3.5–5.1)
Sodium: 139 mmol/L (ref 135–145)
TOTAL PROTEIN: 5.3 g/dL — AB (ref 6.5–8.1)

## 2017-07-25 LAB — MAGNESIUM: Magnesium: 2.2 mg/dL (ref 1.7–2.4)

## 2017-07-25 LAB — PREPARE RBC (CROSSMATCH)

## 2017-07-25 LAB — ABO/RH: ABO/RH(D): O POS

## 2017-07-25 MED ORDER — SODIUM CHLORIDE 0.9 % IV SOLN
Freq: Once | INTRAVENOUS | Status: AC
  Administered 2017-07-25: 08:00:00 via INTRAVENOUS

## 2017-07-25 MED ORDER — ACETAMINOPHEN 325 MG PO TABS
650.0000 mg | ORAL_TABLET | Freq: Four times a day (QID) | ORAL | Status: DC | PRN
  Administered 2017-07-25: 650 mg via ORAL

## 2017-07-25 MED ORDER — ACETAMINOPHEN 325 MG PO TABS: 650.0000 mg | ORAL_TABLET | Freq: Once | ORAL | Status: DC

## 2017-07-25 MED ORDER — DIPHENHYDRAMINE HCL 50 MG/ML IJ SOLN: 12.5000 mg | Freq: Four times a day (QID) | INTRAMUSCULAR | Status: DC | PRN

## 2017-07-25 MED ORDER — SACCHAROMYCES BOULARDII 250 MG PO CAPS
250.0000 mg | ORAL_CAPSULE | Freq: Two times a day (BID) | ORAL | Status: DC
  Administered 2017-07-25 – 2017-07-27 (×5): 250 mg via ORAL
  Filled 2017-07-25 (×5): qty 1

## 2017-07-25 MED ORDER — HYDROCORTISONE 1 % EX CREA
1.0000 "application " | TOPICAL_CREAM | Freq: Three times a day (TID) | CUTANEOUS | Status: DC | PRN
  Filled 2017-07-25: qty 28

## 2017-07-25 MED ORDER — GUAIFENESIN-DM 100-10 MG/5ML PO SYRP: 10.0000 mL | ORAL_SOLUTION | ORAL | Status: DC | PRN

## 2017-07-25 MED ORDER — PSYLLIUM 95 % PO PACK
1.0000 | PACK | Freq: Every day | ORAL | Status: DC
  Administered 2017-07-25 – 2017-07-27 (×3): 1 via ORAL
  Filled 2017-07-25 (×3): qty 1

## 2017-07-25 MED ORDER — LIP MEDEX EX OINT
1.0000 | TOPICAL_OINTMENT | Freq: Two times a day (BID) | CUTANEOUS | Status: DC
Start: 2017-07-25 — End: 2017-07-27
  Administered 2017-07-25 – 2017-07-27 (×5): 1 via TOPICAL
  Filled 2017-07-25: qty 7

## 2017-07-25 MED ORDER — HYDROCORTISONE 2.5 % RE CREA: 1.0000 "application " | TOPICAL_CREAM | Freq: Four times a day (QID) | RECTAL | Status: DC | PRN

## 2017-07-25 MED ORDER — ALUM & MAG HYDROXIDE-SIMETH 200-200-20 MG/5ML PO SUSP: 30.0000 mL | Freq: Four times a day (QID) | ORAL | Status: DC | PRN

## 2017-07-25 MED ORDER — POTASSIUM CHLORIDE CRYS ER 20 MEQ PO TBCR
40.0000 meq | EXTENDED_RELEASE_TABLET | Freq: Three times a day (TID) | ORAL | Status: AC
  Administered 2017-07-25 (×3): 40 meq via ORAL
  Filled 2017-07-25 (×3): qty 2

## 2017-07-25 MED ORDER — LACTATED RINGERS IV BOLUS (SEPSIS): 1000.0000 mL | Freq: Three times a day (TID) | INTRAVENOUS | Status: DC | PRN

## 2017-07-25 MED ORDER — PHENOL 1.4 % MT LIQD
1.0000 | OROMUCOSAL | Status: DC | PRN
  Filled 2017-07-25: qty 177

## 2017-07-25 MED ORDER — MENTHOL 3 MG MT LOZG
1.0000 | LOZENGE | OROMUCOSAL | Status: DC | PRN
  Filled 2017-07-25: qty 9

## 2017-07-25 NOTE — Progress Notes (Signed)
[facility] PROGRESS NOTE  Crystal Carlson RSW:[id number] DOB: 14-Dec-1965 DOA: 07/24/2017 PCP: Mack Hook, MD  Brief summary   51 y.o. 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    Assessment/Plan:  Fever. Leukopenia s/p chemo. unclear source of fever at this time. ct abdomen showed an abnormal enhancing structure with mixed attenuation just lateral to the cecum. questionable appendicitis. Vs metastasis. Consult surgery to r/o appendicitis. Appreciate the input. Unlikely  biliary source but stable LFTs, bili. cont empiric iv antibiotic treatment with cefepime/vanc pend blood culture from port cath. urine cultures as well. Check c diff.   Hypokalemia, hypomagnesemia. cont to replace, potassium and Mg. 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 Ct abdomen showed possible new metastasis. Will f/u with surgery, urology. Needs oncology outpatient follow up at discharge   Cytopenia. Anemia, thrombocytopenia likely due to chemo. As above.  No s/s of acute bleeding. Hold Lovenox due to thrombocytopenia. transfusing 1 unit of PRBC today. Tf as needed.  Neupogen  as needed.   Left hydronephrosis. Ct abd: New left hydronephrosis. Poor excretion from the  left kidney on delayed imaging. An underlying cause is not identified.  -renal function is stable. Consulted urology evaluation   Code Status: full  Family Communication: d/w patient, her family. [place], Therapist, sports (indicate person spoken with, relationship, and if by phone, the number) Disposition Plan: home 2-3 days    Consultants:  Surgery   Procedures:  none  Antibiotics: Anti-infectives (From admission, onward)   Start     Dose/Rate Route Frequency Ordered Stop   07/25/17 1800  vancomycin (VANCOCIN) IVPB 750 mg/150 ml premix     750 mg 150 mL/hr over 60 Minutes Intravenous Every 24 hours 07/24/17 1810     07/25/17 0600  ceFEPIme (MAXIPIME) 2 g in dextrose 5 % 50 mL IVPB     2 g 100 mL/hr over 30 Minutes Intravenous Every 8 hours 07/24/17 1810     07/24/17 1830  vancomycin (VANCOCIN) IVPB 1000 mg/200 mL premix     1,000 mg 200 mL/hr over 60 Minutes Intravenous  Once 07/24/17 1810 07/24/17 2052   07/24/17 1830  ceFEPIme (MAXIPIME) 2 g in dextrose 5 % 50 mL IVPB     2 g 100 mL/hr over 30 Minutes Intravenous  Once 07/24/17 1810 07/24/17 2335        (indicate start date, and stop date if known)  HPI/Subjective: Alert. Reports feeling better.  Now developed diarrhea in the morning.   Objective: Vitals:   07/24/17 2300 07/25/17 0616  BP: 102/76 112/77  Pulse: 88 77  Resp: (!) 24 20  Temp: 98.9 F (37.2 C) 98.4 F (36.9 C)  SpO2: 100% 100%    Intake/Output Summary (Last 24 hours) at 07/25/2017 0745 Last data filed at 07/25/2017 0938 Gross per 24 hour  Intake 1042.92 ml  Output 900 ml  Net 142.92 ml   There were no vitals filed for this visit.  Exam:   General:  No distress   Cardiovascular: s1,s2 rrr  Respiratory: cta bl   Abdomen: soft, mild rlq tender, epigastric tenderness   Musculoskeletal: no leg edema    Data Reviewed: Basic Metabolic Panel: Recent Labs  Lab 07/23/17 1558 07/24/17 0847 07/24/17 1753 07/25/17 0500  NA 134* 133*  --  139  K  3.6 2.8*  --  3.0*  CL  --   --   --  113*  CO2 20* 21*  --  20*  GLUCOSE 167* 147*  --  134*  BUN 10.4 12.0  --  8  CREATININE 1.0 1.0  --  0.74  CALCIUM 8.8 8.8  --  7.4*  MG  --   --  1.4*  --    Liver Function Tests: Recent Labs  Lab 07/23/17 1558 07/24/17 0847 07/25/17 0500  AST 57* 43* 29  ALT 106* 92* 55*  ALKPHOS 521* 494* 304*  BILITOT 0.81 0.96 0.4  PROT 6.8 7.1 5.3*  ALBUMIN 2.9* 2.9* 2.3*   No results for input(s): LIPASE, AMYLASE in the last 168 hours. No results for input(s): AMMONIA in the last 168 hours. CBC: Recent Labs  Lab 07/23/17 1519 07/24/17 0847 07/25/17 0500  WBC 2.4* 2.2* 3.7*  NEUTROABS 1.7 1.8 2.6  HGB 8.3* 8.2* 6.3*  HCT 26.0* 25.3* 18.7*  MCV 76.7* 74.4* 75.7*  PLT 95* 96* 65*   Cardiac Enzymes: No results for input(s): CKTOTAL, CKMB, CKMBINDEX, TROPONINI in the last 168 hours. BNP (last 3 results) No results for input(s): BNP in the last 8760 hours.  ProBNP (last 3 results) No results for input(s): PROBNP in the last 8760 hours.  CBG: No results for input(s): GLUCAP in the last 168 hours.  Recent Results (from the past 240 hour(s))  Culture, Blood     Status: None (Preliminary result)   Collection Time: 07/23/17  4:59 PM  Result Value Ref Range Status   BLOOD CULTURE, ROUTINE Preliminary report  Preliminary   Organism ID, Bacteria Comment  Preliminary    Comment: No growth detected at this time.     Studies: X-ray Chest Pa And Lateral  Result Date: 07/24/2017 CLINICAL DATA:  Initial evaluation for acute fever. History of pancreatic cancer. EXAM: CHEST  2 VIEW COMPARISON:  Prior radiograph from 06/21/2017. FINDINGS: Right-sided Port-A-Cath in place, stable. Cardiac and mediastinal silhouettes are unchanged, and remain within normal limits. Lungs are normally inflated. No focal infiltrates. No pulmonary edema or pleural effusion. No pneumothorax. No acute osseous abnormality. Biliary stent overlies of right upper quadrant.  IMPRESSION: No active cardiopulmonary disease. Electronically Signed   By: Jeannine Boga M.D.   On: 07/24/2017 23:53   Ct Abdomen Pelvis W Contrast  Addendum Date: 07/25/2017   ADDENDUM REPORT: 07/25/2017 04:49 ADDENDUM: Findings discussed with the patient's nurse practitioner, Ms. Lynch. Electronically Signed   By: Dorise Bullion III M.D   On: 07/25/2017 04:49   Result Date: 07/25/2017 CLINICAL DATA:  Pancreatic cancer.  Fever.  Possible abscess. EXAM: CT ABDOMEN AND PELVIS WITH CONTRAST TECHNIQUE: Multidetector CT imaging of the abdomen and pelvis was performed using the standard protocol following bolus administration of intravenous contrast. CONTRAST:  57mL ISOVUE-300 IOPAMIDOL (ISOVUE-300) INJECTION 61% COMPARISON:  CT scan February 20, 2017.  MRI May 25, 2017 FINDINGS: Lower chest: No acute abnormality. Hepatobiliary: Marked intra and extrahepatic biliary duct dilatation is identified. A stent is seen in the common bile duct extending from the liver through the head of the pancreas. Pharmacist, community  in the gallbladder is likely secondary to the stent. The intrahepatic portal vein is patent. The extrahepatic portion of the portal vein is occluded as is the proximal splenic vein, secondary to the patient's known pancreatic mass. 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 Liver metastases are identified. A prominent metastasis in the liver on series 2, image 11 measures 2.4 cm. Both lobes appear to be involved. Pancreas: The patient's known pancreatic mass in the neck of the pancreas is again identified. It is difficult to measure but appears to measure at least 3.9 cm. Pancreatic tail is atrophic. Spleen: The spleen measures 15 cm in cranial caudal dimension. Adrenals/Urinary Tract: The right kidney and ureter are unremarkable. The bladder is normal. There is new moderate left hydronephrosis. No stones are seen along the course of the left ureter. No cause for the left-sided  hydronephrosis is identified. There is poor excretion from the left kidney on delayed images. The adrenal glands are normal. Stomach/Bowel: The stomach is normal. There is inflammation around the duodenum, secondary to the pancreatic process. The remainder of the small bowel is normal. Fecal loading is seen in the colon. The colon is otherwise normal. There is an abnormality just lateral to the cecum as seen on series 2, image 57. A separate appendix is not visualized on today's study. I am concerned that the abnormality lateral to the cecum may represent a tip appendicitis. However, this is not definitive and a metastatic lesion in the right pericolic gutter is not completely excluded. Vascular/Lymphatic: The patient's known pancreatic malignancy encases the common hepatic artery, the proximal splenic artery, and the proximal SMA. It also results in occlusion of the main portal and splenic veins. The SMV is also occluded. There is a rind of increased attenuation around the aorta extending to the bifurcation which could represent malignancy or inflammation. It is difficult to see the discrete abnormal nodes in the porta hepatis described on the MRI. The peritoneum and omentum are otherwise normal. Reproductive: Uterus and bilateral adnexa are unremarkable. Other: A small amount of free fluid is seen in the pelvis. Musculoskeletal: No acute or significant osseous findings. IMPRESSION: 1. There is an abnormal enhancing structure with mixed attenuation just lateral to the cecum. I am concerned this is due to appendicitis. A metastasis in the right pericolic gutter is another possibility. 2. New left hydronephrosis. Poor excretion from the left kidney on delayed imaging. An underlying cause is not identified. 3. Metastatic pancreatic cancer as described above. 4. New splenomegaly. Findings being called to the referring clinical team. Electronically Signed: By: Dorise Bullion III M.D On: 07/25/2017 00:38    Scheduled  Meds: . enoxaparin (LOVENOX) injection  40 mg Subcutaneous Q24H  . ferrous sulfate  325 mg Oral TID WC  . pantoprazole  40 mg Oral Daily  . potassium chloride SA  40 mEq Oral Daily  . sodium chloride flush  3 mL Intravenous Q12H   Continuous Infusions: . sodium chloride    . ceFEPime (MAXIPIME) IV Stopped (07/25/17 0553)  . dextrose 5 % and 0.9 % NaCl with KCl 20 mEq/L 125 mL/hr at 07/24/17 2021  . vancomycin      Active Problems:   Fever    Time spent: >35 minutes     Kinnie Feil  [facility] Pager [phone]. If 7PM-7AM, please contact night-coverage at [email or web address], password [facility] 07/25/2017, 7:45 AM  LOS: 1 day

## 2017-07-25 NOTE — Consult Note (Signed)
Crystal Carlson., Crystal Carlson, Crystal Carlson [address] Phone: [phone] FAX: [phone]     Crystal Carlson  1965/09/09 [id number]  CARE TEAM:  PCP: Mack Hook, MD  Outpatient Care Team: Patient Care Team: Mack Hook, MD as PCP - General (Internal Medicine) Ladell Pier, MD as Consulting Physician (Oncology) Clarene Essex, MD as Consulting Physician (Gastroenterology)  Inpatient Treatment Team: Treatment Team: Attending Provider: Kinnie Feil, MD; Consulting Physician: Ladell Pier, MD; Rounding Team: Redmond Baseman, MD; Registered Nurse: Clearence Ped, RN; Consulting Physician: Edison Pace, Md, MD   This patient is a 51 y.o.female who presents today for surgical evaluation at the request of Dr Daleen Bo.   Chief complaint / Reason for evaluation: Abnormal appendix.  Concern for appendicitis.  51 y.o.femaleoriginally from [place].  Multilingual.  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  Treatment includes ERCP with biliary stent on (06/09/17), port cath placement (06/04/17), currently on chemotherapy withFOLFIRINOX status post 3 cycles.  Last cycle 07/16/2017.  She presented to the office with some fatigue and fevers.  Concern for neutropenic fever.  Given antibiotics.  However had persistent fevers and chills.  Admitted.  Fever workup.  Placed on broad-spectrum antibiotics.  Has some complaints of some lower abdominal suprapubic discomfort.  But that has been going on for a while.  A couple loose bowel movements but no severe diarrhea.  No hematochezia or melena.  No nausea or vomiting.  She has been tolerating solid food relatively well.  She has decreased appetite but that has been  going on for a while.  No personal nor family history of other GI/colon cancer, inflammatory bowel disease, irritable bowel syndrome, allergy such as Celiac Sprue, dietary/dairy problems, colitis, ulcers nor gastritis.  No recent sick contacts/gastroenteritis.  No travel outside the country.  No changes in diet.  No dysphagia to solids or liquids.  No significant heartburn or reflux.  No hematochezia, hematemesis, coffee ground emesis.  No evidence of prior gastric/peptic ulceration.  CT scan done.  Concern for some left hydronephrosis.  Appendix not normal.  Suspect peritoneal metastases nearby.  Appendicitis less likely.  Based on fever, surgical consultation requested to rule out appendicitis.  Patient's mother is in the room.  While tired she feels a little better since admission.  Again denies any severe nausea or vomiting.  She wants to eat.  Abdominal pain is midline and not right-sided.  No problems with cough or bed shake.   Assessment  Crystal Carlson  51 y.o. 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  Plan:  -Retry diet.  Serial exams.  If her abdominal pain remains mild/table and she tolerates a diet without any nausea or vomiting, that argues against appendicitis.  She has no evidence of peritonitis at this time so I am rather skeptical.  She is a poor operative candidate with her severe pancytopenia and metastatic pancreatic cancer despite her otherwise young age and otherwise  decent health.  Would want her anemia better controlled.  I believe transfusion is being considered.  As long as the platelet count  is above 50, her bleeding risk should not be markedly elevated.  I am skeptical that it would come to that point.  If she truly does have appendicitis, I do feel like she would need surgery as I do not think she would be able to resolve on antibiotics alone plus it would delay future chemotherapy which in the long run is essential to her life expectancy.  -VTE prophylaxis- SCDs, etc -mobilize as tolerated to help recovery  45 minutes spent in review, evaluation, examination, counseling, and coordination of care.  More than 50% of that time was spent in counseling.  I updated the patient's status to the patient, nurse, and family.  Recommendations were made.  Questions were answered.  They expressed understanding & appreciation.   Adin Hector, M.D., F.A.C.S. Gastrointestinal and Minimally Invasive Surgery [facility] [address], [place], [place] [address] [phone] Main / Paging   07/25/2017      Past Medical History:  Diagnosis Date  . Cancer of pancreas ([facility]) 05/26/2017  . Chronic pancreatitis ([facility]) 05/19/2017  . Microcytic anemia 05/19/2017  . Prediabetes 2016   when living in [place]    Past Surgical History:  Procedure Laterality Date  . ERCP N/A 06/09/2017   Procedure: ENDOSCOPIC RETROGRADE CHOLANGIOPANCREATOGRAPHY (ERCP);  Surgeon: Clarene Essex, MD;  Location: [facility];  Service: Endoscopy;  Laterality: N/A;  . IR FLUORO GUIDE PORT INSERTION RIGHT  06/04/2017  . IR US GUIDE VASC ACCESS RIGHT  06/04/2017    Social History   Socioeconomic History  . Marital status: Married    Spouse name: Not on file  . Number of children: 3  . Years of education: college grad--civics--states equivalent to High school here.  . Highest education level: Not on file  Social Needs  . Financial resource strain: Not on file  . Food insecurity - worry: Not on file  . Food insecurity - inability: Not on file  . Transportation needs -  medical: Not on file  . Transportation needs - non-medical: Not on file  Occupational History  . 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 Smoking status: Never Smoker  . Smokeless tobacco: Never Used  Substance and Sexual Activity  . Alcohol use: No  . Drug use: No  . Sexual activity: Not on file  Other Topics Concern  . Not on file  Social History Narrative   Originally from [place]   Moved to [place] In 28   Husband came in 2000   Lives at home with husband, 3 children and mother and father in Sports coach.    Family History  Problem Relation Age of Onset  . Hypertension Mother   . Arthritis Mother   . Diabetes Father   . Heart disease Father        ?valvular problems  . Hyperlipidemia Son   . Diabetes Brother     Current Facility-Administered Medications  Medication Dose Route Frequency Provider Last Rate Last Dose  . 0.9 %  sodium chloride infusion   Intravenous Once Walden Field A, NP      . ceFEPIme (MAXIPIME) 2 g in dextrose 5 % 50 mL IVPB  2 g Intravenous Q8H Adrian Saran, [place]   Stopped at 07/25/17 1540  . dextrose 5 % and 0.9 % NaCl with KCl 20  mEq/L infusion   Intravenous Continuous Kinnie Feil, MD 125 mL/hr at 07/24/17 2021    . ferrous sulfate tablet 325 mg  325 mg Oral TID WC Buriev, Arie Sabina, MD      . HYDROmorphone (DILAUDID) tablet 2 mg  2 mg Oral Q4H PRN Kinnie Feil, MD      . LORazepam (ATIVAN) tablet 0.5 mg  0.5 mg Oral Q8H PRN Kinnie Feil, MD   0.5 mg at 07/24/17 2044  . pantoprazole (PROTONIX) EC tablet 40 mg  40 mg Oral Daily Kinnie Feil, MD   40 mg at 07/24/17 1852  . potassium chloride SA (K-DUR,KLOR-CON) CR tablet 40 mEq  40 mEq Oral TID Kinnie Feil, MD      . sodium chloride flush (NS) 0.9 % injection 3 mL  3 mL Intravenous Q12H Kinnie Feil, MD   3 mL at 07/24/17 2237  . vancomycin (VANCOCIN) IVPB 750 mg/150 ml premix  750 mg Intravenous Q24H Arlyn Dunning M, RPH         No Known  Allergies  ROS:   All other systems reviewed & are negative except per HPI or as noted below: Constitutional:  ++ fevers, chills, sweats.  Weight loss but more recently stable Eyes:  No vision changes, No discharge HENT:  No sore throats, nasal drainage Lymph: No neck swelling, No bruising easily Pulmonary:  No cough, productive sputum CV: No orthopnea, PND  Patient walks 20 minutes without difficulty.  No exertional chest/neck/shoulder/arm pain. GI:  No other personal nor family history of GI/colon cancer, inflammatory bowel disease, irritable bowel syndrome, allergy such as Celiac Sprue, dietary/dairy problems, colitis, ulcers nor gastritis.  No recent sick contacts/gastroenteritis.  No travel outside the country.  No changes in diet. Renal: No UTIs, No hematuria Genital:  No drainage, bleeding, masses Musculoskeletal: No severe joint pain.  Good ROM major joints Skin:  No sores or lesions.  No rashes Heme/Lymph:  No easy bleeding.  No swollen lymph nodes Neuro: No focal weakness/numbness.  No seizures Psych: No suicidal ideation.  No hallucinations  BP 112/77 (BP Location: Left Arm)   Pulse 77   Temp 98.4 F (36.9 C) (Oral)   Resp 20   SpO2 100%   Physical Exam: General: Pt awake/alert/oriented x4 in no major acute distress.  Tired but no toxic Eyes: PERRL, normal EOM. Sclera nonicteric Neuro: CN II-XII intact w/o focal sensory/motor deficits. Lymph: No head/neck/groin lymphadenopathy Psych:  No delerium/psychosis/paranoia HENT: Normocephalic, Mucus membranes moist.  No thrush Neck: Supple, No tracheal deviation Chest: No pain.  Good respiratory excursion.  Port-A-Cath accessed without any evidence of cellulitis CV:  Pulses intact.  Regular rhythm Abdomen: Soft, Nondistended.  Minimal discomfort in the suprapubic region only.  No guarding.  The rest of the abdomen including the right side his nontender.  No pain with bed shake or cough.  No percussive tenderness.  No evidence of  peritonitis.  No rebound tenderness.  No guarding.  No umbilical hernia. Gen:  No inguinal hernias.  No inguinal lymphadenopathy.   Ext:  SCDs BLE.  No significant edema.  No cyanosis Skin: No petechiae / purpurea.  No major sores Musculoskeletal: No severe joint pain.  Good ROM major joints   Results:   Labs: Results for orders placed or performed during the hospital encounter of 07/24/17 (from the past 48 hour(s))  Magnesium     Status: Abnormal   Collection Time: 07/24/17  5:53 PM  Result Value Ref  Range   Magnesium 1.4 (L) 1.7 - 2.4 mg/dL  Comprehensive metabolic panel     Status: Abnormal   Collection Time: 07/25/17  5:00 AM  Result Value Ref Range   Sodium 139 135 - 145 mmol/L   Potassium 3.0 (L) 3.5 - 5.1 mmol/L   Chloride 113 (H) 101 - 111 mmol/L   CO2 20 (L) 22 - 32 mmol/L   Glucose, Bld 134 (H) 65 - 99 mg/dL   BUN 8 6 - 20 mg/dL   Creatinine, Ser 0.74 0.44 - 1.00 mg/dL   Calcium 7.4 (L) 8.9 - 10.3 mg/dL   Total Protein 5.3 (L) 6.5 - 8.1 g/dL   Albumin 2.3 (L) 3.5 - 5.0 g/dL   AST 29 15 - 41 U/L   ALT 55 (H) 14 - 54 U/L   Alkaline Phosphatase 304 (H) 38 - 126 U/L   Total Bilirubin 0.4 0.3 - 1.2 mg/dL   GFR calc non Af Amer >60 >60 mL/min   GFR calc Af Amer >60 >60 mL/min    Comment: (NOTE) The eGFR has been calculated using the CKD EPI equation. This calculation has not been validated in all clinical situations. eGFR's persistently <60 mL/min signify possible Chronic Kidney Disease.    Anion gap 6 5 - 15  CBC WITH DIFFERENTIAL     Status: Abnormal   Collection Time: 07/25/17  5:00 AM  Result Value Ref Range   WBC 3.7 (L) 4.0 - 10.5 K/uL   RBC 2.47 (L) 3.87 - 5.11 MIL/uL   Hemoglobin 6.3 (LL) 12.0 - 15.0 g/dL    Comment: CRITICAL RESULT CALLED TO, READ BACK BY AND VERIFIED WITH: H.RICHARD RN 07/25/2017 0634 JR    HCT 18.7 (L) 36.0 - 46.0 %   MCV 75.7 (L) 78.0 - 100.0 fL   MCH 25.5 (L) 26.0 - 34.0 pg   MCHC 33.7 30.0 - 36.0 g/dL   RDW 18.8 (H) 11.5 - 15.5  %   Platelets 65 (L) 150 - 400 K/uL    Comment: REPEATED TO VERIFY SPECIMEN CHECKED FOR CLOTS PLATELET COUNT CONFIRMED BY SMEAR    Neutrophils Relative % 67 %   Lymphocytes Relative 17 %   Monocytes Relative 13 %   Eosinophils Relative 0 %   Basophils Relative 0 %   Band Neutrophils 2 %   Metamyelocytes Relative 1 %   Neutro Abs 2.6 1.7 - 7.7 K/uL   Lymphs Abs 0.6 (L) 0.7 - 4.0 K/uL   Monocytes Absolute 0.5 0.1 - 1.0 K/uL   Eosinophils Absolute 0.0 0.0 - 0.7 K/uL   Basophils Absolute 0.0 0.0 - 0.1 K/uL   RBC Morphology POLYCHROMASIA PRESENT     Comment: TARGET CELLS    Imaging / Studies: X-ray Chest Pa And Lateral  Result Date: 07/24/2017 CLINICAL DATA:  Initial evaluation for acute fever. History of pancreatic cancer. EXAM: CHEST  2 VIEW COMPARISON:  Prior radiograph from 06/21/2017. FINDINGS: Right-sided Port-A-Cath in place, stable. Cardiac and mediastinal silhouettes are unchanged, and remain within normal limits. Lungs are normally inflated. No focal infiltrates. No pulmonary edema or pleural effusion. No pneumothorax. No acute osseous abnormality. Biliary stent overlies of right upper quadrant. IMPRESSION: No active cardiopulmonary disease. Electronically Signed   By: Jeannine Boga M.D.   On: 07/24/2017 23:53   Ct Abdomen Pelvis W Contrast  Addendum Date: 07/25/2017   ADDENDUM REPORT: 07/25/2017 04:49 ADDENDUM: Findings discussed with the patient's nurse practitioner, Ms. Lynch. Electronically Signed   By: Dorise Bullion III  M.D   On: 07/25/2017 04:49   Result Date: 07/25/2017 CLINICAL DATA:  Pancreatic cancer.  Fever.  Possible abscess. EXAM: CT ABDOMEN AND PELVIS WITH CONTRAST TECHNIQUE: Multidetector CT imaging of the abdomen and pelvis was performed using the standard protocol following bolus administration of intravenous contrast. CONTRAST:  12m ISOVUE-300 IOPAMIDOL (ISOVUE-300) INJECTION 61% COMPARISON:  CT scan February 20, 2017.  MRI May 25, 2017 FINDINGS: Lower  chest: No acute abnormality. Hepatobiliary: Marked intra and extrahepatic biliary duct dilatation is identified. A stent is seen in the common bile duct extending from the liver through the head of the pancreas. Air in the gallbladder is likely secondary to the stent. The intrahepatic portal vein is patent. The extrahepatic portion of the portal vein is occluded as is the proximal splenic vein, secondary to the patient's known pancreatic mass. 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 Liver metastases are identified. A prominent metastasis in the liver on series 2, image 11 measures 2.4 cm. Both lobes appear to be involved. Pancreas: The patient's known pancreatic mass in the neck of the pancreas is again identified. It is difficult to measure but appears to measure at least 3.9 cm. Pancreatic tail is atrophic. Spleen: The spleen measures 15 cm in cranial caudal dimension. Adrenals/Urinary Tract: The right kidney and ureter are unremarkable. The bladder is normal. There is new moderate left hydronephrosis. No stones are seen along the course of the left ureter. No cause for the left-sided hydronephrosis is identified. There is poor excretion from the left kidney on delayed images. The adrenal glands are normal. Stomach/Bowel: The stomach is normal. There is inflammation around the duodenum, secondary to the pancreatic process. The remainder of the small bowel is normal. Fecal loading is seen in the colon. The colon is otherwise normal. There is an abnormality just lateral to the cecum as seen on series 2, image 57. A separate appendix is not visualized on today's study. I am concerned that the abnormality lateral to the cecum may represent a tip appendicitis. However, this is not definitive and a metastatic lesion in the right pericolic gutter is not completely excluded. Vascular/Lymphatic: The patient's known pancreatic malignancy encases the common hepatic artery, the proximal  splenic artery, and the proximal SMA. It also results in occlusion of the main portal and splenic veins. The SMV is also occluded. There is a rind of increased attenuation around the aorta extending to the bifurcation which could represent malignancy or inflammation. It is difficult to see the discrete abnormal nodes in the porta hepatis described on the MRI. The peritoneum and omentum are otherwise normal. Reproductive: Uterus and bilateral adnexa are unremarkable. Other: A small amount of free fluid is seen in the pelvis. Musculoskeletal: No acute or significant osseous findings. IMPRESSION: 1. There is an abnormal enhancing structure with mixed attenuation just lateral to the cecum. I am concerned this is due to appendicitis. A metastasis in the right pericolic gutter is another possibility. 2. New left hydronephrosis. Poor excretion from the left kidney on delayed imaging. An underlying cause is not identified. 3. Metastatic pancreatic cancer as described above. 4. New splenomegaly. Findings being called to the referring clinical team. Electronically Signed: By: DDorise BullionIII M.D On: 07/25/2017 00:38    Medications / Allergies: per chart  Antibiotics: Anti-infectives (From admission, onward)   Start     Dose/Rate Route Frequency Ordered Stop   07/25/17 1800  vancomycin (VANCOCIN) IVPB 750 mg/150 ml premix  750 mg 150 mL/hr over 60 Minutes Intravenous Every 24 hours 07/24/17 1810     07/25/17 0600  ceFEPIme (MAXIPIME) 2 g in dextrose 5 % 50 mL IVPB     2 g 100 mL/hr over 30 Minutes Intravenous Every 8 hours 07/24/17 1810     07/24/17 1830  vancomycin (VANCOCIN) IVPB 1000 mg/200 mL premix     1,000 mg 200 mL/hr over 60 Minutes Intravenous  Once 07/24/17 1810 07/24/17 2052   07/24/17 1830  ceFEPIme (MAXIPIME) 2 g in dextrose 5 % 50 mL IVPB     2 g 100 mL/hr over 30 Minutes Intravenous  Once 07/24/17 1810 07/24/17 2335        Note: Portions of this report may have been transcribed  using voice recognition software. Every effort was made to ensure accuracy; however, inadvertent computerized transcription errors may be present.   Any transcriptional errors that result from this process are unintentional.    Adin Hector, M.D., F.A.C.S. Gastrointestinal and Minimally Invasive Surgery [facility] [address], [place], [place] [address] [phone] Main / Paging   07/25/2017

## 2017-07-25 NOTE — Consult Note (Signed)
Urology Consult Note   Requesting Attending Physician:  Kinnie Feil, MD Service Providing Consult: Urology  Consulting Attending: Lelon Ikard  Reason for Consult:  Left hydronephrosis  HPI: Crystal Carlson is seen in consultation for reasons noted above at the request of Kinnie Feil, MD for evaluation of left hydronephrosis.  This is a 51 y.o. female with stage 4 pancreatic cancer s/p 3 cycles of chemotherapy ( last cycle 11/29), recently admitted for fevers.   Seen in oncology clinic 12/7 and noted to have a fever. Also with subjective fevers and chills. Subsequently admitted for full work up. Started on IV cefepime after blood cultures were obtained.   Labs notable for WBC 2.2, Cr 1.0, K 2.8. U/A negative for LE and nitrites. WBC 0-2,   CT scan revealed new L sided hydronephrosis with no evidence of renal or ureteral stone. Poor excretion on delayed imaging.  Abnormal enhancing structure lateral to cecum.   No CVA tenderness. No dysuria, frequency, urgency or hematuria. No hx of recurrent UTI. No hx of nephrolithiasis   Past Medical History: Past Medical History:  Diagnosis Date  . Cancer of pancreas ([facility]) 05/26/2017  . Chronic pancreatitis ([facility]) 05/19/2017  . Microcytic anemia 05/19/2017  . Prediabetes 2016   when living in [place]    Past Surgical History:  Past Surgical History:  Procedure Laterality Date  . ERCP N/A 06/09/2017   Procedure: ENDOSCOPIC RETROGRADE CHOLANGIOPANCREATOGRAPHY (ERCP);  Surgeon: Clarene Essex, MD;  Location: [facility];  Service: Endoscopy;  Laterality: N/A;  . IR FLUORO GUIDE PORT INSERTION RIGHT  06/04/2017  . IR US GUIDE VASC ACCESS RIGHT  06/04/2017    Medication: Current Facility-Administered Medications  Medication Dose Route Frequency Provider Last Rate Last Dose  . 0.9 %  sodium chloride infusion   Intravenous Once Walden Field A, NP      . ceFEPIme (MAXIPIME) 2 g in dextrose 5 % 50 mL IVPB  2 g Intravenous Q8H Adrian Saran, [place]    Stopped at 07/25/17 3267  . dextrose 5 % and 0.9 % NaCl with KCl 20 mEq/L infusion   Intravenous Continuous Kinnie Feil, MD 125 mL/hr at 07/24/17 2021    . ferrous sulfate tablet 325 mg  325 mg Oral TID WC Buriev, Arie Sabina, MD      . HYDROmorphone (DILAUDID) tablet 2 mg  2 mg Oral Q4H PRN Kinnie Feil, MD      . LORazepam (ATIVAN) tablet 0.5 mg  0.5 mg Oral Q8H PRN Kinnie Feil, MD   0.5 mg at 07/24/17 2044  . pantoprazole (PROTONIX) EC tablet 40 mg  40 mg Oral Daily Kinnie Feil, MD   40 mg at 07/24/17 1852  . potassium chloride SA (K-DUR,KLOR-CON) CR tablet 40 mEq  40 mEq Oral TID Kinnie Feil, MD      . sodium chloride flush (NS) 0.9 % injection 3 mL  3 mL Intravenous Q12H Kinnie Feil, MD   3 mL at 07/24/17 2237  . vancomycin (VANCOCIN) IVPB 750 mg/150 ml premix  750 mg Intravenous Q24H Arlyn Dunning M, RPH        Allergies: No Known Allergies  Social History: Social History   Tobacco Use  . Smoking status: Never Smoker  . Smokeless tobacco: Never Used  Substance Use Topics  . Alcohol use: No  . Drug use: No    Family History Family History  Problem Relation Age of Onset  . Hypertension Mother   . Arthritis  Mother   . Diabetes Father   . Heart disease Father        ?valvular problems  . Hyperlipidemia Son   . Diabetes Brother     Review of Systems 10 systems were reviewed and are negative except as noted specifically in the HPI.  Objective   Vital signs in last 24 hours: BP 112/77 (BP Location: Left Arm)   Pulse 77   Temp 98.4 F (36.9 C) (Oral)   Resp 20   SpO2 100%   Physical Exam General: NAD, A&O, resting, appropriate HEENT: [place]/AT, EOMI, MMM Pulmonary: Normal work of breathing Cardiovascular: HDS, adequate peripheral perfusion Abdomen: Soft, NTTP, nondistended,. GU: No CVA tenderness Extremities: warm and well perfused Neuro: Appropriate, no focal neurological deficits  Most Recent Labs: Lab Results  Component  Value Date   WBC 3.7 (L) 07/25/2017   HGB 6.3 (LL) 07/25/2017   HCT 18.7 (L) 07/25/2017   PLT 65 (L) 07/25/2017    Lab Results  Component Value Date   NA 139 07/25/2017   K 3.0 (L) 07/25/2017   CL 113 (H) 07/25/2017   CO2 20 (L) 07/25/2017   BUN 8 07/25/2017   CREATININE 0.74 07/25/2017   CALCIUM 7.4 (L) 07/25/2017   MG 2.2 07/25/2017    Lab Results  Component Value Date   INR 1.08 06/04/2017   APTT 33 06/04/2017     IMAGING: X-ray Chest Pa And Lateral  Result Date: 07/24/2017 CLINICAL DATA:  Initial evaluation for acute fever. History of pancreatic cancer. EXAM: CHEST  2 VIEW COMPARISON:  Prior radiograph from 06/21/2017. FINDINGS: Right-sided Port-A-Cath in place, stable. Cardiac and mediastinal silhouettes are unchanged, and remain within normal limits. Lungs are normally inflated. No focal infiltrates. No pulmonary edema or pleural effusion. No pneumothorax. No acute osseous abnormality. Biliary stent overlies of right upper quadrant. IMPRESSION: No active cardiopulmonary disease. Electronically Signed   By: Jeannine Boga M.D.   On: 07/24/2017 23:53   Ct Abdomen Pelvis W Contrast  Addendum Date: 07/25/2017   ADDENDUM REPORT: 07/25/2017 04:49 ADDENDUM: Findings discussed with the patient's nurse practitioner, Ms. Lynch. Electronically Signed   By: Dorise Bullion III M.D   On: 07/25/2017 04:49   Result Date: 07/25/2017 CLINICAL DATA:  Pancreatic cancer.  Fever.  Possible abscess. EXAM: CT ABDOMEN AND PELVIS WITH CONTRAST TECHNIQUE: Multidetector CT imaging of the abdomen and pelvis was performed using the standard protocol following bolus administration of intravenous contrast. CONTRAST:  35mL ISOVUE-300 IOPAMIDOL (ISOVUE-300) INJECTION 61% COMPARISON:  CT scan February 20, 2017.  MRI May 25, 2017 FINDINGS: Lower chest: No acute abnormality. Hepatobiliary: Marked intra and extrahepatic biliary duct dilatation is identified. A stent is seen in the common bile duct extending  from the liver through the head of the pancreas. Air in the gallbladder is likely secondary to the stent. The intrahepatic portal vein is patent. The extrahepatic portion of the portal vein is occluded as is the proximal splenic vein, secondary to the patient's known pancreatic mass. 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 Liver metastases are identified. A prominent metastasis in the liver on series 2, image 11 measures 2.4 cm. Both lobes appear to be involved. Pancreas: The patient's known pancreatic mass in the neck of the pancreas is again identified. It is difficult to measure but appears to measure at least 3.9 cm. Pancreatic tail is atrophic. Spleen: The spleen measures 15 cm in cranial caudal dimension. Adrenals/Urinary Tract: The right kidney and ureter are  unremarkable. The bladder is normal. There is new moderate left hydronephrosis. No stones are seen along the course of the left ureter. No cause for the left-sided hydronephrosis is identified. There is poor excretion from the left kidney on delayed images. The adrenal glands are normal. Stomach/Bowel: The stomach is normal. There is inflammation around the duodenum, secondary to the pancreatic process. The remainder of the small bowel is normal. Fecal loading is seen in the colon. The colon is otherwise normal. There is an abnormality just lateral to the cecum as seen on series 2, image 57. A separate appendix is not visualized on today's study. I am concerned that the abnormality lateral to the cecum may represent a tip appendicitis. However, this is not definitive and a metastatic lesion in the right pericolic gutter is not completely excluded. Vascular/Lymphatic: The patient's known pancreatic malignancy encases the common hepatic artery, the proximal splenic artery, and the proximal SMA. It also results in occlusion of the main portal and splenic veins. The SMV is also occluded. There is a rind of increased  attenuation around the aorta extending to the bifurcation which could represent malignancy or inflammation. It is difficult to see the discrete abnormal nodes in the porta hepatis described on the MRI. The peritoneum and omentum are otherwise normal. Reproductive: Uterus and bilateral adnexa are unremarkable. Other: A small amount of free fluid is seen in the pelvis. Musculoskeletal: No acute or significant osseous findings. IMPRESSION: 1. There is an abnormal enhancing structure with mixed attenuation just lateral to the cecum. I am concerned this is due to appendicitis. A metastasis in the right pericolic gutter is another possibility. 2. New left hydronephrosis. Poor excretion from the left kidney on delayed imaging. An underlying cause is not identified. 3. Metastatic pancreatic cancer as described above. 4. New splenomegaly. Findings being called to the referring clinical team. Electronically Signed: By: Dorise Bullion III M.D On: 07/25/2017 00:38    ------  Assessment:  51 y.o. female with stage 4 pancreatic cancer currently on chemotherapy, admitted with leukopenic fever. CT revealed new left side hydronephrosis with no clear source of obstruction. Cr today 0.7. U/A benign   At this time, suspicion for infection from urinary source is low given the benign U/A. The exact etiology of the left hydronephrosis is unknown. On CT the uterus does deviate to the left pelvis and there may be some aspect of external compression. Also, it is possible that metastatic disease could be a contributor.   At this point, given no decline in renal function and benign u/a, we would recommend continued observation. No indication for stent placement or intervention at this time.    Recommendations: - Continue to trend renal function  - Please send Urine culture, if not already sent  - Please contact urology if patient develops worsening signs of infection vs sepsis, as we would then re-consider the need for stent  placement  - Patient will require outpatient follow up with repeat RUS after this hospitalization.    Thank you for this consult. Please contact the urology consult pager with any further questions/concerns.  I have seen the pt, reviewed her history and imaging studies. I agree with the above assessment and plan. If she experiences left flank pain, worsening of kidney function or inadequate infection control will consider stent placement.

## 2017-07-25 NOTE — Progress Notes (Signed)
Patient with temp 100.0 prior to start of blood transfusion. MD called. Per MD start transfusion despite temp. Medicate with tylenol as well. Will continue to monitor.

## 2017-07-25 NOTE — Progress Notes (Signed)
CRITICAL VALUE ALERT  Critical Value:  Hemoglobin 6.3  Date & Time Notied: 12/8  8473  Provider Notified: Donnal Debar  Orders Received/Actions taken: Awaiting orders

## 2017-07-26 DIAGNOSIS — R5081 Fever presenting with conditions classified elsewhere: Secondary | ICD-10-CM

## 2017-07-26 DIAGNOSIS — D709 Neutropenia, unspecified: Principal | ICD-10-CM

## 2017-07-26 LAB — URINE CULTURE: CULTURE: NO GROWTH

## 2017-07-26 MED ORDER — POTASSIUM CHLORIDE CRYS ER 20 MEQ PO TBCR
40.0000 meq | EXTENDED_RELEASE_TABLET | Freq: Once | ORAL | Status: AC
  Administered 2017-07-26: 40 meq via ORAL
  Filled 2017-07-26: qty 2

## 2017-07-26 NOTE — Progress Notes (Signed)
Assessment Principal Problem:   Neutropenic fever ([facility])   ? Appendicitis-no RLQ or right flank pain or tenderness, feels better after starting a diet; clinical findings are not consistent with acute appendicitis   Plan:  We will be available if needed.   LOS: 2 days        Chief Complaint/Subjective: Feels better after eating.  No RLQ pain.  Objective: Vital signs in last 24 hours: Temp:  [97.8 F (36.6 C)-100 F (37.8 C)] 98.6 F (37 C) (12/09 0442) Pulse Rate:  [74-92] 76 (12/09 0442) Resp:  [18-20] 18 (12/09 0442) BP: (123-134)/(68-98) 123/68 (12/09 0442) SpO2:  [100 %] 100 % (12/09 0442) Last BM Date: 07/25/17  Intake/Output from previous day: 12/08 0701 - 12/09 0700 In: 1719.2 [I.V.:1284.2; Blood:335; IV Piggyback:100] Out: 1850 [Urine:1850] Intake/Output this shift: No intake/output data recorded.  PE: General- In NAD.  Awake and alert. Abdomen-soft, no RLQ or right flank tenderness, minimal suprapubic and LLQ tenderness  Lab Results:  Recent Labs    07/24/17 0847 07/25/17 0500  WBC 2.2* 3.7*  HGB 8.2* 6.3*  HCT 25.3* 18.7*  PLT 96* 65*   BMET Recent Labs    07/24/17 0847 07/25/17 0500  NA 133* 139  K 2.8* 3.0*  CL  --  113*  CO2 21* 20*  GLUCOSE 147* 134*  BUN 12.0 8  CREATININE 1.0 0.74  CALCIUM 8.8 7.4*   PT/INR No results for input(s): LABPROT, INR in the last 72 hours. Comprehensive Metabolic Panel:    Component Value Date/Time   NA 139 07/25/2017 0500   NA 133 (L) 07/24/2017 0847   NA 134 (L) 07/23/2017 1558   K 3.0 (L) 07/25/2017 0500   K 2.8 (LL) 07/24/2017 0847   K 3.6 07/23/2017 1558   CL 113 (H) 07/25/2017 0500   CL 93 (L) 06/21/2017 0220   CO2 20 (L) 07/25/2017 0500   CO2 21 (L) 07/24/2017 0847   CO2 20 (L) 07/23/2017 1558   BUN 8 07/25/2017 0500   BUN 12.0 07/24/2017 0847   BUN 10.4 07/23/2017 1558   CREATININE 0.74 07/25/2017 0500   CREATININE 1.0 07/24/2017 0847   CREATININE 1.0 07/23/2017 1558   GLUCOSE 134 (H)  07/25/2017 0500   GLUCOSE 147 (H) 07/24/2017 0847   GLUCOSE 167 (H) 07/23/2017 1558   CALCIUM 7.4 (L) 07/25/2017 0500   CALCIUM 8.8 07/24/2017 0847   CALCIUM 8.8 07/23/2017 1558   AST 29 07/25/2017 0500   AST 43 (H) 07/24/2017 0847   AST 57 (H) 07/23/2017 1558   ALT 55 (H) 07/25/2017 0500   ALT 92 (H) 07/24/2017 0847   ALT 106 (H) 07/23/2017 1558   ALKPHOS 304 (H) 07/25/2017 0500   ALKPHOS 494 (H) 07/24/2017 0847   ALKPHOS 521 (H) 07/23/2017 1558   BILITOT 0.4 07/25/2017 0500   BILITOT 0.96 07/24/2017 0847   BILITOT 0.81 07/23/2017 1558   PROT 5.3 (L) 07/25/2017 0500   PROT 7.1 07/24/2017 0847   PROT 6.8 07/23/2017 1558   ALBUMIN 2.3 (L) 07/25/2017 0500   ALBUMIN 2.9 (L) 07/24/2017 0847   ALBUMIN 2.9 (L) 07/23/2017 1558     Studies/Results: X-ray Chest Pa And Lateral  Result Date: 07/24/2017 CLINICAL DATA:  Initial evaluation for acute fever. History of pancreatic cancer. EXAM: CHEST  2 VIEW COMPARISON:  Prior radiograph from 06/21/2017. FINDINGS: Right-sided Port-A-Cath in place, stable. Cardiac and mediastinal silhouettes are unchanged, and remain within normal limits. Lungs are normally inflated. No focal infiltrates. No pulmonary edema or  pleural effusion. No pneumothorax. No acute osseous abnormality. Biliary stent overlies of right upper quadrant. IMPRESSION: No active cardiopulmonary disease. Electronically Signed   By: Jeannine Boga M.D.   On: 07/24/2017 23:53   Ct Abdomen Pelvis W Contrast  Addendum Date: 07/25/2017   ADDENDUM REPORT: 07/25/2017 04:49 ADDENDUM: Findings discussed with the patient's nurse practitioner, Ms. Lynch. Electronically Signed   By: Dorise Bullion III M.D   On: 07/25/2017 04:49   Result Date: 07/25/2017 CLINICAL DATA:  Pancreatic cancer.  Fever.  Possible abscess. EXAM: CT ABDOMEN AND PELVIS WITH CONTRAST TECHNIQUE: Multidetector CT imaging of the abdomen and pelvis was performed using the standard protocol following bolus administration of  intravenous contrast. CONTRAST:  5mL ISOVUE-300 IOPAMIDOL (ISOVUE-300) INJECTION 61% COMPARISON:  CT scan February 20, 2017.  MRI May 25, 2017 FINDINGS: Lower chest: No acute abnormality. Hepatobiliary: Marked intra and extrahepatic biliary duct dilatation is identified. A stent is seen in the common bile duct extending from the liver through the head of the pancreas. Air in the gallbladder is likely secondary to the stent. The intrahepatic portal vein is patent. The extrahepatic portion of the portal vein is occluded as is the proximal splenic vein, secondary to the patient's known pancreatic mass. 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 Liver metastases are identified. A prominent metastasis in the liver on series 2, image 11 measures 2.4 cm. Both lobes appear to be involved. Pancreas: The patient's known pancreatic mass in the neck of the pancreas is again identified. It is difficult to measure but appears to measure at least 3.9 cm. Pancreatic tail is atrophic. Spleen: The spleen measures 15 cm in cranial caudal dimension. Adrenals/Urinary Tract: The right kidney and ureter are unremarkable. The bladder is normal. There is new moderate left hydronephrosis. No stones are seen along the course of the left ureter. No cause for the left-sided hydronephrosis is identified. There is poor excretion from the left kidney on delayed images. The adrenal glands are normal. Stomach/Bowel: The stomach is normal. There is inflammation around the duodenum, secondary to the pancreatic process. The remainder of the small bowel is normal. Fecal loading is seen in the colon. The colon is otherwise normal. There is an abnormality just lateral to the cecum as seen on series 2, image 57. A separate appendix is not visualized on today's study. I am concerned that the abnormality lateral to the cecum may represent a tip appendicitis. However, this is not definitive and a metastatic lesion in the  right pericolic gutter is not completely excluded. Vascular/Lymphatic: The patient's known pancreatic malignancy encases the common hepatic artery, the proximal splenic artery, and the proximal SMA. It also results in occlusion of the main portal and splenic veins. The SMV is also occluded. There is a rind of increased attenuation around the aorta extending to the bifurcation which could represent malignancy or inflammation. It is difficult to see the discrete abnormal nodes in the porta hepatis described on the MRI. The peritoneum and omentum are otherwise normal. Reproductive: Uterus and bilateral adnexa are unremarkable. Other: A small amount of free fluid is seen in the pelvis. Musculoskeletal: No acute or significant osseous findings. IMPRESSION: 1. There is an abnormal enhancing structure with mixed attenuation just lateral to the cecum. I am concerned this is due to appendicitis. A metastasis in the right pericolic gutter is another possibility. 2. New left hydronephrosis. Poor excretion from the left kidney on delayed imaging. An underlying cause is not identified. 3.  Metastatic pancreatic cancer as described above. 4. New splenomegaly. Findings being called to the referring clinical team. Electronically Signed: By: Dorise Bullion III M.D On: 07/25/2017 00:38    Anti-infectives: Anti-infectives (From admission, onward)   Start     Dose/Rate Route Frequency Ordered Stop   07/25/17 1800  vancomycin (VANCOCIN) IVPB 750 mg/150 ml premix     750 mg 150 mL/hr over 60 Minutes Intravenous Every 24 hours 07/24/17 1810     07/25/17 0600  ceFEPIme (MAXIPIME) 2 g in dextrose 5 % 50 mL IVPB     2 g 100 mL/hr over 30 Minutes Intravenous Every 8 hours 07/24/17 1810     07/24/17 1830  vancomycin (VANCOCIN) IVPB 1000 mg/200 mL premix     1,000 mg 200 mL/hr over 60 Minutes Intravenous  Once 07/24/17 1810 07/24/17 2052   07/24/17 1830  ceFEPIme (MAXIPIME) 2 g in dextrose 5 % 50 mL IVPB     2 g 100 mL/hr over  30 Minutes Intravenous  Once 07/24/17 1810 07/24/17 2335       Crystal Carlson 07/26/2017

## 2017-07-26 NOTE — Progress Notes (Signed)
Specimen for c diff testing sent but unable to be processed, stool is formed.  MD notified and order for testing and contact discontinued.

## 2017-07-26 NOTE — Progress Notes (Signed)
[facility] PROGRESS NOTE  Crystal Carlson KXF:[id number] DOB: 1966/01/23 DOA: 07/24/2017   PCP: Mack Hook, MD  Brief summary  51 y.o. 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  Assessment/Plan:  Neutropenic fever - ct abd with abnormal enhancing structure with mixed attenuation just lateral to the cecum. ?  appendicitis. Vs metastasis. 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 Ct abdomen showed possible new metastasis - outpatient follow up   Pancytopenia - from chemo - has gotten one unit PRBC 12/08 - CBC pending for AM  Left hydronephrosis.  - Ct abd: New left hydronephrosis. Poor excretion from the left kidney on delayed imaging - renal function stable - urology consulted   Code Status: full  Family Communication: pt and mother at bedside  Disposition Plan: home in 24 hours if blood work stable and pt clinically stable    Consultants:  Surgery   Procedures:  none  Antibiotics: Anti-infectives (From admission, onward)   Start     Dose/Rate Route Frequency Ordered Stop   07/25/17 1800  vancomycin (VANCOCIN) IVPB 750 mg/150 ml premix     750 mg 150 mL/hr over 60 Minutes Intravenous Every 24 hours 07/24/17 1810     07/25/17 0600  ceFEPIme (MAXIPIME) 2 g in dextrose 5 % 50 mL IVPB     2 g 100 mL/hr over 30 Minutes Intravenous Every 8 hours 07/24/17 1810     07/24/17 1830  vancomycin (VANCOCIN) IVPB 1000 mg/200 mL premix     1,000 mg 200 mL/hr over 60 Minutes Intravenous  Once 07/24/17 1810 07/24/17 2052   07/24/17 1830  ceFEPIme (MAXIPIME) 2 g in dextrose 5 % 50 mL IVPB     2 g 100 mL/hr over 30 Minutes Intravenous  Once 07/24/17 1810 07/24/17 2335     HPI/Subjective: Pt reports feeling better.   Objective: Vitals:   07/25/17 2133 07/26/17 0442  BP: 129/86 123/68  Pulse: 74 76  Resp: 18 18  Temp: 97.8 F (36.6 C) 98.6 F (37 C)  SpO2: 100% 100%    Intake/Output Summary (Last 24 hours) at 07/26/2017 1130 Last data filed at 07/25/2017 2132 Gross per 24 hour  Intake 1719.17 ml  Output 1050 ml  Net 669.17 ml   Physical Exam  Constitutional: Appears calm, NAD CVS: RRR, S1/S2 +, no murmurs, no gallops, no carotid bruit.  Pulmonary: Effort and breath sounds normal, no stridor, rhonchi, wheezes, rales.  Abdominal: Soft. BS +,  no distension, tenderness in lower abd quadrants is minimal, no rebound or guarding.  Musculoskeletal: Normal range of motion. No edema and no tenderness.  Data Reviewed: Basic Metabolic Panel: Recent Labs  Lab 07/23/17 1558 07/24/17 0847 07/24/17 1753 07/25/17 0500  NA 134* 133*  --  139  K 3.6 2.8*  --  3.0*  CL  --   --   --  113*  CO2 20* 21*  --  20*  GLUCOSE 167* 147*  --  134*  BUN 10.4 12.0  --  8  CREATININE 1.0 1.0  --  0.74  CALCIUM 8.8 8.8  --  7.4*  MG  --   --  1.4* 2.2   Liver Function Tests: Recent Labs  Lab 07/23/17 1558  07/24/17 0847 07/25/17 0500  AST 57* 43* 29  ALT 106* 92* 55*  ALKPHOS 521* 494* 304*  BILITOT 0.81 0.96 0.4  PROT 6.8 7.1 5.3*  ALBUMIN 2.9* 2.9* 2.3*   CBC: Recent Labs  Lab 07/23/17 1519 07/24/17 0847 07/25/17 0500  WBC 2.4* 2.2* 3.7*  NEUTROABS 1.7 1.8 2.6  HGB 8.3* 8.2* 6.3*  HCT 26.0* 25.3* 18.7*  MCV 76.7* 74.4* 75.7*  PLT 95* 96* 65*     Recent Results (from the past 240 hour(s))  Culture, Blood     Status: None (Preliminary result)   Collection Time: 07/23/17  4:59 PM  Result Value Ref Range Status   BLOOD CULTURE, ROUTINE Preliminary report  Preliminary   Organism ID, Bacteria Comment  Preliminary    Comment: No growth in 36 - 48 hours.  Urine Culture     Status: None   Collection Time: 07/23/17  6:01 PM  Result Value Ref Range Status   Specimen Description URINE, RANDOM  Final   Special Requests NONE  Final   Culture   Final    NO GROWTH Performed at [facility], [address], [place], [place] [address]    Report Status 07/25/2017 FINAL  Final  Urine Culture     Status: None   Collection Time: 07/24/17  8:47 AM  Result Value Ref Range Status   Urine Culture, Routine Final report  Final   Organism ID, Bacteria No growth  Final  Urine culture     Status: None   Collection Time: 07/24/17  5:41 PM  Result Value Ref Range Status   Specimen Description URINE, CLEAN CATCH  Final   Special Requests NONE  Final   Culture   Final    NO GROWTH Performed at [facility], [place] [address] [place],  [address]    Report Status 07/26/2017 FINAL  Final     Studies: X-ray Chest Pa And Lateral  Result Date: 07/24/2017 CLINICAL DATA:  Initial evaluation for acute fever. History of pancreatic cancer. EXAM: CHEST  2 VIEW COMPARISON:  Prior radiograph from 06/21/2017. FINDINGS: Right-sided Port-A-Cath in place, stable. Cardiac and mediastinal silhouettes are unchanged, and remain within normal limits. Lungs are normally inflated. No focal  infiltrates. No pulmonary edema or pleural effusion. No pneumothorax. No acute osseous abnormality. Biliary stent overlies of right upper quadrant. IMPRESSION: No active cardiopulmonary disease. Electronically Signed   By: Jeannine Boga M.D.   On: 07/24/2017 23:53   Ct Abdomen Pelvis W Contrast  Addendum Date: 07/25/2017   ADDENDUM REPORT: 07/25/2017 04:49 ADDENDUM: Findings discussed with the patient's nurse practitioner, Ms. Lynch. Electronically Signed   By: Dorise Bullion III M.D   On: 07/25/2017 04:49   Result Date: 07/25/2017 CLINICAL DATA:  Pancreatic cancer.  Fever.  Possible abscess. EXAM: CT ABDOMEN AND PELVIS WITH CONTRAST TECHNIQUE: Multidetector CT imaging  of the abdomen and pelvis was performed using the standard protocol following bolus administration of intravenous contrast. CONTRAST:  73mL ISOVUE-300 IOPAMIDOL (ISOVUE-300) INJECTION 61% COMPARISON:  CT scan February 20, 2017.  MRI May 25, 2017 FINDINGS: Lower chest: No acute abnormality. Hepatobiliary: Marked intra and extrahepatic biliary duct dilatation is identified. A stent is seen in the common bile duct extending from the liver through the head of the pancreas. Air in the gallbladder is likely secondary to the stent. The intrahepatic portal vein is patent. The extrahepatic portion of the portal vein is occluded as is the proximal splenic vein, secondary to the patient's known pancreatic mass. 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 Liver metastases are identified. A prominent metastasis in the liver on series 2, image 11 measures 2.4 cm. Both lobes appear to be involved. Pancreas: The patient's known pancreatic mass in the neck of the pancreas is again identified. It is difficult to measure but appears to measure at least 3.9 cm. Pancreatic tail is atrophic. Spleen: The spleen measures 15 cm in cranial caudal dimension. Adrenals/Urinary Tract: The right kidney and ureter are unremarkable. The  bladder is normal. There is new moderate left hydronephrosis. No stones are seen along the course of the left ureter. No cause for the left-sided hydronephrosis is identified. There is poor excretion from the left kidney on delayed images. The adrenal glands are normal. Stomach/Bowel: The stomach is normal. There is inflammation around the duodenum, secondary to the pancreatic process. The remainder of the small bowel is normal. Fecal loading is seen in the colon. The colon is otherwise normal. There is an abnormality just lateral to the cecum as seen on series 2, image 57. A separate appendix is not visualized on today's study. I am concerned that the abnormality lateral to the cecum may represent a tip appendicitis. However, this is not definitive and a metastatic lesion in the right pericolic gutter is not completely excluded. Vascular/Lymphatic: The patient's known pancreatic malignancy encases the common hepatic artery, the proximal splenic artery, and the proximal SMA. It also results in occlusion of the main portal and splenic veins. The SMV is also occluded. There is a rind of increased attenuation around the aorta extending to the bifurcation which could represent malignancy or inflammation. It is difficult to see the discrete abnormal nodes in the porta hepatis described on the MRI. The peritoneum and omentum are otherwise normal. Reproductive: Uterus and bilateral adnexa are unremarkable. Other: A small amount of free fluid is seen in the pelvis. Musculoskeletal: No acute or significant osseous findings. IMPRESSION: 1. There is an abnormal enhancing structure with mixed attenuation just lateral to the cecum. I am concerned this is due to appendicitis. A metastasis in the right pericolic gutter is another possibility. 2. New left hydronephrosis. Poor excretion from the left kidney on delayed imaging. An underlying cause is not identified. 3. Metastatic pancreatic cancer as described above. 4. New  splenomegaly. Findings being called to the referring clinical team. Electronically Signed: By: Dorise Bullion III M.D On: 07/25/2017 00:38    Scheduled Meds: . ferrous sulfate  325 mg Oral TID WC  . lip balm  1 application Topical BID  . pantoprazole  40 mg Oral Daily  . psyllium  1 packet Oral Daily  . saccharomyces boulardii  250 mg Oral BID  . sodium chloride flush  3 mL Intravenous Q12H   Continuous Infusions: . sodium chloride    . ceFEPime (MAXIPIME) IV Stopped (07/26/17  9562)  . dextrose 5 % and 0.9 % NaCl with KCl 20 mEq/L 50 mL/hr at 07/25/17 1318  . lactated ringers    . vancomycin Stopped (07/25/17 1824)    Time spent: 25 minutes   [facility] Pager [phone]. If 7PM-7AM, please contact night-coverage at [email or web address], password [facility] 07/26/2017, 11:30 AM  LOS: 2 days

## 2017-07-27 DIAGNOSIS — C787 Secondary malignant neoplasm of liver and intrahepatic bile duct: Secondary | ICD-10-CM

## 2017-07-27 DIAGNOSIS — C257 Malignant neoplasm of other parts of pancreas: Secondary | ICD-10-CM

## 2017-07-27 DIAGNOSIS — R109 Unspecified abdominal pain: Secondary | ICD-10-CM

## 2017-07-27 DIAGNOSIS — D696 Thrombocytopenia, unspecified: Secondary | ICD-10-CM

## 2017-07-27 LAB — COMPREHENSIVE METABOLIC PANEL
ALBUMIN: 2.7 g/dL — AB (ref 3.5–5.0)
ALT: 55 U/L — AB (ref 14–54)
AST: 43 U/L — AB (ref 15–41)
Alkaline Phosphatase: 405 U/L — ABNORMAL HIGH (ref 38–126)
Anion gap: 6 (ref 5–15)
BUN: 9 mg/dL (ref 6–20)
CHLORIDE: 109 mmol/L (ref 101–111)
CO2: 22 mmol/L (ref 22–32)
CREATININE: 0.79 mg/dL (ref 0.44–1.00)
Calcium: 8.9 mg/dL (ref 8.9–10.3)
GFR calc Af Amer: >60 mL/min (ref >60)
GLUCOSE: 107 mg/dL — AB (ref 65–99)
POTASSIUM: 3.6 mmol/L (ref 3.5–5.1)
SODIUM: 137 mmol/L (ref 135–145)
Total Bilirubin: 0.4 mg/dL (ref 0.3–1.2)
Total Protein: 6 g/dL — ABNORMAL LOW (ref 6.5–8.1)

## 2017-07-27 LAB — CBC
HEMATOCRIT: 26.4 % — AB (ref 36.0–46.0)
Hemoglobin: 8.8 g/dL — ABNORMAL LOW (ref 12.0–15.0)
MCH: 26 pg (ref 26.0–34.0)
MCHC: 33.3 g/dL (ref 30.0–36.0)
MCV: 77.9 fL — AB (ref 78.0–100.0)
Platelets: 103 10*3/uL — ABNORMAL LOW (ref 150–400)
RBC: 3.39 MIL/uL — ABNORMAL LOW (ref 3.87–5.11)
RDW: 20.2 % — AB (ref 11.5–15.5)
WBC: 16 10*3/uL — AB (ref 4.0–10.5)

## 2017-07-27 LAB — TYPE AND SCREEN
ABO/RH(D): O POS
ANTIBODY SCREEN: NEGATIVE
UNIT DIVISION: 0

## 2017-07-27 LAB — BPAM RBC
BLOOD PRODUCT EXPIRATION DATE: 201901022359
ISSUE DATE / TIME: 201812081826
Unit Type and Rh: 5100

## 2017-07-27 MED ORDER — HYDROMORPHONE HCL 4 MG PO TABS: 4.0000 mg | ORAL_TABLET | ORAL | 0 refills | Status: DC | PRN

## 2017-07-27 MED ORDER — MORPHINE SULFATE ER 15 MG PO TBCR: 15.0000 mg | EXTENDED_RELEASE_TABLET | Freq: Two times a day (BID) | ORAL | Status: DC | PRN

## 2017-07-27 MED ORDER — HYDROCORTISONE 2.5 % RE CREA: 1.0000 "application " | TOPICAL_CREAM | Freq: Four times a day (QID) | RECTAL | 0 refills | Status: DC | PRN

## 2017-07-27 MED ORDER — HYDROCORTISONE 1 % EX CREA: 1.0000 "application " | TOPICAL_CREAM | Freq: Three times a day (TID) | CUTANEOUS | 0 refills | Status: AC | PRN

## 2017-07-27 MED ORDER — POLYETHYLENE GLYCOL 3350 17 G PO PACK: 17.0000 g | PACK | Freq: Every day | ORAL | Status: DC | PRN

## 2017-07-27 MED FILL — SM HYDROCORTISONE-ALOE 1% C: 1 | 10 days supply | Qty: 28 | Fill #0

## 2017-07-27 MED FILL — PROCTOZONE-HC 2.5 % CREA: 2.5 | 7 days supply | Qty: 30 | Fill #0

## 2017-07-27 NOTE — Progress Notes (Signed)
Patient discharged to home, all discharge medications and instructions reviewed and questions answered.  Patient to be assisted to vehicle by wheelchair.  

## 2017-07-27 NOTE — Discharge Instructions (Signed)

## 2017-07-27 NOTE — Progress Notes (Signed)
Pharmacy Antibiotic Note  Crystal Carlson is a 51 y.o. female admitted on 07/24/2017 with FN.  Pharmacy consulted for vanc/cefepime dosing on 12/7.   Plan: Day 4 Abx's 1) Patient afebrile, WBC now elevated, cultures remain negative - ? Discontinue antibiotics and at least discontinue vancomycin 2) Continue current Vanc and cefepime dosing for now    Temp (24hrs), Avg:98.5 F (36.9 C), Min:98.1 F (36.7 C), Max:98.8 F (37.1 C)  Recent Labs  Lab 07/23/17 1519 07/23/17 1558 07/24/17 0847 07/24/17 0847 07/25/17 0500 07/27/17 0537  WBC 2.4*  --  2.2*  --  3.7* 16.0*  CREATININE  --  1.0  --  1.0 0.74 0.79    Estimated Creatinine Clearance: 72.2 mL/min (by C-G formula based on SCr of 0.79 mg/dL).    No Known Allergies   Thank you for allowing pharmacy to be a part of this patient's care.   Adrian Saran, PharmD, BCPS Pager [phone] 07/27/2017 8:05 AM

## 2017-07-27 NOTE — Discharge Summary (Signed)
Physician Discharge Summary  Crystal Carlson SNK:[id number] DOB: 08/11/1966 DOA: 07/24/2017  PCP: Mack Hook, MD  Admit date: 07/24/2017 Discharge date: 07/27/2017  Recommendations for Outpatient Follow-up:  1. Pt will need to follow up with PCP in 2-3 weeks post discharge 2. Please obtain BMP to evaluate electrolytes and kidney function 3. Please also check CBC to evaluate Hg and Hct levels   Discharge Diagnoses:  Principal Problem:   Neutropenic fever ([place]) Active Problems:   HTN (hypertension)   Hepatic metastasis (HCC)   Pancreatic cancer metastasized to liver [facility])   Chronic pain due to malignant neoplastic disease   Antineoplastic chemotherapy induced pancytopenia (HCC)   Protein-calorie malnutrition, moderate ([place])  Discharge Condition: Stable  Diet recommendation: Heart healthy diet discussed in details   History of present illness:  51 y.o.femalewith stage iv pancreatic cancerinvolvingpancreas neck mass with encasement of the hepatic/splenic/superior mesenteric arteries, liver lesions, 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   Assessment/Plan:  Fever. Leukopenia s/p chemo. unclear source of fever at this time. ct abdomen showed an abnormal enhancing structure with mixed attenuation just lateral to the cecum. questionable appendicitis. Vs metastasis.Surgery consulted, no indication for an intervention, per oncology, OK to stop ABX. Pt tolerating diet well and wants to go home. Surgery team cleared for discharge.  Hypokalemia, hypomagnesemia. 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 Ct abdomen showed possible new metastasis. Outpatient follow up with oncology.   Cytopenia. Anemia, thrombocytopenia likely due to chemo. No s/s of acute bleeding. Outpatient follow up.   Left hydronephrosis. Ct abd: New left hydronephrosis. Poor excretion from the left kidney on delayed imaging. An underlying cause is not identified.  - renal function stable, will need urology follow up in an outpatient setting.   Code Status: full  Family Communication: updated pt and her mother at bedside  Disposition Plan: home    Consultants:  Surgery   Procedures:  none    Procedures/Studies: X-ray Chest Pa And Lateral  Result Date: 07/24/2017 CLINICAL DATA:  Initial evaluation for acute fever. History of pancreatic cancer. EXAM: CHEST  2 VIEW COMPARISON:  Prior radiograph from 06/21/2017. FINDINGS: Right-sided Port-A-Cath in place, stable. Cardiac and mediastinal silhouettes are unchanged, and remain within normal limits. Lungs are normally inflated. No focal infiltrates. No pulmonary edema or pleural effusion. No pneumothorax. No acute osseous abnormality. Biliary stent overlies of right upper quadrant. IMPRESSION: No active cardiopulmonary disease. Electronically Signed   By: Jeannine Boga M.D.   On: 07/24/2017 23:53   Ct Abdomen Pelvis W Contrast  Addendum Date: 07/25/2017   ADDENDUM REPORT: 07/25/2017 04:49 ADDENDUM: Findings discussed with the patient's nurse practitioner, Ms. Lynch. Electronically Signed   By: Dorise Bullion III M.D   On: 07/25/2017 04:49   Result Date: 07/25/2017 CLINICAL DATA:  Pancreatic cancer.  Fever.  Possible abscess. EXAM: CT ABDOMEN AND PELVIS WITH CONTRAST TECHNIQUE: Multidetector CT imaging  of the abdomen and pelvis was performed using the standard protocol following bolus administration of intravenous contrast. CONTRAST:  62mL ISOVUE-300 IOPAMIDOL (ISOVUE-300) INJECTION 61% COMPARISON:  CT  scan February 20, 2017.  MRI May 25, 2017 FINDINGS: Lower chest: No acute abnormality. Hepatobiliary: Marked intra and extrahepatic biliary duct dilatation is identified. A stent is seen in the common bile duct extending from the liver through the head of the pancreas. Air in the gallbladder is likely secondary to the stent. The intrahepatic portal vein is patent. The extrahepatic portion of the portal vein is occluded as is the proximal splenic vein, secondary to the patient's known pancreatic mass. 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 Liver metastases are identified. A prominent metastasis in the liver on series 2, image 11 measures 2.4 cm. Both lobes appear to be involved. Pancreas: The patient's known pancreatic mass in the neck of the pancreas is again identified. It is difficult to measure but appears to measure at least 3.9 cm. Pancreatic tail is atrophic. Spleen: The spleen measures 15 cm in cranial caudal dimension. Adrenals/Urinary Tract: The right kidney and ureter are unremarkable. The bladder is normal. There is new moderate left hydronephrosis. No stones are seen along the course of the left ureter. No cause for the left-sided hydronephrosis is identified. There is poor excretion from the left kidney on delayed images. The adrenal glands are normal. Stomach/Bowel: The stomach is normal. There is inflammation around the duodenum, secondary to the pancreatic process. The remainder of the small bowel is normal. Fecal loading is seen in the colon. The colon is otherwise normal. There is an abnormality just lateral to the cecum as seen on series 2, image 57. A separate appendix is not visualized on today's study. I am concerned that the abnormality lateral to the  cecum may represent a tip appendicitis. However, this is not definitive and a metastatic lesion in the right pericolic gutter is not completely excluded. Vascular/Lymphatic: The patient's known pancreatic malignancy encases the common hepatic artery, the proximal splenic artery, and the proximal SMA. It also results in occlusion of the main portal and splenic veins. The SMV is also occluded. There is a rind of increased attenuation around the aorta extending to the bifurcation which could represent malignancy or inflammation. It is difficult to see the discrete abnormal nodes in the porta hepatis described on the MRI. The peritoneum and omentum are otherwise normal. Reproductive: Uterus and bilateral adnexa are unremarkable. Other: A small amount of free fluid is seen in the pelvis. Musculoskeletal: No acute or significant osseous findings. IMPRESSION: 1. There is an abnormal enhancing structure with mixed attenuation just lateral to the cecum. I am concerned this is due to appendicitis. A metastasis in the right pericolic gutter is another possibility. 2. New left hydronephrosis. Poor excretion from the left kidney on delayed imaging. An underlying cause is not identified. 3. Metastatic pancreatic cancer as described above. 4. New splenomegaly. Findings being called to the referring clinical team. Electronically Signed: By: Dorise Bullion III M.D On: 07/25/2017 00:38     Discharge Exam: Vitals:   07/26/17 2330 07/27/17 0652  BP: (!) 127/92 129/90  Pulse: 78 85  Resp: 18 20  Temp: 98.6 F (37 C) 98.1 F (36.7 C)  SpO2: 100% 100%   Vitals:   07/26/17 1451 07/26/17 1603 07/26/17 2330 07/27/17 0652  BP: (!) 135/104 128/88 (!) 127/92 129/90  Pulse: 90  78 85  Resp: 18  18 20   Temp: 98.8 F (37.1 C)  98.6 F (37 C) 98.1 F (36.7 C)  TempSrc: Oral  Oral Oral  SpO2: 100%  100% 100%  General: Pt is alert, follows commands appropriately, not in acute distress Cardiovascular: Regular rate and  rhythm, S1/S2 +, no murmurs, no rubs, no gallops Respiratory: Clear to auscultation bilaterally, no wheezing, no crackles, no rhonchi Abdominal: Soft, non tender, non distended, bowel sounds +, no guarding Extremities: no edema, no cyanosis, pulses palpable bilaterally DP and PT Neuro: Grossly nonfocal  Discharge Instructions  Discharge Instructions    Diet - low sodium heart healthy   Complete by:  As directed    Increase activity slowly   Complete by:  As directed      Allergies as of 07/27/2017   No Known Allergies     Medication List    STOP taking these medications   ciprofloxacin 500 MG tablet Commonly known as:  CIPRO   dexamethasone 4 MG tablet Commonly known as:  DECADRON   docusate sodium 100 MG capsule Commonly known as:  COLACE   IMODIUM A-D 2 MG capsule Generic drug:  loperamide   sorbitol 70 % solution     TAKE these medications   cetirizine 10 MG tablet Commonly known as:  ZYRTEC Take 10 mg by mouth See admin instructions. Takes for 3 days follow chemotherapy   ferrous sulfate 325 (65 FE) MG EC tablet Take 1 tablet (325 mg total) by mouth 3 (three) times daily with meals.   hydrocortisone 2.5 % rectal cream Commonly known as:  ANUSOL-HC Apply 1 application topically 4 (four) times daily as needed for hemorrhoids.   hydrocortisone cream 1 % Apply 1 application topically 3 (three) times daily as needed for itching (minor skin irritation).   HYDROmorphone 4 MG tablet Commonly known as:  DILAUDID Take 1-2 tablets (4-8 mg total) by mouth every 4 (four) hours as needed for severe pain.   lidocaine-prilocaine cream Commonly known as:  EMLA Apply to port site one hour prior to use. Do not rub in. Cover with plastic. What changed:    how much to take  how to take this  when to take this  reasons to take this  additional instructions   LORazepam 0.5 MG tablet Commonly known as:  ATIVAN Take 1 tablet (0.5 mg total) by mouth every 8 (eight)  hours as needed for anxiety (or nausea).   morphine 15 MG 12 hr tablet Commonly known as:  MS CONTIN Take 1 tablet (15 mg total) by mouth every 12 (twelve) hours as needed for pain (breakthrough pain). What changed:    when to take this  reasons to take this   pantoprazole 40 MG tablet Commonly known as:  PROTONIX Take 1 tablet (40 mg total) by mouth daily.   polyethylene glycol packet Commonly known as:  MIRALAX / GLYCOLAX Take 17 g by mouth daily as needed for mild constipation or moderate constipation.   potassium chloride SA 20 MEQ tablet Commonly known as:  K-DUR,KLOR-CON Take 1 tablet (20 mEq total) by mouth daily.   prochlorperazine 10 MG tablet Commonly known as:  COMPAZINE Take 1 tablet (10 mg total) by mouth every 6 (six) hours as needed for nausea or vomiting.      Follow-up Information    Mack Hook, MD Follow up.   Specialty:  Internal Medicine Contact information: [place] [address] [phone]        Theodis Blaze, MD Follow up.   Specialty:  Internal Medicine Why:  call my cell phone with any questions please [phone] Contact information: [address] [place] [address]  [phone]            The results of significant diagnostics from this hospitalization (including imaging, microbiology, ancillary and laboratory) are listed below for reference.     Microbiology: Recent Results (from the past 240 hour(s))  Culture, Blood     Status: None (Preliminary result)   Collection Time: 07/23/17  4:59 PM  Result Value Ref Range Status   BLOOD CULTURE, ROUTINE Preliminary report  Preliminary   Organism ID, Bacteria Comment  Preliminary    Comment: No growth in 36 - 48 hours.  Urine Culture     Status: None   Collection Time: 07/23/17  6:01 PM  Result Value Ref Range Status   Specimen Description URINE, RANDOM  Final   Special Requests NONE  Final   Culture   Final    NO  GROWTH Performed at [facility], [address], [place], [place] [address]    Report Status 07/25/2017 FINAL  Final  Urine Culture     Status: None   Collection Time: 07/24/17  8:47 AM  Result Value Ref Range Status   Urine Culture, Routine Final report  Final   Organism ID, Bacteria No growth  Final  Urine culture     Status: None   Collection Time: 07/24/17  5:41 PM  Result Value Ref Range Status   Specimen Description URINE, CLEAN CATCH  Final   Special Requests NONE  Final   Culture   Final    NO GROWTH Performed at [facility], [place] [address], [place], [place] [address]    Report Status 07/26/2017 FINAL  Final     Labs: Basic Metabolic Panel: Recent Labs  Lab 07/23/17 1558 07/24/17 0847 07/24/17 1753 07/25/17 0500 07/27/17 0537  NA 134* 133*  --  139 137  K 3.6 2.8*  --  3.0* 3.6  CL  --   --   --  113* 109  CO2 20* 21*  --  20* 22  GLUCOSE 167* 147*  --  134* 107*  BUN 10.4 12.0  --  8 9  CREATININE 1.0 1.0  --  0.74 0.79  CALCIUM 8.8 8.8  --  7.4* 8.9  MG  --   --  1.4* 2.2  --    Liver Function Tests: Recent Labs  Lab 07/23/17 1558 07/24/17 0847 07/25/17 0500 07/27/17 0537  AST 57* 43* 29 43*  ALT 106* 92* 55* 55*  ALKPHOS 521* 494* 304* 405*  BILITOT 0.81 0.96 0.4 0.4  PROT 6.8 7.1 5.3* 6.0*  ALBUMIN 2.9* 2.9* 2.3* 2.7*   No results for input(s): LIPASE, AMYLASE in the last 168 hours. No results for input(s): AMMONIA in the last 168 hours. CBC: Recent Labs  Lab 07/23/17 1519 07/24/17 0847 07/25/17 0500 07/27/17 0537  WBC 2.4* 2.2* 3.7* 16.0*  NEUTROABS 1.7 1.8 2.6  --   HGB 8.3* 8.2* 6.3* 8.8*  HCT 26.0* 25.3* 18.7* 26.4*  MCV 76.7* 74.4* 75.7* 77.9*  PLT 95* 96* 65* 103*     SIGNED: Time coordinating discharge: 60 minutes  Faye Ramsay, MD  [facility] 07/27/2017, 11:49 AM Pager [phone]  If 7PM-7AM, please contact night-coverage [email or web address] Password [email or web address]

## 2017-07-27 NOTE — Progress Notes (Signed)
IP PROGRESS NOTE  Subjective:   She was admitted 07/24/2017 with a fever and diarrhea.  She reports feeling better.  She reports one episode of diarrhea yesterday.  She is eating.  She continues to have lower abdominal discomfort after eating.  In general the abdominal pain is much improved.  Objective: Vital signs in last 24 hours: Blood pressure 129/90, pulse 85, temperature 98.1 F (36.7 C), temperature source Oral, resp. rate 20, SpO2 100 %.  Intake/Output from previous day: 12/09 0701 - 12/10 0700 In: 350 [P.O.:350] Out: -   Physical Exam:  HEENT: No thrush Lungs: Clear bilaterally Cardiac: Regular rate and rhythm Abdomen: No hepatomegaly, no mass, soft, nontender Extremities: No leg edema  Portacath/PICC-without erythema  Lab Results: Recent Labs    07/25/17 0500 07/27/17 0537  WBC 3.7* 16.0*  HGB 6.3* 8.8*  HCT 18.7* 26.4*  PLT 65* 103*    BMET Recent Labs    07/25/17 0500 07/27/17 0537  NA 139 137  K 3.0* 3.6  CL 113* 109  CO2 20* 22  GLUCOSE 134* 107*  BUN 8 9  CREATININE 0.74 0.79  CALCIUM 7.4* 8.9    Lab Results  Component Value Date   CEA1 1.3 05/25/2017    Studies/Results: No results found.  Medications: I have reviewed the patient's current medications.  Assessment/Plan: 1. 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 Adenocarcinoma consistent with pancreatobiliary primary.  Cycle 1 FOLFIRINOX 06/18/2017  Cycle 2FOLFIRINOX 07/02/2017  Cycle 3 FOLFIRINOX 07/16/2017 (oxaliplatin dose reduced due to  mild thrombocytopenia)   CT 07/24/2017-new splenomegaly and left hydronephrosis  2. Pain secondary to #1.Improved.  3. Weight losssecondary to #1.  4. Microcytic anemia  Transfused with 1 unit of packed red blood cells 07/25/2017  5. Mild thrombocytopenia, progressive going chemotherapy, improved  6. Port-A-Cath placement 06/04/2017  7. Elevated liver enzymes/bilirubin10/22/2018-likely early biliary obstruction secondary to pancreas cancer, status post ERCP-placement of a common bile duct stent 06/09/2018  8.Postprandial abdominal pain. Protonix initiated 07/16/2017.  9.   Admission with fever 07/24/2017-no source for infection identified, resolved  10.  Diarrhea secondary to chemotherapy  11.  Left hydronephrosis noted on CT of 03/2017-etiology unclear, urology consulted-stent not recommended   Ms. Hafer was admitted for 07/24/2017 with a febrile illness that had progressed over 2 days.  No clear source for infection was identified and cultures of the blood and urine remain negative.  The fever has resolved.  She had diarrhea on hospital admission, likely secondary to chemotherapy-induced enteritis.  I suspect the fever was related to enteritis.  I reviewed the CT from 07/24/2017.  There is a persistent pancreas mass and liver metastases are identified.  Liver lesions were not seen on the October CT.  I will review images and radiology for a direct comparison with the October MRI.  We will check the CA 19-9 today.  She feels well this morning and would like to go home.  She appears stable for discharge.  Outpatient follow-up is scheduled at the Cancer center 07/30/2017.  Recommendations: 1.  Continue Dilaudid as needed for pain 2.  Complete 4 days of outpatient ciprofloxacin (she should have ciprofloxacin at home) 3.  Outpatient follow-up as scheduled 07/30/2017     LOS: 3 days   Betsy Coder, MD   07/27/2017, 9:02 AM

## 2017-07-29 ENCOUNTER — Telehealth: Payer: Self-pay

## 2017-07-29 LAB — CULTURE, BLOOD (SINGLE)

## 2017-07-29 NOTE — Telephone Encounter (Signed)
Pt had 2 watery diarrhea stools this morning. No fever. She is able to eat and drink. No nausea. 1 x diarrhea yesterday, loose more than watery.   The discharge papers from hospital 12/10 stopped imodium, cipro, dex, colace and sorbitol. She was admitted with fever and diarrhea. He is asking what to do.   S/w Dr Benay Spice. OK to restart imodium Called husband and let him know. Went over dosing.

## 2017-07-30 ENCOUNTER — Ambulatory Visit (HOSPITAL_BASED_OUTPATIENT_CLINIC_OR_DEPARTMENT_OTHER): Payer: Medicaid Other | Admitting: Oncology

## 2017-07-30 ENCOUNTER — Ambulatory Visit: Payer: Self-pay | Admitting: Nurse Practitioner

## 2017-07-30 ENCOUNTER — Ambulatory Visit: Payer: Medicaid Other

## 2017-07-30 ENCOUNTER — Encounter: Payer: Self-pay | Admitting: Nutrition

## 2017-07-30 ENCOUNTER — Encounter: Payer: Medicaid Other | Admitting: Nutrition

## 2017-07-30 ENCOUNTER — Other Ambulatory Visit (HOSPITAL_BASED_OUTPATIENT_CLINIC_OR_DEPARTMENT_OTHER): Payer: Medicaid Other

## 2017-07-30 ENCOUNTER — Other Ambulatory Visit: Payer: Self-pay

## 2017-07-30 ENCOUNTER — Ambulatory Visit: Payer: Self-pay

## 2017-07-30 VITALS — BP 122/87 | HR 94 | Temp 98.1°F | Resp 18 | Ht 64.0 in | Wt 118.9 lb

## 2017-07-30 DIAGNOSIS — R197 Diarrhea, unspecified: Secondary | ICD-10-CM

## 2017-07-30 DIAGNOSIS — C257 Malignant neoplasm of other parts of pancreas: Secondary | ICD-10-CM

## 2017-07-30 DIAGNOSIS — C787 Secondary malignant neoplasm of liver and intrahepatic bile duct: Secondary | ICD-10-CM | POA: Diagnosis not present

## 2017-07-30 DIAGNOSIS — C259 Malignant neoplasm of pancreas, unspecified: Secondary | ICD-10-CM

## 2017-07-30 DIAGNOSIS — R509 Fever, unspecified: Secondary | ICD-10-CM | POA: Diagnosis not present

## 2017-07-30 LAB — CBC WITH DIFFERENTIAL/PLATELET
BASO%: 0.2 % (ref 0.0–2.0)
Basophils Absolute: 0 10*3/uL (ref 0.0–0.1)
EOS%: 0.1 % (ref 0.0–7.0)
Eosinophils Absolute: 0 10*3/uL (ref 0.0–0.5)
HCT: 30.8 % — ABNORMAL LOW (ref 34.8–46.6)
HGB: 9.9 g/dL — ABNORMAL LOW (ref 11.6–15.9)
LYMPH%: 13.9 % — AB (ref 14.0–49.7)
MCH: 25.8 pg (ref 25.1–34.0)
MCHC: 32.1 g/dL (ref 31.5–36.0)
MCV: 80.4 fL (ref 79.5–101.0)
MONO#: 0.7 10*3/uL (ref 0.1–0.9)
MONO%: 4.7 % (ref 0.0–14.0)
NEUT%: 81.1 % — ABNORMAL HIGH (ref 38.4–76.8)
NEUTROS ABS: 12 10*3/uL — AB (ref 1.5–6.5)
PLATELETS: 160 10*3/uL (ref 145–400)
RBC: 3.83 10*6/uL (ref 3.70–5.45)
RDW: 21.8 % — ABNORMAL HIGH (ref 11.2–14.5)
WBC: 14.8 10*3/uL — AB (ref 3.9–10.3)
lymph#: 2.1 10*3/uL (ref 0.9–3.3)

## 2017-07-30 LAB — COMPREHENSIVE METABOLIC PANEL
ALBUMIN: 3 g/dL — AB (ref 3.5–5.0)
ALK PHOS: 581 U/L — AB (ref 40–150)
ALT: 70 U/L — ABNORMAL HIGH (ref 0–55)
AST: 58 U/L — ABNORMAL HIGH (ref 5–34)
Anion Gap: 11 mEq/L (ref 3–11)
BILIRUBIN TOTAL: 0.43 mg/dL (ref 0.20–1.20)
BUN: 8.8 mg/dL (ref 7.0–26.0)
CALCIUM: 9.6 mg/dL (ref 8.4–10.4)
CO2: 20 mEq/L — ABNORMAL LOW (ref 22–29)
Chloride: 110 mEq/L — ABNORMAL HIGH (ref 98–109)
Creatinine: 0.8 mg/dL (ref 0.6–1.1)
Glucose: 116 mg/dl (ref 70–140)
POTASSIUM: 3.7 meq/L (ref 3.5–5.1)
Sodium: 140 mEq/L (ref 136–145)
Total Protein: 6.9 g/dL (ref 6.4–8.3)

## 2017-07-30 NOTE — Progress Notes (Signed)
[place] OFFICE PROGRESS NOTE   Diagnosis: Pancreas cancer  INTERVAL HISTORY:   [name] was admitted 07/24/2017 with a fever and failure to thrive.  The fever resolved in the hospital.  No clear source for infection was identified.  She had diarrhea on hospital admission.  This has improved.  She reports 2 episodes of diarrhea yesterday.  She took Imodium.  No diarrhea today.  She has a good appetite.  No abdominal pain.  Objective:  Vital signs in last 24 hours:  Blood pressure 122/87, pulse 94, temperature 98.1 F (36.7 C), temperature source Oral, resp. rate 18, height 5\' 4"  (1.626 m), weight 118 lb 14.4 oz (53.9 kg), SpO2 100 %.    HEENT: No thrush or ulcers Resp: Lungs clear bilaterally Cardio: Regular rate and rhythm GI: No hepatomegaly, no mass, nontender, soft Vascular: No leg edema *   Portacath/PICC-without erythema  Lab Results:  Lab Results  Component Value Date   WBC 14.8 (H) 07/30/2017   HGB 9.9 (L) 07/30/2017   HCT 30.8 (L) 07/30/2017   MCV 80.4 07/30/2017   PLT 160 07/30/2017   NEUTROABS 12.0 (H) 07/30/2017    CMP     Component Value Date/Time   NA 140 07/30/2017 0816   K 3.7 07/30/2017 0816   CL 109 07/27/2017 0537   CO2 20 (L) 07/30/2017 0816   GLUCOSE 116 07/30/2017 0816   BUN 8.8 07/30/2017 0816   CREATININE 0.8 07/30/2017 0816   CALCIUM 9.6 07/30/2017 0816   PROT 6.9 07/30/2017 0816   ALBUMIN 3.0 (L) 07/30/2017 0816   AST 58 (H) 07/30/2017 0816   ALT 70 (H) 07/30/2017 0816   ALKPHOS 581 (H) 07/30/2017 0816   BILITOT 0.43 07/30/2017 0816   GFRNONAA >60 07/27/2017 0537   GFRAA >60 07/27/2017 0537     Medications: I have reviewed the patient's current medications.  Assessment/Plan: 1. Pancreascancer, stage IV  MRI abdomen 05/25/2017-pancreas neck mass with encasement of the hepatic/splenic/superior mesenteric arteries, liver lesions, portal/splenic vein thrombosis with cavernous transformation of the portal  vein  CT chest 05/25/2017-no evidence of metastatic disease to the chest, pancreas neck mass with vascular encasement and portacaval adenopathy, liver metastases not visualized  Elevated CA 19-9  Ultrasound-guided biopsy of a left liver lesion 05/26/2017. Adenocarcinoma consistent with pancreatobiliary primary.  Cycle 1 FOLFIRINOX 06/18/2017  Cycle 2FOLFIRINOX 07/02/2017  Cycle 3 FOLFIRINOX 07/16/2017 (oxaliplatin dose reduced due to mild thrombocytopenia)   CT 07/24/2017-new splenomegaly and left hydronephrosis  2. Pain secondary to #1.Improved.  3. Weight losssecondary to #1.  4. Microcytic anemia  Transfused with 1 unit of packed red blood cells 07/25/2017  5. Mild thrombocytopenia, progressive going chemotherapy, improved  6. Port-A-Cath placement 06/04/2017  7. Elevated liver enzymes/bilirubin10/22/2018-likely early biliary obstruction secondary to pancreas cancer, status post ERCP-placement of a common bile duct stent 06/09/2018  8.Postprandial abdominal pain. Protonix initiated 07/16/2017.  9.  Admission with fever 07/24/2017-no source for infection identified, resolved  10.  Diarrhea following cycle 3 FOLFIRINOX-resolved  11.  Left hydronephrosis noted on CT 07/25/2017 -etiology unclear, urology consulted-stent not recommended   Disposition:    [name] has a much improved performance status compared to when she was admitted last week.  It is unclear whether she had a fever and diarrhea related to chemotherapy-induced enteritis, or an infection.  She has completed 3 cycles of FOLFIRINOX.  Her performance status and abdominal pain have improved significantly.  It is difficult to compare the 07/24/2017 CT for a measure  of response since the liver lesions were seen only on MRI initially.  There was also a significant interval between the baseline MRI and initiation of chemotherapy.  We repeated a CA 19-9 today.  My impression is she has benefited  from FOLFIRINOX.  The plan is to proceed with cycle 4 FOLFIRINOX 08/08/2017.  We will give her an extra week off of chemotherapy prior to cycle 4.  She will return for an office visit in the next cycle of FOLFIRINOX 08/24/2017.  25 minutes were spent with the patient today.  The majority of the time was used for counseling and coordination of care.  Betsy Coder, MD  07/30/2017  5:39 PM

## 2017-07-31 LAB — CANCER ANTIGEN 19-9

## 2017-08-06 ENCOUNTER — Telehealth: Payer: Self-pay | Admitting: Oncology

## 2017-08-06 NOTE — Telephone Encounter (Signed)
Scheduled appt per 12/17 sch message - Patient is aware of appt date and time.

## 2017-08-07 ENCOUNTER — Other Ambulatory Visit: Payer: Self-pay | Admitting: Oncology

## 2017-08-07 ENCOUNTER — Telehealth: Payer: Self-pay | Admitting: Oncology

## 2017-08-07 ENCOUNTER — Other Ambulatory Visit: Payer: Medicaid Other

## 2017-08-07 ENCOUNTER — Other Ambulatory Visit (HOSPITAL_BASED_OUTPATIENT_CLINIC_OR_DEPARTMENT_OTHER): Payer: Medicaid Other

## 2017-08-07 DIAGNOSIS — C787 Secondary malignant neoplasm of liver and intrahepatic bile duct: Principal | ICD-10-CM

## 2017-08-07 DIAGNOSIS — C257 Malignant neoplasm of other parts of pancreas: Secondary | ICD-10-CM | POA: Diagnosis not present

## 2017-08-07 DIAGNOSIS — C259 Malignant neoplasm of pancreas, unspecified: Secondary | ICD-10-CM

## 2017-08-07 LAB — CBC WITH DIFFERENTIAL/PLATELET
BASO%: 0.7 % (ref 0.0–2.0)
Basophils Absolute: 0.1 10*3/uL (ref 0.0–0.1)
EOS ABS: 0 10*3/uL (ref 0.0–0.5)
EOS%: 0.1 % (ref 0.0–7.0)
HCT: 30 % — ABNORMAL LOW (ref 34.8–46.6)
HGB: 9.5 g/dL — ABNORMAL LOW (ref 11.6–15.9)
LYMPH%: 14.9 % (ref 14.0–49.7)
MCH: 26.8 pg (ref 25.1–34.0)
MCHC: 31.7 g/dL (ref 31.5–36.0)
MCV: 84.5 fL (ref 79.5–101.0)
MONO#: 0.7 10*3/uL (ref 0.1–0.9)
MONO%: 8.8 % (ref 0.0–14.0)
NEUT#: 6.2 10*3/uL (ref 1.5–6.5)
NEUT%: 75.5 % (ref 38.4–76.8)
PLATELETS: 138 10*3/uL — AB (ref 145–400)
RBC: 3.55 10*6/uL — AB (ref 3.70–5.45)
RDW: 25 % — ABNORMAL HIGH (ref 11.2–14.5)
WBC: 8.2 10*3/uL (ref 3.9–10.3)
lymph#: 1.2 10*3/uL (ref 0.9–3.3)

## 2017-08-07 LAB — COMPREHENSIVE METABOLIC PANEL
ALT: 59 U/L — ABNORMAL HIGH (ref 0–55)
ANION GAP: 8 meq/L (ref 3–11)
AST: 58 U/L — ABNORMAL HIGH (ref 5–34)
Albumin: 3 g/dL — ABNORMAL LOW (ref 3.5–5.0)
Alkaline Phosphatase: 520 U/L — ABNORMAL HIGH (ref 40–150)
BILIRUBIN TOTAL: 0.42 mg/dL (ref 0.20–1.20)
BUN: 9.6 mg/dL (ref 7.0–26.0)
CHLORIDE: 107 meq/L (ref 98–109)
CO2: 25 meq/L (ref 22–29)
Calcium: 9.1 mg/dL (ref 8.4–10.4)
Creatinine: 0.8 mg/dL (ref 0.6–1.1)
Glucose: 114 mg/dl (ref 70–140)
POTASSIUM: 4.2 meq/L (ref 3.5–5.1)
Sodium: 141 mEq/L (ref 136–145)
Total Protein: 6.7 g/dL (ref 6.4–8.3)

## 2017-08-07 NOTE — Telephone Encounter (Signed)
Called patient regarding 12/22 told to ask for calendar on the 1/7

## 2017-08-08 ENCOUNTER — Ambulatory Visit (HOSPITAL_BASED_OUTPATIENT_CLINIC_OR_DEPARTMENT_OTHER): Payer: Medicaid Other

## 2017-08-08 VITALS — BP 120/87 | HR 90 | Temp 98.6°F | Resp 16

## 2017-08-08 DIAGNOSIS — C259 Malignant neoplasm of pancreas, unspecified: Secondary | ICD-10-CM

## 2017-08-08 DIAGNOSIS — C787 Secondary malignant neoplasm of liver and intrahepatic bile duct: Secondary | ICD-10-CM

## 2017-08-08 DIAGNOSIS — Z5111 Encounter for antineoplastic chemotherapy: Secondary | ICD-10-CM | POA: Diagnosis present

## 2017-08-08 DIAGNOSIS — C257 Malignant neoplasm of other parts of pancreas: Secondary | ICD-10-CM | POA: Diagnosis not present

## 2017-08-08 MED ORDER — PALONOSETRON HCL INJECTION 0.25 MG/5ML
0.2500 mg | Freq: Once | INTRAVENOUS | Status: AC
  Administered 2017-08-08: 0.25 mg via INTRAVENOUS

## 2017-08-08 MED ORDER — ATROPINE SULFATE 1 MG/ML IJ SOLN
INTRAMUSCULAR | Status: AC
  Filled 2017-08-08: qty 1

## 2017-08-08 MED ORDER — DEXTROSE 5 % IV SOLN
400.0000 mg/m2 | Freq: Once | INTRAVENOUS | Status: AC
  Administered 2017-08-08: 624 mg via INTRAVENOUS
  Filled 2017-08-08: qty 31.2

## 2017-08-08 MED ORDER — OXALIPLATIN CHEMO INJECTION 100 MG/20ML
65.0000 mg/m2 | Freq: Once | INTRAVENOUS | Status: AC
  Administered 2017-08-08: 100 mg via INTRAVENOUS
  Filled 2017-08-08: qty 20

## 2017-08-08 MED ORDER — IRINOTECAN HCL CHEMO INJECTION 100 MG/5ML
150.0000 mg/m2 | Freq: Once | INTRAVENOUS | Status: AC
  Administered 2017-08-08: 240 mg via INTRAVENOUS
  Filled 2017-08-08: qty 10

## 2017-08-08 MED ORDER — ATROPINE SULFATE 1 MG/ML IJ SOLN
0.5000 mg | Freq: Once | INTRAMUSCULAR | Status: AC | PRN
  Administered 2017-08-08: 0.5 mg via INTRAVENOUS

## 2017-08-08 MED ORDER — SODIUM CHLORIDE 0.9 % IV SOLN
Freq: Once | INTRAVENOUS | Status: AC
  Administered 2017-08-08: 08:00:00 via INTRAVENOUS
  Filled 2017-08-08: qty 5

## 2017-08-08 MED ORDER — DEXTROSE 5 % IV SOLN
Freq: Once | INTRAVENOUS | Status: AC
  Administered 2017-08-08: 08:00:00 via INTRAVENOUS

## 2017-08-08 MED ORDER — SODIUM CHLORIDE 0.9 % IV SOLN
2400.0000 mg/m2 | INTRAVENOUS | Status: DC
  Administered 2017-08-08: 3750 mg via INTRAVENOUS
  Filled 2017-08-08: qty 75

## 2017-08-08 MED ORDER — PALONOSETRON HCL INJECTION 0.25 MG/5ML
INTRAVENOUS | Status: AC
  Filled 2017-08-08: qty 5

## 2017-08-08 NOTE — Patient Instructions (Signed)
[facility] Discharge Instructions for Patients Receiving Chemotherapy  Today you received the following chemotherapy agents oxaliplatin/leucovorin/florouracil/irinotecan   To help prevent nausea and vomiting after your treatment, we encourage you to take your nausea medication as directed   If you develop nausea and vomiting that is not controlled by your nausea medication, call the clinic.   BELOW ARE SYMPTOMS THAT SHOULD BE REPORTED IMMEDIATELY:  *FEVER GREATER THAN 100.5 F  *CHILLS WITH OR WITHOUT FEVER  NAUSEA AND VOMITING THAT IS NOT CONTROLLED WITH YOUR NAUSEA MEDICATION  *UNUSUAL SHORTNESS OF BREATH  *UNUSUAL BRUISING OR BLEEDING  TENDERNESS IN MOUTH AND THROAT WITH OR WITHOUT PRESENCE OF ULCERS  *URINARY PROBLEMS  *BOWEL PROBLEMS  UNUSUAL RASH Items with * indicate a potential emergency and should be followed up as soon as possible.  Feel free to call the clinic you have any questions or concerns. The clinic phone number is [phone].  

## 2017-08-10 ENCOUNTER — Other Ambulatory Visit: Payer: Self-pay | Admitting: *Deleted

## 2017-08-10 ENCOUNTER — Ambulatory Visit (HOSPITAL_BASED_OUTPATIENT_CLINIC_OR_DEPARTMENT_OTHER): Payer: Medicaid Other

## 2017-08-10 DIAGNOSIS — C259 Malignant neoplasm of pancreas, unspecified: Secondary | ICD-10-CM

## 2017-08-10 DIAGNOSIS — Z5189 Encounter for other specified aftercare: Secondary | ICD-10-CM

## 2017-08-10 DIAGNOSIS — C257 Malignant neoplasm of other parts of pancreas: Secondary | ICD-10-CM | POA: Diagnosis not present

## 2017-08-10 DIAGNOSIS — C787 Secondary malignant neoplasm of liver and intrahepatic bile duct: Principal | ICD-10-CM

## 2017-08-10 MED ORDER — PEGFILGRASTIM INJECTION 6 MG/0.6ML ~~LOC~~
6.0000 mg | PREFILLED_SYRINGE | Freq: Once | SUBCUTANEOUS | Status: AC
  Administered 2017-08-10: 6 mg via SUBCUTANEOUS

## 2017-08-10 MED ORDER — PEGFILGRASTIM INJECTION 6 MG/0.6ML ~~LOC~~
PREFILLED_SYRINGE | SUBCUTANEOUS | Status: AC
  Filled 2017-08-10: qty 0.6

## 2017-08-10 MED ORDER — HEPARIN SOD (PORK) LOCK FLUSH 100 UNIT/ML IV SOLN
500.0000 [IU] | Freq: Once | INTRAVENOUS | Status: AC | PRN
Start: 2017-08-10 — End: 2017-08-10
  Administered 2017-08-10: 500 [IU]
  Filled 2017-08-10: qty 5

## 2017-08-10 MED ORDER — DEXAMETHASONE 4 MG PO TABS: ORAL_TABLET | ORAL | 1 refills | Status: DC

## 2017-08-10 MED ORDER — SODIUM CHLORIDE 0.9% FLUSH
10.0000 mL | INTRAVENOUS | Status: DC | PRN
  Administered 2017-08-10: 10 mL
  Filled 2017-08-10: qty 10

## 2017-08-10 NOTE — Patient Instructions (Signed)
Pegfilgrastim injection What is this medicine? PEGFILGRASTIM (PEG fil gra stim) is a long-acting granulocyte colony-stimulating factor that stimulates the growth of neutrophils, a type of white blood cell important in the body's fight against infection. It is used to reduce the incidence of fever and infection in patients with certain types of cancer who are receiving chemotherapy that affects the bone marrow, and to increase survival after being exposed to high doses of radiation. This medicine may be used for other purposes; ask your health care provider or pharmacist if you have questions. COMMON BRAND NAME(S): Neulasta What should I tell my health care provider before I take this medicine? They need to know if you have any of these conditions: -kidney disease -latex allergy -ongoing radiation therapy -sickle cell disease -skin reactions to acrylic adhesives (On-Body Injector only) -an unusual or allergic reaction to pegfilgrastim, filgrastim, other medicines, foods, dyes, or preservatives -pregnant or trying to get pregnant -breast-feeding How should I use this medicine? This medicine is for injection under the skin. If you get this medicine at home, you will be taught how to prepare and give the pre-filled syringe or how to use the On-body Injector. Refer to the patient Instructions for Use for detailed instructions. Use exactly as directed. Tell your healthcare provider immediately if you suspect that the On-body Injector may not have performed as intended or if you suspect the use of the On-body Injector resulted in a missed or partial dose. It is important that you put your used needles and syringes in a special sharps container. Do not put them in a trash can. If you do not have a sharps container, call your pharmacist or healthcare provider to get one. Talk to your pediatrician regarding the use of this medicine in children. While this drug may be prescribed for selected conditions,  precautions do apply. Overdosage: If you think you have taken too much of this medicine contact a poison control center or emergency room at once. NOTE: This medicine is only for you. Do not share this medicine with others. What if I miss a dose? It is important not to miss your dose. Call your doctor or health care professional if you miss your dose. If you miss a dose due to an On-body Injector failure or leakage, a new dose should be administered as soon as possible using a single prefilled syringe for manual use. What may interact with this medicine? Interactions have not been studied. Give your health care provider a list of all the medicines, herbs, non-prescription drugs, or dietary supplements you use. Also tell them if you smoke, drink alcohol, or use illegal drugs. Some items may interact with your medicine. This list may not describe all possible interactions. Give your health care provider a list of all the medicines, herbs, non-prescription drugs, or dietary supplements you use. Also tell them if you smoke, drink alcohol, or use illegal drugs. Some items may interact with your medicine. What should I watch for while using this medicine? You may need blood work done while you are taking this medicine. If you are going to need a MRI, CT scan, or other procedure, tell your doctor that you are using this medicine (On-Body Injector only). What side effects may I notice from receiving this medicine? Side effects that you should report to your doctor or health care professional as soon as possible: -allergic reactions like skin rash, itching or hives, swelling of the face, lips, or tongue -dizziness -fever -pain, redness, or irritation at site   where injected -pinpoint red spots on the skin -red or dark-brown urine -shortness of breath or breathing problems -stomach or side pain, or pain at the shoulder -swelling -tiredness -trouble passing urine or change in the amount of urine Side  effects that usually do not require medical attention (report to your doctor or health care professional if they continue or are bothersome): -bone pain -muscle pain This list may not describe all possible side effects. Call your doctor for medical advice about side effects. You may report side effects to FDA at 1-800-FDA-1088. Where should I keep my medicine? Keep out of the reach of children. Store pre-filled syringes in a refrigerator between 2 and 8 degrees C (36 and 46 degrees F). Do not freeze. Keep in carton to protect from light. Throw away this medicine if it is left out of the refrigerator for more than 48 hours. Throw away any unused medicine after the expiration date. NOTE: This sheet is a summary. It may not cover all possible information. If you have questions about this medicine, talk to your doctor, pharmacist, or health care provider.  2018 Elsevier/Gold Standard (2016-07-31 12:58:03)  

## 2017-08-10 NOTE — Telephone Encounter (Signed)
Pump disconnected today, pt requested steroid refill to prevent nausea. Reviewed with Dr. Benay Spice. Order received and e-scribed to pharmacy. Pt instructed to go to scheduling to move appt to 12/26.

## 2017-08-12 ENCOUNTER — Telehealth: Payer: Self-pay | Admitting: Nurse Practitioner

## 2017-08-12 ENCOUNTER — Ambulatory Visit (HOSPITAL_BASED_OUTPATIENT_CLINIC_OR_DEPARTMENT_OTHER): Payer: Medicaid Other | Admitting: Nurse Practitioner

## 2017-08-12 ENCOUNTER — Encounter: Payer: Self-pay | Admitting: Nurse Practitioner

## 2017-08-12 VITALS — BP 124/80 | HR 95 | Temp 97.9°F | Resp 18 | Ht 64.0 in | Wt 117.6 lb

## 2017-08-12 DIAGNOSIS — R634 Abnormal weight loss: Secondary | ICD-10-CM | POA: Diagnosis not present

## 2017-08-12 DIAGNOSIS — C259 Malignant neoplasm of pancreas, unspecified: Secondary | ICD-10-CM | POA: Diagnosis not present

## 2017-08-12 DIAGNOSIS — C787 Secondary malignant neoplasm of liver and intrahepatic bile duct: Secondary | ICD-10-CM | POA: Diagnosis not present

## 2017-08-12 DIAGNOSIS — G893 Neoplasm related pain (acute) (chronic): Secondary | ICD-10-CM

## 2017-08-12 MED ORDER — LOPERAMIDE HCL 2 MG PO CAPS
ORAL_CAPSULE | ORAL | Status: AC
  Filled 2017-08-12: qty 2

## 2017-08-12 MED ORDER — LOPERAMIDE HCL 2 MG PO CAPS: 2.0000 mg | ORAL_CAPSULE | ORAL | 0 refills | Status: DC | PRN

## 2017-08-12 NOTE — Telephone Encounter (Signed)
Scheduled appt per 12/26 los - Gave patient aVS and calender per los.

## 2017-08-12 NOTE — Progress Notes (Signed)
  [place] OFFICE PROGRESS NOTE   Diagnosis: Pancreas cancer  INTERVAL HISTORY:   Crystal Carlson returns as scheduled.  She completed cycle 4 FOLFIRINOX 08/08/2017.  She denies nausea/vomiting.  No mouth sores.  She is having diarrhea and crampy lower abdominal pain.  She has not started Imodium.  No numbness or tingling in her hands or feet.  Objective:  Vital signs in last 24 hours:  Blood pressure 124/80, pulse 95, temperature 97.9 F (36.6 C), temperature source Oral, resp. rate 18, height 5\' 4"  (1.626 m), weight 117 lb 9.6 oz (53.3 kg), SpO2 100 %.    HEENT: No thrush or ulcers. Resp: Lungs clear bilaterally. Cardio: Regular rate and rhythm. GI: Abdomen is soft.  Mild tenderness at the low abdomen.  No hepatomegaly. Vascular: No leg edema.  Calves soft and nontender. Port-A-Cath without erythema.  Lab Results:  Lab Results  Component Value Date   WBC 8.2 08/07/2017   HGB 9.5 (L) 08/07/2017   HCT 30.0 (L) 08/07/2017   MCV 84.5 08/07/2017   PLT 138 (L) 08/07/2017   NEUTROABS 6.2 08/07/2017    Imaging:  No results found.  Medications: I have reviewed the patient's current medications.  Assessment/Plan: 1. 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 Adenocarcinoma consistent with pancreatobiliary primary.  Cycle 1 FOLFIRINOX 06/18/2017  Cycle 2FOLFIRINOX 07/02/2017  Cycle 3 FOLFIRINOX 07/16/2017 (oxaliplatin dose reduced due to mild thrombocytopenia)  CT 07/24/2017-new splenomegaly and left hydronephrosis  Cycle  4 FOLFIRINOX 08/08/2017  2. Pain secondary to #1.Improved.  3. Weight losssecondary to #1.  4. Microcytic anemia  Transfused with 1 unit of packed red blood cells 07/25/2017  5. Mild thrombocytopenia, progressive going chemotherapy, improved  6. Port-A-Cath placement 06/04/2017  7. Elevated liver enzymes/bilirubin10/22/2018-likely early biliary obstruction secondary to pancreas cancer, status post ERCP-placement of a common bile duct stent 06/09/2018  8.Postprandial abdominal pain. Protonix initiated 07/16/2017.  9.Admission with fever 07/24/2017-no source for infection identified, resolved  10.Diarrhea following cycle 3 FOLFIRINOX-resolved  11.Left hydronephrosis noted on CT 07/25/2017 -etiology unclear, urology consulted-stent not recommended    Disposition: Crystal Carlson appears stable.  She has completed 4 cycles of FOLFIRINOX.  Clinical status continues to be improved.  The plan is for restaging CTs after she has completed 5 cycles.  She is having diarrhea, likely Irinotecan related.  We gave her 2 Imodium tablets in the office.  She will continue Imodium at home.  She understands to contact the office with poorly controlled diarrhea.  She will return for labs and a follow-up visit on 08/24/2017.  She will contact the office in the interim as outlined above or with any other problems.  Plan reviewed with Dr. Benay Spice.   Crystal Carlson ANP/GNP-BC   08/12/2017  11:41 AM

## 2017-08-13 ENCOUNTER — Ambulatory Visit: Payer: Self-pay | Admitting: Nurse Practitioner

## 2017-08-13 ENCOUNTER — Other Ambulatory Visit: Payer: Self-pay

## 2017-08-13 ENCOUNTER — Ambulatory Visit: Payer: Self-pay

## 2017-08-22 ENCOUNTER — Other Ambulatory Visit: Payer: Self-pay | Admitting: Oncology

## 2017-08-24 ENCOUNTER — Inpatient Hospital Stay: Payer: Medicaid Other | Attending: Nurse Practitioner | Admitting: Nurse Practitioner

## 2017-08-24 ENCOUNTER — Encounter: Payer: Self-pay | Admitting: Nurse Practitioner

## 2017-08-24 ENCOUNTER — Telehealth: Payer: Self-pay | Admitting: Oncology

## 2017-08-24 ENCOUNTER — Inpatient Hospital Stay: Payer: Medicaid Other

## 2017-08-24 VITALS — BP 116/82 | HR 77 | Temp 98.3°F | Resp 18 | Ht 64.0 in | Wt 121.9 lb

## 2017-08-24 DIAGNOSIS — C787 Secondary malignant neoplasm of liver and intrahepatic bile duct: Principal | ICD-10-CM

## 2017-08-24 DIAGNOSIS — R509 Fever, unspecified: Secondary | ICD-10-CM | POA: Diagnosis not present

## 2017-08-24 DIAGNOSIS — C259 Malignant neoplasm of pancreas, unspecified: Secondary | ICD-10-CM

## 2017-08-24 DIAGNOSIS — R7881 Bacteremia: Secondary | ICD-10-CM | POA: Diagnosis not present

## 2017-08-24 DIAGNOSIS — R197 Diarrhea, unspecified: Secondary | ICD-10-CM | POA: Diagnosis not present

## 2017-08-24 DIAGNOSIS — D6959 Other secondary thrombocytopenia: Secondary | ICD-10-CM | POA: Insufficient documentation

## 2017-08-24 DIAGNOSIS — B961 Klebsiella pneumoniae [K. pneumoniae] as the cause of diseases classified elsewhere: Secondary | ICD-10-CM | POA: Insufficient documentation

## 2017-08-24 DIAGNOSIS — Z5111 Encounter for antineoplastic chemotherapy: Secondary | ICD-10-CM | POA: Diagnosis not present

## 2017-08-24 DIAGNOSIS — R52 Pain, unspecified: Secondary | ICD-10-CM

## 2017-08-24 DIAGNOSIS — C257 Malignant neoplasm of other parts of pancreas: Secondary | ICD-10-CM | POA: Insufficient documentation

## 2017-08-24 DIAGNOSIS — G893 Neoplasm related pain (acute) (chronic): Secondary | ICD-10-CM | POA: Insufficient documentation

## 2017-08-24 DIAGNOSIS — A419 Sepsis, unspecified organism: Secondary | ICD-10-CM | POA: Insufficient documentation

## 2017-08-24 DIAGNOSIS — Z5189 Encounter for other specified aftercare: Secondary | ICD-10-CM | POA: Insufficient documentation

## 2017-08-24 DIAGNOSIS — Z95828 Presence of other vascular implants and grafts: Secondary | ICD-10-CM

## 2017-08-24 LAB — COMPREHENSIVE METABOLIC PANEL
ALBUMIN: 2.7 g/dL — AB (ref 3.5–5.0)
ALT: 51 U/L (ref 0–55)
AST: 42 U/L — ABNORMAL HIGH (ref 5–34)
Alkaline Phosphatase: 482 U/L — ABNORMAL HIGH (ref 40–150)
Anion gap: 8 (ref 3–11)
BUN: 7 mg/dL (ref 7–26)
CO2: 23 mmol/L (ref 22–29)
Calcium: 9.2 mg/dL (ref 8.4–10.4)
Chloride: 109 mmol/L (ref 98–109)
Creatinine, Ser: 0.75 mg/dL (ref 0.60–1.10)
GFR calc non Af Amer: >60 mL/min (ref >60)
Glucose, Bld: 119 mg/dL (ref 70–140)
Potassium: 3.9 mmol/L (ref 3.3–4.7)
SODIUM: 140 mmol/L (ref 136–145)
Total Bilirubin: 0.5 mg/dL (ref 0.2–1.2)
Total Protein: 6.3 g/dL — ABNORMAL LOW (ref 6.4–8.3)

## 2017-08-24 LAB — CBC WITH DIFFERENTIAL/PLATELET
ABS GRANULOCYTE: 6.9 10*3/uL — AB (ref 1.5–6.5)
BASOS ABS: 0 10*3/uL (ref 0.0–0.1)
Basophils Relative: 0 %
Eosinophils Absolute: 0 10*3/uL (ref 0.0–0.5)
Eosinophils Relative: 0 %
HEMATOCRIT: 26.7 % — AB (ref 34.8–46.6)
HEMOGLOBIN: 8.3 g/dL — AB (ref 11.6–15.9)
LYMPHS ABS: 1 10*3/uL (ref 0.9–3.3)
LYMPHS PCT: 12 %
MCH: 26.5 pg (ref 25.1–34.0)
MCHC: 31.1 g/dL — ABNORMAL LOW (ref 31.5–36.0)
MCV: 85.3 fL (ref 79.5–101.0)
Monocytes Absolute: 0.4 10*3/uL (ref 0.1–0.9)
Monocytes Relative: 5 %
NEUTROS ABS: 6.9 10*3/uL — AB (ref 1.5–6.5)
NEUTROS PCT: 83 %
Platelets: 121 10*3/uL — ABNORMAL LOW (ref 145–400)
RBC: 3.13 MIL/uL — ABNORMAL LOW (ref 3.70–5.45)
RDW: 24.3 % — ABNORMAL HIGH (ref 11.2–16.1)
WBC: 8.4 10*3/uL (ref 3.9–10.3)

## 2017-08-24 LAB — MAGNESIUM: MAGNESIUM: 2.2 mg/dL (ref 1.5–2.5)

## 2017-08-24 MED ORDER — METOCLOPRAMIDE HCL 5 MG PO TABS: 5.0000 mg | ORAL_TABLET | Freq: Three times a day (TID) | ORAL | 0 refills | Status: DC

## 2017-08-24 MED ORDER — SODIUM CHLORIDE 0.9 % IV SOLN
2400.0000 mg/m2 | INTRAVENOUS | Status: DC
  Administered 2017-08-24: 3750 mg via INTRAVENOUS
  Filled 2017-08-24: qty 75

## 2017-08-24 MED ORDER — IRINOTECAN HCL CHEMO INJECTION 100 MG/5ML
150.0000 mg/m2 | Freq: Once | INTRAVENOUS | Status: AC
  Administered 2017-08-24: 240 mg via INTRAVENOUS
  Filled 2017-08-24: qty 12

## 2017-08-24 MED ORDER — PALONOSETRON HCL INJECTION 0.25 MG/5ML
INTRAVENOUS | Status: AC
  Filled 2017-08-24: qty 5

## 2017-08-24 MED ORDER — PALONOSETRON HCL INJECTION 0.25 MG/5ML
0.2500 mg | Freq: Once | INTRAVENOUS | Status: AC
  Administered 2017-08-24: 0.25 mg via INTRAVENOUS

## 2017-08-24 MED ORDER — SODIUM CHLORIDE 0.9% FLUSH
10.0000 mL | Freq: Once | INTRAVENOUS | Status: AC
  Administered 2017-08-24: 10 mL
  Filled 2017-08-24: qty 10

## 2017-08-24 MED ORDER — SODIUM CHLORIDE 0.9 % IV SOLN
Freq: Once | INTRAVENOUS | Status: AC
  Administered 2017-08-24: 12:00:00 via INTRAVENOUS
  Filled 2017-08-24: qty 5

## 2017-08-24 MED ORDER — DEXTROSE 5 % IV SOLN
Freq: Once | INTRAVENOUS | Status: AC
  Administered 2017-08-24: 11:00:00 via INTRAVENOUS

## 2017-08-24 MED ORDER — LEUCOVORIN CALCIUM INJECTION 350 MG
400.0000 mg/m2 | Freq: Once | INTRAVENOUS | Status: AC
  Administered 2017-08-24: 624 mg via INTRAVENOUS
  Filled 2017-08-24: qty 31.2

## 2017-08-24 MED ORDER — ATROPINE SULFATE 1 MG/ML IJ SOLN
INTRAMUSCULAR | Status: AC
  Filled 2017-08-24: qty 1

## 2017-08-24 MED ORDER — ATROPINE SULFATE 1 MG/ML IJ SOLN
0.5000 mg | Freq: Once | INTRAMUSCULAR | Status: AC | PRN
  Administered 2017-08-24: 0.5 mg via INTRAVENOUS

## 2017-08-24 MED ORDER — OXALIPLATIN CHEMO INJECTION 100 MG/20ML
65.0000 mg/m2 | Freq: Once | INTRAVENOUS | Status: AC
  Administered 2017-08-24: 100 mg via INTRAVENOUS
  Filled 2017-08-24: qty 20

## 2017-08-24 NOTE — Progress Notes (Addendum)
  [place] OFFICE PROGRESS NOTE   Diagnosis: Pancreas cancer  INTERVAL HISTORY:   Crystal Carlson returns as scheduled.  She completed cycle 4 FOLFIRINOX 08/08/2017.  She denies nausea/vomiting.  No mouth sores.  No neuropathy symptoms.  The diarrhea has resolved.  She continues to have bloating and abdominal pain after eating.  She takes Dilaudid mainly at bedtime.  Objective:  Vital signs in last 24 hours:  Blood pressure 116/82, pulse 77, temperature 98.3 F (36.8 C), temperature source Oral, resp. rate 18, height 5\' 4"  (1.626 m), weight 121 lb 14.4 oz (55.3 kg), SpO2 100 %.    HEENT: No thrush or ulcers. Resp: Lungs clear bilaterally. Cardio: Regular rate and rhythm. GI: Abdomen soft and nontender.  No hepatomegaly.  No apparent ascites. Vascular: No leg edema.  Calves soft and nontender. Neuro: Vibratory sense intact over the fingertips bilaterally per tuning fork exam. Skin: Palms without erythema. Port-A-Cath without erythema.   Lab Results:  Lab Results  Component Value Date   WBC 8.2 08/07/2017   HGB 9.5 (L) 08/07/2017   HCT 30.0 (L) 08/07/2017   MCV 84.5 08/07/2017   PLT 138 (L) 08/07/2017   NEUTROABS 6.2 08/07/2017    Imaging:  No results found.  Medications: I have reviewed the patient's current medications.  Assessment/Plan: 1. 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 Adenocarcinoma consistent with pancreatobiliary primary.  Cycle 1 FOLFIRINOX 06/18/2017  Cycle 2FOLFIRINOX 07/02/2017  Cycle 3  FOLFIRINOX 07/16/2017 (oxaliplatin dose reduced due to mild thrombocytopenia)  CT 07/24/2017-new splenomegaly and left hydronephrosis  Cycle 4 FOLFIRINOX 08/08/2017  Cycle 5 FOLFIRINOX 08/24/2017  2. Pain secondary to #1.Improved.  3. Weight losssecondary to #1.  4. Microcytic anemia  Transfused with 1 unit of packed red blood cells 07/25/2017  5. Mild thrombocytopenia, progressive going chemotherapy, improved  6. Port-A-Cath placement 06/04/2017  7. Elevated liver enzymes/bilirubin10/22/2018-likely early biliary obstruction secondary to pancreas cancer, status post ERCP-placement of a common bile duct stent 06/09/2018  8.Postprandial abdominal pain. Protonix initiated 07/16/2017.  Trial of Reglan initiated 08/24/2017.  9.Admission with fever 07/24/2017-no source for infection identified, resolved  10.Diarrheafollowing cycle 3 FOLFIRINOX-resolved  11.Left hydronephrosis noted on CT12/03/2017-etiology unclear, urology consulted-stent not recommended    Disposition: Crystal Carlson appears stable.  She has completed 4 cycles of FOLFIRINOX.  Clinical status continues to be considerably improved.  Plan to proceed with cycle 5 today as scheduled.  Restaging CTs planned 09/04/2017.  She will return for a follow-up visit 09/07/2017.  The etiology of the postprandial abdominal discomfort is unclear.  She will begin a trial of Reglan 5 mg 3 times daily.  We discussed the possibility of tardive dyskinesia.  She understands to discontinue Compazine.  Patient seen with Dr. Benay Spice.  [name] ANP/GNP-BC   08/24/2017  10:32 AM This was a shared visit with [name].  Crystal Carlson has a much improved performance status compared to pre-chemotherapy.  The etiology of the postprandial pain is unclear.  She will begin a trial of Reglan.  She will undergo a restaging CT evaluation after this cycle of chemotherapy.  [name], MD

## 2017-08-24 NOTE — Progress Notes (Signed)
  Oncology Nurse Navigator Documentation  Navigator Location: [facility] (08/24/17 1805)   )Navigator Encounter Type: Treatment (08/24/17 1805)                       Treatment Phase: Active Tx (08/24/17 1805)     Interventions: Psycho-social support (08/24/17 1805)  Met with patient during her infusion tx to offer encouragement and support.          Acuity: Level 1 (08/24/17 1805)         Time Spent with Patient: 15 (08/24/17 1805)

## 2017-08-24 NOTE — Patient Instructions (Signed)
[facility] Discharge Instructions for Patients Receiving Chemotherapy  Today you received the following chemotherapy agents :  Irinotecan, Leucovorin, Fluorouracil.  To help prevent nausea and vomiting after your treatment, we encourage you to take your nausea medication as prescribed.   If you develop nausea and vomiting that is not controlled by your nausea medication, call the clinic.   BELOW ARE SYMPTOMS THAT SHOULD BE REPORTED IMMEDIATELY:  *FEVER GREATER THAN 100.5 F  *CHILLS WITH OR WITHOUT FEVER  NAUSEA AND VOMITING THAT IS NOT CONTROLLED WITH YOUR NAUSEA MEDICATION  *UNUSUAL SHORTNESS OF BREATH  *UNUSUAL BRUISING OR BLEEDING  TENDERNESS IN MOUTH AND THROAT WITH OR WITHOUT PRESENCE OF ULCERS  *URINARY PROBLEMS  *BOWEL PROBLEMS  UNUSUAL RASH Items with * indicate a potential emergency and should be followed up as soon as possible.  Feel free to call the clinic should you have any questions or concerns. The clinic phone number is [phone].  Please show the CHEMO ALERT CARD at check-in to the Emergency Department and triage nurse.   

## 2017-08-24 NOTE — Telephone Encounter (Signed)
Gave avs and calendar for february °

## 2017-08-25 LAB — CANCER ANTIGEN 19-9: CA 19-9: 2042 U/mL — ABNORMAL HIGH (ref 0–35)

## 2017-08-26 ENCOUNTER — Inpatient Hospital Stay: Payer: Medicaid Other

## 2017-08-26 ENCOUNTER — Other Ambulatory Visit: Payer: Self-pay | Admitting: *Deleted

## 2017-08-26 VITALS — BP 114/83 | HR 70 | Temp 98.2°F | Resp 20

## 2017-08-26 DIAGNOSIS — C259 Malignant neoplasm of pancreas, unspecified: Secondary | ICD-10-CM

## 2017-08-26 DIAGNOSIS — C787 Secondary malignant neoplasm of liver and intrahepatic bile duct: Principal | ICD-10-CM

## 2017-08-26 DIAGNOSIS — Z5111 Encounter for antineoplastic chemotherapy: Secondary | ICD-10-CM | POA: Diagnosis not present

## 2017-08-26 MED ORDER — DEXAMETHASONE 4 MG PO TABS: ORAL_TABLET | ORAL | 1 refills | Status: DC

## 2017-08-26 MED ORDER — PEGFILGRASTIM INJECTION 6 MG/0.6ML ~~LOC~~
6.0000 mg | PREFILLED_SYRINGE | Freq: Once | SUBCUTANEOUS | Status: AC
  Administered 2017-08-26: 6 mg via SUBCUTANEOUS

## 2017-08-26 MED ORDER — HEPARIN SOD (PORK) LOCK FLUSH 100 UNIT/ML IV SOLN
500.0000 [IU] | Freq: Once | INTRAVENOUS | Status: AC | PRN
  Administered 2017-08-26: 500 [IU]
  Filled 2017-08-26: qty 5

## 2017-08-26 MED ORDER — SODIUM CHLORIDE 0.9% FLUSH
10.0000 mL | INTRAVENOUS | Status: DC | PRN
  Administered 2017-08-26: 10 mL
  Filled 2017-08-26: qty 10

## 2017-08-26 MED FILL — DEXAMETHASONE 4 MG TABLET: 4 | 3 days supply | Qty: 6 | Fill #0

## 2017-08-26 NOTE — Patient Instructions (Signed)
Pegfilgrastim injection What is this medicine? PEGFILGRASTIM (PEG fil gra stim) is a long-acting granulocyte colony-stimulating factor that stimulates the growth of neutrophils, a type of white blood cell important in the body's fight against infection. It is used to reduce the incidence of fever and infection in patients with certain types of cancer who are receiving chemotherapy that affects the bone marrow, and to increase survival after being exposed to high doses of radiation. This medicine may be used for other purposes; ask your health care provider or pharmacist if you have questions. COMMON BRAND NAME(S): Neulasta What should I tell my health care provider before I take this medicine? They need to know if you have any of these conditions: -kidney disease -latex allergy -ongoing radiation therapy -sickle cell disease -skin reactions to acrylic adhesives (On-Body Injector only) -an unusual or allergic reaction to pegfilgrastim, filgrastim, other medicines, foods, dyes, or preservatives -pregnant or trying to get pregnant -breast-feeding How should I use this medicine? This medicine is for injection under the skin. If you get this medicine at home, you will be taught how to prepare and give the pre-filled syringe or how to use the On-body Injector. Refer to the patient Instructions for Use for detailed instructions. Use exactly as directed. Tell your healthcare provider immediately if you suspect that the On-body Injector may not have performed as intended or if you suspect the use of the On-body Injector resulted in a missed or partial dose. It is important that you put your used needles and syringes in a special sharps container. Do not put them in a trash can. If you do not have a sharps container, call your pharmacist or healthcare provider to get one. Talk to your pediatrician regarding the use of this medicine in children. While this drug may be prescribed for selected conditions,  precautions do apply. Overdosage: If you think you have taken too much of this medicine contact a poison control center or emergency room at once. NOTE: This medicine is only for you. Do not share this medicine with others. What if I miss a dose? It is important not to miss your dose. Call your doctor or health care professional if you miss your dose. If you miss a dose due to an On-body Injector failure or leakage, a new dose should be administered as soon as possible using a single prefilled syringe for manual use. What may interact with this medicine? Interactions have not been studied. Give your health care provider a list of all the medicines, herbs, non-prescription drugs, or dietary supplements you use. Also tell them if you smoke, drink alcohol, or use illegal drugs. Some items may interact with your medicine. This list may not describe all possible interactions. Give your health care provider a list of all the medicines, herbs, non-prescription drugs, or dietary supplements you use. Also tell them if you smoke, drink alcohol, or use illegal drugs. Some items may interact with your medicine. What should I watch for while using this medicine? You may need blood work done while you are taking this medicine. If you are going to need a MRI, CT scan, or other procedure, tell your doctor that you are using this medicine (On-Body Injector only). What side effects may I notice from receiving this medicine? Side effects that you should report to your doctor or health care professional as soon as possible: -allergic reactions like skin rash, itching or hives, swelling of the face, lips, or tongue -dizziness -fever -pain, redness, or irritation at site   where injected -pinpoint red spots on the skin -red or dark-brown urine -shortness of breath or breathing problems -stomach or side pain, or pain at the shoulder -swelling -tiredness -trouble passing urine or change in the amount of urine Side  effects that usually do not require medical attention (report to your doctor or health care professional if they continue or are bothersome): -bone pain -muscle pain This list may not describe all possible side effects. Call your doctor for medical advice about side effects. You may report side effects to FDA at 1-800-FDA-1088. Where should I keep my medicine? Keep out of the reach of children. Store pre-filled syringes in a refrigerator between 2 and 8 degrees C (36 and 46 degrees F). Do not freeze. Keep in carton to protect from light. Throw away this medicine if it is left out of the refrigerator for more than 48 hours. Throw away any unused medicine after the expiration date. NOTE: This sheet is a summary. It may not cover all possible information. If you have questions about this medicine, talk to your doctor, pharmacist, or health care provider.  2018 Elsevier/Gold Standard (2016-07-31 12:58:03)  

## 2017-08-27 ENCOUNTER — Ambulatory Visit: Payer: Self-pay

## 2017-08-27 ENCOUNTER — Encounter: Payer: Medicaid Other | Admitting: Nutrition

## 2017-08-27 ENCOUNTER — Ambulatory Visit: Payer: Self-pay | Admitting: Oncology

## 2017-08-27 ENCOUNTER — Other Ambulatory Visit: Payer: Self-pay

## 2017-08-31 ENCOUNTER — Other Ambulatory Visit: Payer: Self-pay

## 2017-08-31 ENCOUNTER — Telehealth: Payer: Self-pay | Admitting: *Deleted

## 2017-08-31 ENCOUNTER — Inpatient Hospital Stay: Payer: Medicaid Other

## 2017-08-31 ENCOUNTER — Encounter (HOSPITAL_COMMUNITY): Payer: Self-pay | Admitting: *Deleted

## 2017-08-31 ENCOUNTER — Ambulatory Visit (HOSPITAL_COMMUNITY)
Admission: RE | Admit: 2017-08-31 | Discharge: 2017-08-31 | Disposition: A | Discharge status: Home / Self Care | Payer: Medicaid Other | Source: Ambulatory Visit | Attending: Medical | Admitting: Medical

## 2017-08-31 ENCOUNTER — Inpatient Hospital Stay (HOSPITAL_BASED_OUTPATIENT_CLINIC_OR_DEPARTMENT_OTHER): Payer: Medicaid Other | Admitting: Medical

## 2017-08-31 ENCOUNTER — Other Ambulatory Visit: Payer: Self-pay | Admitting: *Deleted

## 2017-08-31 ENCOUNTER — Inpatient Hospital Stay (HOSPITAL_COMMUNITY)
Admission: AD | Admit: 2017-08-31 | Discharge: 2017-09-04 | DRG: 871 | Disposition: A | Discharge status: Home / Self Care | Payer: Medicaid Other | Source: Ambulatory Visit | Attending: Family Medicine | Admitting: Family Medicine

## 2017-08-31 VITALS — BP 137/96 | HR 107 | Temp 103.1°F | Resp 19 | Ht 64.0 in | Wt 114.9 lb

## 2017-08-31 DIAGNOSIS — C259 Malignant neoplasm of pancreas, unspecified: Secondary | ICD-10-CM

## 2017-08-31 DIAGNOSIS — R945 Abnormal results of liver function studies: Secondary | ICD-10-CM

## 2017-08-31 DIAGNOSIS — C787 Secondary malignant neoplasm of liver and intrahepatic bile duct: Secondary | ICD-10-CM

## 2017-08-31 DIAGNOSIS — Z8249 Family history of ischemic heart disease and other diseases of the circulatory system: Secondary | ICD-10-CM

## 2017-08-31 DIAGNOSIS — R7881 Bacteremia: Secondary | ICD-10-CM | POA: Diagnosis not present

## 2017-08-31 DIAGNOSIS — Z5189 Encounter for other specified aftercare: Secondary | ICD-10-CM | POA: Diagnosis not present

## 2017-08-31 DIAGNOSIS — R5081 Fever presenting with conditions classified elsewhere: Secondary | ICD-10-CM

## 2017-08-31 DIAGNOSIS — N1330 Unspecified hydronephrosis: Secondary | ICD-10-CM | POA: Diagnosis present

## 2017-08-31 DIAGNOSIS — E876 Hypokalemia: Secondary | ICD-10-CM | POA: Diagnosis present

## 2017-08-31 DIAGNOSIS — E860 Dehydration: Secondary | ICD-10-CM | POA: Diagnosis present

## 2017-08-31 DIAGNOSIS — T451X5A Adverse effect of antineoplastic and immunosuppressive drugs, initial encounter: Secondary | ICD-10-CM | POA: Diagnosis present

## 2017-08-31 DIAGNOSIS — G893 Neoplasm related pain (acute) (chronic): Secondary | ICD-10-CM | POA: Diagnosis not present

## 2017-08-31 DIAGNOSIS — A4150 Gram-negative sepsis, unspecified: Principal | ICD-10-CM | POA: Diagnosis present

## 2017-08-31 DIAGNOSIS — D509 Iron deficiency anemia, unspecified: Secondary | ICD-10-CM | POA: Diagnosis present

## 2017-08-31 DIAGNOSIS — Z8261 Family history of arthritis: Secondary | ICD-10-CM

## 2017-08-31 DIAGNOSIS — R52 Pain, unspecified: Secondary | ICD-10-CM | POA: Diagnosis not present

## 2017-08-31 DIAGNOSIS — R197 Diarrhea, unspecified: Secondary | ICD-10-CM | POA: Diagnosis present

## 2017-08-31 DIAGNOSIS — Z833 Family history of diabetes mellitus: Secondary | ICD-10-CM

## 2017-08-31 DIAGNOSIS — R509 Fever, unspecified: Secondary | ICD-10-CM | POA: Diagnosis not present

## 2017-08-31 DIAGNOSIS — R6510 Systemic inflammatory response syndrome (SIRS) of non-infectious origin without acute organ dysfunction: Secondary | ICD-10-CM | POA: Diagnosis present

## 2017-08-31 DIAGNOSIS — D6959 Other secondary thrombocytopenia: Secondary | ICD-10-CM | POA: Diagnosis present

## 2017-08-31 DIAGNOSIS — D649 Anemia, unspecified: Secondary | ICD-10-CM | POA: Diagnosis not present

## 2017-08-31 DIAGNOSIS — Z5111 Encounter for antineoplastic chemotherapy: Secondary | ICD-10-CM | POA: Diagnosis present

## 2017-08-31 DIAGNOSIS — D61810 Antineoplastic chemotherapy induced pancytopenia: Secondary | ICD-10-CM | POA: Diagnosis present

## 2017-08-31 DIAGNOSIS — R7989 Other specified abnormal findings of blood chemistry: Secondary | ICD-10-CM | POA: Diagnosis present

## 2017-08-31 DIAGNOSIS — R161 Splenomegaly, not elsewhere classified: Secondary | ICD-10-CM | POA: Diagnosis present

## 2017-08-31 DIAGNOSIS — C257 Malignant neoplasm of other parts of pancreas: Secondary | ICD-10-CM | POA: Diagnosis not present

## 2017-08-31 DIAGNOSIS — B961 Klebsiella pneumoniae [K. pneumoniae] as the cause of diseases classified elsewhere: Secondary | ICD-10-CM | POA: Diagnosis not present

## 2017-08-31 DIAGNOSIS — D6481 Anemia due to antineoplastic chemotherapy: Secondary | ICD-10-CM | POA: Diagnosis not present

## 2017-08-31 DIAGNOSIS — A419 Sepsis, unspecified organism: Secondary | ICD-10-CM | POA: Diagnosis not present

## 2017-08-31 LAB — CMP (CANCER CENTER ONLY)
ALT: 102 U/L — AB (ref 0–55)
AST: 42 U/L — ABNORMAL HIGH (ref 5–34)
Albumin: 2.9 g/dL — ABNORMAL LOW (ref 3.5–5.0)
Alkaline Phosphatase: 463 U/L — ABNORMAL HIGH (ref 40–150)
Anion gap: 11 (ref 3–11)
BUN: 9 mg/dL (ref 7–26)
CHLORIDE: 104 mmol/L (ref 98–109)
CO2: 23 mmol/L (ref 22–29)
Calcium: 8.8 mg/dL (ref 8.4–10.4)
Creatinine: 0.78 mg/dL (ref 0.60–1.10)
Glucose, Bld: 118 mg/dL (ref 70–140)
POTASSIUM: 3.1 mmol/L — AB (ref 3.3–4.7)
SODIUM: 138 mmol/L (ref 136–145)
Total Bilirubin: 1.1 mg/dL (ref 0.2–1.2)
Total Protein: 7.1 g/dL (ref 6.4–8.3)

## 2017-08-31 LAB — CBC WITH DIFFERENTIAL (CANCER CENTER ONLY)
Basophils Absolute: 0 10*3/uL (ref 0.0–0.1)
Basophils Relative: 0 %
EOS ABS: 0 10*3/uL (ref 0.0–0.5)
Eosinophils Relative: 0 %
HEMATOCRIT: 27.5 % — AB (ref 34.8–46.6)
HEMOGLOBIN: 8.8 g/dL — AB (ref 11.6–15.9)
LYMPHS ABS: 0.3 10*3/uL — AB (ref 0.9–3.3)
Lymphocytes Relative: 7 %
MCH: 27.1 pg (ref 25.1–34.0)
MCHC: 32 g/dL (ref 31.5–36.0)
MCV: 84.5 fL (ref 79.5–101.0)
MONOS PCT: 9 %
Monocytes Absolute: 0.4 10*3/uL (ref 0.1–0.9)
NEUTROS PCT: 84 %
Neutro Abs: 3.8 10*3/uL (ref 1.5–6.5)
Platelet Count: 93 10*3/uL — ABNORMAL LOW (ref 145–400)
RBC: 3.26 MIL/uL — AB (ref 3.70–5.45)
RDW: 24.2 % — ABNORMAL HIGH (ref 11.2–16.1)
WBC: 4.5 10*3/uL (ref 3.9–10.3)

## 2017-08-31 LAB — LACTIC ACID, PLASMA
LACTIC ACID, VENOUS: 1.2 mmol/L (ref 0.5–1.9)
Lactic Acid, Venous: 0.9 mmol/L (ref 0.5–1.9)

## 2017-08-31 LAB — PROCALCITONIN: Procalcitonin: 0.64 ng/mL

## 2017-08-31 MED ORDER — ACETAMINOPHEN 650 MG RE SUPP: 650.0000 mg | Freq: Four times a day (QID) | RECTAL | Status: DC | PRN

## 2017-08-31 MED ORDER — VANCOMYCIN HCL IN DEXTROSE 1-5 GM/200ML-% IV SOLN: 1000.0000 mg | INTRAVENOUS | Status: DC

## 2017-08-31 MED ORDER — ALBUTEROL SULFATE (2.5 MG/3ML) 0.083% IN NEBU: 2.5000 mg | INHALATION_SOLUTION | Freq: Four times a day (QID) | RESPIRATORY_TRACT | Status: DC | PRN

## 2017-08-31 MED ORDER — POTASSIUM CHLORIDE CRYS ER 20 MEQ PO TBCR
40.0000 meq | EXTENDED_RELEASE_TABLET | ORAL | Status: AC
  Administered 2017-08-31: 40 meq via ORAL
  Filled 2017-08-31: qty 2

## 2017-08-31 MED ORDER — ONDANSETRON HCL 4 MG PO TABS: 4.0000 mg | ORAL_TABLET | Freq: Four times a day (QID) | ORAL | Status: DC | PRN

## 2017-08-31 MED ORDER — OSELTAMIVIR PHOSPHATE 75 MG PO CAPS
75.0000 mg | ORAL_CAPSULE | Freq: Two times a day (BID) | ORAL | Status: DC
  Administered 2017-08-31 – 2017-09-01 (×2): 75 mg via ORAL
  Filled 2017-08-31 (×2): qty 1

## 2017-08-31 MED ORDER — DEXTROSE 5 % IV SOLN
2.0000 g | Freq: Two times a day (BID) | INTRAVENOUS | Status: DC
  Administered 2017-08-31: 2 g via INTRAVENOUS
  Filled 2017-08-31: qty 2

## 2017-08-31 MED ORDER — VANCOMYCIN HCL IN DEXTROSE 1-5 GM/200ML-% IV SOLN
1000.0000 mg | Freq: Once | INTRAVENOUS | Status: AC
  Administered 2017-08-31: 1000 mg via INTRAVENOUS
  Filled 2017-08-31: qty 200

## 2017-08-31 MED ORDER — ACETAMINOPHEN 325 MG PO TABS
650.0000 mg | ORAL_TABLET | Freq: Once | ORAL | Status: AC
Start: 2017-08-31 — End: 2017-08-31
  Administered 2017-08-31: 650 mg via ORAL

## 2017-08-31 MED ORDER — SODIUM CHLORIDE 0.9% FLUSH
10.0000 mL | INTRAVENOUS | Status: DC | PRN
  Administered 2017-09-02: 20 mL
  Administered 2017-09-03: 10 mL
  Filled 2017-08-31 (×2): qty 40

## 2017-08-31 MED ORDER — ONDANSETRON HCL 4 MG/2ML IJ SOLN: 4.0000 mg | Freq: Four times a day (QID) | INTRAMUSCULAR | Status: DC | PRN

## 2017-08-31 MED ORDER — SODIUM CHLORIDE 0.9 % IV BOLUS (SEPSIS)
500.0000 mL | Freq: Once | INTRAVENOUS | Status: AC
  Administered 2017-08-31: 500 mL via INTRAVENOUS

## 2017-08-31 MED ORDER — ACETAMINOPHEN 325 MG PO TABS
650.0000 mg | ORAL_TABLET | Freq: Four times a day (QID) | ORAL | Status: DC | PRN
  Administered 2017-08-31 – 2017-09-01 (×4): 650 mg via ORAL
  Filled 2017-08-31 (×4): qty 2

## 2017-08-31 MED ORDER — ACETAMINOPHEN 325 MG PO TABS
ORAL_TABLET | ORAL | Status: AC
  Filled 2017-08-31: qty 2

## 2017-08-31 MED ORDER — SODIUM CHLORIDE 0.9 % IV BOLUS (SEPSIS)
1000.0000 mL | Freq: Once | INTRAVENOUS | Status: AC
  Administered 2017-08-31: 1000 mL via INTRAVENOUS

## 2017-08-31 MED ORDER — SODIUM CHLORIDE 0.9 % IV SOLN
Freq: Once | INTRAVENOUS | Status: AC
  Administered 2017-08-31: 16:00:00 via INTRAVENOUS

## 2017-08-31 MED ORDER — SODIUM CHLORIDE 0.9 % IV SOLN
INTRAVENOUS | Status: DC
  Administered 2017-08-31 – 2017-09-03 (×4): via INTRAVENOUS

## 2017-08-31 MED ORDER — DEXTROSE 5 % IV SOLN
1.0000 g | Freq: Three times a day (TID) | INTRAVENOUS | Status: DC
  Administered 2017-09-01 (×2): 1 g via INTRAVENOUS
  Filled 2017-08-31 (×2): qty 1

## 2017-08-31 NOTE — Progress Notes (Signed)
Pt being admitted for fever of 103.1.  Report called to Shanon Brow, RN on [facility]. Transported to room # [phone] via w/c per this RN.  Port flushed, left intact

## 2017-08-31 NOTE — H&P (Signed)
History and Physical    Crystal Carlson JJK:[id number] DOB: 1966/03/24 DOA: 08/31/2017  Referring MD/NP/PA: Dr. Franchot Mimes PCP: Mack Hook, MD  Patient coming from: Direct admission  Chief Complaint: Fever  I have personally briefly reviewed patient's old medical records in [place]   HPI: Crystal Carlson is a 52 y.o. female with medical history significant of stage IV pancreatic cancer with biliary stent placement followed by Dr. Betsy Coder; who initially presented to oncology clinic with reports of fever up to 103 F at home since yesterday. Patient had taken Tylenol with only temporary relief of symptoms. he states that her youngest child has been sick with cough.  Expresses that he may have been diagnosed with the flu, and she states that she did not receive an  influenza vaccine.  Reports having some generalized abdominal pain that has been chronic and unchanged.  Denies any nausea, vomiting, diarrhea, or cough.  While at the clinic lab work was obtained which revealed white blood cell count of 4.5, hemoglobin 8.8, platelet count 93, potassium 3.1, AP 463, AST 42, ALT 102, and total bilirubin 1.1.  Blood and urine cultures were obtained and chest x-ray showed signs of acute disease.  She was given 2 g of cefepime and admitted to a MedSurg bed.  Patient is status post cycle 5 of FOLFIRINOX given on 08/24/2017.   ED Course: As seen above  Review of Systems  Constitutional: Positive for fever. Negative for malaise/fatigue.  HENT: Negative for ear discharge and nosebleeds.   Eyes: Negative for photophobia and pain.  Respiratory: Negative for cough and shortness of breath.   Cardiovascular: Negative for chest pain and leg swelling.  Gastrointestinal: Positive for abdominal pain (Chronic). Negative for nausea and vomiting.  Genitourinary: Negative for dysuria and hematuria.  Musculoskeletal: Negative for falls.  Skin: Negative for itching and rash.  Neurological: Negative for sensory  change and focal weakness.  Endo/Heme/Allergies: Negative for polydipsia. Does not bruise/bleed easily.  Psychiatric/Behavioral: Negative for substance abuse.    Past Medical History:  Diagnosis Date  . Cancer of pancreas ([place]) 05/26/2017  . Chronic pancreatitis ([place]) 05/19/2017  . Microcytic anemia 05/19/2017  . Prediabetes 2016   when living in [place]    Past Surgical History:  Procedure Laterality Date  . ERCP N/A 06/09/2017   Procedure: ENDOSCOPIC RETROGRADE CHOLANGIOPANCREATOGRAPHY (ERCP);  Surgeon: Clarene Essex, MD;  Location: [facility];  Service: Endoscopy;  Laterality: N/A;  . IR FLUORO GUIDE PORT INSERTION RIGHT  06/04/2017  . IR US GUIDE VASC ACCESS RIGHT  06/04/2017     reports that  has never smoked. she has never used smokeless tobacco. She reports that she does not drink alcohol or use drugs.  No Known Allergies  Family History  Problem Relation Age of Onset  . Hypertension Mother   . Arthritis Mother   . Diabetes Father   . Heart disease Father        ?valvular problems  . Hyperlipidemia Son   . Diabetes Brother     Prior to Admission medications   Medication Sig Start Date End Date Taking? Authorizing Provider  cetirizine (ZYRTEC) 10 MG tablet Take 10 mg by mouth See admin instructions. Takes for 3 days follow chemotherapy     [provider]  dexamethasone (DECADRON) 4 MG tablet TK 1 T PO BID FOR 3 DAYS AFTER EACH CYCLE OF CHEMO 08/26/17   Ladell Pier, MD  ferrous sulfate 325 (65 FE) MG EC tablet Take 1 tablet (325  mg total) by mouth 3 (three) times daily with meals. 06/18/17   Ladell Pier, MD  hydrocortisone (ANUSOL-HC) 2.5 % rectal cream Apply 1 application topically 4 (four) times daily as needed for hemorrhoids. 07/27/17   Theodis Blaze, MD  hydrocortisone cream 1 % Apply 1 application topically 3 (three) times daily as needed for itching (minor skin irritation). 07/27/17   Theodis Blaze, MD  HYDROmorphone (DILAUDID) 4 MG tablet  Take 1-2 tablets (4-8 mg total) by mouth every 4 (four) hours as needed for severe pain. 07/27/17   Theodis Blaze, MD  lidocaine-prilocaine (EMLA) cream Apply to port site one hour prior to use. Do not rub in. Cover with plastic. Patient taking differently: Apply 1 application daily as needed topically. Apply to port site one hour prior to use. Do not rub in. Cover with plastic. 06/03/17   Ladell Pier, MD  loperamide (IMODIUM) 2 MG capsule Take 1 capsule (2 mg total) by mouth as needed for diarrhea or loose stools (max 8 tabs per day). 08/12/17   Owens Shark, NP  LORazepam (ATIVAN) 0.5 MG tablet Take 1 tablet (0.5 mg total) by mouth every 8 (eight) hours as needed for anxiety (or nausea). 07/16/17   Owens Shark, NP  metoCLOPramide (REGLAN) 5 MG tablet Take 1 tablet (5 mg total) by mouth 3 (three) times daily before meals. 08/24/17   Owens Shark, NP  morphine (MS CONTIN) 15 MG 12 hr tablet Take 1 tablet (15 mg total) by mouth every 12 (twelve) hours as needed for pain (breakthrough pain). 07/27/17   Theodis Blaze, MD  pantoprazole (PROTONIX) 40 MG tablet Take 1 tablet (40 mg total) by mouth daily. 07/16/17   Owens Shark, NP  polyethylene glycol [facility] / GLYCOLAX) packet Take 17 g by mouth daily as needed for mild constipation or moderate constipation. 07/27/17   Theodis Blaze, MD  potassium chloride SA (K-DUR,KLOR-CON) 20 MEQ tablet Take 1 tablet (20 mEq total) by mouth daily. 07/24/17   Maryanna Shape, NP  prochlorperazine (COMPAZINE) 10 MG tablet Take 1 tablet (10 mg total) by mouth every 6 (six) hours as needed for nausea or vomiting. Patient not taking: Reported on 08/12/2017 07/07/17   Ladell Pier, MD    Physical Exam:  Constitutional: Middle-aged female in NAD, calm, comfortable Vitals:   08/31/17 2057 08/31/17 2250  BP: 126/87   Pulse: 99   Resp: 16   Temp: (!) 102.9 F (39.4 C) 100.2 F (37.9 C)  TempSrc: Oral Oral  SpO2: 99%    Eyes: PERRL, lids and  conjunctivae normal ENMT: Mucous membranes are moist. Posterior pharynx clear of any exudate or lesions. Normal dentition.  Neck: normal, supple, no masses, no thyromegaly Respiratory: clear to auscultation bilaterally, no wheezing, no crackles. Normal respiratory effort. No accessory muscle use.  Cardiovascular: Tachycardic, no murmurs / rubs / gallops. No extremity edema. 2+ pedal pulses. No carotid bruits.  Abdomen: Mild generalized tenderness to palpation, no masses palpated. No hepatosplenomegaly. Bowel sounds positive.  Musculoskeletal: no clubbing / cyanosis. No joint deformity upper and lower extremities. Good ROM, no contractures. Normal muscle tone.  Skin: no rashes, lesions, ulcers. No induration Neurologic: CN 2-12 grossly intact. Sensation intact, DTR normal. Strength 5/5 in all 4.  Psychiatric: Normal judgment and insight. Alert and oriented x 3. Normal mood.     Labs on Admission: I have personally reviewed following labs and imaging studies  CBC: Recent Labs  Lab 08/31/17  1350  NEUTROABS 3.8  HCT 27.5*  MCV 56.4   Basic Metabolic Panel: Recent Labs  Lab 08/31/17 1350  NA 138  K 3.1*  CL 104  CO2 23  GLUCOSE 118  BUN 9  CALCIUM 8.8   GFR: Estimated Creatinine Clearance: 70 mL/min (by C-G formula based on SCr of 0.75 mg/dL). Liver Function Tests: Recent Labs  Lab 08/31/17 1350  AST 42*  ALT 102*  ALKPHOS 463*  BILITOT 1.1  PROT 7.1  ALBUMIN 2.9*   No results for input(s): LIPASE, AMYLASE in the last 168 hours. No results for input(s): AMMONIA in the last 168 hours. Coagulation Profile: No results for input(s): INR, PROTIME in the last 168 hours. Cardiac Enzymes: No results for input(s): CKTOTAL, CKMB, CKMBINDEX, TROPONINI in the last 168 hours. BNP (last 3 results) No results for input(s): PROBNP in the last 8760 hours. HbA1C: No results for input(s): HGBA1C in the last 72 hours. CBG: No results for input(s): GLUCAP in the last 168  hours. Lipid Profile: No results for input(s): CHOL, HDL, LDLCALC, TRIG, CHOLHDL, LDLDIRECT in the last 72 hours. Thyroid Function Tests: No results for input(s): TSH, T4TOTAL, FREET4, T3FREE, THYROIDAB in the last 72 hours. Anemia Panel: No results for input(s): VITAMINB12, FOLATE, FERRITIN, TIBC, IRON, RETICCTPCT in the last 72 hours. Urine analysis:    Component Value Date/Time   COLORURINE YELLOW 06/21/2017 0228   APPEARANCEUR CLEAR 06/21/2017 0228   LABSPEC 1.020 07/24/2017 0847   PHURINE 6.5 07/24/2017 0847   PHURINE 5.0 06/21/2017 0228   GLUCOSEU Negative 07/24/2017 0847   HGBUR Moderate 07/24/2017 0847   HGBUR MODERATE (A) 06/21/2017 0228   BILIRUBINUR Negative 07/24/2017 0847   KETONESUR Negative 07/24/2017 0847   KETONESUR NEGATIVE 06/21/2017 0228   PROTEINUR 30 07/24/2017 0847   PROTEINUR NEGATIVE 06/21/2017 0228   UROBILINOGEN 0.2 07/24/2017 0847   NITRITE Negative 07/24/2017 0847   NITRITE NEGATIVE 06/21/2017 0228   LEUKOCYTESUR Negative 07/24/2017 0847   Sepsis Labs: Recent Results (from the past 240 hour(s))  Culture, Blood     Status: None (Preliminary result)   Collection Time: 08/31/17  2:00 PM  Result Value Ref Range Status   Specimen Description   Final    BLOOD LEFT ARM Performed at [facility], [place] [address] [place], [place] [address]    Special Requests   Final    BOTTLES DRAWN AEROBIC AND ANAEROBIC Blood Culture adequate volume   Culture PENDING  Incomplete   Report Status PENDING  Incomplete     Radiological Exams on Admission: Dg Chest 2 View  Result Date: 08/31/2017 CLINICAL DATA:  Metastatic pancreatic cancer. EXAM: CHEST  2 VIEW COMPARISON:  07/24/2017 FINDINGS: Heart size is normal. Mediastinal shadows are normal. Power port on the right has its tip in the SVC as seen previously. The lungs are clear. The vascularity is normal. No effusions. No bone abnormality. Right upper quadrant stent appears unchanged.  IMPRESSION: No active cardiopulmonary disease. Power port tip in the SVC above the right atrium. Electronically Signed   By: Nelson Chimes M.D.   On: 08/31/2017 15:12    Chest x-ray: Independently reviewed.  No clear signs of infection  Assessment/Plan SIRS/Sepsis: Acute. Patient presents febrile up to 103.1 F with heart rates 107-116.  Blood cultures have been obtained and chest x-ray otherwise appeared to be clear.  The patient's son is reported to possibly been diagnosed with the flu. - Admit to MedSurg bed - Sepsis protocol initiated - Follow-up  blood cultures - F/u Respiratory virus panel - Empirically started Tamiflu - Continue empiric antibiotics of Vancomycin and cefepime for now  Hypokalemia: Acute.  Initial potassium 3.1 on admission. - Give 40 mEq of potassium chloride x1 dose now - Continue to monitor and replace as needed  Antineoplastic chemotherapy induced pancytopenia: Chronic.  Hemoglobin 8.8 on admission appears to be near patient's baseline. Platelet count 93 on admission.  No reports of bleeding. - Recheck CBC in a.m.  Pancreatic cancer with liver metastases: Patient followed by Dr. Benay Spice in outpatient setting.  Currently on chemotherapy FOLFIRINOX last given on 08/24/2017. - Dr. Gearldine Shown added to patient's treatment team  Elevated LFTs: Patient found to have alkaline phosphatase 463, AST 42, and ALT 102.  Labs relatively similar to previous checks. - Continue to monitor  DVT prophylaxis: SCDs Code Status: Full  Family Communication: Discussed plan of care with the patient family present at bedside Disposition Plan: Likely discharge home in 1-2 days Consults called:  none Admission status: inpatient  Norval Morton MD [facility] Pager [phone]   If 7PM-7AM, please contact night-coverage [email or web address] Password [email or web address]  08/31/2017, 8:02 PM

## 2017-08-31 NOTE — Telephone Encounter (Signed)
Pt and husband came to clinic to report fever greater than 102 since yesterday. Spoke with pt in lobby, she reports intermittent shaking chills. Husband stated she has been taking Tylenol but fever returns in 4-5 hours. Dr. Benay Spice notified: Work in to [facility] today. Discussed pt with Sandi Mealy, PA: Orders received for lab and chest XRay. Pt and husband informed of plan. They will go to Xray after lab is drawn then report back to office.

## 2017-08-31 NOTE — Progress Notes (Signed)
Pharmacy Antibiotic Note  Crystal Carlson is a 52 y.o. female admitted on 08/31/2017 with fever of unknown origin.  PMH significant for metastatic pancreatic cancer, undergoing chemotherapy.  Cefepime 2gm IV x 1 given @ [facility] and patient transferred to [facility].   Pharmacy has been consulted for Vancomycin and Cefepime dosing.  Plan:  Cefepime 1gm IV q8h  Vancomycin 1gm IV q24h for goal AUC 400-500 (calculated AUC 461.7 using SCr 0.78)  F/U cultures and sensitivities    Temp (24hrs), Avg:103.1 F (39.5 C), Min:103 F (39.4 C), Max:103.1 F (39.5 C)  No results for input(s): WBC, CREATININE, LATICACIDVEN, VANCOTROUGH, VANCOPEAK, VANCORANDOM, GENTTROUGH, GENTPEAK, GENTRANDOM, TOBRATROUGH, TOBRAPEAK, TOBRARND, AMIKACINPEAK, AMIKACINTROU, AMIKACIN in the last 168 hours.  Estimated Creatinine Clearance: 70 mL/min (by C-G formula based on SCr of 0.75 mg/dL).    No Known Allergies  Antimicrobials this admission: 1/14 Cefepime >>   1/14 Vanc >>    Dose adjustments this admission:    Microbiology results: 1/14 BCx: sent 1/14 Resp Panel: sent   Thank you for allowing pharmacy to be a part of this patient's care.  Everette Rank, PharmD 08/31/2017 8:47 PM

## 2017-08-31 NOTE — Progress Notes (Signed)
[facility]pt[facility] Management Clinic Progress Note   Crystal Carlson [id number] 1965/09/28 52 y.o.  Crystal Carlson is managed by Dr. Ladell Pier  Actively treated with chemotherapy: yes  Current Therapy: FOLFIRINOX  Last Treated: 08/24/2017  Assessment: Plan:    Fever in other diseases - Plan: ceFEPIme (MAXIPIME) 2 g in dextrose 5 % 50 mL IVPB, 0.9 %  sodium chloride infusion, acetaminophen (TYLENOL) tablet 650 mg  Sepsis, due to unspecified organism [facility])  Pancreatic cancer metastasized to liver ([place])   Fever with sepsis: The patient was given cefepime 2 g IV x1 while in clinic.  She is being admitted to the care of Dr. Vance Gather.  I discussed with Dr. Bonner Puna that the patient would need an imaging study to assess the patency of her biliary stent.  Pancreatic cancer metastasized to the liver: The patient is status post cycle 5 of FOLFIRINOX dosed on 08/24/2017.  Please see After Visit Summary for patient specific instructions.  Future Appointments  Date Time Provider [place]  09/07/2017  8:15 AM [facility] LAB 3 [facility] None  09/07/2017  8:30 AM [facility] J32 DNS [facility] None  09/07/2017  9:00 AM Ladell Pier, MD [facility] None  09/07/2017  9:45 AM [facility] H28 [facility] None  09/09/2017 12:00 PM [facility] FLUSH NURSE [facility] None  09/21/2017  7:45 AM [facility] LAB 4 [facility] None  09/21/2017  8:00 AM [facility] PROCEDURE 2 [facility] None  09/21/2017  8:30 AM Ladell Pier, MD [facility] None  09/21/2017  9:30 AM [facility] H28 [facility] None  09/23/2017 12:00 PM [facility] FLUSH NURSE 2 [facility] None    No orders of the defined types were placed in this encounter.      Subjective:   Patient ID:  Crystal Carlson is a 52 y.o. (DOB 04/04/1966) female.  Chief Complaint:  Chief Complaint  Patient presents with  . Fever    HPI Crystal Carlson is a 52 year old female with a diagnosis of a metastatic pancreatic cancer with a pancreatic neck mass  with encasement of the hepatic, splenic, and superior mesenteric arteries with liver lesions.  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  She was originally diagnosed in October 2018.  She was treated with cycle 1 of FOLFIRINOX on 06/18/2017 and completed cycle 5 most recently on 08/24/2017.  On 06/08/2017 she was noted to have elevated liver enzymes and bilirubin which was believed likely an early manifestation of biliary obstruction secondary to pancreatic cancer.  On 06/09/2018 she had an ERCP completed with placement of a common bile duct stent.  She was admitted for fevers on 07/24/2017 with no source of infection noted.  She presents her office today stating that she had a fever of 101.9 at home last evening.  She has noted that her stools are light in color and that her urine is dark.  She denies nausea, vomiting, or diarrhea.  She states that her appetite is stable.  She denies any abdominal pain.  Plans are to admit Crystal Carlson to [facility] for IV antibiotics and imaging studies to assess the patency of her biliary stent.  Dr. Vance Gather graciously agreed to accept Crystal Carlson as his patient.  Medications: I have reviewed the patient's current medications.  Allergies: No Known Allergies  Past Medical History:  Diagnosis Date  . Cancer of pancreas ([place]) 05/26/2017  . Chronic pancreatitis ([place]) 05/19/2017  . Microcytic anemia 05/19/2017  . Prediabetes 2016  when living in [place]    Past Surgical History:  Procedure Laterality Date  . ERCP N/A 06/09/2017   Procedure: ENDOSCOPIC RETROGRADE CHOLANGIOPANCREATOGRAPHY (ERCP);  Surgeon: Clarene Essex, MD;  Location: [facility];  Service: Endoscopy;  Laterality: N/A;  . IR FLUORO GUIDE PORT INSERTION RIGHT  06/04/2017  . IR US GUIDE VASC ACCESS RIGHT  06/04/2017    Family History  Problem Relation Age of Onset  . Hypertension Mother   . Arthritis Mother   .  Diabetes Father   . Heart disease Father        ?valvular problems  . Hyperlipidemia Son   . Diabetes Brother     Social History   Socioeconomic History  . Marital status: Married    Spouse Carlson: Not on file  . Number of children: 3  . Years of education: college grad--civics--states equivalent to High school here.  . Highest education level: Not on file  Social Needs  . Financial resource strain: Not on file  . Food insecurity - worry: Not on file  . Food insecurity - inability: Not on file  . Transportation needs - medical: Not on file  . Transportation needs - non-medical: Not on file  Occupational History  . 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 Smoking status: Never Smoker  . Smokeless tobacco: Never Used  Substance and Sexual Activity  . Alcohol use: No  . Drug use: No  . Sexual activity: Not on file  Other Topics Concern  . Not on file  Social History Narrative   Originally from [place]   Moved to [place] In 74   Husband came in 2000   Lives at home with husband, 3 children and mother and father in Sports coach.    Past Medical History, Surgical history, Social history, and Family history were reviewed and updated as appropriate.   Please see review of systems for further details on the patient's review from today.   Review of Systems:  Review of Systems  Constitutional: Positive for chills, fatigue and fever. Negative for appetite change and diaphoresis.  HENT: Negative for postnasal drip, sinus pressure, sinus pain and trouble swallowing.   Respiratory: Negative for cough, chest tightness, shortness of breath and wheezing.   Cardiovascular: Negative for chest pain.  Gastrointestinal: Negative for abdominal distention, abdominal pain, constipation and diarrhea.       Light colored stools  Genitourinary: Negative for difficulty urinating.       Dark colored urine  Skin: Negative for color change.    Objective:   Physical  Exam:  BP (!) 137/96 (BP Location: Right Arm, Patient Position: Sitting)   Pulse (!) 107   Temp (!) 103.1 F (39.5 C) (Oral)   Resp 19   Ht 5\' 4"  (1.626 m)   Wt 114 lb 14.4 oz (52.1 kg)   SpO2 100%   BMI 19.72 kg/m  ECOG: 2  Physical Exam  Constitutional:  The patient is an adult who appears to be chronically ill but in no acute distress.  HENT:  Head: Normocephalic and atraumatic.  Cardiovascular: S1 normal and S2 normal. Tachycardia present.  Pulmonary/Chest: Effort normal and breath sounds normal. No respiratory distress. She has no wheezes. She has no rales.  Abdominal: Soft. Bowel sounds are normal. She exhibits no distension. There is no tenderness. There is no rebound and no guarding.  Neurological: She is alert.  Skin: Skin is warm and dry. She is not diaphoretic.  Lab Review:     Component Value Date/Time   NA 138 08/31/2017 1350   NA 141 08/07/2017 1205   K 3.1 (L) 08/31/2017 1350   K 4.2 08/07/2017 1205   CL 104 08/31/2017 1350   CO2 23 08/31/2017 1350   CO2 25 08/07/2017 1205   GLUCOSE 118 08/31/2017 1350   GLUCOSE 114 08/07/2017 1205   BUN 9 08/31/2017 1350   BUN 9.6 08/07/2017 1205   CREATININE 0.75 08/24/2017 0946   CREATININE 0.8 08/07/2017 1205   CALCIUM 8.8 08/31/2017 1350   CALCIUM 9.1 08/07/2017 1205   PROT 7.1 08/31/2017 1350   PROT 6.7 08/07/2017 1205   ALBUMIN 2.9 (L) 08/31/2017 1350   ALBUMIN 3.0 (L) 08/07/2017 1205   AST 42 (H) 08/31/2017 1350   AST 58 (H) 08/07/2017 1205   ALT 102 (H) 08/31/2017 1350   ALT 59 (H) 08/07/2017 1205   ALKPHOS 463 (H) 08/31/2017 1350   ALKPHOS 520 (H) 08/07/2017 1205   BILITOT 1.1 08/31/2017 1350   BILITOT 0.42 08/07/2017 1205   GFRNONAA >60 08/24/2017 0946   GFRAA >60 08/24/2017 0946       Component Value Date/Time   WBC 8.4 08/24/2017 0946   RBC 3.26 (L) 08/31/2017 1350   HGB 8.3 (L) 08/24/2017 0946   HGB 9.5 (L) 08/07/2017 1205   HCT 27.5 (L) 08/31/2017 1350   HCT 30.0 (L) 08/07/2017  1205   PLT 121 (L) 08/24/2017 0946   PLT 138 (L) 08/07/2017 1205   PLT 117 (L) 05/08/2017 1557   MCV 84.5 08/31/2017 1350   MCV 84.5 08/07/2017 1205   MCH 27.1 08/31/2017 1350   MCHC 32.0 08/31/2017 1350   RDW 24.2 (H) 08/31/2017 1350   RDW 25.0 (H) 08/07/2017 1205   LYMPHSABS 0.3 (L) 08/31/2017 1350   LYMPHSABS 1.2 08/07/2017 1205   MONOABS 0.4 08/31/2017 1350   MONOABS 0.7 08/07/2017 1205   EOSABS 0.0 08/31/2017 1350   EOSABS 0.0 08/07/2017 1205   EOSABS 0.2 05/08/2017 1557   BASOSABS 0.0 08/31/2017 1350   BASOSABS 0.1 08/07/2017 1205   -------------------------------  Imaging from last 24 hours (if applicable):  Radiology interpretation: Dg Chest 2 View  Result Date: 08/31/2017 CLINICAL DATA:  Metastatic pancreatic cancer. EXAM: CHEST  2 VIEW COMPARISON:  07/24/2017 FINDINGS: Heart size is normal. Mediastinal shadows are normal. Power port on the right has its tip in the SVC as seen previously. The lungs are clear. The vascularity is normal. No effusions. No bone abnormality. Right upper quadrant stent appears unchanged. IMPRESSION: No active cardiopulmonary disease. Power port tip in the SVC above the right atrium. Electronically Signed   By: Nelson Chimes M.D.   On: 08/31/2017 15:12        This patient was seen with Dr. Benay Spice with my treatment plan reviewed with him. He expressed agreement with my medical management of this patient.  This was a shared visit with Sandi Mealy.  Ms. Axley was interviewed and examined.  She presents with a high fever at day 8 following FOLFIRINOX chemotherapy.  She is not neutropenic.  She does not have symptoms to suggest a source for infection.  She may have a biliary tract infection or less likely port related infection.  Cultures were obtained at the Cancer center.  She will be admitted for broad-spectrum intravenous antibiotics and further evaluation.  [name], MD

## 2017-09-01 ENCOUNTER — Inpatient Hospital Stay (HOSPITAL_COMMUNITY): Payer: Medicaid Other

## 2017-09-01 ENCOUNTER — Encounter: Payer: Self-pay | Admitting: *Deleted

## 2017-09-01 ENCOUNTER — Encounter (HOSPITAL_COMMUNITY): Payer: Self-pay | Admitting: Radiology

## 2017-09-01 DIAGNOSIS — A419 Sepsis, unspecified organism: Secondary | ICD-10-CM

## 2017-09-01 DIAGNOSIS — R5081 Fever presenting with conditions classified elsewhere: Secondary | ICD-10-CM

## 2017-09-01 DIAGNOSIS — R197 Diarrhea, unspecified: Secondary | ICD-10-CM

## 2017-09-01 DIAGNOSIS — R7881 Bacteremia: Secondary | ICD-10-CM

## 2017-09-01 DIAGNOSIS — B961 Klebsiella pneumoniae [K. pneumoniae] as the cause of diseases classified elsewhere: Secondary | ICD-10-CM

## 2017-09-01 DIAGNOSIS — R6510 Systemic inflammatory response syndrome (SIRS) of non-infectious origin without acute organ dysfunction: Secondary | ICD-10-CM | POA: Diagnosis present

## 2017-09-01 DIAGNOSIS — C257 Malignant neoplasm of other parts of pancreas: Secondary | ICD-10-CM

## 2017-09-01 LAB — RESPIRATORY PANEL BY PCR
Adenovirus: NOT DETECTED
Bordetella pertussis: NOT DETECTED
CORONAVIRUS 229E-RVPPCR: NOT DETECTED
CORONAVIRUS HKU1-RVPPCR: NOT DETECTED
CORONAVIRUS NL63-RVPPCR: NOT DETECTED
CORONAVIRUS OC43-RVPPCR: NOT DETECTED
Chlamydophila pneumoniae: NOT DETECTED
Influenza A: NOT DETECTED
Influenza B: NOT DETECTED
METAPNEUMOVIRUS-RVPPCR: NOT DETECTED
Mycoplasma pneumoniae: NOT DETECTED
PARAINFLUENZA VIRUS 1-RVPPCR: NOT DETECTED
PARAINFLUENZA VIRUS 2-RVPPCR: NOT DETECTED
Parainfluenza Virus 3: NOT DETECTED
Parainfluenza Virus 4: NOT DETECTED
Respiratory Syncytial Virus: NOT DETECTED
Rhinovirus / Enterovirus: NOT DETECTED

## 2017-09-01 LAB — COMPREHENSIVE METABOLIC PANEL
ALBUMIN: 2.5 g/dL — AB (ref 3.5–5.0)
ALT: 62 U/L — ABNORMAL HIGH (ref 14–54)
AST: 26 U/L (ref 15–41)
Alkaline Phosphatase: 319 U/L — ABNORMAL HIGH (ref 38–126)
Anion gap: 7 (ref 5–15)
BUN: 9 mg/dL (ref 6–20)
CHLORIDE: 110 mmol/L (ref 101–111)
CO2: 22 mmol/L (ref 22–32)
Calcium: 7.9 mg/dL — ABNORMAL LOW (ref 8.9–10.3)
Creatinine, Ser: 0.57 mg/dL (ref 0.44–1.00)
GFR calc Af Amer: >60 mL/min (ref >60)
GFR calc non Af Amer: >60 mL/min (ref >60)
GLUCOSE: 114 mg/dL — AB (ref 65–99)
POTASSIUM: 3.7 mmol/L (ref 3.5–5.1)
SODIUM: 139 mmol/L (ref 135–145)
Total Bilirubin: 0.9 mg/dL (ref 0.3–1.2)
Total Protein: 5.7 g/dL — ABNORMAL LOW (ref 6.5–8.1)

## 2017-09-01 LAB — BLOOD CULTURE ID PANEL (REFLEXED)
ACINETOBACTER BAUMANNII: NOT DETECTED
CANDIDA ALBICANS: NOT DETECTED
CANDIDA GLABRATA: NOT DETECTED
CANDIDA KRUSEI: NOT DETECTED
CANDIDA TROPICALIS: NOT DETECTED
Candida parapsilosis: NOT DETECTED
Carbapenem resistance: NOT DETECTED
ESCHERICHIA COLI: NOT DETECTED
Enterobacter cloacae complex: NOT DETECTED
Enterobacteriaceae species: DETECTED — AB
Enterococcus species: NOT DETECTED
Haemophilus influenzae: NOT DETECTED
KLEBSIELLA OXYTOCA: NOT DETECTED
Klebsiella pneumoniae: DETECTED — AB
Listeria monocytogenes: NOT DETECTED
Neisseria meningitidis: NOT DETECTED
PROTEUS SPECIES: NOT DETECTED
Pseudomonas aeruginosa: NOT DETECTED
SERRATIA MARCESCENS: NOT DETECTED
STAPHYLOCOCCUS SPECIES: NOT DETECTED
STREPTOCOCCUS PNEUMONIAE: NOT DETECTED
Staphylococcus aureus (BCID): NOT DETECTED
Streptococcus agalactiae: NOT DETECTED
Streptococcus pyogenes: NOT DETECTED
Streptococcus species: NOT DETECTED

## 2017-09-01 LAB — CBC
HEMATOCRIT: 21.2 % — AB (ref 36.0–46.0)
Hemoglobin: 7 g/dL — ABNORMAL LOW (ref 12.0–15.0)
MCH: 28.3 pg (ref 26.0–34.0)
MCHC: 33 g/dL (ref 30.0–36.0)
MCV: 85.8 fL (ref 78.0–100.0)
PLATELETS: 63 10*3/uL — AB (ref 150–400)
RBC: 2.47 MIL/uL — ABNORMAL LOW (ref 3.87–5.11)
RDW: 22.7 % — AB (ref 11.5–15.5)
WBC: 4.2 10*3/uL (ref 4.0–10.5)

## 2017-09-01 MED ORDER — PIPERACILLIN-TAZOBACTAM 3.375 G IVPB
3.3750 g | Freq: Three times a day (TID) | INTRAVENOUS | Status: AC
  Administered 2017-09-01 – 2017-09-03 (×8): 3.375 g via INTRAVENOUS
  Filled 2017-09-01 (×8): qty 50

## 2017-09-01 MED ORDER — IOPAMIDOL (ISOVUE-300) INJECTION 61%
INTRAVENOUS | Status: AC
  Filled 2017-09-01: qty 30

## 2017-09-01 MED ORDER — IOPAMIDOL (ISOVUE-300) INJECTION 61%: 15.0000 mL | Freq: Once | INTRAVENOUS | Status: DC | PRN

## 2017-09-01 MED ORDER — LOPERAMIDE HCL 2 MG PO CAPS
2.0000 mg | ORAL_CAPSULE | ORAL | Status: DC | PRN
  Filled 2017-09-01: qty 1

## 2017-09-01 MED ORDER — IOPAMIDOL (ISOVUE-300) INJECTION 61%
INTRAVENOUS | Status: AC
  Filled 2017-09-01: qty 100

## 2017-09-01 MED ORDER — IOPAMIDOL (ISOVUE-300) INJECTION 61%: 100.0000 mL | Freq: Once | INTRAVENOUS | Status: DC | PRN

## 2017-09-01 MED ORDER — LORAZEPAM 2 MG/ML IJ SOLN: 0.5000 mg | Freq: Once | INTRAMUSCULAR | Status: DC | PRN

## 2017-09-01 MED ORDER — SODIUM CHLORIDE 0.9 % IV BOLUS (SEPSIS)
500.0000 mL | Freq: Once | INTRAVENOUS | Status: AC
  Administered 2017-09-01: 500 mL via INTRAVENOUS

## 2017-09-01 NOTE — Progress Notes (Unsigned)
Positive blood cultures given to Lavella Lemons, RN at 945 am by DCM.

## 2017-09-01 NOTE — Progress Notes (Signed)
CRITICAL VALUE ALERT  Critical Value:  hgb 7.0   Date & Time Notied:  1/15 0500 Provider Notified: yes  Orders Received/Actions taken: no

## 2017-09-01 NOTE — Progress Notes (Signed)
PROGRESS NOTE  Crystal Carlson  MGQ:[id number] DOB: May 23, 1966 DOA: 08/31/2017 PCP: Mack Hook, MD  Outpatient Specialists: Oncology, Retta Diones GI Brief Narrative: Crystal Carlson is a 52 y.o. female with a history of stage IV pancreatic CA s/p biliary stenting and s/p 5 cycles of FOLFIRINOX who presented to the oncology clinic 1/14 with fever. She complained of chronic, unchanged abdominal pain and that her children had flu-like illness, but denied any N/V/D. She appeared dehydrated and febrile. While at the clinic lab work was obtained which revealed white blood cell count of 4.5, hemoglobin 8.8, platelet count 93, potassium 3.1, AP 463, AST 42, ALT 102, and total bilirubin 1.1.  Blood and urine cultures were obtained and chest x-ray showed signs of acute disease.  She was given 2 g of cefepime and admitted to a MedSurg bed. Vancomycin and empiric tamiflu added. Blood culture from the clinic shows GNR's.   Assessment & Plan: Active Problems:   Elevated LFTs   Pancreatic cancer metastasized to liver ([facility])   Hypokalemia   Antineoplastic chemotherapy induced pancytopenia ([facility])   Fever of unknown origin   SIRS (systemic inflammatory response syndrome) ([facility])  GNR bacteremia with sepsis: Suspicion for intraabdominal process. Lactate reassuring.  - Change to zosyn, ok to DC vancomycin.  - DC tamiflu - Monitor speciation from blood culture - Continue IVF's - HIDA scan as below.   Elevated LFT's:  - With history of biliary stenting and gram negative sepsis, though no definite obstruction, I've asked for GI evaluation. Dr. Penelope Coop has requested HIDA scan in lieu of CT abd/pelvis. Requests that he be called for consult if this is abnormal. - Trend CMP  Stage IV pancreatic CA s/p 5 cycle FOLFIRINOX (last 08/24/17)  - Appreciate Dr. Gearldine Shown involvement  Chemotherapy-induced anemia, thrombocytopenia: - Hgb 7 without active bleeding, not hypotensive. Because patient is not currently symptomatic  and has sepsis, will hold transfusion today, recheck in AM.  - Fortunately not current neutropenic.   DVT prophylaxis: SCDs Code Status: Full Family Communication: Family at bedside this AM Disposition Plan: Uncertain, pending further evaluation and treatment.   Consultants:   GI, Dr. Penelope Coop.   Procedures:   None  Antimicrobials:  Vancomycin 1/14  Cefepime 1/14 - 1/15  Zosyn 1/15 >>   Tamiflu 1/14 - 1/15  Subjective: Still with significant fever and chills, denies abdominal pain, N/V/D, dyspnea, cough, chest pain, any urinary symptoms, rash, arthralgias, headache.   Objective: BP (!) 127/91 (BP Location: Right Arm)   Pulse 93   Temp (!) 103 F (39.4 C) (Oral)   Resp 16   SpO2 99%   Gen: Chronically ill-appearing, currently shivering 52 y.o. female in no distress  Pulm: Non-labored breathing room air. Clear to auscultation bilaterally.  CV: Regular rate and rhythm. No murmur, rub, or gallop. No JVD, no pedal edema. GI: Abdomen soft, not significantly tender, no rebound, no guarding, non-distended, with normoactive bowel sounds. No organomegaly or masses felt. Ext: Warm, no deformities Skin: No rashes or wounds. Neuro: Alert and oriented. No focal neurological deficits. Psych: Judgement and insight appear normal. Mood & affect appropriate.   Data Reviewed: I have personally reviewed following labs and imaging studies  CBC: Recent Labs  Lab 08/31/17 1350 09/01/17 0316  WBC  --  4.2  NEUTROABS 3.8  --   HGB  --  7.0*  HCT 27.5* 21.2*  MCV 84.5 85.8  PLT  --  63*   Basic Metabolic Panel: Recent Labs  Lab 08/31/17 1350 09/01/17 0316  NA 138 139  K 3.1* 3.7  CL 104 110  CO2 23 22  GLUCOSE 118 114*  BUN 9 9  CREATININE  --  0.57  CALCIUM 8.8 7.9*   GFR: Estimated Creatinine Clearance: 70 mL/min (by C-G formula based on SCr of 0.57 mg/dL). Liver Function Tests: Recent Labs  Lab 08/31/17 1350 09/01/17 0316  AST 42* 26  ALT 102* 62*  ALKPHOS 463*  319*  BILITOT 1.1 0.9  PROT 7.1 5.7*  ALBUMIN 2.9* 2.5*   No results for input(s): LIPASE, AMYLASE in the last 168 hours. No results for input(s): AMMONIA in the last 168 hours. Coagulation Profile: No results for input(s): INR, PROTIME in the last 168 hours. Cardiac Enzymes: No results for input(s): CKTOTAL, CKMB, CKMBINDEX, TROPONINI in the last 168 hours. BNP (last 3 results) No results for input(s): PROBNP in the last 8760 hours. HbA1C: No results for input(s): HGBA1C in the last 72 hours. CBG: No results for input(s): GLUCAP in the last 168 hours. Lipid Profile: No results for input(s): CHOL, HDL, LDLCALC, TRIG, CHOLHDL, LDLDIRECT in the last 72 hours. Thyroid Function Tests: No results for input(s): TSH, T4TOTAL, FREET4, T3FREE, THYROIDAB in the last 72 hours. Anemia Panel: No results for input(s): VITAMINB12, FOLATE, FERRITIN, TIBC, IRON, RETICCTPCT in the last 72 hours. Urine analysis:    Component Value Date/Time   COLORURINE YELLOW 06/21/2017 0228   APPEARANCEUR CLEAR 06/21/2017 0228   LABSPEC 1.020 07/24/2017 0847   PHURINE 6.5 07/24/2017 0847   PHURINE 5.0 06/21/2017 0228   GLUCOSEU Negative 07/24/2017 0847   HGBUR Moderate 07/24/2017 0847   HGBUR MODERATE (A) 06/21/2017 0228   BILIRUBINUR Negative 07/24/2017 0847   KETONESUR Negative 07/24/2017 0847   KETONESUR NEGATIVE 06/21/2017 0228   PROTEINUR 30 07/24/2017 0847   PROTEINUR NEGATIVE 06/21/2017 0228   UROBILINOGEN 0.2 07/24/2017 0847   NITRITE Negative 07/24/2017 0847   NITRITE NEGATIVE 06/21/2017 0228   LEUKOCYTESUR Negative 07/24/2017 0847   Recent Results (from the past 240 hour(s))  Culture, Blood     Status: None (Preliminary result)   Collection Time: 08/31/17  2:00 PM  Result Value Ref Range Status   Specimen Description   Final    BLOOD LEFT ARM Performed at [facility], [place] [address], [place], [place] [address]    Special Requests   Final    BOTTLES DRAWN AEROBIC  AND ANAEROBIC Blood Culture adequate volume   Culture  Setup Time   Final    GRAM NEGATIVE RODS ANAEROBIC BOTTLE ONLY Organism ID to follow CRITICAL RESULT CALLED TO, READ BACK BY AND VERIFIED WITH: Debbra Riding MT 9:45 09/01/17 ([email or web address])    Culture GRAM NEGATIVE RODS  Final   Report Status PENDING  Incomplete  Blood Culture ID Panel (Reflexed)     Status: Abnormal   Collection Time: 08/31/17  2:00 PM  Result Value Ref Range Status   Enterococcus species NOT DETECTED NOT DETECTED Final   Listeria monocytogenes NOT DETECTED NOT DETECTED Final   Staphylococcus species NOT DETECTED NOT DETECTED Final   Staphylococcus aureus NOT DETECTED NOT DETECTED Final   Streptococcus species NOT DETECTED NOT DETECTED Final   Streptococcus agalactiae NOT DETECTED NOT DETECTED Final   Streptococcus pneumoniae NOT DETECTED NOT DETECTED Final   Streptococcus pyogenes NOT DETECTED NOT DETECTED Final   Acinetobacter baumannii NOT DETECTED NOT DETECTED Final   Enterobacteriaceae species DETECTED (A) NOT DETECTED Final    Comment: Enterobacteriaceae represent a large family of gram-negative bacteria, not  a single organism. CRITICAL RESULT CALLED TO, READ BACK BY AND VERIFIED WITH: Debbra Riding MT 9:45 09/01/17 ([email or web address])    Enterobacter cloacae complex NOT DETECTED NOT DETECTED Final   Escherichia coli NOT DETECTED NOT DETECTED Final   Klebsiella oxytoca NOT DETECTED NOT DETECTED Final   Klebsiella pneumoniae DETECTED (A) NOT DETECTED Final    Comment: CRITICAL RESULT CALLED TO, READ BACK BY AND VERIFIED WITH: Debbra Riding MT 9:45 09/01/17 ([email or web address])    Proteus species NOT DETECTED NOT DETECTED Final   Serratia marcescens NOT DETECTED NOT DETECTED Final   Carbapenem resistance NOT DETECTED NOT DETECTED Final   Haemophilus influenzae NOT DETECTED NOT DETECTED Final   Neisseria meningitidis NOT DETECTED NOT DETECTED Final   Pseudomonas aeruginosa NOT DETECTED NOT DETECTED Final   Candida albicans NOT DETECTED NOT  DETECTED Final   Candida glabrata NOT DETECTED NOT DETECTED Final   Candida krusei NOT DETECTED NOT DETECTED Final   Candida parapsilosis NOT DETECTED NOT DETECTED Final   Candida tropicalis NOT DETECTED NOT DETECTED Final  Respiratory Panel by PCR     Status: None   Collection Time: 08/31/17  8:20 PM  Result Value Ref Range Status   Adenovirus NOT DETECTED NOT DETECTED Final   Coronavirus 229E NOT DETECTED NOT DETECTED Final   Coronavirus HKU1 NOT DETECTED NOT DETECTED Final   Coronavirus NL63 NOT DETECTED NOT DETECTED Final   Coronavirus OC43 NOT DETECTED NOT DETECTED Final   Metapneumovirus NOT DETECTED NOT DETECTED Final   Rhinovirus / Enterovirus NOT DETECTED NOT DETECTED Final   Influenza A NOT DETECTED NOT DETECTED Final   Influenza B NOT DETECTED NOT DETECTED Final   Parainfluenza Virus 1 NOT DETECTED NOT DETECTED Final   Parainfluenza Virus 2 NOT DETECTED NOT DETECTED Final   Parainfluenza Virus 3 NOT DETECTED NOT DETECTED Final   Parainfluenza Virus 4 NOT DETECTED NOT DETECTED Final   Respiratory Syncytial Virus NOT DETECTED NOT DETECTED Final   Bordetella pertussis NOT DETECTED NOT DETECTED Final   Chlamydophila pneumoniae NOT DETECTED NOT DETECTED Final   Mycoplasma pneumoniae NOT DETECTED NOT DETECTED Final    Comment: Performed at [facility], [place]. [address], [place], [place] [address]  Culture, blood (x 2)     Status: None (Preliminary result)   Collection Time: 08/31/17  8:38 PM  Result Value Ref Range Status   Specimen Description BLOOD LEFT ANTECUBITAL  Final   Special Requests   Final    BOTTLES DRAWN AEROBIC AND ANAEROBIC Blood Culture adequate volume   Culture   Final    NO GROWTH < 12 HOURS Performed at [facility], [place] [address] [place], [place] [address]    Report Status PENDING  Incomplete  Culture, blood (x 2)     Status: None (Preliminary result)   Collection Time: 08/31/17  8:46 PM  Result Value Ref Range Status   Specimen  Description BLOOD RIGHT ANTECUBITAL  Final   Special Requests   Final    BOTTLES DRAWN AEROBIC AND ANAEROBIC Blood Culture adequate volume   Culture   Final    NO GROWTH < 12 HOURS Performed at [facility], [place] [address], [place], [place] [address]    Report Status PENDING  Incomplete      Radiology Studies: Dg Chest 2 View  Result Date: 08/31/2017 CLINICAL DATA:  Metastatic pancreatic cancer. EXAM: CHEST  2 VIEW COMPARISON:  07/24/2017 FINDINGS: Heart size is normal. Mediastinal shadows are normal. Power port on the  right has its tip in the SVC as seen previously. The lungs are clear. The vascularity is normal. No effusions. No bone abnormality. Right upper quadrant stent appears unchanged. IMPRESSION: No active cardiopulmonary disease. Power port tip in the SVC above the right atrium. Electronically Signed   By: Nelson Chimes M.D.   On: 08/31/2017 15:12    Scheduled Meds: . iopamidol      . oseltamivir  75 mg Oral BID   Continuous Infusions: . sodium chloride 100 mL/hr at 09/01/17 0519  . piperacillin-tazobactam (ZOSYN)  IV 3.375 g (09/01/17 1406)     LOS: 1 day   Time spent: 25 minutes.  Vance Gather, MD [facility] Pager [phone]  If 7PM-7AM, please contact night-coverage [email or web address] Password [facility] 09/01/2017, 3:04 PM

## 2017-09-01 NOTE — Progress Notes (Signed)
IP PROGRESS NOTE  Subjective:   She reports feeling better.  She had an episode of diarrhea this morning.  Objective: Vital signs in last 24 hours: Blood pressure (!) 127/91, pulse 93, temperature 98.7 F (37.1 C), temperature source Oral, resp. rate 16, SpO2 99 %.  Intake/Output from previous day: 01/14 0701 - 01/15 0700 In: 2950 [I.V.:700; IV Piggyback:2250] Out: -   Physical Exam:  HEENT: No thrush Lungs: Clear bilaterally Cardiac: Regular rate and rhythm Abdomen: No hepatomegaly, mild diffuse tenderness, no mass Extremities: No leg edema   Portacath/PICC-without erythema  Lab Results: Recent Labs    08/31/17 1350 09/01/17 0316  WBC  --  4.2  HGB  --  7.0*  HCT 27.5* 21.2*  PLT  --  63*    BMET Recent Labs    08/31/17 1350 09/01/17 0316  NA 138 139  K 3.1* 3.7  CL 104 110  CO2 23 22  GLUCOSE 118 114*  BUN 9 9  CREATININE  --  0.57  CALCIUM 8.8 7.9*    Lab Results  Component Value Date   CEA1 1.3 05/25/2017    Studies/Results: Dg Chest 2 View  Result Date: 08/31/2017 CLINICAL DATA:  Metastatic pancreatic cancer. EXAM: CHEST  2 VIEW COMPARISON:  07/24/2017 FINDINGS: Heart size is normal. Mediastinal shadows are normal. Power port on the right has its tip in the SVC as seen previously. The lungs are clear. The vascularity is normal. No effusions. No bone abnormality. Right upper quadrant stent appears unchanged. IMPRESSION: No active cardiopulmonary disease. Power port tip in the SVC above the right atrium. Electronically Signed   By: Nelson Chimes M.D.   On: 08/31/2017 15:12    Medications: I have reviewed the patient's current medications.  Assessment/Plan: 1. 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 Adenocarcinoma consistent with pancreatobiliary primary.  Cycle 1 FOLFIRINOX 06/18/2017  Cycle 2FOLFIRINOX 07/02/2017  Cycle 3 FOLFIRINOX 07/16/2017 (oxaliplatin dose reduced due to mild thrombocytopenia)  CT 07/24/2017-new splenomegaly and left hydronephrosis  Cycle 4 FOLFIRINOX 08/08/2017  Cycle 5 FOLFIRINOX 08/24/2017  2. Pain secondary to #1.Improved.  3. Weight losssecondary to #1.  4. Microcytic anemia  Transfused with 1 unit of packed red blood cells 07/25/2017  5. Mild thrombocytopenia, progressive going chemotherapy, improved  6. Port-A-Cath placement 06/04/2017  7. Elevated liver enzymes/bilirubin10/22/2018-likely early biliary obstruction secondary to pancreas cancer, status post ERCP-placement of a common bile duct stent 06/09/2018  8.Postprandial abdominal pain. Protonix initiated 07/16/2017.  Trial of Reglan initiated 08/24/2017.  9.Admission with fever 07/24/2017-no source for infection identified, resolved  10.Diarrheafollowing cycle 3 FOLFIRINOX-resolved  11.Left hydronephrosis noted on CT12/03/2017-etiology unclear, urology consulted-stent not recommended  12.  Admission 08/31/2017 with a high fever, blood culture positive for gram-negative rods-Klebsiella pneumoniae identified  Ms. Carbonell was admitted yesterday with a high fever at day 8 following cycle 5 FOLFIRINOX.  A blood culture is growing Klebsiella pneumoniae.  The source for infection may be related to the biliary stent, urinary tract, or bacteria translocation from the GI tract.  She has diarrhea this morning, likely related to chemotherapy.  She was to be scheduled for restaging CTs 09/04/2017.  The CT has  been ordered for today  Recommendations: 1.  Continue Zosyn, follow-up on  sensitivity testing on the gram-negative rod 2.  Daily CBC with differential 3.  Consider GI consult to evaluate the bile duct stent for persistent fever 4.  Imodium for diarrhea 5.  CT abdomen/pelvis has been ordered for today, I will follow-up on the images  I appreciate the care from the medical service.  I will continue following her in the hospital.   LOS: 1 day   Betsy Coder, MD   09/01/2017, 2:13 PM

## 2017-09-01 NOTE — Progress Notes (Signed)
These preliminary result these preliminary results were noted.  Awaiting final report.

## 2017-09-02 ENCOUNTER — Inpatient Hospital Stay (HOSPITAL_COMMUNITY): Payer: Medicaid Other

## 2017-09-02 DIAGNOSIS — D649 Anemia, unspecified: Secondary | ICD-10-CM

## 2017-09-02 DIAGNOSIS — R509 Fever, unspecified: Secondary | ICD-10-CM

## 2017-09-02 DIAGNOSIS — D6481 Anemia due to antineoplastic chemotherapy: Secondary | ICD-10-CM

## 2017-09-02 LAB — COMPREHENSIVE METABOLIC PANEL
ALBUMIN: 2.3 g/dL — AB (ref 3.5–5.0)
ALT: 45 U/L (ref 14–54)
AST: 17 U/L (ref 15–41)
Alkaline Phosphatase: 305 U/L — ABNORMAL HIGH (ref 38–126)
Anion gap: 5 (ref 5–15)
BILIRUBIN TOTAL: 0.8 mg/dL (ref 0.3–1.2)
BUN: 8 mg/dL (ref 6–20)
CO2: 23 mmol/L (ref 22–32)
CREATININE: 0.7 mg/dL (ref 0.44–1.00)
Calcium: 8.2 mg/dL — ABNORMAL LOW (ref 8.9–10.3)
Chloride: 108 mmol/L (ref 101–111)
GFR calc Af Amer: >60 mL/min (ref >60)
GLUCOSE: 105 mg/dL — AB (ref 65–99)
Potassium: 2.9 mmol/L — ABNORMAL LOW (ref 3.5–5.1)
Sodium: 136 mmol/L (ref 135–145)
TOTAL PROTEIN: 5.8 g/dL — AB (ref 6.5–8.1)

## 2017-09-02 LAB — CBC WITH DIFFERENTIAL/PLATELET
BASOS ABS: 0 10*3/uL (ref 0.0–0.1)
Basophils Relative: 0 %
EOS PCT: 1 %
Eosinophils Absolute: 0 10*3/uL (ref 0.0–0.7)
HEMATOCRIT: 20 % — AB (ref 36.0–46.0)
HEMOGLOBIN: 6.6 g/dL — AB (ref 12.0–15.0)
LYMPHS PCT: 17 %
Lymphs Abs: 0.7 10*3/uL (ref 0.7–4.0)
MCH: 28.1 pg (ref 26.0–34.0)
MCHC: 33 g/dL (ref 30.0–36.0)
MCV: 85.1 fL (ref 78.0–100.0)
MONOS PCT: 15 %
Monocytes Absolute: 0.7 10*3/uL (ref 0.1–1.0)
NEUTROS PCT: 67 %
Neutro Abs: 3 10*3/uL (ref 1.7–7.7)
Platelets: 61 10*3/uL — ABNORMAL LOW (ref 150–400)
RBC: 2.35 MIL/uL — AB (ref 3.87–5.11)
RDW: 22.3 % — ABNORMAL HIGH (ref 11.5–15.5)
WBC: 4.4 10*3/uL (ref 4.0–10.5)

## 2017-09-02 LAB — PREPARE RBC (CROSSMATCH)

## 2017-09-02 LAB — MAGNESIUM: MAGNESIUM: 1.7 mg/dL (ref 1.7–2.4)

## 2017-09-02 MED ORDER — SODIUM CHLORIDE 0.9 % IV SOLN
Freq: Once | INTRAVENOUS | Status: AC
  Administered 2017-09-02: 10:00:00 via INTRAVENOUS

## 2017-09-02 MED ORDER — POTASSIUM CHLORIDE CRYS ER 20 MEQ PO TBCR: 40.0000 meq | EXTENDED_RELEASE_TABLET | Freq: Two times a day (BID) | ORAL | Status: DC

## 2017-09-02 MED ORDER — TECHNETIUM TC 99M MEBROFENIN IV KIT
5.1000 | PACK | Freq: Once | INTRAVENOUS | Status: AC | PRN
  Administered 2017-09-02: 5.1 via INTRAVENOUS

## 2017-09-02 MED ORDER — SODIUM CHLORIDE 0.9 % IV SOLN: Freq: Once | INTRAVENOUS | Status: DC

## 2017-09-02 MED ORDER — POTASSIUM CHLORIDE CRYS ER 20 MEQ PO TBCR
40.0000 meq | EXTENDED_RELEASE_TABLET | Freq: Two times a day (BID) | ORAL | Status: AC
  Administered 2017-09-02 (×2): 40 meq via ORAL
  Filled 2017-09-02 (×2): qty 2

## 2017-09-02 NOTE — Care Management Note (Signed)
Case Management Note  Patient Details  Name: Terriana Barreras MRN: [id number] Date of Birth: [birth year]  Subjective/Objective:                  sepsis  Action/Plan: Date:  September 02, 2017 Chart reviewed for concurrent status and case management needs.  Will continue to follow patient progress.  Discharge Planning: following for needs.  None present at this time of review. Expected discharge date: September 05 2017 Velva Harman, BSN, [place], [place]   Expected Discharge Date:                  Expected Discharge Plan:  Home/Self Care  In-House Referral:     Discharge planning Services  CM Consult  Post Acute Care Choice:    Choice offered to:     DME Arranged:    DME Agency:     HH Arranged:    HH Agency:     Status of Service:  In process, will continue to follow  If discussed at Long Length of Stay Meetings, dates discussed:    Additional Comments:  Leeroy Cha, RN 09/02/2017, 9:18 AM

## 2017-09-02 NOTE — Progress Notes (Signed)
PROGRESS NOTE  Crystal Carlson  ZHY:[id number] DOB: 1966-06-28 DOA: 08/31/2017 PCP: Mack Hook, MD  Outpatient Specialists: Oncology, Retta Diones GI Brief Narrative: Crystal Carlson is a 52 y.o. female pleasant with a history of stage IV pancreatic CA s/p biliary stenting and s/p 5 cycles of FOLFIRINOX who presented to the oncology clinic 1/14 with fever. She complained of chronic, unchanged abdominal pain and that her children had flu-like illness, but denied any N/V/D. She appeared dehydrated and febrile. While at the clinic lab work was obtained; chest x-ray showed RLL infiltrates (08/31/17). She was given 2 g of cefepime and admitted to a MedSurg bed. Vancomycin and empiric tamiflu added. Blood culture from the clinic grew GNR's.   Patient seen and examined with her mother at her bedside. Admits to generalized weakness and poor oral intake. Looks pale and dehydrated. No vomiting. Admits to watery stools last night. Hg 6.6, K+ 2.9. Will transfuse 2 U PRBCs and replete K+.  Assessment & Plan: Active Problems:   Elevated LFTs   Pancreatic cancer metastasized to liver (HCC)   Hypokalemia   Antineoplastic chemotherapy induced pancytopenia (HCC)   Fever of unknown origin   SIRS (systemic inflammatory response syndrome) (HCC)  GNR bacteremia with sepsis: Suspicion for intraabdominal process. Lactate reassuring.  - Changed to zosyn, ok to DC vancomycin.  - DC tamiflu - Monitor speciation from blood culture - Continue IVF's - HIDA scan 09/02/17 revealed: Normal hepatobiliary patency study.  Elevated LFT's:  - With history of biliary stenting and gram negative sepsis, though no definite obstruction. GI evaluation. Dr. Penelope Coop has requested HIDA scan in lieu of CT abd/pelvis.  -HIDA scan unremarkable - Trend CMP  Stage IV pancreatic CA s/p 5 cycle FOLFIRINOX (last 08/24/17)  - Appreciate Dr. Gearldine Shown involvement  Chemotherapy-induced anemia, thrombocytopenia: - Hgb 6.6 without active  bleeding, not hypotensive.  - transfuse 2U PRBCs  -CBC am  Hypokalemia -K+ 2.9 -replete as indicated -BMP am  DVT prophylaxis: SCDs Code Status: Full Family Communication: Family at bedside this AM Disposition Plan: Uncertain, pending further evaluation and treatment.   Consultants:   GI, Dr. Penelope Coop.   Procedures:   None  Antimicrobials:  Vancomycin 1/14  Cefepime 1/14 - 1/15  Zosyn 1/15 >>   Tamiflu 1/14 - 1/15  Objective: BP 123/86 (BP Location: Right Arm)   Pulse 80   Temp 98.2 F (36.8 C) (Oral)   Resp 20   SpO2 100%    Gen: Chronically ill-appearing, currently shivering 52 y.o. female in no distress  Pulm: Non-labored breathing room air. Clear to auscultation bilaterally.  CV: Regular rate and rhythm. No murmur, rub, or gallop. No JVD, no pedal edema. GI: Abdomen soft, not significantly tender, no rebound, no guarding, non-distended, with normoactive bowel sounds. No organomegaly or masses felt. Ext: Warm, no deformities Skin: No rashes or wounds. Neuro: Alert and oriented. No focal neurological deficits. Psych: Judgement and insight appear normal. Mood & affect appropriate.   Data Reviewed: I have personally reviewed following labs and imaging studies  CBC: Recent Labs  Lab 08/31/17 1350 09/01/17 0316 09/02/17 0403  WBC  --  4.2 4.4  NEUTROABS 3.8  --  3.0  HGB  --  7.0* 6.6*  HCT 27.5* 21.2* 20.0*  MCV 84.5 85.8 85.1  PLT  --  63* 61*   Basic Metabolic Panel: Recent Labs  Lab 08/31/17 1350 09/01/17 0316 09/02/17 0403  NA 138 139 136  K 3.1* 3.7 2.9*  CL 104 110 108  CO2 23  22 23  GLUCOSE 118 114* 105*  BUN 9 9 8   CREATININE  --  0.57 0.70  CALCIUM 8.8 7.9* 8.2*  MG  --   --  1.7   GFR: Estimated Creatinine Clearance: 70 mL/min (by C-G formula based on SCr of 0.7 mg/dL). Liver Function Tests: Recent Labs  Lab 08/31/17 1350 09/01/17 0316 09/02/17 0403  AST 42* 26 17  ALT 102* 62* 45  ALKPHOS 463* 319* 305*  BILITOT 1.1 0.9  0.8  PROT 7.1 5.7* 5.8*  ALBUMIN 2.9* 2.5* 2.3*   No results for input(s): LIPASE, AMYLASE in the last 168 hours. No results for input(s): AMMONIA in the last 168 hours. Coagulation Profile: No results for input(s): INR, PROTIME in the last 168 hours. Cardiac Enzymes: No results for input(s): CKTOTAL, CKMB, CKMBINDEX, TROPONINI in the last 168 hours. BNP (last 3 results) No results for input(s): PROBNP in the last 8760 hours. HbA1C: No results for input(s): HGBA1C in the last 72 hours. CBG: No results for input(s): GLUCAP in the last 168 hours. Lipid Profile: No results for input(s): CHOL, HDL, LDLCALC, TRIG, CHOLHDL, LDLDIRECT in the last 72 hours. Thyroid Function Tests: No results for input(s): TSH, T4TOTAL, FREET4, T3FREE, THYROIDAB in the last 72 hours. Anemia Panel: No results for input(s): VITAMINB12, FOLATE, FERRITIN, TIBC, IRON, RETICCTPCT in the last 72 hours. Urine analysis:    Component Value Date/Time   COLORURINE YELLOW 06/21/2017 0228   APPEARANCEUR CLEAR 06/21/2017 0228   LABSPEC 1.020 07/24/2017 0847   PHURINE 6.5 07/24/2017 0847   PHURINE 5.0 06/21/2017 0228   GLUCOSEU Negative 07/24/2017 0847   HGBUR Moderate 07/24/2017 0847   HGBUR MODERATE (A) 06/21/2017 0228   BILIRUBINUR Negative 07/24/2017 0847   KETONESUR Negative 07/24/2017 0847   KETONESUR NEGATIVE 06/21/2017 0228   PROTEINUR 30 07/24/2017 0847   PROTEINUR NEGATIVE 06/21/2017 0228   UROBILINOGEN 0.2 07/24/2017 0847   NITRITE Negative 07/24/2017 0847   NITRITE NEGATIVE 06/21/2017 0228   LEUKOCYTESUR Negative 07/24/2017 0847   Recent Results (from the past 240 hour(s))  Culture, Blood     Status: None (Preliminary result)   Collection Time: 08/31/17  2:00 PM  Result Value Ref Range Status   Specimen Description   Final    BLOOD LEFT ARM Performed at [facility], [place] [address], [place], [place] [address]    Special Requests   Final    BOTTLES DRAWN AEROBIC AND  ANAEROBIC Blood Culture adequate volume   Culture  Setup Time   Final    GRAM NEGATIVE RODS IN BOTH AEROBIC AND ANAEROBIC BOTTLES Organism ID to follow CRITICAL RESULT CALLED TO, READ BACK BY AND VERIFIED WITH: Debbra Riding MT 9:45 09/01/17 ([email or web address]) Performed at [facility], [place] [address] [place], [place] [address]    Culture GRAM NEGATIVE RODS  Final   Report Status PENDING  Incomplete  Blood Culture ID Panel (Reflexed)     Status: Abnormal   Collection Time: 08/31/17  2:00 PM  Result Value Ref Range Status   Enterococcus species NOT DETECTED NOT DETECTED Final   Listeria monocytogenes NOT DETECTED NOT DETECTED Final   Staphylococcus species NOT DETECTED NOT DETECTED Final   Staphylococcus aureus NOT DETECTED NOT DETECTED Final   Streptococcus species NOT DETECTED NOT DETECTED Final   Streptococcus agalactiae NOT DETECTED NOT DETECTED Final   Streptococcus pneumoniae NOT DETECTED NOT DETECTED Final   Streptococcus pyogenes NOT DETECTED NOT DETECTED Final   Acinetobacter baumannii NOT DETECTED NOT  DETECTED Final   Enterobacteriaceae species DETECTED (A) NOT DETECTED Final    Comment: Enterobacteriaceae represent a large family of gram-negative bacteria, not a single organism. CRITICAL RESULT CALLED TO, READ BACK BY AND VERIFIED WITH: Debbra Riding MT 9:45 09/01/17 ([email or web address])    Enterobacter cloacae complex NOT DETECTED NOT DETECTED Final   Escherichia coli NOT DETECTED NOT DETECTED Final   Klebsiella oxytoca NOT DETECTED NOT DETECTED Final   Klebsiella pneumoniae DETECTED (A) NOT DETECTED Final    Comment: CRITICAL RESULT CALLED TO, READ BACK BY AND VERIFIED WITH: Debbra Riding MT 9:45 09/01/17 ([email or web address])    Proteus species NOT DETECTED NOT DETECTED Final   Serratia marcescens NOT DETECTED NOT DETECTED Final   Carbapenem resistance NOT DETECTED NOT DETECTED Final   Haemophilus influenzae NOT DETECTED NOT DETECTED Final   Neisseria meningitidis NOT DETECTED NOT DETECTED Final    Pseudomonas aeruginosa NOT DETECTED NOT DETECTED Final   Candida albicans NOT DETECTED NOT DETECTED Final   Candida glabrata NOT DETECTED NOT DETECTED Final   Candida krusei NOT DETECTED NOT DETECTED Final   Candida parapsilosis NOT DETECTED NOT DETECTED Final   Candida tropicalis NOT DETECTED NOT DETECTED Final  Respiratory Panel by PCR     Status: None   Collection Time: 08/31/17  8:20 PM  Result Value Ref Range Status   Adenovirus NOT DETECTED NOT DETECTED Final   Coronavirus 229E NOT DETECTED NOT DETECTED Final   Coronavirus HKU1 NOT DETECTED NOT DETECTED Final   Coronavirus NL63 NOT DETECTED NOT DETECTED Final   Coronavirus OC43 NOT DETECTED NOT DETECTED Final   Metapneumovirus NOT DETECTED NOT DETECTED Final   Rhinovirus / Enterovirus NOT DETECTED NOT DETECTED Final   Influenza A NOT DETECTED NOT DETECTED Final   Influenza B NOT DETECTED NOT DETECTED Final   Parainfluenza Virus 1 NOT DETECTED NOT DETECTED Final   Parainfluenza Virus 2 NOT DETECTED NOT DETECTED Final   Parainfluenza Virus 3 NOT DETECTED NOT DETECTED Final   Parainfluenza Virus 4 NOT DETECTED NOT DETECTED Final   Respiratory Syncytial Virus NOT DETECTED NOT DETECTED Final   Bordetella pertussis NOT DETECTED NOT DETECTED Final   Chlamydophila pneumoniae NOT DETECTED NOT DETECTED Final   Mycoplasma pneumoniae NOT DETECTED NOT DETECTED Final    Comment: Performed at [facility], [place]. [address] [place], [place] [address]  Culture, blood (x 2)     Status: None (Preliminary result)   Collection Time: 08/31/17  8:38 PM  Result Value Ref Range Status   Specimen Description BLOOD LEFT ANTECUBITAL  Final   Special Requests   Final    BOTTLES DRAWN AEROBIC AND ANAEROBIC Blood Culture adequate volume   Culture   Final    NO GROWTH < 12 HOURS Performed at [facility], [place] [address] [place], [place] [address]    Report Status PENDING  Incomplete  Culture, blood (x 2)     Status: None (Preliminary result)     Collection Time: 08/31/17  8:46 PM  Result Value Ref Range Status   Specimen Description BLOOD RIGHT ANTECUBITAL  Final   Special Requests   Final    BOTTLES DRAWN AEROBIC AND ANAEROBIC Blood Culture adequate volume   Culture   Final    NO GROWTH < 12 HOURS Performed at [facility], [place] [address], [place], [place] [address]    Report Status PENDING  Incomplete      Radiology Studies: Dg Chest 2 View  Result Date: 08/31/2017 CLINICAL DATA:  Metastatic pancreatic cancer. EXAM: CHEST  2 VIEW COMPARISON:  07/24/2017 FINDINGS: Heart size is normal. Mediastinal shadows are normal. Power port on the right has its tip in the SVC as seen previously. The lungs are clear. The vascularity is normal. No effusions. No bone abnormality. Right upper quadrant stent appears unchanged. IMPRESSION: No active cardiopulmonary disease. Power port tip in the SVC above the right atrium. Electronically Signed   By: Nelson Chimes M.D.   On: 08/31/2017 15:12    Scheduled Meds:  Continuous Infusions: . sodium chloride 100 mL/hr at 09/01/17 1916  . sodium chloride    . piperacillin-tazobactam (ZOSYN)  IV 3.375 g (09/02/17 0516)     LOS: 2 days   Time spent: 25 minutes.  Kayleen Memos, MD [facility] Pager [phone]  If 7PM-7AM, please contact night-coverage [email or web address] Password [email or web address] 09/02/2017, 8:40 AM

## 2017-09-02 NOTE — Progress Notes (Signed)
CRITICAL VALUE ALERT  Critical Value:  hgb 6.6  Date & Time Notied:  09/02/17 0430  Provider Notified: yes  Orders Received/Actions taken: [id number]

## 2017-09-02 NOTE — Progress Notes (Signed)
These preliminary result these preliminary results were noted.  Awaiting final report.

## 2017-09-02 NOTE — Progress Notes (Addendum)
IP PROGRESS NOTE  Subjective:   She feels better.  She reports 4 episodes of diarrhea yesterday.  Objective: Vital signs in last 24 hours: Blood pressure 125/81, pulse 82, temperature 99.6 F (37.6 C), temperature source Oral, resp. rate 18, SpO2 100 %.  Intake/Output from previous day: 01/15 0701 - 01/16 0700 In: 3150 [P.O.:600; I.V.:2400; IV Piggyback:150] Out: -   Physical Exam:  HEENT: No thrush Lungs: Clear anteriorly, no respiratory distress Cardiac: Regular rate and rhythm Abdomen: No hepatomegaly, mild tenderness in the mid lower abdomen Extremities: No leg edema   Portacath/PICC-without erythema  Lab Results: Recent Labs    09/01/17 0316 09/02/17 0403  WBC 4.2 4.4  HGB 7.0* 6.6*  HCT 21.2* 20.0*  PLT 63* 61*   ANC 3.0 BMET Recent Labs    09/01/17 0316 09/02/17 0403  NA 139 136  K 3.7 2.9*  CL 110 108  CO2 22 23  GLUCOSE 114* 105*  BUN 9 8  CREATININE 0.57 0.70  CALCIUM 7.9* 8.2*    Lab Results  Component Value Date   CEA1 1.3 05/25/2017    Studies/Results: Dg Chest 2 View  Result Date: 08/31/2017 CLINICAL DATA:  Metastatic pancreatic cancer. EXAM: CHEST  2 VIEW COMPARISON:  07/24/2017 FINDINGS: Heart size is normal. Mediastinal shadows are normal. Power port on the right has its tip in the SVC as seen previously. The lungs are clear. The vascularity is normal. No effusions. No bone abnormality. Right upper quadrant stent appears unchanged. IMPRESSION: No active cardiopulmonary disease. Power port tip in the SVC above the right atrium. Electronically Signed   By: Nelson Chimes M.D.   On: 08/31/2017 15:12   Nm Hepatobiliary Liver Func  Result Date: 09/02/2017 CLINICAL DATA:  Pancreatic cancer with common bile duct stent in situ. Elevated LFTs. EXAM: NUCLEAR MEDICINE HEPATOBILIARY IMAGING TECHNIQUE: Sequential images of the abdomen were obtained out to 60 minutes following intravenous administration of radiopharmaceutical. RADIOPHARMACEUTICALS:   5.1 mCi Tc-97m  Choletec IV COMPARISON:  CT scan from 07/24/2017 has been reviewed. FINDINGS: There is brisk uptake of radiotracer by the hepatic parenchyma. Blood pool activity is largely cleared by 15 minutes. Biliary activity is seen by 10 minutes and gut activity is visible by 10-15 minutes. No gallbladder filling in this patient with a common bile duct stent crossing the ampulla of Vater. IMPRESSION: Normal hepatobiliary patency study. Electronically Signed   By: Misty Stanley M.D.   On: 09/02/2017 10:05    Medications: I have reviewed the patient's current medications.  Assessment/Plan: 1. 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 Adenocarcinoma consistent with pancreatobiliary primary.  Cycle 1 FOLFIRINOX 06/18/2017  Cycle 2FOLFIRINOX 07/02/2017  Cycle 3 FOLFIRINOX 07/16/2017 (oxaliplatin dose reduced due to mild thrombocytopenia)  CT 07/24/2017-new splenomegaly and left hydronephrosis  Cycle 4 FOLFIRINOX 08/08/2017  Cycle 5 FOLFIRINOX 08/24/2017  2. Pain secondary to #1.Improved.  3. Weight losssecondary to #1.  4. Microcytic anemia  Transfused with 1 unit of packed red blood cells 07/25/2017  5. Thrombocytopenia secondary to chemotherapy  6. Port-A-Cath placement 06/04/2017  7. Elevated liver enzymes/bilirubin10/22/2018-likely early biliary obstruction secondary to pancreas cancer, status post ERCP-placement of a common bile duct  stent 06/09/2018  8.Postprandial abdominal pain. Protonix initiated 07/16/2017.  Trial of  Reglan initiated 08/24/2017.  9.Admission with fever 07/24/2017-no source for infection identified, resolved  10.Diarrheafollowing cycle 3 FOLFIRINOX-resolved  11.Left hydronephrosis noted on CT12/03/2017-etiology unclear, urology consulted-stent not recommended  12.  Admission 08/31/2017 with a high fever, blood culture positive for gram-negative rods-Klebsiella pneumoniae identified  13.  Anemia secondary to chemotherapy, chronic disease, and sepsis  Ms. Almario appears improved.  She was admitted with Klebsiella bacteremia.  She continues to have a low-grade fever.  Gastroenterology has ordered a HIDA scan today and this was a normal study. I will order a restaging CT for tomorrow.  Recommendations: 1.  Continue Zosyn, follow-up on sensitivity testing on the gram-negative rod 2.  Transfuse packed red blood cells as needed for symptomatic anemia 3.  Imodium for diarrhea 4.  CT abdomen/pelvis 09/04/2017 5.  Continue IV fluids, replete potassium  I appreciate the care from the medical service.  I will continue following her in the hospital.   LOS: 2 days   Betsy Coder, MD   09/02/2017, 11:47 AM

## 2017-09-03 ENCOUNTER — Inpatient Hospital Stay (HOSPITAL_COMMUNITY): Payer: Medicaid Other

## 2017-09-03 LAB — BPAM RBC
Blood Product Expiration Date: 201901312359
ISSUE DATE / TIME: 201901161001
Unit Type and Rh: 5100

## 2017-09-03 LAB — COMPREHENSIVE METABOLIC PANEL
ALT: 46 U/L (ref 14–54)
AST: 48 U/L — AB (ref 15–41)
Albumin: 2.4 g/dL — ABNORMAL LOW (ref 3.5–5.0)
Alkaline Phosphatase: 389 U/L — ABNORMAL HIGH (ref 38–126)
Anion gap: 7 (ref 5–15)
BILIRUBIN TOTAL: 0.7 mg/dL (ref 0.3–1.2)
BUN: 5 mg/dL — ABNORMAL LOW (ref 6–20)
CALCIUM: 8.4 mg/dL — AB (ref 8.9–10.3)
CO2: 22 mmol/L (ref 22–32)
CREATININE: 0.61 mg/dL (ref 0.44–1.00)
Chloride: 111 mmol/L (ref 101–111)
GFR calc Af Amer: >60 mL/min (ref >60)
Glucose, Bld: 102 mg/dL — ABNORMAL HIGH (ref 65–99)
Potassium: 3.3 mmol/L — ABNORMAL LOW (ref 3.5–5.1)
Sodium: 140 mmol/L (ref 135–145)
TOTAL PROTEIN: 6 g/dL — AB (ref 6.5–8.1)

## 2017-09-03 LAB — CBC
HEMATOCRIT: 31.3 % — AB (ref 36.0–46.0)
Hemoglobin: 10.2 g/dL — ABNORMAL LOW (ref 12.0–15.0)
MCH: 27.1 pg (ref 26.0–34.0)
MCHC: 32.6 g/dL (ref 30.0–36.0)
MCV: 83.2 fL (ref 78.0–100.0)
Platelets: 65 10*3/uL — ABNORMAL LOW (ref 150–400)
RBC: 3.76 MIL/uL — AB (ref 3.87–5.11)
RDW: 21.8 % — AB (ref 11.5–15.5)
WBC: 6.5 10*3/uL (ref 4.0–10.5)

## 2017-09-03 LAB — CULTURE, BLOOD (SINGLE): Special Requests: ADEQUATE

## 2017-09-03 LAB — TYPE AND SCREEN
ABO/RH(D): O POS
Antibody Screen: NEGATIVE
Unit division: 0

## 2017-09-03 LAB — PHOSPHORUS: Phosphorus: 1.7 mg/dL — ABNORMAL LOW (ref 2.5–4.6)

## 2017-09-03 LAB — MAGNESIUM: MAGNESIUM: 1.6 mg/dL — AB (ref 1.7–2.4)

## 2017-09-03 MED ORDER — POTASSIUM PHOSPHATES 15 MMOLE/5ML IV SOLN
30.0000 mmol | Freq: Once | INTRAVENOUS | Status: AC
  Administered 2017-09-03: 30 mmol via INTRAVENOUS
  Filled 2017-09-03: qty 10

## 2017-09-03 MED ORDER — AMOXICILLIN-POT CLAVULANATE 875-125 MG PO TABS
1.0000 | ORAL_TABLET | Freq: Two times a day (BID) | ORAL | Status: DC
  Administered 2017-09-04: 1 via ORAL
  Filled 2017-09-03: qty 1

## 2017-09-03 MED ORDER — IOPAMIDOL (ISOVUE-300) INJECTION 61%
INTRAVENOUS | Status: AC
  Filled 2017-09-03: qty 30

## 2017-09-03 MED ORDER — MAGNESIUM SULFATE 2 GM/50ML IV SOLN
2.0000 g | Freq: Once | INTRAVENOUS | Status: DC
  Filled 2017-09-03: qty 50

## 2017-09-03 MED ORDER — IOPAMIDOL (ISOVUE-300) INJECTION 61%
30.0000 mL | Freq: Once | INTRAVENOUS | Status: AC
  Administered 2017-09-03: 30 mL via ORAL

## 2017-09-03 MED ORDER — IOPAMIDOL (ISOVUE-300) INJECTION 61%
INTRAVENOUS | Status: AC
  Filled 2017-09-03: qty 100

## 2017-09-03 MED ORDER — IOPAMIDOL (ISOVUE-300) INJECTION 61%
100.0000 mL | Freq: Once | INTRAVENOUS | Status: AC | PRN
  Administered 2017-09-03: 100 mL via INTRAVENOUS

## 2017-09-03 MED ORDER — MAGNESIUM SULFATE 2 GM/50ML IV SOLN
2.0000 g | Freq: Once | INTRAVENOUS | Status: AC
  Administered 2017-09-03: 2 g via INTRAVENOUS
  Filled 2017-09-03: qty 50

## 2017-09-03 NOTE — Progress Notes (Signed)
IP PROGRESS NOTE  Subjective:   She reports no diarrhea throughout the day yesterday.  She had an episode of diarrhea today.  She continues to have postprandial abdominal pain.  Objective: Vital signs in last 24 hours: Blood pressure 129/90, pulse 94, temperature 99.3 F (37.4 C), temperature source Oral, resp. rate 20, SpO2 99 %.  Intake/Output from previous day: 01/16 0701 - 01/17 0700 In: 2994 [P.O.:120; I.V.:2410; Blood:314; IV Piggyback:150] Out: -   Physical Exam:  HEENT: No thrush  Abdomen: No hepatomegaly, mild tenderness in the mid lower abdomen Extremities: No leg edema   Portacath/PICC-without erythema  Lab Results: Recent Labs    09/02/17 0403 09/03/17 0627  WBC 4.4 6.5  HGB 6.6* 10.2*  HCT 20.0* 31.3*  PLT 61* 65*   ANC 3.0 BMET Recent Labs    09/02/17 0403 09/03/17 0627  NA 136 140  K 2.9* 3.3*  CL 108 111  CO2 23 22  GLUCOSE 105* 102*  BUN 8 5*  CREATININE 0.70 0.61  CALCIUM 8.2* 8.4*    Lab Results  Component Value Date   CEA1 1.3 05/25/2017    Studies/Results: Nm Hepatobiliary Liver Func  Result Date: 09/02/2017 CLINICAL DATA:  Pancreatic cancer with common bile duct stent in situ. Elevated LFTs. EXAM: NUCLEAR MEDICINE HEPATOBILIARY IMAGING TECHNIQUE: Sequential images of the abdomen were obtained out to 60 minutes following intravenous administration of radiopharmaceutical. RADIOPHARMACEUTICALS:  5.1 mCi Tc-60m  Choletec IV COMPARISON:  CT scan from 07/24/2017 has been reviewed. FINDINGS: There is brisk uptake of radiotracer by the hepatic parenchyma. Blood pool activity is largely cleared by 15 minutes. Biliary activity is seen by 10 minutes and gut activity is visible by 10-15 minutes. No gallbladder filling in this patient with a common bile duct stent crossing the ampulla of Vater. IMPRESSION: Normal hepatobiliary patency study. Electronically Signed   By: Misty Stanley M.D.   On: 09/02/2017 10:05   Ct Abdomen Pelvis W  Contrast  Result Date: 09/03/2017 CLINICAL DATA:  Pancreatic carcinoma.  Biliary stenting.  Fever. EXAM: CT ABDOMEN AND PELVIS WITH CONTRAST TECHNIQUE: Multidetector CT imaging of the abdomen and pelvis was performed using the standard protocol following bolus administration of intravenous contrast. CONTRAST:  154mL ISOVUE-300 IOPAMIDOL (ISOVUE-300) INJECTION 61%, 39mL ISOVUE-300 IOPAMIDOL (ISOVUE-300) INJECTION 61% COMPARISON:  CT 07/24/2017 FINDINGS: Lower chest: Lung bases are clear. Hepatobiliary: Biliary stent in place. Mild duct dilatation in RIGHT hepatic lobe. Pneumobilia again noted. Low-density lesion in the central liver superior to stent measures 16 mm similar to 17 mm on prior remeasured (image 19, series 2). There is enhancing lesion the RIGHT hepatic lobe (segment 8) measuring 9 mm (image 23, series 2. Low-density lesion the posterior RIGHT hepatic lobe is unchanged. Pancreas: A biliary stent runs through the pancreatic head. The pancreatic mass is ill-defined. The pancreatic duct is mildly dilated. Body tail of pancreas is atrophic. These findings are not changed from prior. Spleen is normal. The splenic vein is patent portal veins are patent Spleen: Normal spleen Adrenals/urinary tract: Adrenal glands and kidneys are normal. The ureters and bladder normal. Stomach/Bowel: Stomach, duodenum small-bowel normal. There is edema distal small bowel and bowel and ascending colon. No bowel obstruction. Rectum normal Vascular/Lymphatic: The venous collaterals in porta hepatis suggesting venous cavernous venous transformation Reproductive: Uterus and ovaries normal Other: Small enhancing nodule in the RIGHT pericolic gutter lateral to the cecum measuring 20 mm (image 36, series 7 Musculoskeletal: No aggressive osseous lesion IMPRESSION: 1. Mild biliary dilatation RIGHT hepatic lobe. 2.  Pancreatic head mass is similar.  Wall stent in placed Small new enhancing lesion in the RIGHT hepatic lobe concerning for  metastatic lesion 3. Cavernous transformation porta hepatis. 4. Enhancing nodule along the RIGHT pericolic gutter suggests a peritoneal metastasis. 5. Mucosal thickening ascending colon suggests colitis. Cannot exclude typhlitis. Electronically Signed   By: Suzy Bouchard M.D.   On: 09/03/2017 16:03    Medications: I have reviewed the patient's current medications.  Assessment/Plan: 1. 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 Adenocarcinoma consistent with pancreatobiliary primary.  Cycle 1 FOLFIRINOX 06/18/2017  Cycle 2FOLFIRINOX 07/02/2017  Cycle 3 FOLFIRINOX 07/16/2017 (oxaliplatin dose reduced due to mild thrombocytopenia)  CT 07/24/2017-new splenomegaly and left hydronephrosis  Cycle 4 FOLFIRINOX 08/08/2017  Cycle 5 FOLFIRINOX 08/24/2017  CT abdomen/pelvis 09/03/2017-stable liver lesions and pancreas mass, stable right biliary dilatation, possible new 8 mm right liver lesion versus a vascular phenomena, lesion at the right paracolic gutter was present in the past-significance unclear  2. Pain secondary to #1.Improved.  3. Weight losssecondary to #1.  4. Microcytic anemia  Transfused with 1 unit of packed red blood cells 07/25/2017  5. Thrombocytopenia secondary to chemotherapy  6. Port-A-Cath placement 06/04/2017  7. Elevated liver enzymes/bilirubin10/22/2018-likely early biliary obstruction secondary to pancreas cancer, status post ERCP-placement of a common bile duct stent 06/09/2018  8.Postprandial abdominal pain.  Protonix initiated 07/16/2017.  Trial of Reglan initiated 08/24/2017.  9.Admission with fever 07/24/2017-no source for infection identified, resolved  10.Diarrheafollowing cycle 3 FOLFIRINOX-resolved  11.Left hydronephrosis noted on CT12/03/2017-etiology unclear, urology consulted-stent not recommended  12.  Admission 08/31/2017 with a high fever, blood culture positive for gram-negative rods-Klebsiella pneumoniae identified  13.  Anemia secondary to chemotherapy, chronic disease, and sepsis-status post transfusion with packed red blood cells 09/02/2017  Ms. Tull is recovering from admission with Klebsiella bacteremia.  She was transfused with packed red blood cells yesterday.  The restaging CT reveals overall stable disease.  I reviewed the CT images and discussed the reading with radiology.  The Klebsiella is sensitive to oral antibiotics.  The source for infection is unclear but may be related to chemotherapy-induced colitis or the biliary stent.  Hopefully she be able to go home soon if the diarrhea and abdominal pain improved.  Recommendations: 1.  Consider changing to oral antibiotic 2.   Imodium as needed for diarrhea 3.   IV fluids and replete potassium 4.  Follow-up as scheduled at the Cancer center.   I appreciate the care from the medical service.  I will continue following her in the hospital.   LOS: 3 days   Betsy Coder, MD   09/03/2017, 4:10 PM

## 2017-09-03 NOTE — Progress Notes (Signed)
These preliminary result these preliminary results were noted.  Awaiting final report.

## 2017-09-03 NOTE — Progress Notes (Signed)
[facility]  PROGRESS NOTE  Crystal Carlson BMW:[id number] DOB: February 25, 1966 DOA: 08/31/2017 PCP: Mack Hook, MD   Brief HPI:    52 y.o. female pleasant with a history of stage IV pancreatic CA s/p biliary stenting and s/p 5 cycles of FOLFIRINOX who presented to the oncology clinic 1/14 with fever. She complained of chronic, unchanged abdominal pain and that her children had flu-like illness, but denied any N/V/D. She appeared dehydrated and febrile. While at the clinic lab work was obtained; chest x-ray showed RLL infiltrates (08/31/17). She was given 2 g of cefepime and admitted to a MedSurg bed. Vancomycin and empiric tamiflu added. Blood culture from the clinic grew GNR's.    Subjective   Patient seen and examined, denies abdominal pain. No nausea or vomiting. Patient had watery stools this morning. Hemoglobin is 10.2 this morning   Assessment/Plan:     1. Klebsiella bacteremia- patient was started on IV Zosyn, vancomycin was discontinued. Source of infection likely intra-abdominal process. Lactic acid normal. Continue IV fluids. Will change antibiotics to Augmentin from tomorrow morning. 2. Elevated LFTs-patient has history of building standing and gram-negative sepsis. GI, Dr. Penelope Coop recommended Hida in place of CT scanscan , HIDA scan showed normal hepatobiliary patency study. 3. Hypokalemia-replace potassium and check BMP in am. 4. Hypomagnesemia/ hypophosphatemia-replace magnesium and phosphorous. 5. Stage IV pancreatic carcinoma-status post five cycles of FOLFIRINOX (last 08/24/17). Oncology Dr. Benay Spice following. 6. Chemotherapy -induced anemia/thrombocytopenia-hemoglobin is 6.6, without active bleeding. Status post three units P RBC and today hemoglobin is 10.2.     DVT prophylaxis: SCD  Code Status: full code  Family Communication: discussed with family member at bedside  Disposition Plan: likely home in  a.m.   Consultants:  oncology  Procedures:  none  Continuous infusions . sodium chloride 100 mL/hr at 09/03/17 0540  . sodium chloride    . magnesium sulfate 1 - 4 g bolus IVPB    . piperacillin-tazobactam (ZOSYN)  IV 3.375 g (09/03/17 1440)  . potassium PHOSPHATE IVPB (mmol) 30 mmol (09/03/17 1442)      Antibiotics:   Anti-infectives (From admission, onward)   Start     Dose/Rate Route Frequency Ordered Stop   09/04/17 1000  amoxicillin-clavulanate (AUGMENTIN) 875-125 MG per tablet 1 tablet     1 tablet Oral Every 12 hours 09/03/17 0935     09/01/17 2200  vancomycin (VANCOCIN) IVPB 1000 mg/200 mL premix  Status:  Discontinued     1,000 mg 200 mL/hr over 60 Minutes Intravenous Every 24 hours 08/31/17 2055 09/01/17 1100   09/01/17 1400  piperacillin-tazobactam (ZOSYN) IVPB 3.375 g     3.375 g 12.5 mL/hr over 240 Minutes Intravenous Every 8 hours 09/01/17 1100 09/04/17 0000   08/31/17 2359  ceFEPIme (MAXIPIME) 1 g in dextrose 5 % 50 mL IVPB  Status:  Discontinued     1 g 100 mL/hr over 30 Minutes Intravenous Every 8 hours 08/31/17 2055 09/01/17 1100   08/31/17 2300  oseltamivir (TAMIFLU) capsule 75 mg  Status:  Discontinued     75 mg Oral 2 times daily 08/31/17 2252 09/01/17 1527   08/31/17 2030  vancomycin (VANCOCIN) IVPB 1000 mg/200 mL premix     1,000 mg 200 mL/hr over 60 Minutes Intravenous  Once 08/31/17 2025 09/01/17 0719       Objective   Vitals:   09/02/17 1351 09/02/17 2230 09/03/17 0601 09/03/17 1443  BP: 131/86 123/90 125/90 129/90  Pulse: 83 78 86 94  Resp: (!) 24 16 18  20  Temp: 99.4 F (37.4 C) 99.2 F (37.3 C) 98.6 F (37 C) 99.3 F (37.4 C)  TempSrc: Oral Oral Oral Oral  SpO2: 100% 98% 100% 99%    Intake/Output Summary (Last 24 hours) at 09/03/2017 1519 Last data filed at 09/03/2017 1100 Gross per 24 hour  Intake 1850 ml  Output -  Net 1850 ml   There were no vitals filed for this visit.   Physical Examination:   Physical  Exam: Eyes: No icterus, extraocular muscles intact  Mouth: Oral mucosa is moist, no lesions on palate,  Neck: Supple, no deformities, masses, or tenderness Lungs: Normal respiratory effort, bilateral clear to auscultation, no crackles or wheezes.  Heart: Regular rate and rhythm, S1 and S2 normal, no murmurs, rubs auscultated Abdomen: BS normoactive,soft,nondistended,non-tender to palpation,no organomegaly Extremities: No pretibial edema, no erythema, no cyanosis, no clubbing Neuro : Alert and oriented to time, place and person, No focal deficits  Skin: No rashes seen on exam     Data Reviewed: I have personally reviewed following labs and imaging studies  CBG: No results for input(s): GLUCAP in the last 168 hours.  CBC: Recent Labs  Lab 08/31/17 1350 09/01/17 0316 09/02/17 0403 09/03/17 0627  WBC 4.5 4.2 4.4 6.5  NEUTROABS 3.8  --  3.0  --   HGB  --  7.0* 6.6* 10.2*  HCT 27.5* 21.2* 20.0* 31.3*  MCV 84.5 85.8 85.1 83.2  PLT 93* 63* 61* 65*    Basic Metabolic Panel: Recent Labs  Lab 08/31/17 1350 09/01/17 0316 09/02/17 0403 09/03/17 0627  NA 138 139 136 140  K 3.1* 3.7 2.9* 3.3*  CL 104 110 108 111  CO2 23 22 23 22   GLUCOSE 118 114* 105* 102*  BUN 9 9 8  5*  CREATININE  --  0.57 0.70 0.61  CALCIUM 8.8 7.9* 8.2* 8.4*  MG  --   --  1.7 1.6*  PHOS  --   --   --  1.7*    Recent Results (from the past 240 hour(s))  Culture, Blood     Status: Abnormal   Collection Time: 08/31/17  2:00 PM  Result Value Ref Range Status   Specimen Description   Final    BLOOD LEFT ARM Performed at [facility], [place] [address], [place], [place] [address]    Special Requests   Final    BOTTLES DRAWN AEROBIC AND ANAEROBIC Blood Culture adequate volume   Culture  Setup Time   Final    GRAM NEGATIVE RODS IN BOTH AEROBIC AND ANAEROBIC BOTTLES CRITICAL RESULT CALLED TO, READ BACK BY AND VERIFIED WITH: Debbra Riding MT 9:45 09/01/17 ([email or web address]) Performed at [facility], [place] [address], [place], [place] [address]    Culture KLEBSIELLA PNEUMONIAE (A)  Final   Report Status 09/03/2017 FINAL  Final   Organism ID, Bacteria KLEBSIELLA PNEUMONIAE  Final      Susceptibility   Klebsiella pneumoniae - MIC*    AMPICILLIN RESISTANT Resistant     CEFAZOLIN <=4 SENSITIVE Sensitive     CEFEPIME <=1 SENSITIVE Sensitive     CEFTAZIDIME <=1 SENSITIVE Sensitive     CEFTRIAXONE <=1 SENSITIVE Sensitive     CIPROFLOXACIN <=0.25 SENSITIVE Sensitive     GENTAMICIN <=1 SENSITIVE Sensitive     IMIPENEM <=0.25 SENSITIVE Sensitive     TRIMETH/SULFA <=20 SENSITIVE Sensitive     AMPICILLIN/SULBACTAM <=2 SENSITIVE Sensitive     PIP/TAZO <=4 SENSITIVE Sensitive     Extended ESBL  NEGATIVE Sensitive     * KLEBSIELLA PNEUMONIAE  Blood Culture ID Panel (Reflexed)     Status: Abnormal   Collection Time: 08/31/17  2:00 PM  Result Value Ref Range Status   Enterococcus species NOT DETECTED NOT DETECTED Final   Listeria monocytogenes NOT DETECTED NOT DETECTED Final   Staphylococcus species NOT DETECTED NOT DETECTED Final   Staphylococcus aureus NOT DETECTED NOT DETECTED Final   Streptococcus species NOT DETECTED NOT DETECTED Final   Streptococcus agalactiae NOT DETECTED NOT DETECTED Final   Streptococcus pneumoniae NOT DETECTED NOT DETECTED Final   Streptococcus pyogenes NOT DETECTED NOT DETECTED Final   Acinetobacter baumannii NOT DETECTED NOT DETECTED Final   Enterobacteriaceae species DETECTED (A) NOT DETECTED Final    Comment: Enterobacteriaceae represent a large family of gram-negative bacteria, not a single organism. CRITICAL RESULT CALLED TO, READ BACK BY AND VERIFIED WITH: Debbra Riding MT 9:45 09/01/17 ([email or web address])    Enterobacter cloacae complex NOT DETECTED NOT DETECTED Final   Escherichia coli NOT DETECTED NOT DETECTED Final   Klebsiella oxytoca NOT DETECTED NOT DETECTED Final   Klebsiella pneumoniae DETECTED (A) NOT DETECTED Final    Comment: CRITICAL RESULT  CALLED TO, READ BACK BY AND VERIFIED WITH: Debbra Riding MT 9:45 09/01/17 ([email or web address])    Proteus species NOT DETECTED NOT DETECTED Final   Serratia marcescens NOT DETECTED NOT DETECTED Final   Carbapenem resistance NOT DETECTED NOT DETECTED Final   Haemophilus influenzae NOT DETECTED NOT DETECTED Final   Neisseria meningitidis NOT DETECTED NOT DETECTED Final   Pseudomonas aeruginosa NOT DETECTED NOT DETECTED Final   Candida albicans NOT DETECTED NOT DETECTED Final   Candida glabrata NOT DETECTED NOT DETECTED Final   Candida krusei NOT DETECTED NOT DETECTED Final   Candida parapsilosis NOT DETECTED NOT DETECTED Final   Candida tropicalis NOT DETECTED NOT DETECTED Final  Respiratory Panel by PCR     Status: None   Collection Time: 08/31/17  8:20 PM  Result Value Ref Range Status   Adenovirus NOT DETECTED NOT DETECTED Final   Coronavirus 229E NOT DETECTED NOT DETECTED Final   Coronavirus HKU1 NOT DETECTED NOT DETECTED Final   Coronavirus NL63 NOT DETECTED NOT DETECTED Final   Coronavirus OC43 NOT DETECTED NOT DETECTED Final   Metapneumovirus NOT DETECTED NOT DETECTED Final   Rhinovirus / Enterovirus NOT DETECTED NOT DETECTED Final   Influenza A NOT DETECTED NOT DETECTED Final   Influenza B NOT DETECTED NOT DETECTED Final   Parainfluenza Virus 1 NOT DETECTED NOT DETECTED Final   Parainfluenza Virus 2 NOT DETECTED NOT DETECTED Final   Parainfluenza Virus 3 NOT DETECTED NOT DETECTED Final   Parainfluenza Virus 4 NOT DETECTED NOT DETECTED Final   Respiratory Syncytial Virus NOT DETECTED NOT DETECTED Final   Bordetella pertussis NOT DETECTED NOT DETECTED Final   Chlamydophila pneumoniae NOT DETECTED NOT DETECTED Final   Mycoplasma pneumoniae NOT DETECTED NOT DETECTED Final    Comment: Performed at [facility], [place]. [address] [place], [place] [address]  Culture, blood (x 2)     Status: None (Preliminary result)   Collection Time: 08/31/17  8:38 PM  Result Value Ref Range Status    Specimen Description BLOOD LEFT ANTECUBITAL  Final   Special Requests   Final    BOTTLES DRAWN AEROBIC AND ANAEROBIC Blood Culture adequate volume   Culture   Final    NO GROWTH 3 DAYS Performed at [facility], [place] [address], [place], [place] [address]  Report Status PENDING  Incomplete  Culture, blood (x 2)     Status: None (Preliminary result)   Collection Time: 08/31/17  8:46 PM  Result Value Ref Range Status   Specimen Description BLOOD RIGHT ANTECUBITAL  Final   Special Requests   Final    BOTTLES DRAWN AEROBIC AND ANAEROBIC Blood Culture adequate volume   Culture   Final    NO GROWTH 3 DAYS Performed at [facility], [address], [place], [place] [address]    Report Status PENDING  Incomplete     Liver Function Tests: Recent Labs  Lab 08/31/17 1350 09/01/17 0316 09/02/17 0403 09/03/17 0627  AST 42* 26 17 48*  ALT 102* 62* 45 46  ALKPHOS 463* 319* 305* 389*  BILITOT 1.1 0.9 0.8 0.7  PROT 7.1 5.7* 5.8* 6.0*  ALBUMIN 2.9* 2.5* 2.3* 2.4*   No results for input(s): LIPASE, AMYLASE in the last 168 hours. No results for input(s): AMMONIA in the last 168 hours.  Cardiac Enzymes: No results for input(s): CKTOTAL, CKMB, CKMBINDEX, TROPONINI in the last 168 hours. BNP (last 3 results) No results for input(s): BNP in the last 8760 hours.  ProBNP (last 3 results) No results for input(s): PROBNP in the last 8760 hours.    Studies: Nm Hepatobiliary Liver Func  Result Date: 09/02/2017 CLINICAL DATA:  Pancreatic cancer with common bile duct stent in situ. Elevated LFTs. EXAM: NUCLEAR MEDICINE HEPATOBILIARY IMAGING TECHNIQUE: Sequential images of the abdomen were obtained out to 60 minutes following intravenous administration of radiopharmaceutical. RADIOPHARMACEUTICALS:  5.1 mCi Tc-30m  Choletec IV COMPARISON:  CT scan from 07/24/2017 has been reviewed. FINDINGS: There is brisk uptake of radiotracer by the hepatic parenchyma. Blood pool activity is largely  cleared by 15 minutes. Biliary activity is seen by 10 minutes and gut activity is visible by 10-15 minutes. No gallbladder filling in this patient with a common bile duct stent crossing the ampulla of Vater. IMPRESSION: Normal hepatobiliary patency study. Electronically Signed   By: Misty Stanley M.D.   On: 09/02/2017 10:05    Scheduled Meds: . [START ON 09/04/2017] amoxicillin-clavulanate  1 tablet Oral Q12H  . iopamidol      . iopamidol          Time spent: 20 min  [facility] Pager [phone]. If 7PM-7AM, please contact night-coverage at [email or web address], Office  [phone]  password [email or web address]  09/03/2017, 3:19 PM  LOS: 3 days

## 2017-09-04 ENCOUNTER — Telehealth: Payer: Self-pay | Admitting: Oncology

## 2017-09-04 ENCOUNTER — Other Ambulatory Visit: Payer: Self-pay | Admitting: Nurse Practitioner

## 2017-09-04 DIAGNOSIS — C787 Secondary malignant neoplasm of liver and intrahepatic bile duct: Principal | ICD-10-CM

## 2017-09-04 DIAGNOSIS — C259 Malignant neoplasm of pancreas, unspecified: Secondary | ICD-10-CM

## 2017-09-04 LAB — MAGNESIUM: MAGNESIUM: 2.2 mg/dL (ref 1.7–2.4)

## 2017-09-04 LAB — BASIC METABOLIC PANEL
Anion gap: 6 (ref 5–15)
CALCIUM: 8.4 mg/dL — AB (ref 8.9–10.3)
CO2: 24 mmol/L (ref 22–32)
Chloride: 111 mmol/L (ref 101–111)
Creatinine, Ser: 0.63 mg/dL (ref 0.44–1.00)
GFR calc non Af Amer: >60 mL/min (ref >60)
Glucose, Bld: 113 mg/dL — ABNORMAL HIGH (ref 65–99)
Potassium: 3.3 mmol/L — ABNORMAL LOW (ref 3.5–5.1)
SODIUM: 141 mmol/L (ref 135–145)

## 2017-09-04 LAB — PHOSPHORUS: Phosphorus: 2.7 mg/dL (ref 2.5–4.6)

## 2017-09-04 MED ORDER — AMOXICILLIN-POT CLAVULANATE 875-125 MG PO TABS: 1.0000 | ORAL_TABLET | Freq: Two times a day (BID) | ORAL | 0 refills | Status: DC

## 2017-09-04 MED ORDER — POTASSIUM CHLORIDE CRYS ER 20 MEQ PO TBCR
40.0000 meq | EXTENDED_RELEASE_TABLET | Freq: Once | ORAL | Status: AC
  Administered 2017-09-04: 40 meq via ORAL

## 2017-09-04 MED ORDER — HEPARIN SOD (PORK) LOCK FLUSH 100 UNIT/ML IV SOLN
500.0000 [IU] | INTRAVENOUS | Status: AC | PRN
  Administered 2017-09-04: 500 [IU]

## 2017-09-04 MED FILL — AMOX TR-K CLV 875-125 MG TA: 875-125 | 10 days supply | Qty: 20 | Fill #0

## 2017-09-04 NOTE — Telephone Encounter (Signed)
Scheduled appt per 1/18 sch msg - spoke with patient's husband regarding appts.

## 2017-09-04 NOTE — Discharge Summary (Signed)
Physician Discharge Summary  Challis Crill IWP:[id number] DOB: 16-Dec-1965 DOA: 08/31/2017  PCP: Mack Hook, MD  Admit date: 08/31/2017 Discharge date: 09/04/2017  Time spent: 35 minutes  Recommendations for Outpatient Follow-up:  1. Follow up PCP in 2 weeks 2. Follow up Cancer center as scheduled   Discharge Diagnoses:  Active Problems:   Elevated LFTs   Pancreatic cancer metastasized to liver (HCC)   Hypokalemia   Antineoplastic chemotherapy induced pancytopenia (HCC)   Fever of unknown origin   SIRS (systemic inflammatory response syndrome) ([place])   Discharge Condition: Stable  Diet recommendation: Regular diet   History of present illness:  52 y.o.femalepleasantwith a history of stage IV pancreatic CA s/p biliary stenting and s/p 5 cycles of FOLFIRINOX who presented to the oncology clinic 1/14 with fever. She complained of chronic, unchanged abdominal pain and that her children had flu-like illness, but denied any N/V/D. She appeared dehydrated and febrile. While at the clinic lab work was obtained;chest x-ray showedRLL infiltrates (08/31/17). She was given 2 g of cefepime and admitted to a MedSurg bed. Vancomycin and empiric tamiflu added. Blood culture from the clinicgrewGNR's.     Hospital Course:   1. Klebsiella bacteremia- patient was started on IV Zosyn, vancomycin was discontinued. Source of infection likely intra-abdominal process. Lactic acid normal. Continue IV fluids. Will change antibiotics to Augmentin for 10 more days. 2. Elevated LFTs-patient has history of building standing and gram-negative sepsis. GI, Dr. Penelope Coop recommended Hida in place of CT scanscan , HIDA scan showed normal hepatobiliary patency study. 3. Hypokalemia- potassium is 3.3, will give one dose of kdur 40 meq po x 1 before discharge. 4. Hypomagnesemia/ hypophosphatemia-replaced magnesium and phosphorous. 5. Stage IV pancreatic carcinoma-status post five cycles of FOLFIRINOX (last  08/24/17). Oncology Dr. Benay Spice following. 6. Chemotherapy -induced anemia/thrombocytopenia-hemoglobin is 6.6, without active bleeding. Status post three units P RBC and today hemoglobin is 10.2.    Procedures:  None   Consultations:  Oncology   Discharge Exam: Vitals:   09/03/17 2152 09/04/17 0639  BP: 109/70 123/88  Pulse: 73 78  Resp: 20 18  Temp: 98 F (36.7 C) 98.3 F (36.8 C)  SpO2: 98% 100%    General: Appears in no acute distress Cardiovascular: S1S2 RRR Respiratory: Clear bilaterally  Discharge Instructions   Discharge Instructions    Diet - low sodium heart healthy   Complete by:  As directed    Increase activity slowly   Complete by:  As directed      Allergies as of 09/04/2017   No Known Allergies     Medication List    TAKE these medications   amoxicillin-clavulanate 875-125 MG tablet Commonly known as:  AUGMENTIN Take 1 tablet by mouth every 12 (twelve) hours.   cetirizine 10 MG tablet Commonly known as:  ZYRTEC Take 10 mg by mouth See admin instructions. Takes for 3 days follow chemotherapy   dexamethasone 4 MG tablet Commonly known as:  DECADRON TK 1 T PO BID FOR 3 DAYS AFTER EACH CYCLE OF CHEMO   ferrous sulfate 325 (65 FE) MG EC tablet Take 1 tablet (325 mg total) by mouth 3 (three) times daily with meals.   hydrocortisone 2.5 % rectal cream Commonly known as:  ANUSOL-HC Apply 1 application topically 4 (four) times daily as needed for hemorrhoids.   hydrocortisone cream 1 % Apply 1 application topically 3 (three) times daily as needed for itching (minor skin irritation).   HYDROmorphone 4 MG tablet Commonly known as:  DILAUDID Take 1-2  tablets (4-8 mg total) by mouth every 4 (four) hours as needed for severe pain.   lidocaine-prilocaine cream Commonly known as:  EMLA Apply to port site one hour prior to use. Do not rub in. Cover with plastic. What changed:    how much to take  how to take this  when to take  this  reasons to take this  additional instructions   loperamide 2 MG capsule Commonly known as:  IMODIUM Take 1 capsule (2 mg total) by mouth as needed for diarrhea or loose stools (max 8 tabs per day).   LORazepam 0.5 MG tablet Commonly known as:  ATIVAN Take 1 tablet (0.5 mg total) by mouth every 8 (eight) hours as needed for anxiety (or nausea).   metoCLOPramide 5 MG tablet Commonly known as:  REGLAN Take 1 tablet (5 mg total) by mouth 3 (three) times daily before meals.   morphine 15 MG 12 hr tablet Commonly known as:  MS CONTIN Take 1 tablet (15 mg total) by mouth every 12 (twelve) hours as needed for pain (breakthrough pain).   pantoprazole 40 MG tablet Commonly known as:  PROTONIX Take 1 tablet (40 mg total) by mouth daily.   polyethylene glycol packet Commonly known as:  MIRALAX / GLYCOLAX Take 17 g by mouth daily as needed for mild constipation or moderate constipation.   potassium chloride SA 20 MEQ tablet Commonly known as:  K-DUR,KLOR-CON Take 1 tablet (20 mEq total) by mouth daily.   prochlorperazine 10 MG tablet Commonly known as:  COMPAZINE Take 1 tablet (10 mg total) by mouth every 6 (six) hours as needed for nausea or vomiting.      No Known Allergies    The results of significant diagnostics from this hospitalization (including imaging, microbiology, ancillary and laboratory) are listed below for reference.    Significant Diagnostic Studies: Dg Chest 2 View  Result Date: 08/31/2017 CLINICAL DATA:  Metastatic pancreatic cancer. EXAM: CHEST  2 VIEW COMPARISON:  07/24/2017 FINDINGS: Heart size is normal. Mediastinal shadows are normal. Power port on the right has its tip in the SVC as seen previously. The lungs are clear. The vascularity is normal. No effusions. No bone abnormality. Right upper quadrant stent appears unchanged. IMPRESSION: No active cardiopulmonary disease. Power port tip in the SVC above the right atrium. Electronically Signed   By:  Nelson Chimes M.D.   On: 08/31/2017 15:12   Nm Hepatobiliary Liver Func  Result Date: 09/02/2017 CLINICAL DATA:  Pancreatic cancer with common bile duct stent in situ. Elevated LFTs. EXAM: NUCLEAR MEDICINE HEPATOBILIARY IMAGING TECHNIQUE: Sequential images of the abdomen were obtained out to 60 minutes following intravenous administration of radiopharmaceutical. RADIOPHARMACEUTICALS:  5.1 mCi Tc-79m  Choletec IV COMPARISON:  CT scan from 07/24/2017 has been reviewed. FINDINGS: There is brisk uptake of radiotracer by the hepatic parenchyma. Blood pool activity is largely cleared by 15 minutes. Biliary activity is seen by 10 minutes and gut activity is visible by 10-15 minutes. No gallbladder filling in this patient with a common bile duct stent crossing the ampulla of Vater. IMPRESSION: Normal hepatobiliary patency study. Electronically Signed   By: Misty Stanley M.D.   On: 09/02/2017 10:05   Ct Abdomen Pelvis W Contrast  Result Date: 09/03/2017 CLINICAL DATA:  Pancreatic carcinoma.  Biliary stenting.  Fever. EXAM: CT ABDOMEN AND PELVIS WITH CONTRAST TECHNIQUE: Multidetector CT imaging of the abdomen and pelvis was performed using the standard protocol following bolus administration of intravenous contrast. CONTRAST:  159mL ISOVUE-300 IOPAMIDOL (  ISOVUE-300) INJECTION 61%, 51mL ISOVUE-300 IOPAMIDOL (ISOVUE-300) INJECTION 61% COMPARISON:  CT 07/24/2017 FINDINGS: Lower chest: Lung bases are clear. Hepatobiliary: Biliary stent in place. Mild duct dilatation in RIGHT hepatic lobe. Pneumobilia again noted. Low-density lesion in the central liver superior to stent measures 16 mm similar to 17 mm on prior remeasured (image 19, series 2). There is enhancing lesion the RIGHT hepatic lobe (segment 8) measuring 9 mm (image 23, series 2. Low-density lesion the posterior RIGHT hepatic lobe is unchanged. Pancreas: A biliary stent runs through the pancreatic head. The pancreatic mass is ill-defined. The pancreatic duct is  mildly dilated. Body tail of pancreas is atrophic. These findings are not changed from prior. Spleen is normal. The splenic vein is patent portal veins are patent Spleen: Normal spleen Adrenals/urinary tract: Adrenal glands and kidneys are normal. The ureters and bladder normal. Stomach/Bowel: Stomach, duodenum small-bowel normal. There is edema distal small bowel and bowel and ascending colon. No bowel obstruction. Rectum normal Vascular/Lymphatic: The venous collaterals in porta hepatis suggesting venous cavernous venous transformation Reproductive: Uterus and ovaries normal Other: Small enhancing nodule in the RIGHT pericolic gutter lateral to the cecum measuring 20 mm (image 36, series 7 Musculoskeletal: No aggressive osseous lesion IMPRESSION: 1. Mild biliary dilatation RIGHT hepatic lobe. 2. Pancreatic head mass is similar.  Wall stent in placed Small new enhancing lesion in the RIGHT hepatic lobe concerning for metastatic lesion 3. Cavernous transformation porta hepatis. 4. Enhancing nodule along the RIGHT pericolic gutter suggests a peritoneal metastasis. 5. Mucosal thickening ascending colon suggests colitis. Cannot exclude typhlitis. Electronically Signed   By: Suzy Bouchard M.D.   On: 09/03/2017 16:03    Microbiology: Recent Results (from the past 240 hour(s))  Culture, Blood     Status: Abnormal   Collection Time: 08/31/17  2:00 PM  Result Value Ref Range Status   Specimen Description   Final    BLOOD LEFT ARM Performed at [facility], [address] [place], [place] [address]    Special Requests   Final    BOTTLES DRAWN AEROBIC AND ANAEROBIC Blood Culture adequate volume   Culture  Setup Time   Final    GRAM NEGATIVE RODS IN BOTH AEROBIC AND ANAEROBIC BOTTLES CRITICAL RESULT CALLED TO, READ BACK BY AND VERIFIED WITH: Debbra Riding MT 9:45 09/01/17 ([email or web address]) Performed at [facility], [place] [address], [place], [place] [address]    Culture KLEBSIELLA  PNEUMONIAE (A)  Final   Report Status 09/03/2017 FINAL  Final   Organism ID, Bacteria KLEBSIELLA PNEUMONIAE  Final      Susceptibility   Klebsiella pneumoniae - MIC*    AMPICILLIN RESISTANT Resistant     CEFAZOLIN <=4 SENSITIVE Sensitive     CEFEPIME <=1 SENSITIVE Sensitive     CEFTAZIDIME <=1 SENSITIVE Sensitive     CEFTRIAXONE <=1 SENSITIVE Sensitive     CIPROFLOXACIN <=0.25 SENSITIVE Sensitive     GENTAMICIN <=1 SENSITIVE Sensitive     IMIPENEM <=0.25 SENSITIVE Sensitive     TRIMETH/SULFA <=20 SENSITIVE Sensitive     AMPICILLIN/SULBACTAM <=2 SENSITIVE Sensitive     PIP/TAZO <=4 SENSITIVE Sensitive     Extended ESBL NEGATIVE Sensitive     * KLEBSIELLA PNEUMONIAE  Blood Culture ID Panel (Reflexed)     Status: Abnormal   Collection Time: 08/31/17  2:00 PM  Result Value Ref Range Status   Enterococcus species NOT DETECTED NOT DETECTED Final   Listeria monocytogenes NOT DETECTED NOT DETECTED Final   Staphylococcus  species NOT DETECTED NOT DETECTED Final   Staphylococcus aureus NOT DETECTED NOT DETECTED Final   Streptococcus species NOT DETECTED NOT DETECTED Final   Streptococcus agalactiae NOT DETECTED NOT DETECTED Final   Streptococcus pneumoniae NOT DETECTED NOT DETECTED Final   Streptococcus pyogenes NOT DETECTED NOT DETECTED Final   Acinetobacter baumannii NOT DETECTED NOT DETECTED Final   Enterobacteriaceae species DETECTED (A) NOT DETECTED Final    Comment: Enterobacteriaceae represent a large family of gram-negative bacteria, not a single organism. CRITICAL RESULT CALLED TO, READ BACK BY AND VERIFIED WITH: Debbra Riding MT 9:45 09/01/17 ([email or web address])    Enterobacter cloacae complex NOT DETECTED NOT DETECTED Final   Escherichia coli NOT DETECTED NOT DETECTED Final   Klebsiella oxytoca NOT DETECTED NOT DETECTED Final   Klebsiella pneumoniae DETECTED (A) NOT DETECTED Final    Comment: CRITICAL RESULT CALLED TO, READ BACK BY AND VERIFIED WITH: Debbra Riding MT 9:45 09/01/17 ([email or web address])     Proteus species NOT DETECTED NOT DETECTED Final   Serratia marcescens NOT DETECTED NOT DETECTED Final   Carbapenem resistance NOT DETECTED NOT DETECTED Final   Haemophilus influenzae NOT DETECTED NOT DETECTED Final   Neisseria meningitidis NOT DETECTED NOT DETECTED Final   Pseudomonas aeruginosa NOT DETECTED NOT DETECTED Final   Candida albicans NOT DETECTED NOT DETECTED Final   Candida glabrata NOT DETECTED NOT DETECTED Final   Candida krusei NOT DETECTED NOT DETECTED Final   Candida parapsilosis NOT DETECTED NOT DETECTED Final   Candida tropicalis NOT DETECTED NOT DETECTED Final  Respiratory Panel by PCR     Status: None   Collection Time: 08/31/17  8:20 PM  Result Value Ref Range Status   Adenovirus NOT DETECTED NOT DETECTED Final   Coronavirus 229E NOT DETECTED NOT DETECTED Final   Coronavirus HKU1 NOT DETECTED NOT DETECTED Final   Coronavirus NL63 NOT DETECTED NOT DETECTED Final   Coronavirus OC43 NOT DETECTED NOT DETECTED Final   Metapneumovirus NOT DETECTED NOT DETECTED Final   Rhinovirus / Enterovirus NOT DETECTED NOT DETECTED Final   Influenza A NOT DETECTED NOT DETECTED Final   Influenza B NOT DETECTED NOT DETECTED Final   Parainfluenza Virus 1 NOT DETECTED NOT DETECTED Final   Parainfluenza Virus 2 NOT DETECTED NOT DETECTED Final   Parainfluenza Virus 3 NOT DETECTED NOT DETECTED Final   Parainfluenza Virus 4 NOT DETECTED NOT DETECTED Final   Respiratory Syncytial Virus NOT DETECTED NOT DETECTED Final   Bordetella pertussis NOT DETECTED NOT DETECTED Final   Chlamydophila pneumoniae NOT DETECTED NOT DETECTED Final   Mycoplasma pneumoniae NOT DETECTED NOT DETECTED Final    Comment: Performed at [facility], [place]. [address], [place], [place] [address]  Culture, blood (x 2)     Status: None (Preliminary result)   Collection Time: 08/31/17  8:38 PM  Result Value Ref Range Status   Specimen Description BLOOD LEFT ANTECUBITAL  Final   Special Requests   Final    BOTTLES  DRAWN AEROBIC AND ANAEROBIC Blood Culture adequate volume   Culture   Final    NO GROWTH 4 DAYS Performed at [facility], [place] [address] [place], [place] [address]    Report Status PENDING  Incomplete  Culture, blood (x 2)     Status: None (Preliminary result)   Collection Time: 08/31/17  8:46 PM  Result Value Ref Range Status   Specimen Description BLOOD RIGHT ANTECUBITAL  Final   Special Requests   Final    BOTTLES DRAWN AEROBIC AND ANAEROBIC  Blood Culture adequate volume   Culture   Final    NO GROWTH 4 DAYS Performed at [place] [address] [place], [place] [address]    Report Status PENDING  Incomplete     Labs: Basic Metabolic Panel: Recent Labs  Lab 08/31/17 1350 09/01/17 0316 09/02/17 0403 09/03/17 0627 09/04/17 1024  NA 138 139 136 140 141  K 3.1* 3.7 2.9* 3.3* 3.3*  CL 104 110 108 111 111  CO2 23 22 23 22 24   GLUCOSE 118 114* 105* 102* 113*  BUN 9 9 8  5* <5*  CREATININE  --  0.57 0.70 0.61 0.63  CALCIUM 8.8 7.9* 8.2* 8.4* 8.4*  MG  --   --  1.7 1.6* 2.2  PHOS  --   --   --  1.7* 2.7   Liver Function Tests: Recent Labs  Lab 08/31/17 1350 09/01/17 0316 09/02/17 0403 09/03/17 0627  AST 42* 26 17 48*  ALT 102* 62* 45 46  ALKPHOS 463* 319* 305* 389*  BILITOT 1.1 0.9 0.8 0.7  PROT 7.1 5.7* 5.8* 6.0*  ALBUMIN 2.9* 2.5* 2.3* 2.4*   No results for input(s): LIPASE, AMYLASE in the last 168 hours. No results for input(s): AMMONIA in the last 168 hours. CBC: Recent Labs  Lab 08/31/17 1350 09/01/17 0316 09/02/17 0403 09/03/17 0627  WBC 4.5 4.2 4.4 6.5  NEUTROABS 3.8  --  3.0  --   HGB  --  7.0* 6.6* 10.2*  HCT 27.5* 21.2* 20.0* 31.3*  MCV 84.5 85.8 85.1 83.2  PLT 93* 63* 61* 65*       Signed:  Oswald Hillock MD.  [facility] 09/04/2017, 12:26 PM

## 2017-09-04 NOTE — Progress Notes (Signed)
Pt denies pain at the time of d/c with no s/s of distress noted. D/C teaching and instructions gone over with pt and spouse.

## 2017-09-05 LAB — CULTURE, BLOOD (ROUTINE X 2)
CULTURE: NO GROWTH
Culture: NO GROWTH
SPECIAL REQUESTS: ADEQUATE
Special Requests: ADEQUATE

## 2017-09-05 LAB — URINE CULTURE: CULTURE: NO GROWTH

## 2017-09-07 ENCOUNTER — Ambulatory Visit: Payer: Medicaid Other

## 2017-09-07 ENCOUNTER — Ambulatory Visit: Payer: Medicaid Other | Admitting: Oncology

## 2017-09-07 ENCOUNTER — Other Ambulatory Visit: Payer: Medicaid Other

## 2017-09-07 NOTE — Progress Notes (Signed)
These preliminary result these preliminary results were noted.  Awaiting final report.

## 2017-09-13 ENCOUNTER — Other Ambulatory Visit: Payer: Self-pay | Admitting: Oncology

## 2017-09-15 ENCOUNTER — Inpatient Hospital Stay: Payer: Medicaid Other

## 2017-09-15 ENCOUNTER — Telehealth: Payer: Self-pay | Admitting: Oncology

## 2017-09-15 ENCOUNTER — Inpatient Hospital Stay (HOSPITAL_BASED_OUTPATIENT_CLINIC_OR_DEPARTMENT_OTHER): Payer: Medicaid Other | Admitting: Oncology

## 2017-09-15 VITALS — BP 120/76 | HR 75 | Temp 97.2°F | Resp 18 | Ht 64.0 in | Wt 120.1 lb

## 2017-09-15 DIAGNOSIS — E876 Hypokalemia: Secondary | ICD-10-CM

## 2017-09-15 DIAGNOSIS — C787 Secondary malignant neoplasm of liver and intrahepatic bile duct: Principal | ICD-10-CM

## 2017-09-15 DIAGNOSIS — R7881 Bacteremia: Secondary | ICD-10-CM

## 2017-09-15 DIAGNOSIS — C259 Malignant neoplasm of pancreas, unspecified: Secondary | ICD-10-CM

## 2017-09-15 DIAGNOSIS — R197 Diarrhea, unspecified: Secondary | ICD-10-CM | POA: Diagnosis not present

## 2017-09-15 DIAGNOSIS — D6959 Other secondary thrombocytopenia: Secondary | ICD-10-CM

## 2017-09-15 DIAGNOSIS — C257 Malignant neoplasm of other parts of pancreas: Secondary | ICD-10-CM | POA: Diagnosis not present

## 2017-09-15 DIAGNOSIS — B961 Klebsiella pneumoniae [K. pneumoniae] as the cause of diseases classified elsewhere: Secondary | ICD-10-CM

## 2017-09-15 DIAGNOSIS — A419 Sepsis, unspecified organism: Secondary | ICD-10-CM | POA: Diagnosis not present

## 2017-09-15 DIAGNOSIS — Z5111 Encounter for antineoplastic chemotherapy: Secondary | ICD-10-CM | POA: Diagnosis not present

## 2017-09-15 DIAGNOSIS — G893 Neoplasm related pain (acute) (chronic): Secondary | ICD-10-CM | POA: Diagnosis not present

## 2017-09-15 DIAGNOSIS — R509 Fever, unspecified: Secondary | ICD-10-CM | POA: Diagnosis not present

## 2017-09-15 DIAGNOSIS — R52 Pain, unspecified: Secondary | ICD-10-CM | POA: Diagnosis not present

## 2017-09-15 DIAGNOSIS — Z5189 Encounter for other specified aftercare: Secondary | ICD-10-CM | POA: Diagnosis not present

## 2017-09-15 LAB — CMP (CANCER CENTER ONLY)
ALK PHOS: 472 U/L — AB (ref 40–150)
ALT: 32 U/L (ref 0–55)
AST: 42 U/L — ABNORMAL HIGH (ref 5–34)
Albumin: 2.8 g/dL — ABNORMAL LOW (ref 3.5–5.0)
Anion gap: 8 (ref 3–11)
BILIRUBIN TOTAL: 0.5 mg/dL (ref 0.2–1.2)
BUN: 6 mg/dL — ABNORMAL LOW (ref 7–26)
CALCIUM: 9.5 mg/dL (ref 8.4–10.4)
CO2: 25 mmol/L (ref 22–29)
Chloride: 108 mmol/L (ref 98–109)
Creatinine: 0.71 mg/dL (ref 0.60–1.10)
Glucose, Bld: 123 mg/dL (ref 70–140)
Potassium: 3.7 mmol/L (ref 3.3–4.7)
Sodium: 141 mmol/L (ref 136–145)
Total Protein: 6.7 g/dL (ref 6.4–8.3)

## 2017-09-15 LAB — CBC WITH DIFFERENTIAL (CANCER CENTER ONLY)
BASOS PCT: 1 %
Basophils Absolute: 0 10*3/uL (ref 0.0–0.1)
EOS ABS: 0 10*3/uL (ref 0.0–0.5)
EOS PCT: 1 %
HCT: 26.4 % — ABNORMAL LOW (ref 34.8–46.6)
Hemoglobin: 8.4 g/dL — ABNORMAL LOW (ref 11.6–15.9)
LYMPHS ABS: 1.1 10*3/uL (ref 0.9–3.3)
Lymphocytes Relative: 18 %
MCH: 27.8 pg (ref 25.1–34.0)
MCHC: 31.7 g/dL (ref 31.5–36.0)
MCV: 87.6 fL (ref 79.5–101.0)
MONOS PCT: 7 %
Monocytes Absolute: 0.4 10*3/uL (ref 0.1–0.9)
Neutro Abs: 4.3 10*3/uL (ref 1.5–6.5)
Neutrophils Relative %: 73 %
PLATELETS: 154 10*3/uL (ref 145–400)
RBC: 3.02 MIL/uL — AB (ref 3.70–5.45)
RDW: 22.2 % — ABNORMAL HIGH (ref 11.2–16.1)
WBC: 5.8 10*3/uL (ref 3.9–10.3)

## 2017-09-15 MED ORDER — DEXTROSE 5 % IV SOLN
Freq: Once | INTRAVENOUS | Status: AC
  Administered 2017-09-15: 10:00:00 via INTRAVENOUS

## 2017-09-15 MED ORDER — SODIUM CHLORIDE 0.9 % IV SOLN
Freq: Once | INTRAVENOUS | Status: AC
  Administered 2017-09-15: 10:00:00 via INTRAVENOUS
  Filled 2017-09-15: qty 5

## 2017-09-15 MED ORDER — SODIUM CHLORIDE 0.9 % IV SOLN
2400.0000 mg/m2 | INTRAVENOUS | Status: DC
  Administered 2017-09-15: 3750 mg via INTRAVENOUS
  Filled 2017-09-15: qty 75

## 2017-09-15 MED ORDER — POTASSIUM CHLORIDE CRYS ER 20 MEQ PO TBCR: 20.0000 meq | EXTENDED_RELEASE_TABLET | Freq: Every day | ORAL | 0 refills | Status: DC

## 2017-09-15 MED ORDER — DEXAMETHASONE 4 MG PO TABS: ORAL_TABLET | ORAL | 1 refills | Status: DC

## 2017-09-15 MED ORDER — SODIUM CHLORIDE 0.9 % IV SOLN
125.0000 mg/m2 | Freq: Once | INTRAVENOUS | Status: AC
  Administered 2017-09-15: 200 mg via INTRAVENOUS
  Filled 2017-09-15: qty 10

## 2017-09-15 MED ORDER — DEXTROSE 5 % IV SOLN
65.0000 mg/m2 | Freq: Once | INTRAVENOUS | Status: AC
  Administered 2017-09-15: 100 mg via INTRAVENOUS
  Filled 2017-09-15: qty 20

## 2017-09-15 MED ORDER — ATROPINE SULFATE 1 MG/ML IJ SOLN
0.5000 mg | Freq: Once | INTRAMUSCULAR | Status: AC | PRN
  Administered 2017-09-15: 0.5 mg via INTRAVENOUS

## 2017-09-15 MED ORDER — ATROPINE SULFATE 1 MG/ML IJ SOLN
INTRAMUSCULAR | Status: AC
  Filled 2017-09-15: qty 1

## 2017-09-15 MED ORDER — HEPARIN SOD (PORK) LOCK FLUSH 100 UNIT/ML IV SOLN
500.0000 [IU] | Freq: Once | INTRAVENOUS | Status: DC | PRN
  Filled 2017-09-15: qty 5

## 2017-09-15 MED ORDER — SODIUM CHLORIDE 0.9% FLUSH
10.0000 mL | INTRAVENOUS | Status: DC | PRN
  Filled 2017-09-15: qty 10

## 2017-09-15 MED ORDER — PALONOSETRON HCL INJECTION 0.25 MG/5ML
INTRAVENOUS | Status: AC
  Filled 2017-09-15: qty 5

## 2017-09-15 MED ORDER — SODIUM CHLORIDE 0.9 % IV SOLN
400.0000 mg/m2 | Freq: Once | INTRAVENOUS | Status: AC
  Administered 2017-09-15: 624 mg via INTRAVENOUS
  Filled 2017-09-15: qty 31.2

## 2017-09-15 MED ORDER — PALONOSETRON HCL INJECTION 0.25 MG/5ML
0.2500 mg | Freq: Once | INTRAVENOUS | Status: AC
  Administered 2017-09-15: 0.25 mg via INTRAVENOUS

## 2017-09-15 MED FILL — DEXAMETHASONE 4 MG TABLET: 4 | 3 days supply | Qty: 6 | Fill #0

## 2017-09-15 MED FILL — POTASSIUM CL ER 20 MEQ TABL: 20 | 30 days supply | Qty: 30 | Fill #0

## 2017-09-15 NOTE — Progress Notes (Signed)
[facility] OFFICE PROGRESS NOTE   Diagnosis: Pancreas cancer  INTERVAL HISTORY:   Crystal Carlson returns as scheduled.  She was discharged 09/04/2017 after admission with Klebsiella bacteremia.  She completed an outpatient course of Augmentin.  She no longer has diarrhea.  Feels well.  Good appetite.  No peripheral numbness.  She has cold sensitivity.  She continues to have low abdominal pain after eating.  This is relieved with Dilaudid.  Objective:  Vital signs in last 24 hours:  Blood pressure 120/76, pulse 75, temperature (!) 97.2 F (36.2 C), temperature source Oral, resp. rate 18, height 5\' 4"  (1.626 m), weight 120 lb 1.6 oz (54.5 kg), SpO2 100 %.    HEENT: No thrush or ulcers, mild white coat over the tongue Resp: Lungs clear bilaterally Cardio: Regular rate and rhythm GI: No hepatomegaly, no mass, nontender Vascular: No leg edema Neuro: Moderate loss of vibratory sense at the fingertips bilaterally    Portacath/PICC-without erythema  Lab Results:  Lab Results  Component Value Date   WBC 6.5 09/03/2017   HGB 10.2 (L) 09/03/2017   HCT 31.3 (L) 09/03/2017   MCV 83.2 09/03/2017   PLT 65 (L) 09/03/2017   NEUTROABS 3.0 09/02/2017    CMP     Component Value Date/Time   NA 141 09/04/2017 1024   NA 141 08/07/2017 1205   K 3.3 (L) 09/04/2017 1024   K 4.2 08/07/2017 1205   CL 111 09/04/2017 1024   CO2 24 09/04/2017 1024   CO2 25 08/07/2017 1205   GLUCOSE 113 (H) 09/04/2017 1024   GLUCOSE 114 08/07/2017 1205   BUN <5 (L) 09/04/2017 1024   BUN 9.6 08/07/2017 1205   CREATININE 0.63 09/04/2017 1024   CREATININE 0.8 08/07/2017 1205   CALCIUM 8.4 (L) 09/04/2017 1024   CALCIUM 9.1 08/07/2017 1205   PROT 6.0 (L) 09/03/2017 0627   PROT 6.7 08/07/2017 1205   ALBUMIN 2.4 (L) 09/03/2017 0627   ALBUMIN 3.0 (L) 08/07/2017 1205   AST 48 (H) 09/03/2017 0627   AST 42 (H) 08/31/2017 1350   AST 58 (H) 08/07/2017 1205   ALT 46 09/03/2017 0627   ALT 102 (H)  08/31/2017 1350   ALT 59 (H) 08/07/2017 1205   ALKPHOS 389 (H) 09/03/2017 0627   ALKPHOS 520 (H) 08/07/2017 1205   BILITOT 0.7 09/03/2017 0627   BILITOT 1.1 08/31/2017 1350   BILITOT 0.42 08/07/2017 1205   GFRNONAA >60 09/04/2017 1024   GFRNONAA >60 08/31/2017 1350   GFRAA >60 09/04/2017 1024   GFRAA >60 08/31/2017 1350    Medications: I have reviewed the patient's current medications.   Assessment/Plan: 1. 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 Adenocarcinoma consistent with pancreatobiliary primary.  Cycle 1 FOLFIRINOX 06/18/2017  Cycle 2FOLFIRINOX 07/02/2017  Cycle 3 FOLFIRINOX 07/16/2017 (oxaliplatin dose reduced due to mild thrombocytopenia)  CT 07/24/2017-new splenomegaly and left hydronephrosis  Cycle 4 FOLFIRINOX 08/08/2017  Cycle 5 FOLFIRINOX 08/24/2017  CT abdomen/pelvis 09/03/2017-stable liver lesions and pancreas mass, stable right biliary dilatation, possible new 8 mm right liver lesion versus a vascular phenomena, lesion at the right paracolic gutter was present in the past-significance unclear  2. Pain secondary to #1.Improved.  3. Weight losssecondary to #1.  4. Microcytic anemia  Transfused with 1 unit of packed red blood cells 07/25/2017  5. Thrombocytopenia secondary to chemotherapy  6. Port-A-Cath placement 06/04/2017  7. Elevated liver enzymes/bilirubin10/22/2018-likely early biliary obstruction secondary to pancreas cancer, status post ERCP-placement of a common bile duct stent  06/09/2018  8.Postprandial abdominal pain. Protonix initiated 07/16/2017.Trial of Reglan initiated 08/24/2017.  9.Admission with fever 07/24/2017-no source for infection identified, resolved  10.Diarrheafollowing cycle 3 FOLFIRINOX-resolved  11.Left hydronephrosis noted on CT12/03/2017-etiology unclear, urology consulted-stent not recommended  12.  Admission 08/31/2017 with a high fever, blood culture positive for gram-negative rods-Klebsiella pneumoniae identified  13.  Anemia secondary to chemotherapy, chronic disease, and sepsis-status post transfusion with packed red blood cells 09/02/2017   Disposition: Crystal Carlson appears well.  The plan is to proceed with another cycle of FOLFIRINOX today.  The irinotecan will be dose reduced secondary to diarrhea she experienced following the last cycle of chemotherapy.  Her overall performance status and pain remain much improved compared to prechemotherapy.  We will follow-up on the CA 19-9 from today.  I discussed plans to continue FOLFIRINOX for 8-9 cycles as long as she is tolerating the chemotherapy.  She will return for an office visit in the next cycle of chemotherapy in 3 weeks.  She will contact us in the interim as needed.  25 minutes were spent with the patient today.  The majority of the time used for counseling and coordination of care.  Betsy Coder, MD  09/15/2017  8:53 AM

## 2017-09-15 NOTE — Telephone Encounter (Signed)
Scheduled appt per 1/29 los - patient to get an updated schedule in the treatment area.

## 2017-09-15 NOTE — Progress Notes (Signed)
Nutrition Follow-up:  Patient with metastatic pancreatitic cancer, followed by Dr. Benay Spice. Noted recent hospital admission for anemia and positive blood cultures.  Met with patient and husband in infusion this am. Patient reports that her appetite has improved since last meeting with RD.  Reports no nausea and bowels are moving on regular basis at this time.  Reports that she has been trying to eat q 3 hours.  Likes fish, vegetables, fruit, milk, eggs, tortilla.  Patient is not currently drinking oral nutrition supplements.     Medications: reviewed  Labs: reviewed  Anthropometrics:   Weight has decreased to 120 lb 1.6 oz today from 123 lb 2 oz on 11/15.     NUTRITION DIAGNOSIS: Inadequate oral intake continues   INTERVENTION:   Discussed ways to increase calories and protein. Encouraged patient to continue to eat frequently during the day.   Discussed oral nutrition supplements to try (orgain)    MONITORING, EVALUATION, GOAL: Patient will work to increase calories and protein to minimize further weight loss.     NEXT VISIT: Feb 19th during infusion  Bertel Venard B. Zenia Resides, [place], [place] Registered Dietitian [phone] (pager)

## 2017-09-16 LAB — CANCER ANTIGEN 19-9: CA 19-9: 2749 U/mL — ABNORMAL HIGH (ref 0–35)

## 2017-09-17 ENCOUNTER — Inpatient Hospital Stay: Payer: Medicaid Other

## 2017-09-17 VITALS — BP 122/81 | HR 68 | Temp 98.3°F | Resp 16

## 2017-09-17 DIAGNOSIS — Z5111 Encounter for antineoplastic chemotherapy: Secondary | ICD-10-CM | POA: Diagnosis not present

## 2017-09-17 DIAGNOSIS — C259 Malignant neoplasm of pancreas, unspecified: Secondary | ICD-10-CM

## 2017-09-17 DIAGNOSIS — C787 Secondary malignant neoplasm of liver and intrahepatic bile duct: Principal | ICD-10-CM

## 2017-09-17 MED ORDER — PEGFILGRASTIM INJECTION 6 MG/0.6ML ~~LOC~~
PREFILLED_SYRINGE | SUBCUTANEOUS | Status: AC
  Filled 2017-09-17: qty 0.6

## 2017-09-17 MED ORDER — PEGFILGRASTIM INJECTION 6 MG/0.6ML ~~LOC~~
6.0000 mg | PREFILLED_SYRINGE | Freq: Once | SUBCUTANEOUS | Status: AC
  Administered 2017-09-17: 6 mg via SUBCUTANEOUS

## 2017-09-17 MED ORDER — SODIUM CHLORIDE 0.9% FLUSH
10.0000 mL | INTRAVENOUS | Status: DC | PRN
  Filled 2017-09-17: qty 10

## 2017-09-17 MED ORDER — HEPARIN SOD (PORK) LOCK FLUSH 100 UNIT/ML IV SOLN
500.0000 [IU] | Freq: Once | INTRAVENOUS | Status: DC | PRN
  Filled 2017-09-17: qty 5

## 2017-09-21 ENCOUNTER — Ambulatory Visit: Payer: Medicaid Other

## 2017-09-21 ENCOUNTER — Ambulatory Visit: Payer: Medicaid Other | Admitting: Oncology

## 2017-09-21 ENCOUNTER — Other Ambulatory Visit: Payer: Medicaid Other

## 2017-09-22 ENCOUNTER — Telehealth: Payer: Self-pay | Admitting: *Deleted

## 2017-09-22 ENCOUNTER — Inpatient Hospital Stay: Payer: Medicaid Other | Attending: Nurse Practitioner

## 2017-09-22 DIAGNOSIS — C787 Secondary malignant neoplasm of liver and intrahepatic bile duct: Secondary | ICD-10-CM | POA: Diagnosis not present

## 2017-09-22 DIAGNOSIS — D6481 Anemia due to antineoplastic chemotherapy: Secondary | ICD-10-CM | POA: Diagnosis not present

## 2017-09-22 DIAGNOSIS — C259 Malignant neoplasm of pancreas, unspecified: Secondary | ICD-10-CM | POA: Diagnosis not present

## 2017-09-22 DIAGNOSIS — G893 Neoplasm related pain (acute) (chronic): Secondary | ICD-10-CM | POA: Insufficient documentation

## 2017-09-22 DIAGNOSIS — Z5111 Encounter for antineoplastic chemotherapy: Secondary | ICD-10-CM | POA: Insufficient documentation

## 2017-09-22 DIAGNOSIS — D6959 Other secondary thrombocytopenia: Secondary | ICD-10-CM | POA: Diagnosis not present

## 2017-09-22 LAB — CBC WITH DIFFERENTIAL (CANCER CENTER ONLY)
Basophils Absolute: 0 10*3/uL (ref 0.0–0.1)
Basophils Relative: 0 %
Eosinophils Absolute: 0.1 10*3/uL (ref 0.0–0.5)
Eosinophils Relative: 0 %
HEMATOCRIT: 27.9 % — AB (ref 34.8–46.6)
Hemoglobin: 8.9 g/dL — ABNORMAL LOW (ref 11.6–15.9)
LYMPHS ABS: 0.9 10*3/uL (ref 0.9–3.3)
Lymphocytes Relative: 7 %
MCH: 28.1 pg (ref 25.1–34.0)
MCHC: 32 g/dL (ref 31.5–36.0)
MCV: 87.9 fL (ref 79.5–101.0)
MONOS PCT: 8 %
Monocytes Absolute: 1.2 10*3/uL — ABNORMAL HIGH (ref 0.1–0.9)
NEUTROS ABS: 12 10*3/uL — AB (ref 1.5–6.5)
NEUTROS PCT: 85 %
Platelet Count: 108 10*3/uL — ABNORMAL LOW (ref 145–400)
RBC: 3.17 MIL/uL — AB (ref 3.70–5.45)
RDW: 20.2 % — ABNORMAL HIGH (ref 11.2–14.5)
WBC: 14.2 10*3/uL — AB (ref 3.9–10.3)

## 2017-09-22 LAB — SAMPLE TO BLOOD BANK

## 2017-09-22 NOTE — Telephone Encounter (Signed)
Message from pt's husband reporting lethargy and vaginal spotting. Returned call, he reports pt has been in bed for past 2 days. Denies fever. Seems to be eating OK. Their main concern is weakness and fatigue.  Reviewed with [name], NP: Order received for lab today.  Husband aware, instructed him to wait for result.

## 2017-09-22 NOTE — Telephone Encounter (Signed)
CBC reviewed by Dr. Benay Spice. Symptoms likely related to chemo side effects, should resolve over next few days. Call for fever or worsening symptoms. Spoke with pt and husband in lobby, both voiced understanding.

## 2017-09-29 ENCOUNTER — Encounter: Payer: Self-pay | Admitting: Pharmacy Technician

## 2017-09-29 NOTE — Progress Notes (Signed)
Drug Assistance is closed for Emend and Neulasta. The patient has Medicare effective 07/18/17. Emend DOS 08/24/16 and Neulasta DOS 08/26/16 drug assistance was replaced and should not be billed to insurance. Subsequent DOS's should be billed to Medicaid.

## 2017-10-04 ENCOUNTER — Other Ambulatory Visit: Payer: Self-pay | Admitting: Oncology

## 2017-10-06 ENCOUNTER — Ambulatory Visit: Payer: Medicaid Other

## 2017-10-06 ENCOUNTER — Inpatient Hospital Stay: Payer: Medicaid Other

## 2017-10-06 ENCOUNTER — Telehealth: Payer: Self-pay | Admitting: Oncology

## 2017-10-06 ENCOUNTER — Encounter: Payer: Self-pay | Admitting: Nurse Practitioner

## 2017-10-06 ENCOUNTER — Inpatient Hospital Stay (HOSPITAL_BASED_OUTPATIENT_CLINIC_OR_DEPARTMENT_OTHER): Payer: Medicaid Other | Admitting: Nurse Practitioner

## 2017-10-06 VITALS — BP 134/91 | HR 85 | Temp 98.5°F | Resp 20 | Ht 64.0 in | Wt 118.8 lb

## 2017-10-06 DIAGNOSIS — C787 Secondary malignant neoplasm of liver and intrahepatic bile duct: Principal | ICD-10-CM

## 2017-10-06 DIAGNOSIS — D6959 Other secondary thrombocytopenia: Secondary | ICD-10-CM | POA: Diagnosis not present

## 2017-10-06 DIAGNOSIS — C259 Malignant neoplasm of pancreas, unspecified: Secondary | ICD-10-CM

## 2017-10-06 DIAGNOSIS — Z95828 Presence of other vascular implants and grafts: Secondary | ICD-10-CM

## 2017-10-06 DIAGNOSIS — Z5111 Encounter for antineoplastic chemotherapy: Secondary | ICD-10-CM | POA: Diagnosis not present

## 2017-10-06 DIAGNOSIS — G893 Neoplasm related pain (acute) (chronic): Secondary | ICD-10-CM | POA: Diagnosis not present

## 2017-10-06 DIAGNOSIS — D6481 Anemia due to antineoplastic chemotherapy: Secondary | ICD-10-CM

## 2017-10-06 LAB — CBC WITH DIFFERENTIAL (CANCER CENTER ONLY)
Basophils Absolute: 0 10*3/uL (ref 0.0–0.1)
Basophils Relative: 1 %
EOS ABS: 0 10*3/uL (ref 0.0–0.5)
EOS PCT: 1 %
HCT: 27.6 % — ABNORMAL LOW (ref 34.8–46.6)
Hemoglobin: 8.5 g/dL — ABNORMAL LOW (ref 11.6–15.9)
LYMPHS PCT: 10 %
Lymphs Abs: 0.7 10*3/uL — ABNORMAL LOW (ref 0.9–3.3)
MCH: 28.4 pg (ref 25.1–34.0)
MCHC: 30.8 g/dL — ABNORMAL LOW (ref 31.5–36.0)
MCV: 92.3 fL (ref 79.5–101.0)
MONO ABS: 0.6 10*3/uL (ref 0.1–0.9)
Monocytes Relative: 9 %
Neutro Abs: 5.1 10*3/uL (ref 1.5–6.5)
Neutrophils Relative %: 79 %
PLATELETS: 92 10*3/uL — AB (ref 145–400)
RBC: 2.99 MIL/uL — ABNORMAL LOW (ref 3.70–5.45)
RDW: 18.2 % — AB (ref 11.2–14.5)
WBC: 6.5 10*3/uL (ref 3.9–10.3)

## 2017-10-06 LAB — CMP (CANCER CENTER ONLY)
ALT: 39 U/L (ref 0–55)
AST: 30 U/L (ref 5–34)
Albumin: 3 g/dL — ABNORMAL LOW (ref 3.5–5.0)
Alkaline Phosphatase: 473 U/L — ABNORMAL HIGH (ref 40–150)
Anion gap: 9 (ref 3–11)
BUN: 6 mg/dL — ABNORMAL LOW (ref 7–26)
CHLORIDE: 106 mmol/L (ref 98–109)
CO2: 25 mmol/L (ref 22–29)
Calcium: 9.6 mg/dL (ref 8.4–10.4)
Creatinine: 0.68 mg/dL (ref 0.60–1.10)
Glucose, Bld: 113 mg/dL (ref 70–140)
POTASSIUM: 3.6 mmol/L (ref 3.5–5.1)
SODIUM: 140 mmol/L (ref 136–145)
Total Bilirubin: 0.7 mg/dL (ref 0.2–1.2)
Total Protein: 6.9 g/dL (ref 6.4–8.3)

## 2017-10-06 LAB — SAMPLE TO BLOOD BANK

## 2017-10-06 MED ORDER — ATROPINE SULFATE 1 MG/ML IJ SOLN
0.5000 mg | Freq: Once | INTRAMUSCULAR | Status: AC | PRN
  Administered 2017-10-06: 0.5 mg via INTRAVENOUS

## 2017-10-06 MED ORDER — DEXTROSE 5 % IV SOLN
Freq: Once | INTRAVENOUS | Status: AC
  Administered 2017-10-06: 10:00:00 via INTRAVENOUS

## 2017-10-06 MED ORDER — HYDROCODONE-ACETAMINOPHEN 5-325 MG PO TABS
ORAL_TABLET | ORAL | Status: AC
  Filled 2017-10-06: qty 1

## 2017-10-06 MED ORDER — SODIUM CHLORIDE 0.9% FLUSH
10.0000 mL | Freq: Once | INTRAVENOUS | Status: AC
  Administered 2017-10-06: 10 mL
  Filled 2017-10-06: qty 10

## 2017-10-06 MED ORDER — SODIUM CHLORIDE 0.9 % IV SOLN
2400.0000 mg/m2 | INTRAVENOUS | Status: DC
  Administered 2017-10-06: 3750 mg via INTRAVENOUS
  Filled 2017-10-06: qty 75

## 2017-10-06 MED ORDER — ATROPINE SULFATE 1 MG/ML IJ SOLN
INTRAMUSCULAR | Status: AC
  Filled 2017-10-06: qty 1

## 2017-10-06 MED ORDER — HYDROCODONE-ACETAMINOPHEN 5-325 MG PO TABS
1.0000 | ORAL_TABLET | Freq: Once | ORAL | Status: AC
  Administered 2017-10-06: 1 via ORAL

## 2017-10-06 MED ORDER — FLUCONAZOLE 100 MG PO TABS: 100.0000 mg | ORAL_TABLET | Freq: Every day | ORAL | 0 refills | Status: DC

## 2017-10-06 MED ORDER — PALONOSETRON HCL INJECTION 0.25 MG/5ML
INTRAVENOUS | Status: AC
  Filled 2017-10-06: qty 5

## 2017-10-06 MED ORDER — LEUCOVORIN CALCIUM INJECTION 350 MG
400.0000 mg/m2 | Freq: Once | INTRAMUSCULAR | Status: AC
  Administered 2017-10-06: 624 mg via INTRAVENOUS
  Filled 2017-10-06: qty 31.2

## 2017-10-06 MED ORDER — IRINOTECAN HCL CHEMO INJECTION 100 MG/5ML
100.0000 mg/m2 | Freq: Once | INTRAVENOUS | Status: AC
  Administered 2017-10-06: 160 mg via INTRAVENOUS
  Filled 2017-10-06: qty 8

## 2017-10-06 MED ORDER — DEXTROSE 5 % IV SOLN
50.0000 mg/m2 | Freq: Once | INTRAVENOUS | Status: AC
  Administered 2017-10-06: 80 mg via INTRAVENOUS
  Filled 2017-10-06: qty 16

## 2017-10-06 MED ORDER — SODIUM CHLORIDE 0.9% FLUSH
10.0000 mL | INTRAVENOUS | Status: DC | PRN
  Administered 2017-10-06: 10 mL
  Filled 2017-10-06: qty 10

## 2017-10-06 MED ORDER — FOSAPREPITANT DIMEGLUMINE INJECTION 150 MG
Freq: Once | INTRAVENOUS | Status: AC
  Administered 2017-10-06: 11:00:00 via INTRAVENOUS
  Filled 2017-10-06: qty 5

## 2017-10-06 MED ORDER — HYDROMORPHONE HCL 4 MG PO TABS: 4.0000 mg | ORAL_TABLET | ORAL | 0 refills | Status: DC | PRN

## 2017-10-06 MED ORDER — PALONOSETRON HCL INJECTION 0.25 MG/5ML
0.2500 mg | Freq: Once | INTRAVENOUS | Status: AC
Start: 2017-10-06 — End: 2017-10-06
  Administered 2017-10-06: 0.25 mg via INTRAVENOUS

## 2017-10-06 MED FILL — DEXAMETHASONE 4 MG TABLET: 4 | 3 days supply | Qty: 6 | Fill #1

## 2017-10-06 MED FILL — HYDROmorphone HCL 4 MG TABS: 4 | 2 days supply | Qty: 30 | Fill #0

## 2017-10-06 MED FILL — FLUCONAZOLE 100 MG TABLET: 100 | 4 days supply | Qty: 4 | Fill #0

## 2017-10-06 NOTE — Patient Instructions (Signed)
[facility] Discharge Instructions for Patients Receiving Chemotherapy  Today you received the following chemotherapy agents oxaliplatin (Eloxatin), irinotecan (Camptosar), leucovorin, and fluorouracil (Adrucil/5FU).  To help prevent nausea and vomiting after your treatment, we encourage you to take your nausea medication as directed by Dr. Benay Spice.   If you develop nausea and vomiting that is not controlled by your nausea medication, call the clinic.   BELOW ARE SYMPTOMS THAT SHOULD BE REPORTED IMMEDIATELY:  *FEVER GREATER THAN 100.5 F  *CHILLS WITH OR WITHOUT FEVER  NAUSEA AND VOMITING THAT IS NOT CONTROLLED WITH YOUR NAUSEA MEDICATION  *UNUSUAL SHORTNESS OF BREATH  *UNUSUAL BRUISING OR BLEEDING  TENDERNESS IN MOUTH AND THROAT WITH OR WITHOUT PRESENCE OF ULCERS  *URINARY PROBLEMS  *BOWEL PROBLEMS  UNUSUAL RASH Items with * indicate a potential emergency and should be followed up as soon as possible.  Feel free to call the clinic should you have any questions or concerns. The clinic phone number is [phone].  Please show the [facility] at check-in to the Emergency Department and triage nurse.

## 2017-10-06 NOTE — Progress Notes (Signed)
Dr.Sherrill okay to tx with plt 92. Dose reduction.  Scheduling message sent to change pump d/c appt time to 1400 on 10/08/17 instead of 1200.

## 2017-10-06 NOTE — Progress Notes (Signed)
Nutrition Follow-up:  Patient with metastatic pancreatic cancer, followed by Dr. Benay Spice.  Met with patient and husband in infusion this am.  Patient reports appetite is "Ok".  Reports no nausea but abdominal pain.  Reports that she is trying to eat every 2-3 hours.  Likes really spicy foods right now. Also likes tortilla with butter and sugar and either meat or beans. Patient is not drinking oral nutrition supplement at this time.  Reports she does not like ensure.    Reports the week of chemotherapy appetite is really good then it drops off.   No other nutrition impact symptoms reported. Noted mild oral candidasis today and diflucan given.   Medications: reviewed  Labs: reviewed  Anthropometrics:   Weight decreased from 120 lb 1.6 oz on 1/29 to 118 lb today.     NUTRITION DIAGNOSIS: Inadequate oral intake continues   INTERVENTION:   Recommend oral nutrition supplement 1-2 per day to provide additional calories and protein.  Samples of boost plus given today and discussed orgain shakes.  Coupons given.  Also discussed ways to flavor supplements.      MONITORING, EVALUATION, GOAL: Patient will work to increase calories and protein to minimize further weight loss   NEXT VISIT: March 12 during infusion  Mong Neal B. Zenia Resides, [place], [place] Registered Dietitian [phone] (pager)

## 2017-10-06 NOTE — Progress Notes (Addendum)
[facility] OFFICE PROGRESS NOTE   Diagnosis: Pancreas cancer  INTERVAL HISTORY:   Crystal Carlson returns as scheduled.  She completed cycle 6 FOLFIRINOX 09/15/2017.  She denies nausea/vomiting.  No diarrhea.  She noted gums were painful.  Tongue has a "funny feeling".  She notes an alteration in taste.  Arms and legs felt numb for about a week after the chemotherapy.  This has resolved.  Energy level is poor.  She takes Dilaudid intermittently for abdominal pain.  Objective:  Vital signs in last 24 hours:  Blood pressure (!) 134/91, pulse 85, temperature 98.5 F (36.9 C), temperature source Oral, resp. rate 20, height 5\' 4"  (1.626 m), weight 118 lb 12.8 oz (53.9 kg), SpO2 100 %.    HEENT: White coating over tongue. Resp: Lungs clear bilaterally. Cardio: Regular rate and rhythm. GI: Abdomen soft and nontender.  No hepatomegaly. Vascular: No leg edema.  Calves soft and nontender. Neuro: Vibratory sense intact over the fingertips per tuning fork exam. Skin: Palms without erythema. Port-A-Cath without erythema.   Lab Results:  Lab Results  Component Value Date   WBC 6.5 10/06/2017   HGB 10.2 (L) 09/03/2017   HCT 27.6 (L) 10/06/2017   MCV 92.3 10/06/2017   PLT 92 (L) 10/06/2017   NEUTROABS 5.1 10/06/2017    Imaging:  No results found.  Medications: I have reviewed the patient's current medications.  Assessment/Plan: 1. 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 Adenocarcinoma consistent with pancreatobiliary  primary.  Cycle 1 FOLFIRINOX 06/18/2017  Cycle 2FOLFIRINOX 07/02/2017  Cycle 3 FOLFIRINOX 07/16/2017 (oxaliplatin dose reduced due to mild thrombocytopenia)  CT 07/24/2017-new splenomegaly and left hydronephrosis  Cycle 4 FOLFIRINOX 08/08/2017  Cycle 5 FOLFIRINOX 08/24/2017  CT abdomen/pelvis 09/03/2017-stable liver lesions and pancreas mass, stable right biliary dilatation, possible new 8 mm right liver lesion versus a vascular phenomena, lesion at the right paracolic gutter was present in the past-significance unclear  Cycle 6 FOLFIRINOX 09/15/2017  Cycle 7 FOLFIRINOX 10/06/2017 (Irinotecan and Oxaliplatin dose reduced due to thrombocytopenia)  2. Pain secondary to #1.Improved.  3. Weight losssecondary to #1.  4. Microcytic anemia  Transfused with 1 unit of packed red blood cells 07/25/2017  5. Thrombocytopenia secondary to chemotherapy  6. Port-A-Cath placement 06/04/2017  7. Elevated liver enzymes/bilirubin10/22/2018-likely early biliary obstruction secondary to pancreas cancer, status post ERCP-placement of a common bile duct stent 06/09/2018  8.Postprandial abdominal pain. Protonix initiated 07/16/2017.Trial of Reglan initiated 08/24/2017.  9.Admission with fever 07/24/2017-no source for infection identified, resolved  10.Diarrheafollowing cycle 3 FOLFIRINOX-resolved  11.Left hydronephrosis noted on CT12/03/2017-etiology unclear, urology consulted-stent not recommended  12. Admission 08/31/2017 with a high fever, blood culture positive for gram-negative rods-Klebsiella pneumoniae identified  13. Anemia secondary to chemotherapy, chronic disease, and sepsis-status post transfusion with packed red blood cells 09/02/2017   Disposition: Crystal Carlson appears stable.  She has completed 6 cycles of FOLFIRINOX.  Plan to proceed with cycle 7 today as scheduled.  Oxaliplatin and irinotecan will be dose reduced due to thrombocytopenia.  She understands  to contact the office with any bleeding.  She has mild oral candidiasis.  She will complete a 4-day course of Diflucan to  see if this helps with the altered taste.  She will return for a follow-up visit in the next cycle of chemotherapy in 3 weeks.  She will contact the office in the interim as outlined above or with any other problems.  Patient seen with Dr. Benay Spice.  25 minutes were spent face-to-face at today's visit with the majority of that time involved in counseling/coordination of care.    [name] ANP/GNP-BC   10/06/2017  9:26 AM This was a shared visit with [name].  Crystal Carlson was interviewed and examined.  Her overall status appears unchanged.  Her performance status has improved significantly since beginning chemotherapy.  The chemotherapy will be dose reduced today secondary to thrombocytopenia.  Crystal Manson, MD

## 2017-10-06 NOTE — Telephone Encounter (Signed)
Patient scheduled AVS/Calendar printed per 2/19 LOS

## 2017-10-07 LAB — CANCER ANTIGEN 19-9: CAN 19-9: 2317 U/mL — AB (ref 0–35)

## 2017-10-08 ENCOUNTER — Inpatient Hospital Stay: Payer: Medicaid Other

## 2017-10-08 VITALS — BP 128/81 | HR 80 | Temp 98.0°F | Resp 18

## 2017-10-08 DIAGNOSIS — Z5111 Encounter for antineoplastic chemotherapy: Secondary | ICD-10-CM | POA: Diagnosis not present

## 2017-10-08 DIAGNOSIS — C259 Malignant neoplasm of pancreas, unspecified: Secondary | ICD-10-CM

## 2017-10-08 DIAGNOSIS — C787 Secondary malignant neoplasm of liver and intrahepatic bile duct: Principal | ICD-10-CM

## 2017-10-08 MED ORDER — PEGFILGRASTIM INJECTION 6 MG/0.6ML ~~LOC~~
6.0000 mg | PREFILLED_SYRINGE | Freq: Once | SUBCUTANEOUS | Status: AC
  Administered 2017-10-08: 6 mg via SUBCUTANEOUS

## 2017-10-08 MED ORDER — HEPARIN SOD (PORK) LOCK FLUSH 100 UNIT/ML IV SOLN
500.0000 [IU] | Freq: Once | INTRAVENOUS | Status: AC | PRN
  Administered 2017-10-08: 500 [IU]
  Filled 2017-10-08: qty 5

## 2017-10-08 MED ORDER — SODIUM CHLORIDE 0.9% FLUSH
10.0000 mL | INTRAVENOUS | Status: DC | PRN
  Administered 2017-10-08: 10 mL
  Filled 2017-10-08: qty 10

## 2017-10-08 MED ORDER — PEGFILGRASTIM INJECTION 6 MG/0.6ML ~~LOC~~
PREFILLED_SYRINGE | SUBCUTANEOUS | Status: AC
  Filled 2017-10-08: qty 0.6

## 2017-10-19 ENCOUNTER — Encounter: Payer: Self-pay | Admitting: *Deleted

## 2017-10-22 ENCOUNTER — Other Ambulatory Visit: Payer: Self-pay | Admitting: Oncology

## 2017-10-25 ENCOUNTER — Encounter (HOSPITAL_COMMUNITY): Payer: Self-pay

## 2017-10-25 ENCOUNTER — Emergency Department (HOSPITAL_COMMUNITY): Payer: Medicaid Other

## 2017-10-25 ENCOUNTER — Emergency Department (HOSPITAL_COMMUNITY)
Admission: EM | Admit: 2017-10-25 | Discharge: 2017-10-25 | Disposition: A | Discharge status: Home / Self Care | Payer: Medicaid Other | Attending: Emergency Medicine | Admitting: Emergency Medicine

## 2017-10-25 ENCOUNTER — Other Ambulatory Visit: Payer: Self-pay

## 2017-10-25 DIAGNOSIS — R509 Fever, unspecified: Secondary | ICD-10-CM | POA: Diagnosis present

## 2017-10-25 DIAGNOSIS — C259 Malignant neoplasm of pancreas, unspecified: Secondary | ICD-10-CM | POA: Diagnosis not present

## 2017-10-25 DIAGNOSIS — K529 Noninfective gastroenteritis and colitis, unspecified: Secondary | ICD-10-CM | POA: Insufficient documentation

## 2017-10-25 DIAGNOSIS — I10 Essential (primary) hypertension: Secondary | ICD-10-CM | POA: Insufficient documentation

## 2017-10-25 DIAGNOSIS — Z79899 Other long term (current) drug therapy: Secondary | ICD-10-CM | POA: Diagnosis not present

## 2017-10-25 LAB — URINALYSIS, ROUTINE W REFLEX MICROSCOPIC
Bilirubin Urine: NEGATIVE
GLUCOSE, UA: NEGATIVE mg/dL
Ketones, ur: 5 mg/dL — AB
LEUKOCYTES UA: NEGATIVE
NITRITE: NEGATIVE
PROTEIN: 30 mg/dL — AB
SPECIFIC GRAVITY, URINE: 1.019 (ref 1.005–1.030)
pH: 5 (ref 5.0–8.0)

## 2017-10-25 LAB — COMPREHENSIVE METABOLIC PANEL
ALBUMIN: 3.4 g/dL — AB (ref 3.5–5.0)
ALT: 54 U/L (ref 14–54)
ANION GAP: 9 (ref 5–15)
AST: 46 U/L — ABNORMAL HIGH (ref 15–41)
Alkaline Phosphatase: 487 U/L — ABNORMAL HIGH (ref 38–126)
BUN: 8 mg/dL (ref 6–20)
CO2: 25 mmol/L (ref 22–32)
Calcium: 9.4 mg/dL (ref 8.9–10.3)
Chloride: 104 mmol/L (ref 101–111)
Creatinine, Ser: 0.62 mg/dL (ref 0.44–1.00)
GLUCOSE: 137 mg/dL — AB (ref 65–99)
POTASSIUM: 3.6 mmol/L (ref 3.5–5.1)
Sodium: 138 mmol/L (ref 135–145)
TOTAL PROTEIN: 7.5 g/dL (ref 6.5–8.1)
Total Bilirubin: 1.1 mg/dL (ref 0.3–1.2)

## 2017-10-25 LAB — I-STAT CG4 LACTIC ACID, ED
LACTIC ACID, VENOUS: 0.93 mmol/L (ref 0.5–1.9)
Lactic Acid, Venous: 0.81 mmol/L (ref 0.5–1.9)

## 2017-10-25 LAB — CBC WITH DIFFERENTIAL/PLATELET
BASOS ABS: 0 10*3/uL (ref 0.0–0.1)
BASOS PCT: 0 %
EOS ABS: 0 10*3/uL (ref 0.0–0.7)
Eosinophils Relative: 0 %
HEMATOCRIT: 31.2 % — AB (ref 36.0–46.0)
HEMOGLOBIN: 9.8 g/dL — AB (ref 12.0–15.0)
Lymphocytes Relative: 7 %
Lymphs Abs: 0.5 10*3/uL — ABNORMAL LOW (ref 0.7–4.0)
MCH: 28.7 pg (ref 26.0–34.0)
MCHC: 31.4 g/dL (ref 30.0–36.0)
MCV: 91.5 fL (ref 78.0–100.0)
Monocytes Absolute: 0.9 10*3/uL (ref 0.1–1.0)
Monocytes Relative: 11 %
NEUTROS ABS: 6.7 10*3/uL (ref 1.7–7.7)
NEUTROS PCT: 82 %
Platelets: 87 10*3/uL — ABNORMAL LOW (ref 150–400)
RBC: 3.41 MIL/uL — ABNORMAL LOW (ref 3.87–5.11)
RDW: 17.8 % — ABNORMAL HIGH (ref 11.5–15.5)
WBC: 8.1 10*3/uL (ref 4.0–10.5)

## 2017-10-25 LAB — LIPASE, BLOOD: LIPASE: 19 U/L (ref 11–51)

## 2017-10-25 MED ORDER — PIPERACILLIN-TAZOBACTAM 3.375 G IVPB 30 MIN
3.3750 g | INTRAVENOUS | Status: AC
  Administered 2017-10-25: 3.375 g via INTRAVENOUS
  Filled 2017-10-25: qty 50

## 2017-10-25 MED ORDER — HEPARIN SOD (PORK) LOCK FLUSH 100 UNIT/ML IV SOLN
500.0000 [IU] | Freq: Once | INTRAVENOUS | Status: AC
  Administered 2017-10-25: 500 [IU]
  Filled 2017-10-25: qty 5

## 2017-10-25 MED ORDER — IOPAMIDOL (ISOVUE-300) INJECTION 61%
INTRAVENOUS | Status: AC
  Administered 2017-10-25: 100 mL
  Filled 2017-10-25: qty 100

## 2017-10-25 MED ORDER — ONDANSETRON 4 MG PO TBDP: 4.0000 mg | ORAL_TABLET | Freq: Three times a day (TID) | ORAL | 0 refills | Status: DC | PRN

## 2017-10-25 MED ORDER — SODIUM CHLORIDE 0.9 % IJ SOLN
INTRAMUSCULAR | Status: AC
  Administered 2017-10-25: 19:00:00
  Filled 2017-10-25: qty 50

## 2017-10-25 MED ORDER — VANCOMYCIN HCL IN DEXTROSE 1-5 GM/200ML-% IV SOLN
1000.0000 mg | INTRAVENOUS | Status: AC
  Administered 2017-10-25: 1000 mg via INTRAVENOUS
  Filled 2017-10-25: qty 200

## 2017-10-25 MED ORDER — IOPAMIDOL (ISOVUE-300) INJECTION 61%
INTRAVENOUS | Status: AC
  Administered 2017-10-25: 17:00:00
  Filled 2017-10-25: qty 30

## 2017-10-25 MED ORDER — CIPROFLOXACIN HCL 500 MG PO TABS: 500.0000 mg | ORAL_TABLET | Freq: Two times a day (BID) | ORAL | 0 refills | Status: AC

## 2017-10-25 MED ORDER — SODIUM CHLORIDE 0.9 % IV BOLUS (SEPSIS)
1000.0000 mL | Freq: Once | INTRAVENOUS | Status: AC
  Administered 2017-10-25: 1000 mL via INTRAVENOUS

## 2017-10-25 NOTE — ED Provider Notes (Signed)
[facility] DEPT Provider Note   CSN: [id number] Arrival date & time: 10/25/17  1435     History   Chief Complaint Chief Complaint  Patient presents with  . Fever  . Cancer    HPI Crystal Carlson is a 52 y.o. female with a past medical history of stage IV pancreatic cancer, who presents to ED for evaluation of fever with T-max 102 since yesterday.  She last received her chemotherapy 3 weeks ago and is due for 2 days.  She also complains of pain to the umbilical area for the past several weeks.  Tells me the area has gotten more red and painful gradually.  She has been taking antipyretics with improvement in her fever.  Denies any cough, congestion, chest pain, shortness of breath, diarrhea, dysuria, vomiting.  HPI  Past Medical History:  Diagnosis Date  . Cancer of pancreas ([place]) 05/26/2017  . Chronic pancreatitis ([place]) 05/19/2017  . Microcytic anemia 05/19/2017  . Prediabetes 2016   when living in [place]    Patient Active Problem List   Diagnosis Date Noted  . SIRS (systemic inflammatory response syndrome) ([place]) 09/01/2017  . Fever of unknown origin 08/31/2017  . Antineoplastic chemotherapy induced pancytopenia ([place]) 07/25/2017  . Protein-calorie malnutrition, moderate ([place]) 07/25/2017  . Dehydration 07/24/2017  . Hypokalemia 07/24/2017  . Neutropenic fever ([place]) 07/24/2017  . Port-A-Cath in place 07/02/2017  . Chronic pain due to malignant neoplastic disease 07/02/2017  . Encounter for antineoplastic chemotherapy 07/02/2017  . Goals of care, counseling/discussion 05/29/2017  . Pancreatic cancer metastasized to liver ([place]) 05/26/2017  . Abdominal pain 05/25/2017  . HTN (hypertension) 05/25/2017  . Ovarian cyst 05/25/2017  . Elevated LFTs 05/25/2017  . 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 Chronic pancreatitis ([place]) 05/19/2017  . Microcytic anemia 05/19/2017  . Prediabetes 08/18/2014    Past Surgical History:  Procedure Laterality Date  . ERCP  N/A 06/09/2017   Procedure: ENDOSCOPIC RETROGRADE CHOLANGIOPANCREATOGRAPHY (ERCP);  Surgeon: Clarene Essex, MD;  Location: [facility];  Service: Endoscopy;  Laterality: N/A;  . IR FLUORO GUIDE PORT INSERTION RIGHT  06/04/2017  . IR US GUIDE VASC ACCESS RIGHT  06/04/2017    OB History    No data available       Home Medications    Prior to Admission medications   Medication Sig Start Date End Date Taking? Authorizing Provider  amoxicillin-clavulanate (AUGMENTIN) 875-125 MG tablet Take 1 tablet by mouth every 12 (twelve) hours. 09/04/17   Oswald Hillock, MD  cetirizine (ZYRTEC) 10 MG tablet Take 10 mg by mouth See admin instructions. Takes for 3 days follow chemotherapy     [provider]  ciprofloxacin (CIPRO) 500 MG tablet Take 1 tablet (500 mg total) by mouth every 12 (twelve) hours for 7 days. 10/25/17 11/01/17  Coren Crownover, PA-C  dexamethasone (DECADRON) 4 MG tablet TK 1 T PO BID FOR 3 DAYS AFTER EACH CYCLE OF CHEMO 09/15/17   Ladell Pier, MD  ferrous sulfate 325 (65 FE) MG EC tablet Take 1 tablet (325 mg total) by mouth 3 (three) times daily with meals. 06/18/17   Ladell Pier, MD  fluconazole (DIFLUCAN) 100 MG tablet Take 1 tablet (100 mg total) by mouth daily. 10/06/17   Owens Shark, NP  hydrocortisone (ANUSOL-HC) 2.5 % rectal cream Apply 1 application topically 4 (four) times daily as needed for hemorrhoids. 07/27/17   Theodis Blaze, MD  hydrocortisone cream 1 % Apply 1 application topically 3 (three) times  daily as needed for itching (minor skin irritation). 07/27/17   Theodis Blaze, MD  HYDROmorphone (DILAUDID) 4 MG tablet Take 1-2 tablets (4-8 mg total) by mouth every 4 (four) hours as needed for severe pain. 10/06/17   Owens Shark, NP  lidocaine-prilocaine (EMLA) cream Apply to port site one hour prior to use. Do not rub in. Cover with plastic. Patient taking differently: Apply 1 application daily as needed topically. Apply to port site one hour prior to use.  Do not rub in. Cover with plastic. 06/03/17   Ladell Pier, MD  loperamide (IMODIUM) 2 MG capsule Take 1 capsule (2 mg total) by mouth as needed for diarrhea or loose stools (max 8 tabs per day). 08/12/17   Owens Shark, NP  LORazepam (ATIVAN) 0.5 MG tablet Take 1 tablet (0.5 mg total) by mouth every 8 (eight) hours as needed for anxiety (or nausea). 07/16/17   Owens Shark, NP  metoCLOPramide (REGLAN) 5 MG tablet Take 1 tablet (5 mg total) by mouth 3 (three) times daily before meals. 08/24/17   Owens Shark, NP  morphine (MS CONTIN) 15 MG 12 hr tablet Take 1 tablet (15 mg total) by mouth every 12 (twelve) hours as needed for pain (breakthrough pain). 07/27/17   Theodis Blaze, MD  ondansetron (ZOFRAN ODT) 4 MG disintegrating tablet Take 1 tablet (4 mg total) by mouth every 8 (eight) hours as needed for nausea or vomiting. 10/25/17   Odie Edmonds, PA-C  pantoprazole (PROTONIX) 40 MG tablet Take 1 tablet (40 mg total) by mouth daily. 07/16/17   Owens Shark, NP  polyethylene glycol [facility] / GLYCOLAX) packet Take 17 g by mouth daily as needed for mild constipation or moderate constipation. 07/27/17   Theodis Blaze, MD  potassium chloride SA (K-DUR,KLOR-CON) 20 MEQ tablet Take 1 tablet (20 mEq total) by mouth daily. 09/15/17   Ladell Pier, MD  prochlorperazine (COMPAZINE) 10 MG tablet Take 1 tablet (10 mg total) by mouth every 6 (six) hours as needed for nausea or vomiting. 07/07/17   Ladell Pier, MD    Family History Family History  Problem Relation Age of Onset  . Hypertension Mother   . Arthritis Mother   . Diabetes Father   . Heart disease Father        ?valvular problems  . Hyperlipidemia Son   . Diabetes Brother     Social History Social History   Tobacco Use  . Smoking status: Never Smoker  . Smokeless tobacco: Never Used  Substance Use Topics  . Alcohol use: No  . Drug use: No     Allergies   Patient has no known allergies.   Review of Systems Review  of Systems  Constitutional: Positive for fever. Negative for appetite change and chills.  HENT: Negative for ear pain, rhinorrhea, sneezing and sore throat.   Eyes: Negative for photophobia and visual disturbance.  Respiratory: Negative for cough, chest tightness, shortness of breath and wheezing.   Cardiovascular: Negative for chest pain and palpitations.  Gastrointestinal: Positive for abdominal pain. Negative for blood in stool, constipation, diarrhea, nausea and vomiting.  Genitourinary: Negative for dysuria, hematuria and urgency.  Musculoskeletal: Negative for myalgias.  Skin: Negative for rash.  Neurological: Negative for dizziness, weakness and light-headedness.     Physical Exam Updated Vital Signs BP 129/89   Pulse 89   Temp 98.8 F (37.1 C) (Oral)   Resp 16   Ht 5\' 5"  (1.651 m)  Wt 54.4 kg (120 lb)   SpO2 100%   BMI 19.97 kg/m   Physical Exam  Constitutional: She appears well-developed and well-nourished. No distress.  Nontoxic appearing and in no acute distress.  Does not appear dehydrated.  Pleasant.  HENT:  Head: Normocephalic and atraumatic.  Nose: Nose normal.  Eyes: Conjunctivae and EOM are normal. Left eye exhibits no discharge. No scleral icterus.  Neck: Normal range of motion. Neck supple.  Cardiovascular: Normal rate, regular rhythm, normal heart sounds and intact distal pulses. Exam reveals no gallop and no friction rub.  No murmur heard. Pulmonary/Chest: Effort normal and breath sounds normal. No respiratory distress.  Abdominal: Soft. Bowel sounds are normal. She exhibits no distension. There is tenderness. There is guarding. There is no rebound.  Area of the umbilicus erythematous, mildly necrotic and tender to palpation with guarding present.  Musculoskeletal: Normal range of motion. She exhibits no edema.  Neurological: She is alert. She exhibits normal muscle tone. Coordination normal.  Skin: Skin is warm and dry. No rash noted.  Psychiatric:  She has a normal mood and affect.  Nursing note and vitals reviewed.    ED Treatments / Results  Labs (all labs ordered are listed, but only abnormal results are displayed) Labs Reviewed  COMPREHENSIVE METABOLIC PANEL - Abnormal; Notable for the following components:      Result Value   Glucose, Bld 137 (*)    Albumin 3.4 (*)    AST 46 (*)    Alkaline Phosphatase 487 (*)    All other components within normal limits  CBC WITH DIFFERENTIAL/PLATELET - Abnormal; Notable for the following components:   RBC 3.41 (*)    Hemoglobin 9.8 (*)    HCT 31.2 (*)    RDW 17.8 (*)    Platelets 87 (*)    Lymphs Abs 0.5 (*)    All other components within normal limits  URINALYSIS, ROUTINE W REFLEX MICROSCOPIC - Abnormal; Notable for the following components:   Color, Urine AMBER (*)    Hgb urine dipstick MODERATE (*)    Ketones, ur 5 (*)    Protein, ur 30 (*)    Bacteria, UA RARE (*)    Squamous Epithelial / LPF 0-5 (*)    All other components within normal limits  CULTURE, BLOOD (ROUTINE X 2)  CULTURE, BLOOD (ROUTINE X 2)  URINE CULTURE  LIPASE, BLOOD  I-STAT CG4 LACTIC ACID, ED  I-STAT CG4 LACTIC ACID, ED    EKG  EKG Interpretation None       Radiology Dg Chest 2 View  Result Date: 10/25/2017 CLINICAL DATA:  Fever for the past 2 days. History of pancreatic cancer. EXAM: CHEST - 2 VIEW COMPARISON:  Chest x-ray dated August 31, 2017. FINDINGS: Unchanged right chest wall port catheter with the tip in the SVC. The heart size and mediastinal contours are within normal limits. Normal pulmonary vascularity. No focal consolidation, pleural effusion, or pneumothorax. No acute osseous abnormality. Unchanged common bile duct stent. IMPRESSION: No active cardiopulmonary disease. Electronically Signed   By: Titus Dubin M.D.   On: 10/25/2017 16:39   Ct Abdomen Pelvis W Contrast  Result Date: 10/25/2017 CLINICAL DATA:  52 year old female with stage IV pancreatic cancer and high fever  abdominal pain. Concern for abscess. EXAM: CT ABDOMEN AND PELVIS WITH CONTRAST TECHNIQUE: Multidetector CT imaging of the abdomen and pelvis was performed using the standard protocol following bolus administration of intravenous contrast. CONTRAST:  153mL ISOVUE-300 IOPAMIDOL (ISOVUE-300) INJECTION 61% COMPARISON:  Abdominal  CT dated 09/03/2017 FINDINGS: Lower chest: Bibasilar linear atelectasis/scarring. Probable trace pericardial effusion. No intra-abdominal free air. There is diffuse mesenteric edema and small ascites. Hepatobiliary: There are multiple hypoenhancing liver lesions with the largest measuring 1.9 x 2.0 cm (previously 1.7 x 1.7 cm) segment IVA. This lesion is not well characterized but concerning for metastatic disease. A 9 x 10 mm hypoenhancing lesion in the inferior right lobe of the liver (series 2, image 35) appears slightly larger compared to prior CT and is suspicious for metastatic disease. Faint areas of heterogeneous and decreased enhancement in the central liver. There is pneumobilia. A wall stent is in place and appears patent. There is air within the gallbladder. No calcified stone. Pancreas: Ill-defined hypoenhancing pancreatic head mass again noted. The region of the head of the pancreas measure approximately 5.6 x 4.8 cm on series 2, image 28). There is atrophy of the pancreas with dilatation of the main pancreatic duct. There is infiltration of the peripancreatic fat. Spleen: The spleen is enlarged measuring approximately 18 cm in length. Adrenals/Urinary Tract: The adrenal glands are grossly unremarkable as visualized. Bilateral extrarenal pelvis. There is mild left hydronephrosis, new compared to the prior CT. The urinary bladder is distended and grossly unremarkable. Stomach/Bowel: There is extensive inflammatory changes and edema of the duodenal C-loop as well as thickening of the jejunal folds. There is thickened and edematous appearance of the colon primarily involving the  proximal portion. Although this appearance may be related to ascites, findings concerning for enterocolitis. Correlation with clinical exam recommended. There is no bowel obstruction. The appendix is normal. Vascular/Lymphatic: The abdominal aorta and IVC appear unremarkable. The main portal vein is not visualized. There is cavernous transformation in the region of the porta hepaticus. There is infiltration of the fat surrounding the celiac axis, SMA, and IMA. Reproductive: The uterus and ovaries are grossly unremarkable. Other: There is a 1.6 x 2.4 cm enhancing lesion in the right paracolic gutter which has increased in size compared to the prior CT (previously measuring 13 x 13 mm) most consistent with metastatic disease. Musculoskeletal: No acute osseous pathology. IMPRESSION: 1. Findings concerning for enterocolitis. Clinical correlation is recommended. No bowel obstruction. Normal appendix. 2. Ill-defined pancreatic head mass consistent with known malignancy. 3. Multiple hepatic hypodense/hypoenhancing lesions which are not characterized with certainty on this CT but are concerning for metastatic disease. 4. Right paracolic metastatic nodule with increased size compared to prior CT. 5. Mild left hydronephrosis, new since the prior CT. 6. Diffuse mesenteric edema and small ascites. 7. No drainable fluid collection or abscess as clinically concerned. 8. Splenomegaly. Electronically Signed   By: Anner Crete M.D.   On: 10/25/2017 19:06    Procedures Procedures (including critical care time)  Medications Ordered in ED Medications  sodium chloride 0.9 % bolus 1,000 mL (0 mLs Intravenous Stopped 10/25/17 1804)  piperacillin-tazobactam (ZOSYN) IVPB 3.375 g (0 g Intravenous Stopped 10/25/17 1728)  vancomycin (VANCOCIN) IVPB 1000 mg/200 mL premix (0 mg Intravenous Stopped 10/25/17 1758)  iopamidol (ISOVUE-300) 61 % injection (  Contrast Given 10/25/17 1630)  iopamidol (ISOVUE-300) 61 % injection (100 mLs   Contrast Given 10/25/17 1819)  sodium chloride 0.9 % injection (  Given by Other 10/25/17 1855)     Initial Impression / Assessment and Plan / ED Course  I have reviewed the triage vital signs and the nursing notes.  Pertinent labs & imaging results that were available during my care of the patient were reviewed by me and considered in  my medical decision making (see chart for details).     Patient presents to ED with fever with T-max 102 that began yesterday.  She has a past medical history of stage IV pancreatic cancer with metastases to the liver.  Last chemotherapy was 3 weeks ago and she is due for the next in 2 days.  Here she is afebrile but did take Tylenol prior to arrival.  She does have some abdominal tenderness to palpation.  Lungs are clear to auscultation bilaterally.  She is not tachycardic or tachypneic and is satting 100% on room air.  She is overall well-appearing and does not appear dehydrated.  Blood cultures obtained.  Urinalysis with no evidence of UTI.  Lipase, CMP unremarkable.  CBC with normal white count and neutrophil count.  Lactate normal at x2.  Chest x-ray was negative.  CT of abdomen pelvis with findings of enterocolitis and known cancer and metastatic disease.  Patient reports significant improvement in her symptoms with supportive measures here.  She was given antibiotics here for possible infection.  I spoke to Dr. Earlie Server from oncology who recommends that if patient is feeling well and appears well, she should follow-up with her oncologist tomorrow.  Will give antibiotics for colitis, Zofran to be taken as needed and advised her to follow-up with oncology tomorrow.  Patient is in agreement with this plan.  She is able to tolerate p.o. intake without difficulty here.  Patient appears stable for discharge at this time.  Strict return precautions given.  Final Clinical Impressions(s) / ED Diagnoses   Final diagnoses:  Enterocolitis    ED Discharge Orders         Ordered    ciprofloxacin (CIPRO) 500 MG tablet  Every 12 hours     10/25/17 1936    ondansetron (ZOFRAN ODT) 4 MG disintegrating tablet  Every 8 hours PRN     10/25/17 1936       Delia Heady, PA-C 10/25/17 1942    Daleen Bo, MD 10/29/17 [phone]

## 2017-10-25 NOTE — ED Triage Notes (Signed)
She reports fever of 102 yesterday evening; feveror 100.6 at home today. She states she last received chemo a couple of weeks ago. She is pale and in no distress.

## 2017-10-25 NOTE — Discharge Instructions (Signed)
Please read attached information regarding her condition. Take ciprofloxacin beginning tomorrow to help with the infection. Take Zofran as needed for nausea. Follow-up with your oncologist tomorrow for further evaluation. Continue medications to help with fever. Return to ED for worsening symptoms, increased vomiting, severe abdominal pain, chest pain.

## 2017-10-25 NOTE — Progress Notes (Signed)
A consult was received from an ED physician for Vancomycin and Zosyn per pharmacy dosing.  The patient's profile has been reviewed for ht/wt/allergies/indication/available labs.   A one time order has been placed for Vancomycin 1gm IV and Zosyn 3.375gm IV.  Further antibiotics/pharmacy consults should be ordered by admitting physician if indicated.                       Thank you, Everette Rank, PharmD 10/25/2017  4:08 PM

## 2017-10-26 LAB — URINE CULTURE: Culture: NO GROWTH

## 2017-10-27 ENCOUNTER — Inpatient Hospital Stay: Payer: Medicaid Other

## 2017-10-27 ENCOUNTER — Inpatient Hospital Stay: Payer: Medicaid Other | Attending: Nurse Practitioner

## 2017-10-27 ENCOUNTER — Inpatient Hospital Stay (HOSPITAL_BASED_OUTPATIENT_CLINIC_OR_DEPARTMENT_OTHER): Payer: Medicaid Other | Admitting: Nurse Practitioner

## 2017-10-27 ENCOUNTER — Encounter: Payer: Self-pay | Admitting: Nurse Practitioner

## 2017-10-27 VITALS — BP 133/91 | HR 95 | Temp 98.4°F | Resp 17 | Ht 65.0 in | Wt 119.1 lb

## 2017-10-27 DIAGNOSIS — R509 Fever, unspecified: Secondary | ICD-10-CM | POA: Insufficient documentation

## 2017-10-27 DIAGNOSIS — G893 Neoplasm related pain (acute) (chronic): Secondary | ICD-10-CM | POA: Diagnosis not present

## 2017-10-27 DIAGNOSIS — Z95828 Presence of other vascular implants and grafts: Secondary | ICD-10-CM

## 2017-10-27 DIAGNOSIS — D6959 Other secondary thrombocytopenia: Secondary | ICD-10-CM | POA: Insufficient documentation

## 2017-10-27 DIAGNOSIS — C787 Secondary malignant neoplasm of liver and intrahepatic bile duct: Secondary | ICD-10-CM | POA: Diagnosis not present

## 2017-10-27 DIAGNOSIS — D696 Thrombocytopenia, unspecified: Secondary | ICD-10-CM | POA: Diagnosis not present

## 2017-10-27 DIAGNOSIS — C259 Malignant neoplasm of pancreas, unspecified: Secondary | ICD-10-CM

## 2017-10-27 DIAGNOSIS — N1330 Unspecified hydronephrosis: Secondary | ICD-10-CM | POA: Insufficient documentation

## 2017-10-27 DIAGNOSIS — Z5111 Encounter for antineoplastic chemotherapy: Secondary | ICD-10-CM | POA: Insufficient documentation

## 2017-10-27 DIAGNOSIS — C257 Malignant neoplasm of other parts of pancreas: Secondary | ICD-10-CM

## 2017-10-27 DIAGNOSIS — D6481 Anemia due to antineoplastic chemotherapy: Secondary | ICD-10-CM | POA: Diagnosis not present

## 2017-10-27 DIAGNOSIS — E876 Hypokalemia: Secondary | ICD-10-CM

## 2017-10-27 LAB — CBC WITH DIFFERENTIAL (CANCER CENTER ONLY)
Basophils Absolute: 0 10*3/uL (ref 0.0–0.1)
Basophils Relative: 0 %
EOS PCT: 1 %
Eosinophils Absolute: 0.1 10*3/uL (ref 0.0–0.5)
HEMATOCRIT: 29.2 % — AB (ref 34.8–46.6)
HEMOGLOBIN: 9 g/dL — AB (ref 11.6–15.9)
LYMPHS ABS: 0.9 10*3/uL (ref 0.9–3.3)
LYMPHS PCT: 17 %
MCH: 28 pg (ref 25.1–34.0)
MCHC: 30.8 g/dL — ABNORMAL LOW (ref 31.5–36.0)
MCV: 91 fL (ref 79.5–101.0)
Monocytes Absolute: 0.5 10*3/uL (ref 0.1–0.9)
Monocytes Relative: 10 %
Neutro Abs: 3.8 10*3/uL (ref 1.5–6.5)
Neutrophils Relative %: 72 %
Platelet Count: 79 10*3/uL — ABNORMAL LOW (ref 145–400)
RBC: 3.21 MIL/uL — ABNORMAL LOW (ref 3.70–5.45)
RDW: 17.4 % — ABNORMAL HIGH (ref 11.2–14.5)
WBC Count: 5.3 10*3/uL (ref 3.9–10.3)

## 2017-10-27 LAB — CMP (CANCER CENTER ONLY)
ALBUMIN: 2.9 g/dL — AB (ref 3.5–5.0)
ALT: 50 U/L (ref 0–55)
AST: 56 U/L — AB (ref 5–34)
Alkaline Phosphatase: 463 U/L — ABNORMAL HIGH (ref 40–150)
Anion gap: 9 (ref 3–11)
BUN: 5 mg/dL — AB (ref 7–26)
CHLORIDE: 104 mmol/L (ref 98–109)
CO2: 25 mmol/L (ref 22–29)
Calcium: 9.6 mg/dL (ref 8.4–10.4)
Creatinine: 0.69 mg/dL (ref 0.60–1.10)
GFR, Est AFR Am: >60 mL/min (ref >60)
Glucose, Bld: 144 mg/dL — ABNORMAL HIGH (ref 70–140)
POTASSIUM: 3.6 mmol/L (ref 3.5–5.1)
Sodium: 138 mmol/L (ref 136–145)
Total Bilirubin: 0.8 mg/dL (ref 0.2–1.2)
Total Protein: 6.8 g/dL (ref 6.4–8.3)

## 2017-10-27 MED ORDER — SODIUM CHLORIDE 0.9% FLUSH
10.0000 mL | Freq: Once | INTRAVENOUS | Status: AC
  Administered 2017-10-27: 10 mL
  Filled 2017-10-27: qty 10

## 2017-10-27 MED ORDER — HEPARIN SOD (PORK) LOCK FLUSH 100 UNIT/ML IV SOLN
500.0000 [IU] | Freq: Once | INTRAVENOUS | Status: AC
  Administered 2017-10-27: 500 [IU]
  Filled 2017-10-27: qty 5

## 2017-10-27 MED ORDER — HYDROMORPHONE HCL 4 MG PO TABS: 4.0000 mg | ORAL_TABLET | ORAL | 0 refills | Status: DC | PRN

## 2017-10-27 MED ORDER — POTASSIUM CHLORIDE CRYS ER 20 MEQ PO TBCR: 20.0000 meq | EXTENDED_RELEASE_TABLET | Freq: Every day | ORAL | 0 refills | Status: DC

## 2017-10-27 MED FILL — HYDROmorphone HCL 4 MG TABS: 4 | 2 days supply | Qty: 30 | Fill #0

## 2017-10-27 MED FILL — POTASSIUM CL ER 20 MEQ TABL: 20 | 30 days supply | Qty: 30 | Fill #0

## 2017-10-27 NOTE — Progress Notes (Signed)
[place] OFFICE PROGRESS NOTE   Diagnosis: Pancreas cancer  INTERVAL HISTORY:   Ms. Rebuck returns as scheduled.  She completed cycle 7 FOLFIRINOX 10/06/2017.  She denies nausea/vomiting.  No mouth sores.  No diarrhea.  No neuropathy symptoms.  She reports increased pain at the low abdomen/back for the past 1-1/2-2 weeks.  Her husband notes she is taking Dilaudid more frequently.  She reports taking Dilaudid about every 8 hours.  She was seen in the emergency department on 10/25/2017 with fever.  CT scan showed findings concerning for enterocolitis.  Also multiple hypoenhancing liver lesions noted with the largest measuring 1.9 x 2.0 cm, previously 1.7 x 1.7 cm; a lesion in the right lobe of the liver appeared slightly larger.  Ill-defined pancreatic head mass again noted.  A lesion in the right paracolic gutter was increased in size.  She was started on antibiotics in the emergency department, discharged on ciprofloxacin.  Blood cultures and urine cultures negative to date.  She has had no further fever or chills.  She is concerned that the umbilicus "looks abnormal".  No associated pain.  Denies any bleeding.  Objective:  Vital signs in last 24 hours:  Blood pressure (!) 133/91, pulse 95, temperature 98.4 F (36.9 C), temperature source Oral, resp. rate 17, height 5\' 5"  (1.651 m), weight 119 lb 1.6 oz (54 kg), SpO2 100 %.    HEENT: No thrush or ulcers. Resp: Lungs clear bilaterally. Cardio: Regular rate and rhythm. GI: Abdomen soft, mildly distended.  Generalized mild tenderness.  No hepatomegaly.  No apparent ascites. Vascular: No leg edema. Neuro: Alert and oriented. Skin: No rash.  Umbilicus with mild erythema/dark scab. Port-A-Cath without erythema.  Lab Results:  Lab Results  Component Value Date   WBC 5.3 10/27/2017   HGB 9.8 (L) 10/25/2017   HCT 29.2 (L) 10/27/2017   MCV 91.0 10/27/2017   PLT 79 (L) 10/27/2017   NEUTROABS 3.8 10/27/2017     Imaging:  Dg Chest 2 View  Result Date: 10/25/2017 CLINICAL DATA:  Fever for the past 2 days. History of pancreatic cancer. EXAM: CHEST - 2 VIEW COMPARISON:  Chest x-ray dated August 31, 2017. FINDINGS: Unchanged right chest wall port catheter with the tip in the SVC. The heart size and mediastinal contours are within normal limits. Normal pulmonary vascularity. No focal consolidation, pleural effusion, or pneumothorax. No acute osseous abnormality. Unchanged common bile duct stent. IMPRESSION: No active cardiopulmonary disease. Electronically Signed   By: Titus Dubin M.D.   On: 10/25/2017 16:39   Ct Abdomen Pelvis W Contrast  Result Date: 10/25/2017 CLINICAL DATA:  52 year old female with stage IV pancreatic cancer and high fever abdominal pain. Concern for abscess. EXAM: CT ABDOMEN AND PELVIS WITH CONTRAST TECHNIQUE: Multidetector CT imaging of the abdomen and pelvis was performed using the standard protocol following bolus administration of intravenous contrast. CONTRAST:  138mL ISOVUE-300 IOPAMIDOL (ISOVUE-300) INJECTION 61% COMPARISON:  Abdominal CT dated 09/03/2017 FINDINGS: Lower chest: Bibasilar linear atelectasis/scarring. Probable trace pericardial effusion. No intra-abdominal free air. There is diffuse mesenteric edema and small ascites. Hepatobiliary: There are multiple hypoenhancing liver lesions with the largest measuring 1.9 x 2.0 cm (previously 1.7 x 1.7 cm) segment IVA. This lesion is not well characterized but concerning for metastatic disease. A 9 x 10 mm hypoenhancing lesion in the inferior right lobe of the liver (series 2, image 35) appears slightly larger compared to prior CT and is suspicious for metastatic disease. Faint areas of heterogeneous and decreased enhancement  in the central liver. There is pneumobilia. A wall stent is in place and appears patent. There is air within the gallbladder. No calcified stone. Pancreas: Ill-defined hypoenhancing pancreatic head mass  again noted. The region of the head of the pancreas measure approximately 5.6 x 4.8 cm on series 2, image 28). There is atrophy of the pancreas with dilatation of the main pancreatic duct. There is infiltration of the peripancreatic fat. Spleen: The spleen is enlarged measuring approximately 18 cm in length. Adrenals/Urinary Tract: The adrenal glands are grossly unremarkable as visualized. Bilateral extrarenal pelvis. There is mild left hydronephrosis, new compared to the prior CT. The urinary bladder is distended and grossly unremarkable. Stomach/Bowel: There is extensive inflammatory changes and edema of the duodenal C-loop as well as thickening of the jejunal folds. There is thickened and edematous appearance of the colon primarily involving the proximal portion. Although this appearance may be related to ascites, findings concerning for enterocolitis. Correlation with clinical exam recommended. There is no bowel obstruction. The appendix is normal. Vascular/Lymphatic: The abdominal aorta and IVC appear unremarkable. The main portal vein is not visualized. There is cavernous transformation in the region of the porta hepaticus. There is infiltration of the fat surrounding the celiac axis, SMA, and IMA. Reproductive: The uterus and ovaries are grossly unremarkable. Other: There is a 1.6 x 2.4 cm enhancing lesion in the right paracolic gutter which has increased in size compared to the prior CT (previously measuring 13 x 13 mm) most consistent with metastatic disease. Musculoskeletal: No acute osseous pathology. IMPRESSION: 1. Findings concerning for enterocolitis. Clinical correlation is recommended. No bowel obstruction. Normal appendix. 2. Ill-defined pancreatic head mass consistent with known malignancy. 3. Multiple hepatic hypodense/hypoenhancing lesions which are not characterized with certainty on this CT but are concerning for metastatic disease. 4. Right paracolic metastatic nodule with increased size  compared to prior CT. 5. Mild left hydronephrosis, new since the prior CT. 6. Diffuse mesenteric edema and small ascites. 7. No drainable fluid collection or abscess as clinically concerned. 8. Splenomegaly. Electronically Signed   By: Anner Crete M.D.   On: 10/25/2017 19:06    Medications: I have reviewed the patient's current medications.  Assessment/Plan: 1. 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 Adenocarcinoma consistent with pancreatobiliary primary.  Cycle 1 FOLFIRINOX 06/18/2017  Cycle 2FOLFIRINOX 07/02/2017  Cycle 3 FOLFIRINOX 07/16/2017 (oxaliplatin dose reduced due to mild thrombocytopenia)  CT 07/24/2017-new splenomegaly and left hydronephrosis  Cycle 4 FOLFIRINOX 08/08/2017  Cycle 5 FOLFIRINOX 08/24/2017  CT abdomen/pelvis 09/03/2017-stable liver lesions and pancreas mass, stable right biliary dilatation, possible new 8 mm right liver lesion versus a vascular phenomena, lesion at the right paracolic gutter was present in the past-significance unclear  Cycle 6 FOLFIRINOX 09/15/2017  Cycle 7 FOLFIRINOX 10/06/2017 (Irinotecan and Oxaliplatin dose reduced due to thrombocytopenia)  CT abdomen/pelvis 10/25/2017- findings concerning for enterocolitis.  No bowel obstruction.  Ill-defined pancreatic head mass.  Multiple hepatic hypoenhancing lesions with the largest measuring 1.9 x 2.0 cm, previously 1.7 x 1.7 cm; 9 x 10 mm hypoenhancing lesion in the inferior right lobe of the liver slightly larger compared to the previous CT; 1.6 x 2.4 cm  enhancing lesion in the right paracolic gutter increased in size compared to the prior CT.  2. Pain secondary to #1.Improved.  3. Weight losssecondary to #1.  4. Microcytic anemia  Transfused with 1 unit of packed red blood cells 07/25/2017  5. Thrombocytopenia secondary to chemotherapy  6. Port-A-Cath placement 06/04/2017  7. Elevated liver enzymes/bilirubin10/22/2018-likely early biliary obstruction secondary to pancreas cancer, status post ERCP-placement of a common bile duct stent 06/09/2018  8.Postprandial abdominal pain. Protonix initiated 07/16/2017.Trial of Reglan initiated 08/24/2017.  9.Admission with fever 07/24/2017-no source for infection identified, resolved  10.Diarrheafollowing cycle 3 FOLFIRINOX-resolved  11.Left hydronephrosis noted on CT12/03/2017-etiology unclear, urology consulted-stent not recommended.  Mild left hydronephrosis noted on CT 10/25/2017.  Creatinine normal 10/27/2017.  12. Admission 08/31/2017 with a high fever, blood culture positive for gram-negative rods-Klebsiella pneumoniae identified  13. Anemia secondary to chemotherapy, chronic disease, and sepsis-status post transfusion with packed red blood cells 09/02/2017  14.  Fever 10/25/2017 status post evaluation in the emergency department.  CT with findings concerning for enterocolitis.  Currently on ciprofloxacin.  Urine and blood cultures negative to date.      Disposition: Ms. Bishara appears stable.  She has completed 7 cycles of FOLFIRINOX.  She had a recent CT scan due to increased abdominal pain and fever.  Findings include possible enterocolitis, increased size of liver lesions, increased size of a lesion right paracolic gutter.  She is completing a course of ciprofloxacin.  Fever has resolved.  She continues to have abdominal pain.   I discussed the CT results with Dr. Julien Nordmann in Dr. Gearldine Shown absence.  We will hold today's treatment due to concern for  possible disease progression, thrombocytopenia, possible infection.  She will continue Dilaudid as needed for pain.  She was provided with a new prescription at today's visit.  We discussed the thrombocytopenia.  She understands to contact the office with any bleeding.  She will return for a follow-up visit in 1 week.  She will contact the office in the interim as outlined above or with any other problems.  25 minutes were spent face-to-face at today's visit with the majority of that time involved in counseling/coordination of care.    [name] ANP/GNP-BC   10/27/2017  11:24 AM

## 2017-10-27 NOTE — Progress Notes (Signed)
Nutrition  Planning nutrition follow-up during infusion today but infusion held today.  Will follow-up as able.  Tonga Prout B. Zenia Resides, [place], [place] Registered Dietitian [phone] (pager)

## 2017-10-29 ENCOUNTER — Telehealth: Payer: Self-pay | Admitting: Nurse Practitioner

## 2017-10-29 ENCOUNTER — Ambulatory Visit: Payer: Medicaid Other

## 2017-10-29 NOTE — Telephone Encounter (Signed)
Scheduled appt per 3/12 los - patients husband is aware of appt date and time.

## 2017-10-30 LAB — CULTURE, BLOOD (ROUTINE X 2)
Culture: NO GROWTH
SPECIMEN DESCRIPTION: ADEQUATE

## 2017-10-31 LAB — CULTURE, BLOOD (ROUTINE X 2)
CULTURE: NO GROWTH
Special Requests: ADEQUATE

## 2017-11-03 ENCOUNTER — Other Ambulatory Visit: Payer: Self-pay | Admitting: Nurse Practitioner

## 2017-11-03 ENCOUNTER — Inpatient Hospital Stay: Payer: Medicaid Other

## 2017-11-03 ENCOUNTER — Inpatient Hospital Stay (HOSPITAL_BASED_OUTPATIENT_CLINIC_OR_DEPARTMENT_OTHER): Payer: Medicaid Other | Admitting: Oncology

## 2017-11-03 ENCOUNTER — Other Ambulatory Visit: Payer: Self-pay | Admitting: *Deleted

## 2017-11-03 ENCOUNTER — Encounter: Payer: Self-pay | Admitting: Oncology

## 2017-11-03 VITALS — BP 154/92 | HR 94 | Temp 98.6°F | Resp 18 | Ht 65.0 in | Wt 115.4 lb

## 2017-11-03 DIAGNOSIS — C259 Malignant neoplasm of pancreas, unspecified: Secondary | ICD-10-CM

## 2017-11-03 DIAGNOSIS — C257 Malignant neoplasm of other parts of pancreas: Secondary | ICD-10-CM | POA: Diagnosis not present

## 2017-11-03 DIAGNOSIS — C787 Secondary malignant neoplasm of liver and intrahepatic bile duct: Principal | ICD-10-CM

## 2017-11-03 DIAGNOSIS — Z95828 Presence of other vascular implants and grafts: Secondary | ICD-10-CM

## 2017-11-03 DIAGNOSIS — Z5111 Encounter for antineoplastic chemotherapy: Secondary | ICD-10-CM | POA: Diagnosis not present

## 2017-11-03 DIAGNOSIS — G893 Neoplasm related pain (acute) (chronic): Secondary | ICD-10-CM

## 2017-11-03 LAB — CMP (CANCER CENTER ONLY)
ALBUMIN: 3.1 g/dL — AB (ref 3.5–5.0)
ALT: 48 U/L (ref 0–55)
ANION GAP: 8 (ref 3–11)
AST: 55 U/L — ABNORMAL HIGH (ref 5–34)
Alkaline Phosphatase: 503 U/L — ABNORMAL HIGH (ref 40–150)
BUN: 6 mg/dL — ABNORMAL LOW (ref 7–26)
CO2: 27 mmol/L (ref 22–29)
Calcium: 10 mg/dL (ref 8.4–10.4)
Chloride: 104 mmol/L (ref 98–109)
Creatinine: 0.73 mg/dL (ref 0.60–1.10)
GFR, Estimated: >60 mL/min (ref >60)
GLUCOSE: 134 mg/dL (ref 70–140)
POTASSIUM: 3.9 mmol/L (ref 3.5–5.1)
SODIUM: 139 mmol/L (ref 136–145)
Total Bilirubin: 0.7 mg/dL (ref 0.2–1.2)
Total Protein: 7 g/dL (ref 6.4–8.3)

## 2017-11-03 LAB — CBC WITH DIFFERENTIAL (CANCER CENTER ONLY)
BASOS PCT: 1 %
Basophils Absolute: 0 10*3/uL (ref 0.0–0.1)
Eosinophils Absolute: 0.1 10*3/uL (ref 0.0–0.5)
Eosinophils Relative: 3 %
HEMATOCRIT: 29.3 % — AB (ref 34.8–46.6)
HEMOGLOBIN: 9.5 g/dL — AB (ref 11.6–15.9)
LYMPHS ABS: 0.7 10*3/uL — AB (ref 0.9–3.3)
LYMPHS PCT: 13 %
MCH: 28.2 pg (ref 25.1–34.0)
MCHC: 32.3 g/dL (ref 31.5–36.0)
MCV: 87.2 fL (ref 79.5–101.0)
MONO ABS: 0.5 10*3/uL (ref 0.1–0.9)
MONOS PCT: 9 %
NEUTROS ABS: 3.8 10*3/uL (ref 1.5–6.5)
NEUTROS PCT: 74 %
PLATELETS: 174 10*3/uL (ref 145–400)
RBC: 3.36 MIL/uL — ABNORMAL LOW (ref 3.70–5.45)
RDW: 18.4 % — AB (ref 11.2–14.5)
WBC Count: 5.1 10*3/uL (ref 3.9–10.3)

## 2017-11-03 MED ORDER — SODIUM CHLORIDE 0.9% FLUSH
10.0000 mL | Freq: Once | INTRAVENOUS | Status: AC
  Administered 2017-11-03: 10 mL
  Filled 2017-11-03: qty 10

## 2017-11-03 MED ORDER — MORPHINE SULFATE ER 15 MG PO TBCR: 15.0000 mg | EXTENDED_RELEASE_TABLET | Freq: Two times a day (BID) | ORAL | 0 refills | Status: DC | PRN

## 2017-11-03 MED ORDER — HEPARIN SOD (PORK) LOCK FLUSH 100 UNIT/ML IV SOLN
500.0000 [IU] | Freq: Once | INTRAVENOUS | Status: AC
  Administered 2017-11-03: 500 [IU]
  Filled 2017-11-03: qty 5

## 2017-11-03 MED FILL — MORPHINE SULF ER 15 MG TAB: 15 | 30 days supply | Qty: 60 | Fill #0

## 2017-11-03 NOTE — Progress Notes (Signed)
Patient wants port to remain accessed for infusion tomorrow. Port dressing accordingly. Bio patch and Sorba view dressing applied. Patient educated to keep dressing dry and intact.

## 2017-11-03 NOTE — Progress Notes (Signed)
DISCONTINUE ON PATHWAY REGIMEN - Pancreatic     A cycle is every 14 days:     Oxaliplatin      Leucovorin      Irinotecan      5-Fluorouracil      5-Fluorouracil   **Always confirm dose/schedule in your pharmacy ordering system**    REASON: Disease Progression PRIOR TREATMENT: PANOS55: FOLFIRINOX q14 Days x 6 Months TREATMENT RESPONSE: Progressive Disease (PD)  START ON PATHWAY REGIMEN - Pancreatic     A cycle is every 28 days:     Nab-paclitaxel (protein bound)      Gemcitabine   **Always confirm dose/schedule in your pharmacy ordering system**    Patient Characteristics: Adenocarcinoma, Metastatic Disease, Second Line, MSS/pMMR or MSI Unknown, If FOLFIRINOX First Line Histology: Adenocarcinoma Current evidence of distant metastases<= Yes AJCC T Category: Staged < 8th Ed. AJCC N Category: Staged < 8th Ed. AJCC M Category: Staged < 8th Ed. AJCC 8 Stage Grouping: Staged < 8th Ed. Line of Therapy: Second Line Would you be surprised if this patient died  in the next year<= I would NOT be surprised if this patient died in the next year Microsatellite/Mismatch Repair Status: Unknown Intent of Therapy: Non-Curative / Palliative Intent, Discussed with Patient

## 2017-11-03 NOTE — Progress Notes (Addendum)
[place] [place] OFFICE PROGRESS NOTE   Diagnosis: Pancreas cancer  INTERVAL HISTORY:   Ms. Ewton returns as scheduled.  She completed cycle 7 FOLFIRINOX 10/06/2017.  Cycle 8 was held 10/27/2017 for several reasons including concern for possible disease progression, thrombocytopenia and possible infection.  She continues to have increased abdominal pain.  She is taking pain medication about every 5 hours.  No further fever or chills.  She completed the course of antibiotic.  Appetite is poor.  She has lost some weight.  No nausea or vomiting.  She has a bowel movement every other day.  She is concerned about the appearance of her bellybutton.  Objective:  Vital signs in last 24 hours:  Blood pressure (!) 154/92, pulse 94, temperature 98.6 F (37 C), temperature source Oral, resp. rate 18, height 5\' 5"  (1.651 m), weight 115 lb 6.4 oz (52.3 kg), SpO2 100 %.    HEENT: No thrush or ulcers.  Mouth appears dry. Resp: Lungs clear bilaterally. Cardio: Regular rate and rhythm. GI: Abdomen is soft, distended.  Question ascites.  No hepatomegaly.  Nodularity/discoloration at the umbilicus. Vascular: No leg edema. Port-A-Cath without erythema.  Lab Results:  Lab Results  Component Value Date   WBC 5.1 11/03/2017   HGB 9.8 (L) 10/25/2017   HCT 29.3 (L) 11/03/2017   MCV 87.2 11/03/2017   PLT 174 11/03/2017   NEUTROABS 3.8 11/03/2017    Imaging:  No results found.  Medications: I have reviewed the patient's current medications.  Assessment/Plan: 1. 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 Adenocarcinoma consistent with pancreatobiliary primary.  Cycle 1 FOLFIRINOX 06/18/2017  Cycle 2FOLFIRINOX 07/02/2017  Cycle 3 FOLFIRINOX 07/16/2017 (oxaliplatin dose reduced due to mild thrombocytopenia)  CT 07/24/2017-new splenomegaly and left hydronephrosis  Cycle 4 FOLFIRINOX 08/08/2017  Cycle 5 FOLFIRINOX 08/24/2017  CT abdomen/pelvis 09/03/2017-stable liver lesions and pancreas mass, stable right biliary dilatation, possible new 8 mm right liver lesion versus a vascular phenomena, lesion at the right paracolic gutter was present in the past-significance unclear  Cycle 6 FOLFIRINOX1/29/2019  Cycle7 FOLFIRINOX2/19/2019 (Irinotecan andOxaliplatin dose reduced due to thrombocytopenia)  CT abdomen/pelvis 10/25/2017- findings concerning for enterocolitis.  No bowel obstruction.  Ill-defined pancreatic head mass.  Multiple hepatic hypoenhancing lesions with the largest measuring 1.9 x 2.0 cm, previously 1.7 x 1.7 cm; 9 x 10 mm hypoenhancing lesion in the inferior right lobe of the liver slightly larger compared to the previous CT; 1.6 x 2.4 cm enhancing lesion in the right paracolic gutter increased in size compared to the prior CT.  2. Pain secondary to #1.Improved.  3. Weight losssecondary to #1.  4. Microcytic anemia  Transfused with 1 unit of packed red blood cells 07/25/2017  5. Thrombocytopenia secondary to chemotherapy  6. Port-A-Cath placement 06/04/2017  7. Elevated liver enzymes/bilirubin10/22/2018-likely early biliary obstruction secondary to pancreas cancer, status post ERCP-placement of a common bile duct stent 06/09/2018  8.Postprandial abdominal pain. Protonix initiated 07/16/2017.Trial of Reglan initiated 08/24/2017.  9.Admission with fever 07/24/2017-no source for infection identified, resolved   10.Diarrheafollowing cycle 3 FOLFIRINOX-resolved  11.Left  hydronephrosis noted on CT12/03/2017-etiology unclear, urology consulted-stent not recommended.  Mild left hydronephrosis noted on  CT 10/25/2017.  Creatinine normal 10/27/2017.  12. Admission 08/31/2017 with a high fever, blood culture positive for gram-negative rods-Klebsiella pneumoniae identified  13. Anemia secondary to chemotherapy, chronic disease, and sepsis-status post transfusion with packed red blood cells 09/02/2017  14.  Fever 10/25/2017 status post evaluation in the emergency department.  CT with findings concerning for enterocolitis.  Currently on ciprofloxacin.  Urine and blood cultures negative to date.    Disposition: Ms. Mccartin appears unchanged.  She has completed 7 cycles of FOLFIRINOX.  The recent CT scan shows evidence of disease progression.  Dr. Benay Spice recommends discontinuing FOLFIRINOX and beginning gemcitabine/Abraxane.  We reviewed potential toxicities including bone marrow toxicity, nausea, hair loss.  We discussed potential toxicities associated with gemcitabine including fever, rash, pneumonitis.  We discussed the neuropathy associated with Abraxane.  She agrees to proceed.  Gemcitabine/Abraxane will be given on an every 2-week schedule with cycle 1 11/04/2017.    We adjusted the pain regimen today.  She will continue Dilaudid as needed.  She will resume MS Contin 15 mg every 12 hours.    She will return for lab, follow-up and cycle 2 on 11/18/2017.  She will contact the office in the interim with any problems.  Patient seen with Dr. Benay Spice.  CT images reviewed on the computer with Ms. Ziehm and her husband.  25 minutes were spent face-to-face at today's visit with the majority of that time involved in counseling/coordination of care.    [name] ANP/GNP-BC   11/03/2017  11:44 AM This was a shared visit with [name].  Ms. Greenberger was interviewed and examined.  There is clinical and x-ray evidence of disease progression.  I reviewed CT images with her.  We  discussed salvage therapy with gemcitabine/Abraxane and referral to [facility] to consider a clinical trial.  She would like to proceed with gemcitabine/Abraxane.  We reviewed the potential toxicities associated with this regimen.  The plan is to begin gemcitabine/Abraxane on a 2-week schedule on 11/04/2017.  We adjusted the narcotic pain regimen.  [name], MD

## 2017-11-04 ENCOUNTER — Inpatient Hospital Stay: Payer: Medicaid Other

## 2017-11-04 ENCOUNTER — Telehealth: Payer: Self-pay | Admitting: *Deleted

## 2017-11-04 VITALS — BP 131/95 | HR 96 | Temp 98.4°F | Resp 18

## 2017-11-04 DIAGNOSIS — C259 Malignant neoplasm of pancreas, unspecified: Secondary | ICD-10-CM

## 2017-11-04 DIAGNOSIS — C787 Secondary malignant neoplasm of liver and intrahepatic bile duct: Principal | ICD-10-CM

## 2017-11-04 DIAGNOSIS — Z5111 Encounter for antineoplastic chemotherapy: Secondary | ICD-10-CM | POA: Diagnosis not present

## 2017-11-04 MED ORDER — PROCHLORPERAZINE MALEATE 10 MG PO TABS
10.0000 mg | ORAL_TABLET | Freq: Once | ORAL | Status: AC
  Administered 2017-11-04: 10 mg via ORAL

## 2017-11-04 MED ORDER — SODIUM CHLORIDE 0.9 % IV SOLN
Freq: Once | INTRAVENOUS | Status: AC
  Administered 2017-11-04: 12:00:00 via INTRAVENOUS

## 2017-11-04 MED ORDER — PROCHLORPERAZINE MALEATE 10 MG PO TABS
ORAL_TABLET | ORAL | Status: AC
  Filled 2017-11-04: qty 1

## 2017-11-04 MED ORDER — PACLITAXEL PROTEIN-BOUND CHEMO INJECTION 100 MG
125.0000 mg/m2 | Freq: Once | INTRAVENOUS | Status: AC
  Administered 2017-11-04: 200 mg via INTRAVENOUS
  Filled 2017-11-04: qty 40

## 2017-11-04 MED ORDER — SODIUM CHLORIDE 0.9 % IV SOLN
800.0000 mg/m2 | Freq: Once | INTRAVENOUS | Status: AC
  Administered 2017-11-04: 1254 mg via INTRAVENOUS
  Filled 2017-11-04: qty 32.98

## 2017-11-04 MED ORDER — SODIUM CHLORIDE 0.9% FLUSH
10.0000 mL | INTRAVENOUS | Status: DC | PRN
  Administered 2017-11-04: 10 mL
  Filled 2017-11-04: qty 10

## 2017-11-04 MED ORDER — HEPARIN SOD (PORK) LOCK FLUSH 100 UNIT/ML IV SOLN
500.0000 [IU] | Freq: Once | INTRAVENOUS | Status: AC | PRN
  Administered 2017-11-04: 500 [IU]
  Filled 2017-11-04: qty 5

## 2017-11-04 NOTE — Patient Instructions (Signed)
[facility] Discharge Instructions for Patients Receiving Chemotherapy  Today you received the following chemotherapy agents paclitaxel protein-bound (Abraxane) and gemcitabine (Gemzar)  To help prevent nausea and vomiting after your treatment, we encourage you to take your nausea medication as directed by your physician.   If you develop nausea and vomiting that is not controlled by your nausea medication, call the clinic.   BELOW ARE SYMPTOMS THAT SHOULD BE REPORTED IMMEDIATELY:  *FEVER GREATER THAN 100.5 F  *CHILLS WITH OR WITHOUT FEVER  NAUSEA AND VOMITING THAT IS NOT CONTROLLED WITH YOUR NAUSEA MEDICATION  *UNUSUAL SHORTNESS OF BREATH  *UNUSUAL BRUISING OR BLEEDING  TENDERNESS IN MOUTH AND THROAT WITH OR WITHOUT PRESENCE OF ULCERS  *URINARY PROBLEMS  *BOWEL PROBLEMS  UNUSUAL RASH Items with * indicate a potential emergency and should be followed up as soon as possible.  Feel free to call the clinic should you have any questions or concerns. The clinic phone number is [phone].  Please show the [facility] at check-in to the Emergency Department and triage nurse.  Nanoparticle Albumin-Bound Paclitaxel injection What is this medicine? NANOPARTICLE ALBUMIN-BOUND PACLITAXEL (Na no PAHR ti kuhl al BYOO muhn-bound PAK li TAX el) is a chemotherapy drug. It targets fast dividing cells, like cancer cells, and causes these cells to die. This medicine is used to treat advanced breast cancer and advanced lung cancer. This medicine may be used for other purposes; ask your health care provider or pharmacist if you have questions. COMMON BRAND NAME(S): Abraxane What should I tell my health care provider before I take this medicine? They need to know if you have any of these conditions: -kidney disease -liver disease -low blood counts, like low platelets, red blood cells, or white blood cells -recent or ongoing radiation therapy -an unusual or allergic  reaction to paclitaxel, albumin, other chemotherapy, other medicines, foods, dyes, or preservatives -pregnant or trying to get pregnant -breast-feeding How should I use this medicine? This drug is given as an infusion into a vein. It is administered in a hospital or clinic by a specially trained health care professional. Talk to your pediatrician regarding the use of this medicine in children. Special care may be needed. Overdosage: If you think you have taken too much of this medicine contact a poison control center or emergency room at once. NOTE: This medicine is only for you. Do not share this medicine with others. What if I miss a dose? It is important not to miss your dose. Call your doctor or health care professional if you are unable to keep an appointment. What may interact with this medicine? -cyclosporine -diazepam -ketoconazole -medicines to increase blood counts like filgrastim, pegfilgrastim, sargramostim -other chemotherapy drugs like cisplatin, doxorubicin, epirubicin, etoposide, teniposide, vincristine -quinidine -testosterone -vaccines -verapamil Talk to your doctor or health care professional before taking any of these medicines: -acetaminophen -aspirin -ibuprofen -ketoprofen -naproxen This list may not describe all possible interactions. Give your health care provider a list of all the medicines, herbs, non-prescription drugs, or dietary supplements you use. Also tell them if you smoke, drink alcohol, or use illegal drugs. Some items may interact with your medicine. What should I watch for while using this medicine? Your condition will be monitored carefully while you are receiving this medicine. You will need important blood work done while you are taking this medicine. This medicine can cause serious allergic reactions. If you experience allergic reactions like skin rash, itching or hives, swelling of the face, lips, or  tongue, tell your doctor or health care  professional right away. In some cases, you may be given additional medicines to help with side effects. Follow all directions for their use. This drug may make you feel generally unwell. This is not uncommon, as chemotherapy can affect healthy cells as well as cancer cells. Report any side effects. Continue your course of treatment even though you feel ill unless your doctor tells you to stop. Call your doctor or health care professional for advice if you get a fever, chills or sore throat, or other symptoms of a cold or flu. Do not treat yourself. This drug decreases your body's ability to fight infections. Try to avoid being around people who are sick. This medicine may increase your risk to bruise or bleed. Call your doctor or health care professional if you notice any unusual bleeding. Be careful brushing and flossing your teeth or using a toothpick because you may get an infection or bleed more easily. If you have any dental work done, tell your dentist you are receiving this medicine. Avoid taking products that contain aspirin, acetaminophen, ibuprofen, naproxen, or ketoprofen unless instructed by your doctor. These medicines may hide a fever. Do not become pregnant while taking this medicine. Women should inform their doctor if they wish to become pregnant or think they might be pregnant. There is a potential for serious side effects to an unborn child. Talk to your health care professional or pharmacist for more information. Do not breast-feed an infant while taking this medicine. Men are advised not to father a child while receiving this medicine. What side effects may I notice from receiving this medicine? Side effects that you should report to your doctor or health care professional as soon as possible: -allergic reactions like skin rash, itching or hives, swelling of the face, lips, or tongue -low blood counts - This drug may decrease the number of white blood cells, red blood cells and  platelets. You may be at increased risk for infections and bleeding. -signs of infection - fever or chills, cough, sore throat, pain or difficulty passing urine -signs of decreased platelets or bleeding - bruising, pinpoint red spots on the skin, black, tarry stools, nosebleeds -signs of decreased red blood cells - unusually weak or tired, fainting spells, lightheadedness -breathing problems -changes in vision -chest pain -high or low blood pressure -mouth sores -nausea and vomiting -pain, swelling, redness or irritation at the injection site -pain, tingling, numbness in the hands or feet -slow or irregular heartbeat -swelling of the ankle, feet, hands Side effects that usually do not require medical attention (report to your doctor or health care professional if they continue or are bothersome): -aches, pains -changes in the color of fingernails -diarrhea -hair loss -loss of appetite This list may not describe all possible side effects. Call your doctor for medical advice about side effects. You may report side effects to FDA at 1-800-FDA-1088. Where should I keep my medicine? This drug is given in a hospital or clinic and will not be stored at home. NOTE: This sheet is a summary. It may not cover all possible information. If you have questions about this medicine, talk to your doctor, pharmacist, or health care provider.  2018 [employer]/Gold Standard (2015-06-06 10:05:20)  Gemcitabine injection What is this medicine? GEMCITABINE (jem SIT a been) is a chemotherapy drug. This medicine is used to treat many types of cancer like breast cancer, lung cancer, pancreatic cancer, and ovarian cancer. This medicine may be used for other  purposes; ask your health care provider or pharmacist if you have questions. COMMON BRAND NAME(S): Gemzar What should I tell my health care provider before I take this medicine? They need to know if you have any of these conditions: -blood  disorders -infection -kidney disease -liver disease -recent or ongoing radiation therapy -an unusual or allergic reaction to gemcitabine, other chemotherapy, other medicines, foods, dyes, or preservatives -pregnant or trying to get pregnant -breast-feeding How should I use this medicine? This drug is given as an infusion into a vein. It is administered in a hospital or clinic by a specially trained health care professional. Talk to your pediatrician regarding the use of this medicine in children. Special care may be needed. Overdosage: If you think you have taken too much of this medicine contact a poison control center or emergency room at once. NOTE: This medicine is only for you. Do not share this medicine with others. What if I miss a dose? It is important not to miss your dose. Call your doctor or health care professional if you are unable to keep an appointment. What may interact with this medicine? -medicines to increase blood counts like filgrastim, pegfilgrastim, sargramostim -some other chemotherapy drugs like cisplatin -vaccines Talk to your doctor or health care professional before taking any of these medicines: -acetaminophen -aspirin -ibuprofen -ketoprofen -naproxen This list may not describe all possible interactions. Give your health care provider a list of all the medicines, herbs, non-prescription drugs, or dietary supplements you use. Also tell them if you smoke, drink alcohol, or use illegal drugs. Some items may interact with your medicine. What should I watch for while using this medicine? Visit your doctor for checks on your progress. This drug may make you feel generally unwell. This is not uncommon, as chemotherapy can affect healthy cells as well as cancer cells. Report any side effects. Continue your course of treatment even though you feel ill unless your doctor tells you to stop. In some cases, you may be given additional medicines to help with side effects.  Follow all directions for their use. Call your doctor or health care professional for advice if you get a fever, chills or sore throat, or other symptoms of a cold or flu. Do not treat yourself. This drug decreases your body's ability to fight infections. Try to avoid being around people who are sick. This medicine may increase your risk to bruise or bleed. Call your doctor or health care professional if you notice any unusual bleeding. Be careful brushing and flossing your teeth or using a toothpick because you may get an infection or bleed more easily. If you have any dental work done, tell your dentist you are receiving this medicine. Avoid taking products that contain aspirin, acetaminophen, ibuprofen, naproxen, or ketoprofen unless instructed by your doctor. These medicines may hide a fever. Women should inform their doctor if they wish to become pregnant or think they might be pregnant. There is a potential for serious side effects to an unborn child. Talk to your health care professional or pharmacist for more information. Do not breast-feed an infant while taking this medicine. What side effects may I notice from receiving this medicine? Side effects that you should report to your doctor or health care professional as soon as possible: -allergic reactions like skin rash, itching or hives, swelling of the face, lips, or tongue -low blood counts - this medicine may decrease the number of white blood cells, red blood cells and platelets. You may be at  increased risk for infections and bleeding. -signs of infection - fever or chills, cough, sore throat, pain or difficulty passing urine -signs of decreased platelets or bleeding - bruising, pinpoint red spots on the skin, black, tarry stools, blood in the urine -signs of decreased red blood cells - unusually weak or tired, fainting spells, lightheadedness -breathing problems -chest pain -mouth sores -nausea and vomiting -pain, swelling, redness at  site where injected -pain, tingling, numbness in the hands or feet -stomach pain -swelling of ankles, feet, hands -unusual bleeding Side effects that usually do not require medical attention (report to your doctor or health care professional if they continue or are bothersome): -constipation -diarrhea -hair loss -loss of appetite -stomach upset This list may not describe all possible side effects. Call your doctor for medical advice about side effects. You may report side effects to FDA at 1-800-FDA-1088. Where should I keep my medicine? This drug is given in a hospital or clinic and will not be stored at home. NOTE: This sheet is a summary. It may not cover all possible information. If you have questions about this medicine, talk to your doctor, pharmacist, or health care provider.  2018 Elsevier/Gold Standard (2007-12-14 18:45:54)

## 2017-11-04 NOTE — Telephone Encounter (Signed)
Call from pt asking if she needs Neulasta and Decadron with this treatment. Discussed side effect profile of current regimen. Instructed pt to call if she develops nausea that is not relieved with Compazine and Zofran. Informed her Neulasta is not planned with cycle 1. Dr. Benay Spice may add it in the future if blood counts drop. She voiced understanding.

## 2017-11-04 NOTE — Progress Notes (Signed)
Met with patient and husband in infusion room to offer support and encouragement.  Patient shared that she is getting a new chemo regimen and "hoped it works." Patient shared that she has good family support. Patient encouraged to call with with questions or concerns.

## 2017-11-05 LAB — CANCER ANTIGEN 19-9: CA 19-9: 6260 U/mL — ABNORMAL HIGH (ref 0–35)

## 2017-11-09 ENCOUNTER — Telehealth: Payer: Self-pay | Admitting: Oncology

## 2017-11-09 NOTE — Telephone Encounter (Signed)
Spoke with patients husband regarding upcoming appointment per 3/20 los

## 2017-11-11 ENCOUNTER — Other Ambulatory Visit: Payer: Self-pay | Admitting: *Deleted

## 2017-11-11 ENCOUNTER — Inpatient Hospital Stay (HOSPITAL_COMMUNITY): Payer: Medicaid Other

## 2017-11-11 ENCOUNTER — Inpatient Hospital Stay (HOSPITAL_COMMUNITY)
Admission: AD | Admit: 2017-11-11 | Discharge: 2017-11-14 | DRG: 872 | Disposition: A | Discharge status: Home / Self Care | Payer: Medicaid Other | Source: Ambulatory Visit | Attending: Internal Medicine | Admitting: Internal Medicine

## 2017-11-11 ENCOUNTER — Inpatient Hospital Stay (HOSPITAL_BASED_OUTPATIENT_CLINIC_OR_DEPARTMENT_OTHER): Payer: Medicaid Other | Admitting: Medical

## 2017-11-11 ENCOUNTER — Inpatient Hospital Stay: Payer: Medicaid Other

## 2017-11-11 ENCOUNTER — Encounter (HOSPITAL_COMMUNITY): Payer: Self-pay | Admitting: *Deleted

## 2017-11-11 VITALS — BP 131/81 | HR 113 | Temp 100.1°F | Resp 19 | Wt 117.0 lb

## 2017-11-11 DIAGNOSIS — Z681 Body mass index (BMI) 19 or less, adult: Secondary | ICD-10-CM | POA: Diagnosis not present

## 2017-11-11 DIAGNOSIS — C259 Malignant neoplasm of pancreas, unspecified: Secondary | ICD-10-CM | POA: Diagnosis present

## 2017-11-11 DIAGNOSIS — Z79891 Long term (current) use of opiate analgesic: Secondary | ICD-10-CM

## 2017-11-11 DIAGNOSIS — Z5111 Encounter for antineoplastic chemotherapy: Secondary | ICD-10-CM | POA: Diagnosis present

## 2017-11-11 DIAGNOSIS — C787 Secondary malignant neoplasm of liver and intrahepatic bile duct: Secondary | ICD-10-CM | POA: Diagnosis present

## 2017-11-11 DIAGNOSIS — D696 Thrombocytopenia, unspecified: Secondary | ICD-10-CM | POA: Diagnosis present

## 2017-11-11 DIAGNOSIS — R6520 Severe sepsis without septic shock: Secondary | ICD-10-CM

## 2017-11-11 DIAGNOSIS — D638 Anemia in other chronic diseases classified elsewhere: Secondary | ICD-10-CM | POA: Diagnosis present

## 2017-11-11 DIAGNOSIS — T451X5A Adverse effect of antineoplastic and immunosuppressive drugs, initial encounter: Secondary | ICD-10-CM | POA: Diagnosis present

## 2017-11-11 DIAGNOSIS — E876 Hypokalemia: Secondary | ICD-10-CM | POA: Diagnosis present

## 2017-11-11 DIAGNOSIS — I10 Essential (primary) hypertension: Secondary | ICD-10-CM | POA: Diagnosis present

## 2017-11-11 DIAGNOSIS — R748 Abnormal levels of other serum enzymes: Secondary | ICD-10-CM | POA: Diagnosis present

## 2017-11-11 DIAGNOSIS — Z833 Family history of diabetes mellitus: Secondary | ICD-10-CM

## 2017-11-11 DIAGNOSIS — R509 Fever, unspecified: Secondary | ICD-10-CM

## 2017-11-11 DIAGNOSIS — R21 Rash and other nonspecific skin eruption: Secondary | ICD-10-CM | POA: Diagnosis not present

## 2017-11-11 DIAGNOSIS — L299 Pruritus, unspecified: Secondary | ICD-10-CM | POA: Diagnosis present

## 2017-11-11 DIAGNOSIS — Z79899 Other long term (current) drug therapy: Secondary | ICD-10-CM | POA: Diagnosis not present

## 2017-11-11 DIAGNOSIS — Z8261 Family history of arthritis: Secondary | ICD-10-CM | POA: Diagnosis not present

## 2017-11-11 DIAGNOSIS — D6481 Anemia due to antineoplastic chemotherapy: Secondary | ICD-10-CM | POA: Diagnosis not present

## 2017-11-11 DIAGNOSIS — R945 Abnormal results of liver function studies: Secondary | ICD-10-CM | POA: Diagnosis not present

## 2017-11-11 DIAGNOSIS — Z8249 Family history of ischemic heart disease and other diseases of the circulatory system: Secondary | ICD-10-CM

## 2017-11-11 DIAGNOSIS — R7303 Prediabetes: Secondary | ICD-10-CM | POA: Diagnosis present

## 2017-11-11 DIAGNOSIS — A4159 Other Gram-negative sepsis: Secondary | ICD-10-CM | POA: Diagnosis present

## 2017-11-11 DIAGNOSIS — E440 Moderate protein-calorie malnutrition: Secondary | ICD-10-CM | POA: Diagnosis present

## 2017-11-11 DIAGNOSIS — D6959 Other secondary thrombocytopenia: Secondary | ICD-10-CM | POA: Diagnosis present

## 2017-11-11 DIAGNOSIS — K529 Noninfective gastroenteritis and colitis, unspecified: Secondary | ICD-10-CM | POA: Diagnosis present

## 2017-11-11 DIAGNOSIS — G893 Neoplasm related pain (acute) (chronic): Secondary | ICD-10-CM | POA: Diagnosis present

## 2017-11-11 DIAGNOSIS — N1330 Unspecified hydronephrosis: Secondary | ICD-10-CM | POA: Diagnosis not present

## 2017-11-11 DIAGNOSIS — A419 Sepsis, unspecified organism: Secondary | ICD-10-CM

## 2017-11-11 DIAGNOSIS — C257 Malignant neoplasm of other parts of pancreas: Secondary | ICD-10-CM | POA: Diagnosis not present

## 2017-11-11 DIAGNOSIS — R7989 Other specified abnormal findings of blood chemistry: Secondary | ICD-10-CM | POA: Diagnosis present

## 2017-11-11 LAB — INFLUENZA PANEL BY PCR (TYPE A & B)
Influenza A By PCR: NEGATIVE
Influenza B By PCR: NEGATIVE

## 2017-11-11 LAB — URINALYSIS, COMPLETE (UACMP) WITH MICROSCOPIC
Bacteria, UA: NONE SEEN
Bilirubin Urine: NEGATIVE
GLUCOSE, UA: NEGATIVE mg/dL
Ketones, ur: NEGATIVE mg/dL
Leukocytes, UA: NEGATIVE
NITRITE: NEGATIVE
Protein, ur: NEGATIVE mg/dL
SPECIFIC GRAVITY, URINE: 1.014 (ref 1.005–1.030)
pH: 7 (ref 5.0–8.0)

## 2017-11-11 LAB — CBC WITH DIFFERENTIAL (CANCER CENTER ONLY)
Basophils Absolute: 0 10*3/uL (ref 0.0–0.1)
Basophils Relative: 1 %
EOS ABS: 0 10*3/uL (ref 0.0–0.5)
EOS PCT: 0 %
HCT: 28.2 % — ABNORMAL LOW (ref 34.8–46.6)
Hemoglobin: 9.2 g/dL — ABNORMAL LOW (ref 11.6–15.9)
Lymphocytes Relative: 4 %
Lymphs Abs: 0.1 10*3/uL — ABNORMAL LOW (ref 0.9–3.3)
MCH: 27.7 pg (ref 25.1–34.0)
MCHC: 32.7 g/dL (ref 31.5–36.0)
MCV: 84.7 fL (ref 79.5–101.0)
MONO ABS: 0.1 10*3/uL (ref 0.1–0.9)
MONOS PCT: 4 %
Neutro Abs: 3 10*3/uL (ref 1.5–6.5)
Neutrophils Relative %: 91 %
PLATELETS: 55 10*3/uL — AB (ref 145–400)
RBC: 3.33 MIL/uL — ABNORMAL LOW (ref 3.70–5.45)
RDW: 17.4 % — AB (ref 11.2–14.5)
WBC Count: 3.3 10*3/uL — ABNORMAL LOW (ref 3.9–10.3)

## 2017-11-11 LAB — CMP (CANCER CENTER ONLY)
ALK PHOS: 536 U/L — AB (ref 40–150)
ALT: 94 U/L — AB (ref 0–55)
AST: 151 U/L — ABNORMAL HIGH (ref 5–34)
Albumin: 3 g/dL — ABNORMAL LOW (ref 3.5–5.0)
Anion gap: 11 (ref 3–11)
BUN: 9 mg/dL (ref 7–26)
CALCIUM: 9.3 mg/dL (ref 8.4–10.4)
CO2: 22 mmol/L (ref 22–29)
CREATININE: 0.77 mg/dL (ref 0.60–1.10)
Chloride: 101 mmol/L (ref 98–109)
GFR, Estimated: >60 mL/min (ref >60)
Glucose, Bld: 129 mg/dL (ref 70–140)
Potassium: 4.1 mmol/L (ref 3.5–5.1)
Sodium: 134 mmol/L — ABNORMAL LOW (ref 136–145)
Total Bilirubin: 2.3 mg/dL — ABNORMAL HIGH (ref 0.2–1.2)
Total Protein: 7.1 g/dL (ref 6.4–8.3)

## 2017-11-11 LAB — APTT: aPTT: 40 seconds — ABNORMAL HIGH (ref 24–36)

## 2017-11-11 LAB — PROCALCITONIN: PROCALCITONIN: 3.03 ng/mL

## 2017-11-11 LAB — PROTIME-INR
INR: 1.35
PROTHROMBIN TIME: 16.6 s — AB (ref 11.4–15.2)

## 2017-11-11 LAB — MRSA PCR SCREENING: MRSA BY PCR: NEGATIVE

## 2017-11-11 LAB — LACTIC ACID, PLASMA
LACTIC ACID, VENOUS: 0.8 mmol/L (ref 0.5–1.9)
Lactic Acid, Venous: 0.8 mmol/L (ref 0.5–1.9)

## 2017-11-11 MED ORDER — SODIUM CHLORIDE 0.9% FLUSH
10.0000 mL | INTRAVENOUS | Status: DC | PRN
  Administered 2017-11-13: 20 mL
  Administered 2017-11-14: 10 mL
  Filled 2017-11-11 (×2): qty 40

## 2017-11-11 MED ORDER — SODIUM CHLORIDE 0.9 % IV SOLN
Freq: Once | INTRAVENOUS | Status: AC
  Administered 2017-11-11: 16:00:00 via INTRAVENOUS

## 2017-11-11 MED ORDER — CEFEPIME HCL 2 G IJ SOLR
2.0000 g | Freq: Once | INTRAMUSCULAR | Status: AC
  Administered 2017-11-11: 2 g via INTRAVENOUS
  Filled 2017-11-11: qty 2

## 2017-11-11 MED ORDER — HYDROMORPHONE HCL 1 MG/ML IJ SOLN: 0.5000 mg | INTRAMUSCULAR | Status: DC | PRN

## 2017-11-11 MED ORDER — ONDANSETRON HCL 4 MG/2ML IJ SOLN: 4.0000 mg | Freq: Four times a day (QID) | INTRAMUSCULAR | Status: DC | PRN

## 2017-11-11 MED ORDER — SODIUM CHLORIDE 0.9 % IV SOLN
INTRAVENOUS | Status: AC
  Administered 2017-11-11: 21:00:00 via INTRAVENOUS

## 2017-11-11 MED ORDER — ACETAMINOPHEN 650 MG RE SUPP
650.0000 mg | Freq: Four times a day (QID) | RECTAL | Status: DC | PRN
Start: 2017-11-11 — End: 2017-11-14

## 2017-11-11 MED ORDER — IOPAMIDOL (ISOVUE-300) INJECTION 61%
INTRAVENOUS | Status: AC
  Administered 2017-11-11: 30 mL via ORAL
  Filled 2017-11-11: qty 30

## 2017-11-11 MED ORDER — ONDANSETRON HCL 4 MG PO TABS: 4.0000 mg | ORAL_TABLET | Freq: Four times a day (QID) | ORAL | Status: DC | PRN

## 2017-11-11 MED ORDER — VANCOMYCIN HCL 500 MG IV SOLR
500.0000 mg | Freq: Two times a day (BID) | INTRAVENOUS | Status: DC
  Administered 2017-11-12 (×2): 500 mg via INTRAVENOUS
  Filled 2017-11-11 (×3): qty 500

## 2017-11-11 MED ORDER — SODIUM CHLORIDE 0.9 % IV SOLN
1.0000 g | Freq: Three times a day (TID) | INTRAVENOUS | Status: DC
  Filled 2017-11-11 (×2): qty 1

## 2017-11-11 MED ORDER — VANCOMYCIN HCL 10 G IV SOLR
1250.0000 mg | Freq: Once | INTRAVENOUS | Status: AC
  Administered 2017-11-11: 1250 mg via INTRAVENOUS
  Filled 2017-11-11: qty 1250

## 2017-11-11 MED ORDER — IOPAMIDOL (ISOVUE-300) INJECTION 61%
INTRAVENOUS | Status: AC
  Administered 2017-11-11: 100 mL
  Filled 2017-11-11: qty 100

## 2017-11-11 MED ORDER — ACETAMINOPHEN 325 MG PO TABS
650.0000 mg | ORAL_TABLET | Freq: Four times a day (QID) | ORAL | Status: DC | PRN
  Administered 2017-11-11: 650 mg via ORAL
  Filled 2017-11-11: qty 2

## 2017-11-11 MED ORDER — POLYETHYLENE GLYCOL 3350 17 G PO PACK: 17.0000 g | PACK | Freq: Every day | ORAL | Status: DC | PRN

## 2017-11-11 MED ORDER — PIPERACILLIN-TAZOBACTAM 3.375 G IVPB
3.3750 g | Freq: Three times a day (TID) | INTRAVENOUS | Status: DC
  Administered 2017-11-11 – 2017-11-14 (×8): 3.375 g via INTRAVENOUS
  Filled 2017-11-11 (×8): qty 50

## 2017-11-11 NOTE — Progress Notes (Addendum)
Pharmacy Antibiotic Note  Crystal Carlson is a 52 y.o. female admitted on 11/11/2017 with sepsis.  Pharmacy has been consulted for vancomycin and cefepime dosing. Cefepime 2 gm given at 1648 WBC 3.3, Cr 0.77, wt 52 .g Plan: Cefepime 1 gm IV q8h Vancomycin 1250 mg IV loading dose followed by vancomycin 500 mg IV q12 for est AUC 441 using SCr 0.77 TBW/TBW F/u renal function, wbc, temp, culture data Vancomycin levels as needed  Height: 5\' 5"  (165.1 cm) Weight: 114 lb 10.2 oz (52 kg) IBW/kg (Calculated) : 57  Temp (24hrs), Avg:99.6 F (37.6 C), Min:99 F (37.2 C), Max:100.1 F (37.8 C)  Recent Labs  Lab 11/11/17 1450  WBC 3.3*  CREATININE 0.77    Estimated Creatinine Clearance: 69.8 mL/min (by C-G formula based on SCr of 0.77 mg/dL).    No Known Allergies  Thank you for allowing pharmacy to be a part of this patient's care.  Eudelia Bunch, Pharm.D. [phone] 11/11/2017 7:39 PM   Addendum: Cefepime changed to zosyn Zosyn 3.375 gm IV q8h, infuse each dose over 4 hours Daily SCr  Eudelia Bunch, Pharm.D. [phone] 11/11/2017 8:06 PM

## 2017-11-11 NOTE — Progress Notes (Signed)
Pt to be admitted to [facility] room # 1424. Report called to Erasmo Downer, Therapist, sports. IV antibiotic completed.  Port flushed/capped. Pt transferred to 1424 via w/c and pt's sister is with her.

## 2017-11-11 NOTE — Progress Notes (Signed)
[facility] Progress Note   Crystal Carlson [id number] 10/02/1965 52 y.o.  Crystal Carlson is managed by Dr. Ladell Carlson  Actively treated with chemotherapy: yes  Current Therapy: Gemcitabine and Abraxane  Last Treated: 10/17/2017 (cycle 1, day 1)  Assessment: Plan:    Fever, unspecified - Plan: 0.9 %  sodium chloride infusion, ceFEPIme (MAXIPIME) 2 g in sodium chloride 0.9 % 100 mL IVPB  Hyperbilirubinemia  Pancreatic cancer metastasized to liver [facility])   Fever: [name] was given cefepime 2 g IV x1.  Dr. Alfredia Carlson has graciously agreed to accept this patient for a direct admission to [facility].  Hyperbilirubinemia: The patient's labs from today returned showing a bilirubin of 2.3 that was up from 0.7 when last checked 8 days ago.  Additionally the patient's liver function tests showed that her AST is elevated at 151 and is up from 55 when last collected.  Her ALT was elevated at 94 and was up from 48 when last checked 8 days ago.  I discussed the need for imaging studies with Dr. Alfredia Carlson to evaluate the patency of the patient's biliary stent.  She is admitted to room 1424 at [facility].  Pancreatic cancer with metastatic disease to the liver: The patient is followed by Dr. Ladell Carlson and was last treated with cycle 1, day 1 of gemcitabine and Abraxane on 10/17/2017.  Please see After Visit Summary for patient specific instructions.  Future Appointments  Date Time Provider [place]  11/17/2017  9:00 AM [facility] ONLY [facility] None  11/17/2017  9:15 AM [facility] I27 DNS [facility] None  11/17/2017  9:45 AM Crystal Shark, NP [facility] None  11/17/2017 10:45 AM [facility] B7 [facility] None  11/17/2017 12:00 PM Crystal Carlson, RD [facility] None  12/01/2017  9:45 AM [facility] ONLY [facility] None  12/01/2017 10:00 AM [facility] INJ NURSE [facility] None  12/01/2017 10:30 AM Crystal Pier, MD [facility] None  12/01/2017 11:30 AM  [facility] J32 DNS [facility] None    No orders of the defined types were placed in this encounter.      Subjective:   Patient ID:  Crystal Carlson is a 52 y.o. (DOB November 15, 1965) female.  Chief Complaint:  Chief Complaint  Patient presents with  . Fever    HPI Crystal Carlson is a most pleasant 52 year old female with a history of a metastatic pancreatic cancer with metastatic disease to the liver.  She was originally diagnosed with a metastatic pancreatic cancer with a pancreatic neck mass with encasement of the hepatic, splenic, and superior mesenteric arteries with liver lesions in October 2018.  She was noted to have elevated liver enzymes and hyperbilirubinemia which were believed likely to an early manifestation of a biliary obstruction secondary to pancreatic cancer on 06/08/2017.  On 06/09/2017 she had an ERCP completed with placement of a common bile duct stent.  She was originally treated with FOLFIRINOX with her first cycle dose on 06/18/2017.  She completed cycle 5 on 08/24/2017 and presented to the office on 08/31/2017 with fevers of 101.9, light colored stools and dark urine.  She was admitted at that time.  A HIDA scan was completed which showed a normal hepatobiliary patency study.  She completed cycle 7 of FOLFIRINOX on 10/06/2017.  Cycle 8 was held for multiple reasons including concern for possible disease progression, thrombocytopenia, and a possible infection.  A CT scan of the abdomen and pelvis was completed on 10/25/2017 with findings concerning for enterocolitis.  No bowel obstruction was noted.  Also noted was an ill-defined pancreatic head mass consistent with the patient's known malignancy, multiple hepatic hypodense/hypoenhancing lesions which are concerning for metastatic disease, mild left hydronephrosis which is new, diffuse mesenteric edema and small ascites, and right paracolic metastatic nodules which had increased in size compared to her prior CT.  [name] was  transitioned to new chemotherapy of gemcitabine and Abraxane which was dosed on 11/04/2017.  She presents to the office today with a report of fevers for the last 24 hours and significant fatigue.  She has had fevers up to 101.8.  Her abdominal pain is controlled.  She denies any abdominal pain at this point.  She has not had anything to eat today.  She denies nausea, vomiting, or diarrhea.  She has ongoing constipation.  She denies any dark urine or light colored stools.  Labs completed today show a WBC of 3.3, hemoglobin 9.2, hematocrit 28.2, platelet count at 55, and ANC of 3.0.  Her chemistry panel returned with a sodium of 134, albumin of 3.0, AST at 151, ALT 94, alkaline phosphatase 563, and bilirubin of 2.3.  This information was discussed with Crystal Carlson who graciously agreed to accept this patient for direct admission to [facility].  Medications: I have reviewed the patient's current medications.  Allergies: No Known Allergies  Past Medical History:  Diagnosis Date  . Cancer of pancreas ([place]) 05/26/2017  . Chronic pancreatitis ([place]) 05/19/2017  . Microcytic anemia 05/19/2017  . Prediabetes 2016   when living in [place]    Past Surgical History:  Procedure Laterality Date  . ERCP N/A 06/09/2017   Procedure: ENDOSCOPIC RETROGRADE CHOLANGIOPANCREATOGRAPHY (ERCP);  Surgeon: Crystal Essex, MD;  Location: [facility];  Service: Endoscopy;  Laterality: N/A;  . IR FLUORO GUIDE PORT INSERTION RIGHT  06/04/2017  . IR US GUIDE VASC ACCESS RIGHT  06/04/2017    Family History  Problem Relation Age of Onset  . Hypertension Mother   . Arthritis Mother   . Diabetes Father   . Heart disease Father        ?valvular problems  . Hyperlipidemia Son   . Diabetes Brother     Social History   Socioeconomic History  . Marital status: Married    Spouse name: Not on file  . Number of children: 3  . Years of education: college grad--civics--states equivalent to High school here.    . Highest education level: Not on file  Occupational History  . Occupation: Leisure centre manager for an [employer]  . Financial resource strain: Not on file  . Food insecurity:    Worry: Not on file    Inability: Not on file  . Transportation needs:    Medical: Not on file    Non-medical: Not on file  Tobacco Use  . Smoking status: Never Smoker  . Smokeless tobacco: Never Used  Substance and Sexual Activity  . Alcohol use: No  . Drug use: No  . Sexual activity: Not on file  Lifestyle  . Physical activity:    Days per week: Not on file    Minutes per session: Not on file  . Stress: Not on file  Relationships  . Social connections:    Talks on phone: Not on file    Gets together: Not on file    Attends religious service: Not on file    Active member of club or organization: Not on file    Attends meetings  of clubs or organizations: Not on file    Relationship status: Not on file  . Intimate partner violence:    Fear of current or ex partner: Not on file    Emotionally abused: Not on file    Physically abused: Not on file    Forced sexual activity: Not on file  Other Topics Concern  . Not on file  Social History Narrative   Originally from [place]   Moved to [place] In 41   Husband came in 2000   Lives at home with husband, 3 children and mother and father in Sports coach.    Past Medical History, Surgical history, Social history, and Family history were reviewed and updated as appropriate.   Please see review of systems for further details on the patient's review from today.   Review of Systems:  Review of Systems  Constitutional: Positive for appetite change, chills, fatigue and fever. Negative for diaphoresis.  HENT: Negative for trouble swallowing.   Respiratory: Negative for cough, choking, chest tightness, shortness of breath and wheezing.   Cardiovascular: Negative for chest pain, palpitations and leg swelling.  Gastrointestinal: Positive for  constipation. Negative for abdominal pain, diarrhea, nausea and vomiting.  Genitourinary: Negative for dysuria.  Skin: Negative for color change.    Objective:   Physical Exam:  BP 131/81 (BP Location: Left Arm, Patient Position: Sitting)   Pulse (!) 113 Comment: RN Beth aware of pulse.  Temp 100.1 F (37.8 C) (Oral) Comment: RN Beth aware of temp.  Resp 19   Wt 117 lb (53.1 kg)   SpO2 99%   BMI 19.47 kg/m  ECOG: 2  Physical Exam  Constitutional: No distress.  HENT:  Head: Normocephalic and atraumatic.  Cardiovascular: S1 normal and S2 normal. Tachycardia present.  Pulmonary/Chest: Effort normal and breath sounds normal. No respiratory distress. She has no wheezes. She has no rales.  Abdominal: Soft. Bowel sounds are normal. She exhibits no distension. There is tenderness (diffuse bilateral upper abdominal pain). There is no rebound and no guarding.  Neurological: She is alert.  Skin: Skin is warm and dry. She is not diaphoretic.    Lab Review:     Component Value Date/Time   NA 134 (L) 11/11/2017 1450   NA 141 08/07/2017 1205   K 4.1 11/11/2017 1450   K 4.2 08/07/2017 1205   CL 101 11/11/2017 1450   CO2 22 11/11/2017 1450   CO2 25 08/07/2017 1205   GLUCOSE 129 11/11/2017 1450   GLUCOSE 114 08/07/2017 1205   BUN 9 11/11/2017 1450   BUN 9.6 08/07/2017 1205   CREATININE 0.77 11/11/2017 1450   CREATININE 0.8 08/07/2017 1205   CALCIUM 9.3 11/11/2017 1450   CALCIUM 9.1 08/07/2017 1205   PROT 7.1 11/11/2017 1450   PROT 6.7 08/07/2017 1205   ALBUMIN 3.0 (L) 11/11/2017 1450   ALBUMIN 3.0 (L) 08/07/2017 1205   AST 151 (H) 11/11/2017 1450   AST 58 (H) 08/07/2017 1205   ALT 94 (H) 11/11/2017 1450   ALT 59 (H) 08/07/2017 1205   ALKPHOS 536 (H) 11/11/2017 1450   ALKPHOS 520 (H) 08/07/2017 1205   BILITOT 2.3 (H) 11/11/2017 1450   BILITOT 0.42 08/07/2017 1205   GFRNONAA >60 11/11/2017 1450   GFRAA >60 11/11/2017 1450       Component Value Date/Time   WBC 3.3 (L)  11/11/2017 1450   WBC 8.1 10/25/2017 1701   RBC 3.33 (L) 11/11/2017 1450   HGB 9.8 (L) 10/25/2017 1701   HGB  9.5 (L) 08/07/2017 1205   HCT 28.2 (L) 11/11/2017 1450   HCT 30.0 (L) 08/07/2017 1205   PLT 55 (L) 11/11/2017 1450   PLT 138 (L) 08/07/2017 1205   PLT 117 (L) 05/08/2017 1557   MCV 84.7 11/11/2017 1450   MCV 84.5 08/07/2017 1205   MCH 27.7 11/11/2017 1450   MCHC 32.7 11/11/2017 1450   RDW 17.4 (H) 11/11/2017 1450   RDW 25.0 (H) 08/07/2017 1205   LYMPHSABS 0.1 (L) 11/11/2017 1450   LYMPHSABS 1.2 08/07/2017 1205   MONOABS 0.1 11/11/2017 1450   MONOABS 0.7 08/07/2017 1205   EOSABS 0.0 11/11/2017 1450   EOSABS 0.0 08/07/2017 1205   EOSABS 0.2 05/08/2017 1557   BASOSABS 0.0 11/11/2017 1450   BASOSABS 0.1 08/07/2017 1205   -------------------------------  Imaging from last 24 hours (if applicable):  Radiology interpretation: Dg Chest 2 View  Result Date: 10/25/2017 CLINICAL DATA:  Fever for the past 2 days. History of pancreatic cancer. EXAM: CHEST - 2 VIEW COMPARISON:  Chest x-ray dated August 31, 2017. FINDINGS: Unchanged right chest wall port catheter with the tip in the SVC. The heart size and mediastinal contours are within normal limits. Normal pulmonary vascularity. No focal consolidation, pleural effusion, or pneumothorax. No acute osseous abnormality. Unchanged common bile duct stent. IMPRESSION: No active cardiopulmonary disease. Electronically Signed   By: Titus Dubin M.D.   On: 10/25/2017 16:39   Ct Abdomen Pelvis W Contrast  Result Date: 10/25/2017 CLINICAL DATA:  52 year old female with stage IV pancreatic cancer and high fever abdominal pain. Concern for abscess. EXAM: CT ABDOMEN AND PELVIS WITH CONTRAST TECHNIQUE: Multidetector CT imaging of the abdomen and pelvis was performed using the standard protocol following bolus administration of intravenous contrast. CONTRAST:  115mL ISOVUE-300 IOPAMIDOL (ISOVUE-300) INJECTION 61% COMPARISON:  Abdominal CT dated  09/03/2017 FINDINGS: Lower chest: Bibasilar linear atelectasis/scarring. Probable trace pericardial effusion. No intra-abdominal free air. There is diffuse mesenteric edema and small ascites. Hepatobiliary: There are multiple hypoenhancing liver lesions with the largest measuring 1.9 x 2.0 cm (previously 1.7 x 1.7 cm) segment IVA. This lesion is not well characterized but concerning for metastatic disease. A 9 x 10 mm hypoenhancing lesion in the inferior right lobe of the liver (series 2, image 35) appears slightly larger compared to prior CT and is suspicious for metastatic disease. Faint areas of heterogeneous and decreased enhancement in the central liver. There is pneumobilia. A wall stent is in place and appears patent. There is air within the gallbladder. No calcified stone. Pancreas: Ill-defined hypoenhancing pancreatic head mass again noted. The region of the head of the pancreas measure approximately 5.6 x 4.8 cm on series 2, image 28). There is atrophy of the pancreas with dilatation of the main pancreatic duct. There is infiltration of the peripancreatic fat. Spleen: The spleen is enlarged measuring approximately 18 cm in length. Adrenals/Urinary Tract: The adrenal glands are grossly unremarkable as visualized. Bilateral extrarenal pelvis. There is mild left hydronephrosis, new compared to the prior CT. The urinary bladder is distended and grossly unremarkable. Stomach/Bowel: There is extensive inflammatory changes and edema of the duodenal C-loop as well as thickening of the jejunal folds. There is thickened and edematous appearance of the colon primarily involving the proximal portion. Although this appearance may be related to ascites, findings concerning for enterocolitis. Correlation with clinical exam recommended. There is no bowel obstruction. The appendix is normal. Vascular/Lymphatic: The abdominal aorta and IVC appear unremarkable. The main portal vein is not visualized. There is cavernous  transformation in the region of the porta hepaticus. There is infiltration of the fat surrounding the celiac axis, SMA, and IMA. Reproductive: The uterus and ovaries are grossly unremarkable. Other: There is a 1.6 x 2.4 cm enhancing lesion in the right paracolic gutter which has increased in size compared to the prior CT (previously measuring 13 x 13 mm) most consistent with metastatic disease. Musculoskeletal: No acute osseous pathology. IMPRESSION: 1. Findings concerning for enterocolitis. Clinical correlation is recommended. No bowel obstruction. Normal appendix. 2. Ill-defined pancreatic head mass consistent with known malignancy. 3. Multiple hepatic hypodense/hypoenhancing lesions which are not characterized with certainty on this CT but are concerning for metastatic disease. 4. Right paracolic metastatic nodule with increased size compared to prior CT. 5. Mild left hydronephrosis, new since the prior CT. 6. Diffuse mesenteric edema and small ascites. 7. No drainable fluid collection or abscess as clinically concerned. 8. Splenomegaly. Electronically Signed   By: Anner Crete M.D.   On: 10/25/2017 19:06

## 2017-11-11 NOTE — H&P (Signed)
Crystal Carlson BPZ:[id number] DOB: 05-Apr-1966 DOA: 11/11/2017     PCP: Mack Hook, MD   Outpatient Specialists:     Oncology  Dr. Benay Spice Patient arrived to ER on  at   Patient coming from:   home Lives   With family    Chief Complaint: fever HPI: Crystal Carlson is a 52 y.o. 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 Reports super pubic pain. No diarrhea denies any cough no sick contacts. Denies any easy bleeding Her fever was high as 101.8 no associated nausea vomiting and diarrhea no light stools or dark urine but noted to have an elevated LFTs are from baseline bilirubin now up to 2.3.  Hospitalist was called for direct admission from cancer center.  While the cancer center she had blood cultures obtained and started on cefepime  Last chemotherapy was 04 November 2017 Regarding pertinent Chronic problems: Diagnosed with pancreatic cancer since October 2018 she was noted to have elevated LFTs in October had ERCP and placement of common bile duct stent treated with chemotherapy in January had developed a fever and light colored stool HIDA scan at that time showed normal hepatobiliary patency. 10 for march CT scan of her abdomen and pelvis showed enterocolitis and mild left hydronephrosis with diffuse mesenteric edema and small ascites. She was started on new chemotherapy dosed on 20 March       Na  134 K  4.1  Cr   stable,   Lab Results  Component Value Date   CREATININE 0.77 11/11/2017   CREATININE 0.73 11/03/2017   CREATININE 0.69 10/27/2017      WBC 3.3 ANC 3.0  HG/HCT  stable,      Component Value Date/Time   HGB 9.8 (L) 10/25/2017 1701   HGB 9.5 (L) 08/07/2017 1205   HCT 28.2 (L) 11/11/2017 1450   HCT 30.0 (L) 08/07/2017 1205      Lactic Acid, Venous    Component Value Date/Time   LATICACIDVEN 0.81 10/25/2017  1810      UA ordered  ECG:  Not obtained    ED Triage Vitals  Enc Vitals Group     BP 11/11/17 1754 130/79     Pulse Rate 11/11/17 1754 91     Resp 11/11/17 1754 16     Temp 11/11/17 1754 99 F (37.2 C)     Temp Source 11/11/17 1754 Oral     SpO2 11/11/17 1754 100 %     Weight 11/11/17 1754 114 lb 10.2 oz (52 kg)     Height 11/11/17 1754 5\' 5"  (1.651 m)     Head Circumference --      Peak Flow --      Pain Score 11/11/17 1823 0     Pain Loc --      Pain Edu? --      Excl. in [place]? --   TMAX(24)@     on arrival  ED Triage Vitals  Enc Vitals Group     BP 11/11/17 1754 130/79     Pulse Rate 11/11/17 1754 91     Resp 11/11/17 1754 16     Temp 11/11/17 1754 99 F (37.2 C)     Temp Source 11/11/17 1754 Oral     SpO2 11/11/17 1754 100 %     Weight 11/11/17 1754 114 lb 10.2 oz (52  kg)     Height 11/11/17 1754 5\' 5"  (1.651 m)     Head Circumference --      Peak Flow --      Pain Score 11/11/17 1823 0     Pain Loc --      Pain Edu? --      Excl. in [place]? --      Latest  Blood pressure 130/79, pulse 91, temperature 99 F (37.2 C), temperature source Oral, resp. rate 16, height 5\' 5"  (1.651 m), weight 52 kg (114 lb 10.2 oz), SpO2 100 %.   Following Medications were ordered in ER: Medications - No data to display    Hospitalist was called for admission for fever and elevated LFTs   Review of Systems:    Pertinent positives include: Fevers, chills, fatigue,  Constitutional:  No weight loss, night sweats,  weight loss  HEENT:  No headaches, Difficulty swallowing,Tooth/dental problems,Sore throat,  No sneezing, itching, ear ache, nasal congestion, post nasal drip,  Cardio-vascular:  No chest pain, Orthopnea, PND, anasarca, dizziness, palpitations.no Bilateral lower extremity swelling  GI:  No heartburn, indigestion, abdominal pain, nausea, vomiting, diarrhea, change in bowel habits, loss of appetite, melena, blood in stool, hematemesis Resp:  no shortness of breath  at rest. No dyspnea on exertion, No excess mucus, no productive cough, No non-productive cough, No coughing up of blood.No change in color of mucus.No wheezing. Skin:  no rash or lesions. No jaundice GU:  no dysuria, change in color of urine, no urgency or frequency. No straining to urinate.  No flank pain.  Musculoskeletal:  No joint pain or no joint swelling. No decreased range of motion. No back pain.  Psych:  No change in mood or affect. No depression or anxiety. No memory loss.  Neuro: no localizing neurological complaints, no tingling, no weakness, no double vision, no gait abnormality, no slurred speech, no confusion  As per HPI otherwise 10 point review of systems negative.   Past Medical History:   Past Medical History:  Diagnosis Date  . Cancer of pancreas ([place]) 05/26/2017  . Chronic pancreatitis ([place]) 05/19/2017  . Microcytic anemia 05/19/2017  . Prediabetes 2016   when living in [place]      Past Surgical History:  Procedure Laterality Date  . ERCP N/A 06/09/2017   Procedure: ENDOSCOPIC RETROGRADE CHOLANGIOPANCREATOGRAPHY (ERCP);  Surgeon: Clarene Essex, MD;  Location: [facility];  Service: Endoscopy;  Laterality: N/A;  . IR FLUORO GUIDE PORT INSERTION RIGHT  06/04/2017  . IR US GUIDE VASC ACCESS RIGHT  06/04/2017    Social History:  Ambulatory  independently      reports that she has never smoked. She has never used smokeless tobacco. She reports that she does not drink alcohol or use drugs.     Family History:   Family History  Problem Relation Age of Onset  . Hypertension Mother   . Arthritis Mother   . Diabetes Father   . Heart disease Father        ?valvular problems  . Hyperlipidemia Son   . Diabetes Brother     Allergies: No Known Allergies   Prior to Admission medications   Medication Sig Start Date End Date Taking? Authorizing Provider  acetaminophen (TYLENOL) 325 MG tablet Take 650 mg by mouth every 6 (six) hours as needed.     [provider]  cetirizine (ZYRTEC) 10 MG tablet Take 10 mg by mouth See admin instructions. Takes for 3 days follow chemotherapy  [provider]  dexamethasone (DECADRON) 4 MG tablet TK 1 T PO BID FOR 3 DAYS AFTER EACH CYCLE OF CHEMO 09/15/17   Ladell Pier, MD  ferrous sulfate 325 (65 FE) MG EC tablet Take 1 tablet (325 mg total) by mouth 3 (three) times daily with meals. 06/18/17   Ladell Pier, MD  fluconazole (DIFLUCAN) 100 MG tablet Take 1 tablet (100 mg total) by mouth daily. 10/06/17   Owens Shark, NP  hydrocortisone (ANUSOL-HC) 2.5 % rectal cream Apply 1 application topically 4 (four) times daily as needed for hemorrhoids. 07/27/17   Theodis Blaze, MD  hydrocortisone cream 1 % Apply 1 application topically 3 (three) times daily as needed for itching (minor skin irritation). 07/27/17   Theodis Blaze, MD  HYDROmorphone (DILAUDID) 4 MG tablet Take 1-2 tablets (4-8 mg total) by mouth every 4 (four) hours as needed for severe pain. 10/27/17   Owens Shark, NP  lidocaine-prilocaine (EMLA) cream Apply to port site one hour prior to use. Do not rub in. Cover with plastic. Patient taking differently: Apply 1 application daily as needed topically. Apply to port site one hour prior to use. Do not rub in. Cover with plastic. 06/03/17   Ladell Pier, MD  loperamide (IMODIUM) 2 MG capsule Take 1 capsule (2 mg total) by mouth as needed for diarrhea or loose stools (max 8 tabs per day). 08/12/17   Owens Shark, NP  LORazepam (ATIVAN) 0.5 MG tablet Take 1 tablet (0.5 mg total) by mouth every 8 (eight) hours as needed for anxiety (or nausea). 07/16/17   Owens Shark, NP  metoCLOPramide (REGLAN) 5 MG tablet Take 1 tablet (5 mg total) by mouth 3 (three) times daily before meals. 08/24/17   Owens Shark, NP  ondansetron (ZOFRAN ODT) 4 MG disintegrating tablet Take 1 tablet (4 mg total) by mouth every 8 (eight) hours as needed for nausea or vomiting. 10/25/17   Khatri, Hina,  PA-C  potassium chloride SA (K-DUR,KLOR-CON) 20 MEQ tablet Take 1 tablet (20 mEq total) by mouth daily. 10/27/17   Owens Shark, NP  prochlorperazine (COMPAZINE) 10 MG tablet Take 1 tablet (10 mg total) by mouth every 6 (six) hours as needed for nausea or vomiting. Patient not taking: Reported on 10/27/2017 07/07/17   Ladell Pier, MD   Physical Exam: Blood pressure 130/79, pulse 91, temperature 99 F (37.2 C), temperature source Oral, resp. rate 16, height 5\' 5"  (1.651 m), weight 52 kg (114 lb 10.2 oz), SpO2 100 %. 1. General:  in No Acute distress  Chronically ill -appearing 2. Psychological: Alert and   Oriented 3. Head/ENT:     Dry Mucous Membranes                          Head Non traumatic, neck supple                           Poor Dentition 4. SKIN:   decreased Skin turgor,  Skin clean Dry and intact no rash 5. Heart: Regular rate and rhythm no Murmur, no Rub or gallop 6. Lungs: Clear to auscultation bilaterally, no wheezes or crackles   7. Abdomen: Soft,suprapubic tender, Non distended  bowel sounds present 8. Lower extremities: no clubbing, cyanosis, or edema 9. Neurologically Grossly intact, moving all 4 extremities equally  10. MSK: Normal range of motion   LABS:  Recent Labs  Lab 11/11/17 1450  WBC 3.3*  NEUTROABS 3.0  HCT 28.2*  MCV 84.7  PLT 55*   Basic Metabolic Panel: Recent Labs  Lab 11/11/17 1450  NA 134*  K 4.1  CL 101  CO2 22  GLUCOSE 129  BUN 9  CREATININE 0.77  CALCIUM 9.3      Recent Labs  Lab 11/11/17 1450  AST 151*  ALT 94*  ALKPHOS 536*  BILITOT 2.3*  PROT 7.1  ALBUMIN 3.0*   No results for input(s): LIPASE, AMYLASE in the last 168 hours. No results for input(s): AMMONIA in the last 168 hours.    HbA1C: No results for input(s): HGBA1C in the last 72 hours. CBG: No results for input(s): GLUCAP in the last 168 hours.    Urine analysis:    Component Value Date/Time   COLORURINE AMBER (A) 11/11/2017 1451    APPEARANCEUR CLEAR 11/11/2017 1451   LABSPEC 1.014 11/11/2017 1451   LABSPEC 1.020 07/24/2017 0847   PHURINE 7.0 11/11/2017 1451   GLUCOSEU NEGATIVE 11/11/2017 1451   GLUCOSEU Negative 07/24/2017 0847   HGBUR SMALL (A) 11/11/2017 1451   BILIRUBINUR NEGATIVE 11/11/2017 1451   BILIRUBINUR Negative 07/24/2017 0847   KETONESUR NEGATIVE 11/11/2017 1451   PROTEINUR NEGATIVE 11/11/2017 1451   UROBILINOGEN 0.2 07/24/2017 0847   NITRITE NEGATIVE 11/11/2017 1451   LEUKOCYTESUR NEGATIVE 11/11/2017 1451   LEUKOCYTESUR Negative 07/24/2017 0847      Cultures:    Component Value Date/Time   SDES  11/11/2017 1550    PORTA CATH Performed at [facility], [place] [address] [place], [place] [address]    SPECREQUEST  11/11/2017 1550    BOTTLES DRAWN AEROBIC AND ANAEROBIC Blood Culture adequate volume   CULT PENDING 11/11/2017 1550   REPTSTATUS PENDING 11/11/2017 1550     Radiological Exams on Admission: No results found.  Chart has been reviewed    Assessment/Plan  52 y.o. 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 Sepsis ([place]) -unclear etiology will obtain chest x-ray, UA, await results of blood cultures check serial lactic acid check pro-calcitonin given elevated LFTs and possibility of intra-abdominal infection we will switch to Zosyn given history of port placement we will add Vanco check a MRSA pCR Check for viral etiology Obtain CT of her abdomen to evaluate for intra-abdominal source of infection . Protein-calorie malnutrition, moderate (HCC) stable to tolerate p.o.'s will need nutritional consult . Prediabetes states have not been requiring check any blood sugars at home check hemoglobin A1c tomorrow . Pancreatic cancer metastasized to liver [facility]) oncology aware of admission would appreciate the input regarding overall goals of care . HTN (hypertension)  not on any home medication continue to monitor . Elevated LFTs -continue to trend, discussed with GI if any evidence of stent obstruction will need repeat ERCP for now make n.p.o. until CT of abdomen has been done and n.p.o. postmidnight . Chronic pain due to malignant neoplastic disease -continue PRN IV pain medication while n.p.o. . Thrombocytopenia ([place])  -possibly chemotherapy-induced no evidence of bleeding   Other plan as per orders.  DVT prophylaxis:  SCD    Code Status:  FULL CODE as per patient   Family Communication:   Family   at  Bedside  plan of care was discussed with  Sister,    Disposition Plan:    To home once workup is complete and patient is  stable                                           Consults called: GI Dr. Raynelle Dick oncology  Admission status:  inpatient     Level of care     tele      I have spent a total of 35min on this admission   extra time was spent to discuss case with consultants  Crystal Carlson 11/11/2017, 8:05 PM    [facility]  Pager [phone]   after 2 AM please page floor coverage PA If 7AM-7PM, please contact the day team taking care of the patient  [email or web address]  Password [email or web address]

## 2017-11-12 ENCOUNTER — Other Ambulatory Visit: Payer: Self-pay

## 2017-11-12 DIAGNOSIS — D6959 Other secondary thrombocytopenia: Secondary | ICD-10-CM

## 2017-11-12 DIAGNOSIS — D638 Anemia in other chronic diseases classified elsewhere: Secondary | ICD-10-CM

## 2017-11-12 DIAGNOSIS — R21 Rash and other nonspecific skin eruption: Secondary | ICD-10-CM

## 2017-11-12 LAB — COMPREHENSIVE METABOLIC PANEL
ALK PHOS: 382 U/L — AB (ref 38–126)
ALT: 64 U/L — ABNORMAL HIGH (ref 14–54)
AST: 91 U/L — ABNORMAL HIGH (ref 15–41)
Albumin: 2.7 g/dL — ABNORMAL LOW (ref 3.5–5.0)
Anion gap: 9 (ref 5–15)
BILIRUBIN TOTAL: 1.8 mg/dL — AB (ref 0.3–1.2)
BUN: 10 mg/dL (ref 6–20)
CALCIUM: 8.5 mg/dL — AB (ref 8.9–10.3)
CO2: 24 mmol/L (ref 22–32)
Chloride: 105 mmol/L (ref 101–111)
Creatinine, Ser: 0.62 mg/dL (ref 0.44–1.00)
GFR calc non Af Amer: >60 mL/min (ref >60)
Glucose, Bld: 98 mg/dL (ref 65–99)
Potassium: 3.6 mmol/L (ref 3.5–5.1)
Sodium: 138 mmol/L (ref 135–145)
TOTAL PROTEIN: 6.2 g/dL — AB (ref 6.5–8.1)

## 2017-11-12 LAB — URINALYSIS, ROUTINE W REFLEX MICROSCOPIC
BILIRUBIN URINE: NEGATIVE
GLUCOSE, UA: NEGATIVE mg/dL
KETONES UR: 20 mg/dL — AB
LEUKOCYTES UA: NEGATIVE
Nitrite: NEGATIVE
PROTEIN: NEGATIVE mg/dL
Specific Gravity, Urine: 1.03 (ref 1.005–1.030)
pH: 5 (ref 5.0–8.0)

## 2017-11-12 LAB — T4, FREE: FREE T4: 1.16 ng/dL — AB (ref 0.61–1.12)

## 2017-11-12 LAB — RESPIRATORY PANEL BY PCR
ADENOVIRUS-RVPPCR: NOT DETECTED
Bordetella pertussis: NOT DETECTED
CORONAVIRUS HKU1-RVPPCR: NOT DETECTED
CORONAVIRUS NL63-RVPPCR: NOT DETECTED
CORONAVIRUS OC43-RVPPCR: NOT DETECTED
Chlamydophila pneumoniae: NOT DETECTED
Coronavirus 229E: NOT DETECTED
INFLUENZA A-RVPPCR: NOT DETECTED
Influenza B: NOT DETECTED
MYCOPLASMA PNEUMONIAE-RVPPCR: NOT DETECTED
Metapneumovirus: NOT DETECTED
PARAINFLUENZA VIRUS 1-RVPPCR: NOT DETECTED
PARAINFLUENZA VIRUS 4-RVPPCR: NOT DETECTED
Parainfluenza Virus 2: NOT DETECTED
Parainfluenza Virus 3: NOT DETECTED
Respiratory Syncytial Virus: NOT DETECTED
Rhinovirus / Enterovirus: NOT DETECTED

## 2017-11-12 LAB — CBC
HCT: 25.7 % — ABNORMAL LOW (ref 36.0–46.0)
HEMOGLOBIN: 8.1 g/dL — AB (ref 12.0–15.0)
MCH: 28.1 pg (ref 26.0–34.0)
MCHC: 31.5 g/dL (ref 30.0–36.0)
MCV: 89.2 fL (ref 78.0–100.0)
PLATELETS: 44 10*3/uL — AB (ref 150–400)
RBC: 2.88 MIL/uL — ABNORMAL LOW (ref 3.87–5.11)
RDW: 17.2 % — ABNORMAL HIGH (ref 11.5–15.5)
WBC: 2.6 10*3/uL — ABNORMAL LOW (ref 4.0–10.5)

## 2017-11-12 LAB — URINE CULTURE: Culture: NO GROWTH

## 2017-11-12 LAB — MAGNESIUM: MAGNESIUM: 1.8 mg/dL (ref 1.7–2.4)

## 2017-11-12 LAB — HEMOGLOBIN A1C
Hgb A1c MFr Bld: 5.4 % (ref 4.8–5.6)
Mean Plasma Glucose: 108.28 mg/dL

## 2017-11-12 LAB — TSH: TSH: 0.314 u[IU]/mL — ABNORMAL LOW (ref 0.350–4.500)

## 2017-11-12 LAB — PHOSPHORUS: Phosphorus: 3.3 mg/dL (ref 2.5–4.6)

## 2017-11-12 MED ORDER — MORPHINE SULFATE ER 15 MG PO TBCR
15.0000 mg | EXTENDED_RELEASE_TABLET | Freq: Two times a day (BID) | ORAL | Status: DC
  Administered 2017-11-12 – 2017-11-14 (×5): 15 mg via ORAL
  Filled 2017-11-12 (×5): qty 1

## 2017-11-12 MED ORDER — SODIUM CHLORIDE 0.9 % IV SOLN
INTRAVENOUS | Status: DC
  Administered 2017-11-12 – 2017-11-13 (×2): via INTRAVENOUS

## 2017-11-12 MED ORDER — LORAZEPAM 0.5 MG PO TABS
0.5000 mg | ORAL_TABLET | Freq: Three times a day (TID) | ORAL | Status: DC | PRN
Start: 2017-11-12 — End: 2017-11-14
  Administered 2017-11-12 (×2): 0.5 mg via ORAL
  Filled 2017-11-12 (×2): qty 1

## 2017-11-12 NOTE — Progress Notes (Signed)
IP PROGRESS NOTE  Subjective:   Crystal Carlson returns as scheduled.  She completed a first treatment with gemcitabine/Abraxane on 11/04/2017. She reports developing a rash at the upper chest approximately 5 days after completing chemotherapy.  The rash has improved and was pruritic.  She presented yesterday with a high fever and chills.  She was admitted for further evaluation. She denies diarrhea. Objective: Vital signs in last 24 hours: Blood pressure 121/83, pulse 94, temperature 100.1 F (37.8 C), temperature source Oral, resp. rate 18, height 5\' 5"  (1.651 m), weight 114 lb 10.2 oz (52 kg), SpO2 99 %.  Intake/Output from previous day: 03/27 0701 - 03/28 0700 In: 91.3 [I.V.:91.3] Out: -   Physical Exam:  HEENT: No thrush or ulcers Lungs: Clear bilaterally Cardiac: Regular rate and rhythm Abdomen: No hepatosplenomegaly, no mass, nontender Extremities: No leg edema Skin: Erythematous maculopapular rash at the mid upper anterior chest, anterior/posterior waistline, and at the ankle area bilaterally  Portacath/PICC-without erythema  Lab Results: Recent Labs    11/11/17 1450 11/12/17 0427  WBC 3.3* 2.6*  HGB  --  8.1*  HCT 28.2* 25.7*  PLT 55* 44*  11/11/2017: ANC 3.0  BMET Recent Labs    11/11/17 1450 11/12/17 0427  NA 134* 138  K 4.1 3.6  CL 101 105  CO2 22 24  GLUCOSE 129 98  BUN 9 10  CREATININE 0.77 0.62  CALCIUM 9.3 8.5*    Lab Results  Component Value Date   CEA1 1.3 05/25/2017    Studies/Results: Dg Chest 2 View  Result Date: 11/11/2017 CLINICAL DATA:  Initial evaluation for acute fever, weakness. History of pancreatic cancer. EXAM: CHEST - 2 VIEW COMPARISON:  Prior radiograph from 10/25/2017. FINDINGS: Right-sided Port-A-Cath in place, stable. Cardiac and mediastinal silhouettes are stable in size and contour, and remain within normal limits. Lungs mildly hypoinflated. Mild left basilar scarring, similar to previous. There is diffusely increased  prominence of the interstitial markings as compared to previous, which may reflect mild interstitial congestion or possibly atypical infection. No consolidative opacity to suggest alveolar pneumonia. No frank pulmonary edema. No pleural effusion. No pneumothorax. No acute osseus abnormality. Metallic biliary stent overlies the right upper quadrant. IMPRESSION: 1. Mild diffuse increased prominence of the interstitial markings, which may reflect mild interstitial congestion or possibly atypical infection. 2. No consolidative opacity to suggest pneumonia. Electronically Signed   By: Jeannine Boga M.D.   On: 11/11/2017 23:05   Ct Abdomen Pelvis W Contrast  Result Date: 11/11/2017 CLINICAL DATA:  52 year old female with history of metastatic pancreatic cancer. EXAM: CT ABDOMEN AND PELVIS WITH CONTRAST TECHNIQUE: Multidetector CT imaging of the abdomen and pelvis was performed using the standard protocol following bolus administration of intravenous contrast. CONTRAST:  166mL ISOVUE-300 IOPAMIDOL (ISOVUE-300) INJECTION 61%, 56mL ISOVUE-300 IOPAMIDOL (ISOVUE-300) INJECTION 61% COMPARISON:  Abdominal CT dated 10/25/2017 FINDINGS: Lower chest: Minimal bibasilar dependent atelectatic changes. The visualized lung bases are otherwise clear. The tip of a central venous line is visualized in the right atrium. No intra-abdominal free air.  There is a small amount of ascites. Hepatobiliary: Multiple hepatic hypodense lesions with the largest lesion measuring 2.4 x 1.8 cm in segment IVA/VIII as seen previously consistent with metastatic disease. There is pneumobilia. A Wallstent is noted in the CBD. Pancreas: Ill-defined infiltrative mass in the head of the pancreas measures 6.3 x 4.3 cm. There is atrophy of the body and tail of the pancreas with dilatation of the main pancreatic duct. Spleen: Mild splenomegaly measuring up  to 17 cm in length. Adrenals/Urinary Tract: The adrenal glands are unremarkable. There is mild left  hydronephrosis similar to prior CT. The right kidney is unremarkable. The urinary bladder is partially distended and appears unremarkable. Stomach/Bowel: There is inflammatory changes and thickening of multiple jejunal loops as well as mild diffuse thickening of the ileal loops and terminal ileum. There is inflammatory changes of the proximal half of the colon. Findings most consistent with an enterocolitis. Clinical correlation is recommended. There is no bowel obstruction. The appendix is normal. Vascular/Lymphatic: The abdominal aorta and IVC appear unremarkable. There is encasement of the celiac axis and proximal SMA by the infiltrative pancreatic tumor. 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 A 13 x 28 mm heterogeneously enhancing nodule along the right paracolic gutter consistent with metastatic disease, appears slightly larger compared to prior CT. Reproductive: The uterus is anteverted and grossly unremarkable. Other: None Musculoskeletal: Left sacral and small left femoral head sclerotic foci most consistent with bone island. No acute osseous pathology. Sclerotic changes of posterior element of T12 similar to prior CTs. IMPRESSION: 1. Findings most consistent with enterocolitis. Correlation with clinical exam recommended. No bowel obstruction. Normal appendix. 2. Infiltrative pancreatic head mass consistent with known malignancy. Multiple hepatic metastatic disease not significantly changed since the prior CT. 3. Slight interval increase in the size of the right paracolic metastatic nodule. 4. Mild left hydronephrosis similar to prior CT. 5. Diffuse mesenteric edema and small ascites. Electronically Signed   By: Anner Crete M.D.   On: 11/11/2017 23:24    Medications: I have reviewed the patient's current medications.  Assessment/Plan:  1. 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 Adenocarcinoma consistent with pancreatobiliary primary.  Cycle 1 FOLFIRINOX 06/18/2017  Cycle 2FOLFIRINOX 07/02/2017  Cycle 3 FOLFIRINOX 07/16/2017 (oxaliplatin dose reduced due to mild thrombocytopenia)  CT 07/24/2017-new splenomegaly and left hydronephrosis  Cycle 4 FOLFIRINOX 08/08/2017  Cycle 5 FOLFIRINOX 08/24/2017  CT abdomen/pelvis 09/03/2017-stable liver lesions and pancreas mass, stable right biliary dilatation, possible new 8 mm right liver lesion versus a vascular phenomena, lesion at the right paracolic gutter was present in the past-significance unclear  Cycle 6 FOLFIRINOX1/29/2019  Cycle7 FOLFIRINOX2/19/2019 (Irinotecan andOxaliplatin dose reduced due to thrombocytopenia)  CT abdomen/pelvis 10/25/2017- findings concerning for enterocolitis. No bowel obstruction. Ill-defined pancreatic head mass. Multiple hepatic hypoenhancing lesions with the largest measuring 1.9 x 2.0 cm, previously 1.7 x 1.7 cm; 9 x 10 mm hypoenhancing lesion in the inferior right lobe of the liver slightly larger compared to the previous CT; 1.6 x 2.4 cm enhancing lesion in the right paracolic gutter increased in size compared to the prior CT.  Cycle 1 gemcitabine/Abraxane 11/04/2017  2. Pain secondary to #1.Improved.  3. Weight losssecondary to #1.  4. Microcytic anemia  Transfused with 1 unit of packed red blood cells 07/25/2017  5. Thrombocytopenia secondary to chemotherapy  6. Port-A-Cath placement 06/04/2017  7. Elevated liver enzymes/bilirubin10/22/2018-likely early biliary  obstruction secondary to pancreas cancer, status post ERCP-placement of a common bile duct stent 06/09/2018  8.Postprandial abdominal pain. Protonix initiated 07/16/2017.Trial of Reglan initiated 08/24/2017.  9.Admission with fever 07/24/2017-no source  for infection identified, resolved   10.Diarrheafollowing cycle 3 FOLFIRINOX-resolved  11.Left hydronephrosis noted on CT12/03/2017-etiology unclear, urology consulted-stent not recommended.Mild left hydronephrosis noted on CT 10/25/2017. Creatinine normal 10/27/2017.  12. Admission 08/31/2017 with a high fever, blood culture positive for gram-negative rods-Klebsiella pneumoniae identified  13. Anemia secondary to chemotherapy, chronic disease, and sepsis-status post transfusion with packed red blood cells 09/02/2017  14.Fever 10/25/2017 status post evaluation in the emergency department. CT with findings concerning for enterocolitis. Currently on ciprofloxacin.   15.  Admission 11/11/2017 with a high fever-etiology unclear  16.  Rash-potentially secondary to gemcitabine or infection  17.  Pancytopenia secondary to chemotherapy  Crystal Carlson was admitted with a high fever.  There is no clear source for infection.  The differential diagnosis includes bacteremia related to the pancreas mass and bile duct stent.  She has pancytopenia secondary to chemotherapy.  The anemia is also secondary to chronic disease.  The rash may be related to gemcitabine.  Recommendations: 1.  Daily CBC/differential 2.  Continue broad-spectrum intravenous antibiotics and follow-up cultures 3.  Chemistry panel 11/05/2017. 4.  Outpatient follow-up at the Cancer center as scheduled on 11/18/2017.       LOS: 1 day   Betsy Coder, MD   11/12/2017, 7:24 AM

## 2017-11-12 NOTE — Consult Note (Signed)
Referring Provider:  Dr. Maylene Roes Primary Care Physician:  Mack Hook, MD Primary Gastroenterologist:  Dr. Alessandra Bevels  Reason for Consultation:  Abnormal LFTs, history of metastatic pancreatic cancer  HPI: Crystal Carlson is a 52 y.o. female with past medical history of metastatic pancreatic cancer diagnosed in October 2018 currently followed by Dr. Annitta Jersey, history of ERCP with fully covered metal stent placement on 06/09/2017. Patient was sent to [facility] from oncology office for further evaluation of fever as well as abnormal LFTs.  Patient seen and examined at bedside. Family at bedside. Patient's main concerns are chills/fver. She denied any nausea or vomiting. Denied any diarrhea. Complaining of generalized abdominal discomfort.last chemotherapy around 7-10 days ago. Denied any blood in the stool or black stool.  Past Medical History:  Diagnosis Date  . Cancer of pancreas ([place]) 05/26/2017  . Chronic pancreatitis ([place]) 05/19/2017  . Microcytic anemia 05/19/2017  . Prediabetes 2016   when living in [place]    Past Surgical History:  Procedure Laterality Date  . ERCP N/A 06/09/2017   Procedure: ENDOSCOPIC RETROGRADE CHOLANGIOPANCREATOGRAPHY (ERCP);  Surgeon: Clarene Essex, MD;  Location: [place];  Service: Endoscopy;  Laterality: N/A;  . IR FLUORO GUIDE PORT INSERTION RIGHT  06/04/2017  . IR US GUIDE VASC ACCESS RIGHT  06/04/2017    Prior to Admission medications   Medication Sig Start Date End Date Taking? Authorizing Provider  acetaminophen (TYLENOL) 325 MG tablet Take 650 mg by mouth every 6 (six) hours as needed.   Yes [provider]  cetirizine (ZYRTEC) 10 MG tablet Take 10 mg by mouth See admin instructions. Takes for 3 days follow chemotherapy    Yes [provider]  dexamethasone (DECADRON) 4 MG tablet TK 1 T PO BID FOR 3 DAYS AFTER EACH CYCLE OF CHEMO 09/15/17  Yes Ladell Pier, MD  ferrous sulfate 325 (65 FE) MG EC tablet Take 1 tablet  (325 mg total) by mouth 3 (three) times daily with meals. 06/18/17  Yes Ladell Pier, MD  hydrocortisone (ANUSOL-HC) 2.5 % rectal cream Apply 1 application topically 4 (four) times daily as needed for hemorrhoids. 07/27/17  Yes Theodis Blaze, MD  hydrocortisone cream 1 % Apply 1 application topically 3 (three) times daily as needed for itching (minor skin irritation). 07/27/17  Yes Theodis Blaze, MD  HYDROmorphone (DILAUDID) 4 MG tablet Take 1-2 tablets (4-8 mg total) by mouth every 4 (four) hours as needed for severe pain. 10/27/17  Yes Owens Shark, NP  lidocaine-prilocaine (EMLA) cream Apply to port site one hour prior to use. Do not rub in. Cover with plastic. Patient taking differently: Apply 1 application daily as needed topically. Apply to port site one hour prior to use. Do not rub in. Cover with plastic. 06/03/17  Yes Ladell Pier, MD  loperamide (IMODIUM) 2 MG capsule Take 1 capsule (2 mg total) by mouth as needed for diarrhea or loose stools (max 8 tabs per day). 08/12/17  Yes Owens Shark, NP  LORazepam (ATIVAN) 0.5 MG tablet Take 1 tablet (0.5 mg total) by mouth every 8 (eight) hours as needed for anxiety (or nausea). 07/16/17  Yes Owens Shark, NP  metoCLOPramide (REGLAN) 5 MG tablet Take 1 tablet (5 mg total) by mouth 3 (three) times daily before meals. 08/24/17  Yes Owens Shark, NP  morphine (MS CONTIN) 15 MG 12 hr tablet Take 15 mg by mouth 2 (two) times daily as needed. 11/03/17  Yes [provider]  ondansetron (ZOFRAN ODT) 4 MG disintegrating tablet Take 1 tablet (4 mg total) by mouth every 8 (eight) hours as needed for nausea or vomiting. 10/25/17  Yes Khatri, Hina, PA-C  potassium chloride SA (K-DUR,KLOR-CON) 20 MEQ tablet Take 1 tablet (20 mEq total) by mouth daily. 10/27/17  Yes Owens Shark, NP  fluconazole (DIFLUCAN) 100 MG tablet Take 1 tablet (100 mg total) by mouth daily. Patient not taking: Reported on 11/11/2017 10/06/17   Owens Shark, NP   prochlorperazine (COMPAZINE) 10 MG tablet Take 1 tablet (10 mg total) by mouth every 6 (six) hours as needed for nausea or vomiting. Patient not taking: Reported on 10/27/2017 07/07/17   Ladell Pier, MD    Scheduled Meds: Continuous Infusions: . sodium chloride    . piperacillin-tazobactam (ZOSYN)  IV Stopped (11/12/17 1116)  . vancomycin Stopped (11/12/17 0920)   PRN Meds:.acetaminophen **OR** acetaminophen, HYDROmorphone (DILAUDID) injection, ondansetron **OR** ondansetron (ZOFRAN) IV, polyethylene glycol, sodium chloride flush  Allergies as of 11/11/2017  . (No Known Allergies)    Family History  Problem Relation Age of Onset  . Hypertension Mother   . Arthritis Mother   . Diabetes Father   . Heart disease Father        ?valvular problems  . Hyperlipidemia Son   . Diabetes Brother     Social History   Socioeconomic History  . Marital status: Married    Spouse name: Not on file  . Number of children: 3  . Years of education: college grad--civics--states equivalent to High school here.  . Highest education level: Not on file  Occupational History  . Occupation: Leisure centre manager for an [employer]  . Financial resource strain: Not on file  . Food insecurity:    Worry: Not on file    Inability: Not on file  . Transportation needs:    Medical: Not on file    Non-medical: Not on file  Tobacco Use  . Smoking status: Never Smoker  . Smokeless tobacco: Never Used  Substance and Sexual Activity  . Alcohol use: No  . Drug use: No  . Sexual activity: Not on file  Lifestyle  . Physical activity:    Days per week: Not on file    Minutes per session: Not on file  . Stress: Not on file  Relationships  . Social connections:    Talks on phone: Not on file    Gets together: Not on file    Attends religious service: Not on file    Active member of club or organization: Not on file    Attends meetings of clubs or organizations: Not on file     Relationship status: Not on file  . Intimate partner violence:    Fear of current or ex partner: Not on file    Emotionally abused: Not on file    Physically abused: Not on file    Forced sexual activity: Not on file  Other Topics Concern  . Not on file  Social History Narrative   Originally from [place]   Moved to [place] In 72   Husband came in 2000   Lives at home with husband, 3 children and mother and father in Sports coach.    Review of Systems: Review of Systems  Constitutional: Positive for chills, fever, malaise/fatigue and weight loss.  HENT: Negative for hearing loss and tinnitus.   Eyes: Negative for blurred vision and double vision.  Respiratory: Negative for cough, hemoptysis and sputum  production.   Cardiovascular: Negative for chest pain and palpitations.  Gastrointestinal: Positive for abdominal pain. Negative for blood in stool, constipation, diarrhea, heartburn, melena, nausea and vomiting.  Genitourinary: Negative for dysuria and urgency.  Musculoskeletal: Positive for back pain, joint pain and myalgias.  Skin: Negative for itching and rash.  Neurological: Negative for seizures and loss of consciousness.  Endo/Heme/Allergies: Does not bruise/bleed easily.  Psychiatric/Behavioral: Negative for hallucinations and suicidal ideas.    Physical Exam: Vital signs: Vitals:   11/12/17 0900 11/12/17 1235  BP: (!) 131/93 (!) 131/93  Pulse: 93 91  Resp: 20 20  Temp: (!) 100.4 F (38 C) 98.2 F (36.8 C)  SpO2: 98% 100%   Last BM Date: 11/12/17 Physical Exam  Constitutional: She is oriented to person, place, and time. She appears well-developed and well-nourished. No distress.  HENT:  Head: Normocephalic and atraumatic.  Mouth/Throat: No oropharyngeal exudate.  Eyes: EOM are normal. No scleral icterus.  Neck: Normal range of motion. Neck supple.  Cardiovascular: Normal rate, regular rhythm and normal heart sounds.  Pulmonary/Chest: Effort normal and breath sounds  normal. No respiratory distress.  Abdominal: Soft. Bowel sounds are normal. She exhibits no distension. There is no rebound and no guarding.  Umbilical discoloration as well as tenderness noted.  Musculoskeletal: Normal range of motion. She exhibits no edema.  Neurological: She is alert and oriented to person, place, and time.  Skin: Skin is warm. No erythema.  Psychiatric: She has a normal mood and affect. Judgment normal.  Vitals reviewed.  GI:  Lab Results: Recent Labs    11/11/17 1450 11/12/17 0427  WBC 3.3* 2.6*  HGB  --  8.1*  HCT 28.2* 25.7*  PLT 55* 44*   BMET Recent Labs    11/11/17 1450 11/12/17 0427  NA 134* 138  K 4.1 3.6  CL 101 105  CO2 22 24  GLUCOSE 129 98  BUN 9 10  CREATININE 0.77 0.62  CALCIUM 9.3 8.5*   LFT Recent Labs    11/12/17 0427  PROT 6.2*  ALBUMIN 2.7*  AST 91*  ALT 64*  ALKPHOS 382*  BILITOT 1.8*   PT/INR Recent Labs    11/11/17 1931  LABPROT 16.6*  INR 1.35     Studies/Results: Dg Chest 2 View  Result Date: 11/11/2017 CLINICAL DATA:  Initial evaluation for acute fever, weakness. History of pancreatic cancer. EXAM: CHEST - 2 VIEW COMPARISON:  Prior radiograph from 10/25/2017. FINDINGS: Right-sided Port-A-Cath in place, stable. Cardiac and mediastinal silhouettes are stable in size and contour, and remain within normal limits. Lungs mildly hypoinflated. Mild left basilar scarring, similar to previous. There is diffusely increased prominence of the interstitial markings as compared to previous, which may reflect mild interstitial congestion or possibly atypical infection. No consolidative opacity to suggest alveolar pneumonia. No frank pulmonary edema. No pleural effusion. No pneumothorax. No acute osseus abnormality. Metallic biliary stent overlies the right upper quadrant. IMPRESSION: 1. Mild diffuse increased prominence of the interstitial markings, which may reflect mild interstitial congestion or possibly atypical infection. 2.  No consolidative opacity to suggest pneumonia. Electronically Signed   By: Jeannine Boga M.D.   On: 11/11/2017 23:05   Ct Abdomen Pelvis W Contrast  Result Date: 11/11/2017 CLINICAL DATA:  52 year old female with history of metastatic pancreatic cancer. EXAM: CT ABDOMEN AND PELVIS WITH CONTRAST TECHNIQUE: Multidetector CT imaging of the abdomen and pelvis was performed using the standard protocol following bolus administration of intravenous contrast. CONTRAST:  147mL ISOVUE-300 IOPAMIDOL (ISOVUE-300) INJECTION  61%, 2mL ISOVUE-300 IOPAMIDOL (ISOVUE-300) INJECTION 61% COMPARISON:  Abdominal CT dated 10/25/2017 FINDINGS: Lower chest: Minimal bibasilar dependent atelectatic changes. The visualized lung bases are otherwise clear. The tip of a central venous line is visualized in the right atrium. No intra-abdominal free air.  There is a small amount of ascites. Hepatobiliary: Multiple hepatic hypodense lesions with the largest lesion measuring 2.4 x 1.8 cm in segment IVA/VIII as seen previously consistent with metastatic disease. There is pneumobilia. A Wallstent is noted in the CBD. Pancreas: Ill-defined infiltrative mass in the head of the pancreas measures 6.3 x 4.3 cm. There is atrophy of the body and tail of the pancreas with dilatation of the main pancreatic duct. Spleen: Mild splenomegaly measuring up to 17 cm in length. Adrenals/Urinary Tract: The adrenal glands are unremarkable. There is mild left hydronephrosis similar to prior CT. The right kidney is unremarkable. The urinary bladder is partially distended and appears unremarkable. Stomach/Bowel: There is inflammatory changes and thickening of multiple jejunal loops as well as mild diffuse thickening of the ileal loops and terminal ileum. There is inflammatory changes of the proximal half of the colon. Findings most consistent with an enterocolitis. Clinical correlation is recommended. There is no bowel obstruction. The appendix is normal.  Vascular/Lymphatic: The abdominal aorta and IVC appear unremarkable. There is encasement of the celiac axis and proximal SMA by the infiltrative pancreatic tumor. 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 A 13 x 28 mm heterogeneously enhancing nodule along the right paracolic gutter consistent with metastatic disease, appears slightly larger compared to prior CT. Reproductive: The uterus is anteverted and grossly unremarkable. Other: None Musculoskeletal: Left sacral and small left femoral head sclerotic foci most consistent with bone island. No acute osseous pathology. Sclerotic changes of posterior element of T12 similar to prior CTs. IMPRESSION: 1. Findings most consistent with enterocolitis. Correlation with clinical exam recommended. No bowel obstruction. Normal appendix. 2. Infiltrative pancreatic head mass consistent with known malignancy. Multiple hepatic metastatic disease not significantly changed since the prior CT. 3. Slight interval increase in the size of the right paracolic metastatic nodule. 4. Mild left hydronephrosis similar to prior CT. 5. Diffuse mesenteric edema and small ascites. Electronically Signed   By: Anner Crete M.D.   On: 11/11/2017 23:24    Impression/Plan: - Mild elevated LFTs. Total bilirubin 1.8. Significantly elevated alkaline phosphatase is most likely from diffuse liver metastasis - fever with CT scan showing possible enterocolitis. - Metastatic pancreatic cancer. Status post recent chemotherapy. - Umbilical discoloration and tenderness.  Recommendations ------------------------- - Patient denied any nausea and vomiting. Abdominal exam is benign. T bilirubin is only 1.8. LFTs trending down. CT scan report reviewed. Biliary stent is in good position and looks patent. I have discussed personally with the radiologist today and he also agreed that biliary stent is patent. - No plan for inpatient ERCP at this  time - Continue antibiotics for possible enterocolitis. - okay to have soft diet and advance as tolerated. - GI will follow    LOS: 1 day   Otis Brace  MD, FACP 11/12/2017, 1:47 PM  Contact #  [phone]

## 2017-11-12 NOTE — Plan of Care (Signed)
Patient denies pain during 7 a to 7 p shift but has had several loose BM's, MD made aware, stool sent for GI panel. Patient medicated for anxiety x 1 with some improvement.  Tolerating soft diet.  Family members at bedside.

## 2017-11-12 NOTE — Progress Notes (Signed)
Patient has had two episodes of loose stools this morning, the last incontinently.  Patient requesting something to stop the diarrhea, Md notified of diarrhea, orders received for GI panel to be sent.  Patient also requesting telemetry to be discontinued which MD deferred d/t diagnosis of sepsis.  Patient also wants to eat, awaiting Gi consult at this time.    Educated patient on above.  Verbalized understanding.

## 2017-11-12 NOTE — Progress Notes (Addendum)
PROGRESS NOTE    Crystal Carlson  OHY:[id number] DOB: [birth year] DOA: 11/11/2017 PCP: Mack Hook, MD     Brief Narrative:  Crystal Carlson is a 52 yo female with past medical history significant for metastatic pancreatic cancer who currently follows with Dr. Benay Spice.  She presented to her oncology office on 3/27 with complaints of fevers and chills.  She had no complaints of chest pain, shortness of breath, cough, abdominal pain, nausea, vomiting, diarrhea.  Her fever was as high as 101.8 at home.  Labs were obtained in the office which revealed elevated LFT, elevated bilirubin level.  She was directly admitted to [facility] for further evaluation of her fever and questionable biliary stent patency.  Blood cultures were drawn and patient was started on broad-spectrum antibiotics.  Assessment & Plan:   Principal Problem:   Sepsis ([place]) Active Problems:   Prediabetes   HTN (hypertension)   Elevated LFTs   Pancreatic cancer metastasized to liver [facility])   Chronic pain due to malignant neoplastic disease   Encounter for antineoplastic chemotherapy   Protein-calorie malnutrition, moderate (HCC)   Thrombocytopenia ([place])  Sepsis in immunocompromised patient, secondary to ?enterocolitis  -Respiratory viral panel negative  -CXR: No consolidative opacity to suggest pneumonia -Urine culture pending -Blood cultures pending  -Currently on empiric Vanco and Zosyn -IVF -CT abdomen pelvis revealed enterocolitis, although she has no clinical abdominal pain, nausea, vomiting, diarrhea. Stent and biliary tract appear to be patent  Elevated LFT  -CT abdomen pelvis revealed enterocolitis, stent and biliary tract appear to be patent -LFT trending down -Continue to monitor   Metastatic pancreatic cancer -Follows with Dr. Benay Spice   Prediabetes -A1c 5.4   Pancytopenia -Possibly chemotherapy-induced -Monitor for acute bleeding  Low TSH -Check free T4    DVT prophylaxis: SCD Code  Status: Full Family Communication: At bedside Disposition Plan: Pending improvement in clinical status, blood culture results   Consultants:   Oncology  GI   Procedures:   None   Antimicrobials:  Anti-infectives (From admission, onward)   Start     Dose/Rate Route Frequency Ordered Stop   11/12/17 0800  vancomycin (VANCOCIN) 500 mg in sodium chloride 0.9 % 100 mL IVPB     500 mg 100 mL/hr over 60 Minutes Intravenous Every 12 hours 11/11/17 1938     11/12/17 0000  ceFEPIme (MAXIPIME) 1 g in sodium chloride 0.9 % 100 mL IVPB  Status:  Discontinued     1 g 200 mL/hr over 30 Minutes Intravenous Every 8 hours 11/11/17 1938 11/11/17 2007   11/11/17 2200  piperacillin-tazobactam (ZOSYN) IVPB 3.375 g     3.375 g 12.5 mL/hr over 240 Minutes Intravenous Every 8 hours 11/11/17 2007     11/11/17 2030  vancomycin (VANCOCIN) 1,250 mg in sodium chloride 0.9 % 250 mL IVPB     1,250 mg 166.7 mL/hr over 90 Minutes Intravenous  Once 11/11/17 1924 11/11/17 2217       Subjective: Complains of fevers and chills, no body aches or any abdominal complaints.  Complains of hunger.  Objective: Vitals:   11/11/17 2223 11/12/17 0608 11/12/17 0725 11/12/17 0900  BP: 127/75 121/83  (!) 131/93  Pulse: 84 94  93  Resp: 16 18  20   Temp: 99.5 F (37.5 C) 100.1 F (37.8 C) 99.3 F (37.4 C) (!) 100.4 F (38 C)  TempSrc: Oral Oral Oral Oral  SpO2: 100% 99%  98%  Weight:      Height:  Intake/Output Summary (Last 24 hours) at 11/12/2017 1118 Last data filed at 11/12/2017 0600 Gross per 24 hour  Intake 141.25 ml  Output -  Net 141.25 ml   Filed Weights   11/11/17 1754  Weight: 52 kg (114 lb 10.2 oz)    Examination:  General exam: Appears calm and comfortable  Respiratory system: Clear to auscultation. Respiratory effort normal. Cardiovascular system: S1 & S2 heard, RRR. No JVD, murmurs, rubs, gallops or clicks. No pedal edema. Gastrointestinal system: Abdomen is nondistended, soft and  nontender. No organomegaly or masses felt. Normal bowel sounds heard. Central nervous system: Alert and oriented. No focal neurological deficits. Extremities: Symmetric 5 x 5 power. Psychiatry: Judgement and insight appear normal. Mood & affect appropriate.   Data Reviewed: I have personally reviewed following labs and imaging studies  CBC: Recent Labs  Lab 11/11/17 1450 11/12/17 0427  WBC 3.3* 2.6*  NEUTROABS 3.0  --   HGB  --  8.1*  HCT 28.2* 25.7*  MCV 84.7 89.2  PLT 55* 44*   Basic Metabolic Panel: Recent Labs  Lab 11/11/17 1450 11/12/17 0427  NA 134* 138  K 4.1 3.6  CL 101 105  CO2 22 24  GLUCOSE 129 98  BUN 9 10  CREATININE 0.77 0.62  CALCIUM 9.3 8.5*  MG  --  1.8  PHOS  --  3.3   GFR: Estimated Creatinine Clearance: 69.8 mL/min (by C-G formula based on SCr of 0.62 mg/dL). Liver Function Tests: Recent Labs  Lab 11/11/17 1450 11/12/17 0427  AST 151* 91*  ALT 94* 64*  ALKPHOS 536* 382*  BILITOT 2.3* 1.8*  PROT 7.1 6.2*  ALBUMIN 3.0* 2.7*   No results for input(s): LIPASE, AMYLASE in the last 168 hours. No results for input(s): AMMONIA in the last 168 hours. Coagulation Profile: Recent Labs  Lab 11/11/17 1931  INR 1.35   Cardiac Enzymes: No results for input(s): CKTOTAL, CKMB, CKMBINDEX, TROPONINI in the last 168 hours. BNP (last 3 results) No results for input(s): PROBNP in the last 8760 hours. HbA1C: Recent Labs    11/12/17 0427  HGBA1C 5.4   CBG: No results for input(s): GLUCAP in the last 168 hours. Lipid Profile: No results for input(s): CHOL, HDL, LDLCALC, TRIG, CHOLHDL, LDLDIRECT in the last 72 hours. Thyroid Function Tests: Recent Labs    11/12/17 0427  TSH 0.314*   Anemia Panel: No results for input(s): VITAMINB12, FOLATE, FERRITIN, TIBC, IRON, RETICCTPCT in the last 72 hours. Sepsis Labs: Recent Labs  Lab 11/11/17 1931 11/11/17 2155  PROCALCITON 3.03  --   LATICACIDVEN 0.8 0.8    Recent Results (from the past 240  hour(s))  Culture, Blood     Status: None (Preliminary result)   Collection Time: 11/11/17  2:48 PM  Result Value Ref Range Status   Specimen Description BLOOD BLOOD LEFT ARM  Final   Special Requests   Final    BOTTLES DRAWN AEROBIC AND ANAEROBIC Blood Culture adequate volume   Culture PENDING  Incomplete   Report Status PENDING  Incomplete  Culture, Blood     Status: None (Preliminary result)   Collection Time: 11/11/17  3:50 PM  Result Value Ref Range Status   Specimen Description   Final    PORTA CATH Performed at [facility], [place] [address], [place], [place] [address]    Special Requests   Final    BOTTLES DRAWN AEROBIC AND ANAEROBIC Blood Culture adequate volume   Culture PENDING  Incomplete  Report Status PENDING  Incomplete  MRSA PCR Screening     Status: None   Collection Time: 11/11/17  8:54 PM  Result Value Ref Range Status   MRSA by PCR NEGATIVE NEGATIVE Final    Comment:        The GeneXpert MRSA Assay (FDA approved for NASAL specimens only), is one component of a comprehensive MRSA colonization surveillance program. It is not intended to diagnose MRSA infection nor to guide or monitor treatment for MRSA infections. Performed at [facility], [place] [address] [place], [place] [address]   Respiratory Panel by PCR     Status: None   Collection Time: 11/11/17  8:54 PM  Result Value Ref Range Status   Adenovirus NOT DETECTED NOT DETECTED Final   Coronavirus 229E NOT DETECTED NOT DETECTED Final   Coronavirus HKU1 NOT DETECTED NOT DETECTED Final   Coronavirus NL63 NOT DETECTED NOT DETECTED Final   Coronavirus OC43 NOT DETECTED NOT DETECTED Final   Metapneumovirus NOT DETECTED NOT DETECTED Final   Rhinovirus / Enterovirus NOT DETECTED NOT DETECTED Final   Influenza A NOT DETECTED NOT DETECTED Final   Influenza B NOT DETECTED NOT DETECTED Final   Parainfluenza Virus 1 NOT DETECTED NOT DETECTED Final   Parainfluenza  Virus 2 NOT DETECTED NOT DETECTED Final   Parainfluenza Virus 3 NOT DETECTED NOT DETECTED Final   Parainfluenza Virus 4 NOT DETECTED NOT DETECTED Final   Respiratory Syncytial Virus NOT DETECTED NOT DETECTED Final   Bordetella pertussis NOT DETECTED NOT DETECTED Final   Chlamydophila pneumoniae NOT DETECTED NOT DETECTED Final   Mycoplasma pneumoniae NOT DETECTED NOT DETECTED Final    Comment: Performed at [facility], [place] [address], [place], [place] [address]       Radiology Studies: Dg Chest 2 View  Result Date: 11/11/2017 CLINICAL DATA:  Initial evaluation for acute fever, weakness. History of pancreatic cancer. EXAM: CHEST - 2 VIEW COMPARISON:  Prior radiograph from 10/25/2017. FINDINGS: Right-sided Port-A-Cath in place, stable. Cardiac and mediastinal silhouettes are stable in size and contour, and remain within normal limits. Lungs mildly hypoinflated. Mild left basilar scarring, similar to previous. There is diffusely increased prominence of the interstitial markings as compared to previous, which may reflect mild interstitial congestion or possibly atypical infection. No consolidative opacity to suggest alveolar pneumonia. No frank pulmonary edema. No pleural effusion. No pneumothorax. No acute osseus abnormality. Metallic biliary stent overlies the right upper quadrant. IMPRESSION: 1. Mild diffuse increased prominence of the interstitial markings, which may reflect mild interstitial congestion or possibly atypical infection. 2. No consolidative opacity to suggest pneumonia. Electronically Signed   By: Jeannine Boga M.D.   On: 11/11/2017 23:05   Ct Abdomen Pelvis W Contrast  Result Date: 11/11/2017 CLINICAL DATA:  52 year old female with history of metastatic pancreatic cancer. EXAM: CT ABDOMEN AND PELVIS WITH CONTRAST TECHNIQUE: Multidetector CT imaging of the abdomen and pelvis was performed using the standard protocol following bolus administration of intravenous contrast.  CONTRAST:  153mL ISOVUE-300 IOPAMIDOL (ISOVUE-300) INJECTION 61%, 52mL ISOVUE-300 IOPAMIDOL (ISOVUE-300) INJECTION 61% COMPARISON:  Abdominal CT dated 10/25/2017 FINDINGS: Lower chest: Minimal bibasilar dependent atelectatic changes. The visualized lung bases are otherwise clear. The tip of a central venous line is visualized in the right atrium. No intra-abdominal free air.  There is a small amount of ascites. Hepatobiliary: Multiple hepatic hypodense lesions with the largest lesion measuring 2.4 x 1.8 cm in segment IVA/VIII as seen previously consistent with metastatic disease. There is pneumobilia.  A Wallstent is noted in the CBD. Pancreas: Ill-defined infiltrative mass in the head of the pancreas measures 6.3 x 4.3 cm. There is atrophy of the body and tail of the pancreas with dilatation of the main pancreatic duct. Spleen: Mild splenomegaly measuring up to 17 cm in length. Adrenals/Urinary Tract: The adrenal glands are unremarkable. There is mild left hydronephrosis similar to prior CT. The right kidney is unremarkable. The urinary bladder is partially distended and appears unremarkable. Stomach/Bowel: There is inflammatory changes and thickening of multiple jejunal loops as well as mild diffuse thickening of the ileal loops and terminal ileum. There is inflammatory changes of the proximal half of the colon. Findings most consistent with an enterocolitis. Clinical correlation is recommended. There is no bowel obstruction. The appendix is normal. Vascular/Lymphatic: The abdominal aorta and IVC appear unremarkable. There is encasement of the celiac axis and proximal SMA by the infiltrative pancreatic tumor. 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 A 13 x 28 mm heterogeneously enhancing nodule along the right paracolic gutter consistent with metastatic disease, appears slightly larger compared to prior CT. Reproductive: The uterus is anteverted and grossly  unremarkable. Other: None Musculoskeletal: Left sacral and small left femoral head sclerotic foci most consistent with bone island. No acute osseous pathology. Sclerotic changes of posterior element of T12 similar to prior CTs. IMPRESSION: 1. Findings most consistent with enterocolitis. Correlation with clinical exam recommended. No bowel obstruction. Normal appendix. 2. Infiltrative pancreatic head mass consistent with known malignancy. Multiple hepatic metastatic disease not significantly changed since the prior CT. 3. Slight interval increase in the size of the right paracolic metastatic nodule. 4. Mild left hydronephrosis similar to prior CT. 5. Diffuse mesenteric edema and small ascites. Electronically Signed   By: Anner Crete M.D.   On: 11/11/2017 23:24      Scheduled Meds: Continuous Infusions: . sodium chloride    . piperacillin-tazobactam (ZOSYN)  IV Stopped (11/12/17 1116)  . vancomycin Stopped (11/12/17 0920)     LOS: 1 day    Time spent: 30 minutes   Dessa Phi, DO [facility] [email or web address] Password [facility] 11/12/2017, 11:18 AM

## 2017-11-13 LAB — BLOOD CULTURE ID PANEL (REFLEXED)
ACINETOBACTER BAUMANNII: NOT DETECTED
CANDIDA ALBICANS: NOT DETECTED
CANDIDA GLABRATA: NOT DETECTED
CANDIDA KRUSEI: NOT DETECTED
CANDIDA PARAPSILOSIS: NOT DETECTED
CANDIDA TROPICALIS: NOT DETECTED
ENTEROBACTERIACEAE SPECIES: NOT DETECTED
ENTEROCOCCUS SPECIES: NOT DETECTED
ESCHERICHIA COLI: NOT DETECTED
Enterobacter cloacae complex: NOT DETECTED
Haemophilus influenzae: NOT DETECTED
KLEBSIELLA OXYTOCA: NOT DETECTED
KLEBSIELLA PNEUMONIAE: NOT DETECTED
LISTERIA MONOCYTOGENES: NOT DETECTED
Neisseria meningitidis: NOT DETECTED
Proteus species: NOT DETECTED
Pseudomonas aeruginosa: NOT DETECTED
STREPTOCOCCUS PYOGENES: NOT DETECTED
Serratia marcescens: NOT DETECTED
Staphylococcus aureus (BCID): NOT DETECTED
Staphylococcus species: NOT DETECTED
Streptococcus agalactiae: NOT DETECTED
Streptococcus pneumoniae: NOT DETECTED
Streptococcus species: NOT DETECTED

## 2017-11-13 LAB — BASIC METABOLIC PANEL
ANION GAP: 6 (ref 5–15)
BUN: 9 mg/dL (ref 6–20)
CALCIUM: 8.2 mg/dL — AB (ref 8.9–10.3)
CHLORIDE: 110 mmol/L (ref 101–111)
CO2: 26 mmol/L (ref 22–32)
CREATININE: 0.68 mg/dL (ref 0.44–1.00)
GFR calc non Af Amer: >60 mL/min (ref >60)
Glucose, Bld: 90 mg/dL (ref 65–99)
Potassium: 2.9 mmol/L — ABNORMAL LOW (ref 3.5–5.1)
SODIUM: 142 mmol/L (ref 135–145)

## 2017-11-13 LAB — GASTROINTESTINAL PANEL BY PCR, STOOL (REPLACES STOOL CULTURE)
ADENOVIRUS F40/41: NOT DETECTED
ASTROVIRUS: NOT DETECTED
CAMPYLOBACTER SPECIES: NOT DETECTED
Cryptosporidium: NOT DETECTED
Cyclospora cayetanensis: NOT DETECTED
ENTEROPATHOGENIC E COLI (EPEC): NOT DETECTED
ENTEROTOXIGENIC E COLI (ETEC): NOT DETECTED
Entamoeba histolytica: NOT DETECTED
Enteroaggregative E coli (EAEC): NOT DETECTED
Giardia lamblia: NOT DETECTED
NOROVIRUS GI/GII: NOT DETECTED
PLESIMONAS SHIGELLOIDES: NOT DETECTED
Rotavirus A: NOT DETECTED
SAPOVIRUS (I, II, IV, AND V): NOT DETECTED
SHIGA LIKE TOXIN PRODUCING E COLI (STEC): NOT DETECTED
Salmonella species: NOT DETECTED
Shigella/Enteroinvasive E coli (EIEC): NOT DETECTED
VIBRIO SPECIES: NOT DETECTED
Vibrio cholerae: NOT DETECTED
Yersinia enterocolitica: NOT DETECTED

## 2017-11-13 LAB — DIFFERENTIAL
BASOS ABS: 0 10*3/uL (ref 0.0–0.1)
BASOS PCT: 0 %
Eosinophils Absolute: 0.1 10*3/uL (ref 0.0–0.7)
Eosinophils Relative: 2 %
Lymphocytes Relative: 18 %
Lymphs Abs: 0.5 10*3/uL — ABNORMAL LOW (ref 0.7–4.0)
MONO ABS: 0.4 10*3/uL (ref 0.1–1.0)
Monocytes Relative: 15 %
NEUTROS ABS: 1.7 10*3/uL (ref 1.7–7.7)
Neutrophils Relative %: 65 %

## 2017-11-13 LAB — CBC
HEMATOCRIT: 22.4 % — AB (ref 36.0–46.0)
HEMOGLOBIN: 7.1 g/dL — AB (ref 12.0–15.0)
MCH: 28 pg (ref 26.0–34.0)
MCHC: 31.7 g/dL (ref 30.0–36.0)
MCV: 88.2 fL (ref 78.0–100.0)
Platelets: 50 10*3/uL — ABNORMAL LOW (ref 150–400)
RBC: 2.54 MIL/uL — ABNORMAL LOW (ref 3.87–5.11)
RDW: 16.7 % — ABNORMAL HIGH (ref 11.5–15.5)
WBC: 2.4 10*3/uL — AB (ref 4.0–10.5)

## 2017-11-13 LAB — HEPATIC FUNCTION PANEL
ALBUMIN: 2.3 g/dL — AB (ref 3.5–5.0)
ALK PHOS: 279 U/L — AB (ref 38–126)
ALT: 44 U/L (ref 14–54)
AST: 45 U/L — AB (ref 15–41)
Bilirubin, Direct: 0.3 mg/dL (ref 0.1–0.5)
Indirect Bilirubin: 0.4 mg/dL (ref 0.3–0.9)
TOTAL PROTEIN: 5.5 g/dL — AB (ref 6.5–8.1)
Total Bilirubin: 0.7 mg/dL (ref 0.3–1.2)

## 2017-11-13 LAB — URINE CULTURE: CULTURE: NO GROWTH

## 2017-11-13 MED ORDER — HYDROMORPHONE HCL 2 MG PO TABS: 4.0000 mg | ORAL_TABLET | ORAL | Status: DC | PRN

## 2017-11-13 MED ORDER — POTASSIUM CHLORIDE CRYS ER 20 MEQ PO TBCR
40.0000 meq | EXTENDED_RELEASE_TABLET | ORAL | Status: AC
  Administered 2017-11-13 (×2): 40 meq via ORAL
  Filled 2017-11-13 (×2): qty 2

## 2017-11-13 NOTE — Progress Notes (Signed)
PHARMACY - PHYSICIAN COMMUNICATION CRITICAL VALUE ALERT - BLOOD CULTURE IDENTIFICATION (BCID)  Crystal Carlson is an 52 y.o. female who presented to [facility] on 11/11/2017   Name of physician (or Provider) Contacted: Dr Maylene Roes  Current antibiotics: Vancomycin and Zosyn  Changes to prescribed antibiotics recommended:  Recommendations accepted by provider  D/C Vancomycin, Continue Zosyn  Results for orders placed or performed in visit on 11/11/17  Blood Culture ID Panel (Reflexed) (Collected: 11/11/2017  3:50 PM)  Result Value Ref Range   Enterococcus species NOT DETECTED NOT DETECTED   Listeria monocytogenes NOT DETECTED NOT DETECTED   Staphylococcus species NOT DETECTED NOT DETECTED   Staphylococcus aureus NOT DETECTED NOT DETECTED   Streptococcus species NOT DETECTED NOT DETECTED   Streptococcus agalactiae NOT DETECTED NOT DETECTED   Streptococcus pneumoniae NOT DETECTED NOT DETECTED   Streptococcus pyogenes NOT DETECTED NOT DETECTED   Acinetobacter baumannii NOT DETECTED NOT DETECTED   Enterobacteriaceae species NOT DETECTED NOT DETECTED   Enterobacter cloacae complex NOT DETECTED NOT DETECTED   Escherichia coli NOT DETECTED NOT DETECTED   Klebsiella oxytoca NOT DETECTED NOT DETECTED   Klebsiella pneumoniae NOT DETECTED NOT DETECTED   Proteus species NOT DETECTED NOT DETECTED   Serratia marcescens NOT DETECTED NOT DETECTED   Haemophilus influenzae NOT DETECTED NOT DETECTED   Neisseria meningitidis NOT DETECTED NOT DETECTED   Pseudomonas aeruginosa NOT DETECTED NOT DETECTED   Candida albicans NOT DETECTED NOT DETECTED   Candida glabrata NOT DETECTED NOT DETECTED   Candida krusei NOT DETECTED NOT DETECTED   Candida parapsilosis NOT DETECTED NOT DETECTED   Candida tropicalis NOT DETECTED NOT DETECTED    Leeroy Bock 11/13/2017  8:11 AM

## 2017-11-13 NOTE — Progress Notes (Signed)
PROGRESS NOTE    Crystal Carlson  OXB:[id number] DOB: [birth year] DOA: 11/11/2017 PCP: Mack Hook, MD     Brief Narrative:  Crystal Carlson is a 52 yo female with past medical history significant for metastatic pancreatic cancer who currently follows with Dr. Benay Spice.  She presented to her oncology office on 3/27 with complaints of fevers and chills.  She had no complaints of chest pain, shortness of breath, cough, abdominal pain, nausea, vomiting, diarrhea.  Her fever was as high as 101.8 at home.  Labs were obtained in the office which revealed elevated LFT, elevated bilirubin level.  She was directly admitted to [facility] for further evaluation of her fever and questionable biliary stent patency.  Blood cultures were drawn and patient was started on broad-spectrum antibiotics.  Assessment & Plan:   Principal Problem:   Sepsis ([place]) Active Problems:   Prediabetes   HTN (hypertension)   Elevated LFTs   Pancreatic cancer metastasized to liver [facility])   Chronic pain due to malignant neoplastic disease   Encounter for antineoplastic chemotherapy   Protein-calorie malnutrition, moderate (HCC)   Thrombocytopenia ([place])  Sepsis in immunocompromised patient, secondary to enterocolitis  -CXR: No consolidative opacity to suggest pneumonia -Respiratory viral panel negative  -Urine culture negative  -GI PCR negative  -IVF -CT abdomen pelvis revealed enterocolitis, although she has no clinical abdominal pain, nausea, vomiting, diarrhea. Stent and biliary tract appear to be patent -Blood cultures positive for gram neg rod, identification pending  -Currently on empiric Zosyn  Elevated LFT  -CT abdomen pelvis revealed enterocolitis, stent and biliary tract appear to be patent -LFT trending down -GI consulted, no further recommendations as biliary stent is patent and in good position.  Metastatic pancreatic cancer -Follows with Dr. Benay Spice   Prediabetes -A1c 5.4    Pancytopenia -Possibly chemotherapy-induced -Monitor for acute bleeding  Low TSH -TSH just outside of normal range at 0.314 and free T4 1.16. Will likely need repeat labs and outpatient radioiodine uptake scan as outpatient for further evaluation once acute illness resolves   DVT prophylaxis: SCD Code Status: Full Family Communication: At bedside Disposition Plan: Pending improvement in clinical status, final blood culture results   Consultants:   Oncology  GI   Procedures:   None   Antimicrobials:  Anti-infectives (From admission, onward)   Start     Dose/Rate Route Frequency Ordered Stop   11/12/17 0800  vancomycin (VANCOCIN) 500 mg in sodium chloride 0.9 % 100 mL IVPB  Status:  Discontinued     500 mg 100 mL/hr over 60 Minutes Intravenous Every 12 hours 11/11/17 1938 11/13/17 0756   11/12/17 0000  ceFEPIme (MAXIPIME) 1 g in sodium chloride 0.9 % 100 mL IVPB  Status:  Discontinued     1 g 200 mL/hr over 30 Minutes Intravenous Every 8 hours 11/11/17 1938 11/11/17 2007   11/11/17 2200  piperacillin-tazobactam (ZOSYN) IVPB 3.375 g     3.375 g 12.5 mL/hr over 240 Minutes Intravenous Every 8 hours 11/11/17 2007     11/11/17 2030  vancomycin (VANCOCIN) 1,250 mg in sodium chloride 0.9 % 250 mL IVPB     1,250 mg 166.7 mL/hr over 90 Minutes Intravenous  Once 11/11/17 1924 11/11/17 2217       Subjective: No new complaints.  States that she is feeling better since admission.  Denies any abdominal pain, nausea, vomiting.  No further loose stools since yesterday.  Objective: Vitals:   11/12/17 1852 11/12/17 2217 11/13/17 0524 11/13/17 1354  BP:  118/85 128/83 125/90  Pulse:  91 88 88  Resp:  20 20 18   Temp: 98.8 F (37.1 C) 98.7 F (37.1 C) 99.2 F (37.3 C) 98.9 F (37.2 C)  TempSrc: Oral Oral Oral Oral  SpO2:  100% 100% 100%  Weight:      Height:        Intake/Output Summary (Last 24 hours) at 11/13/2017 1355 Last data filed at 11/13/2017 0916 Gross per 24  hour  Intake 1828.75 ml  Output -  Net 1828.75 ml   Filed Weights   11/11/17 1754  Weight: 52 kg (114 lb 10.2 oz)    Examination: General exam: Appears calm and comfortable  Respiratory system: Clear to auscultation. Respiratory effort normal. Cardiovascular system: S1 & S2 heard, RRR. No JVD, murmurs, rubs, gallops or clicks. No pedal edema. Gastrointestinal system: Abdomen is nondistended, soft and nontender. No organomegaly or masses felt. Normal bowel sounds heard. Central nervous system: Alert and oriented. No focal neurological deficits. Extremities: Symmetric 5 x 5 power. Skin: No rashes, lesions or ulcers Psychiatry: Judgement and insight appear normal. Mood & affect appropriate.    Data Reviewed: I have personally reviewed following labs and imaging studies  CBC: Recent Labs  Lab 11/11/17 1450 11/12/17 0427 11/13/17 0426  WBC 3.3* 2.6* 2.4*  NEUTROABS 3.0  --   --   HGB  --  8.1* 7.1*  HCT 28.2* 25.7* 22.4*  MCV 84.7 89.2 88.2  PLT 55* 44* 50*   Basic Metabolic Panel: Recent Labs  Lab 11/11/17 1450 11/12/17 0427 11/13/17 0426  NA 134* 138 142  K 4.1 3.6 2.9*  CL 101 105 110  CO2 22 24 26   GLUCOSE 129 98 90  BUN 9 10 9   CREATININE 0.77 0.62 0.68  CALCIUM 9.3 8.5* 8.2*  MG  --  1.8  --   PHOS  --  3.3  --    GFR: Estimated Creatinine Clearance: 69.8 mL/min (by C-G formula based on SCr of 0.68 mg/dL). Liver Function Tests: Recent Labs  Lab 11/11/17 1450 11/12/17 0427 11/13/17 0426  AST 151* 91* 45*  ALT 94* 64* 44  ALKPHOS 536* 382* 279*  BILITOT 2.3* 1.8* 0.7  PROT 7.1 6.2* 5.5*  ALBUMIN 3.0* 2.7* 2.3*   No results for input(s): LIPASE, AMYLASE in the last 168 hours. No results for input(s): AMMONIA in the last 168 hours. Coagulation Profile: Recent Labs  Lab 11/11/17 1931  INR 1.35   Cardiac Enzymes: No results for input(s): CKTOTAL, CKMB, CKMBINDEX, TROPONINI in the last 168 hours. BNP (last 3 results) No results for input(s):  PROBNP in the last 8760 hours. HbA1C: Recent Labs    11/12/17 0427  HGBA1C 5.4   CBG: No results for input(s): GLUCAP in the last 168 hours. Lipid Profile: No results for input(s): CHOL, HDL, LDLCALC, TRIG, CHOLHDL, LDLDIRECT in the last 72 hours. Thyroid Function Tests: Recent Labs    11/12/17 0427  TSH 0.314*  FREET4 1.16*   Anemia Panel: No results for input(s): VITAMINB12, FOLATE, FERRITIN, TIBC, IRON, RETICCTPCT in the last 72 hours. Sepsis Labs: Recent Labs  Lab 11/11/17 1931 11/11/17 2155  PROCALCITON 3.03  --   LATICACIDVEN 0.8 0.8    Recent Results (from the past 240 hour(s))  Culture, Blood     Status: None (Preliminary result)   Collection Time: 11/11/17  2:48 PM  Result Value Ref Range Status   Specimen Description BLOOD BLOOD LEFT ARM  Final   Special Requests   Final  BOTTLES DRAWN AEROBIC AND ANAEROBIC Blood Culture adequate volume   Culture  Setup Time   Final    GRAM NEGATIVE RODS ANAEROBIC BOTTLE ONLY CRITICAL RESULT CALLED TO, READ BACK BY AND VERIFIED WITH: N. [place], AT [id number] 11/13/17 BY Rush Landmark Performed at [facility], [place] [address] [place], [place] [address]    Culture PENDING  Incomplete   Report Status PENDING  Incomplete  Urine Culture     Status: None   Collection Time: 11/11/17  2:50 PM  Result Value Ref Range Status   Specimen Description   Final    URINE, CLEAN CATCH Performed at [facility], [place] [address] [place], [place] [address]    Special Requests   Final    NONE Performed at [facility], [place] [address], [place], [place] [address]    Culture   Final    NO GROWTH Performed at [facility], [place] [address], [place], [place] [address]    Report Status 11/12/2017 FINAL  Final  Culture, Blood     Status: None (Preliminary result)   Collection Time: 11/11/17  3:50 PM  Result Value Ref Range Status   Specimen Description   Final    PORTA  CATH Performed at [facility], [place] [address] [place], [place] [address]    Special Requests   Final    BOTTLES DRAWN AEROBIC AND ANAEROBIC Blood Culture adequate volume   Culture  Setup Time   Final    GRAM NEGATIVE RODS IN BOTH AEROBIC AND ANAEROBIC BOTTLES CRITICAL RESULT CALLED TO, READ BACK BY AND VERIFIED WITH: N. [place], AT 4268 11/13/17 BY Rush Landmark Performed at [facility], [place] [address], [place], [place] [address]    Culture GRAM NEGATIVE RODS  Final   Report Status PENDING  Incomplete  Blood Culture ID Panel (Reflexed)     Status: None   Collection Time: 11/11/17  3:50 PM  Result Value Ref Range Status   Enterococcus species NOT DETECTED NOT DETECTED Final   Listeria monocytogenes NOT DETECTED NOT DETECTED Final   Staphylococcus species NOT DETECTED NOT DETECTED Final   Staphylococcus aureus NOT DETECTED NOT DETECTED Final   Streptococcus species NOT DETECTED NOT DETECTED Final   Streptococcus agalactiae NOT DETECTED NOT DETECTED Final   Streptococcus pneumoniae NOT DETECTED NOT DETECTED Final   Streptococcus pyogenes NOT DETECTED NOT DETECTED Final   Acinetobacter baumannii NOT DETECTED NOT DETECTED Final   Enterobacteriaceae species NOT DETECTED NOT DETECTED Final   Enterobacter cloacae complex NOT DETECTED NOT DETECTED Final   Escherichia coli NOT DETECTED NOT DETECTED Final   Klebsiella oxytoca NOT DETECTED NOT DETECTED Final   Klebsiella pneumoniae NOT DETECTED NOT DETECTED Final   Proteus species NOT DETECTED NOT DETECTED Final   Serratia marcescens NOT DETECTED NOT DETECTED Final   Haemophilus influenzae NOT DETECTED NOT DETECTED Final   Neisseria meningitidis NOT DETECTED NOT DETECTED Final   Pseudomonas aeruginosa NOT DETECTED NOT DETECTED Final   Candida albicans NOT DETECTED NOT DETECTED Final   Candida glabrata NOT DETECTED NOT DETECTED Final   Candida krusei NOT DETECTED NOT DETECTED Final   Candida parapsilosis  NOT DETECTED NOT DETECTED Final   Candida tropicalis NOT DETECTED NOT DETECTED Final    Comment: Performed at [facility], [place]. [address], [place], [place] [address]  MRSA PCR Screening     Status: None   Collection Time: 11/11/17  8:54 PM  Result Value Ref Range Status   MRSA by PCR NEGATIVE NEGATIVE Final    Comment:        The GeneXpert MRSA Assay (FDA approved for NASAL specimens only), is one component of a comprehensive MRSA colonization surveillance program. It is not intended to diagnose MRSA infection nor to guide or monitor treatment for MRSA infections. Performed at [facility], [place] [address], [place], [place] [address]   Respiratory Panel by PCR     Status: None   Collection Time: 11/11/17  8:54 PM  Result Value Ref Range Status   Adenovirus NOT DETECTED NOT DETECTED Final   Coronavirus 229E NOT DETECTED NOT DETECTED Final   Coronavirus HKU1 NOT DETECTED NOT DETECTED Final   Coronavirus NL63 NOT DETECTED NOT DETECTED Final   Coronavirus OC43 NOT DETECTED NOT DETECTED Final   Metapneumovirus NOT DETECTED NOT DETECTED Final   Rhinovirus / Enterovirus NOT DETECTED NOT DETECTED Final   Influenza A NOT DETECTED NOT DETECTED Final   Influenza B NOT DETECTED NOT DETECTED Final   Parainfluenza Virus 1 NOT DETECTED NOT DETECTED Final   Parainfluenza Virus 2 NOT DETECTED NOT DETECTED Final   Parainfluenza Virus 3 NOT DETECTED NOT DETECTED Final   Parainfluenza Virus 4 NOT DETECTED NOT DETECTED Final   Respiratory Syncytial Virus NOT DETECTED NOT DETECTED Final   Bordetella pertussis NOT DETECTED NOT DETECTED Final   Chlamydophila pneumoniae NOT DETECTED NOT DETECTED Final   Mycoplasma pneumoniae NOT DETECTED NOT DETECTED Final    Comment: Performed at [facility], [place] [address] [place], [place] [address]  Urine Culture     Status: None   Collection Time: 11/12/17  9:54 AM  Result Value Ref Range Status   Specimen Description   Final     URINE, CLEAN CATCH Performed at [facility], [place] [address], [place], [place] [address]    Special Requests   Final    NONE Performed at [facility], [place] [address], [place], [place] [address]    Culture   Final    NO GROWTH Performed at [facility], [place] [address] [place], [place] [address]    Report Status 11/13/2017 FINAL  Final  Gastrointestinal Panel by PCR , Stool     Status: None   Collection Time: 11/12/17 12:40 PM  Result Value Ref Range Status   Campylobacter species NOT DETECTED NOT DETECTED Final   Plesimonas shigelloides NOT DETECTED NOT DETECTED Final   Salmonella species NOT DETECTED NOT DETECTED Final   Yersinia enterocolitica NOT DETECTED NOT DETECTED Final   Vibrio species NOT DETECTED NOT DETECTED Final   Vibrio cholerae NOT DETECTED NOT DETECTED Final   Enteroaggregative E coli (EAEC) NOT DETECTED NOT DETECTED Final   Enteropathogenic E coli (EPEC) NOT DETECTED NOT DETECTED Final   Enterotoxigenic E coli (ETEC) NOT DETECTED NOT DETECTED Final   Shiga like toxin producing E coli (STEC) NOT DETECTED NOT DETECTED Final   Shigella/Enteroinvasive E coli (EIEC) NOT DETECTED NOT DETECTED Final   Cryptosporidium NOT DETECTED NOT DETECTED Final   Cyclospora cayetanensis NOT DETECTED NOT DETECTED Final   Entamoeba histolytica NOT DETECTED NOT DETECTED Final   Giardia lamblia NOT DETECTED NOT DETECTED Final   Adenovirus F40/41 NOT DETECTED NOT DETECTED Final   Astrovirus NOT DETECTED NOT DETECTED Final   Norovirus GI/GII NOT DETECTED NOT DETECTED Final   Rotavirus A NOT DETECTED NOT DETECTED Final   Sapovirus (I, II, IV,  and V) NOT DETECTED NOT DETECTED Final    Comment: Performed at [facility], [address], [place], [place] [address]       Radiology Studies: Dg Chest 2 View  Result Date: 11/11/2017 CLINICAL DATA:  Initial evaluation for acute fever, weakness. History of pancreatic cancer. EXAM: CHEST  - 2 VIEW COMPARISON:  Prior radiograph from 10/25/2017. FINDINGS: Right-sided Port-A-Cath in place, stable. Cardiac and mediastinal silhouettes are stable in size and contour, and remain within normal limits. Lungs mildly hypoinflated. Mild left basilar scarring, similar to previous. There is diffusely increased prominence of the interstitial markings as compared to previous, which may reflect mild interstitial congestion or possibly atypical infection. No consolidative opacity to suggest alveolar pneumonia. No frank pulmonary edema. No pleural effusion. No pneumothorax. No acute osseus abnormality. Metallic biliary stent overlies the right upper quadrant. IMPRESSION: 1. Mild diffuse increased prominence of the interstitial markings, which may reflect mild interstitial congestion or possibly atypical infection. 2. No consolidative opacity to suggest pneumonia. Electronically Signed   By: Jeannine Boga M.D.   On: 11/11/2017 23:05   Ct Abdomen Pelvis W Contrast  Result Date: 11/11/2017 CLINICAL DATA:  52 year old female with history of metastatic pancreatic cancer. EXAM: CT ABDOMEN AND PELVIS WITH CONTRAST TECHNIQUE: Multidetector CT imaging of the abdomen and pelvis was performed using the standard protocol following bolus administration of intravenous contrast. CONTRAST:  152mL ISOVUE-300 IOPAMIDOL (ISOVUE-300) INJECTION 61%, 87mL ISOVUE-300 IOPAMIDOL (ISOVUE-300) INJECTION 61% COMPARISON:  Abdominal CT dated 10/25/2017 FINDINGS: Lower chest: Minimal bibasilar dependent atelectatic changes. The visualized lung bases are otherwise clear. The tip of a central venous line is visualized in the right atrium. No intra-abdominal free air.  There is a small amount of ascites. Hepatobiliary: Multiple hepatic hypodense lesions with the largest lesion measuring 2.4 x 1.8 cm in segment IVA/VIII as seen previously consistent with metastatic disease. There is pneumobilia. A Wallstent is noted in the CBD. Pancreas:  Ill-defined infiltrative mass in the head of the pancreas measures 6.3 x 4.3 cm. There is atrophy of the body and tail of the pancreas with dilatation of the main pancreatic duct. Spleen: Mild splenomegaly measuring up to 17 cm in length. Adrenals/Urinary Tract: The adrenal glands are unremarkable. There is mild left hydronephrosis similar to prior CT. The right kidney is unremarkable. The urinary bladder is partially distended and appears unremarkable. Stomach/Bowel: There is inflammatory changes and thickening of multiple jejunal loops as well as mild diffuse thickening of the ileal loops and terminal ileum. There is inflammatory changes of the proximal half of the colon. Findings most consistent with an enterocolitis. Clinical correlation is recommended. There is no bowel obstruction. The appendix is normal. Vascular/Lymphatic: The abdominal aorta and IVC appear unremarkable. There is encasement of the celiac axis and proximal SMA by the infiltrative pancreatic tumor. 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 A 13 x 28 mm heterogeneously enhancing nodule along the right paracolic gutter consistent with metastatic disease, appears slightly larger compared to prior CT. Reproductive: The uterus is anteverted and grossly unremarkable. Other: None Musculoskeletal: Left sacral and small left femoral head sclerotic foci most consistent with bone island. No acute osseous pathology. Sclerotic changes of posterior element of T12 similar to prior CTs. IMPRESSION: 1. Findings most consistent with enterocolitis. Correlation with clinical exam recommended. No bowel obstruction. Normal appendix. 2. Infiltrative pancreatic head mass consistent with known malignancy. Multiple hepatic metastatic disease not significantly changed since the prior CT. 3. Slight  interval increase in the size of the right paracolic metastatic nodule. 4. Mild left hydronephrosis similar to prior CT.  5. Diffuse mesenteric edema and small ascites. Electronically Signed   By: Anner Crete M.D.   On: 11/11/2017 23:24      Scheduled Meds: . morphine  15 mg Oral BID  . potassium chloride  40 mEq Oral Q4H   Continuous Infusions: . sodium chloride 75 mL/hr at 11/12/17 1241  . piperacillin-tazobactam (ZOSYN)  IV 3.375 g (11/13/17 1318)     LOS: 2 days    Time spent: 30 minutes   Dessa Phi, DO [facility] [email or web address] Password [email or web address] 11/13/2017, 1:55 PM

## 2017-11-13 NOTE — Progress Notes (Signed)
[name] Gastroenterology Progress Note  Crystal Carlson 52 y.o. Nov 04, 1965  CC:  Abnormal LFTs, diarrhea   Subjective: patient denied abdominal pain, nausea and vomiting. No bowel movement since yesterday evening. Feeling somewhat better.     Objective: Vital signs in last 24 hours: Vitals:   11/12/17 2217 11/13/17 0524  BP: 118/85 128/83  Pulse: 91 88  Resp: 20 20  Temp: 98.7 F (37.1 C) 99.2 F (37.3 C)  SpO2: 100% 100%    Physical Exam:  Gen. Alert and oriented 3. Not in acute distress Abdomen. Soft, nontender, nondistended, bowel sounds present. No peritoneal signs  Lab Results: Recent Labs    11/12/17 0427 11/13/17 0426  NA 138 142  K 3.6 2.9*  CL 105 110  CO2 24 26  GLUCOSE 98 90  BUN 10 9  CREATININE 0.62 0.68  CALCIUM 8.5* 8.2*  MG 1.8  --   PHOS 3.3  --    Recent Labs    11/12/17 0427 11/13/17 0426  AST 91* 45*  ALT 64* 44  ALKPHOS 382* 279*  BILITOT 1.8* 0.7  PROT 6.2* 5.5*  ALBUMIN 2.7* 2.3*   Recent Labs    11/11/17 1450 11/12/17 0427 11/13/17 0426  WBC 3.3* 2.6* 2.4*  NEUTROABS 3.0  --   --   HGB  --  8.1* 7.1*  HCT 28.2* 25.7* 22.4*  MCV 84.7 89.2 88.2  PLT 55* 44* 50*   Recent Labs    11/11/17 1931  LABPROT 16.6*  INR 1.35      Assessment/Plan: - Mild elevated LFTs. Rending down. T bili normal now. - elevated alkaline phosphatase is most likely from diffuse liver metastasis - fever with CT scan showing possible enterocolitis.no bowel movement since yesterday evening - Metastatic pancreatic cancer. Status post recent chemotherapy. - Umbilical discoloration and tenderness.  Recommendations ------------------------- - patient LFTs trending down. Total bilirubin normal. Biliary stent is patent and in good position. - Diarrhea is resolved. - Further management per oncology and primary team. - GI will sign off. Call us back if needed      Otis Brace MD, [place] 11/13/2017, 9:23 AM  Contact #  [phone]

## 2017-11-13 NOTE — Progress Notes (Signed)
Spoke with pt and mother at bedside concerning problem picking up medications and other needs. 1st this CM asked if the pt understood english and spoke english well. Pt and mother states she do not have a problem getting her medications and have no HH needs at present time.

## 2017-11-13 NOTE — Progress Notes (Addendum)
IP PROGRESS NOTE  Subjective:   Crystal Carlson denies nausea and pain.  She had 3 episodes of diarrhea yesterday. Objective: Vital signs in last 24 hours: Blood pressure 128/83, pulse 88, temperature 99.2 F (37.3 C), temperature source Oral, resp. rate 20, height 5\' 5"  (1.651 m), weight 114 lb 10.2 oz (52 kg), SpO2 100 %.  Intake/Output from previous day: 03/28 0701 - 03/29 0700 In: 1588.8 [P.O.:240; I.V.:998.8; IV Piggyback:350] Out: -   Physical Exam:  HEENT: No thrush or ulcers Abdomen: No hepatosplenomegaly, no mass, nontender Extremities: No leg edema Skin: Resolving rash at the upper anterior chest, waistline, and lower legs   Portacath/PICC-without erythema or tenderness  Lab Results: Recent Labs    11/12/17 0427 11/13/17 0426  WBC 2.6* 2.4*  HGB 8.1* 7.1*  HCT 25.7* 22.4*  PLT 44* 50*  11/11/2017: ANC 3.0  BMET Recent Labs    11/12/17 0427 11/13/17 0426  NA 138 142  K 3.6 2.9*  CL 105 110  CO2 24 26  GLUCOSE 98 90  BUN 10 9  CREATININE 0.62 0.68  CALCIUM 8.5* 8.2*    Lab Results  Component Value Date   CEA1 1.3 05/25/2017    Studies/Results: Dg Chest 2 View  Result Date: 11/11/2017 CLINICAL DATA:  Initial evaluation for acute fever, weakness. History of pancreatic cancer. EXAM: CHEST - 2 VIEW COMPARISON:  Prior radiograph from 10/25/2017. FINDINGS: Right-sided Port-A-Cath in place, stable. Cardiac and mediastinal silhouettes are stable in size and contour, and remain within normal limits. Lungs mildly hypoinflated. Mild left basilar scarring, similar to previous. There is diffusely increased prominence of the interstitial markings as compared to previous, which may reflect mild interstitial congestion or possibly atypical infection. No consolidative opacity to suggest alveolar pneumonia. No frank pulmonary edema. No pleural effusion. No pneumothorax. No acute osseus abnormality. Metallic biliary stent overlies the right upper quadrant. IMPRESSION: 1. Mild  diffuse increased prominence of the interstitial markings, which may reflect mild interstitial congestion or possibly atypical infection. 2. No consolidative opacity to suggest pneumonia. Electronically Signed   By: Jeannine Boga M.D.   On: 11/11/2017 23:05   Ct Abdomen Pelvis W Contrast  Result Date: 11/11/2017 CLINICAL DATA:  52 year old female with history of metastatic pancreatic cancer. EXAM: CT ABDOMEN AND PELVIS WITH CONTRAST TECHNIQUE: Multidetector CT imaging of the abdomen and pelvis was performed using the standard protocol following bolus administration of intravenous contrast. CONTRAST:  16mL ISOVUE-300 IOPAMIDOL (ISOVUE-300) INJECTION 61%, 70mL ISOVUE-300 IOPAMIDOL (ISOVUE-300) INJECTION 61% COMPARISON:  Abdominal CT dated 10/25/2017 FINDINGS: Lower chest: Minimal bibasilar dependent atelectatic changes. The visualized lung bases are otherwise clear. The tip of a central venous line is visualized in the right atrium. No intra-abdominal free air.  There is a small amount of ascites. Hepatobiliary: Multiple hepatic hypodense lesions with the largest lesion measuring 2.4 x 1.8 cm in segment IVA/VIII as seen previously consistent with metastatic disease. There is pneumobilia. A Wallstent is noted in the CBD. Pancreas: Ill-defined infiltrative mass in the head of the pancreas measures 6.3 x 4.3 cm. There is atrophy of the body and tail of the pancreas with dilatation of the main pancreatic duct. Spleen: Mild splenomegaly measuring up to 17 cm in length. Adrenals/Urinary Tract: The adrenal glands are unremarkable. There is mild left hydronephrosis similar to prior CT. The right kidney is unremarkable. The urinary bladder is partially distended and appears unremarkable. Stomach/Bowel: There is inflammatory changes and thickening of multiple jejunal loops as well as mild diffuse thickening of the  ileal loops and terminal ileum. There is inflammatory changes of the proximal half of the colon.  Findings most consistent with an enterocolitis. Clinical correlation is recommended. There is no bowel obstruction. The appendix is normal. Vascular/Lymphatic: The abdominal aorta and IVC appear unremarkable. There is encasement of the celiac axis and proximal SMA by the infiltrative pancreatic tumor. 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 A 13 x 28 mm heterogeneously enhancing nodule along the right paracolic gutter consistent with metastatic disease, appears slightly larger compared to prior CT. Reproductive: The uterus is anteverted and grossly unremarkable. Other: None Musculoskeletal: Left sacral and small left femoral head sclerotic foci most consistent with bone island. No acute osseous pathology. Sclerotic changes of posterior element of T12 similar to prior CTs. IMPRESSION: 1. Findings most consistent with enterocolitis. Correlation with clinical exam recommended. No bowel obstruction. Normal appendix. 2. Infiltrative pancreatic head mass consistent with known malignancy. Multiple hepatic metastatic disease not significantly changed since the prior CT. 3. Slight interval increase in the size of the right paracolic metastatic nodule. 4. Mild left hydronephrosis similar to prior CT. 5. Diffuse mesenteric edema and small ascites. Electronically Signed   By: Anner Crete M.D.   On: 11/11/2017 23:24    Medications: I have reviewed the patient's current medications.  Assessment/Plan:  1. 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 Adenocarcinoma consistent with pancreatobiliary primary.  Cycle 1 FOLFIRINOX 06/18/2017  Cycle 2FOLFIRINOX 07/02/2017  Cycle 3 FOLFIRINOX 07/16/2017 (oxaliplatin dose reduced due to mild thrombocytopenia)  CT 07/24/2017-new splenomegaly and left hydronephrosis  Cycle 4 FOLFIRINOX 08/08/2017  Cycle 5 FOLFIRINOX 08/24/2017  CT abdomen/pelvis 09/03/2017-stable liver lesions and pancreas mass, stable right biliary dilatation, possible new 8 mm right liver lesion versus a vascular phenomena, lesion at the right paracolic gutter was present in the past-significance unclear  Cycle 6 FOLFIRINOX1/29/2019  Cycle7 FOLFIRINOX2/19/2019 (Irinotecan andOxaliplatin dose reduced due to thrombocytopenia)  CT abdomen/pelvis 10/25/2017- findings concerning for enterocolitis. No bowel obstruction. Ill-defined pancreatic head mass. Multiple hepatic hypoenhancing lesions with the largest measuring 1.9 x 2.0 cm, previously 1.7 x 1.7 cm; 9 x 10 mm hypoenhancing lesion in the inferior right lobe of the liver slightly larger compared to the previous CT; 1.6 x 2.4 cm enhancing lesion in the right paracolic gutter increased in size compared to the prior CT.  Cycle 1 gemcitabine/Abraxane 11/04/2017  2. Pain secondary to #1.Improved.  3. Weight losssecondary to #1.  4. Microcytic anemia  Transfused with 1 unit of packed red blood cells 07/25/2017  5. Thrombocytopenia secondary to chemotherapy  6. Port-A-Cath placement 06/04/2017  7. Elevated liver enzymes/bilirubin10/22/2018-likely early biliary obstruction secondary to pancreas cancer, status post ERCP-placement of a common bile duct stent 06/09/2018  8.Postprandial abdominal pain. Protonix initiated 07/16/2017.Trial of Reglan initiated 08/24/2017.  9.Admission with fever 07/24/2017-no source for infection identified, resolved   10.Diarrheafollowing cycle 3 FOLFIRINOX-resolved  11.Left  hydronephrosis noted on CT12/03/2017-etiology unclear, urology consulted-stent not recommended.Mild left hydronephrosis noted on CT 10/25/2017. Creatinine normal 10/27/2017.  12. Admission 08/31/2017 with a high fever, blood culture positive for gram-negative rods-Klebsiella pneumoniae identified  13. Anemia secondary to chemotherapy, chronic disease,  and sepsis-status post transfusion with packed red blood cells 09/02/2017  14.Fever 10/25/2017 status post evaluation in the emergency department. CT with findings concerning for enterocolitis.   15.  Admission 11/11/2017 with a high fever- blood cultures (peripheral and Port-A-Cath) positive for gram-negative rods  16.  Rash-potentially secondary to gemcitabine or infection, improved  17.  Pancytopenia secondary to chemotherapy  CrystalCarlson was admitted with a high fever.  Blood cultures from admission are positive for gram-negative rods.  The bacteremia is likely related to translocation from the pancreas mass or biliary stent.    She has pancytopenia secondary to chemotherapy.  The anemia is also secondary to chronic disease, and phlebotomy.  The platelet count and white count are stable.  She has severe anemia.    Recommendations: 1.  Daily CBC/differential 2.  Continue broad-spectrum intravenous antibiotics and follow-up ID/sensitivity on the gram-negative rod 3.  Transfuse packed red blood cells for a hemoglobin of less than 7 4.   Resume oral Dilaudid as needed for pain 5.  Outpatient follow-up at the Cancer center as scheduled on 11/18/2017.  Please call Oncology as needed.  I will check on her on 11/16/2017 if she remains in the hospital.   LOS: 2 days   Betsy Coder, MD   11/13/2017, 7:18 AM

## 2017-11-13 NOTE — Progress Notes (Signed)
These preliminary result these preliminary results were noted.  Awaiting final report.

## 2017-11-14 LAB — BASIC METABOLIC PANEL
Anion gap: 7 (ref 5–15)
BUN: 5 mg/dL — ABNORMAL LOW (ref 6–20)
CALCIUM: 8.3 mg/dL — AB (ref 8.9–10.3)
CO2: 24 mmol/L (ref 22–32)
CREATININE: 0.64 mg/dL (ref 0.44–1.00)
Chloride: 110 mmol/L (ref 101–111)
Glucose, Bld: 93 mg/dL (ref 65–99)
Potassium: 3.3 mmol/L — ABNORMAL LOW (ref 3.5–5.1)
SODIUM: 141 mmol/L (ref 135–145)

## 2017-11-14 LAB — CBC
HCT: 23.1 % — ABNORMAL LOW (ref 36.0–46.0)
HEMOGLOBIN: 7.3 g/dL — AB (ref 12.0–15.0)
MCH: 28.1 pg (ref 26.0–34.0)
MCHC: 31.6 g/dL (ref 30.0–36.0)
MCV: 88.8 fL (ref 78.0–100.0)
Platelets: 54 10*3/uL — ABNORMAL LOW (ref 150–400)
RBC: 2.6 MIL/uL — ABNORMAL LOW (ref 3.87–5.11)
RDW: 16.9 % — AB (ref 11.5–15.5)
WBC: 2.2 10*3/uL — AB (ref 4.0–10.5)

## 2017-11-14 MED ORDER — SULFAMETHOXAZOLE-TRIMETHOPRIM 800-160 MG PO TABS: 1.0000 | ORAL_TABLET | Freq: Two times a day (BID) | ORAL | 0 refills | Status: AC

## 2017-11-14 MED ORDER — POTASSIUM CHLORIDE CRYS ER 20 MEQ PO TBCR
40.0000 meq | EXTENDED_RELEASE_TABLET | Freq: Once | ORAL | Status: AC
  Administered 2017-11-14: 40 meq via ORAL
  Filled 2017-11-14: qty 2

## 2017-11-14 MED ORDER — SULFAMETHOXAZOLE-TRIMETHOPRIM 800-160 MG PO TABS: 1.0000 | ORAL_TABLET | Freq: Two times a day (BID) | ORAL | 0 refills | Status: DC

## 2017-11-14 MED ORDER — HEPARIN SOD (PORK) LOCK FLUSH 100 UNIT/ML IV SOLN
500.0000 [IU] | INTRAVENOUS | Status: AC | PRN
  Administered 2017-11-14: 500 [IU]

## 2017-11-14 NOTE — Discharge Summary (Addendum)
Physician Discharge Summary  Crystal Carlson ZOX:[id number] DOB: March 18, 1966 DOA: 11/11/2017  PCP: Mack Hook, MD  Admit date: 11/11/2017 Discharge date: 11/14/2017  Admitted From: Home Disposition:  Home   Recommendations for Outpatient Follow-up:  1. Follow up with PCP in 1 week 2. Follow up with at the [facility] as previously scheduled on 11/18/2017  3. Please obtain BMP/CBC in 1 week to repeat potassium level (hypokalemia replaced prior to discharge) as well as Cr while on Bactrim  4. TSH just outside of normal range at 0.314 and free T4 1.16. Will likely need repeat labs and outpatient radioiodine uptake scan as outpatient for further evaluation once acute illness resolves  Discharge Condition: Stable CODE STATUS: Full  Diet recommendation: Regular diet   Brief/Interim Summary: Crystal Carlson is a 52 yo female with past medical history significant for metastatic pancreatic cancer who currently follows with Dr. Benay Spice.  She presented to her oncology office on 3/27 with complaints of fevers and chills.  She had no complaints of chest pain, shortness of breath, cough, abdominal pain, nausea, vomiting, diarrhea.  Her fever was as high as 101.8 at home.  Labs were obtained in the office which revealed elevated LFT, elevated bilirubin level.  She was directly admitted to [facility] for further evaluation of her fever and questionable biliary stent patency.  Blood cultures were drawn and patient was started on broad-spectrum antibiotics. She was treated with IV vanco/zosyn. Work up was negative for pneumonia, UTI. GI PCR was negative. Blood culture returned positive for stenotrophomonas maltophilia. Case was discussed over the phone with Dr. Megan Salon, Infectious Disease. He recommended Septra DS BID for 5 day course. On day of discharge, patient was feeling well and has been afebrile.   Discharge Diagnoses:  Principal Problem:   Sepsis ([place]) Active Problems:   Prediabetes   HTN  (hypertension)   Elevated LFTs   Pancreatic cancer metastasized to liver [facility])   Chronic pain due to malignant neoplastic disease   Encounter for antineoplastic chemotherapy   Protein-calorie malnutrition, moderate (HCC)   Thrombocytopenia (HCC)  Sepsis secondary to stenotrophomonas maltophilia bacteremia  -CXR: No consolidative opacity to suggest pneumonia -Respiratory viral panel negative  -Urine culture negative  -GI PCR negative  -CT abdomen pelvis revealed enterocolitis, although she has no clinical abdominal pain, nausea, vomiting, diarrhea. Stent and biliary tract appear to be patent -Blood cultures positive for stenotrophomonas maltophilia, sensitive to bactrim -Case was discussed over the phone with Dr. Megan Salon, Infectious Disease. He recommended Septra DS BID for 5 day course  Elevated LFT  -CT abdomen pelvis revealed enterocolitis, stent and biliary tract appear to be patent -LFT trending down -GI consulted, no further recommendations as biliary stent is patent and in good position  Metastatic pancreatic cancer -Follows with Dr. Benay Spice   Prediabetes -A1c 5.4   Pancytopenia -Possibly chemotherapy-induced -Monitor for acute bleeding  Low TSH -TSH just outside of normal range at 0.314 and free T4 1.16. Will likely need repeat labs and outpatient radioiodine uptake scan as outpatient for further evaluation once acute illness resolves  Hypokalemia -Replace, trend     Discharge Instructions  Discharge Instructions    Call MD for:  difficulty breathing, headache or visual disturbances   Complete by:  As directed    Call MD for:  extreme fatigue   Complete by:  As directed    Call MD for:  hives   Complete by:  As directed    Call MD for:  persistant dizziness or light-headedness  Complete by:  As directed    Call MD for:  persistant nausea and vomiting   Complete by:  As directed    Call MD for:  severe uncontrolled pain   Complete by:  As  directed    Call MD for:  temperature >100.4   Complete by:  As directed    Diet general   Complete by:  As directed    Discharge instructions   Complete by:  As directed    You were cared for by a hospitalist during your hospital stay. If you have any questions about your discharge medications or the care you received while you were in the hospital after you are discharged, you can call the unit and asked to speak with the hospitalist on call if the hospitalist that took care of you is not available. Once you are discharged, your primary care physician will handle any further medical issues. Please note that NO REFILLS for any discharge medications will be authorized once you are discharged, as it is imperative that you return to your primary care physician (or establish a relationship with a primary care physician if you do not have one) for your aftercare needs so that they can reassess your need for medications and monitor your lab values.   Increase activity slowly   Complete by:  As directed      Allergies as of 11/14/2017   No Known Allergies     Medication List    STOP taking these medications   fluconazole 100 MG tablet Commonly known as:  DIFLUCAN     TAKE these medications   acetaminophen 325 MG tablet Commonly known as:  TYLENOL Take 650 mg by mouth every 6 (six) hours as needed.   cetirizine 10 MG tablet Commonly known as:  ZYRTEC Take 10 mg by mouth See admin instructions. Takes for 3 days follow chemotherapy   dexamethasone 4 MG tablet Commonly known as:  DECADRON TK 1 T PO BID FOR 3 DAYS AFTER EACH CYCLE OF CHEMO   ferrous sulfate 325 (65 FE) MG EC tablet Take 1 tablet (325 mg total) by mouth 3 (three) times daily with meals.   hydrocortisone 2.5 % rectal cream Commonly known as:  ANUSOL-HC Apply 1 application topically 4 (four) times daily as needed for hemorrhoids.   hydrocortisone cream 1 % Apply 1 application topically 3 (three) times daily as needed  for itching (minor skin irritation).   HYDROmorphone 4 MG tablet Commonly known as:  DILAUDID Take 1-2 tablets (4-8 mg total) by mouth every 4 (four) hours as needed for severe pain.   lidocaine-prilocaine cream Commonly known as:  EMLA Apply to port site one hour prior to use. Do not rub in. Cover with plastic. What changed:    how much to take  how to take this  when to take this  reasons to take this  additional instructions   loperamide 2 MG capsule Commonly known as:  IMODIUM Take 1 capsule (2 mg total) by mouth as needed for diarrhea or loose stools (max 8 tabs per day).   LORazepam 0.5 MG tablet Commonly known as:  ATIVAN Take 1 tablet (0.5 mg total) by mouth every 8 (eight) hours as needed for anxiety (or nausea).   metoCLOPramide 5 MG tablet Commonly known as:  REGLAN Take 1 tablet (5 mg total) by mouth 3 (three) times daily before meals.   morphine 15 MG 12 hr tablet Commonly known as:  MS CONTIN Take 15 mg by  mouth 2 (two) times daily as needed.   ondansetron 4 MG disintegrating tablet Commonly known as:  ZOFRAN ODT Take 1 tablet (4 mg total) by mouth every 8 (eight) hours as needed for nausea or vomiting.   potassium chloride SA 20 MEQ tablet Commonly known as:  K-DUR,KLOR-CON Take 1 tablet (20 mEq total) by mouth daily.   prochlorperazine 10 MG tablet Commonly known as:  COMPAZINE Take 1 tablet (10 mg total) by mouth every 6 (six) hours as needed for nausea or vomiting.   sulfamethoxazole-trimethoprim 800-160 MG tablet Commonly known as:  BACTRIM DS,SEPTRA DS Take 1 tablet by mouth 2 (two) times daily for 5 days.      Follow-up Information    Mack Hook, MD. Schedule an appointment as soon as possible for a visit in 1 week(s).   Specialty:  Internal Medicine Contact information: [place] [address] [phone]        Ladell Pier, MD. Go on 11/17/2017.   Specialty:  Oncology Contact information: [place] [address] [phone]          No Known Allergies  Consultations:  Oncology  GI    Procedures/Studies: Dg Chest 2 View  Result Date: 11/11/2017 CLINICAL DATA:  Initial evaluation for acute fever, weakness. History of pancreatic cancer. EXAM: CHEST - 2 VIEW COMPARISON:  Prior radiograph from 10/25/2017. FINDINGS: Right-sided Port-A-Cath in place, stable. Cardiac and mediastinal silhouettes are stable in size and contour, and remain within normal limits. Lungs mildly hypoinflated. Mild left basilar scarring, similar to previous. There is diffusely increased prominence of the interstitial markings as compared to previous, which may reflect mild interstitial congestion or possibly atypical infection. No consolidative opacity to suggest alveolar pneumonia. No frank pulmonary edema. No pleural effusion. No pneumothorax. No acute osseus abnormality. Metallic biliary stent overlies the right upper quadrant. IMPRESSION: 1. Mild diffuse increased prominence of the interstitial markings, which may reflect mild interstitial congestion or possibly atypical infection. 2. No consolidative opacity to suggest pneumonia. Electronically Signed   By: Jeannine Boga M.D.   On: 11/11/2017 23:05   Dg Chest 2 View  Result Date: 10/25/2017 CLINICAL DATA:  Fever for the past 2 days. History of pancreatic cancer. EXAM: CHEST - 2 VIEW COMPARISON:  Chest x-ray dated August 31, 2017. FINDINGS: Unchanged right chest wall port catheter with the tip in the SVC. The heart size and mediastinal contours are within normal limits. Normal pulmonary vascularity. No focal consolidation, pleural effusion, or pneumothorax. No acute osseous abnormality. Unchanged common bile duct stent. IMPRESSION: No active cardiopulmonary disease. Electronically Signed   By: Titus Dubin M.D.   On: 10/25/2017 16:39   Ct Abdomen Pelvis W Contrast  Result Date: 11/11/2017 CLINICAL DATA:  52 year old female with  history of metastatic pancreatic cancer. EXAM: CT ABDOMEN AND PELVIS WITH CONTRAST TECHNIQUE: Multidetector CT imaging of the abdomen and pelvis was performed using the standard protocol following bolus administration of intravenous contrast. CONTRAST:  123mL ISOVUE-300 IOPAMIDOL (ISOVUE-300) INJECTION 61%, 76mL ISOVUE-300 IOPAMIDOL (ISOVUE-300) INJECTION 61% COMPARISON:  Abdominal CT dated 10/25/2017 FINDINGS: Lower chest: Minimal bibasilar dependent atelectatic changes. The visualized lung bases are otherwise clear. The tip of a central venous line is visualized in the right atrium. No intra-abdominal free air.  There is a small amount of ascites. Hepatobiliary: Multiple hepatic hypodense lesions with the largest lesion measuring 2.4 x 1.8 cm in segment IVA/VIII as seen previously consistent with metastatic disease. There is pneumobilia. A Wallstent  is noted in the CBD. Pancreas: Ill-defined infiltrative mass in the head of the pancreas measures 6.3 x 4.3 cm. There is atrophy of the body and tail of the pancreas with dilatation of the main pancreatic duct. Spleen: Mild splenomegaly measuring up to 17 cm in length. Adrenals/Urinary Tract: The adrenal glands are unremarkable. There is mild left hydronephrosis similar to prior CT. The right kidney is unremarkable. The urinary bladder is partially distended and appears unremarkable. Stomach/Bowel: There is inflammatory changes and thickening of multiple jejunal loops as well as mild diffuse thickening of the ileal loops and terminal ileum. There is inflammatory changes of the proximal half of the colon. Findings most consistent with an enterocolitis. Clinical correlation is recommended. There is no bowel obstruction. The appendix is normal. Vascular/Lymphatic: The abdominal aorta and IVC appear unremarkable. There is encasement of the celiac axis and proximal SMA by the infiltrative pancreatic tumor. 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 A 13 x 28 mm heterogeneously enhancing nodule along the right paracolic gutter consistent with metastatic disease, appears slightly larger compared to prior CT. Reproductive: The uterus is anteverted and grossly unremarkable. Other: None Musculoskeletal: Left sacral and small left femoral head sclerotic foci most consistent with bone island. No acute osseous pathology. Sclerotic changes of posterior element of T12 similar to prior CTs. IMPRESSION: 1. Findings most consistent with enterocolitis. Correlation with clinical exam recommended. No bowel obstruction. Normal appendix. 2. Infiltrative pancreatic head mass consistent with known malignancy. Multiple hepatic metastatic disease not significantly changed since the prior CT. 3. Slight interval increase in the size of the right paracolic metastatic nodule. 4. Mild left hydronephrosis similar to prior CT. 5. Diffuse mesenteric edema and small ascites. Electronically Signed   By: Anner Crete M.D.   On: 11/11/2017 23:24   Ct Abdomen Pelvis W Contrast  Result Date: 10/25/2017 CLINICAL DATA:  52 year old female with stage IV pancreatic cancer and high fever abdominal pain. Concern for abscess. EXAM: CT ABDOMEN AND PELVIS WITH CONTRAST TECHNIQUE: Multidetector CT imaging of the abdomen and pelvis was performed using the standard protocol following bolus administration of intravenous contrast. CONTRAST:  166mL ISOVUE-300 IOPAMIDOL (ISOVUE-300) INJECTION 61% COMPARISON:  Abdominal CT dated 09/03/2017 FINDINGS: Lower chest: Bibasilar linear atelectasis/scarring. Probable trace pericardial effusion. No intra-abdominal free air. There is diffuse mesenteric edema and small ascites. Hepatobiliary: There are multiple hypoenhancing liver lesions with the largest measuring 1.9 x 2.0 cm (previously 1.7 x 1.7 cm) segment IVA. This lesion is not well characterized but concerning for metastatic disease. A 9 x 10 mm hypoenhancing lesion in the  inferior right lobe of the liver (series 2, image 35) appears slightly larger compared to prior CT and is suspicious for metastatic disease. Faint areas of heterogeneous and decreased enhancement in the central liver. There is pneumobilia. A wall stent is in place and appears patent. There is air within the gallbladder. No calcified stone. Pancreas: Ill-defined hypoenhancing pancreatic head mass again noted. The region of the head of the pancreas measure approximately 5.6 x 4.8 cm on series 2, image 28). There is atrophy of the pancreas with dilatation of the main pancreatic duct. There is infiltration of the peripancreatic fat. Spleen: The spleen is enlarged measuring approximately 18 cm in length. Adrenals/Urinary Tract: The adrenal glands are grossly unremarkable as visualized. Bilateral extrarenal pelvis. There is mild left hydronephrosis, new compared to the prior CT. The urinary bladder is distended and grossly unremarkable. Stomach/Bowel: There is extensive inflammatory  changes and edema of the duodenal C-loop as well as thickening of the jejunal folds. There is thickened and edematous appearance of the colon primarily involving the proximal portion. Although this appearance may be related to ascites, findings concerning for enterocolitis. Correlation with clinical exam recommended. There is no bowel obstruction. The appendix is normal. Vascular/Lymphatic: The abdominal aorta and IVC appear unremarkable. The main portal vein is not visualized. There is cavernous transformation in the region of the porta hepaticus. There is infiltration of the fat surrounding the celiac axis, SMA, and IMA. Reproductive: The uterus and ovaries are grossly unremarkable. Other: There is a 1.6 x 2.4 cm enhancing lesion in the right paracolic gutter which has increased in size compared to the prior CT (previously measuring 13 x 13 mm) most consistent with metastatic disease. Musculoskeletal: No acute osseous pathology. IMPRESSION:  1. Findings concerning for enterocolitis. Clinical correlation is recommended. No bowel obstruction. Normal appendix. 2. Ill-defined pancreatic head mass consistent with known malignancy. 3. Multiple hepatic hypodense/hypoenhancing lesions which are not characterized with certainty on this CT but are concerning for metastatic disease. 4. Right paracolic metastatic nodule with increased size compared to prior CT. 5. Mild left hydronephrosis, new since the prior CT. 6. Diffuse mesenteric edema and small ascites. 7. No drainable fluid collection or abscess as clinically concerned. 8. Splenomegaly. Electronically Signed   By: Anner Crete M.D.   On: 10/25/2017 19:06      Discharge Exam: Vitals:   11/13/17 2100 11/14/17 0448  BP: 113/86 112/78  Pulse: 86 65  Resp: 18 18  Temp: 99.2 F (37.3 C) 98.8 F (37.1 C)  SpO2: 100% 100%    General: Pt is alert, awake, not in acute distress Cardiovascular: RRR, S1/S2 +, no rubs, no gallops Respiratory: CTA bilaterally, no wheezing, no rhonchi Abdominal: Soft, NT, ND, bowel sounds + Extremities: no edema, no cyanosis    The results of significant diagnostics from this hospitalization (including imaging, microbiology, ancillary and laboratory) are listed below for reference.     Microbiology: Recent Results (from the past 240 hour(s))  Culture, Blood     Status: Abnormal (Preliminary result)   Collection Time: 11/11/17  2:48 PM  Result Value Ref Range Status   Specimen Description BLOOD BLOOD LEFT ARM  Final   Special Requests   Final    BOTTLES DRAWN AEROBIC AND ANAEROBIC Blood Culture adequate volume   Culture  Setup Time   Final    GRAM NEGATIVE RODS IN BOTH AEROBIC AND ANAEROBIC BOTTLES CRITICAL RESULT CALLED TO, READ BACK BY AND VERIFIED WITH: N. [place], AT 4401 11/13/17 BY D. VANHOOK    Culture (A)  Final    STENOTROPHOMONAS MALTOPHILIA SUSCEPTIBILITIES PERFORMED ON PREVIOUS CULTURE WITHIN THE LAST 5 DAYS. Performed at  [facility], [place] [address], [place], [place] [address]    Report Status PENDING  Incomplete  Urine Culture     Status: None   Collection Time: 11/11/17  2:50 PM  Result Value Ref Range Status   Specimen Description   Final    URINE, CLEAN CATCH Performed at [facility], [place] [address] [place], [place] [address]    Special Requests   Final    NONE Performed at [facility], [place] [address], [place], [place] [address]    Culture   Final    NO GROWTH Performed at [facility], [place] [address] [place], [place] [address]    Report Status 11/12/2017  FINAL  Final  Culture, Blood     Status: Abnormal (Preliminary result)   Collection Time: 11/11/17  3:50 PM  Result Value Ref Range Status   Specimen Description PORTA CATH  Final   Special Requests   Final    BOTTLES DRAWN AEROBIC AND ANAEROBIC Blood Culture adequate volume   Culture  Setup Time   Final    GRAM NEGATIVE RODS IN BOTH AEROBIC AND ANAEROBIC BOTTLES CRITICAL RESULT CALLED TO, READ BACK BY AND VERIFIED WITH: N. [place], AT 0350 11/13/17 BY D. VANHOOK    Culture STENOTROPHOMONAS MALTOPHILIA (A)  Final   Report Status PENDING  Incomplete   Organism ID, Bacteria STENOTROPHOMONAS MALTOPHILIA  Final      Susceptibility   Stenotrophomonas maltophilia - MIC*    LEVOFLOXACIN 4 INTERMEDIATE Intermediate     TRIMETH/SULFA Value in next row Sensitive      <=20 SENSITIVEPerformed at [facility], [address], [place], [place] [address]    * STENOTROPHOMONAS MALTOPHILIA  Blood Culture ID Panel (Reflexed)     Status: None   Collection Time: 11/11/17  3:50 PM  Result Value Ref Range Status   Enterococcus species NOT DETECTED NOT DETECTED Final   Listeria monocytogenes NOT DETECTED NOT DETECTED Final   Staphylococcus species NOT DETECTED NOT DETECTED Final   Staphylococcus aureus NOT DETECTED NOT DETECTED Final   Streptococcus species NOT DETECTED NOT  DETECTED Final   Streptococcus agalactiae NOT DETECTED NOT DETECTED Final   Streptococcus pneumoniae NOT DETECTED NOT DETECTED Final   Streptococcus pyogenes NOT DETECTED NOT DETECTED Final   Acinetobacter baumannii NOT DETECTED NOT DETECTED Final   Enterobacteriaceae species NOT DETECTED NOT DETECTED Final   Enterobacter cloacae complex NOT DETECTED NOT DETECTED Final   Escherichia coli NOT DETECTED NOT DETECTED Final   Klebsiella oxytoca NOT DETECTED NOT DETECTED Final   Klebsiella pneumoniae NOT DETECTED NOT DETECTED Final   Proteus species NOT DETECTED NOT DETECTED Final   Serratia marcescens NOT DETECTED NOT DETECTED Final   Haemophilus influenzae NOT DETECTED NOT DETECTED Final   Neisseria meningitidis NOT DETECTED NOT DETECTED Final   Pseudomonas aeruginosa NOT DETECTED NOT DETECTED Final   Candida albicans NOT DETECTED NOT DETECTED Final   Candida glabrata NOT DETECTED NOT DETECTED Final   Candida krusei NOT DETECTED NOT DETECTED Final   Candida parapsilosis NOT DETECTED NOT DETECTED Final   Candida tropicalis NOT DETECTED NOT DETECTED Final    Comment: Performed at [facility], [place] [address] [place], [place] [address]  MRSA PCR Screening     Status: None   Collection Time: 11/11/17  8:54 PM  Result Value Ref Range Status   MRSA by PCR NEGATIVE NEGATIVE Final    Comment:        The GeneXpert MRSA Assay (FDA approved for NASAL specimens only), is one component of a comprehensive MRSA colonization surveillance program. It is not intended to diagnose MRSA infection nor to guide or monitor treatment for MRSA infections. Performed at [facility], [place] [address] [place], [place] [address]   Respiratory Panel by PCR     Status: None   Collection Time: 11/11/17  8:54 PM  Result Value Ref Range Status   Adenovirus NOT DETECTED NOT DETECTED Final   Coronavirus 229E NOT DETECTED NOT DETECTED Final   Coronavirus HKU1 NOT DETECTED NOT DETECTED Final    Coronavirus NL63 NOT DETECTED NOT DETECTED Final   Coronavirus OC43 NOT DETECTED NOT DETECTED Final  Metapneumovirus NOT DETECTED NOT DETECTED Final   Rhinovirus / Enterovirus NOT DETECTED NOT DETECTED Final   Influenza A NOT DETECTED NOT DETECTED Final   Influenza B NOT DETECTED NOT DETECTED Final   Parainfluenza Virus 1 NOT DETECTED NOT DETECTED Final   Parainfluenza Virus 2 NOT DETECTED NOT DETECTED Final   Parainfluenza Virus 3 NOT DETECTED NOT DETECTED Final   Parainfluenza Virus 4 NOT DETECTED NOT DETECTED Final   Respiratory Syncytial Virus NOT DETECTED NOT DETECTED Final   Bordetella pertussis NOT DETECTED NOT DETECTED Final   Chlamydophila pneumoniae NOT DETECTED NOT DETECTED Final   Mycoplasma pneumoniae NOT DETECTED NOT DETECTED Final    Comment: Performed at [facility], [place] [address] [place], [place] [address]  Urine Culture     Status: None   Collection Time: 11/12/17  9:54 AM  Result Value Ref Range Status   Specimen Description   Final    URINE, CLEAN CATCH Performed at [facility], [place] [address], [place], [place] [address]    Special Requests   Final    NONE Performed at [facility], [place] [address] [place], [place] [address]    Culture   Final    NO GROWTH Performed at [facility], [place] [address] [place], [place] [address]    Report Status 11/13/2017 FINAL  Final  Gastrointestinal Panel by PCR , Stool     Status: None   Collection Time: 11/12/17 12:40 PM  Result Value Ref Range Status   Campylobacter species NOT DETECTED NOT DETECTED Final   Plesimonas shigelloides NOT DETECTED NOT DETECTED Final   Salmonella species NOT DETECTED NOT DETECTED Final   Yersinia enterocolitica NOT DETECTED NOT DETECTED Final   Vibrio species NOT DETECTED NOT DETECTED Final   Vibrio cholerae NOT DETECTED NOT DETECTED Final   Enteroaggregative E coli (EAEC) NOT DETECTED NOT DETECTED Final   Enteropathogenic E coli  (EPEC) NOT DETECTED NOT DETECTED Final   Enterotoxigenic E coli (ETEC) NOT DETECTED NOT DETECTED Final   Shiga like toxin producing E coli (STEC) NOT DETECTED NOT DETECTED Final   Shigella/Enteroinvasive E coli (EIEC) NOT DETECTED NOT DETECTED Final   Cryptosporidium NOT DETECTED NOT DETECTED Final   Cyclospora cayetanensis NOT DETECTED NOT DETECTED Final   Entamoeba histolytica NOT DETECTED NOT DETECTED Final   Giardia lamblia NOT DETECTED NOT DETECTED Final   Adenovirus F40/41 NOT DETECTED NOT DETECTED Final   Astrovirus NOT DETECTED NOT DETECTED Final   Norovirus GI/GII NOT DETECTED NOT DETECTED Final   Rotavirus A NOT DETECTED NOT DETECTED Final   Sapovirus (I, II, IV, and V) NOT DETECTED NOT DETECTED Final    Comment: Performed at [facility], [place]., [place], [place] [id number]     Labs: BNP (last 3 results) No results for input(s): BNP in the last 8760 hours. Basic Metabolic Panel: Recent Labs  Lab 11/11/17 1450 11/12/17 0427 11/13/17 0426 11/14/17 0407  NA 134* 138 142 141  K 4.1 3.6 2.9* 3.3*  CL 101 105 110 110  CO2 22 24 26 24   GLUCOSE 129 98 90 93  BUN 9 10 9  5*  CREATININE 0.77 0.62 0.68 0.64  CALCIUM 9.3 8.5* 8.2* 8.3*  MG  --  1.8  --   --   PHOS  --  3.3  --   --    Liver Function Tests: Recent Labs  Lab 11/11/17 1450 11/12/17 0427 11/13/17 0426  AST 151* 91* 45*  ALT 94* 64* 44  ALKPHOS 536* 382* 279*  BILITOT 2.3* 1.8* 0.7  PROT 7.1 6.2* 5.5*  ALBUMIN 3.0* 2.7* 2.3*   No results for input(s): LIPASE, AMYLASE in the last 168 hours. No results for input(s): AMMONIA in the last 168 hours. CBC: Recent Labs  Lab 11/11/17 1450 11/12/17 0427 11/13/17 0426 11/14/17 0407  WBC 3.3* 2.6* 2.4* 2.2*  NEUTROABS 3.0  --  1.7  --   HGB  --  8.1* 7.1* 7.3*  HCT 28.2* 25.7* 22.4* 23.1*  MCV 84.7 89.2 88.2 88.8  PLT 55* 44* 50* 54*   Cardiac Enzymes: No results for input(s): CKTOTAL, CKMB, CKMBINDEX, TROPONINI in the last 168  hours. BNP: Invalid input(s): POCBNP CBG: No results for input(s): GLUCAP in the last 168 hours. D-Dimer No results for input(s): DDIMER in the last 72 hours. Hgb A1c Recent Labs    11/12/17 0427  HGBA1C 5.4   Lipid Profile No results for input(s): CHOL, HDL, LDLCALC, TRIG, CHOLHDL, LDLDIRECT in the last 72 hours. Thyroid function studies Recent Labs    11/12/17 0427  TSH 0.314*   Anemia work up No results for input(s): VITAMINB12, FOLATE, FERRITIN, TIBC, IRON, RETICCTPCT in the last 72 hours. Urinalysis    Component Value Date/Time   COLORURINE YELLOW 11/11/2017 2109   APPEARANCEUR CLEAR 11/11/2017 2109   LABSPEC 1.030 11/11/2017 2109   LABSPEC 1.020 07/24/2017 0847   PHURINE 5.0 11/11/2017 2109   GLUCOSEU NEGATIVE 11/11/2017 2109   GLUCOSEU Negative 07/24/2017 0847   HGBUR MODERATE (A) 11/11/2017 2109   BILIRUBINUR NEGATIVE 11/11/2017 2109   BILIRUBINUR Negative 07/24/2017 0847   KETONESUR 20 (A) 11/11/2017 2109   PROTEINUR NEGATIVE 11/11/2017 2109   UROBILINOGEN 0.2 07/24/2017 0847   NITRITE NEGATIVE 11/11/2017 2109   LEUKOCYTESUR NEGATIVE 11/11/2017 2109   LEUKOCYTESUR Negative 07/24/2017 0847   Sepsis Labs Invalid input(s): PROCALCITONIN,  WBC,  LACTICIDVEN Microbiology Recent Results (from the past 240 hour(s))  Culture, Blood     Status: Abnormal (Preliminary result)   Collection Time: 11/11/17  2:48 PM  Result Value Ref Range Status   Specimen Description BLOOD BLOOD LEFT ARM  Final   Special Requests   Final    BOTTLES DRAWN AEROBIC AND ANAEROBIC Blood Culture adequate volume   Culture  Setup Time   Final    GRAM NEGATIVE RODS IN BOTH AEROBIC AND ANAEROBIC BOTTLES CRITICAL RESULT CALLED TO, READ BACK BY AND VERIFIED WITH: N. [place], AT [place] 11/13/17 BY D. VANHOOK    Culture (A)  Final    STENOTROPHOMONAS MALTOPHILIA SUSCEPTIBILITIES PERFORMED ON PREVIOUS CULTURE WITHIN THE LAST 5 DAYS. Performed at [facility], [place] [address]  [place], [place] [address]    Report Status PENDING  Incomplete  Urine Culture     Status: None   Collection Time: 11/11/17  2:50 PM  Result Value Ref Range Status   Specimen Description   Final    URINE, CLEAN CATCH Performed at [facility], [place] [address], [place], [place] [address]    Special Requests   Final    NONE Performed at [facility], [place] [address] [place], [place] [address]    Culture   Final    NO GROWTH Performed at [facility], [place] [address] [place], [place] [address]    Report Status 11/12/2017 FINAL  Final  Culture, Blood     Status: Abnormal (Preliminary result)   Collection Time: 11/11/17  3:50  PM  Result Value Ref Range Status   Specimen Description PORTA CATH  Final   Special Requests   Final    BOTTLES DRAWN AEROBIC AND ANAEROBIC Blood Culture adequate volume   Culture  Setup Time   Final    GRAM NEGATIVE RODS IN BOTH AEROBIC AND ANAEROBIC BOTTLES CRITICAL RESULT CALLED TO, READ BACK BY AND VERIFIED WITH: N. [place], AT 0240 11/13/17 BY D. VANHOOK    Culture STENOTROPHOMONAS MALTOPHILIA (A)  Final   Report Status PENDING  Incomplete   Organism ID, Bacteria STENOTROPHOMONAS MALTOPHILIA  Final      Susceptibility   Stenotrophomonas maltophilia - MIC*    LEVOFLOXACIN 4 INTERMEDIATE Intermediate     TRIMETH/SULFA Value in next row Sensitive      <=20 SENSITIVEPerformed at [facility], [address] [place], [place] [address]    * STENOTROPHOMONAS MALTOPHILIA  Blood Culture ID Panel (Reflexed)     Status: None   Collection Time: 11/11/17  3:50 PM  Result Value Ref Range Status   Enterococcus species NOT DETECTED NOT DETECTED Final   Listeria monocytogenes NOT DETECTED NOT DETECTED Final   Staphylococcus species NOT DETECTED NOT DETECTED Final   Staphylococcus aureus NOT DETECTED NOT DETECTED Final   Streptococcus species NOT DETECTED NOT DETECTED Final   Streptococcus agalactiae  NOT DETECTED NOT DETECTED Final   Streptococcus pneumoniae NOT DETECTED NOT DETECTED Final   Streptococcus pyogenes NOT DETECTED NOT DETECTED Final   Acinetobacter baumannii NOT DETECTED NOT DETECTED Final   Enterobacteriaceae species NOT DETECTED NOT DETECTED Final   Enterobacter cloacae complex NOT DETECTED NOT DETECTED Final   Escherichia coli NOT DETECTED NOT DETECTED Final   Klebsiella oxytoca NOT DETECTED NOT DETECTED Final   Klebsiella pneumoniae NOT DETECTED NOT DETECTED Final   Proteus species NOT DETECTED NOT DETECTED Final   Serratia marcescens NOT DETECTED NOT DETECTED Final   Haemophilus influenzae NOT DETECTED NOT DETECTED Final   Neisseria meningitidis NOT DETECTED NOT DETECTED Final   Pseudomonas aeruginosa NOT DETECTED NOT DETECTED Final   Candida albicans NOT DETECTED NOT DETECTED Final   Candida glabrata NOT DETECTED NOT DETECTED Final   Candida krusei NOT DETECTED NOT DETECTED Final   Candida parapsilosis NOT DETECTED NOT DETECTED Final   Candida tropicalis NOT DETECTED NOT DETECTED Final    Comment: Performed at [facility], [place] [address] [place], [place] [address]  MRSA PCR Screening     Status: None   Collection Time: 11/11/17  8:54 PM  Result Value Ref Range Status   MRSA by PCR NEGATIVE NEGATIVE Final    Comment:        The GeneXpert MRSA Assay (FDA approved for NASAL specimens only), is one component of a comprehensive MRSA colonization surveillance program. It is not intended to diagnose MRSA infection nor to guide or monitor treatment for MRSA infections. Performed at [facility], [place] [address] [place], [place] [address]   Respiratory Panel by PCR     Status: None   Collection Time: 11/11/17  8:54 PM  Result Value Ref Range Status   Adenovirus NOT DETECTED NOT DETECTED Final   Coronavirus 229E NOT DETECTED NOT DETECTED Final   Coronavirus HKU1 NOT DETECTED NOT DETECTED Final   Coronavirus NL63 NOT DETECTED NOT  DETECTED Final   Coronavirus OC43 NOT DETECTED NOT DETECTED Final   Metapneumovirus NOT DETECTED NOT DETECTED Final   Rhinovirus / Enterovirus NOT DETECTED NOT DETECTED Final   Influenza A  NOT DETECTED NOT DETECTED Final   Influenza B NOT DETECTED NOT DETECTED Final   Parainfluenza Virus 1 NOT DETECTED NOT DETECTED Final   Parainfluenza Virus 2 NOT DETECTED NOT DETECTED Final   Parainfluenza Virus 3 NOT DETECTED NOT DETECTED Final   Parainfluenza Virus 4 NOT DETECTED NOT DETECTED Final   Respiratory Syncytial Virus NOT DETECTED NOT DETECTED Final   Bordetella pertussis NOT DETECTED NOT DETECTED Final   Chlamydophila pneumoniae NOT DETECTED NOT DETECTED Final   Mycoplasma pneumoniae NOT DETECTED NOT DETECTED Final    Comment: Performed at [facility], [place] [address], [place], [place] [address]  Urine Culture     Status: None   Collection Time: 11/12/17  9:54 AM  Result Value Ref Range Status   Specimen Description   Final    URINE, CLEAN CATCH Performed at [facility], [place] [address], [place], [place] [address]    Special Requests   Final    NONE Performed at [facility], [place] [address], [place], [place] [address]    Culture   Final    NO GROWTH Performed at [facility], [place] [address], [place], [place] [address]    Report Status 11/13/2017 FINAL  Final  Gastrointestinal Panel by PCR , Stool     Status: None   Collection Time: 11/12/17 12:40 PM  Result Value Ref Range Status   Campylobacter species NOT DETECTED NOT DETECTED Final   Plesimonas shigelloides NOT DETECTED NOT DETECTED Final   Salmonella species NOT DETECTED NOT DETECTED Final   Yersinia enterocolitica NOT DETECTED NOT DETECTED Final   Vibrio species NOT DETECTED NOT DETECTED Final   Vibrio cholerae NOT DETECTED NOT DETECTED Final   Enteroaggregative E coli (EAEC) NOT DETECTED NOT DETECTED Final   Enteropathogenic E coli (EPEC) NOT DETECTED NOT DETECTED Final    Enterotoxigenic E coli (ETEC) NOT DETECTED NOT DETECTED Final   Shiga like toxin producing E coli (STEC) NOT DETECTED NOT DETECTED Final   Shigella/Enteroinvasive E coli (EIEC) NOT DETECTED NOT DETECTED Final   Cryptosporidium NOT DETECTED NOT DETECTED Final   Cyclospora cayetanensis NOT DETECTED NOT DETECTED Final   Entamoeba histolytica NOT DETECTED NOT DETECTED Final   Giardia lamblia NOT DETECTED NOT DETECTED Final   Adenovirus F40/41 NOT DETECTED NOT DETECTED Final   Astrovirus NOT DETECTED NOT DETECTED Final   Norovirus GI/GII NOT DETECTED NOT DETECTED Final   Rotavirus A NOT DETECTED NOT DETECTED Final   Sapovirus (I, II, IV, and V) NOT DETECTED NOT DETECTED Final    Comment: Performed at [facility], [place] [place],  [address]     Patient was seen and examined on the day of discharge and was found to be in stable condition. Time coordinating discharge: 35 minutes including assessment and coordination of care, as well as examination of the patient.   SIGNED:  Dessa Phi, DO [facility] Pager [phone]  If 7PM-7AM, please contact night-coverage [email or web address] Password [facility] 11/14/2017, 10:54 AM

## 2017-11-14 NOTE — Progress Notes (Signed)
Reviewed discharge information with patient and caregiver. Answered all questions. Patient/caregiver able to teach back medications and reasons to contact MD/911. Patient verbalizes importance of PCP follow up appointment.  Dior Dominik M. Kirsten Spearing, RN  

## 2017-11-15 LAB — CULTURE, BLOOD (SINGLE): SPECIAL REQUESTS: ADEQUATE

## 2017-11-16 LAB — CULTURE, BLOOD (SINGLE): Special Requests: ADEQUATE

## 2017-11-16 NOTE — Progress Notes (Signed)
These preliminary result these preliminary results were noted.  Awaiting final report.

## 2017-11-17 ENCOUNTER — Encounter: Payer: Self-pay | Admitting: Nurse Practitioner

## 2017-11-17 ENCOUNTER — Inpatient Hospital Stay: Payer: Medicaid Other

## 2017-11-17 ENCOUNTER — Telehealth: Payer: Self-pay | Admitting: Nurse Practitioner

## 2017-11-17 ENCOUNTER — Inpatient Hospital Stay: Payer: Medicaid Other | Attending: Nurse Practitioner

## 2017-11-17 ENCOUNTER — Inpatient Hospital Stay (HOSPITAL_BASED_OUTPATIENT_CLINIC_OR_DEPARTMENT_OTHER): Payer: Medicaid Other | Admitting: Nurse Practitioner

## 2017-11-17 VITALS — BP 112/77 | HR 90 | Temp 98.1°F | Resp 20 | Ht 65.0 in | Wt 115.4 lb

## 2017-11-17 DIAGNOSIS — Z5111 Encounter for antineoplastic chemotherapy: Secondary | ICD-10-CM | POA: Diagnosis present

## 2017-11-17 DIAGNOSIS — R509 Fever, unspecified: Secondary | ICD-10-CM | POA: Insufficient documentation

## 2017-11-17 DIAGNOSIS — Z95828 Presence of other vascular implants and grafts: Secondary | ICD-10-CM

## 2017-11-17 DIAGNOSIS — Z79899 Other long term (current) drug therapy: Secondary | ICD-10-CM | POA: Insufficient documentation

## 2017-11-17 DIAGNOSIS — D61810 Antineoplastic chemotherapy induced pancytopenia: Secondary | ICD-10-CM

## 2017-11-17 DIAGNOSIS — D6481 Anemia due to antineoplastic chemotherapy: Secondary | ICD-10-CM | POA: Diagnosis not present

## 2017-11-17 DIAGNOSIS — K625 Hemorrhage of anus and rectum: Secondary | ICD-10-CM | POA: Diagnosis not present

## 2017-11-17 DIAGNOSIS — C787 Secondary malignant neoplasm of liver and intrahepatic bile duct: Secondary | ICD-10-CM

## 2017-11-17 DIAGNOSIS — C257 Malignant neoplasm of other parts of pancreas: Secondary | ICD-10-CM | POA: Insufficient documentation

## 2017-11-17 DIAGNOSIS — G893 Neoplasm related pain (acute) (chronic): Secondary | ICD-10-CM

## 2017-11-17 DIAGNOSIS — D509 Iron deficiency anemia, unspecified: Secondary | ICD-10-CM | POA: Diagnosis not present

## 2017-11-17 DIAGNOSIS — C259 Malignant neoplasm of pancreas, unspecified: Secondary | ICD-10-CM

## 2017-11-17 DIAGNOSIS — B965 Pseudomonas (aeruginosa) (mallei) (pseudomallei) as the cause of diseases classified elsewhere: Secondary | ICD-10-CM | POA: Insufficient documentation

## 2017-11-17 LAB — CMP (CANCER CENTER ONLY)
ALK PHOS: 465 U/L — AB (ref 40–150)
ALT: 32 U/L (ref 0–55)
ANION GAP: 8 (ref 3–11)
AST: 40 U/L — ABNORMAL HIGH (ref 5–34)
Albumin: 2.8 g/dL — ABNORMAL LOW (ref 3.5–5.0)
BILIRUBIN TOTAL: 0.6 mg/dL (ref 0.2–1.2)
BUN: 5 mg/dL — ABNORMAL LOW (ref 7–26)
CALCIUM: 9.6 mg/dL (ref 8.4–10.4)
CO2: 23 mmol/L (ref 22–29)
Chloride: 106 mmol/L (ref 98–109)
Creatinine: 0.81 mg/dL (ref 0.60–1.10)
GFR, Estimated: >60 mL/min (ref >60)
Glucose, Bld: 105 mg/dL (ref 70–140)
Potassium: 3.9 mmol/L (ref 3.5–5.1)
SODIUM: 137 mmol/L (ref 136–145)
TOTAL PROTEIN: 6.8 g/dL (ref 6.4–8.3)

## 2017-11-17 LAB — CBC WITH DIFFERENTIAL (CANCER CENTER ONLY)
Basophils Absolute: 0 10*3/uL (ref 0.0–0.1)
Basophils Relative: 0 %
EOS PCT: 2 %
Eosinophils Absolute: 0.1 10*3/uL (ref 0.0–0.5)
HCT: 25.5 % — ABNORMAL LOW (ref 34.8–46.6)
Hemoglobin: 8.2 g/dL — ABNORMAL LOW (ref 11.6–15.9)
Lymphocytes Relative: 24 %
Lymphs Abs: 0.7 10*3/uL — ABNORMAL LOW (ref 0.9–3.3)
MCH: 27.4 pg (ref 25.1–34.0)
MCHC: 32.3 g/dL (ref 31.5–36.0)
MCV: 85.1 fL (ref 79.5–101.0)
Monocytes Absolute: 0.7 10*3/uL (ref 0.1–0.9)
Monocytes Relative: 21 %
NEUTROS ABS: 1.6 10*3/uL (ref 1.5–6.5)
NEUTROS PCT: 53 %
PLATELETS: 155 10*3/uL (ref 145–400)
RBC: 2.99 MIL/uL — ABNORMAL LOW (ref 3.70–5.45)
RDW: 18.4 % — ABNORMAL HIGH (ref 11.2–14.5)
WBC Count: 3.1 10*3/uL — ABNORMAL LOW (ref 3.9–10.3)

## 2017-11-17 MED ORDER — HYDROMORPHONE HCL 4 MG PO TABS: 4.0000 mg | ORAL_TABLET | ORAL | 0 refills | Status: DC | PRN

## 2017-11-17 MED ORDER — LORAZEPAM 0.5 MG PO TABS: 0.5000 mg | ORAL_TABLET | Freq: Three times a day (TID) | ORAL | 0 refills | Status: DC | PRN

## 2017-11-17 MED ORDER — HEPARIN SOD (PORK) LOCK FLUSH 100 UNIT/ML IV SOLN
500.0000 [IU] | Freq: Once | INTRAVENOUS | Status: AC
  Administered 2017-11-17: 500 [IU]
  Filled 2017-11-17: qty 5

## 2017-11-17 MED ORDER — SODIUM CHLORIDE 0.9% FLUSH
10.0000 mL | Freq: Once | INTRAVENOUS | Status: AC
  Administered 2017-11-17: 10 mL
  Filled 2017-11-17: qty 10

## 2017-11-17 MED ORDER — LIDOCAINE-PRILOCAINE 2.5-2.5 % EX CREA: TOPICAL_CREAM | CUTANEOUS | 1 refills | Status: AC

## 2017-11-17 MED FILL — HYDROmorphone HCL 4 MG TABS: 4 | 2 days supply | Qty: 30 | Fill #0

## 2017-11-17 MED FILL — LIDOCAINE-PRILOCAINE CREAM: 2.5-2.5 | 10 days supply | Qty: 30 | Fill #0

## 2017-11-17 MED FILL — LORazepam 0.5 MG TABS: 0.5 | 10 days supply | Qty: 30 | Fill #0

## 2017-11-17 NOTE — Telephone Encounter (Signed)
Unable to schedule appt per 4/2 los - due to capped days - appts logged and patient will be contacted when appts are able to be scheduled.

## 2017-11-17 NOTE — Progress Notes (Addendum)
[facility] OFFICE PROGRESS NOTE   Diagnosis: Pancreas cancer  INTERVAL HISTORY:   Ms. Grupp returns as scheduled.  She completed cycle 1 gemcitabine/Abraxane 11/04/2017.  She was hospitalized 11/11/2017 through 11/14/2017 with fever.  Blood culture from the Port-A-Cath and left arm returned positive for STENOTROPHOMONAS MALTOPHILIA.  She was discharged home on a 5-day course of Septra DS.  She is feeling better.  She reports she has 2 days of Septra remaining.  No fever.  She denies pain.  She continues MS Contin and Dilaudid as needed.  Appetite is poor.  Recent rash has resolved.  She reports recent numbness in the fingertips and toes.  This does not interfere with activity.  Objective:  Vital signs in last 24 hours:  Blood pressure 112/77, pulse 90, temperature 98.1 F (36.7 C), temperature source Oral, resp. rate 20, height 5\' 5"  (1.651 m), weight 115 lb 6.4 oz (52.3 kg), SpO2 100 %.    HEENT: No thrush or ulcers. Resp: Lungs clear bilaterally. Cardio: Regular rate and rhythm. GI: Abdomen soft and nontender.  No hepatomegaly.  Stable nodularity at the umbilicus. Vascular: No leg edema.  Calves soft and nontender. Neuro: Alert and oriented. Skin: Faint rash at the waistline posterolaterally. Port-A-Cath without erythema.   Lab Results:  Lab Results  Component Value Date   WBC 3.1 (L) 11/17/2017   HGB 7.3 (L) 11/14/2017   HCT 25.5 (L) 11/17/2017   MCV 85.1 11/17/2017   PLT 155 11/17/2017   NEUTROABS 1.6 11/17/2017    Imaging:  No results found.  Medications: I have reviewed the patient's current medications.  Assessment/Plan: 1. 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 Adenocarcinoma consistent with pancreatobiliary primary.  Cycle 1 FOLFIRINOX 06/18/2017  Cycle 2FOLFIRINOX 07/02/2017  Cycle 3 FOLFIRINOX 07/16/2017 (oxaliplatin dose reduced due to mild thrombocytopenia)  CT 07/24/2017-new splenomegaly and left hydronephrosis  Cycle 4 FOLFIRINOX 08/08/2017  Cycle 5 FOLFIRINOX 08/24/2017  CT abdomen/pelvis 09/03/2017-stable liver lesions and pancreas mass, stable right biliary dilatation, possible new 8 mm right liver lesion versus a vascular phenomena, lesion at the right paracolic gutter was present in the past-significance unclear  Cycle 6 FOLFIRINOX1/29/2019  Cycle7 FOLFIRINOX2/19/2019 (Irinotecan andOxaliplatin dose reduced due to thrombocytopenia)  CT abdomen/pelvis 10/25/2017- findings concerning for enterocolitis. No bowel obstruction. Ill-defined pancreatic head mass. Multiple hepatic hypoenhancing lesions with the largest measuring 1.9 x 2.0 cm, previously 1.7 x 1.7 cm; 9 x 10 mm hypoenhancing lesion in the inferior right lobe of the liver slightly larger compared to the previous CT; 1.6 x 2.4 cm enhancing lesion in the right paracolic gutter increased in size compared to the prior CT.  Cycle 1 gemcitabine/Abraxane 11/04/2017  2. Pain secondary to #1.Improved.  3. Weight losssecondary to #1.  4. Microcytic anemia  Transfused with 1 unit of packed red blood cells 07/25/2017  5. Thrombocytopenia secondary to chemotherapy  6. Port-A-Cath placement 06/04/2017  7. Elevated liver enzymes/bilirubin10/22/2018-likely early biliary obstruction secondary to pancreas cancer, status post ERCP-placement of a common bile duct stent 06/09/2018  8.Postprandial abdominal pain. Protonix initiated 07/16/2017.Trial of Reglan initiated 08/24/2017.  9.Admission with fever  07/24/2017-no source for infection identified, resolved  10.Diarrheafollowing cycle 3 FOLFIRINOX-resolved  11.Left hydronephrosis noted on CT12/03/2017-etiology unclear, urology consulted-stent not recommended.Mild left hydronephrosis noted on CT 10/25/2017. Creatinine normal 10/27/2017.  12. Admission 08/31/2017 with a high fever, blood culture positive for gram-negative rods-Klebsiella pneumoniae identified  13. Anemia secondary to chemotherapy, chronic disease, and sepsis-status post transfusion with packed red blood cells 09/02/2017  14.Fever 10/25/2017 status post evaluation in the emergency department. CT with findings concerning for enterocolitis.   15.  Admission 11/11/2017 with a high fever- blood cultures (peripheral and Port-A-Cath) positive for gram-negative rods--STENOTROPHOMONAS MALTOPHILIA; course of Septra completed as an outpatient.  16.  Rash-potentially secondary to gemcitabine or infection, improved  17.  Pancytopenia secondary to chemotherapy; improved.    Disposition: Ms. Vandehei appears stable.  She has completed 1 cycle of gemcitabine/Abraxane.  She was subsequently hospitalized with fever.  Blood cultures returned positive for stenotrophomonas maltophilia.  She is completing a course of Septra.  We decided to hold chemotherapy today and reschedule for 1 week.  The numbness/tingling in the fingertips and toes is most likely related to previous Oxaliplatin.  The rash she had during the recent hospitalization has improved.  She will return for cycle 2 gemcitabine/Abraxane in 1 week.  We will see her prior to cycle 3 on 12/08/2017.  She will contact the office in the interim with any problems.  Patient seen with Dr. Benay Spice.    [name] ANP/GNP-BC   11/17/2017  10:42 AM This was a shared visit with [name].  Ms. Woodroof was interviewed and examined.  She is recovering from the recent hospital admission with gram-negative bacteremia.  She is  completing a course of Bactrim.  The next cycle of chemotherapy will be delayed for 1 week.  [name], MD

## 2017-11-18 ENCOUNTER — Telehealth: Payer: Self-pay | Admitting: Nurse Practitioner

## 2017-11-18 NOTE — Telephone Encounter (Signed)
Scheduled appt per 4/2 los - left message with appt date and time and sent reminder letter in the mail with appt date and time.

## 2017-11-24 ENCOUNTER — Other Ambulatory Visit: Payer: Self-pay | Admitting: Nurse Practitioner

## 2017-11-24 DIAGNOSIS — C787 Secondary malignant neoplasm of liver and intrahepatic bile duct: Principal | ICD-10-CM

## 2017-11-24 DIAGNOSIS — C259 Malignant neoplasm of pancreas, unspecified: Secondary | ICD-10-CM

## 2017-11-25 ENCOUNTER — Other Ambulatory Visit: Payer: Self-pay

## 2017-11-25 ENCOUNTER — Other Ambulatory Visit: Payer: Self-pay | Admitting: Nurse Practitioner

## 2017-11-25 ENCOUNTER — Inpatient Hospital Stay: Payer: Medicaid Other

## 2017-11-25 ENCOUNTER — Inpatient Hospital Stay (HOSPITAL_BASED_OUTPATIENT_CLINIC_OR_DEPARTMENT_OTHER): Payer: Medicaid Other | Admitting: Nurse Practitioner

## 2017-11-25 ENCOUNTER — Telehealth: Payer: Self-pay | Admitting: Nurse Practitioner

## 2017-11-25 VITALS — BP 120/87 | HR 85 | Temp 98.7°F | Resp 17 | Ht 65.0 in | Wt 111.8 lb

## 2017-11-25 DIAGNOSIS — C787 Secondary malignant neoplasm of liver and intrahepatic bile duct: Secondary | ICD-10-CM

## 2017-11-25 DIAGNOSIS — G893 Neoplasm related pain (acute) (chronic): Secondary | ICD-10-CM

## 2017-11-25 DIAGNOSIS — C259 Malignant neoplasm of pancreas, unspecified: Secondary | ICD-10-CM

## 2017-11-25 DIAGNOSIS — D61810 Antineoplastic chemotherapy induced pancytopenia: Secondary | ICD-10-CM

## 2017-11-25 DIAGNOSIS — D649 Anemia, unspecified: Secondary | ICD-10-CM

## 2017-11-25 DIAGNOSIS — R509 Fever, unspecified: Secondary | ICD-10-CM

## 2017-11-25 DIAGNOSIS — Z95828 Presence of other vascular implants and grafts: Secondary | ICD-10-CM

## 2017-11-25 DIAGNOSIS — Z5111 Encounter for antineoplastic chemotherapy: Secondary | ICD-10-CM | POA: Diagnosis not present

## 2017-11-25 LAB — CMP (CANCER CENTER ONLY)
ALBUMIN: 2.5 g/dL — AB (ref 3.5–5.0)
ALK PHOS: 573 U/L — AB (ref 40–150)
ALT: 44 U/L (ref 0–55)
ANION GAP: 10 (ref 3–11)
AST: 58 U/L — ABNORMAL HIGH (ref 5–34)
BILIRUBIN TOTAL: 1 mg/dL (ref 0.2–1.2)
BUN: 7 mg/dL (ref 7–26)
CALCIUM: 9.6 mg/dL (ref 8.4–10.4)
CO2: 24 mmol/L (ref 22–29)
Chloride: 102 mmol/L (ref 98–109)
Creatinine: 0.7 mg/dL (ref 0.60–1.10)
GFR, Est AFR Am: >60 mL/min (ref >60)
GFR, Estimated: >60 mL/min (ref >60)
GLUCOSE: 120 mg/dL (ref 70–140)
Potassium: 3.9 mmol/L (ref 3.5–5.1)
Sodium: 136 mmol/L (ref 136–145)
TOTAL PROTEIN: 7.6 g/dL (ref 6.4–8.3)

## 2017-11-25 LAB — CBC WITH DIFFERENTIAL (CANCER CENTER ONLY)
BASOS PCT: 0 %
Basophils Absolute: 0 10*3/uL (ref 0.0–0.1)
Eosinophils Absolute: 0 10*3/uL (ref 0.0–0.5)
Eosinophils Relative: 0 %
HEMATOCRIT: 26 % — AB (ref 34.8–46.6)
Hemoglobin: 8 g/dL — ABNORMAL LOW (ref 11.6–15.9)
LYMPHS PCT: 11 %
Lymphs Abs: 0.7 10*3/uL — ABNORMAL LOW (ref 0.9–3.3)
MCH: 26.4 pg (ref 25.1–34.0)
MCHC: 30.8 g/dL — AB (ref 31.5–36.0)
MCV: 85.8 fL (ref 79.5–101.0)
MONOS PCT: 8 %
Monocytes Absolute: 0.5 10*3/uL (ref 0.1–0.9)
NEUTROS ABS: 5.6 10*3/uL (ref 1.5–6.5)
NEUTROS PCT: 81 %
Platelet Count: 224 10*3/uL (ref 145–400)
RBC: 3.03 MIL/uL — ABNORMAL LOW (ref 3.70–5.45)
RDW: 16.9 % — ABNORMAL HIGH (ref 11.2–14.5)
WBC Count: 6.9 10*3/uL (ref 3.9–10.3)

## 2017-11-25 LAB — URINALYSIS, COMPLETE (UACMP) WITH MICROSCOPIC
BILIRUBIN URINE: NEGATIVE
Glucose, UA: NEGATIVE mg/dL
Hgb urine dipstick: NEGATIVE
KETONES UR: NEGATIVE mg/dL
Leukocytes, UA: NEGATIVE
Nitrite: NEGATIVE
PH: 7 (ref 5.0–8.0)
PROTEIN: NEGATIVE mg/dL
Specific Gravity, Urine: 1.015 (ref 1.005–1.030)

## 2017-11-25 LAB — ABO/RH: ABO/RH(D): O POS

## 2017-11-25 LAB — PREPARE RBC (CROSSMATCH)

## 2017-11-25 MED ORDER — PACLITAXEL PROTEIN-BOUND CHEMO INJECTION 100 MG
100.0000 mg/m2 | Freq: Once | Status: AC
  Administered 2017-11-25: 150 mg via INTRAVENOUS
  Filled 2017-11-25: qty 30

## 2017-11-25 MED ORDER — SODIUM CHLORIDE 0.9% FLUSH
10.0000 mL | INTRAVENOUS | Status: DC | PRN
  Administered 2017-11-25: 10 mL
  Filled 2017-11-25: qty 10

## 2017-11-25 MED ORDER — SULFAMETHOXAZOLE-TRIMETHOPRIM 800-160 MG PO TABS: 1.0000 | ORAL_TABLET | Freq: Every day | ORAL | 0 refills | Status: DC

## 2017-11-25 MED ORDER — SODIUM CHLORIDE 0.9% FLUSH
10.0000 mL | Freq: Once | INTRAVENOUS | Status: AC
  Administered 2017-11-25: 10 mL
  Filled 2017-11-25: qty 10

## 2017-11-25 MED ORDER — SODIUM CHLORIDE 0.9 % IV SOLN
Freq: Once | INTRAVENOUS | Status: AC
  Administered 2017-11-25: 14:00:00 via INTRAVENOUS

## 2017-11-25 MED ORDER — PROCHLORPERAZINE MALEATE 10 MG PO TABS
10.0000 mg | ORAL_TABLET | Freq: Once | ORAL | Status: AC
  Administered 2017-11-25: 10 mg via ORAL

## 2017-11-25 MED ORDER — PROCHLORPERAZINE MALEATE 10 MG PO TABS
ORAL_TABLET | ORAL | Status: AC
Start: 2017-11-25 — End: 2017-11-25
  Filled 2017-11-25: qty 1

## 2017-11-25 MED ORDER — HEPARIN SOD (PORK) LOCK FLUSH 100 UNIT/ML IV SOLN
500.0000 [IU] | Freq: Once | INTRAVENOUS | Status: AC | PRN
  Administered 2017-11-25: 500 [IU]
  Filled 2017-11-25: qty 5

## 2017-11-25 MED ORDER — SODIUM CHLORIDE 0.9 % IV SOLN
800.0000 mg/m2 | Freq: Once | INTRAVENOUS | Status: AC
  Administered 2017-11-25: 1254 mg via INTRAVENOUS
  Filled 2017-11-25: qty 32.98

## 2017-11-25 MED FILL — SULFAMETHOXAZOLE-TMP DS TAB: 800-160 | 30 days supply | Qty: 30 | Fill #0

## 2017-11-25 NOTE — Progress Notes (Addendum)
[place] OFFICE PROGRESS NOTE   Diagnosis: Pancreas cancer  INTERVAL HISTORY:   Crystal Carlson is seen in an unscheduled visit for evaluation of intermittent fever.  She is scheduled to resume chemotherapy today with cycle 2 gemcitabine/Abraxane.  She completed the outpatient course of Septra on 11/20/2017.  Around that same time she began to have low-grade fevers in the 99 degree range typically.  A few days ago she had a temperature of 101.3 and felt "cold".  No shaking chills.  She notes that she sweats and temperature improves after taking Tylenol.  She denies nausea/vomiting.  No diarrhea.  No rash.  She describes her appetite is "okay".  She reports pain is well controlled with MS Contin.  She rarely takes Dilaudid.  Objective:  Vital signs in last 24 hours:  Temperature 98.7, heart rate 85, respirations 17, blood pressure 120/87    HEENT: White coating over tongue.  No buccal thrush. Resp: Lungs clear bilaterally. Cardio: Regular rate and rhythm. GI: Abdomen soft.  Mild tenderness right abdomen.  No hepatomegaly.  No mass.  Stable nodularity at the umbilicus. Vascular: No leg edema. Neuro: Alert and oriented. Skin: No rash. Port-A-Cath without erythema.   Lab Results:  Lab Results  Component Value Date   WBC 6.9 11/25/2017   HGB 7.3 (L) 11/14/2017   HCT 26.0 (L) 11/25/2017   MCV 85.8 11/25/2017   PLT 224 11/25/2017   NEUTROABS 5.6 11/25/2017    Imaging:  No results found.  Medications: I have reviewed the patient's current medications.  Assessment/Plan: 1. 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 Adenocarcinoma consistent with pancreatobiliary primary.  Cycle 1 FOLFIRINOX 06/18/2017  Cycle 2FOLFIRINOX 07/02/2017  Cycle 3 FOLFIRINOX 07/16/2017 (oxaliplatin dose reduced due to mild thrombocytopenia)  CT 07/24/2017-new splenomegaly and left hydronephrosis  Cycle 4 FOLFIRINOX 08/08/2017  Cycle 5 FOLFIRINOX 08/24/2017  CT abdomen/pelvis 09/03/2017-stable liver lesions and pancreas mass, stable right biliary dilatation, possible new 8 mm right liver lesion versus a vascular phenomena, lesion at the right paracolic gutter was present in the past-significance unclear  Cycle 6 FOLFIRINOX1/29/2019  Cycle7 FOLFIRINOX2/19/2019 (Irinotecan andOxaliplatin dose reduced due to thrombocytopenia)  CT abdomen/pelvis 10/25/2017- findings concerning for enterocolitis. No bowel obstruction. Ill-defined pancreatic head mass. Multiple hepatic hypoenhancing lesions with the largest measuring 1.9 x 2.0 cm, previously 1.7 x 1.7 cm; 9 x 10 mm hypoenhancing lesion in the inferior right lobe of the liver slightly larger compared to the previous CT; 1.6 x 2.4 cm enhancing lesion in the right paracolic gutter increased in size compared to the prior CT.  Cycle 1 gemcitabine/Abraxane 11/04/2017  Cycle 2 gemcitabine/Abraxane 11/25/2017  2. Pain secondary to #1.Improved.  3. Weight losssecondary to #1.  4. Microcytic anemia  Transfused with 1 unit of packed red blood cells 07/25/2017  5. Thrombocytopenia secondary to chemotherapy  6. Port-A-Cath placement 06/04/2017  7. Elevated liver enzymes/bilirubin10/22/2018-likely early biliary obstruction secondary to pancreas cancer, status post ERCP-placement of a common bile duct stent 06/09/2018  8.Postprandial abdominal pain. Protonix initiated 07/16/2017.Trial of Reglan initiated 08/24/2017.  9.Admission with fever 07/24/2017-no source for  infection identified, resolved   10.Diarrheafollowing cycle 3 FOLFIRINOX-resolved  11.Left  hydronephrosis noted on CT12/03/2017-etiology unclear, urology consulted-stent not recommended.Mild left hydronephrosis noted on CT 10/25/2017. Creatinine normal 10/27/2017.  12. Admission 08/31/2017 with a high fever, blood culture positive for gram-negative rods-Klebsiella pneumoniae identified  13. Anemia secondary to chemotherapy, chronic disease, and sepsis-status post transfusion with packed red blood cells 09/02/2017  14.Fever 10/25/2017 status post evaluation in the emergency department. CT with findings concerning for enterocolitis.   15. Admission 11/11/2017 with a high fever-blood cultures (peripheral and Port-A-Cath) positive for gram-negative rods--STENOTROPHOMONAS MALTOPHILIA; course of Septra completed as an outpatient.  16. Rash-potentially secondary to gemcitabine or infection, improved  17. Pancytopenia secondary to chemotherapy; improved.  18.  Intermittent fever.  Question "tumor fever", question infection.  Septra resumed on a daily basis.     Disposition: Crystal Carlson appears unchanged.  She has completed 1 cycle of gemcitabine/Abraxane.  She presents today for cycle 2.  She is having intermittent low-grade fever.  We discussed possible etiologies including infection, "tumor fever".  She will resume Septra DS 1 tablet daily.  We will obtain a blood culture from the Port-A-Cath, urinalysis and urine culture.  Plan to proceed with cycle 2 gemcitabine/Abraxane today as scheduled.  She understands to contact the office with high fever, shaking chills.  She has progressive symptomatic anemia.  She would like to proceed with a blood transfusion.  We will try and get this scheduled later in the week.  She will return for lab, follow-up and cycle 3 gemcitabine/Abraxane on 12/08/2017.  She will contact the office in the interim as outlined above or with any other  problems.  Patient seen with Dr. Benay Spice.  25 minutes were spent face-to-face at today's visit with the majority that time involved in counseling/coordination of care.    [name] ANP/GNP-BC   11/25/2017  1:37 PM  This was a shared visit with [name].  Crystal Carlson was interviewed and examined.  The fever may be related to "tumor fever "or an infection.  We placed her on Bactrim.  She will call for a persistent high fever or chills.  The plan is to proceed with gemcitabine/Abraxane today.  I dose reduce the Abraxane secondary to neuropathy symptoms.  [name], MD

## 2017-11-25 NOTE — Telephone Encounter (Signed)
Scheduled appt per 4/10 los - patient to get an updated schedule in the treatment area.  

## 2017-11-25 NOTE — Patient Instructions (Signed)
[facility] Discharge Instructions for Patients Receiving Chemotherapy  Today you received the following chemotherapy agents Abraxane, Gemzar  To help prevent nausea and vomiting after your treatment, we encourage you to take your nausea medication as prescribed.   If you develop nausea and vomiting that is not controlled by your nausea medication, call the clinic.   BELOW ARE SYMPTOMS THAT SHOULD BE REPORTED IMMEDIATELY:  *FEVER GREATER THAN 100.5 F  *CHILLS WITH OR WITHOUT FEVER  NAUSEA AND VOMITING THAT IS NOT CONTROLLED WITH YOUR NAUSEA MEDICATION  *UNUSUAL SHORTNESS OF BREATH  *UNUSUAL BRUISING OR BLEEDING  TENDERNESS IN MOUTH AND THROAT WITH OR WITHOUT PRESENCE OF ULCERS  *URINARY PROBLEMS  *BOWEL PROBLEMS  UNUSUAL RASH Items with * indicate a potential emergency and should be followed up as soon as possible.  Feel free to call the clinic should you have any questions or concerns. The clinic phone number is [phone].  Please show the CHEMO ALERT CARD at check-in to the Emergency Department and triage nurse.   

## 2017-11-26 ENCOUNTER — Other Ambulatory Visit: Payer: Self-pay | Admitting: *Deleted

## 2017-11-26 LAB — URINE CULTURE: Culture: <10000 — AB

## 2017-11-27 ENCOUNTER — Inpatient Hospital Stay: Payer: Medicaid Other

## 2017-11-27 VITALS — BP 135/82 | HR 84 | Temp 98.7°F | Resp 16

## 2017-11-27 DIAGNOSIS — C787 Secondary malignant neoplasm of liver and intrahepatic bile duct: Principal | ICD-10-CM

## 2017-11-27 DIAGNOSIS — C259 Malignant neoplasm of pancreas, unspecified: Secondary | ICD-10-CM

## 2017-11-27 DIAGNOSIS — Z5111 Encounter for antineoplastic chemotherapy: Secondary | ICD-10-CM | POA: Diagnosis not present

## 2017-11-27 DIAGNOSIS — Z95828 Presence of other vascular implants and grafts: Secondary | ICD-10-CM

## 2017-11-27 MED ORDER — HEPARIN SOD (PORK) LOCK FLUSH 100 UNIT/ML IV SOLN
250.0000 [IU] | INTRAVENOUS | Status: DC | PRN
  Filled 2017-11-27: qty 5

## 2017-11-27 MED ORDER — HEPARIN SOD (PORK) LOCK FLUSH 100 UNIT/ML IV SOLN
500.0000 [IU] | Freq: Once | INTRAVENOUS | Status: DC
  Filled 2017-11-27: qty 5

## 2017-11-27 MED ORDER — SODIUM CHLORIDE 0.9% FLUSH
10.0000 mL | Freq: Once | INTRAVENOUS | Status: AC
  Administered 2017-11-27: 10 mL
  Filled 2017-11-27: qty 10

## 2017-11-27 MED ORDER — HEPARIN SOD (PORK) LOCK FLUSH 100 UNIT/ML IV SOLN
500.0000 [IU] | Freq: Every day | INTRAVENOUS | Status: AC | PRN
  Administered 2017-11-27: 500 [IU]
  Filled 2017-11-27: qty 5

## 2017-11-27 MED ORDER — SODIUM CHLORIDE 0.9 % IV SOLN
250.0000 mL | Freq: Once | INTRAVENOUS | Status: AC
  Administered 2017-11-27: 250 mL via INTRAVENOUS

## 2017-11-27 NOTE — Patient Instructions (Signed)

## 2017-11-30 LAB — BPAM RBC
BLOOD PRODUCT EXPIRATION DATE: 201905082359
Blood Product Expiration Date: 201905082359
ISSUE DATE / TIME: 201904121315
ISSUE DATE / TIME: 201904121315
Unit Type and Rh: 5100
Unit Type and Rh: 5100

## 2017-11-30 LAB — TYPE AND SCREEN
ABO/RH(D): O POS
ANTIBODY SCREEN: NEGATIVE
UNIT DIVISION: 0
Unit division: 0

## 2017-11-30 LAB — CULTURE, BLOOD (SINGLE)
Culture: NO GROWTH
SPECIAL REQUESTS: ADEQUATE

## 2017-12-01 ENCOUNTER — Ambulatory Visit: Payer: Medicaid Other | Admitting: Oncology

## 2017-12-01 ENCOUNTER — Other Ambulatory Visit: Payer: Self-pay | Admitting: Nurse Practitioner

## 2017-12-01 ENCOUNTER — Other Ambulatory Visit: Payer: Medicaid Other

## 2017-12-01 ENCOUNTER — Telehealth: Payer: Self-pay | Admitting: *Deleted

## 2017-12-01 ENCOUNTER — Ambulatory Visit: Payer: Medicaid Other

## 2017-12-01 DIAGNOSIS — G893 Neoplasm related pain (acute) (chronic): Secondary | ICD-10-CM

## 2017-12-01 DIAGNOSIS — E876 Hypokalemia: Secondary | ICD-10-CM

## 2017-12-01 DIAGNOSIS — C787 Secondary malignant neoplasm of liver and intrahepatic bile duct: Principal | ICD-10-CM

## 2017-12-01 DIAGNOSIS — C259 Malignant neoplasm of pancreas, unspecified: Secondary | ICD-10-CM

## 2017-12-01 NOTE — Telephone Encounter (Signed)
Call received from [facility] in reference to patient fever.    "She received blood transfusion treatment Friday.  continues with fever 99  to 100 .    Takes tylenol which brings it down to 98 within a couple of hours but she goes back up to 100  within four hours.    No chills or shaking but she has said she's cold.    No trouble urinating, No itching, no trouble breathing.  Want advice on anything else to do or does she need to come in."    Call transferred to [phone] for review and help with this matter.

## 2017-12-01 NOTE — Telephone Encounter (Signed)
Spoke with MD regarding triage message from [place], [place]. Per Dr. Benay Spice, pt to take Aleve OTC BID. Pt to call with any worsening symptoms, shaking chills, etc. Pt husband voiced understanding.

## 2017-12-06 ENCOUNTER — Other Ambulatory Visit: Payer: Self-pay | Admitting: Oncology

## 2017-12-07 ENCOUNTER — Other Ambulatory Visit: Payer: Self-pay

## 2017-12-07 DIAGNOSIS — C787 Secondary malignant neoplasm of liver and intrahepatic bile duct: Secondary | ICD-10-CM

## 2017-12-08 ENCOUNTER — Telehealth: Payer: Self-pay | Admitting: Oncology

## 2017-12-08 ENCOUNTER — Inpatient Hospital Stay: Payer: Medicaid Other | Admitting: Nutrition

## 2017-12-08 ENCOUNTER — Inpatient Hospital Stay (HOSPITAL_BASED_OUTPATIENT_CLINIC_OR_DEPARTMENT_OTHER): Payer: Medicaid Other | Admitting: Oncology

## 2017-12-08 ENCOUNTER — Inpatient Hospital Stay: Payer: Medicaid Other

## 2017-12-08 ENCOUNTER — Telehealth: Payer: Self-pay

## 2017-12-08 VITALS — BP 106/75 | HR 78 | Temp 98.4°F | Resp 17 | Ht 65.0 in | Wt 114.4 lb

## 2017-12-08 DIAGNOSIS — C787 Secondary malignant neoplasm of liver and intrahepatic bile duct: Secondary | ICD-10-CM

## 2017-12-08 DIAGNOSIS — C259 Malignant neoplasm of pancreas, unspecified: Secondary | ICD-10-CM

## 2017-12-08 DIAGNOSIS — Z5111 Encounter for antineoplastic chemotherapy: Secondary | ICD-10-CM | POA: Diagnosis not present

## 2017-12-08 DIAGNOSIS — Z95828 Presence of other vascular implants and grafts: Secondary | ICD-10-CM

## 2017-12-08 DIAGNOSIS — R634 Abnormal weight loss: Secondary | ICD-10-CM

## 2017-12-08 DIAGNOSIS — R509 Fever, unspecified: Secondary | ICD-10-CM

## 2017-12-08 DIAGNOSIS — G629 Polyneuropathy, unspecified: Secondary | ICD-10-CM

## 2017-12-08 DIAGNOSIS — G893 Neoplasm related pain (acute) (chronic): Secondary | ICD-10-CM

## 2017-12-08 DIAGNOSIS — K625 Hemorrhage of anus and rectum: Secondary | ICD-10-CM

## 2017-12-08 LAB — CBC WITH DIFFERENTIAL (CANCER CENTER ONLY)
BASOS PCT: 0 %
Basophils Absolute: 0 10*3/uL (ref 0.0–0.1)
Eosinophils Absolute: 0.2 10*3/uL (ref 0.0–0.5)
Eosinophils Relative: 5 %
HEMATOCRIT: 28.6 % — AB (ref 34.8–46.6)
HEMOGLOBIN: 8.9 g/dL — AB (ref 11.6–15.9)
LYMPHS PCT: 20 %
Lymphs Abs: 1 10*3/uL (ref 0.9–3.3)
MCH: 26.5 pg (ref 25.1–34.0)
MCHC: 31.1 g/dL — AB (ref 31.5–36.0)
MCV: 85.1 fL (ref 79.5–101.0)
MONO ABS: 1 10*3/uL — AB (ref 0.1–0.9)
MONOS PCT: 20 %
NEUTROS ABS: 2.8 10*3/uL (ref 1.5–6.5)
Neutrophils Relative %: 55 %
Platelet Count: 235 10*3/uL (ref 145–400)
RBC: 3.36 MIL/uL — ABNORMAL LOW (ref 3.70–5.45)
RDW: 17.2 % — AB (ref 11.2–14.5)
WBC Count: 5.1 10*3/uL (ref 3.9–10.3)

## 2017-12-08 LAB — CMP (CANCER CENTER ONLY)
ALBUMIN: 2.4 g/dL — AB (ref 3.5–5.0)
ALT: 60 U/L — ABNORMAL HIGH (ref 0–55)
AST: 53 U/L — AB (ref 5–34)
Alkaline Phosphatase: 732 U/L — ABNORMAL HIGH (ref 40–150)
Anion gap: 12 — ABNORMAL HIGH (ref 3–11)
BUN: 11 mg/dL (ref 7–26)
CHLORIDE: 104 mmol/L (ref 98–109)
CO2: 21 mmol/L — AB (ref 22–29)
Calcium: 9.7 mg/dL (ref 8.4–10.4)
Creatinine: 0.83 mg/dL (ref 0.60–1.10)
GFR, Est AFR Am: >60 mL/min (ref >60)
GFR, Estimated: >60 mL/min (ref >60)
GLUCOSE: 109 mg/dL (ref 70–140)
POTASSIUM: 4.2 mmol/L (ref 3.5–5.1)
SODIUM: 137 mmol/L (ref 136–145)
TOTAL PROTEIN: 7.7 g/dL (ref 6.4–8.3)
Total Bilirubin: 0.8 mg/dL (ref 0.2–1.2)

## 2017-12-08 MED ORDER — PROCHLORPERAZINE MALEATE 10 MG PO TABS
ORAL_TABLET | ORAL | Status: AC
Start: 2017-12-08 — End: 2017-12-08
  Filled 2017-12-08: qty 1

## 2017-12-08 MED ORDER — SODIUM CHLORIDE 0.9% FLUSH
10.0000 mL | Freq: Once | INTRAVENOUS | Status: AC
  Administered 2017-12-08: 10 mL
  Filled 2017-12-08: qty 10

## 2017-12-08 MED ORDER — HEPARIN SOD (PORK) LOCK FLUSH 100 UNIT/ML IV SOLN
500.0000 [IU] | Freq: Once | INTRAVENOUS | Status: AC | PRN
  Administered 2017-12-08: 500 [IU]
  Filled 2017-12-08: qty 5

## 2017-12-08 MED ORDER — HYDROMORPHONE HCL 4 MG PO TABS: 4.0000 mg | ORAL_TABLET | ORAL | 0 refills | Status: DC | PRN

## 2017-12-08 MED ORDER — SODIUM CHLORIDE 0.9 % IV SOLN
Freq: Once | INTRAVENOUS | Status: AC
  Administered 2017-12-08: 10:00:00 via INTRAVENOUS

## 2017-12-08 MED ORDER — SODIUM CHLORIDE 0.9 % IV SOLN
800.0000 mg/m2 | Freq: Once | INTRAVENOUS | Status: AC
  Administered 2017-12-08: 1254 mg via INTRAVENOUS
  Filled 2017-12-08: qty 32.98

## 2017-12-08 MED ORDER — PROCHLORPERAZINE MALEATE 10 MG PO TABS
10.0000 mg | ORAL_TABLET | Freq: Once | ORAL | Status: AC
  Administered 2017-12-08: 10 mg via ORAL

## 2017-12-08 MED ORDER — MORPHINE SULFATE ER 15 MG PO TBCR: 15.0000 mg | EXTENDED_RELEASE_TABLET | Freq: Two times a day (BID) | ORAL | 0 refills | Status: DC

## 2017-12-08 MED ORDER — PACLITAXEL PROTEIN-BOUND CHEMO INJECTION 100 MG
100.0000 mg/m2 | Freq: Once | Status: AC
  Administered 2017-12-08: 150 mg via INTRAVENOUS
  Filled 2017-12-08: qty 30

## 2017-12-08 MED ORDER — LORAZEPAM 0.5 MG PO TABS: 0.5000 mg | ORAL_TABLET | Freq: Three times a day (TID) | ORAL | 0 refills | Status: DC | PRN

## 2017-12-08 MED ORDER — SODIUM CHLORIDE 0.9% FLUSH
10.0000 mL | INTRAVENOUS | Status: DC | PRN
  Administered 2017-12-08: 10 mL
  Filled 2017-12-08: qty 10

## 2017-12-08 MED FILL — MORPHINE SULF ER 15 MG TAB: 15 | 30 days supply | Qty: 60 | Fill #0

## 2017-12-08 NOTE — Telephone Encounter (Signed)
Scheduled appt per 4/23 los - patient to get an updated schedule in treatment area.

## 2017-12-08 NOTE — Progress Notes (Signed)
Nutrition follow-up completed with patient during infusion for metastatic pancreas cancer. Weight has decreased and was documented as 114.4 pounds April 23.   This is decreased from 120 pounds March 10. Patient reports she has constipation.  She states she is going to try MiraLAX. Appetite still remains fair. States she has been drinking boost plus daily.  Nutrition diagnosis: Inadequate oral intake continues.  Intervention: Educated patient to increase boost plus twice daily. Reviewed importance of bowel regimen so patient does not experience constipation. Reviewed high-calorie high-protein foods. Teach back method used.  Monitoring, evaluation, goals: Patient will increase calories and protein to minimize further weight loss.  Next visit: To be scheduled as needed.  **Disclaimer: This note was dictated with voice recognition software. Similar sounding words can inadvertently be transcribed and this note may contain transcription errors which may not have been corrected upon publication of note.**

## 2017-12-08 NOTE — Patient Instructions (Signed)
[facility] Discharge Instructions for Patients Receiving Chemotherapy  Today you received the following chemotherapy agents Abraxane, Gemzar  To help prevent nausea and vomiting after your treatment, we encourage you to take your nausea medication as prescribed.   If you develop nausea and vomiting that is not controlled by your nausea medication, call the clinic.   BELOW ARE SYMPTOMS THAT SHOULD BE REPORTED IMMEDIATELY:  *FEVER GREATER THAN 100.5 F  *CHILLS WITH OR WITHOUT FEVER  NAUSEA AND VOMITING THAT IS NOT CONTROLLED WITH YOUR NAUSEA MEDICATION  *UNUSUAL SHORTNESS OF BREATH  *UNUSUAL BRUISING OR BLEEDING  TENDERNESS IN MOUTH AND THROAT WITH OR WITHOUT PRESENCE OF ULCERS  *URINARY PROBLEMS  *BOWEL PROBLEMS  UNUSUAL RASH Items with * indicate a potential emergency and should be followed up as soon as possible.  Feel free to call the clinic should you have any questions or concerns. The clinic phone number is [phone].  Please show the CHEMO ALERT CARD at check-in to the Emergency Department and triage nurse.   

## 2017-12-08 NOTE — Patient Instructions (Signed)

## 2017-12-08 NOTE — Progress Notes (Signed)
**Crystal Crystal** Crystal Crystal   Diagnosis: Pancreas cancer  INTERVAL HISTORY:   Crystal Crystal turns as scheduled.  She completed another cycle of gemcitabine/Abraxane 11/25/2017.  She reports no fever for the past 4 days.  Abdominal pain has improved.  She continues to have neuropathy symptoms in the hands and feet.  She has constipation.  She has mild bleeding from the rectum.  She feels there is ulceration at the anus.  No recurrence of the rash.  Objective:  Vital signs in last 24 hours:  Blood pressure 106/75, pulse 78, temperature 98.4 F (36.9 C), temperature source Oral, resp. rate 17, height 5\' 5"  (1.651 m), weight 114 lb 6.4 oz (51.9 kg), SpO2 100 %.    HEENT: No thrush or ulcers Resp: Lungs clear bilaterally Cardio: The rate and rhythm GI: No hepatomegaly, nontender, 1 cm umbilical nodule Vascular: No leg edema  Skin: No rash  Portacath/PICC-without erythema  Lab Results:  Lab Results  Component Value Date   WBC 5.1 12/08/2017   HGB 8.9 (L) 12/08/2017   HCT 28.6 (L) 12/08/2017   MCV 85.1 12/08/2017   PLT 235 12/08/2017   NEUTROABS 2.8 12/08/2017    CMP     Component Value Date/Time   NA 136 11/25/2017 1038   NA 141 08/07/2017 1205   K 3.9 11/25/2017 1038   K 4.2 08/07/2017 1205   CL 102 11/25/2017 1038   CO2 24 11/25/2017 1038   CO2 25 08/07/2017 1205   GLUCOSE 120 11/25/2017 1038   GLUCOSE 114 08/07/2017 1205   BUN 7 11/25/2017 1038   BUN 9.6 08/07/2017 1205   CREATININE 0.70 11/25/2017 1038   CREATININE 0.8 08/07/2017 1205   CALCIUM 9.6 11/25/2017 1038   CALCIUM 9.1 08/07/2017 1205   PROT 7.6 11/25/2017 1038   PROT 6.7 08/07/2017 1205   ALBUMIN 2.5 (L) 11/25/2017 1038   ALBUMIN 3.0 (L) 08/07/2017 1205   AST 58 (H) 11/25/2017 1038   AST 58 (H) 08/07/2017 1205   ALT 44 11/25/2017 1038   ALT 59 (H) 08/07/2017 1205   ALKPHOS 573 (H) 11/25/2017 1038   ALKPHOS 520 (H) 08/07/2017 1205   BILITOT 1.0 11/25/2017 1038   BILITOT 0.42  08/07/2017 1205   GFRNONAA >60 11/25/2017 1038   GFRAA >60 11/25/2017 1038     Imaging:  No results found.  Medications: I have reviewed the patient's current medications.   Assessment/Plan:  1. 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 Adenocarcinoma consistent with pancreatobiliary primary.  Cycle 1 FOLFIRINOX 06/18/2017  Cycle 2FOLFIRINOX 07/02/2017  Cycle 3 FOLFIRINOX 07/16/2017 (oxaliplatin dose reduced due to mild thrombocytopenia)  CT 07/24/2017-new splenomegaly and left hydronephrosis  Cycle 4 FOLFIRINOX 08/08/2017  Cycle 5 FOLFIRINOX 08/24/2017  CT abdomen/pelvis 09/03/2017-stable liver lesions and pancreas mass, stable right biliary dilatation, possible new 8 mm right liver lesion versus a vascular phenomena, lesion at the right paracolic gutter was present in the past-significance unclear  Cycle 6 FOLFIRINOX1/29/2019  Cycle7 FOLFIRINOX2/19/2019 (Irinotecan andOxaliplatin dose reduced due to thrombocytopenia)  CT abdomen/pelvis 10/25/2017- findings concerning for enterocolitis. No bowel obstruction. Ill-defined pancreatic head mass. Multiple hepatic hypoenhancing lesions with the largest measuring  1.9 x 2.0 cm, previously 1.7 x 1.7 cm; 9 x 10 mm hypoenhancing lesion in the inferior right lobe of the liver slightly larger compared to the previous CT; 1.6 x 2.4 cm enhancing lesion in the right paracolic gutter increased in size compared to the prior CT.  Cycle 1 gemcitabine/Abraxane 11/04/2017  Cycle 2 gemcitabine/Abraxane  11/25/2017  Cycle 3 gemcitabine/Abraxane 12/08/2017  2. Pain secondary to #1.Improved.  3. Weight losssecondary to #1.  4. Microcytic anemia  Transfused with  packed red blood cells 07/25/2017, 16 2019, and 11/27/2017  5. History of thrombocytopenia secondary to chemotherapy  6. Port-A-Cath placement 06/04/2017  7. Elevated liver enzymes/bilirubin10/22/2018-likely early biliary obstruction secondary to pancreas cancer, status post ERCP-placement of a common bile duct stent 06/09/2018  8.Postprandial abdominal pain. Protonix initiated 07/16/2017.Trial of Reglan initiated 08/24/2017.  9.Admission with fever 07/24/2017-no source for infection identified, resolved   10.Diarrheafollowing cycle 3 FOLFIRINOX-resolved  11.Left hydronephrosis noted on CT12/03/2017-etiology unclear, urology consulted-stent not recommended.Mild left hydronephrosis noted on CT 10/25/2017. Creatinine normal 10/27/2017.  12. Admission 08/31/2017 with a high fever, blood culture positive for gram-negative rods-Klebsiella pneumoniae identified  13.Fever 10/25/2017 status post evaluation in the emergency department. CT with findings concerning for enterocolitis.   14. Admission 11/11/2017 with a high fever-blood cultures (peripheral and Port-A-Cath) positive for gram-negative rods--STENOTROPHOMONAS MALTOPHILIA;course of Septra completed as an outpatient.  15. Rash-potentially secondary to gemcitabine or infection, improved  16. Pancytopenia secondary to chemotherapy;improved.  17.  Intermittent fever.  Question "tumor fever", question infection.     Disposition: Crystal Crystal is an improved performance status.  She no longer has fever.  She is not taking antibiotics.  The plan is to continue gemcitabine/Abraxane.  We will follow-up on the CA 19-9 from today.  She will return for an office visit and chemotherapy in 2 weeks.  She will discontinue the potassium supplement. Ms.  Crystal will contact us for increased rectal bleeding.  25 minutes were spent with the patient today.  The majority of the time was used for counseling and coordination of care.  Betsy Coder, MD  12/08/2017  9:02 AM

## 2017-12-09 LAB — CANCER ANTIGEN 19-9: CA 19-9: 2838 U/mL — ABNORMAL HIGH (ref 0–35)

## 2017-12-10 NOTE — Telephone Encounter (Signed)
Called to inform pt of approved requested refills on Ativan and Dilaudid, pt husband states "we should be good, I don't think we need those". This RN voiced understanding, will shred.

## 2017-12-11 ENCOUNTER — Telehealth: Payer: Self-pay | Admitting: Oncology

## 2017-12-11 NOTE — Telephone Encounter (Signed)
Sent ROI to MyChart and printed to be mailed to patient on 12/11/17, Release ID [id number]

## 2017-12-20 ENCOUNTER — Other Ambulatory Visit: Payer: Self-pay | Admitting: Oncology

## 2017-12-23 ENCOUNTER — Inpatient Hospital Stay: Payer: Medicaid Other

## 2017-12-23 ENCOUNTER — Telehealth: Payer: Self-pay | Admitting: *Deleted

## 2017-12-23 ENCOUNTER — Telehealth: Payer: Self-pay | Admitting: Oncology

## 2017-12-23 ENCOUNTER — Inpatient Hospital Stay: Payer: Medicaid Other | Attending: Nurse Practitioner

## 2017-12-23 ENCOUNTER — Inpatient Hospital Stay (HOSPITAL_BASED_OUTPATIENT_CLINIC_OR_DEPARTMENT_OTHER): Payer: Medicaid Other | Admitting: Oncology

## 2017-12-23 VITALS — BP 111/74 | HR 76 | Temp 98.2°F | Resp 17 | Ht 65.0 in | Wt 112.2 lb

## 2017-12-23 DIAGNOSIS — C787 Secondary malignant neoplasm of liver and intrahepatic bile duct: Secondary | ICD-10-CM

## 2017-12-23 DIAGNOSIS — C259 Malignant neoplasm of pancreas, unspecified: Secondary | ICD-10-CM

## 2017-12-23 DIAGNOSIS — R509 Fever, unspecified: Secondary | ICD-10-CM | POA: Insufficient documentation

## 2017-12-23 DIAGNOSIS — D61810 Antineoplastic chemotherapy induced pancytopenia: Secondary | ICD-10-CM

## 2017-12-23 DIAGNOSIS — D509 Iron deficiency anemia, unspecified: Secondary | ICD-10-CM | POA: Insufficient documentation

## 2017-12-23 DIAGNOSIS — C257 Malignant neoplasm of other parts of pancreas: Secondary | ICD-10-CM | POA: Insufficient documentation

## 2017-12-23 DIAGNOSIS — G893 Neoplasm related pain (acute) (chronic): Secondary | ICD-10-CM | POA: Diagnosis not present

## 2017-12-23 DIAGNOSIS — R188 Other ascites: Secondary | ICD-10-CM | POA: Diagnosis not present

## 2017-12-23 DIAGNOSIS — G629 Polyneuropathy, unspecified: Secondary | ICD-10-CM | POA: Insufficient documentation

## 2017-12-23 DIAGNOSIS — Z5111 Encounter for antineoplastic chemotherapy: Secondary | ICD-10-CM | POA: Insufficient documentation

## 2017-12-23 DIAGNOSIS — D6481 Anemia due to antineoplastic chemotherapy: Secondary | ICD-10-CM

## 2017-12-23 DIAGNOSIS — Z95828 Presence of other vascular implants and grafts: Secondary | ICD-10-CM

## 2017-12-23 DIAGNOSIS — R140 Abdominal distension (gaseous): Secondary | ICD-10-CM | POA: Insufficient documentation

## 2017-12-23 LAB — CBC WITH DIFFERENTIAL (CANCER CENTER ONLY)
BASOS ABS: 0 10*3/uL (ref 0.0–0.1)
Basophils Relative: 0 %
Eosinophils Absolute: 0.1 10*3/uL (ref 0.0–0.5)
Eosinophils Relative: 2 %
HEMATOCRIT: 24.4 % — AB (ref 34.8–46.6)
Hemoglobin: 7.5 g/dL — ABNORMAL LOW (ref 11.6–15.9)
Lymphocytes Relative: 14 %
Lymphs Abs: 0.7 10*3/uL — ABNORMAL LOW (ref 0.9–3.3)
MCH: 25.7 pg (ref 25.1–34.0)
MCHC: 30.7 g/dL — ABNORMAL LOW (ref 31.5–36.0)
MCV: 83.6 fL (ref 79.5–101.0)
Monocytes Absolute: 0.8 10*3/uL (ref 0.1–0.9)
Monocytes Relative: 16 %
NEUTROS ABS: 3.4 10*3/uL (ref 1.5–6.5)
Neutrophils Relative %: 68 %
Platelet Count: 149 10*3/uL (ref 145–400)
RBC: 2.92 MIL/uL — AB (ref 3.70–5.45)
RDW: 17.8 % — ABNORMAL HIGH (ref 11.2–14.5)
WBC: 5 10*3/uL (ref 3.9–10.3)

## 2017-12-23 LAB — CMP (CANCER CENTER ONLY)
ALK PHOS: 551 U/L — AB (ref 40–150)
ALT: 59 U/L — ABNORMAL HIGH (ref 0–55)
ANION GAP: 6 (ref 3–11)
AST: 53 U/L — ABNORMAL HIGH (ref 5–34)
Albumin: 2.4 g/dL — ABNORMAL LOW (ref 3.5–5.0)
BILIRUBIN TOTAL: 1 mg/dL (ref 0.2–1.2)
BUN: 12 mg/dL (ref 7–26)
CO2: 25 mmol/L (ref 22–29)
Calcium: 9.2 mg/dL (ref 8.4–10.4)
Chloride: 105 mmol/L (ref 98–109)
Creatinine: 0.81 mg/dL (ref 0.60–1.10)
GFR, Est AFR Am: >60 mL/min (ref >60)
Glucose, Bld: 118 mg/dL (ref 70–140)
POTASSIUM: 4.5 mmol/L (ref 3.5–5.1)
Sodium: 136 mmol/L (ref 136–145)
TOTAL PROTEIN: 7.7 g/dL (ref 6.4–8.3)

## 2017-12-23 MED ORDER — SULFAMETHOXAZOLE-TRIMETHOPRIM 800-160 MG PO TABS: 1.0000 | ORAL_TABLET | Freq: Every day | ORAL | 0 refills | Status: DC

## 2017-12-23 MED ORDER — SODIUM CHLORIDE 0.9% FLUSH
10.0000 mL | INTRAVENOUS | Status: DC | PRN
  Administered 2017-12-23: 10 mL
  Filled 2017-12-23: qty 10

## 2017-12-23 MED ORDER — PREDNISOLONE 5 MG PO TABS: 10.0000 mg | ORAL_TABLET | Freq: Every day | ORAL | 0 refills | Status: DC

## 2017-12-23 MED ORDER — GEMCITABINE HCL CHEMO INJECTION 1 GM/26.3ML
800.0000 mg/m2 | Freq: Once | INTRAVENOUS | Status: AC
  Administered 2017-12-23: 1254 mg via INTRAVENOUS
  Filled 2017-12-23: qty 32.98

## 2017-12-23 MED ORDER — SODIUM CHLORIDE 0.9 % IV SOLN
Freq: Once | INTRAVENOUS | Status: AC
  Administered 2017-12-23: 13:00:00 via INTRAVENOUS

## 2017-12-23 MED ORDER — GABAPENTIN 100 MG PO CAPS: 100.0000 mg | ORAL_CAPSULE | Freq: Two times a day (BID) | ORAL | 0 refills | Status: DC

## 2017-12-23 MED ORDER — PROCHLORPERAZINE MALEATE 10 MG PO TABS
10.0000 mg | ORAL_TABLET | Freq: Once | ORAL | Status: AC
  Administered 2017-12-23: 10 mg via ORAL

## 2017-12-23 MED ORDER — SODIUM CHLORIDE 0.9% FLUSH
10.0000 mL | Freq: Once | INTRAVENOUS | Status: AC
Start: 2017-12-23 — End: 2017-12-23
  Administered 2017-12-23: 10 mL
  Filled 2017-12-23: qty 10

## 2017-12-23 MED ORDER — LORAZEPAM 0.5 MG PO TABS: 0.5000 mg | ORAL_TABLET | Freq: Three times a day (TID) | ORAL | 0 refills | Status: DC | PRN

## 2017-12-23 MED ORDER — HYDROMORPHONE HCL 4 MG PO TABS: 4.0000 mg | ORAL_TABLET | ORAL | 0 refills | Status: DC | PRN

## 2017-12-23 MED ORDER — HEPARIN SOD (PORK) LOCK FLUSH 100 UNIT/ML IV SOLN
500.0000 [IU] | Freq: Once | INTRAVENOUS | Status: AC | PRN
  Administered 2017-12-23: 500 [IU]
  Filled 2017-12-23: qty 5

## 2017-12-23 MED ORDER — PROCHLORPERAZINE MALEATE 10 MG PO TABS
ORAL_TABLET | ORAL | Status: AC
  Filled 2017-12-23: qty 1

## 2017-12-23 MED FILL — GABAPENTIN 100 MG CAPSULE: 100 | 30 days supply | Qty: 60 | Fill #0

## 2017-12-23 MED FILL — SULFAMETHOXAZOLE-TMP DS TAB: 800-160 | 30 days supply | Qty: 30 | Fill #0

## 2017-12-23 MED FILL — predniSONE 5 MG TABS: 5 | 30 days supply | Qty: 60 | Fill #0

## 2017-12-23 NOTE — Telephone Encounter (Signed)
Called pt on cell phone and left message that refill scripts are ready for pick up.

## 2017-12-23 NOTE — Progress Notes (Signed)
[facility] OFFICE PROGRESS NOTE   Diagnosis: Pancreas cancer  INTERVAL HISTORY:   Crystal Carlson returns as scheduled.  She completed another cycle of gemcitabine/Abraxane on 12/08/2017.  She reports feeling well.  No pain.  She is not taking Dilaudid.  She takes MS Contin once daily.  She has developed increased neuropathy symptoms in the hands and feet.  She reports numbness and tingling in the hands and feet.  The feet are painful.  She is dropping objects. She has a low-grade fever.  No high fever.  She continues Bactrim prophylaxis.  She has a poor appetite.  She requests an appetite stimulant.  Objective:  Vital signs in last 24 hours:  Blood pressure 111/74, pulse 76, temperature 98.2 F (36.8 C), temperature source Oral, resp. rate 17, height 5\' 5"  (1.651 m), weight 112 lb 3.2 oz (50.9 kg), SpO2 100 %.    HEENT: No thrush.  Geographic tongue. Resp: Lungs clear bilaterally Cardio: Regular rate and rhythm GI: No hepatosplenomegaly, no mass, nontender Vascular: No leg edema Neuro: Mild loss of vibratory sense at the fingers, severe loss of vibratory sense of the toes.  Sensation is intact to light touch at the soles Skin: No rash  Portacath/PICC-without erythema  Lab Results:  Lab Results  Component Value Date   WBC 5.0 12/23/2017   HGB 7.5 (L) 12/23/2017   HCT 24.4 (L) 12/23/2017   MCV 83.6 12/23/2017   PLT 149 12/23/2017   NEUTROABS 3.4 12/23/2017    CMP     Component Value Date/Time   NA 136 12/23/2017 1039   NA 141 08/07/2017 1205   K 4.5 12/23/2017 1039   K 4.2 08/07/2017 1205   CL 105 12/23/2017 1039   CO2 25 12/23/2017 1039   CO2 25 08/07/2017 1205   GLUCOSE 118 12/23/2017 1039   GLUCOSE 114 08/07/2017 1205   BUN 12 12/23/2017 1039   BUN 9.6 08/07/2017 1205   CREATININE 0.81 12/23/2017 1039   CREATININE 0.8 08/07/2017 1205   CALCIUM 9.2 12/23/2017 1039   CALCIUM 9.1 08/07/2017 1205   PROT 7.7 12/23/2017 1039   PROT 6.7 08/07/2017 1205     ALBUMIN 2.4 (L) 12/23/2017 1039   ALBUMIN 3.0 (L) 08/07/2017 1205   AST 53 (H) 12/23/2017 1039   AST 58 (H) 08/07/2017 1205   ALT 59 (H) 12/23/2017 1039   ALT 59 (H) 08/07/2017 1205   ALKPHOS 551 (H) 12/23/2017 1039   ALKPHOS 520 (H) 08/07/2017 1205   BILITOT 1.0 12/23/2017 1039   BILITOT 0.42 08/07/2017 1205   GFRNONAA >60 12/23/2017 1039   GFRAA >60 12/23/2017 1039    CA 19-9 on 12/08/2017: 2838 Medications: I have reviewed the patient's current medications.   Assessment/Plan: 1. 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 Adenocarcinoma consistent with pancreatobiliary primary.  Cycle 1 FOLFIRINOX 06/18/2017  Cycle 2FOLFIRINOX 07/02/2017  Cycle 3 FOLFIRINOX 07/16/2017 (oxaliplatin dose reduced due to mild thrombocytopenia)  CT 07/24/2017-new splenomegaly and left hydronephrosis  Cycle 4 FOLFIRINOX 08/08/2017  Cycle 5 FOLFIRINOX 08/24/2017  CT abdomen/pelvis 09/03/2017-stable liver lesions and pancreas mass, stable right biliary dilatation, possible new 8 mm right liver lesion versus a  vascular phenomena, lesion at the right paracolic gutter was present in the past-significance unclear  Cycle 6 FOLFIRINOX1/29/2019  Cycle7 FOLFIRINOX2/19/2019 (Irinotecan andOxaliplatin dose reduced due to thrombocytopenia)  CT abdomen/pelvis 10/25/2017- findings concerning for enterocolitis. No bowel obstruction. Ill-defined pancreatic head mass. Multiple hepatic hypoenhancing lesions with the largest measuring 1.9 x 2.0 cm, previously 1.7 x 1.7 cm;  9 x 10 mm hypoenhancing lesion in the inferior right lobe of the liver slightly larger compared to the previous CT; 1.6 x 2.4 cm enhancing lesion in the right paracolic gutter increased in size compared to the prior CT.  Cycle 1 gemcitabine/Abraxane 11/04/2017  Cycle 2 gemcitabine/Abraxane 11/25/2017  Cycle 3 gemcitabine/Abraxane 12/08/2017  Cycle 4 gemcitabine 12/23/2017 (Abraxane held secondary to neuropathy)  2. Pain secondary to #1.Improved.  3. Weight losssecondary to #1.  4. Microcytic anemia  Transfused with  packed red blood cells 07/25/2017, 16 2019, and 11/27/2017  5. History of thrombocytopenia secondary to chemotherapy  6. Port-A-Cath placement 06/04/2017  7. Elevated liver enzymes/bilirubin10/22/2018-likely early biliary obstruction secondary to pancreas cancer, status post ERCP-placement of a common bile duct stent 06/09/2018  8.Postprandial abdominal pain. Protonix initiated 07/16/2017.Trial of Reglan initiated 08/24/2017.  9.Admission with fever 07/24/2017-no source for infection identified, resolved   10.Diarrheafollowing cycle 3 FOLFIRINOX-resolved  11.Left hydronephrosis noted on CT12/03/2017-etiology unclear, urology consulted-stent not recommended.Mild left hydronephrosis noted on CT 10/25/2017. Creatinine normal 10/27/2017.  12. Admission 08/31/2017 with a high fever, blood culture positive for gram-negative rods-Klebsiella pneumoniae identified  13.Fever 10/25/2017 status post evaluation in the emergency department. CT with findings concerning for enterocolitis.   14. Admission 11/11/2017 with a high fever-blood cultures (peripheral and Port-A-Cath) positive for gram-negative rods--STENOTROPHOMONAS MALTOPHILIA;course of Septra completed as an outpatient.  15. Rash-potentially secondary to gemcitabine or infection, improved  16. Pancytopenia secondary to chemotherapy  17.Intermittent fever. Question "tumor fever",  question infection. Now on Bactrim prophylaxis, improved  Disposition: Her overall performance status has improved.  The CA 19-9 was better 2 weeks ago.  She has developed neuropathy.  The neuropathy may be related to previous oxaliplatin therapy versus Abraxane.  We decided to hold Abraxane with the cycle of chemotherapy today.  She will begin a trial of gabapentin.  Crystal Carlson continue the current pain regimen.  She will begin a trial of prednisone as an appetite stimulant.  She will return for an office visit and chemotherapy in 2 weeks.  She has severe anemia secondary to chronic disease and chemotherapy.  We will arrange for a red cell transfusion if the hemoglobin is lower when she returns in 2 weeks.  25 minutes were spent with the patient today.  The majority of the time was used for counseling and coordination of care.  Betsy Coder, MD  12/23/2017  12:00 PM

## 2017-12-23 NOTE — Patient Instructions (Signed)
[facility] Discharge Instructions for Patients Receiving Chemotherapy  Today you received the following chemotherapy agents Gemzar To help prevent nausea and vomiting after your treatment, we encourage you to take your nausea medication as prescribed.  If you develop nausea and vomiting that is not controlled by your nausea medication, call the clinic.   BELOW ARE SYMPTOMS THAT SHOULD BE REPORTED IMMEDIATELY:  *FEVER GREATER THAN 100.5 F  *CHILLS WITH OR WITHOUT FEVER  NAUSEA AND VOMITING THAT IS NOT CONTROLLED WITH YOUR NAUSEA MEDICATION  *UNUSUAL SHORTNESS OF BREATH  *UNUSUAL BRUISING OR BLEEDING  TENDERNESS IN MOUTH AND THROAT WITH OR WITHOUT PRESENCE OF ULCERS  *URINARY PROBLEMS  *BOWEL PROBLEMS  UNUSUAL RASH Items with * indicate a potential emergency and should be followed up as soon as possible.  Feel free to call the clinic should you have any questions or concerns. The clinic phone number is [phone].  Please show the CHEMO ALERT CARD at check-in to the Emergency Department and triage nurse.   

## 2017-12-23 NOTE — Telephone Encounter (Signed)
Scheduled appt per 5/8 los - pt to get an updated schedule next visit.  

## 2017-12-23 NOTE — Addendum Note (Signed)
Addended by: Mathis Fare on: 12/23/2017 01:29 PM   Modules accepted: Orders

## 2017-12-23 NOTE — Patient Instructions (Signed)

## 2017-12-23 NOTE — Progress Notes (Signed)
Ok to treat with Hgb 7.5 per Dr. Benay Spice

## 2017-12-24 LAB — CANCER ANTIGEN 19-9: CA 19-9: 987 U/mL — ABNORMAL HIGH (ref 0–35)

## 2017-12-24 MED FILL — HYDROmorphone HCL 4 MG TABS: 4 | 3 days supply | Qty: 30 | Fill #0

## 2017-12-24 MED FILL — LORazepam 0.5 MG TABS: 0.5 | 10 days supply | Qty: 30 | Fill #0

## 2018-01-03 ENCOUNTER — Other Ambulatory Visit: Payer: Self-pay | Admitting: Oncology

## 2018-01-06 ENCOUNTER — Other Ambulatory Visit: Payer: Self-pay

## 2018-01-06 ENCOUNTER — Inpatient Hospital Stay (HOSPITAL_BASED_OUTPATIENT_CLINIC_OR_DEPARTMENT_OTHER): Payer: Medicaid Other | Admitting: Oncology

## 2018-01-06 ENCOUNTER — Telehealth: Payer: Self-pay | Admitting: Oncology

## 2018-01-06 ENCOUNTER — Inpatient Hospital Stay: Payer: Medicaid Other

## 2018-01-06 ENCOUNTER — Other Ambulatory Visit: Payer: Self-pay | Admitting: Oncology

## 2018-01-06 ENCOUNTER — Ambulatory Visit (HOSPITAL_COMMUNITY)
Admission: RE | Admit: 2018-01-06 | Discharge: 2018-01-06 | Disposition: A | Discharge status: Home / Self Care | Payer: Medicaid Other | Source: Ambulatory Visit | Attending: Oncology | Admitting: Oncology

## 2018-01-06 VITALS — BP 121/77 | HR 78 | Temp 98.4°F | Resp 18 | Ht 65.0 in | Wt 126.8 lb

## 2018-01-06 DIAGNOSIS — Z95828 Presence of other vascular implants and grafts: Secondary | ICD-10-CM

## 2018-01-06 DIAGNOSIS — C787 Secondary malignant neoplasm of liver and intrahepatic bile duct: Secondary | ICD-10-CM

## 2018-01-06 DIAGNOSIS — G893 Neoplasm related pain (acute) (chronic): Secondary | ICD-10-CM

## 2018-01-06 DIAGNOSIS — C259 Malignant neoplasm of pancreas, unspecified: Secondary | ICD-10-CM

## 2018-01-06 DIAGNOSIS — R140 Abdominal distension (gaseous): Secondary | ICD-10-CM

## 2018-01-06 DIAGNOSIS — Z5111 Encounter for antineoplastic chemotherapy: Secondary | ICD-10-CM | POA: Diagnosis not present

## 2018-01-06 DIAGNOSIS — C257 Malignant neoplasm of other parts of pancreas: Secondary | ICD-10-CM

## 2018-01-06 DIAGNOSIS — D509 Iron deficiency anemia, unspecified: Secondary | ICD-10-CM

## 2018-01-06 LAB — CMP (CANCER CENTER ONLY)
ALT: 44 U/L (ref 0–55)
ANION GAP: 6 (ref 3–11)
AST: 39 U/L — ABNORMAL HIGH (ref 5–34)
Albumin: 2.3 g/dL — ABNORMAL LOW (ref 3.5–5.0)
Alkaline Phosphatase: 325 U/L — ABNORMAL HIGH (ref 40–150)
BILIRUBIN TOTAL: 0.8 mg/dL (ref 0.2–1.2)
BUN: 13 mg/dL (ref 7–26)
CO2: 23 mmol/L (ref 22–29)
Calcium: 8.4 mg/dL (ref 8.4–10.4)
Chloride: 109 mmol/L (ref 98–109)
Creatinine: 0.69 mg/dL (ref 0.60–1.10)
Glucose, Bld: 137 mg/dL (ref 70–140)
Potassium: 3.7 mmol/L (ref 3.5–5.1)
SODIUM: 138 mmol/L (ref 136–145)
TOTAL PROTEIN: 6.8 g/dL (ref 6.4–8.3)

## 2018-01-06 LAB — CBC WITH DIFFERENTIAL (CANCER CENTER ONLY)
Basophils Absolute: 0 10*3/uL (ref 0.0–0.1)
Basophils Relative: 0 %
EOS ABS: 0.1 10*3/uL (ref 0.0–0.5)
Eosinophils Relative: 2 %
HEMATOCRIT: 22.4 % — AB (ref 34.8–46.6)
HEMOGLOBIN: 7.1 g/dL — AB (ref 11.6–15.9)
LYMPHS ABS: 0.7 10*3/uL — AB (ref 0.9–3.3)
LYMPHS PCT: 14 %
MCH: 27.1 pg (ref 25.1–34.0)
MCHC: 31.6 g/dL (ref 31.5–36.0)
MCV: 85.7 fL (ref 79.5–101.0)
MONOS PCT: 14 %
Monocytes Absolute: 0.7 10*3/uL (ref 0.1–0.9)
NEUTROS PCT: 70 %
Neutro Abs: 3.6 10*3/uL (ref 1.5–6.5)
Platelet Count: 90 10*3/uL — ABNORMAL LOW (ref 145–400)
RBC: 2.61 MIL/uL — ABNORMAL LOW (ref 3.70–5.45)
RDW: 21.7 % — ABNORMAL HIGH (ref 11.2–14.5)
WBC Count: 5.2 10*3/uL (ref 3.9–10.3)

## 2018-01-06 LAB — SAMPLE TO BLOOD BANK

## 2018-01-06 MED ORDER — SODIUM CHLORIDE 0.9% FLUSH
10.0000 mL | Freq: Once | INTRAVENOUS | Status: AC
  Administered 2018-01-06: 10 mL
  Filled 2018-01-06: qty 10

## 2018-01-06 MED ORDER — PROCHLORPERAZINE MALEATE 10 MG PO TABS
ORAL_TABLET | ORAL | Status: AC
Start: 2018-01-06 — End: ?
  Filled 2018-01-06: qty 1

## 2018-01-06 MED ORDER — MORPHINE SULFATE ER 15 MG PO TBCR: 15.0000 mg | EXTENDED_RELEASE_TABLET | Freq: Two times a day (BID) | ORAL | 0 refills | Status: DC

## 2018-01-06 MED ORDER — HEPARIN SOD (PORK) LOCK FLUSH 100 UNIT/ML IV SOLN
500.0000 [IU] | Freq: Once | INTRAVENOUS | Status: AC
  Administered 2018-01-06: 500 [IU]
  Filled 2018-01-06: qty 5

## 2018-01-06 MED FILL — MORPHINE SULF ER 15 MG TAB: 15 | 30 days supply | Qty: 60 | Fill #0

## 2018-01-06 NOTE — Telephone Encounter (Signed)
Scheduled appt per 5/22 los - pt is aware of appt date and time   

## 2018-01-06 NOTE — Progress Notes (Signed)
Patient ID: Crystal Carlson, female   DOB: [birth year], 52 y.o.   MRN: [id number] Patient presented to ultrasound department today for paracentesis.  On limited ultrasound of abdomen in all 4 quadrants there is minimal ascites present and no safe accessible window at this time.  Procedure canceled.  Patient notified.

## 2018-01-06 NOTE — Progress Notes (Signed)
[facility] OFFICE PROGRESS NOTE   Diagnosis: Pancreas cancer  INTERVAL HISTORY:   Ms. Debroux repleted another treatment with gemcitabine 12/23/2017.  No rash or fever.  Neuropathy symptoms are mildly improved.  She has developed abdominal distention and leg swelling over the past few days.  She has increased pain at the lower back.  She has resumed twice daily MS Contin.  She is taking Dilaudid twice daily for breakthrough pain.  Objective:  Vital signs in last 24 hours:  Blood pressure 121/77, pulse 78, temperature 98.4 F (36.9 C), temperature source Oral, resp. rate 18, height 5\' 5"  (1.651 m), weight 126 lb 12.8 oz (57.5 kg), SpO2 100 %.    HEENT: No thrush or ulcers Resp: Lungs clear bilaterally Cardio: Regular rate and rhythm GI: Distended, no mass, nontender Vascular: 1+ edema at the feet and ankles bilaterally Musculoskeletal: No tenderness at the lower back   Portacath/PICC-without erythema  Lab Results:  Lab Results  Component Value Date   WBC 5.2 01/06/2018   HGB 7.1 (L) 01/06/2018   HCT 22.4 (L) 01/06/2018   MCV 85.7 01/06/2018   PLT 90 (L) 01/06/2018   NEUTROABS 3.6 01/06/2018    CMP     Component Value Date/Time   NA 138 01/06/2018 0820   NA 141 08/07/2017 1205   K 3.7 01/06/2018 0820   K 4.2 08/07/2017 1205   CL 109 01/06/2018 0820   CO2 23 01/06/2018 0820   CO2 25 08/07/2017 1205   GLUCOSE 137 01/06/2018 0820   GLUCOSE 114 08/07/2017 1205   BUN 13 01/06/2018 0820   BUN 9.6 08/07/2017 1205   CREATININE 0.69 01/06/2018 0820   CREATININE 0.8 08/07/2017 1205   CALCIUM 8.4 01/06/2018 0820   CALCIUM 9.1 08/07/2017 1205   PROT 6.8 01/06/2018 0820   PROT 6.7 08/07/2017 1205   ALBUMIN 2.3 (L) 01/06/2018 0820   ALBUMIN 3.0 (L) 08/07/2017 1205   AST 39 (H) 01/06/2018 0820   AST 58 (H) 08/07/2017 1205   ALT 44 01/06/2018 0820   ALT 59 (H) 08/07/2017 1205   ALKPHOS 325 (H) 01/06/2018 0820   ALKPHOS 520 (H) 08/07/2017 1205   BILITOT  0.8 01/06/2018 0820   BILITOT 0.42 08/07/2017 1205   GFRNONAA >60 01/06/2018 0820   GFRAA >60 01/06/2018 0820     Medications: I have reviewed the patient's current medications.   Assessment/Plan: 1. 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 Adenocarcinoma consistent with pancreatobiliary primary.  Cycle 1 FOLFIRINOX 06/18/2017  Cycle 2FOLFIRINOX 07/02/2017  Cycle 3 FOLFIRINOX 07/16/2017 (oxaliplatin dose reduced due to mild thrombocytopenia)  CT 07/24/2017-new splenomegaly and left hydronephrosis  Cycle 4 FOLFIRINOX 08/08/2017  Cycle 5 FOLFIRINOX 08/24/2017  CT abdomen/pelvis 09/03/2017-stable liver lesions and pancreas mass, stable right biliary dilatation, possible new 8 mm right liver lesion versus a vascular phenomena, lesion at the right paracolic gutter was present in the past-significance unclear  Cycle 6 FOLFIRINOX1/29/2019  Cycle7 FOLFIRINOX2/19/2019 (Irinotecan andOxaliplatin dose reduced due to thrombocytopenia)  CT abdomen/pelvis 10/25/2017- findings concerning for enterocolitis. No bowel obstruction. Ill-defined pancreatic head mass. Multiple hepatic hypoenhancing lesions with the largest measuring 1.9 x 2.0 cm, previously 1.7 x  1.7 cm; 9 x 10 mm hypoenhancing lesion in the inferior right lobe of the liver slightly larger compared to the previous CT; 1.6 x 2.4 cm enhancing lesion in the right paracolic gutter increased in size compared to the prior CT.  Cycle 1 gemcitabine/Abraxane 11/04/2017  Cycle 2 gemcitabine/Abraxane  11/25/2017  Cycle 3 gemcitabine/Abraxane 12/08/2017  Cycle 4 gemcitabine 12/23/2017 (Abraxane held secondary to neuropathy)  2. Pain secondary to #1.Improved.  3. Weight losssecondary to #1.  4. Microcytic anemia  Transfused with packed red blood cells 07/25/2017, 16 2019, and 11/27/2017  5. History of thrombocytopenia secondary to chemotherapy  6. Port-A-Cath placement 06/04/2017  7. Elevated liver enzymes/bilirubin10/22/2018-likely early biliary obstruction secondary to pancreas cancer, status post ERCP-placement of a common bile duct stent 06/09/2018  8.Postprandial abdominal pain. Protonix initiated 07/16/2017.Trial of Reglan initiated 08/24/2017.  9.Admission with fever 07/24/2017-no source for infection identified, resolved   10.Diarrheafollowing cycle 3 FOLFIRINOX-resolved  11.Left hydronephrosis noted on CT12/03/2017-etiology unclear, urology consulted-stent not recommended.Mild left hydronephrosis noted on CT 10/25/2017. Creatinine normal 10/27/2017.  12. Admission 08/31/2017 with a high fever, blood culture positive for gram-negative rods-Klebsiella pneumoniae identified  13.Fever 10/25/2017 status post evaluation in the emergency department. CT with findings concerning for enterocolitis.   14. Admission 11/11/2017 with a high fever-blood cultures (peripheral and Port-A-Cath) positive for gram-negative rods--STENOTROPHOMONAS MALTOPHILIA;course of Septra completed as an outpatient.  15. Rash-potentially secondary to gemcitabine or infection, improved  16. Pancytopenia secondary to chemotherapy  17.Intermittent fever. Question "tumor fever", question infection. Now on Bactrim prophylaxis, improved     Disposition: Ms. Cokley has metastatic pancreas cancer.  She has developed abdominal distention and lower extremity edema over the past few weeks.  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 I  discussed the differential diagnosis with Ms. Garfield and her husband.  Chemotherapy will be held today.  She will be referred for a diagnostic/therapeutic paracentesis. She will return for an office visit on 01/08/2018.  25 minutes were spent with the patient today.  The majority of the time was used for counseling and coordination of care.  Betsy Coder, MD  01/06/2018  10:08 AM  Betsy Coder, MD  01/06/2018  8:47 AM

## 2018-01-07 LAB — CANCER ANTIGEN 19-9: CAN 19-9: 762 U/mL — AB (ref 0–35)

## 2018-01-08 ENCOUNTER — Encounter: Payer: Self-pay | Admitting: Nurse Practitioner

## 2018-01-08 ENCOUNTER — Inpatient Hospital Stay (HOSPITAL_BASED_OUTPATIENT_CLINIC_OR_DEPARTMENT_OTHER): Payer: Medicaid Other | Admitting: Nurse Practitioner

## 2018-01-08 ENCOUNTER — Telehealth: Payer: Self-pay | Admitting: Nurse Practitioner

## 2018-01-08 VITALS — BP 136/94 | HR 82 | Temp 98.4°F | Resp 18 | Ht 65.0 in | Wt 126.9 lb

## 2018-01-08 DIAGNOSIS — R188 Other ascites: Secondary | ICD-10-CM

## 2018-01-08 DIAGNOSIS — Z5111 Encounter for antineoplastic chemotherapy: Secondary | ICD-10-CM | POA: Diagnosis not present

## 2018-01-08 DIAGNOSIS — R140 Abdominal distension (gaseous): Secondary | ICD-10-CM

## 2018-01-08 DIAGNOSIS — D509 Iron deficiency anemia, unspecified: Secondary | ICD-10-CM

## 2018-01-08 DIAGNOSIS — C787 Secondary malignant neoplasm of liver and intrahepatic bile duct: Secondary | ICD-10-CM

## 2018-01-08 DIAGNOSIS — C257 Malignant neoplasm of other parts of pancreas: Secondary | ICD-10-CM

## 2018-01-08 DIAGNOSIS — C259 Malignant neoplasm of pancreas, unspecified: Secondary | ICD-10-CM

## 2018-01-08 NOTE — Progress Notes (Addendum)
[facility] OFFICE PROGRESS NOTE   Diagnosis: Pancreas cancer  INTERVAL HISTORY:   Crystal Carlson returns as scheduled.  The abdominal ultrasound on 01/06/2018 showed trace perihepatic ascites, insufficient for paracentesis.  She continues to have abdominal distention.  The leg swelling is better.  She notes early satiety.  She had a loose stool earlier today.  Pain is well controlled.  No fever.  Neuropathy symptoms are improved.  No significant nausea.  Objective:  Vital signs in last 24 hours:  Blood pressure (!) 136/94, pulse 82, temperature 98.4 F (36.9 C), temperature source Oral, resp. rate 18, height 5\' 5"  (1.651 m), weight 126 lb 14.4 oz (57.6 kg), SpO2 100 %.    HEENT: Mild white coating over tongue.  No buccal thrush. Resp: Lungs clear bilaterally. Cardio: Regular rate and rhythm. GI: Abdomen is distended.  No hepatomegaly.  No mass. Neuro: Alert and oriented. Skin: No rash. Port-A-Cath without erythema.   Lab Results:  Lab Results  Component Value Date   WBC 5.2 01/06/2018   HGB 7.1 (L) 01/06/2018   HCT 22.4 (L) 01/06/2018   MCV 85.7 01/06/2018   PLT 90 (L) 01/06/2018   NEUTROABS 3.6 01/06/2018    Imaging:  US Abdomen Limited  Result Date: 01/06/2018 CLINICAL DATA:  52 year old female with pancreatic cancer and abdominal distension concerning for ascites EXAM: LIMITED ABDOMEN ULTRASOUND FOR ASCITES TECHNIQUE: Limited ultrasound survey for ascites was performed in all four abdominal quadrants. COMPARISON:  Prior CT abdomen/pelvis 11/11/2017 FINDINGS: Sonographic interrogation of the 4 quadrants of the abdomen demonstrates trace perihepatic ascites. There is insufficient fluid for safe paracentesis. Paracentesis was not performed. IMPRESSION: Trace perihepatic ascites, insufficient for paracentesis. Electronically Signed   By: Jacqulynn Cadet M.D.   On: 01/06/2018 13:54    Medications: I have reviewed the patient's current  medications.  Assessment/Plan: 1. 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 Adenocarcinoma consistent with pancreatobiliary primary.  Cycle 1 FOLFIRINOX 06/18/2017  Cycle 2FOLFIRINOX 07/02/2017  Cycle 3 FOLFIRINOX 07/16/2017 (oxaliplatin dose reduced due to mild thrombocytopenia)  CT 07/24/2017-new splenomegaly and left hydronephrosis  Cycle 4 FOLFIRINOX 08/08/2017  Cycle 5 FOLFIRINOX 08/24/2017  CT abdomen/pelvis 09/03/2017-stable liver lesions and pancreas mass, stable right biliary dilatation, possible new 8 mm right liver lesion versus a vascular phenomena, lesion at the right paracolic gutter was present in the past-significance unclear  Cycle 6 FOLFIRINOX1/29/2019  Cycle7 FOLFIRINOX2/19/2019 (Irinotecan andOxaliplatin dose reduced due to thrombocytopenia)  CT abdomen/pelvis 10/25/2017- findings concerning for enterocolitis. No bowel obstruction. Ill-defined pancreatic head mass. Multiple hepatic hypoenhancing lesions with the largest measuring 1.9 x 2.0 cm, previously 1.7 x 1.7 cm; 9 x 10 mm hypoenhancing lesion in the inferior right lobe of the liver slightly larger compared to the previous CT; 1.6 x 2.4 cm enhancing lesion in the right paracolic gutter increased in size compared to the prior CT.  Cycle 1 gemcitabine/Abraxane 11/04/2017  Cycle 2 gemcitabine/Abraxane 11/25/2017  Cycle 3 gemcitabine/Abraxane 12/08/2017  Cycle 4 gemcitabine 12/23/2017 (Abraxane held secondary to neuropathy)  2. Pain secondary to #1.Improved.  3.  Weight losssecondary to #1.  4. Microcytic anemia  Transfused with packed red blood cells 07/25/2017, 16 2019,  and 11/27/2017  5. History of thrombocytopenia secondary to chemotherapy  6. Port-A-Cath placement 06/04/2017  7. Elevated liver enzymes/bilirubin10/22/2018-likely early biliary obstruction secondary to pancreas cancer, status post ERCP-placement of a common bile duct stent 06/09/2018  8.Postprandial abdominal pain. Protonix initiated 07/16/2017.Trial of Reglan initiated 08/24/2017.  9.Admission with fever 07/24/2017-no source for infection identified, resolved   10.Diarrheafollowing cycle 3 FOLFIRINOX-resolved  11.Left hydronephrosis noted on CT12/03/2017-etiology unclear, urology consulted-stent not recommended.Mild left hydronephrosis noted on CT 10/25/2017. Creatinine normal 10/27/2017.  12. Admission 08/31/2017 with a high fever, blood culture positive for gram-negative rods-Klebsiella pneumoniae identified  13.Fever 10/25/2017 status post evaluation in the emergency department. CT with findings concerning for enterocolitis.   14. Admission 11/11/2017 with a high fever-blood cultures (peripheral and Port-A-Cath) positive for gram-negative rods--STENOTROPHOMONAS MALTOPHILIA;course of Septra completed as an outpatient.  15. Rash-potentially secondary to gemcitabine or infection, improved  16. Pancytopenia secondary to chemotherapy  17.Intermittent fever. Question "tumor fever", question infection. Now on Bactrim prophylaxis, improved    Disposition: Crystal Carlson appears unchanged.  She continues to have significant abdominal distention.  The recent abdominal ultrasound showed only trace ascites.  Plan to continue to hold chemotherapy and refer for restaging CT scans.  She will return for a follow-up visit 01/13/2018 to review the results and make a treatment decision.  She will contact the office in the interim with any  problems.  Patient seen with Dr. Benay Spice.  25 minutes were spent face-to-face at today's visit with the majority of that time involved in counseling/coordination of care.    [name] ANP/GNP-BC   01/08/2018  9:03 AM  This was a shared visit with [name].  Crystal Carlson was interviewed and examined.  She has persistent abdominal distention.  The ultrasound on 01/06/2018 did not reveal significant ascites.  The etiology of the abdominal distention is unclear.  The CA 19-9 was lower on 01/06/2018.  She will be referred for a restaging CT.  [name], MD

## 2018-01-08 NOTE — Telephone Encounter (Signed)
Scheduled appt per 5/24 los - Gave patient aVS and calender per los. - unable to schedule treatment due to capped day -  Logged - will call when appt added.

## 2018-01-12 ENCOUNTER — Encounter (HOSPITAL_COMMUNITY): Payer: Self-pay

## 2018-01-12 ENCOUNTER — Ambulatory Visit (HOSPITAL_COMMUNITY)
Admission: RE | Admit: 2018-01-12 | Discharge: 2018-01-12 | Disposition: A | Discharge status: Home / Self Care | Payer: Medicaid Other | Source: Ambulatory Visit | Attending: Nurse Practitioner | Admitting: Nurse Practitioner

## 2018-01-12 DIAGNOSIS — R9389 Abnormal findings on diagnostic imaging of other specified body structures: Secondary | ICD-10-CM | POA: Insufficient documentation

## 2018-01-12 DIAGNOSIS — C787 Secondary malignant neoplasm of liver and intrahepatic bile duct: Secondary | ICD-10-CM | POA: Insufficient documentation

## 2018-01-12 DIAGNOSIS — R188 Other ascites: Secondary | ICD-10-CM | POA: Diagnosis not present

## 2018-01-12 DIAGNOSIS — C259 Malignant neoplasm of pancreas, unspecified: Secondary | ICD-10-CM | POA: Diagnosis not present

## 2018-01-12 MED ORDER — IOPAMIDOL (ISOVUE-300) INJECTION 61%
100.0000 mL | Freq: Once | INTRAVENOUS | Status: AC | PRN
  Administered 2018-01-12: 100 mL via INTRAVENOUS

## 2018-01-12 MED ORDER — IOPAMIDOL (ISOVUE-300) INJECTION 61%
INTRAVENOUS | Status: AC
  Filled 2018-01-12: qty 100

## 2018-01-13 ENCOUNTER — Inpatient Hospital Stay: Payer: Medicaid Other

## 2018-01-13 ENCOUNTER — Encounter: Payer: Self-pay | Admitting: Nurse Practitioner

## 2018-01-13 ENCOUNTER — Inpatient Hospital Stay (HOSPITAL_BASED_OUTPATIENT_CLINIC_OR_DEPARTMENT_OTHER): Payer: Medicaid Other | Admitting: Nurse Practitioner

## 2018-01-13 ENCOUNTER — Telehealth: Payer: Self-pay | Admitting: Emergency Medicine

## 2018-01-13 ENCOUNTER — Telehealth: Payer: Self-pay | Admitting: Oncology

## 2018-01-13 ENCOUNTER — Encounter: Payer: Self-pay | Admitting: Oncology

## 2018-01-13 VITALS — BP 130/81 | HR 76 | Temp 98.8°F | Resp 18 | Ht 65.0 in | Wt 127.7 lb

## 2018-01-13 DIAGNOSIS — C787 Secondary malignant neoplasm of liver and intrahepatic bile duct: Secondary | ICD-10-CM

## 2018-01-13 DIAGNOSIS — D61810 Antineoplastic chemotherapy induced pancytopenia: Secondary | ICD-10-CM

## 2018-01-13 DIAGNOSIS — C257 Malignant neoplasm of other parts of pancreas: Secondary | ICD-10-CM

## 2018-01-13 DIAGNOSIS — C259 Malignant neoplasm of pancreas, unspecified: Secondary | ICD-10-CM

## 2018-01-13 DIAGNOSIS — G629 Polyneuropathy, unspecified: Secondary | ICD-10-CM

## 2018-01-13 DIAGNOSIS — R509 Fever, unspecified: Secondary | ICD-10-CM

## 2018-01-13 DIAGNOSIS — D509 Iron deficiency anemia, unspecified: Secondary | ICD-10-CM

## 2018-01-13 DIAGNOSIS — R140 Abdominal distension (gaseous): Secondary | ICD-10-CM

## 2018-01-13 DIAGNOSIS — Z5111 Encounter for antineoplastic chemotherapy: Secondary | ICD-10-CM | POA: Diagnosis not present

## 2018-01-13 DIAGNOSIS — R188 Other ascites: Secondary | ICD-10-CM

## 2018-01-13 DIAGNOSIS — Z95828 Presence of other vascular implants and grafts: Secondary | ICD-10-CM

## 2018-01-13 DIAGNOSIS — G893 Neoplasm related pain (acute) (chronic): Secondary | ICD-10-CM

## 2018-01-13 LAB — SAMPLE TO BLOOD BANK

## 2018-01-13 LAB — CBC WITH DIFFERENTIAL (CANCER CENTER ONLY)
BASOS ABS: 0 10*3/uL (ref 0.0–0.1)
Basophils Relative: 0 %
EOS PCT: 2 %
Eosinophils Absolute: 0.1 10*3/uL (ref 0.0–0.5)
HEMATOCRIT: 26.5 % — AB (ref 34.8–46.6)
Hemoglobin: 8.1 g/dL — ABNORMAL LOW (ref 11.6–15.9)
LYMPHS ABS: 0.4 10*3/uL — AB (ref 0.9–3.3)
LYMPHS PCT: 7 %
MCH: 27.4 pg (ref 25.1–34.0)
MCHC: 30.6 g/dL — ABNORMAL LOW (ref 31.5–36.0)
MCV: 89.5 fL (ref 79.5–101.0)
MONO ABS: 0.7 10*3/uL (ref 0.1–0.9)
Monocytes Relative: 12 %
NEUTROS ABS: 4.9 10*3/uL (ref 1.5–6.5)
Neutrophils Relative %: 79 %
PLATELETS: 111 10*3/uL — AB (ref 145–400)
RBC: 2.96 MIL/uL — AB (ref 3.70–5.45)
RDW: 24.3 % — AB (ref 11.2–14.5)
WBC Count: 6.3 10*3/uL (ref 3.9–10.3)

## 2018-01-13 MED ORDER — SODIUM CHLORIDE 0.9% FLUSH
10.0000 mL | Freq: Once | INTRAVENOUS | Status: AC
  Administered 2018-01-13: 10 mL
  Filled 2018-01-13: qty 10

## 2018-01-13 MED ORDER — PROCHLORPERAZINE MALEATE 10 MG PO TABS
ORAL_TABLET | ORAL | Status: AC
Start: 2018-01-13 — End: ?
  Filled 2018-01-13: qty 1

## 2018-01-13 MED ORDER — SODIUM CHLORIDE 0.9 % IV SOLN
Freq: Once | INTRAVENOUS | Status: AC
  Administered 2018-01-13: 16:00:00 via INTRAVENOUS

## 2018-01-13 MED ORDER — HEPARIN SOD (PORK) LOCK FLUSH 100 UNIT/ML IV SOLN
500.0000 [IU] | Freq: Once | INTRAVENOUS | Status: AC | PRN
  Administered 2018-01-13: 500 [IU]
  Filled 2018-01-13: qty 5

## 2018-01-13 MED ORDER — SODIUM CHLORIDE 0.9% FLUSH
10.0000 mL | INTRAVENOUS | Status: DC | PRN
  Administered 2018-01-13: 10 mL
  Filled 2018-01-13: qty 10

## 2018-01-13 MED ORDER — PROCHLORPERAZINE MALEATE 10 MG PO TABS
10.0000 mg | ORAL_TABLET | Freq: Once | ORAL | Status: AC
  Administered 2018-01-13: 10 mg via ORAL

## 2018-01-13 MED ORDER — GEMCITABINE HCL CHEMO INJECTION 1 GM/26.3ML
800.0000 mg/m2 | Freq: Once | INTRAVENOUS | Status: AC
  Administered 2018-01-13: 1254 mg via INTRAVENOUS
  Filled 2018-01-13: qty 32.98

## 2018-01-13 MED ORDER — SULFAMETHOXAZOLE-TRIMETHOPRIM 800-160 MG PO TABS: 1.0000 | ORAL_TABLET | Freq: Every day | ORAL | 1 refills | Status: DC

## 2018-01-13 MED FILL — SULFAMETHOXAZOLE-TMP DS TAB: 800-160 | 30 days supply | Qty: 30 | Fill #0

## 2018-01-13 NOTE — Telephone Encounter (Signed)
Scheduled appt per 5/29 los - patient to get an updated schedule next visit.

## 2018-01-13 NOTE — Progress Notes (Signed)
Received email from [facility] in registration regarding patient being uninsured. Reached out to [facility] on hospital side regarding Family Planning Medicaid vs full adult Medicaid. Ana reviewed status and saw patient was approved for retro and ongoing with a deductible. Wilhemena Durie will send qualifying bills to hospital DSS worker to submit for Medicaid payments. Worker will contact patient if additional information is needed. Patient is self-pay for now. Ana removed the family planning Medicaid from coverage.

## 2018-01-13 NOTE — Telephone Encounter (Signed)
Ok to tx w/o cmet today per [employer] np. RN Selinda Orion made aware.

## 2018-01-13 NOTE — Progress Notes (Signed)
[facility] OFFICE PROGRESS NOTE   Diagnosis: Pancreas cancer  INTERVAL HISTORY:   Crystal Carlson returns as scheduled.  She continues to note abdominal distention.  The distention is uncomfortable.  She denies nausea.  No significant diarrhea.  She has persistent numbness in the fingertips and toes.  Objective:  Vital signs in last 24 hours:  Blood pressure 130/81, pulse 76, temperature 98.8 F (37.1 C), temperature source Oral, resp. rate 18, height 5\' 5"  (1.651 m), weight 127 lb 11.2 oz (57.9 kg), SpO2 98 %.    HEENT: No thrush or ulcers. Resp: Lungs clear bilaterally. Cardio: Regular rate and rhythm. GI: Abdomen is distended consistent with ascites. Vascular: Trace edema at the ankles bilaterally. Port-A-Cath without erythema.   Lab Results:  Lab Results  Component Value Date   WBC 6.3 01/13/2018   HGB 8.1 (L) 01/13/2018   HCT 26.5 (L) 01/13/2018   MCV 89.5 01/13/2018   PLT 111 (L) 01/13/2018   NEUTROABS 4.9 01/13/2018    Imaging:  Ct Abdomen Pelvis W Contrast  Result Date: 01/13/2018 CLINICAL DATA:  Restaging pancreatic cancer. EXAM: CT ABDOMEN AND PELVIS WITH CONTRAST TECHNIQUE: Multidetector CT imaging of the abdomen and pelvis was performed using the standard protocol following bolus administration of intravenous contrast. CONTRAST:  154mL ISOVUE-300 IOPAMIDOL (ISOVUE-300) INJECTION 61% COMPARISON:  Multiple previous CT scans. The most recent is 11/11/2017 FINDINGS: Lower chest: The heart is normal in size. No pericardial effusion. No pulmonary lesions to suggest metastatic disease. Eventration of the right hemidiaphragm with overlying subsegmental atelectasis. No pleural effusion. Hepatobiliary: Stable common bile duct stent with pneumobilia. Metastatic pulmonary lesions are slightly smaller. Segment 8 liver lesion on image number 27 measures 17 x 16 mm and previously measured 23.5 x 17.5 mm. Right hepatic lobe lesion on image number 35 in segment 6  measures 13 x 10 mm and previously measured 18 x 12.5 mm. 7 mm lesion more inferiorly in segment 6 appears stable. No new or progressive liver lesions. Pancreas: The pancreatic head mass is difficult to measure exactly but definitely appears smaller when compared to the prior CT examination. My best estimate is the mass measures 4.2 x 3.0 cm and previously measured 6.3 x 4.3 cm. Stable severe atrophy of the pancreatic body and tail and marked dilatation of the main pancreatic duct to the level of the mass. Spleen: Stable splenomegaly due to chronic splenic vein inclusion and portal vein occlusion with cavernous transformation. Extensive portal venous collaterals. Adrenals/Urinary Tract: The adrenal glands are unremarkable. The left kidney demonstrates patchy areas of peripheral decreased perfusion. This is typically seen with pyelonephritis recommend correlation with urinalysis. The right kidney is unremarkable. No renal or obstructing upper ureteral calculi. Stomach/Bowel: The stomach, duodenum, small bowel and colon are grossly normal. Mild diffuse colonic wall thickening and some areas of small bowel wall thickening may be due to the surrounding ascites and low albumin. No mass or obstruction. Vascular/Lymphatic: The aorta and branch vessels are patent. 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 Stable small peripancreatic lymph nodes. Index node on image number 44 at the level of the celiac axis measures 8 mm and is unchanged. The peritoneal implant in the right pericolic gutter is again demonstrated. It is difficult to measure exactly but the more anterior enhancing portion of the lesion appears smaller. Reproductive: Thickened ill-defined endometrium measuring up to 25 mm. Could not exclude endometrial hyperplasia or endometrial cancer. Pelvic ultrasound may be helpful for further evaluation. Both  ovaries appear normal. Other: Large amount of free pelvic fluid.  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 No definite metastatic implants or adenopathy in the pelvis. No inguinal adenopathy. Musculoskeletal: No significant bony findings. IMPRESSION: 1. Interval decrease in size of the pancreatic mass and the hepatic metastasis. 2. Slight interval decrease in size of the right paracolic gutter implant. 3. Worsening abdominal/pelvic ascites. Bowel wall thickening is also likely due to the ascites and low albumin. 4. Chronic portal and splenic vein inclusion with cavernous transformation of the portal vein and extensive portal venous collaterals. 5. Thickened endometrium possibly due to endometrial hyperplasia. Recommend pelvic ultrasound examination for further evaluation. Electronically Signed   By: Marijo Sanes M.D.   On: 01/13/2018 10:08    Medications: I have reviewed the patient's current medications.  Assessment/Plan: 1. 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 Adenocarcinoma consistent with pancreatobiliary primary.  Cycle 1 FOLFIRINOX 06/18/2017  Cycle 2FOLFIRINOX 07/02/2017  Cycle 3 FOLFIRINOX 07/16/2017 (oxaliplatin dose reduced due to mild thrombocytopenia)  CT 07/24/2017-new splenomegaly and left hydronephrosis  Cycle 4 FOLFIRINOX 08/08/2017  Cycle 5 FOLFIRINOX 08/24/2017  CT abdomen/pelvis 09/03/2017-stable liver lesions and pancreas mass, stable right biliary dilatation, possible new 8 mm right liver lesion versus a vascular phenomena, lesion at the right paracolic gutter was  present in the past-significance unclear  Cycle 6 FOLFIRINOX1/29/2019  Cycle7 FOLFIRINOX2/19/2019 (Irinotecan andOxaliplatin dose reduced due to thrombocytopenia)  CT abdomen/pelvis 10/25/2017- findings concerning for enterocolitis. No bowel obstruction. Ill-defined pancreatic head mass. Multiple hepatic hypoenhancing lesions with the largest measuring 1.9 x 2.0 cm, previously 1.7 x 1.7 cm; 9 x 10 mm hypoenhancing lesion in the inferior right lobe of the liver slightly larger compared to the previous CT; 1.6 x 2.4 cm enhancing lesion in the right paracolic gutter increased in size compared to the prior CT.  Cycle 1 gemcitabine/Abraxane 11/04/2017  Cycle 2 gemcitabine/Abraxane 11/25/2017  Cycle 3 gemcitabine/Abraxane 12/08/2017  Cycle 4 gemcitabine 12/23/2017 (Abraxane held secondary to neuropathy)  CTs abdomen/pelvis 01/12/2018- decrease in size of the pancreatic mass and hepatic metastasis; slight interval decrease in size of right paracolic gutter implant; worsening abdominal/pelvic ascites.  Bowel wall thickening likely due to ascites and low albumin.  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 Pain secondary to #1.Improved.  3. Weight losssecondary to #1.  4. Microcytic anemia  Transfused with packed red blood cells 07/25/2017, 16 2019, and 11/27/2017  5. History of thrombocytopenia secondary to chemotherapy  6. Port-A-Cath placement 06/04/2017  7. Elevated liver enzymes/bilirubin10/22/2018-likely early biliary obstruction secondary to pancreas cancer, status post ERCP-placement of a common bile duct stent 06/09/2018  8.Postprandial abdominal pain. Protonix initiated 07/16/2017.Trial of Reglan initiated 08/24/2017.  9.Admission with fever 07/24/2017-no source for infection identified, resolved   10.Diarrheafollowing cycle 3  FOLFIRINOX-resolved  11.Left hydronephrosis noted on CT12/03/2017-etiology unclear, urology consulted-stent not recommended.Mild left hydronephrosis noted on CT 10/25/2017. Creatinine normal 10/27/2017.  12. Admission 08/31/2017 with a high fever, blood culture positive for gram-negative rods-Klebsiella pneumoniae identified  13.Fever 10/25/2017 status post evaluation in the emergency department. CT with findings concerning for enterocolitis.   14.  Admission 11/11/2017 with a high fever-blood cultures (peripheral and Port-A-Cath) positive for gram-negative rods--STENOTROPHOMONAS MALTOPHILIA;course of Septra completed as an outpatient.  15. Rash-potentially secondary to gemcitabine or infection, improved  16. Pancytopenia secondary to chemotherapy  17.Intermittent fever. Question "tumor fever", question infection. Now on Bactrim prophylaxis, improved    Disposition: Crystal Carlson appears unchanged.  The recent restaging CTs show improvement in the pancreas mass and liver lesions.  Plan to proceed with gemcitabine alone today as scheduled.  Continue to hold Abraxane due to neuropathy symptoms.  She has persistent abdominal distention.  CT scan confirms significant ascites.  We are referring her for a paracentesis and will send fluid for cytology.  She will return for lab, follow-up in the next cycle of chemotherapy in 2 weeks.  She will contact the office in the interim with any problems.  Patient seen with Dr. Benay Spice.  CT images reviewed on the computer with Crystal Carlson and her husband.  25 minutes were spent face-to-face at today's visit with the majority of that time involved in counseling/coordination of care.   [name] ANP/GNP-BC   01/13/2018  3:07 PM This was a shared visit with [name].  Crystal Carlson was interviewed and examined.  The restaging CT is consistent with a response to gemcitabine/Abraxane.  The CA 19-9 is lower.  She has developed large volume  ascites.  We will refer her for a diagnostic/therapeutic paracentesis.  The plan is to continue gemcitabine chemotherapy.  [name], MD

## 2018-01-13 NOTE — Patient Instructions (Signed)
[facility] °Discharge Instructions for Patients Receiving Chemotherapy ° °Today you received the following chemotherapy agents Gemzar ° °To help prevent nausea and vomiting after your treatment, we encourage you to take your nausea medication as directed. °  °If you develop nausea and vomiting that is not controlled by your nausea medication, call the clinic.  ° °BELOW ARE SYMPTOMS THAT SHOULD BE REPORTED IMMEDIATELY: °· *FEVER GREATER THAN 100.5 F °· *CHILLS WITH OR WITHOUT FEVER °· NAUSEA AND VOMITING THAT IS NOT CONTROLLED WITH YOUR NAUSEA MEDICATION °· *UNUSUAL SHORTNESS OF BREATH °· *UNUSUAL BRUISING OR BLEEDING °· TENDERNESS IN MOUTH AND THROAT WITH OR WITHOUT PRESENCE OF ULCERS °· *URINARY PROBLEMS °· *BOWEL PROBLEMS °· UNUSUAL RASH °Items with * indicate a potential emergency and should be followed up as soon as possible. ° °Feel free to call the clinic should you have any questions or concerns. The clinic phone number is [phone]. ° °Please show the CHEMO ALERT CARD at check-in to the Emergency Department and triage nurse. ° ° °

## 2018-01-14 ENCOUNTER — Ambulatory Visit (HOSPITAL_COMMUNITY)
Admission: RE | Admit: 2018-01-14 | Discharge: 2018-01-14 | Disposition: A | Discharge status: Home / Self Care | Payer: Medicaid Other | Source: Ambulatory Visit | Attending: Nurse Practitioner | Admitting: Nurse Practitioner

## 2018-01-14 DIAGNOSIS — C259 Malignant neoplasm of pancreas, unspecified: Secondary | ICD-10-CM | POA: Diagnosis present

## 2018-01-14 DIAGNOSIS — R188 Other ascites: Secondary | ICD-10-CM | POA: Insufficient documentation

## 2018-01-14 DIAGNOSIS — C787 Secondary malignant neoplasm of liver and intrahepatic bile duct: Secondary | ICD-10-CM | POA: Insufficient documentation

## 2018-01-14 DIAGNOSIS — K659 Peritonitis, unspecified: Secondary | ICD-10-CM | POA: Diagnosis not present

## 2018-01-14 LAB — GRAM STAIN

## 2018-01-14 LAB — BODY FLUID CELL COUNT WITH DIFFERENTIAL
LYMPHS FL: 8 %
Monocyte-Macrophage-Serous Fluid: 45 % — ABNORMAL LOW (ref 50–90)
NEUTROPHIL FLUID: 47 % — AB (ref 0–25)
Total Nucleated Cell Count, Fluid: 828 cu mm (ref 0–1000)

## 2018-01-14 MED ORDER — LIDOCAINE HCL 1 % IJ SOLN
INTRAMUSCULAR | Status: AC
  Filled 2018-01-14: qty 10

## 2018-01-14 NOTE — Procedures (Signed)
Ultrasound-guided diagnostic and therapeutic paracentesis performed yielding 2.4 liters of hazy amber/blood-tinged  fluid. No immediate complications. The fluid was sent to the lab for preordered studies.

## 2018-01-15 ENCOUNTER — Telehealth: Payer: Self-pay | Admitting: *Deleted

## 2018-01-15 NOTE — Telephone Encounter (Signed)
Peritoneal culture came back gram positive. Per Dr. Benay Spice continue Bactrim and call with any chills or fever or go to nearest ED. Generic message left for return call in mobile number.

## 2018-01-17 LAB — CULTURE, BODY FLUID-BOTTLE

## 2018-01-17 LAB — CULTURE, BODY FLUID W GRAM STAIN -BOTTLE

## 2018-01-18 ENCOUNTER — Other Ambulatory Visit: Payer: Self-pay | Admitting: Emergency Medicine

## 2018-01-18 ENCOUNTER — Telehealth: Payer: Self-pay

## 2018-01-18 MED ORDER — AMOXICILLIN-POT CLAVULANATE 500-125 MG PO TABS: 500.0000 mg | ORAL_TABLET | Freq: Two times a day (BID) | ORAL | 0 refills | Status: DC

## 2018-01-18 MED FILL — AMOX-CLAV 500-125 MG TABLET: 500-125 | 10 days supply | Qty: 20 | Fill #0

## 2018-01-18 NOTE — Progress Notes (Signed)
Spoke to pt, informed her of positive culture per Marlynn Perking. Antibiotic sent in to [facility]. Pt knows to call for any increased symptoms. Pt verbalized understanding of instructions.

## 2018-01-18 NOTE — Telephone Encounter (Signed)
Returned call to pt husband regarding antibiotics. Husband questions "do we still need to take the Septra now that we have Augmentin"? Per Dr. Benay Spice, pt to stop Bactrim and start Augmentin. Pt husband voiced understanding.

## 2018-01-24 ENCOUNTER — Other Ambulatory Visit: Payer: Self-pay | Admitting: Oncology

## 2018-01-27 ENCOUNTER — Inpatient Hospital Stay: Payer: Medicaid Other

## 2018-01-27 ENCOUNTER — Telehealth: Payer: Self-pay | Admitting: Oncology

## 2018-01-27 ENCOUNTER — Inpatient Hospital Stay (HOSPITAL_BASED_OUTPATIENT_CLINIC_OR_DEPARTMENT_OTHER): Payer: Medicaid Other | Admitting: Nurse Practitioner

## 2018-01-27 ENCOUNTER — Inpatient Hospital Stay: Payer: Medicaid Other | Admitting: Nutrition

## 2018-01-27 ENCOUNTER — Inpatient Hospital Stay: Payer: Medicaid Other | Attending: Nurse Practitioner

## 2018-01-27 ENCOUNTER — Encounter: Payer: Self-pay | Admitting: Nurse Practitioner

## 2018-01-27 VITALS — BP 141/94 | HR 74 | Temp 98.5°F | Resp 18 | Ht 65.0 in | Wt 117.1 lb

## 2018-01-27 DIAGNOSIS — C257 Malignant neoplasm of other parts of pancreas: Secondary | ICD-10-CM | POA: Insufficient documentation

## 2018-01-27 DIAGNOSIS — C259 Malignant neoplasm of pancreas, unspecified: Secondary | ICD-10-CM

## 2018-01-27 DIAGNOSIS — C787 Secondary malignant neoplasm of liver and intrahepatic bile duct: Secondary | ICD-10-CM

## 2018-01-27 DIAGNOSIS — Z95828 Presence of other vascular implants and grafts: Secondary | ICD-10-CM

## 2018-01-27 DIAGNOSIS — R634 Abnormal weight loss: Secondary | ICD-10-CM | POA: Insufficient documentation

## 2018-01-27 DIAGNOSIS — B961 Klebsiella pneumoniae [K. pneumoniae] as the cause of diseases classified elsewhere: Secondary | ICD-10-CM

## 2018-01-27 DIAGNOSIS — B952 Enterococcus as the cause of diseases classified elsewhere: Secondary | ICD-10-CM | POA: Diagnosis not present

## 2018-01-27 DIAGNOSIS — G893 Neoplasm related pain (acute) (chronic): Secondary | ICD-10-CM | POA: Diagnosis not present

## 2018-01-27 LAB — CBC WITH DIFFERENTIAL (CANCER CENTER ONLY)
Basophils Absolute: 0 10*3/uL (ref 0.0–0.1)
Basophils Relative: 1 %
Eosinophils Absolute: 0.1 10*3/uL (ref 0.0–0.5)
Eosinophils Relative: 1 %
HEMATOCRIT: 27.3 % — AB (ref 34.8–46.6)
HEMOGLOBIN: 8.7 g/dL — AB (ref 11.6–15.9)
Lymphocytes Relative: 9 %
Lymphs Abs: 0.4 10*3/uL — ABNORMAL LOW (ref 0.9–3.3)
MCH: 28 pg (ref 25.1–34.0)
MCHC: 31.7 g/dL (ref 31.5–36.0)
MCV: 88.4 fL (ref 79.5–101.0)
MONOS PCT: 11 %
Monocytes Absolute: 0.5 10*3/uL (ref 0.1–0.9)
NEUTROS ABS: 3.5 10*3/uL (ref 1.5–6.5)
NEUTROS PCT: 78 %
Platelet Count: 95 10*3/uL — ABNORMAL LOW (ref 145–400)
RBC: 3.09 MIL/uL — ABNORMAL LOW (ref 3.70–5.45)
RDW: 24.5 % — ABNORMAL HIGH (ref 11.2–14.5)
WBC Count: 4.4 10*3/uL (ref 3.9–10.3)

## 2018-01-27 LAB — CMP (CANCER CENTER ONLY)
ALK PHOS: 405 U/L — AB (ref 40–150)
ALT: 37 U/L (ref 0–55)
ANION GAP: 7 (ref 3–11)
AST: 69 U/L — ABNORMAL HIGH (ref 5–34)
Albumin: 2.7 g/dL — ABNORMAL LOW (ref 3.5–5.0)
BUN: 10 mg/dL (ref 7–26)
CALCIUM: 9.1 mg/dL (ref 8.4–10.4)
CHLORIDE: 105 mmol/L (ref 98–109)
CO2: 27 mmol/L (ref 22–29)
Creatinine: 0.73 mg/dL (ref 0.60–1.10)
GFR, Estimated: >60 mL/min (ref >60)
Glucose, Bld: 120 mg/dL (ref 70–140)
POTASSIUM: 3.5 mmol/L (ref 3.5–5.1)
Sodium: 139 mmol/L (ref 136–145)
Total Bilirubin: 1.9 mg/dL — ABNORMAL HIGH (ref 0.2–1.2)
Total Protein: 7.6 g/dL (ref 6.4–8.3)

## 2018-01-27 MED ORDER — SODIUM CHLORIDE 0.9 % IV SOLN
800.0000 mg/m2 | Freq: Once | INTRAVENOUS | Status: AC
  Administered 2018-01-27: 1254 mg via INTRAVENOUS
  Filled 2018-01-27: qty 32.98

## 2018-01-27 MED ORDER — SODIUM CHLORIDE 0.9% FLUSH
10.0000 mL | Freq: Once | INTRAVENOUS | Status: AC
  Administered 2018-01-27: 10 mL
  Filled 2018-01-27: qty 10

## 2018-01-27 MED ORDER — AMOXICILLIN-POT CLAVULANATE 500-125 MG PO TABS: 500.0000 mg | ORAL_TABLET | Freq: Two times a day (BID) | ORAL | 2 refills | Status: DC

## 2018-01-27 MED ORDER — SODIUM CHLORIDE 0.9% FLUSH
10.0000 mL | INTRAVENOUS | Status: DC | PRN
  Administered 2018-01-27: 10 mL
  Filled 2018-01-27: qty 10

## 2018-01-27 MED ORDER — PACLITAXEL PROTEIN-BOUND CHEMO INJECTION 100 MG
100.0000 mg/m2 | Freq: Once | Status: AC
  Administered 2018-01-27: 150 mg via INTRAVENOUS
  Filled 2018-01-27: qty 30

## 2018-01-27 MED ORDER — SODIUM CHLORIDE 0.9 % IV SOLN
Freq: Once | INTRAVENOUS | Status: AC
  Administered 2018-01-27: 11:00:00 via INTRAVENOUS

## 2018-01-27 MED ORDER — PROCHLORPERAZINE MALEATE 10 MG PO TABS
ORAL_TABLET | ORAL | Status: AC
  Filled 2018-01-27: qty 1

## 2018-01-27 MED ORDER — PROCHLORPERAZINE MALEATE 10 MG PO TABS
10.0000 mg | ORAL_TABLET | Freq: Once | ORAL | Status: AC
  Administered 2018-01-27: 10 mg via ORAL

## 2018-01-27 MED ORDER — HEPARIN SOD (PORK) LOCK FLUSH 100 UNIT/ML IV SOLN
500.0000 [IU] | Freq: Once | INTRAVENOUS | Status: AC | PRN
  Administered 2018-01-27: 500 [IU]
  Filled 2018-01-27: qty 5

## 2018-01-27 MED FILL — AMOX-CLAV 500-125 MG TABLET: 500-125 | 30 days supply | Qty: 60 | Fill #0

## 2018-01-27 NOTE — Patient Instructions (Signed)
[facility] Discharge Instructions for Patients Receiving Chemotherapy  Today you received the following chemotherapy agents: Abraxane and Gemzar  To help prevent nausea and vomiting after your treatment, we encourage you to take your nausea medication as prescribed.    If you develop nausea and vomiting that is not controlled by your nausea medication, call the clinic.   BELOW ARE SYMPTOMS THAT SHOULD BE REPORTED IMMEDIATELY:  *FEVER GREATER THAN 100.5 F  *CHILLS WITH OR WITHOUT FEVER  NAUSEA AND VOMITING THAT IS NOT CONTROLLED WITH YOUR NAUSEA MEDICATION  *UNUSUAL SHORTNESS OF BREATH  *UNUSUAL BRUISING OR BLEEDING  TENDERNESS IN MOUTH AND THROAT WITH OR WITHOUT PRESENCE OF ULCERS  *URINARY PROBLEMS  *BOWEL PROBLEMS  UNUSUAL RASH Items with * indicate a potential emergency and should be followed up as soon as possible.  Feel free to call the clinic should you have any questions or concerns. The clinic phone number is [phone].  Please show the CHEMO ALERT CARD at check-in to the Emergency Department and triage nurse.   

## 2018-01-27 NOTE — Progress Notes (Signed)
Nutrition follow-up completed with patient during infusion for metastatic pancreas cancer. Weight documented as 117 pounds decreased from 127.7 pounds May 29. Patient was status post paracentesis on May 30 removing 2.4 L of fluid. Patient reports she does not have any mouth sores. Diarrhea has resolved. Reports she continues to have some abdominal pain and distention. Appetite has improved. She drinks 1 regular strawberry boost daily.  Nutrition diagnosis: Inadequate oral intake improved.  Intervention: Educated patient to continue strategies for small frequent meals and snacks with adequate calories and protein. Recommended patient increase regular boost to boost plus. Encourage patient to try to drink 2-3 daily between meals. Teach back method was used.  Monitoring, evaluation, goals: Patient will tolerate adequate calories and protein to minimize loss of lean body mass.  Next visit: Wednesday, July 10 during infusion.  **Disclaimer: This note was dictated with voice recognition software. Similar sounding words can inadvertently be transcribed and this note may contain transcription errors which may not have been corrected upon publication of note.**

## 2018-01-27 NOTE — Progress Notes (Signed)
Okay to treat today per [name], NP, per Dr. Benay Spice. To obtain blood cultures prior to treatment.

## 2018-01-27 NOTE — Progress Notes (Signed)
01/27/18 @ 1050  Proceed with treatment with platelets of 95K.  T.O. [name] NP/Bram Hottel Ronnald Ramp, PharmD

## 2018-01-27 NOTE — Telephone Encounter (Signed)
Appointments scheduled patient declined AVS/Calendar per 6/12 los

## 2018-01-27 NOTE — Progress Notes (Signed)
[place] OFFICE PROGRESS NOTE   Diagnosis:  Pancreas cancer  INTERVAL HISTORY:   Crystal Carlson returns as scheduled.  She completed a cycle of gemcitabine 01/13/2018.  She denies nausea/vomiting.  No mouth sores.  No diarrhea.  She has persistent numbness in the hands and feet.  Hands may be slightly better.  She has intermittent low abdominal pain and is taking Dilaudid 1-2 times a day.  Abdomen continues to be distended.  She had a fever of 101.4 two days ago and 101.7 yesterday.  She feels cold prior to the fever.  She still has cough and shortness of breath.  No urinary symptoms.  Her husband notes that overall she seems better.  She is much more active over the past several weeks.  Objective:  Vital signs in last 24 hours:  Blood pressure (!) 141/94, pulse 74, temperature 98.5 F (36.9 C), temperature source Oral, resp. rate 18, height 5\' 5"  (1.651 m), weight 117 lb 1.6 oz (53.1 kg), SpO2 100 %.    HEENT: No thrush or ulcers. Resp: Lungs clear bilaterally. Cardio: Regular rate and rhythm. GI: Abdomen is soft, distended consistent with ascites.  No hepatomegaly. Vascular: No leg edema. Neuro: Alert and oriented. Skin: No rash. Port-A-Cath without erythema.   Lab Results:  Lab Results  Component Value Date   WBC 4.4 01/27/2018   HGB 8.7 (L) 01/27/2018   HCT 27.3 (L) 01/27/2018   MCV 88.4 01/27/2018   PLT 95 (L) 01/27/2018   NEUTROABS 3.5 01/27/2018    Imaging:  No results found.  Medications: I have reviewed the patient's current medications.  Assessment/Plan: 1. 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 Adenocarcinoma consistent with pancreatobiliary primary.  Cycle 1 FOLFIRINOX 06/18/2017  Cycle 2FOLFIRINOX 07/02/2017  Cycle 3 FOLFIRINOX 07/16/2017 (oxaliplatin dose reduced due to mild thrombocytopenia)  CT 07/24/2017-new splenomegaly and left hydronephrosis  Cycle 4 FOLFIRINOX 08/08/2017  Cycle 5 FOLFIRINOX 08/24/2017  CT abdomen/pelvis 09/03/2017-stable liver lesions and pancreas mass, stable right biliary dilatation, possible new 8 mm right liver lesion versus a vascular phenomena, lesion at the right paracolic gutter was present in the past-significance unclear  Cycle 6 FOLFIRINOX1/29/2019  Cycle7 FOLFIRINOX2/19/2019 (Irinotecan andOxaliplatin dose reduced due to thrombocytopenia)  CT abdomen/pelvis 10/25/2017- findings concerning for enterocolitis. No bowel obstruction. Ill-defined pancreatic head mass. Multiple hepatic hypoenhancing lesions with the largest measuring 1.9 x 2.0 cm, previously 1.7 x 1.7 cm; 9 x 10 mm hypoenhancing lesion in the inferior right lobe of the liver slightly larger compared to the previous CT; 1.6 x 2.4 cm enhancing lesion in the right paracolic gutter increased in size compared to the prior CT.  Cycle 1 gemcitabine/Abraxane 11/04/2017  Cycle 2 gemcitabine/Abraxane 11/25/2017  Cycle 3 gemcitabine/Abraxane 12/08/2017  Cycle 4 gemcitabine 12/23/2017 (Abraxane held secondary to neuropathy)  CTs abdomen/pelvis 01/12/2018- decrease in size of the pancreatic mass and hepatic metastasis; slight interval decrease in size of right paracolic gutter implant; worsening abdominal/pelvic ascites.  Bowel wall thickening likely due to ascites and low albumin.  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 Pain secondary to  #1.Improved.  3. Weight losssecondary to #1.  4. Microcytic anemia  Transfused with packed red blood cells 07/25/2017, 16 2019, and 11/27/2017  5. History of thrombocytopenia secondary to chemotherapy  6. Port-A-Cath placement 06/04/2017  7. Elevated liver enzymes/bilirubin10/22/2018-likely early biliary obstruction secondary to pancreas cancer, status post ERCP-placement of a common bile duct stent 06/09/2018  8.Postprandial abdominal pain. Protonix initiated 07/16/2017.Trial of Reglan initiated 08/24/2017.  9.Admission with fever 07/24/2017-no source for infection identified, resolved   10.Diarrheafollowing cycle 3 FOLFIRINOX-resolved  11.Left hydronephrosis noted on CT12/03/2017-etiology unclear, urology consulted-stent not recommended.Mild left hydronephrosis noted on CT 10/25/2017. Creatinine normal 10/27/2017.  12. Admission 08/31/2017 with a high fever, blood culture positive for gram-negative rods-Klebsiella pneumoniae identified  13.Fever 10/25/2017 status post evaluation in the emergency department. CT with findings concerning for enterocolitis.   14. Admission 11/11/2017 with a high fever-blood cultures (peripheral and Port-A-Cath) positive for gram-negative rods--STENOTROPHOMONAS MALTOPHILIA;course of Septra completed as an outpatient.  15. Rash-potentially secondary to gemcitabine or infection, improved  16. Pancytopenia secondary to chemotherapy  17.Intermittent fever. Question "tumor fever", question infection. Now on Bactrim prophylaxis, improved  18.  Ascites.  Paracentesis 01/14/2018- reactive mesothelial cells, mixed inflammation; culture positive for Klebsiella and enterococcus treated with Augmentin   Disposition: Crystal Carlson appears stable.  She has completed 5 cycles of systemic therapy.  First 3 cycles included gemcitabine/Abraxane, last 2 cycles gemcitabine only due to neuropathy.  Most recent CA-19-9 further  improved.  Plan to proceed with cycle 6 today as scheduled with gemcitabine and Abraxane.  Culture from the paracentesis 01/14/2018 returned positive for Klebsiella and enterococcus.  She is currently on Augmentin.  She has had 2 fevers over the past 48 hours.  She is not ill-appearing.  We are obtaining a blood culture from the Port-A-Cath today.  Dr. Benay Spice will discuss the indication for IV antibiotics with infectious disease.  We reviewed the labs from today.  The bilirubin is elevated.  Plan to continue to monitor.  She will return for lab, follow-up and cycle 7 gemcitabine/Abraxane in 2 weeks.  She will contact the office in the interim with any problems.  We specifically discussed fever, chills, other signs of infection.  Patient seen with Dr. Benay Spice.  25 minutes were spent face-to-face at today's visit with the majority of that time involved in counseling/coordination of care.    Crystal Carlson ANP/GNP-BC   01/27/2018  9:25 AM This was a shared visit with Crystal Carlson.  Crystal Carlson was interviewed and examined.  Her performance status continues to improve.  The plan is to continue gemcitabine/Abraxane chemotherapy. She is taking Augmentin for the recent diagnosis of peritonitis.  It is unclear whether the recent fever is related to peritonitis.  The plan is to continue Augmentin.  We will check a blood culture from the Port-A-Cath.  We will repeat a peritoneal fluid cell count and culture with the next paracentesis.  She will call us for a persistent fever.  I discussed the case with the infectious disease service.  Crystal Manson, MD

## 2018-01-28 LAB — CANCER ANTIGEN 19-9: CAN 19-9: 1351 U/mL — AB (ref 0–35)

## 2018-01-30 ENCOUNTER — Other Ambulatory Visit: Payer: Self-pay

## 2018-01-30 ENCOUNTER — Encounter (HOSPITAL_COMMUNITY): Payer: Self-pay | Admitting: *Deleted

## 2018-01-30 ENCOUNTER — Inpatient Hospital Stay (HOSPITAL_COMMUNITY)
Admission: EM | Admit: 2018-01-30 | Discharge: 2018-02-02 | DRG: 371 | Disposition: A | Discharge status: Home / Self Care | Payer: Medicaid Other | Attending: Family Medicine | Admitting: Family Medicine

## 2018-01-30 DIAGNOSIS — Z79891 Long term (current) use of opiate analgesic: Secondary | ICD-10-CM

## 2018-01-30 DIAGNOSIS — B961 Klebsiella pneumoniae [K. pneumoniae] as the cause of diseases classified elsewhere: Secondary | ICD-10-CM | POA: Diagnosis present

## 2018-01-30 DIAGNOSIS — D61818 Other pancytopenia: Secondary | ICD-10-CM | POA: Diagnosis not present

## 2018-01-30 DIAGNOSIS — T451X5A Adverse effect of antineoplastic and immunosuppressive drugs, initial encounter: Secondary | ICD-10-CM | POA: Diagnosis present

## 2018-01-30 DIAGNOSIS — Z833 Family history of diabetes mellitus: Secondary | ICD-10-CM

## 2018-01-30 DIAGNOSIS — C787 Secondary malignant neoplasm of liver and intrahepatic bile duct: Secondary | ICD-10-CM | POA: Diagnosis present

## 2018-01-30 DIAGNOSIS — Z8249 Family history of ischemic heart disease and other diseases of the circulatory system: Secondary | ICD-10-CM | POA: Diagnosis not present

## 2018-01-30 DIAGNOSIS — C259 Malignant neoplasm of pancreas, unspecified: Secondary | ICD-10-CM | POA: Diagnosis present

## 2018-01-30 DIAGNOSIS — K652 Spontaneous bacterial peritonitis: Principal | ICD-10-CM | POA: Diagnosis present

## 2018-01-30 DIAGNOSIS — G893 Neoplasm related pain (acute) (chronic): Secondary | ICD-10-CM

## 2018-01-30 DIAGNOSIS — Z8261 Family history of arthritis: Secondary | ICD-10-CM | POA: Diagnosis not present

## 2018-01-30 DIAGNOSIS — B952 Enterococcus as the cause of diseases classified elsewhere: Secondary | ICD-10-CM | POA: Diagnosis present

## 2018-01-30 DIAGNOSIS — K659 Peritonitis, unspecified: Secondary | ICD-10-CM

## 2018-01-30 DIAGNOSIS — D61810 Antineoplastic chemotherapy induced pancytopenia: Secondary | ICD-10-CM | POA: Diagnosis present

## 2018-01-30 DIAGNOSIS — R188 Other ascites: Secondary | ICD-10-CM | POA: Diagnosis present

## 2018-01-30 LAB — URINALYSIS, ROUTINE W REFLEX MICROSCOPIC
BACTERIA UA: NONE SEEN
BILIRUBIN URINE: NEGATIVE
Glucose, UA: NEGATIVE mg/dL
HGB URINE DIPSTICK: NEGATIVE
KETONES UR: NEGATIVE mg/dL
NITRITE: NEGATIVE
Protein, ur: NEGATIVE mg/dL
Specific Gravity, Urine: 1.017 (ref 1.005–1.030)
pH: 6 (ref 5.0–8.0)

## 2018-01-30 LAB — CBC WITH DIFFERENTIAL/PLATELET
BASOS PCT: 0 %
Basophils Absolute: 0 10*3/uL (ref 0.0–0.1)
Eosinophils Absolute: 0 10*3/uL (ref 0.0–0.7)
Eosinophils Relative: 1 %
HCT: 25.1 % — ABNORMAL LOW (ref 36.0–46.0)
Hemoglobin: 7.8 g/dL — ABNORMAL LOW (ref 12.0–15.0)
LYMPHS ABS: 0.2 10*3/uL — AB (ref 0.7–4.0)
Lymphocytes Relative: 4 %
MCH: 27.7 pg (ref 26.0–34.0)
MCHC: 31.1 g/dL (ref 30.0–36.0)
MCV: 89 fL (ref 78.0–100.0)
MONO ABS: 0.1 10*3/uL (ref 0.1–1.0)
Monocytes Relative: 2 %
NEUTROS ABS: 3.6 10*3/uL (ref 1.7–7.7)
Neutrophils Relative %: 93 %
PLATELETS: 85 10*3/uL — AB (ref 150–400)
RBC: 2.82 MIL/uL — ABNORMAL LOW (ref 3.87–5.11)
RDW: 21.9 % — AB (ref 11.5–15.5)
WBC: 3.9 10*3/uL — ABNORMAL LOW (ref 4.0–10.5)

## 2018-01-30 LAB — I-STAT CG4 LACTIC ACID, ED: LACTIC ACID, VENOUS: 1.06 mmol/L (ref 0.5–1.9)

## 2018-01-30 LAB — COMPREHENSIVE METABOLIC PANEL
ALBUMIN: 2.4 g/dL — AB (ref 3.5–5.0)
ALK PHOS: 289 U/L — AB (ref 38–126)
ALT: 59 U/L — ABNORMAL HIGH (ref 14–54)
AST: 137 U/L — AB (ref 15–41)
Anion gap: 7 (ref 5–15)
BUN: 15 mg/dL (ref 6–20)
CALCIUM: 8.4 mg/dL — AB (ref 8.9–10.3)
CO2: 25 mmol/L (ref 22–32)
Chloride: 106 mmol/L (ref 101–111)
Creatinine, Ser: 0.61 mg/dL (ref 0.44–1.00)
GFR calc Af Amer: >60 mL/min (ref >60)
GLUCOSE: 116 mg/dL — AB (ref 65–99)
Potassium: 3.9 mmol/L (ref 3.5–5.1)
Sodium: 138 mmol/L (ref 135–145)
TOTAL PROTEIN: 6.9 g/dL (ref 6.5–8.1)
Total Bilirubin: 1.9 mg/dL — ABNORMAL HIGH (ref 0.3–1.2)

## 2018-01-30 LAB — PROTIME-INR
INR: 1.24
PROTHROMBIN TIME: 15.5 s — AB (ref 11.4–15.2)

## 2018-01-30 LAB — I-STAT BETA HCG BLOOD, ED (MC, WL, AP ONLY)

## 2018-01-30 MED ORDER — PIPERACILLIN-TAZOBACTAM 3.375 G IVPB 30 MIN
3.3750 g | Freq: Once | INTRAVENOUS | Status: AC
  Administered 2018-01-30: 3.375 g via INTRAVENOUS
  Filled 2018-01-30: qty 50

## 2018-01-30 MED ORDER — HYDROMORPHONE HCL 2 MG/ML IJ SOLN
2.0000 mg | INTRAMUSCULAR | Status: DC | PRN
  Administered 2018-01-30: 2 mg via INTRAVENOUS
  Filled 2018-01-30 (×2): qty 1

## 2018-01-30 MED ORDER — HYDROMORPHONE HCL 2 MG PO TABS
4.0000 mg | ORAL_TABLET | Freq: Once | ORAL | Status: AC
  Administered 2018-01-30: 4 mg via ORAL
  Filled 2018-01-30: qty 2

## 2018-01-30 MED ORDER — PIPERACILLIN-TAZOBACTAM 3.375 G IVPB
3.3750 g | Freq: Three times a day (TID) | INTRAVENOUS | Status: DC
  Administered 2018-01-30 – 2018-02-02 (×8): 3.375 g via INTRAVENOUS
  Filled 2018-01-30 (×10): qty 50

## 2018-01-30 MED ORDER — IBUPROFEN 200 MG PO TABS
600.0000 mg | ORAL_TABLET | Freq: Once | ORAL | Status: AC
  Administered 2018-01-30: 600 mg via ORAL
  Filled 2018-01-30: qty 3

## 2018-01-30 MED ORDER — FERROUS SULFATE 325 (65 FE) MG PO TABS
325.0000 mg | ORAL_TABLET | Freq: Three times a day (TID) | ORAL | Status: DC
  Administered 2018-01-31 – 2018-02-02 (×8): 325 mg via ORAL
  Filled 2018-01-30 (×8): qty 1

## 2018-01-30 MED ORDER — PREDNISOLONE 5 MG PO TABS
10.0000 mg | ORAL_TABLET | Freq: Every day | ORAL | Status: DC
  Administered 2018-01-31 – 2018-02-02 (×2): 10 mg via ORAL
  Filled 2018-01-30 (×2): qty 2

## 2018-01-30 MED ORDER — MORPHINE SULFATE ER 15 MG PO TBCR
15.0000 mg | EXTENDED_RELEASE_TABLET | Freq: Two times a day (BID) | ORAL | Status: DC
  Administered 2018-01-30 – 2018-02-02 (×6): 15 mg via ORAL
  Filled 2018-01-30 (×6): qty 1

## 2018-01-30 MED ORDER — ENOXAPARIN SODIUM 40 MG/0.4ML ~~LOC~~ SOLN
40.0000 mg | SUBCUTANEOUS | Status: DC
  Administered 2018-01-30: 40 mg via SUBCUTANEOUS
  Filled 2018-01-30: qty 0.4

## 2018-01-30 MED ORDER — LORAZEPAM 0.5 MG PO TABS
0.5000 mg | ORAL_TABLET | Freq: Three times a day (TID) | ORAL | Status: DC | PRN
  Administered 2018-01-31: 0.5 mg via ORAL
  Filled 2018-01-30: qty 1

## 2018-01-30 NOTE — ED Notes (Signed)
ED Provider at bedside. 

## 2018-01-30 NOTE — ED Notes (Signed)
ED TO INPATIENT HANDOFF REPORT  Name/Age/Gender Crystal Carlson 52 y.o. female  Code Status    Code Status Orders  (From admission, onward)        Start     Ordered   01/30/18 1740  Full code  Continuous     01/30/18 1739    Code Status History    Date Active Date Inactive Code Status Order ID Comments User Context   11/11/2017 1909 11/14/2017 1634 Full Code [id number]  Toy Baker, MD Inpatient   08/31/2017 2025 09/04/2017 1639 Full Code [id number]  Norval Morton, MD Inpatient   07/24/2017 1743 07/27/2017 1722 Full Code [id number]  Kinnie Feil, MD Inpatient   05/25/2017 0650 05/27/2017 2034 Full Code [id number]  Radene Gunning, NP ED      Home/SNF/Other Home  Chief Complaint chemo pt/fever  Level of Care/Admitting Diagnosis ED Disposition    ED Disposition Condition [place] [facility] Area: [facility] [[id number]]  Level of Care: Med-Surg [16]  Diagnosis: Ascites [[id number]]  Admitting Physician: Georgette Shell [[id number]]  Attending Physician: Georgette Shell [[id number]]  Estimated length of stay: 3 - 4 days  Certification:: I certify this patient will need inpatient services for at least 2 midnights  PT Class (Do Not Modify): Inpatient [101]  PT Acc Code (Do Not Modify): Private [1]       Medical History Past Medical History:  Diagnosis Date  . Cancer of pancreas ([place]) 05/26/2017  . Chronic pancreatitis ([place]) 05/19/2017  . Microcytic anemia 05/19/2017  . Prediabetes 2016   when living in [place] No Known Allergies  IV Location/Drains/Wounds Patient Lines/Drains/Airways Status   Active Line/Drains/Airways    Name:   Placement date:   Placement time:   Site:   Days:   Implanted Port Right Chest   -    -    Chest      Peripheral IV 01/30/18 Right;Upper Arm   01/30/18    1357    Arm   less than 1          Labs/Imaging Results for orders placed or performed during the hospital encounter of 01/30/18  (from the past 48 hour(s))  Comprehensive metabolic panel     Status: Abnormal   Collection Time: 01/30/18  2:00 PM  Result Value Ref Range   Sodium 138 135 - 145 mmol/L   Potassium 3.9 3.5 - 5.1 mmol/L   Chloride 106 101 - 111 mmol/L   CO2 25 22 - 32 mmol/L   Glucose, Bld 116 (H) 65 - 99 mg/dL   BUN 15 6 - 20 mg/dL   Creatinine, Ser 0.61 0.44 - 1.00 mg/dL   Calcium 8.4 (L) 8.9 - 10.3 mg/dL   Total Protein 6.9 6.5 - 8.1 g/dL   Albumin 2.4 (L) 3.5 - 5.0 g/dL   AST 137 (H) 15 - 41 U/L   ALT 59 (H) 14 - 54 U/L   Alkaline Phosphatase 289 (H) 38 - 126 U/L   Total Bilirubin 1.9 (H) 0.3 - 1.2 mg/dL   GFR calc non Af Amer >60 >60 mL/min   GFR calc Af Amer >60 >60 mL/min    Comment: (NOTE) The eGFR has been calculated using the CKD EPI equation. This calculation has not been validated in all clinical situations. eGFR's persistently <60 mL/min signify possible Chronic Kidney Disease.    Anion gap 7 5 - 15    Comment: Performed at [facility], [place] [address], [place], [place] [address]  CBC with Differential     Status: Abnormal   Collection Time: 01/30/18  2:00 PM  Result Value Ref Range   WBC 3.9 (L) 4.0 - 10.5 K/uL   RBC 2.82 (L) 3.87 - 5.11 MIL/uL   Hemoglobin 7.8 (L) 12.0 - 15.0 g/dL   HCT 25.1 (L) 36.0 - 46.0 %   MCV 89.0 78.0 - 100.0 fL   MCH 27.7 26.0 - 34.0 pg   MCHC 31.1 30.0 - 36.0 g/dL   RDW 21.9 (H) 11.5 - 15.5 %   Platelets 85 (L) 150 - 400 K/uL    Comment: RESULT REPEATED AND VERIFIED SPECIMEN CHECKED FOR CLOTS CONSISTENT WITH PREVIOUS RESULT    Neutrophils Relative % 93 %   Lymphocytes Relative 4 %   Monocytes Relative 2 %   Eosinophils Relative 1 %   Basophils Relative 0 %   Neutro Abs 3.6 1.7 - 7.7 K/uL   Lymphs Abs 0.2 (L) 0.7 - 4.0 K/uL   Monocytes Absolute 0.1 0.1 - 1.0 K/uL   Eosinophils Absolute 0.0 0.0 - 0.7 K/uL   Basophils Absolute 0.0 0.0 - 0.1 K/uL   Smear Review MORPHOLOGY UNREMARKABLE     Comment: Performed at [facility], [place] [address] [place], [place] [address]  Protime-INR     Status: Abnormal   Collection Time: 01/30/18  2:00 PM  Result Value Ref Range   Prothrombin Time 15.5 (H) 11.4 - 15.2 seconds   INR 1.24     Comment: Performed at [facility], [place] [address], [place], [place] [address]  I-Stat beta hCG blood, ED     Status: None   Collection Time: 01/30/18  2:03 PM  Result Value Ref Range   I-stat hCG, quantitative <5.0 <5 mIU/mL   Comment 3            Comment:   GEST. AGE      CONC.  (mIU/mL)   <=1 WEEK        5 - 50     2 WEEKS       50 - 500     3 WEEKS       100 - 10,000     4 WEEKS     1,000 - 30,000        FEMALE AND NON-PREGNANT FEMALE:     LESS THAN 5 mIU/mL   I-Stat CG4 Lactic Acid, ED     Status: None   Collection Time: 01/30/18  2:04 PM  Result Value Ref Range   Lactic Acid, Venous 1.06 0.5 - 1.9 mmol/L  Urinalysis, Routine w reflex microscopic     Status: Abnormal   Collection Time: 01/30/18  3:43 PM  Result Value Ref Range   Color, Urine AMBER (A) YELLOW    Comment: BIOCHEMICALS MAY BE AFFECTED BY COLOR   APPearance CLEAR CLEAR   Specific Gravity, Urine 1.017 1.005 - 1.030   pH 6.0 5.0 - 8.0   Glucose, UA NEGATIVE NEGATIVE mg/dL   Hgb urine dipstick NEGATIVE NEGATIVE   Bilirubin Urine NEGATIVE NEGATIVE   Ketones, ur NEGATIVE NEGATIVE mg/dL   Protein, ur NEGATIVE NEGATIVE mg/dL   Nitrite NEGATIVE NEGATIVE   Leukocytes, UA TRACE (A) NEGATIVE   RBC / HPF 6-10 0 - 5 RBC/hpf   WBC, UA 6-10 0 - 5 WBC/hpf   Bacteria, UA NONE SEEN NONE SEEN   Squamous Epithelial / LPF 0-5 0 - 5  Mucus PRESENT     Comment: Performed at [facility], [place] [address] [place], [place] [address]   No results found.  Pending Labs Unresulted Labs (From admission, onward)   Start     Ordered   01/31/18 0500  CBC  Tomorrow morning,   R     01/30/18 1754   01/31/18 0500  Comprehensive metabolic panel  Tomorrow morning,   R      01/30/18 1754   01/30/18 1246  Culture, blood (Routine x 2)  BLOOD CULTURE X 2,   STAT     01/30/18 1246      Vitals/Pain Today's Vitals   01/30/18 1553 01/30/18 1645 01/30/18 1745 01/30/18 1815  BP:  108/72 106/81 115/78  Pulse:  97 87 88  Resp:  _0 Temp:      TempSrc:      SpO2:  98% 95% 96%  PainSc: 2        Isolation Precautions No active isolations  Medications Medications  enoxaparin (LOVENOX) injection 40 mg (has no administration in time range)  ferrous sulfate EC tablet 325 mg (has no administration in time range)  morphine (MS CONTIN) 12 hr tablet 15 mg (has no administration in time range)  LORazepam (ATIVAN) tablet 0.5 mg (has no administration in time range)  prednisoLONE tablet 10 mg (has no administration in time range)  HYDROmorphone (DILAUDID) injection 2 mg (has no administration in time range)  piperacillin-tazobactam (ZOSYN) IVPB 3.375 g (has no administration in time range)  HYDROmorphone (DILAUDID) tablet 4 mg (4 mg Oral Given 01/30/18 1416)  piperacillin-tazobactam (ZOSYN) IVPB 3.375 g (0 g Intravenous Stopped 01/30/18 1751)    Mobility walks

## 2018-01-30 NOTE — H&P (Signed)
History and Physical    Crystal Carlson TDD:[id number] DOB: [birth year] DOA: 01/30/2018  PCP: Mack Hook, MD Patient coming from: Home    Chief Complaint: Abdominal pain  HPI: Crystal Carlson is a 52 y.o. female with medical history significant of 4 pancreatic cancer metastasized to the liver had paracentesis done 01/14/2018 came positive for Klebsiella and enterococcus was being treated as an outpatient with Augmentin under the care of Dr. Benay Spice.  Patient continued to have abdominal pain abdominal distention and fever of 102 at home.  So she decided to come to the ER.  She denies any nausea vomiting diarrhea or constipation.  She denies any chest pain shortness of breath or cough.  She denies any headaches.  She lives at home with her family. ED Course: Patient received Zosyn and Dilaudid in the ER.  Showed a sodium of 138 potassium 3.9 BUN 15 creatinine 0.61 albumin was 2.4 total bilirubin was 1.9 lactic acid level was 1.06 WBC count 3.9 hemoglobin 7.8 hematocrit 25.1 platelet count 85.  Cultures have been done in the ER.  Review of Systems: [place] for abdominal distention increasing abdominal pain fever and chills at home with a temp of 102.   Past Medical History:  Diagnosis Date  . Cancer of pancreas ([place]) 05/26/2017  . Chronic pancreatitis ([place]) 05/19/2017  . Microcytic anemia 05/19/2017  . Prediabetes 2016   when living in [place]    Past Surgical History:  Procedure Laterality Date  . ERCP N/A 06/09/2017   Procedure: ENDOSCOPIC RETROGRADE CHOLANGIOPANCREATOGRAPHY (ERCP);  Surgeon: Clarene Essex, MD;  Location: [facility];  Service: Endoscopy;  Laterality: N/A;  . IR FLUORO GUIDE PORT INSERTION RIGHT  06/04/2017  . IR US GUIDE VASC ACCESS RIGHT  06/04/2017     reports that she has never smoked. She has never used smokeless tobacco. She reports that she does not drink alcohol or use drugs.  No Known Allergies  Family History  Problem Relation Age of Onset  . Hypertension  Mother   . Arthritis Mother   . Diabetes Father   . Heart disease Father        ?valvular problems  . Hyperlipidemia Son   . Diabetes Brother     Medication Sig Start Date End Date Taking? Authorizing Provider  amoxicillin-clavulanate (AUGMENTIN) 500-125 MG tablet Take 1 tablet (500 mg total) by mouth 2 (two) times daily. 01/27/18  Yes Owens Shark, NP  ferrous sulfate 325 (65 FE) MG EC tablet Take 1 tablet (325 mg total) by mouth 3 (three) times daily with meals. 06/18/17  Yes Ladell Pier, MD  hydrocortisone cream 1 % Apply 1 application topically 3 (three) times daily as needed for itching (minor skin irritation). 07/27/17  Yes Theodis Blaze, MD  HYDROmorphone (DILAUDID) 4 MG tablet Take 1-2 tablets (4-8 mg total) by mouth every 4 (four) hours as needed for severe pain. 12/23/17  Yes Ladell Pier, MD  lidocaine-prilocaine (EMLA) cream Apply to port site one hour prior to use. Do not rub in. Cover with plastic. 11/17/17  Yes Owens Shark, NP  LORazepam (ATIVAN) 0.5 MG tablet Take 1 tablet (0.5 mg total) by mouth every 8 (eight) hours as needed for anxiety (or nausea). 12/23/17  Yes Ladell Pier, MD  morphine (MS CONTIN) 15 MG 12 hr tablet Take 1 tablet (15 mg total) by mouth every 12 (twelve) hours. 01/06/18  Yes Ladell Pier, MD  naproxen sodium (ALEVE) 220 MG tablet Take 440 mg by mouth 2 (  two) times daily as needed (fever).   Yes [provider]  prednisoLONE 5 MG TABS tablet Take 2 tablets (10 mg total) by mouth daily. 12/23/17  Yes Ladell Pier, MD  dexamethasone (DECADRON) 4 MG tablet TK 1 T PO BID FOR 3 DAYS AFTER EACH CYCLE OF CHEMO Patient not taking: Reported on 01/30/2018 09/15/17   Ladell Pier, MD  gabapentin (NEURONTIN) 100 MG capsule Take 1 capsule (100 mg total) by mouth 2 (two) times daily. Patient not taking: Reported on 01/30/2018 12/23/17   Ladell Pier, MD  hydrocortisone (ANUSOL-HC) 2.5 % rectal cream Apply 1 application topically 4 (four)  times daily as needed for hemorrhoids. Patient not taking: Reported on 01/30/2018 07/27/17   Theodis Blaze, MD  loperamide (IMODIUM) 2 MG capsule Take 1 capsule (2 mg total) by mouth as needed for diarrhea or loose stools (max 8 tabs per day). Patient not taking: Reported on 01/30/2018 08/12/17   Owens Shark, NP  metoCLOPramide (REGLAN) 5 MG tablet Take 1 tablet (5 mg total) by mouth 3 (three) times daily before meals. Patient not taking: Reported on 01/30/2018 08/24/17   Owens Shark, NP  ondansetron (ZOFRAN ODT) 4 MG disintegrating tablet Take 1 tablet (4 mg total) by mouth every 8 (eight) hours as needed for nausea or vomiting. Patient not taking: Reported on 01/30/2018 10/25/17   Delia Heady, PA-C  potassium chloride SA (K-DUR,KLOR-CON) 20 MEQ tablet Take 1 tablet (20 mEq total) by mouth daily. Patient not taking: Reported on 01/30/2018 10/27/17   Owens Shark, NP  prochlorperazine (COMPAZINE) 10 MG tablet Take 1 tablet (10 mg total) by mouth every 6 (six) hours as needed for nausea or vomiting. Patient not taking: Reported on 01/30/2018 07/07/17   Ladell Pier, MD  sulfamethoxazole-trimethoprim (BACTRIM DS,SEPTRA DS) 800-160 MG tablet Take 1 tablet by mouth daily. Patient not taking: Reported on 01/30/2018 01/13/18   Owens Shark, NP    Physical Exam: Frail young lady resting in bed in no acute distress. Vitals:   01/30/18 1339 01/30/18 1415 01/30/18 1543 01/30/18 1645  BP: (!) 140/92 (!) 136/92 130/87 108/72  Pulse: 95 91 97 97  Resp: 17 15 17 18   Temp:   98.8 F (37.1 C)   TempSrc:   Oral   SpO2: 98% 99% 100% 98%    Constitutional: NAD, calm, comfortable Vitals:   01/30/18 1339 01/30/18 1415 01/30/18 1543 01/30/18 1645  BP: (!) 140/92 (!) 136/92 130/87 108/72  Pulse: 95 91 97 97  Resp: 17 15 17 18   Temp:   98.8 F (37.1 C)   TempSrc:   Oral   SpO2: 98% 99% 100% 98%   Eyes: PERRL, lids and conjunctivae normal ENMT: Mucous membranes are moist. Posterior pharynx clear  of any exudate or lesions.Normal dentition.  Neck: normal, supple, no masses, no thyromegaly Respiratory: clear to auscultation bilaterally, no wheezing, no crackles. Normal respiratory effort. No accessory muscle use.  Cardiovascular: Regular rate and rhythm, no murmurs / rubs / gallops. No extremity edema. 2+ pedal pulses. No carotid bruits.  Abdomen: SOFT DISTENDED,DIFFUSE TENDER MILD, Bowel sounds positive.  Musculoskeletal: no clubbing / cyanosis. No joint deformity upper and lower extremities. Good ROM, no contractures. Normal muscle tone.  Skin: no rashes, lesions, ulcers. No induration Neurologic: CN 2-12 grossly intact. Sensation intact, DTR normal. Strength 5/5 in all 4.  Psychiatric: Normal judgment and insight. Alert and oriented x 3. Normal mood.   Labs on Admission: I have personally  reviewed following labs and imaging studies  CBC: Recent Labs  Lab 01/27/18 0903 01/30/18 1400  WBC 4.4 3.9*  NEUTROABS 3.5 3.6  HGB 8.7* 7.8*  HCT 27.3* 25.1*  MCV 88.4 89.0  PLT 95* 85*   Basic Metabolic Panel: Recent Labs  Lab 01/27/18 0903 01/30/18 1400  NA 139 138  K 3.5 3.9  CL 105 106  CO2 27 25  GLUCOSE 120 116*  BUN 10 15  CREATININE 0.73 0.61  CALCIUM 9.1 8.4*   GFR: Estimated Creatinine Clearance: 70.5 mL/min (by C-G formula based on SCr of 0.61 mg/dL). Liver Function Tests: Recent Labs  Lab 01/27/18 0903 01/30/18 1400  AST 69* 137*  ALT 37 59*  ALKPHOS 405* 289*  BILITOT 1.9* 1.9*  PROT 7.6 6.9  ALBUMIN 2.7* 2.4*   No results for input(s): LIPASE, AMYLASE in the last 168 hours. No results for input(s): AMMONIA in the last 168 hours. Coagulation Profile: Recent Labs  Lab 01/30/18 1400  INR 1.24   Cardiac Enzymes: No results for input(s): CKTOTAL, CKMB, CKMBINDEX, TROPONINI in the last 168 hours. BNP (last 3 results) No results for input(s): PROBNP in the last 8760 hours. HbA1C: No results for input(s): HGBA1C in the last 72 hours. CBG: No results  for input(s): GLUCAP in the last 168 hours. Lipid Profile: No results for input(s): CHOL, HDL, LDLCALC, TRIG, CHOLHDL, LDLDIRECT in the last 72 hours. Thyroid Function Tests: No results for input(s): TSH, T4TOTAL, FREET4, T3FREE, THYROIDAB in the last 72 hours. Anemia Panel: No results for input(s): VITAMINB12, FOLATE, FERRITIN, TIBC, IRON, RETICCTPCT in the last 72 hours. Urine analysis:    Component Value Date/Time   COLORURINE AMBER (A) 01/30/2018 1543   APPEARANCEUR CLEAR 01/30/2018 1543   LABSPEC 1.017 01/30/2018 1543   LABSPEC 1.020 07/24/2017 0847   PHURINE 6.0 01/30/2018 1543   GLUCOSEU NEGATIVE 01/30/2018 1543   GLUCOSEU Negative 07/24/2017 0847   HGBUR NEGATIVE 01/30/2018 1543   BILIRUBINUR NEGATIVE 01/30/2018 1543   BILIRUBINUR Negative 07/24/2017 0847   KETONESUR NEGATIVE 01/30/2018 1543   PROTEINUR NEGATIVE 01/30/2018 1543   UROBILINOGEN 0.2 07/24/2017 0847   NITRITE NEGATIVE 01/30/2018 1543   LEUKOCYTESUR TRACE (A) 01/30/2018 1543   LEUKOCYTESUR Negative 07/24/2017 0847    Radiological Exams on Admission: No results found. Assessment/Plan Active Problems:   * No active hospital problems. *   1] stage IV metastatic pancreatic cancer with mets to the liver patient admitted with abdominal pain abdominal distention and fever found to have peritonitis from a recent paracentesis done on 01/14/2018+ for Klebsiella and enterococcus failed outpatient treatment with Augmentin admitted for IV antibiotics.  Patient will be treated with Zosyn at this time.  Follow-up blood cultures that was drawn in the ER.  ED physician spoke with Dr. Julien Nordmann who was on call.  2] anemia secondary to chemotherapy  3] thrombocytopenia chronic stable  4] chronic abdominal pain restart Dilaudid and MS Contin.        DVT prophylaxis: SCD and Lovenox Code Status: Full code Family Communication: No family available Disposition Plan: TBD Consults called: ED physician spoke with Dr.  Julien Nordmann with oncology admission status: Inpatient  Georgette Shell MD [facility]  If 7PM-7AM, please contact night-coverage [email or web address] Password [facility]  01/30/2018, 5:37 PM

## 2018-01-30 NOTE — ED Notes (Signed)
Report given to Rich Fuchs on [place], [place].

## 2018-01-30 NOTE — ED Provider Notes (Signed)
[facility] DEPT Provider Note   CSN: [id number] Arrival date & time: 01/30/18  1135     History   Chief Complaint Chief Complaint  Patient presents with  . Fever    chemo pt  . Abdominal Pain    HPI Crystal Carlson is a 52 y.o. female.  52 yo F with a chief complaint of a fever.  This has been in the low 100 and seems to occur mostly at night.  The patient is undergoing chemotherapy for pancreatic cancer.  She also has had ascites and had recently been tapped 2 weeks ago and found to have Klebsiella and enterococcus.  She has been on Augmentin for the past week.  She is continued to have fevers.  There was instructions given to her by her oncologist that she needed to potentially come to the hospital for IV antibiotics.  That is why the family is here.  They do not feel that she is any worse.  Symptoms seem to be about the same.  No vomiting or diarrhea.  The history is provided by the patient.  Fever   This is a new problem. The current episode started more than 1 week ago. The problem occurs constantly. The problem has not changed since onset.Pertinent negatives include no chest pain, no vomiting, no congestion and no headaches. She has tried nothing for the symptoms. The treatment provided no relief.  Abdominal Pain   Associated symptoms include fever. Pertinent negatives include nausea, vomiting, dysuria, headaches, arthralgias and myalgias.    Past Medical History:  Diagnosis Date  . Cancer of pancreas ([place]) 05/26/2017  . Chronic pancreatitis ([place]) 05/19/2017  . Microcytic anemia 05/19/2017  . Prediabetes 2016   when living in [place]    Patient Active Problem List   Diagnosis Date Noted  . Ascites 01/30/2018  . Sepsis ([place]) 11/11/2017  . Thrombocytopenia ([place]) 11/11/2017  . SIRS (systemic inflammatory response syndrome) ([place]) 09/01/2017  . Fever of unknown origin 08/31/2017  . Antineoplastic chemotherapy induced pancytopenia ([place])  07/25/2017  . Protein-calorie malnutrition, moderate ([place]) 07/25/2017  . Dehydration 07/24/2017  . Hypokalemia 07/24/2017  . Neutropenic fever ([place]) 07/24/2017  . Port-A-Cath in place 07/02/2017  . Chronic pain due to malignant neoplastic disease 07/02/2017  . Encounter for antineoplastic chemotherapy 07/02/2017  . Goals of care, counseling/discussion 05/29/2017  . Pancreatic cancer metastasized to liver ([place]) 05/26/2017  . Abdominal pain 05/25/2017  . HTN (hypertension) 05/25/2017  . Ovarian cyst 05/25/2017  . Elevated LFTs 05/25/2017  . Hepatic metastasis ([place])   . Chronic pancreatitis ([place]) 05/19/2017  . Microcytic anemia 05/19/2017  . Prediabetes 08/18/2014    Past Surgical History:  Procedure Laterality Date  . ERCP N/A 06/09/2017   Procedure: ENDOSCOPIC RETROGRADE CHOLANGIOPANCREATOGRAPHY (ERCP);  Surgeon: Clarene Essex, MD;  Location: [facility];  Service: Endoscopy;  Laterality: N/A;  . IR FLUORO GUIDE PORT INSERTION RIGHT  06/04/2017  . IR US GUIDE VASC ACCESS RIGHT  06/04/2017     OB History   None      Home Medications    Prior to Admission medications   Medication Sig Start Date End Date Taking? Authorizing Provider  amoxicillin-clavulanate (AUGMENTIN) 500-125 MG tablet Take 1 tablet (500 mg total) by mouth 2 (two) times daily. 01/27/18  Yes Owens Shark, NP  ferrous sulfate 325 (65 FE) MG EC tablet Take 1 tablet (325 mg total) by mouth 3 (three) times daily with meals. 06/18/17  Yes Ladell Pier, MD  hydrocortisone cream  1 % Apply 1 application topically 3 (three) times daily as needed for itching (minor skin irritation). 07/27/17  Yes Theodis Blaze, MD  HYDROmorphone (DILAUDID) 4 MG tablet Take 1-2 tablets (4-8 mg total) by mouth every 4 (four) hours as needed for severe pain. 12/23/17  Yes Ladell Pier, MD  lidocaine-prilocaine (EMLA) cream Apply to port site one hour prior to use. Do not rub in. Cover with plastic. 11/17/17  Yes Owens Shark, NP    LORazepam (ATIVAN) 0.5 MG tablet Take 1 tablet (0.5 mg total) by mouth every 8 (eight) hours as needed for anxiety (or nausea). 12/23/17  Yes Ladell Pier, MD  morphine (MS CONTIN) 15 MG 12 hr tablet Take 1 tablet (15 mg total) by mouth every 12 (twelve) hours. 01/06/18  Yes Ladell Pier, MD  naproxen sodium (ALEVE) 220 MG tablet Take 440 mg by mouth 2 (two) times daily as needed (fever).   Yes [provider]  prednisoLONE 5 MG TABS tablet Take 2 tablets (10 mg total) by mouth daily. 12/23/17  Yes Ladell Pier, MD  dexamethasone (DECADRON) 4 MG tablet TK 1 T PO BID FOR 3 DAYS AFTER EACH CYCLE OF CHEMO Patient not taking: Reported on 01/30/2018 09/15/17   Ladell Pier, MD  gabapentin (NEURONTIN) 100 MG capsule Take 1 capsule (100 mg total) by mouth 2 (two) times daily. Patient not taking: Reported on 01/30/2018 12/23/17   Ladell Pier, MD  hydrocortisone (ANUSOL-HC) 2.5 % rectal cream Apply 1 application topically 4 (four) times daily as needed for hemorrhoids. Patient not taking: Reported on 01/30/2018 07/27/17   Theodis Blaze, MD  loperamide (IMODIUM) 2 MG capsule Take 1 capsule (2 mg total) by mouth as needed for diarrhea or loose stools (max 8 tabs per day). Patient not taking: Reported on 01/30/2018 08/12/17   Owens Shark, NP  metoCLOPramide (REGLAN) 5 MG tablet Take 1 tablet (5 mg total) by mouth 3 (three) times daily before meals. Patient not taking: Reported on 01/30/2018 08/24/17   Owens Shark, NP  ondansetron (ZOFRAN ODT) 4 MG disintegrating tablet Take 1 tablet (4 mg total) by mouth every 8 (eight) hours as needed for nausea or vomiting. Patient not taking: Reported on 01/30/2018 10/25/17   Delia Heady, PA-C  potassium chloride SA (K-DUR,KLOR-CON) 20 MEQ tablet Take 1 tablet (20 mEq total) by mouth daily. Patient not taking: Reported on 01/30/2018 10/27/17   Owens Shark, NP  prochlorperazine (COMPAZINE) 10 MG tablet Take 1 tablet (10 mg total) by mouth every 6  (six) hours as needed for nausea or vomiting. Patient not taking: Reported on 01/30/2018 07/07/17   Ladell Pier, MD  sulfamethoxazole-trimethoprim (BACTRIM DS,SEPTRA DS) 800-160 MG tablet Take 1 tablet by mouth daily. Patient not taking: Reported on 01/30/2018 01/13/18   Owens Shark, NP    Family History Family History  Problem Relation Age of Onset  . Hypertension Mother   . Arthritis Mother   . Diabetes Father   . Heart disease Father        ?valvular problems  . Hyperlipidemia Son   . Diabetes Brother     Social History Social History   Tobacco Use  . Smoking status: Never Smoker  . Smokeless tobacco: Never Used  Substance Use Topics  . Alcohol use: No  . Drug use: No     Allergies   Patient has no known allergies.   Review of Systems Review of Systems  Constitutional:  Positive for fever. Negative for chills.  HENT: Negative for congestion and rhinorrhea.   Eyes: Negative for redness and visual disturbance.  Respiratory: Negative for shortness of breath and wheezing.   Cardiovascular: Negative for chest pain and palpitations.  Gastrointestinal: Positive for abdominal pain. Negative for nausea and vomiting.  Genitourinary: Negative for dysuria and urgency.  Musculoskeletal: Negative for arthralgias and myalgias.  Skin: Negative for pallor and wound.  Neurological: Negative for dizziness and headaches.     Physical Exam Updated Vital Signs BP 95/64 (BP Location: Left Arm)   Pulse 75   Temp 98.1 F (36.7 C) (Oral)   Resp 14   Ht 5\' 5"  (1.651 m)   Wt 52.9 kg (116 lb 11.2 oz)   SpO2 99%   BMI 19.42 kg/m   Physical Exam  Constitutional: She is oriented to person, place, and time. She appears well-developed and well-nourished. No distress.  HENT:  Head: Normocephalic and atraumatic.  Eyes: Pupils are equal, round, and reactive to light. EOM are normal.  Neck: Normal range of motion. Neck supple.  Cardiovascular: Normal rate and regular rhythm. Exam  reveals no gallop and no friction rub.  No murmur heard. Pulmonary/Chest: Effort normal. She has no wheezes. She has no rales.  Abdominal: Soft. She exhibits no distension and no mass. There is tenderness (mild diffuse). There is no guarding.  Musculoskeletal: She exhibits no edema or tenderness.  Neurological: She is alert and oriented to person, place, and time.  Skin: Skin is warm and dry. She is not diaphoretic.  Psychiatric: She has a normal mood and affect. Her behavior is normal.  Nursing note and vitals reviewed.    ED Treatments / Results  Labs (all labs ordered are listed, but only abnormal results are displayed) Labs Reviewed  COMPREHENSIVE METABOLIC PANEL - Abnormal; Notable for the following components:      Result Value   Glucose, Bld 116 (*)    Calcium 8.4 (*)    Albumin 2.4 (*)    AST 137 (*)    ALT 59 (*)    Alkaline Phosphatase 289 (*)    Total Bilirubin 1.9 (*)    All other components within normal limits  CBC WITH DIFFERENTIAL/PLATELET - Abnormal; Notable for the following components:   WBC 3.9 (*)    RBC 2.82 (*)    Hemoglobin 7.8 (*)    HCT 25.1 (*)    RDW 21.9 (*)    Platelets 85 (*)    Lymphs Abs 0.2 (*)    All other components within normal limits  PROTIME-INR - Abnormal; Notable for the following components:   Prothrombin Time 15.5 (*)    All other components within normal limits  URINALYSIS, ROUTINE W REFLEX MICROSCOPIC - Abnormal; Notable for the following components:   Color, Urine AMBER (*)    Leukocytes, UA TRACE (*)    All other components within normal limits  CBC - Abnormal; Notable for the following components:   WBC 2.8 (*)    RBC 2.63 (*)    Hemoglobin 7.5 (*)    HCT 23.6 (*)    RDW 22.1 (*)    Platelets 103 (*)    All other components within normal limits  COMPREHENSIVE METABOLIC PANEL - Abnormal; Notable for the following components:   Calcium 8.2 (*)    Total Protein 5.9 (*)    Albumin 2.1 (*)    AST 118 (*)    Alkaline  Phosphatase 242 (*)    Total Bilirubin 2.0 (*)  All other components within normal limits  CULTURE, BLOOD (ROUTINE X 2)  CULTURE, BLOOD (ROUTINE X 2)  I-STAT CG4 LACTIC ACID, ED  I-STAT BETA HCG BLOOD, ED ([facility], [facility], [facility] ONLY)    EKG None  Radiology No results found.  Procedures Procedures (including critical care time)  Medications Ordered in ED Medications  enoxaparin (LOVENOX) injection 40 mg (40 mg Subcutaneous Given 01/30/18 2122)  ferrous sulfate tablet 325 mg (has no administration in time range)  morphine (MS CONTIN) 12 hr tablet 15 mg (15 mg Oral Given 01/30/18 2122)  LORazepam (ATIVAN) tablet 0.5 mg (has no administration in time range)  prednisoLONE tablet 10 mg (has no administration in time range)  HYDROmorphone (DILAUDID) injection 2 mg (2 mg Intravenous Given 01/30/18 2126)  piperacillin-tazobactam (ZOSYN) IVPB 3.375 g (3.375 g Intravenous New Bag/Given 01/31/18 0606)  HYDROmorphone (DILAUDID) tablet 4 mg (4 mg Oral Given 01/30/18 1416)  piperacillin-tazobactam (ZOSYN) IVPB 3.375 g (0 g Intravenous Stopped 01/30/18 1751)  ibuprofen (ADVIL,MOTRIN) tablet 600 mg (600 mg Oral Given 01/30/18 2122)     Initial Impression / Assessment and Plan / ED Course  I have reviewed the triage vital signs and the nursing notes.  Pertinent labs & imaging results that were available during my care of the patient were reviewed by me and considered in my medical decision making (see chart for details).     52 yo F with a chief complaints of fever.  Patient has had a diagnosis of enterocolitis has been treated with Augmentin.  She continues to have fevers.  Family came to the ED thinking she may need IV antibiotics.    The patient has had no clinical worsening at home.  She is continued to be just as well or unwell at home.  I will obtain labs and discuss with oncology.  Discussed with Dr. Earlie Server, as the patient is not improved with outpatient antibiotics he felt this was a failure of  outpatient therapy and recommended IV antibiotics and placement into the hospital.  He felt she needed significant gram-negative coverage.  The patients results and plan were reviewed and discussed.   Any x-rays performed were independently reviewed by myself.   Differential diagnosis were considered with the presenting HPI.  Medications  enoxaparin (LOVENOX) injection 40 mg (40 mg Subcutaneous Given 01/30/18 2122)  ferrous sulfate tablet 325 mg (has no administration in time range)  morphine (MS CONTIN) 12 hr tablet 15 mg (15 mg Oral Given 01/30/18 2122)  LORazepam (ATIVAN) tablet 0.5 mg (has no administration in time range)  prednisoLONE tablet 10 mg (has no administration in time range)  HYDROmorphone (DILAUDID) injection 2 mg (2 mg Intravenous Given 01/30/18 2126)  piperacillin-tazobactam (ZOSYN) IVPB 3.375 g (3.375 g Intravenous New Bag/Given 01/31/18 0606)  HYDROmorphone (DILAUDID) tablet 4 mg (4 mg Oral Given 01/30/18 1416)  piperacillin-tazobactam (ZOSYN) IVPB 3.375 g (0 g Intravenous Stopped 01/30/18 1751)  ibuprofen (ADVIL,MOTRIN) tablet 600 mg (600 mg Oral Given 01/30/18 2122)    Vitals:   01/30/18 1832 01/30/18 2109 01/30/18 2324 01/31/18 0530  BP: 131/79 129/81  95/64  Pulse: 89 93  75  Resp: 18 15  14   Temp: 99.2 F (37.3 C) (!) 102.1 F (38.9 C) 99.6 F (37.6 C) 98.1 F (36.7 C)  TempSrc: Oral Oral Oral Oral  SpO2: 100% 98%  99%  Weight: 52.9 kg (116 lb 11.2 oz)     Height: 5\' 5"  (1.651 m)       Final diagnoses:  Other ascites  Admission/ observation were discussed with the admitting physician, patient and/or family and they are comfortable with the plan.    Final Clinical Impressions(s) / ED Diagnoses   Final diagnoses:  Other ascites    ED Discharge Orders    None       Deno Etienne, DO 01/31/18 0708

## 2018-01-30 NOTE — Progress Notes (Signed)
Pharmacy Antibiotic Note  Crystal Carlson is a 52 y.o. female admitted on 01/30/2018 with intra-abdominal infection. PMH significant for pancreatic CA undergoing chemotherapy.  Ascitic fluid grew enterococcus & Klebsiella recently and she had been on Augmentin ~ 1 week.  Sent to hospital by oncologist for IV antibiotics, however patient reports she has not worsened on oral antibiotics.  Pharmacy has been consulted for Zosyn dosing.  01/30/2018:  Afebrile since admission, reports of fever 100F PTA mostly a night  Renal function at patient's baseline (~60-27ml/min)  Plan: Zosyn 3.375gm IV Q8h to be infused over 4hrs No dose adjustments anticipated- Pharmacy will sign off & monitor peripherally via electronic surveillance software.       Temp (24hrs), Avg:98.6 F (37 C), Min:98.3 F (36.8 C), Max:98.8 F (37.1 C)  Recent Labs  Lab 01/27/18 0903 01/30/18 1400 01/30/18 1404  WBC 4.4 3.9*  --   CREATININE 0.73 0.61  --   LATICACIDVEN  --   --  1.06    Estimated Creatinine Clearance: 70.5 mL/min (by C-G formula based on SCr of 0.61 mg/dL).    No Known Allergies  Antimicrobials this admission: 6/15 Zosyn >>   Dose adjustments this admission:  Microbiology results: 6/15 BCx: IP 5/30 Pleural fluid: Klebsiella Oxytoca R=ampicllin, Bactrim/ others sensitive, Enterococcus Faecalis (pan-sens)  Thank you for allowing pharmacy to be a part of this patient's care.  Biagio Borg 01/30/2018 5:56 PM

## 2018-01-30 NOTE — ED Triage Notes (Signed)
Pt complains of fever for the past 3 days. Pt was seen by oncologist and had blood cultures and received chemotherapy. Pt has been on augmentin for the past week for peritonitis.  Pt complains of abdominal pain, denies emesis or diarrhea.

## 2018-01-31 DIAGNOSIS — C259 Malignant neoplasm of pancreas, unspecified: Secondary | ICD-10-CM

## 2018-01-31 DIAGNOSIS — D61818 Other pancytopenia: Secondary | ICD-10-CM

## 2018-01-31 DIAGNOSIS — C787 Secondary malignant neoplasm of liver and intrahepatic bile duct: Secondary | ICD-10-CM

## 2018-01-31 LAB — CBC
HCT: 23.6 % — ABNORMAL LOW (ref 36.0–46.0)
Hemoglobin: 7.5 g/dL — ABNORMAL LOW (ref 12.0–15.0)
MCH: 28.5 pg (ref 26.0–34.0)
MCHC: 31.8 g/dL (ref 30.0–36.0)
MCV: 89.7 fL (ref 78.0–100.0)
PLATELETS: 103 10*3/uL — AB (ref 150–400)
RBC: 2.63 MIL/uL — ABNORMAL LOW (ref 3.87–5.11)
RDW: 22.1 % — ABNORMAL HIGH (ref 11.5–15.5)
WBC: 2.8 10*3/uL — ABNORMAL LOW (ref 4.0–10.5)

## 2018-01-31 LAB — COMPREHENSIVE METABOLIC PANEL
ALK PHOS: 242 U/L — AB (ref 38–126)
ALT: 54 U/L (ref 14–54)
ANION GAP: 5 (ref 5–15)
AST: 118 U/L — ABNORMAL HIGH (ref 15–41)
Albumin: 2.1 g/dL — ABNORMAL LOW (ref 3.5–5.0)
BUN: 15 mg/dL (ref 6–20)
CALCIUM: 8.2 mg/dL — AB (ref 8.9–10.3)
CHLORIDE: 107 mmol/L (ref 101–111)
CO2: 27 mmol/L (ref 22–32)
Creatinine, Ser: 0.83 mg/dL (ref 0.44–1.00)
GFR calc Af Amer: >60 mL/min (ref >60)
Glucose, Bld: 94 mg/dL (ref 65–99)
Potassium: 3.8 mmol/L (ref 3.5–5.1)
SODIUM: 139 mmol/L (ref 135–145)
Total Bilirubin: 2 mg/dL — ABNORMAL HIGH (ref 0.3–1.2)
Total Protein: 5.9 g/dL — ABNORMAL LOW (ref 6.5–8.1)

## 2018-01-31 MED ORDER — IBUPROFEN 200 MG PO TABS
600.0000 mg | ORAL_TABLET | Freq: Once | ORAL | Status: AC
  Administered 2018-01-31: 600 mg via ORAL
  Filled 2018-01-31: qty 3

## 2018-01-31 NOTE — Progress Notes (Signed)
PROGRESS NOTE [facility]   Crystal Carlson   EGB:[id number] DOB: 01-08-1966  DOA: 01/30/2018 PCP: Mack Hook, MD   Brief Narrative:  Crystal Carlson is a 52 y/o F with medical significant of pancreatic cancer metastasized to the liver.  Patient had paracentesis done on 5/30 which came positive for Klebsiella and enterococcus and was being treated as an outpatient with Augmentin, however patient continued to have abdominal pain and fever at home and decided to come to the ER for further evaluation.  Upon ED evaluation she was found to be febrile, hemoglobin is 7.8, platelet count 85 and total bilirubin of 1.9.  Cultures were obtained and patient was admitted for peritonitis failure of outpatient therapies.  Subjective: Patient seen and examined, report her abdominal pain is much better, after having a bowel movement this morning.  Denies nausea and vomiting, her belly is not more distended than when he was last paracentesis.  So far no fever this morning.  Tolerating diet well.  Assessment & Plan: Peritonitis due to Klebsiella/enterococcus faecalis Outpatient treatment with Augmentin, admittedfor IV antibiotic therapy.  Agree with Zosyn for at least 48 hours.  Follow-up blood culture.  Will discuss with ID if need to repeat paracentesis.  Continue supportive treatment. Continue to monitor fever.   Stage IV metastatic pancreatic cancer with mets to the liver Followed by Dr.Sherril as an outpatient On chemotherapy   Pancytopenia  Due to chemo No signs of bleeding, continue to monitor   Cancer related pain  Continue home dose Ms contin and Diluadid   DVT prophylaxis: SCD's, hold Lovenox due to thrombocytopenia  Code Status: Full Code  Family Communication: None at bedside  Disposition Plan: Home in 1-2 days   Consultants:   None   Procedures:   None   Antimicrobials: Anti-infectives (From admission, onward)   Start     Dose/Rate Route Frequency Ordered Stop   01/30/18  2200  piperacillin-tazobactam (ZOSYN) IVPB 3.375 g     3.375 g 12.5 mL/hr over 240 Minutes Intravenous Every 8 hours 01/30/18 1756     01/30/18 1615  piperacillin-tazobactam (ZOSYN) IVPB 3.375 g     3.375 g 100 mL/hr over 30 Minutes Intravenous  Once 01/30/18 1601 01/30/18 1751        Objective: Vitals:   01/30/18 1832 01/30/18 2109 01/30/18 2324 01/31/18 0530  BP: 131/79 129/81  95/64  Pulse: 89 93  75  Resp: 18 15  14   Temp: 99.2 F (37.3 C) (!) 102.1 F (38.9 C) 99.6 F (37.6 C) 98.1 F (36.7 C)  TempSrc: Oral Oral Oral Oral  SpO2: 100% 98%  99%  Weight: 52.9 kg (116 lb 11.2 oz)     Height: 5\' 5"  (1.651 m)       Intake/Output Summary (Last 24 hours) at 01/31/2018 1145 Last data filed at 01/31/2018 0138 Gross per 24 hour  Intake 221.87 ml  Output -  Net 221.87 ml   Filed Weights   01/30/18 1832  Weight: 52.9 kg (116 lb 11.2 oz)    Examination:  General exam: Appears calm and comfortable  HEENT: AC/AT, PERRLA, OP moist and clear Respiratory system: Clear to auscultation. No wheezes,crackle or rhonchi Cardiovascular system: S1 & S2 heard, RRR. No JVD, murmurs, rubs or gallops Gastrointestinal system: Abdomen is nondistended, soft and mild tenderness at RUQ. No organomegaly or masses felt. Normal bowel sounds heard. No fluid wave felt  Central nervous system: Alert and oriented. No focal neurological deficits. Extremities: No pedal edema.  Skin: No  rashes, lesions or ulcers Psychiatry: Mood & affect appropriate.    Data Reviewed: I have personally reviewed following labs and imaging studies  CBC: Recent Labs  Lab 01/27/18 0903 01/30/18 1400 01/31/18 0510  WBC 4.4 3.9* 2.8*  NEUTROABS 3.5 3.6  --   HGB 8.7* 7.8* 7.5*  HCT 27.3* 25.1* 23.6*  MCV 88.4 89.0 89.7  PLT 95* 85* 101*   Basic Metabolic Panel: Recent Labs  Lab 01/27/18 0903 01/30/18 1400 01/31/18 0510  NA 139 138 139  K 3.5 3.9 3.8  CL 105 106 107  CO2 27 25 27   GLUCOSE 120 116* 94    BUN 10 15 15   CREATININE 0.73 0.61 0.83  CALCIUM 9.1 8.4* 8.2*   GFR: Estimated Creatinine Clearance: 67.7 mL/min (by C-G formula based on SCr of 0.83 mg/dL). Liver Function Tests: Recent Labs  Lab 01/27/18 0903 01/30/18 1400 01/31/18 0510  AST 69* 137* 118*  ALT 37 59* 54  ALKPHOS 405* 289* 242*  BILITOT 1.9* 1.9* 2.0*  PROT 7.6 6.9 5.9*  ALBUMIN 2.7* 2.4* 2.1*   No results for input(s): LIPASE, AMYLASE in the last 168 hours. No results for input(s): AMMONIA in the last 168 hours. Coagulation Profile: Recent Labs  Lab 01/30/18 1400  INR 1.24   Cardiac Enzymes: No results for input(s): CKTOTAL, CKMB, CKMBINDEX, TROPONINI in the last 168 hours. BNP (last 3 results) No results for input(s): PROBNP in the last 8760 hours. HbA1C: No results for input(s): HGBA1C in the last 72 hours. CBG: No results for input(s): GLUCAP in the last 168 hours. Lipid Profile: No results for input(s): CHOL, HDL, LDLCALC, TRIG, CHOLHDL, LDLDIRECT in the last 72 hours. Thyroid Function Tests: No results for input(s): TSH, T4TOTAL, FREET4, T3FREE, THYROIDAB in the last 72 hours. Anemia Panel: No results for input(s): VITAMINB12, FOLATE, FERRITIN, TIBC, IRON, RETICCTPCT in the last 72 hours. Sepsis Labs: Recent Labs  Lab 01/30/18 1404  LATICACIDVEN 1.06    Recent Results (from the past 240 hour(s))  Culture, blood (single) w Reflex to ID Panel     Status: None (Preliminary result)   Collection Time: 01/27/18 10:35 AM  Result Value Ref Range Status   Specimen Description BLOOD PORTA CATH  Final   Special Requests   Final    BOTTLES DRAWN AEROBIC AND ANAEROBIC Blood Culture results may not be optimal due to an excessive volume of blood received in culture bottles   Culture   Final    NO GROWTH 3 DAYS Performed at [facility], [place] [address] [place], [place] [address]    Report Status PENDING  Incomplete  Culture, blood (Routine x 2)     Status: None (Preliminary result)    Collection Time: 01/30/18  2:00 PM  Result Value Ref Range Status   Specimen Description   Final    BLOOD LEFT ANTECUBITAL Performed at [facility], [place] [address] [place], [place] [address]    Special Requests   Final    BOTTLES DRAWN AEROBIC AND ANAEROBIC Blood Culture adequate volume Performed at [place] [address] [place], [place] [address]    Culture PENDING  Incomplete   Report Status PENDING  Incomplete      Radiology Studies: No results found.    Scheduled Meds: . enoxaparin (LOVENOX) injection  40 mg Subcutaneous Q24H  . ferrous sulfate  325 mg Oral TID WC  . morphine  15 mg Oral Q12H  . prednisoLONE  10 mg Oral  QAC breakfast   Continuous Infusions: . piperacillin-tazobactam (ZOSYN)  IV 3.375 g (01/31/18 0606)     LOS: 1 day    Time spent: Total of 35 minutes spent with pt, greater than 50% of which was spent in discussion of  treatment, counseling and coordination of care   Chipper Oman, MD Pager: Text Page via [email or web address]   If 7PM-7AM, please contact night-coverage [email or web address] 01/31/2018, 11:45 AM   Note - This record has been created using [employer]. Chart creation errors have been sought, but may not always have been located. Such creation errors do not reflect on the standard of medical care.

## 2018-02-01 ENCOUNTER — Inpatient Hospital Stay (HOSPITAL_COMMUNITY): Payer: Medicaid Other

## 2018-02-01 LAB — CBC WITH DIFFERENTIAL/PLATELET
BASOS ABS: 0 10*3/uL (ref 0.0–0.1)
Basophils Relative: 1 %
Eosinophils Absolute: 0 10*3/uL (ref 0.0–0.7)
Eosinophils Relative: 1 %
HCT: 27.5 % — ABNORMAL LOW (ref 36.0–46.0)
Hemoglobin: 8.4 g/dL — ABNORMAL LOW (ref 12.0–15.0)
LYMPHS ABS: 0.2 10*3/uL — AB (ref 0.7–4.0)
Lymphocytes Relative: 11 %
MCH: 27.6 pg (ref 26.0–34.0)
MCHC: 30.5 g/dL (ref 30.0–36.0)
MCV: 90.5 fL (ref 78.0–100.0)
MONO ABS: 0 10*3/uL — AB (ref 0.1–1.0)
Monocytes Relative: 2 %
NEUTROS ABS: 1.3 10*3/uL — AB (ref 1.7–7.7)
Neutrophils Relative %: 85 %
PLATELETS: 84 10*3/uL — AB (ref 150–400)
RBC: 3.04 MIL/uL — ABNORMAL LOW (ref 3.87–5.11)
RDW: 21.7 % — AB (ref 11.5–15.5)
WBC: 1.5 10*3/uL — AB (ref 4.0–10.5)

## 2018-02-01 LAB — CULTURE, BLOOD (SINGLE): Culture: NO GROWTH

## 2018-02-01 LAB — BASIC METABOLIC PANEL
Anion gap: 6 (ref 5–15)
BUN: 13 mg/dL (ref 6–20)
CO2: 26 mmol/L (ref 22–32)
CREATININE: 0.83 mg/dL (ref 0.44–1.00)
Calcium: 8.5 mg/dL — ABNORMAL LOW (ref 8.9–10.3)
Chloride: 107 mmol/L (ref 101–111)
GFR calc Af Amer: >60 mL/min (ref >60)
GLUCOSE: 138 mg/dL — AB (ref 65–99)
Potassium: 4.2 mmol/L (ref 3.5–5.1)
SODIUM: 139 mmol/L (ref 135–145)

## 2018-02-01 LAB — MAGNESIUM: Magnesium: 1.9 mg/dL (ref 1.7–2.4)

## 2018-02-01 MED ORDER — IOPAMIDOL (ISOVUE-300) INJECTION 61%
INTRAVENOUS | Status: AC
Start: 2018-02-01 — End: 2018-02-02
  Filled 2018-02-01: qty 100

## 2018-02-01 MED ORDER — IOPAMIDOL (ISOVUE-300) INJECTION 61%
100.0000 mL | Freq: Once | INTRAVENOUS | Status: AC | PRN
  Administered 2018-02-01: 100 mL via INTRAVENOUS

## 2018-02-01 MED ORDER — IOPAMIDOL (ISOVUE-300) INJECTION 61%
INTRAVENOUS | Status: AC
  Filled 2018-02-01: qty 30

## 2018-02-01 MED ORDER — IOPAMIDOL (ISOVUE-300) INJECTION 61%: 15.0000 mL | Freq: Once | INTRAVENOUS | Status: DC | PRN

## 2018-02-01 MED ORDER — BARIUM SULFATE 2.1 % PO SUSP
450.0000 mL | Freq: Once | ORAL | Status: AC
  Administered 2018-02-01: 450 mL via ORAL

## 2018-02-01 MED ORDER — IOPAMIDOL (ISOVUE-300) INJECTION 61%
INTRAVENOUS | Status: AC
  Filled 2018-02-01: qty 100

## 2018-02-01 NOTE — Progress Notes (Signed)
Patient ID: Crystal Carlson, female   DOB: 01-03-1966, 52 y.o.   MRN: [id number] Pt presented to [place] dept today for paracentesis. On limited US abd in all four quadrants there is a small amt ascites present in deep pelvis ant to bladder but not safely accessible for needle access due to overlying bowel loops. Procedure cancelled. Can reevaluate later in week if needed.

## 2018-02-01 NOTE — Progress Notes (Signed)
PROGRESS NOTE [facility]   Yamil Dougher   GLO:[id number] DOB: [birth year]  DOA: 01/30/2018 PCP: Mack Hook, MD   Brief Narrative:  Jamyra Zweig is a 52 y/o F with medical significant of pancreatic cancer metastasized to the liver.  Patient had paracentesis done on 5/30 which came positive for Klebsiella and enterococcus and was being treated as an outpatient with Augmentin, however patient continued to have abdominal pain and fever at home and decided to come to the ER for further evaluation.  Upon ED evaluation she was found to be febrile, hemoglobin is 7.8, platelet count 85 and total bilirubin of 1.9.  Cultures were obtained and patient was admitted for peritonitis failure of outpatient therapies.  Subjective: Patient seen and examined he needs to feel well.  Still spiking fever.  Abdominal pain resolved.  Assessment & Plan: Peritonitis due to Klebsiella/enterococcus faecalis Outpatient treatment with Augmentin, continue Zosyn,  Follow-up blood culture no growth to date.  No enough fluid for paracentesis.  CT of the abdomen ordered to rule out abscess.  Continue to monitor, will discuss with ID for therapeutic options.  Stage IV metastatic pancreatic cancer with mets to the liver Followed by Dr.Sherril as an outpatient On chemotherapy   Pancytopenia  Due to chemo No signs of bleeding, continue to monitor   Cancer related pain  Continue home dose Ms contin and Diluadid   DVT prophylaxis: SCD's, hold Lovenox due to thrombocytopenia  Code Status: Full Code  Family Communication: None at bedside  Disposition Plan: Home in 1-2 days   Consultants:   None   Procedures:   None   Antimicrobials: Anti-infectives (From admission, onward)   Start     Dose/Rate Route Frequency Ordered Stop   01/30/18 2200  piperacillin-tazobactam (ZOSYN) IVPB 3.375 g     3.375 g 12.5 mL/hr over 240 Minutes Intravenous Every 8 hours 01/30/18 1756     01/30/18 1615   piperacillin-tazobactam (ZOSYN) IVPB 3.375 g     3.375 g 100 mL/hr over 30 Minutes Intravenous  Once 01/30/18 1601 01/30/18 1751       Objective: Vitals:   01/31/18 2135 01/31/18 2330 02/01/18 0535 02/01/18 1244  BP: 127/85  108/83 98/74  Pulse: 87  68 76  Resp: 16  15   Temp: (!) 101.1 F (38.4 C) 99.7 F (37.6 C) 97.8 F (36.6 C) 97.8 F (36.6 C)  TempSrc: Oral Oral Oral Oral  SpO2: 98%  99% 100%  Weight:      Height:        Intake/Output Summary (Last 24 hours) at 02/01/2018 1923 Last data filed at 02/01/2018 1300 Gross per 24 hour  Intake 240 ml  Output -  Net 240 ml   Filed Weights   01/30/18 1832  Weight: 52.9 kg (116 lb 11.2 oz)    Examination:  General: Pt is alert, awake, not in acute distress Cardiovascular: RRR, S1/S2 +, no rubs, no gallops Respiratory: CTA bilaterally, no wheezing, no rhonchi Abdominal: Soft, NT, ND, bowel sounds + Extremities: no edema, no cyanosis    Data Reviewed: I have personally reviewed following labs and imaging studies  CBC: Recent Labs  Lab 01/27/18 0903 01/30/18 1400 01/31/18 0510 02/01/18 0417  WBC 4.4 3.9* 2.8* 1.5*  NEUTROABS 3.5 3.6  --  1.3*  HGB 8.7* 7.8* 7.5* 8.4*  HCT 27.3* 25.1* 23.6* 27.5*  MCV 88.4 89.0 89.7 90.5  PLT 95* 85* 103* 84*   Basic Metabolic Panel: Recent Labs  Lab 01/27/18 0903 01/30/18 1400  01/31/18 0510 02/01/18 0417  NA 139 138 139 139  K 3.5 3.9 3.8 4.2  CL 105 106 107 107  CO2 27 25 27 26   GLUCOSE 120 116* 94 138*  BUN 10 15 15 13   CREATININE 0.73 0.61 0.83 0.83  CALCIUM 9.1 8.4* 8.2* 8.5*  MG  --   --   --  1.9   GFR: Estimated Creatinine Clearance: 67.7 mL/min (by C-G formula based on SCr of 0.83 mg/dL). Liver Function Tests: Recent Labs  Lab 01/27/18 0903 01/30/18 1400 01/31/18 0510  AST 69* 137* 118*  ALT 37 59* 54  ALKPHOS 405* 289* 242*  BILITOT 1.9* 1.9* 2.0*  PROT 7.6 6.9 5.9*  ALBUMIN 2.7* 2.4* 2.1*   No results for input(s): LIPASE, AMYLASE in the  last 168 hours. No results for input(s): AMMONIA in the last 168 hours. Coagulation Profile: Recent Labs  Lab 01/30/18 1400  INR 1.24   Cardiac Enzymes: No results for input(s): CKTOTAL, CKMB, CKMBINDEX, TROPONINI in the last 168 hours. BNP (last 3 results) No results for input(s): PROBNP in the last 8760 hours. HbA1C: No results for input(s): HGBA1C in the last 72 hours. CBG: No results for input(s): GLUCAP in the last 168 hours. Lipid Profile: No results for input(s): CHOL, HDL, LDLCALC, TRIG, CHOLHDL, LDLDIRECT in the last 72 hours. Thyroid Function Tests: No results for input(s): TSH, T4TOTAL, FREET4, T3FREE, THYROIDAB in the last 72 hours. Anemia Panel: No results for input(s): VITAMINB12, FOLATE, FERRITIN, TIBC, IRON, RETICCTPCT in the last 72 hours. Sepsis Labs: Recent Labs  Lab 01/30/18 1404  LATICACIDVEN 1.06    Recent Results (from the past 240 hour(s))  Culture, blood (single) w Reflex to ID Panel     Status: None   Collection Time: 01/27/18 10:35 AM  Result Value Ref Range Status   Specimen Description BLOOD PORTA CATH  Final   Special Requests   Final    BOTTLES DRAWN AEROBIC AND ANAEROBIC Blood Culture results may not be optimal due to an excessive volume of blood received in culture bottles   Culture   Final    NO GROWTH 5 DAYS Performed at [facility], [place] [address] [place], [place] [address]    Report Status 02/01/2018 FINAL  Final  Culture, blood (Routine x 2)     Status: None (Preliminary result)   Collection Time: 01/30/18 12:51 PM  Result Value Ref Range Status   Specimen Description   Final    BLOOD RIGHT FOREARM Performed at [facility] [address], [place], [place] [address]    Special Requests   Final    BOTTLES DRAWN AEROBIC AND ANAEROBIC Blood Culture adequate volume Performed at [facility] [address] [place], [place] [address]    Culture   Final    NO GROWTH 2 DAYS Performed  at [facility] [address], [place], [place] [address]    Report Status PENDING  Incomplete  Culture, blood (Routine x 2)     Status: None (Preliminary result)   Collection Time: 01/30/18  2:00 PM  Result Value Ref Range Status   Specimen Description   Final    BLOOD LEFT ANTECUBITAL Performed at [facility], [place] [address], [place], [place] [address]    Special Requests   Final    BOTTLES DRAWN AEROBIC AND ANAEROBIC Blood Culture adequate volume Performed at [facility] [address] [place], [place] [address]  Culture   Final    NO GROWTH 2 DAYS Performed at [facility], [place] [address], [place], [place] [address]    Report Status PENDING  Incomplete      Radiology Studies: Ct Abdomen Pelvis W Contrast  Result Date: 02/01/2018 CLINICAL DATA:  Abdominal pain and fever. Recently treated for spontaneous bacterial peritonitis. History of metastatic pancreatic cancer. EXAM: CT ABDOMEN AND PELVIS WITH CONTRAST TECHNIQUE: Multidetector CT imaging of the abdomen and pelvis was performed using the standard protocol following bolus administration of intravenous contrast. CONTRAST:  154mL ISOVUE-300 IOPAMIDOL (ISOVUE-300) INJECTION 61% COMPARISON:  CT 01/12/2018. FINDINGS: Lower chest: Trace pericardial fluid. No significant pleural effusion. There is mild dependent atelectasis at both lung bases. Hepatobiliary: The metallic stent within the common bile duct remains patent and there is persistent pneumobilia. The gallbladder appears normal. There is progressive diffuse heterogeneity throughout the hepatic parenchyma. The dominant metastases reported previously have not significantly changed, measuring approximately 13 x 16 mm in segment 8 (image 15/2) and 11 mm in segment 6 (image 19/2). However, there are multiple new or enlarging low-density lesions throughout the right lobe which may reflect treated metastases or small abscesses, all measuring 11 mm  or less in greatest dimension. Pancreas: The ill-defined mass surrounding the biliary stent has not significantly changed. There is marked atrophy throughout the pancreatic body and tail. Spleen: Stable splenomegaly without focal abnormality. Adrenals/Urinary Tract: Both adrenal glands appear normal. Both kidneys no appear unremarkable without significant residual heterogeneity in the left kidney as seen on prior study. There is no hydronephrosis or delay in contrast excretion. The bladder is mildly distended without focal abnormality. Stomach/Bowel: No focal bowel wall thickening or significant distention. Diffuse mild gastric and colonic wall thickening attributed to ascites. Vascular/Lymphatic: Ill-defined peripancreatic lymph nodes are stable. There is no lower retroperitoneal lymphadenopathy. 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 No acute vascular findings. Reproductive: The uterus and ovaries appear normal. No endometrial thickening suggested on the current study. No adnexal mass. Other: Ascites has decreased in volume compared with the prior study. There is mild edema throughout the mesenteric and retroperitoneal fat. No peritoneal nodularity seen. Musculoskeletal: No acute or significant osseous findings. IMPRESSION: 1. The ascites has decreased in volume compared with the prior study. There is stable edema throughout the intra-abdominal fat. No discrete peritoneal nodularity. 2. The dominant hepatic metastases are similar in size to the study performed 3 weeks ago. Several new or enlarging low-density hepatic lesions are present which could reflect treated metastases or small hepatic abscesses given the patient's current symptoms. 3. Patent biliary stent. 4. Grossly stable appearance of the ill-defined pancreatic head mass and associated chronic central venous obstruction. Electronically Signed   By: Richardean Sale M.D.   On: 02/01/2018 16:09   US  Abdomen Limited  Result Date: 02/01/2018 CLINICAL DATA:  History of pancreatic cancer with concern for recurrent symptomatic ascites. Please perform ascites search ultrasound ultrasound-guided paracentesis as indicated. EXAM: LIMITED ABDOMEN ULTRASOUND FOR ASCITES TECHNIQUE: Limited ultrasound survey for ascites was performed in all four abdominal quadrants. COMPARISON:  Ultrasound-guided paracentesis-01/14/2018; CT abdomen pelvis-01/12/2018 FINDINGS: Sonographic evaluation of the abdomen demonstrates a trace amount of ascitic fluid within the right (image 3) and left (image 6) lower abdominal quadrants, too small to allow for safe ultrasound-guided paracentesis. IMPRESSION: Trace amount of intra-abdominal ascites, too small to allow for safe ultrasound-guided paracentesis. Electronically Signed   By: Sandi Mariscal M.D.   On: 02/01/2018 14:24  Scheduled Meds: . ferrous sulfate  325 mg Oral TID WC  . iopamidol      . iopamidol      . morphine  15 mg Oral Q12H  . prednisoLONE  10 mg Oral QAC breakfast   Continuous Infusions: . piperacillin-tazobactam (ZOSYN)  IV 3.375 g (02/01/18 1457)     LOS: 2 days    Time spent: Total of 25 minutes spent with pt, greater than 50% of which was spent in discussion of  treatment, counseling and coordination of care   Chipper Oman, MD Pager: Text Page via [email or web address]   If 7PM-7AM, please contact night-coverage [email or web address] 02/01/2018, 7:23 PM   Note - This record has been created using [employer]. Chart creation errors have been sought, but may not always have been located. Such creation errors do not reflect on the standard of medical care.

## 2018-02-02 DIAGNOSIS — K659 Peritonitis, unspecified: Secondary | ICD-10-CM

## 2018-02-02 LAB — CBC WITH DIFFERENTIAL/PLATELET
Basophils Absolute: 0 10*3/uL (ref 0.0–0.1)
Basophils Relative: 1 %
EOS PCT: 4 %
Eosinophils Absolute: 0 10*3/uL (ref 0.0–0.7)
HEMATOCRIT: 23.4 % — AB (ref 36.0–46.0)
Hemoglobin: 7.4 g/dL — ABNORMAL LOW (ref 12.0–15.0)
LYMPHS ABS: 0.5 10*3/uL — AB (ref 0.7–4.0)
Lymphocytes Relative: 41 %
MCH: 27.9 pg (ref 26.0–34.0)
MCHC: 31.6 g/dL (ref 30.0–36.0)
MCV: 88.3 fL (ref 78.0–100.0)
MONOS PCT: 3 %
Monocytes Absolute: 0 10*3/uL — ABNORMAL LOW (ref 0.1–1.0)
NEUTROS PCT: 51 %
Neutro Abs: 0.7 10*3/uL — ABNORMAL LOW (ref 1.7–7.7)
Platelets: 91 10*3/uL — ABNORMAL LOW (ref 150–400)
RBC: 2.65 MIL/uL — AB (ref 3.87–5.11)
RDW: 21.5 % — ABNORMAL HIGH (ref 11.5–15.5)
WBC: 1.2 10*3/uL — AB (ref 4.0–10.5)

## 2018-02-02 MED ORDER — CIPROFLOXACIN HCL 500 MG PO TABS: 250.0000 mg | ORAL_TABLET | Freq: Two times a day (BID) | ORAL | 0 refills | Status: AC

## 2018-02-02 MED ORDER — METRONIDAZOLE 500 MG PO TABS
500.0000 mg | ORAL_TABLET | Freq: Three times a day (TID) | ORAL | 0 refills | Status: AC
Start: 2018-02-02 — End: 2018-02-16

## 2018-02-02 MED FILL — metroNIDAZOLE 500 MG TABS: 500 | 14 days supply | Qty: 42 | Fill #0

## 2018-02-02 MED FILL — CIPROFLOXACIN HCL 500 MG TA: 500 | 10 days supply | Qty: 10 | Fill #0

## 2018-02-02 NOTE — Progress Notes (Signed)
CRITICAL VALUE ALERT  Critical Value: WBC 1.2  Date & Time Notied:  02/02/18 at 0452  Provider Notified: n/a  Orders Received/Actions taken: lab value only slightly lower than previous lab value. Rounding physician to address.

## 2018-02-02 NOTE — Discharge Summary (Signed)
Physician Discharge Summary  Crystal Carlson  JAS:[id number]  DOB: 14-Mar-1966  DOA: 01/30/2018 PCP: Mack Hook, MD  Admit date: 01/30/2018 Discharge date: 02/02/2018  Admitted From: Home Disposition: Home  Recommendations for Outpatient Follow-up:  1. Follow up with PCP in 1 week 2. Follow up with Dr. Benay Spice  3. Please obtain BMP/CBC in one week to monitor renal function, WBC, platelets and hemoglobin 4. Please follow up on the following pending results: Final blood cultures  Discharge Condition: Stable CODE STATUS: Full code Diet recommendation: Regular   Brief/Interim Summary: For full details see H&P/Progress note, but in brief, Crystal Carlson is a 52 y/o F with medical significant of pancreatic cancer metastasized to the liver.  Patient had paracentesis done on 5/30 which came positive for Klebsiella and enterococcus and was being treated as an outpatient with Augmentin, however patient continued to have abdominal pain and fever at home and decided to come to the ER for further evaluation.  Upon ED evaluation she was found to be febrile, hemoglobin is 7.8, platelet count 85 and total bilirubin of 1.9.  Cultures were obtained and patient was admitted for peritonitis failure of outpatient therapies.  Subjective: Patient seen and examined, feeling much better.  Afebrile for over 24 hours.  Abdominal pain has resolved.  CT abdomen and pelvis did not show any acute abnormality.  Unable to tap due to low intraperitoneal fluid.  Tolerating diet well.  Discharge Diagnoses/Hospital Course:  Peritonitis due to Klebsiella/enterococcus faecalis Failure of outpatient treatment with Augmentin, patient treated with Zosyn. Blood cultures so far no growth. Case discussed with ID, given clinical improvement with Zosyn recommend to continue antibiotic therapy for peritonitis with ciprofloxacin and Flagyl  For total of 10 days.  CT of the abdomen and pelvis with no acute abnormality/abscess. Follow-up  with PCP.   Stage IV metastatic pancreatic cancer with mets to the liver Followed by Dr.Sherril as an outpatient.  On chemotherapy   Pancytopenia  Due to chemo, receive chemotherapy last week.  Discussed with oncologist okay with levels upon discharge. No signs of bleeding, continue to monitor   Cancer related pain  Continue home dose Ms contin and Diluadid   All other chronic medical condition were stable during the hospitalization.  On the day of the discharge the patient's vitals were stable, and no other acute medical condition were reported by patient. the patient was felt safe to be discharge to home.   Discharge Instructions  You were cared for by a hospitalist during your hospital stay. If you have any questions about your discharge medications or the care you received while you were in the hospital after you are discharged, you can call the unit and asked to speak with the hospitalist on call if the hospitalist that took care of you is not available. Once you are discharged, your primary care physician will handle any further medical issues. Please note that NO REFILLS for any discharge medications will be authorized once you are discharged, as it is imperative that you return to your primary care physician (or establish a relationship with a primary care physician if you do not have one) for your aftercare needs so that they can reassess your need for medications and monitor your lab values.  Discharge Instructions    Call MD for:  difficulty breathing, headache or visual disturbances   Complete by:  As directed    Call MD for:  extreme fatigue   Complete by:  As directed    Call MD for:  hives   Complete by:  As directed    Call MD for:  persistant dizziness or light-headedness   Complete by:  As directed    Call MD for:  persistant nausea and vomiting   Complete by:  As directed    Call MD for:  redness, tenderness, or signs of infection (pain, swelling, redness, odor or  green/yellow discharge around incision site)   Complete by:  As directed    Call MD for:  severe uncontrolled pain   Complete by:  As directed    Call MD for:  temperature >100.4   Complete by:  As directed    Diet - low sodium heart healthy   Complete by:  As directed    Increase activity slowly   Complete by:  As directed      Allergies as of 02/02/2018   No Known Allergies     Medication List    STOP taking these medications   amoxicillin-clavulanate 500-125 MG tablet Commonly known as:  AUGMENTIN   dexamethasone 4 MG tablet Commonly known as:  DECADRON   gabapentin 100 MG capsule Commonly known as:  NEURONTIN   hydrocortisone 2.5 % rectal cream Commonly known as:  ANUSOL-HC   loperamide 2 MG capsule Commonly known as:  IMODIUM   metoCLOPramide 5 MG tablet Commonly known as:  REGLAN   ondansetron 4 MG disintegrating tablet Commonly known as:  ZOFRAN ODT   potassium chloride SA 20 MEQ tablet Commonly known as:  K-DUR,KLOR-CON   prochlorperazine 10 MG tablet Commonly known as:  COMPAZINE   sulfamethoxazole-trimethoprim 800-160 MG tablet Commonly known as:  BACTRIM DS,SEPTRA DS     TAKE these medications   ciprofloxacin 500 MG tablet Commonly known as:  CIPRO Take 0.5 tablets (250 mg total) by mouth 2 (two) times daily for 10 days.   ferrous sulfate 325 (65 FE) MG EC tablet Take 1 tablet (325 mg total) by mouth 3 (three) times daily with meals.   hydrocortisone cream 1 % Apply 1 application topically 3 (three) times daily as needed for itching (minor skin irritation).   HYDROmorphone 4 MG tablet Commonly known as:  DILAUDID Take 1-2 tablets (4-8 mg total) by mouth every 4 (four) hours as needed for severe pain.   lidocaine-prilocaine cream Commonly known as:  EMLA Apply to port site one hour prior to use. Do not rub in. Cover with plastic.   LORazepam 0.5 MG tablet Commonly known as:  ATIVAN Take 1 tablet (0.5 mg total) by mouth every 8 (eight)  hours as needed for anxiety (or nausea).   metroNIDAZOLE 500 MG tablet Commonly known as:  FLAGYL Take 1 tablet (500 mg total) by mouth 3 (three) times daily for 14 days.   morphine 15 MG 12 hr tablet Commonly known as:  MS CONTIN Take 1 tablet (15 mg total) by mouth every 12 (twelve) hours.   naproxen sodium 220 MG tablet Commonly known as:  ALEVE Take 440 mg by mouth 2 (two) times daily as needed (fever).   prednisoLONE 5 MG Tabs tablet Take 2 tablets (10 mg total) by mouth daily.      Follow-up Information    Ladell Pier, MD. Schedule an appointment as soon as possible for a visit in 1 week(s).   Specialty:  Oncology Why:  Hospital follow up Contact information: [place] [address] [phone]        Mack Hook, MD. Schedule an appointment as soon as possible for a  visit in 1 week(s).   Specialty:  Internal Medicine Why:  Hospital follow up  Contact information: [place] [address] [phone]          No Known Allergies  Consultations:  None   Procedures/Studies: Ct Abdomen Pelvis W Contrast  Result Date: 02/01/2018 CLINICAL DATA:  Abdominal pain and fever. Recently treated for spontaneous bacterial peritonitis. History of metastatic pancreatic cancer. EXAM: CT ABDOMEN AND PELVIS WITH CONTRAST TECHNIQUE: Multidetector CT imaging of the abdomen and pelvis was performed using the standard protocol following bolus administration of intravenous contrast. CONTRAST:  136mL ISOVUE-300 IOPAMIDOL (ISOVUE-300) INJECTION 61% COMPARISON:  CT 01/12/2018. FINDINGS: Lower chest: Trace pericardial fluid. No significant pleural effusion. There is mild dependent atelectasis at both lung bases. Hepatobiliary: The metallic stent within the common bile duct remains patent and there is persistent pneumobilia. The gallbladder appears normal. There is progressive diffuse heterogeneity throughout the hepatic parenchyma. The  dominant metastases reported previously have not significantly changed, measuring approximately 13 x 16 mm in segment 8 (image 15/2) and 11 mm in segment 6 (image 19/2). However, there are multiple new or enlarging low-density lesions throughout the right lobe which may reflect treated metastases or small abscesses, all measuring 11 mm or less in greatest dimension. Pancreas: The ill-defined mass surrounding the biliary stent has not significantly changed. There is marked atrophy throughout the pancreatic body and tail. Spleen: Stable splenomegaly without focal abnormality. Adrenals/Urinary Tract: Both adrenal glands appear normal. Both kidneys no appear unremarkable without significant residual heterogeneity in the left kidney as seen on prior study. There is no hydronephrosis or delay in contrast excretion. The bladder is mildly distended without focal abnormality. Stomach/Bowel: No focal bowel wall thickening or significant distention. Diffuse mild gastric and colonic wall thickening attributed to ascites. Vascular/Lymphatic: Ill-defined peripancreatic lymph nodes are stable. There is no lower retroperitoneal lymphadenopathy. 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 No acute vascular findings. Reproductive: The uterus and ovaries appear normal. No endometrial thickening suggested on the current study. No adnexal mass. Other: Ascites has decreased in volume compared with the prior study. There is mild edema throughout the mesenteric and retroperitoneal fat. No peritoneal nodularity seen. Musculoskeletal: No acute or significant osseous findings. IMPRESSION: 1. The ascites has decreased in volume compared with the prior study. There is stable edema throughout the intra-abdominal fat. No discrete peritoneal nodularity. 2. The dominant hepatic metastases are similar in size to the study performed 3 weeks ago. Several new or enlarging low-density hepatic lesions are  present which could reflect treated metastases or small hepatic abscesses given the patient's current symptoms. 3. Patent biliary stent. 4. Grossly stable appearance of the ill-defined pancreatic head mass and associated chronic central venous obstruction. Electronically Signed   By: Richardean Sale M.D.   On: 02/01/2018 16:09   Ct Abdomen Pelvis W Contrast  Result Date: 01/13/2018 CLINICAL DATA:  Restaging pancreatic cancer. EXAM: CT ABDOMEN AND PELVIS WITH CONTRAST TECHNIQUE: Multidetector CT imaging of the abdomen and pelvis was performed using the standard protocol following bolus administration of intravenous contrast. CONTRAST:  133mL ISOVUE-300 IOPAMIDOL (ISOVUE-300) INJECTION 61% COMPARISON:  Multiple previous CT scans. The most recent is 11/11/2017 FINDINGS: Lower chest: The heart is normal in size. No pericardial effusion. No pulmonary lesions to suggest metastatic disease. Eventration of the right hemidiaphragm with overlying subsegmental atelectasis. No pleural effusion. Hepatobiliary: Stable common bile duct stent with pneumobilia. Metastatic pulmonary lesions are slightly smaller. Segment 8 liver lesion on  image number 27 measures 17 x 16 mm and previously measured 23.5 x 17.5 mm. Right hepatic lobe lesion on image number 35 in segment 6 measures 13 x 10 mm and previously measured 18 x 12.5 mm. 7 mm lesion more inferiorly in segment 6 appears stable. No new or progressive liver lesions. Pancreas: The pancreatic head mass is difficult to measure exactly but definitely appears smaller when compared to the prior CT examination. My best estimate is the mass measures 4.2 x 3.0 cm and previously measured 6.3 x 4.3 cm. Stable severe atrophy of the pancreatic body and tail and marked dilatation of the main pancreatic duct to the level of the mass. Spleen: Stable splenomegaly due to chronic splenic vein inclusion and portal vein occlusion with cavernous transformation. Extensive portal venous collaterals.  Adrenals/Urinary Tract: The adrenal glands are unremarkable. The left kidney demonstrates patchy areas of peripheral decreased perfusion. This is typically seen with pyelonephritis recommend correlation with urinalysis. The right kidney is unremarkable. No renal or obstructing upper ureteral calculi. Stomach/Bowel: The stomach, duodenum, small bowel and colon are grossly normal. Mild diffuse colonic wall thickening and some areas of small bowel wall thickening may be due to the surrounding ascites and low albumin. No mass or obstruction. Vascular/Lymphatic: The aorta and branch vessels are patent. 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 Stable small peripancreatic lymph nodes. Index node on image number 44 at the level of the celiac axis measures 8 mm and is unchanged. The peritoneal implant in the right pericolic gutter is again demonstrated. It is difficult to measure exactly but the more anterior enhancing portion of the lesion appears smaller. Reproductive: Thickened ill-defined endometrium measuring up to 25 mm. Could not exclude endometrial hyperplasia or endometrial cancer. Pelvic ultrasound may be helpful for further evaluation. Both ovaries appear normal. Other: Large amount of free pelvic fluid. 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 No definite metastatic implants or adenopathy in the pelvis. No inguinal adenopathy. Musculoskeletal: No significant bony findings. IMPRESSION: 1. Interval decrease in size of the pancreatic mass and the hepatic metastasis. 2. Slight interval decrease in size of the right paracolic gutter implant. 3. Worsening abdominal/pelvic ascites. Bowel wall thickening is also likely due to the ascites and low albumin. 4. Chronic portal and splenic vein inclusion with cavernous transformation of the portal vein and extensive portal venous collaterals. 5. Thickened endometrium possibly due to endometrial  hyperplasia. Recommend pelvic ultrasound examination for further evaluation. Electronically Signed   By: Marijo Sanes M.D.   On: 01/13/2018 10:08   US Abdomen Limited  Result Date: 02/01/2018 CLINICAL DATA:  History of pancreatic cancer with concern for recurrent symptomatic ascites. Please perform ascites search ultrasound ultrasound-guided paracentesis as indicated. EXAM: LIMITED ABDOMEN ULTRASOUND FOR ASCITES TECHNIQUE: Limited ultrasound survey for ascites was performed in all four abdominal quadrants. COMPARISON:  Ultrasound-guided paracentesis-01/14/2018; CT abdomen pelvis-01/12/2018 FINDINGS: Sonographic evaluation of the abdomen demonstrates a trace amount of ascitic fluid within the right (image 3) and left (image 6) lower abdominal quadrants, too small to allow for safe ultrasound-guided paracentesis. IMPRESSION: Trace amount of intra-abdominal ascites, too small to allow for safe ultrasound-guided paracentesis. Electronically Signed   By: Sandi Mariscal M.D.   On: 02/01/2018 14:24   US Abdomen Limited  Result Date: 01/06/2018 CLINICAL DATA:  52 year old female with pancreatic cancer and abdominal distension concerning for ascites EXAM: LIMITED ABDOMEN ULTRASOUND FOR ASCITES TECHNIQUE: Limited ultrasound survey for ascites was performed in all four abdominal quadrants. COMPARISON:  Prior CT abdomen/pelvis 11/11/2017 FINDINGS: Sonographic interrogation of the 4 quadrants of the abdomen demonstrates trace perihepatic ascites. There is insufficient fluid for safe paracentesis. Paracentesis was not performed. IMPRESSION: Trace perihepatic ascites, insufficient for paracentesis. Electronically Signed   By: Jacqulynn Cadet M.D.   On: 01/06/2018 13:54   US Paracentesis  Result Date: 01/14/2018 INDICATION: Patient with history of pancreatic cancer, ascites. Request made for diagnostic and therapeutic paracentesis. EXAM: ULTRASOUND GUIDED DIAGNOSTIC AND THERAPEUTIC PARACENTESIS MEDICATIONS: None  COMPLICATIONS: None immediate. PROCEDURE: Informed written consent was obtained from the patient after a discussion of the risks, benefits and alternatives to treatment. A timeout was performed prior to the initiation of the procedure. Initial ultrasound scanning demonstrates a small to moderate amount of ascites within the right lower abdominal quadrant. The right lower abdomen was prepped and draped in the usual sterile fashion. 1% lidocaine was used for local anesthesia. Following this, a 19 gauge, 7-cm, Yueh catheter was introduced. An ultrasound image was saved for documentation purposes. The paracentesis was performed. The catheter was removed and a dressing was applied. The patient tolerated the procedure well without immediate post procedural complication. FINDINGS: A total of approximately 2.4 liters of hazy, amber/blood-tinged fluid was removed. Samples were sent to the laboratory as requested by the clinical team. IMPRESSION: Successful ultrasound-guided diagnostic and therapeutic paracentesis yielding 2.4 liters of peritoneal fluid. Read by: Rowe Robert, PA-C Electronically Signed   By: Corrie Mckusick D.O.   On: 01/14/2018 12:04    Discharge Exam: Vitals:   02/01/18 2132 02/02/18 0508  BP: 120/84 104/78  Pulse: 76 72  Resp: 16 16  Temp: 98.4 F (36.9 C) 97.9 F (36.6 C)  SpO2: 100% 98%   Vitals:   02/01/18 0535 02/01/18 1244 02/01/18 2132 02/02/18 0508  BP: 108/83 98/74 120/84 104/78  Pulse: 68 76 76 72  Resp: 15  16 16   Temp: 97.8 F (36.6 C) 97.8 F (36.6 C) 98.4 F (36.9 C) 97.9 F (36.6 C)  TempSrc: Oral Oral Oral Oral  SpO2: 99% 100% 100% 98%  Weight:      Height:        General: Pt is alert, awake, not in acute distress Cardiovascular: RRR, S1/S2 +, no rubs, no gallops Respiratory: CTA bilaterally, no wheezing, no rhonchi Abdominal: Soft, NT, ND, bowel sounds + Extremities: no edema, no cyanosis   The results of significant diagnostics from this hospitalization  (including imaging, microbiology, ancillary and laboratory) are listed below for reference.     Microbiology: Recent Results (from the past 240 hour(s))  Culture, blood (single) w Reflex to ID Panel     Status: None   Collection Time: 01/27/18 10:35 AM  Result Value Ref Range Status   Specimen Description BLOOD PORTA CATH  Final   Special Requests   Final    BOTTLES DRAWN AEROBIC AND ANAEROBIC Blood Culture results may not be optimal due to an excessive volume of blood received in culture bottles   Culture   Final    NO GROWTH 5 DAYS Performed at [facility], [place] [address] [place], [place] [address]    Report Status 02/01/2018 FINAL  Final  Culture, blood (Routine x 2)     Status: None (Preliminary result)   Collection Time: 01/30/18 12:51 PM  Result Value Ref Range Status   Specimen Description   Final    BLOOD RIGHT FOREARM Performed at [facility] [address] [place], [place] [address]    Special Requests  Final    BOTTLES DRAWN AEROBIC AND ANAEROBIC Blood Culture adequate volume Performed at [facility] [address], [place], [place] [address]    Culture   Final    NO GROWTH 2 DAYS Performed at [facility] [address], [place], [place] [address]    Report Status PENDING  Incomplete  Culture, blood (Routine x 2)     Status: None (Preliminary result)   Collection Time: 01/30/18  2:00 PM  Result Value Ref Range Status   Specimen Description   Final    BLOOD LEFT ANTECUBITAL Performed at [facility], [place] [address], [place], [place] [address]    Special Requests   Final    BOTTLES DRAWN AEROBIC AND ANAEROBIC Blood Culture adequate volume Performed at [facility] [address] [place], [place] [address]    Culture   Final    NO GROWTH 2 DAYS Performed at [facility] [address] [place],  [address]    Report Status PENDING  Incomplete     Labs: BNP  (last 3 results) No results for input(s): BNP in the last 8760 hours. Basic Metabolic Panel: Recent Labs  Lab 01/27/18 0903 01/30/18 1400 01/31/18 0510 02/01/18 0417  NA 139 138 139 139  K 3.5 3.9 3.8 4.2  CL 105 106 107 107  CO2 27 25 27 26   GLUCOSE 120 116* 94 138*  BUN 10 15 15 13   CREATININE 0.73 0.61 0.83 0.83  CALCIUM 9.1 8.4* 8.2* 8.5*  MG  --   --   --  1.9   Liver Function Tests: Recent Labs  Lab 01/27/18 0903 01/30/18 1400 01/31/18 0510  AST 69* 137* 118*  ALT 37 59* 54  ALKPHOS 405* 289* 242*  BILITOT 1.9* 1.9* 2.0*  PROT 7.6 6.9 5.9*  ALBUMIN 2.7* 2.4* 2.1*   No results for input(s): LIPASE, AMYLASE in the last 168 hours. No results for input(s): AMMONIA in the last 168 hours. CBC: Recent Labs  Lab 01/27/18 0903 01/30/18 1400 01/31/18 0510 02/01/18 0417 02/02/18 0347  WBC 4.4 3.9* 2.8* 1.5* 1.2*  NEUTROABS 3.5 3.6  --  1.3* 0.7*  HGB 8.7* 7.8* 7.5* 8.4* 7.4*  HCT 27.3* 25.1* 23.6* 27.5* 23.4*  MCV 88.4 89.0 89.7 90.5 88.3  PLT 95* 85* 103* 84* 91*   Cardiac Enzymes: No results for input(s): CKTOTAL, CKMB, CKMBINDEX, TROPONINI in the last 168 hours. BNP: Invalid input(s): POCBNP CBG: No results for input(s): GLUCAP in the last 168 hours. D-Dimer No results for input(s): DDIMER in the last 72 hours. Hgb A1c No results for input(s): HGBA1C in the last 72 hours. Lipid Profile No results for input(s): CHOL, HDL, LDLCALC, TRIG, CHOLHDL, LDLDIRECT in the last 72 hours. Thyroid function studies No results for input(s): TSH, T4TOTAL, T3FREE, THYROIDAB in the last 72 hours.  Invalid input(s): FREET3 Anemia work up No results for input(s): VITAMINB12, FOLATE, FERRITIN, TIBC, IRON, RETICCTPCT in the last 72 hours. Urinalysis    Component Value Date/Time   COLORURINE AMBER (A) 01/30/2018 1543   APPEARANCEUR CLEAR 01/30/2018 1543   LABSPEC 1.017 01/30/2018 1543   LABSPEC 1.020 07/24/2017 0847   PHURINE 6.0 01/30/2018 1543   GLUCOSEU NEGATIVE  01/30/2018 1543   GLUCOSEU Negative 07/24/2017 0847   HGBUR NEGATIVE 01/30/2018 1543   BILIRUBINUR NEGATIVE 01/30/2018 1543   BILIRUBINUR Negative 07/24/2017 0847   KETONESUR NEGATIVE 01/30/2018 1543   PROTEINUR NEGATIVE 01/30/2018 1543   UROBILINOGEN 0.2  07/24/2017 0847   NITRITE NEGATIVE 01/30/2018 1543   LEUKOCYTESUR TRACE (A) 01/30/2018 1543   LEUKOCYTESUR Negative 07/24/2017 0847   Sepsis Labs Invalid input(s): PROCALCITONIN,  WBC,  LACTICIDVEN Microbiology Recent Results (from the past 240 hour(s))  Culture, blood (single) w Reflex to ID Panel     Status: None   Collection Time: 01/27/18 10:35 AM  Result Value Ref Range Status   Specimen Description BLOOD PORTA CATH  Final   Special Requests   Final    BOTTLES DRAWN AEROBIC AND ANAEROBIC Blood Culture results may not be optimal due to an excessive volume of blood received in culture bottles   Culture   Final    NO GROWTH 5 DAYS Performed at [facility], [place] [address] [place], [place] [address]    Report Status 02/01/2018 FINAL  Final  Culture, blood (Routine x 2)     Status: None (Preliminary result)   Collection Time: 01/30/18 12:51 PM  Result Value Ref Range Status   Specimen Description   Final    BLOOD RIGHT FOREARM Performed at [facility] [address] [place], [place] [address]    Special Requests   Final    BOTTLES DRAWN AEROBIC AND ANAEROBIC Blood Culture adequate volume Performed at [facility] [address] [place], [place] [address]    Culture   Final    NO GROWTH 2 DAYS Performed at [facility] [address] [place], [place] [address]    Report Status PENDING  Incomplete  Culture, blood (Routine x 2)     Status: None (Preliminary result)   Collection Time: 01/30/18  2:00 PM  Result Value Ref Range Status   Specimen Description   Final    BLOOD LEFT ANTECUBITAL Performed at [facility], [place] [address] [place] [address], [place], [place] [address]     Special Requests   Final    BOTTLES DRAWN AEROBIC AND ANAEROBIC Blood Culture adequate volume Performed at [facility] [address] [place], [place] [address]    Culture   Final    NO GROWTH 2 DAYS Performed at [facility] [address] [place], [place] [address]    Report Status PENDING  Incomplete     Time coordinating discharge: 35 minutes  SIGNED:  Chipper Oman, MD  [facility] 02/02/2018, 12:33 PM  Pager please text page via  [email or web address]  Note - This record has been created using [employer]. Chart creation errors have been sought, but may not always have been located. Such creation errors do not reflect on the standard of medical care.

## 2018-02-04 LAB — CULTURE, BLOOD (ROUTINE X 2)
CULTURE: NO GROWTH
Culture: NO GROWTH
SPECIAL REQUESTS: ADEQUATE
Special Requests: ADEQUATE

## 2018-02-07 ENCOUNTER — Other Ambulatory Visit: Payer: Self-pay | Admitting: Oncology

## 2018-02-10 ENCOUNTER — Inpatient Hospital Stay (HOSPITAL_BASED_OUTPATIENT_CLINIC_OR_DEPARTMENT_OTHER): Payer: Medicaid Other | Admitting: Oncology

## 2018-02-10 ENCOUNTER — Telehealth: Payer: Self-pay | Admitting: Oncology

## 2018-02-10 ENCOUNTER — Inpatient Hospital Stay: Payer: Medicaid Other

## 2018-02-10 VITALS — BP 110/77 | HR 74 | Temp 98.1°F | Resp 18 | Ht 65.0 in | Wt 121.3 lb

## 2018-02-10 DIAGNOSIS — C259 Malignant neoplasm of pancreas, unspecified: Secondary | ICD-10-CM

## 2018-02-10 DIAGNOSIS — D709 Neutropenia, unspecified: Secondary | ICD-10-CM | POA: Diagnosis not present

## 2018-02-10 DIAGNOSIS — C787 Secondary malignant neoplasm of liver and intrahepatic bile duct: Secondary | ICD-10-CM

## 2018-02-10 DIAGNOSIS — B961 Klebsiella pneumoniae [K. pneumoniae] as the cause of diseases classified elsewhere: Secondary | ICD-10-CM | POA: Diagnosis not present

## 2018-02-10 DIAGNOSIS — C257 Malignant neoplasm of other parts of pancreas: Secondary | ICD-10-CM

## 2018-02-10 DIAGNOSIS — G893 Neoplasm related pain (acute) (chronic): Secondary | ICD-10-CM

## 2018-02-10 DIAGNOSIS — Z95828 Presence of other vascular implants and grafts: Secondary | ICD-10-CM

## 2018-02-10 DIAGNOSIS — B952 Enterococcus as the cause of diseases classified elsewhere: Secondary | ICD-10-CM | POA: Diagnosis not present

## 2018-02-10 DIAGNOSIS — R634 Abnormal weight loss: Secondary | ICD-10-CM | POA: Diagnosis not present

## 2018-02-10 LAB — CMP (CANCER CENTER ONLY)
ALK PHOS: 382 U/L — AB (ref 38–126)
ALT: 41 U/L (ref 0–44)
AST: 66 U/L — ABNORMAL HIGH (ref 15–41)
Albumin: 2.5 g/dL — ABNORMAL LOW (ref 3.5–5.0)
Anion gap: 7 (ref 5–15)
BILIRUBIN TOTAL: 1.3 mg/dL — AB (ref 0.3–1.2)
BUN: 9 mg/dL (ref 6–20)
CALCIUM: 8.8 mg/dL — AB (ref 8.9–10.3)
CHLORIDE: 106 mmol/L (ref 98–111)
CO2: 25 mmol/L (ref 22–32)
CREATININE: 0.73 mg/dL (ref 0.44–1.00)
Glucose, Bld: 113 mg/dL — ABNORMAL HIGH (ref 70–99)
Potassium: 4.1 mmol/L (ref 3.5–5.1)
Sodium: 138 mmol/L (ref 135–145)
TOTAL PROTEIN: 6.9 g/dL (ref 6.5–8.1)

## 2018-02-10 LAB — CBC WITH DIFFERENTIAL (CANCER CENTER ONLY)
Basophils Absolute: 0 10*3/uL (ref 0.0–0.1)
Basophils Relative: 1 %
EOS PCT: 1 %
Eosinophils Absolute: 0 10*3/uL (ref 0.0–0.5)
HEMATOCRIT: 27.8 % — AB (ref 34.8–46.6)
Hemoglobin: 8.9 g/dL — ABNORMAL LOW (ref 11.6–15.9)
LYMPHS ABS: 0.5 10*3/uL — AB (ref 0.9–3.3)
LYMPHS PCT: 20 %
MCH: 28.2 pg (ref 25.1–34.0)
MCHC: 32.2 g/dL (ref 31.5–36.0)
MCV: 87.7 fL (ref 79.5–101.0)
Monocytes Absolute: 0.8 10*3/uL (ref 0.1–0.9)
Monocytes Relative: 33 %
Neutro Abs: 1.1 10*3/uL — ABNORMAL LOW (ref 1.5–6.5)
Neutrophils Relative %: 45 %
PLATELETS: 160 10*3/uL (ref 145–400)
RBC: 3.17 MIL/uL — ABNORMAL LOW (ref 3.70–5.45)
RDW: 23.4 % — ABNORMAL HIGH (ref 11.2–14.5)
WBC: 2.5 10*3/uL — AB (ref 3.9–10.3)

## 2018-02-10 MED ORDER — HYDROMORPHONE HCL 4 MG PO TABS: 4.0000 mg | ORAL_TABLET | ORAL | 0 refills | Status: DC | PRN

## 2018-02-10 MED ORDER — SODIUM CHLORIDE 0.9% FLUSH
10.0000 mL | Freq: Once | INTRAVENOUS | Status: AC
  Administered 2018-02-10: 10 mL
  Filled 2018-02-10: qty 10

## 2018-02-10 MED ORDER — PREDNISONE 5 MG PO TABS: 10.0000 mg | ORAL_TABLET | Freq: Every day | ORAL | 0 refills | Status: DC

## 2018-02-10 MED ORDER — PREDNISOLONE 5 MG PO TABS: 10.0000 mg | ORAL_TABLET | Freq: Every day | ORAL | 0 refills | Status: DC

## 2018-02-10 MED ORDER — HEPARIN SOD (PORK) LOCK FLUSH 100 UNIT/ML IV SOLN
500.0000 [IU] | Freq: Once | INTRAVENOUS | Status: AC
  Administered 2018-02-10: 500 [IU]
  Filled 2018-02-10: qty 5

## 2018-02-10 MED FILL — HYDROmorphone HCL 4 MG TABS: 4 | 5 days supply | Qty: 30 | Fill #0

## 2018-02-10 MED FILL — predniSONE 5 MG TABS: 5 | 30 days supply | Qty: 60 | Fill #0

## 2018-02-10 NOTE — Progress Notes (Signed)
[place] OFFICE PROGRESS NOTE   Diagnosis: Pancreas cancer  INTERVAL HISTORY:    Ms.Crystal Carlson returns for a scheduled visit.  She was last treated with gemcitabine/Abraxane on 01/27/2018. She was admitted 01/30/2018 with abdominal pain and fever.  Blood cultures returned negative.  She was treated with Zosyn.  The case was discussed with infectious disease.  She was discharged on ciprofloxacin and Flagyl. She reports a fever of 100 degrees 2 days ago.  No other fever.  Abdominal pain has improved.  She continues to have abdominal distention.  Her appetite improved with prednisone.  She ran out of prednisone.  Objective:  Vital signs in last 24 hours:  Blood pressure 110/77, pulse 74, temperature 98.1 F (36.7 C), temperature source Oral, resp. rate 18, height 5\' 5"  (1.651 m), weight 121 lb 4.8 oz (55 kg), SpO2 100 %.    HEENT: No thrush, superficial ulceration at the lateral aspect of the tongue bilaterally Resp: Lungs clear bilaterally Cardio: Regular rate and rhythm GI: No hepatosplenomegaly, the abdomen is distended, less than 1 cm umbilical mass Vascular: No leg edema    Portacath/PICC-without erythema  Lab Results:  Lab Results  Component Value Date   WBC 2.5 (L) 02/10/2018   HGB 8.9 (L) 02/10/2018   HCT 27.8 (L) 02/10/2018   MCV 87.7 02/10/2018   PLT 160 02/10/2018   NEUTROABS 1.1 (L) 02/10/2018    CMP  Lab Results  Component Value Date   NA 138 02/10/2018   K 4.1 02/10/2018   CL 106 02/10/2018   CO2 25 02/10/2018   GLUCOSE 113 (H) 02/10/2018   BUN 9 02/10/2018   CREATININE 0.73 02/10/2018   CALCIUM 8.8 (L) 02/10/2018   PROT 6.9 02/10/2018   ALBUMIN 2.5 (L) 02/10/2018   AST 66 (H) 02/10/2018   ALT 41 02/10/2018   ALKPHOS 382 (H) 02/10/2018   BILITOT 1.3 (H) 02/10/2018   GFRNONAA >60 02/10/2018   GFRAA >60 02/10/2018    Lab Results  Component Value Date   CEA1 1.3 05/25/2017    Lab Results  Component Value Date   INR 1.24  01/30/2018    Imaging:  No results found.  Medications: I have reviewed the patient's current medications.   Assessment/Plan: 1. 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 Adenocarcinoma consistent with pancreatobiliary primary.  Cycle 1 FOLFIRINOX 06/18/2017  Cycle 2FOLFIRINOX 07/02/2017  Cycle 3 FOLFIRINOX 07/16/2017 (oxaliplatin dose reduced due to mild thrombocytopenia)  CT 07/24/2017-new splenomegaly and left hydronephrosis  Cycle 4 FOLFIRINOX 08/08/2017  Cycle 5 FOLFIRINOX 08/24/2017  CT abdomen/pelvis 09/03/2017-stable liver lesions and pancreas mass, stable right biliary dilatation, possible new 8 mm right liver lesion versus a vascular phenomena, lesion at the right paracolic gutter was present in the past-significance unclear  Cycle 6 FOLFIRINOX1/29/2019  Cycle7 FOLFIRINOX2/19/2019 (Irinotecan andOxaliplatin dose reduced due to thrombocytopenia)  CT abdomen/pelvis 10/25/2017- findings concerning for enterocolitis. No bowel obstruction. Ill-defined pancreatic head mass. Multiple hepatic hypoenhancing lesions with the largest measuring 1.9 x 2.0 cm, previously 1.7 x 1.7 cm; 9 x 10 mm hypoenhancing lesion in the inferior right lobe of the liver slightly larger compared to the previous CT; 1.6 x 2.4  cm enhancing lesion in the right paracolic gutter increased in size compared to the prior CT.  Cycle 1 gemcitabine/Abraxane 11/04/2017  Cycle 2 gemcitabine/Abraxane 11/25/2017  Cycle 3 gemcitabine/Abraxane 12/08/2017  Cycle 4  gemcitabine 12/23/2017 (Abraxane held secondary to neuropathy)  CTs abdomen/pelvis 01/12/2018- decrease in size of the pancreatic mass and hepatic metastasis; slight interval decrease in size of right paracolic gutter implant; worsening abdominal/pelvic ascites. Bowel wall thickening likely due to ascites and low albumin. 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 Pain secondary to #1.Improved.  3. Weight losssecondary to #1.  4. Microcytic anemia  Transfused with packed red blood cells 07/25/2017, 16 2019, and 11/27/2017  5. History of thrombocytopenia secondary to chemotherapy  6. Port-A-Cath placement 06/04/2017  7. Elevated liver enzymes/bilirubin10/22/2018-likely early biliary obstruction secondary to pancreas cancer, status post ERCP-placement of a common bile duct stent 06/09/2018  8.Postprandial abdominal pain. Protonix initiated 07/16/2017.Trial of Reglan initiated 08/24/2017.  9.Admission with fever 07/24/2017-no source for infection identified, resolved   10.Diarrheafollowing cycle 3 FOLFIRINOX-resolved  11.Left hydronephrosis noted on CT12/03/2017-etiology unclear, urology consulted-stent not recommended.Mild left hydronephrosis noted on CT 10/25/2017. Creatinine normal 10/27/2017.  12. Admission 08/31/2017 with a high fever, blood culture positive for gram-negative rods-Klebsiella pneumoniae identified  13.Fever 10/25/2017 status post evaluation in the emergency department. CT with findings concerning for enterocolitis.   14. Admission 11/11/2017 with a high fever-blood cultures (peripheral and Port-A-Cath)  positive for gram-negative rods--STENOTROPHOMONAS MALTOPHILIA;course of Septra completed as an outpatient.  15. Rash-potentially secondary to gemcitabine or infection, improved  16. Pancytopenia secondary to chemotherapy  17.Intermittent fever. Question "tumor fever", question infection. Now on Bactrim prophylaxis, improved  18.  Ascites.  Paracentesis 01/14/2018- reactive mesothelial cells, mixed inflammation; culture positive for Klebsiella and enterococcus treated with Augmentin  Admission with fever 01/30/2018, blood cultures negative, discharged on ciprofloxacin/Flagyl   Disposition: She appears unchanged.  She has neutropenia.  We decided to hold gemcitabine/Abraxane today.  She will return for an office visit with the plan to complete another cycle of gemcitabine/Abraxane in 1 week.  She continues ciprofloxacin and Flagyl for treatment of peritonitis.  She will contact us for increased abdominal distention.  We will culture the ascitic fluid with the next paracentesis.  She will begin Magic mouthwash for the mucositis.  25 minutes were spent with the patient today.  The majority of the time was used for counseling and coordination of care.  Betsy Coder, MD  02/10/2018  9:41 AM

## 2018-02-10 NOTE — Telephone Encounter (Signed)
Patient husband is aware of appts added per 6/26 los.

## 2018-02-11 LAB — CANCER ANTIGEN 19-9: CA 19-9: 1012 U/mL — ABNORMAL HIGH (ref 0–35)

## 2018-02-15 NOTE — Progress Notes (Signed)
FMLA successfully faxed to [employer] at [phone]. Mailed copy to patient address on file.

## 2018-02-16 ENCOUNTER — Telehealth: Payer: Self-pay | Admitting: Nurse Practitioner

## 2018-02-16 ENCOUNTER — Inpatient Hospital Stay: Payer: Medicaid Other

## 2018-02-16 ENCOUNTER — Inpatient Hospital Stay: Payer: Medicaid Other | Attending: Nurse Practitioner

## 2018-02-16 ENCOUNTER — Inpatient Hospital Stay (HOSPITAL_BASED_OUTPATIENT_CLINIC_OR_DEPARTMENT_OTHER): Payer: Medicaid Other | Admitting: Nurse Practitioner

## 2018-02-16 ENCOUNTER — Encounter: Payer: Self-pay | Admitting: Nurse Practitioner

## 2018-02-16 VITALS — BP 121/89 | HR 77 | Temp 98.5°F | Resp 17 | Ht 65.0 in | Wt 121.1 lb

## 2018-02-16 DIAGNOSIS — C787 Secondary malignant neoplasm of liver and intrahepatic bile duct: Secondary | ICD-10-CM

## 2018-02-16 DIAGNOSIS — Z95828 Presence of other vascular implants and grafts: Secondary | ICD-10-CM

## 2018-02-16 DIAGNOSIS — R634 Abnormal weight loss: Secondary | ICD-10-CM | POA: Insufficient documentation

## 2018-02-16 DIAGNOSIS — G629 Polyneuropathy, unspecified: Secondary | ICD-10-CM | POA: Insufficient documentation

## 2018-02-16 DIAGNOSIS — D649 Anemia, unspecified: Secondary | ICD-10-CM

## 2018-02-16 DIAGNOSIS — R180 Malignant ascites: Secondary | ICD-10-CM | POA: Diagnosis not present

## 2018-02-16 DIAGNOSIS — C257 Malignant neoplasm of other parts of pancreas: Secondary | ICD-10-CM

## 2018-02-16 DIAGNOSIS — R140 Abdominal distension (gaseous): Secondary | ICD-10-CM | POA: Insufficient documentation

## 2018-02-16 DIAGNOSIS — C259 Malignant neoplasm of pancreas, unspecified: Secondary | ICD-10-CM

## 2018-02-16 DIAGNOSIS — D709 Neutropenia, unspecified: Secondary | ICD-10-CM

## 2018-02-16 DIAGNOSIS — Z5111 Encounter for antineoplastic chemotherapy: Secondary | ICD-10-CM | POA: Diagnosis not present

## 2018-02-16 DIAGNOSIS — G893 Neoplasm related pain (acute) (chronic): Secondary | ICD-10-CM | POA: Diagnosis not present

## 2018-02-16 DIAGNOSIS — D61810 Antineoplastic chemotherapy induced pancytopenia: Secondary | ICD-10-CM | POA: Insufficient documentation

## 2018-02-16 LAB — CBC WITH DIFFERENTIAL (CANCER CENTER ONLY)
BASOS PCT: 0 %
Basophils Absolute: 0 10*3/uL (ref 0.0–0.1)
Eosinophils Absolute: 0 10*3/uL (ref 0.0–0.5)
Eosinophils Relative: 0 %
HEMATOCRIT: 30.7 % — AB (ref 34.8–46.6)
HEMOGLOBIN: 9.4 g/dL — AB (ref 11.6–15.9)
Lymphocytes Relative: 9 %
Lymphs Abs: 0.6 10*3/uL — ABNORMAL LOW (ref 0.9–3.3)
MCH: 27.6 pg (ref 25.1–34.0)
MCHC: 30.6 g/dL — AB (ref 31.5–36.0)
MCV: 90.3 fL (ref 79.5–101.0)
MONO ABS: 0.6 10*3/uL (ref 0.1–0.9)
Monocytes Relative: 8 %
NEUTROS ABS: 5.6 10*3/uL (ref 1.5–6.5)
NEUTROS PCT: 83 %
Platelet Count: 185 10*3/uL (ref 145–400)
RBC: 3.4 MIL/uL — ABNORMAL LOW (ref 3.70–5.45)
RDW: 21.4 % — ABNORMAL HIGH (ref 11.2–14.5)
WBC: 6.8 10*3/uL (ref 3.9–10.3)

## 2018-02-16 LAB — CMP (CANCER CENTER ONLY)
ALBUMIN: 2.5 g/dL — AB (ref 3.5–5.0)
ALK PHOS: 356 U/L — AB (ref 38–126)
ALT: 23 U/L (ref 0–44)
AST: 39 U/L (ref 15–41)
Anion gap: 5 (ref 5–15)
BILIRUBIN TOTAL: 1 mg/dL (ref 0.3–1.2)
BUN: 12 mg/dL (ref 6–20)
CALCIUM: 8.7 mg/dL — AB (ref 8.9–10.3)
CO2: 27 mmol/L (ref 22–32)
CREATININE: 0.78 mg/dL (ref 0.44–1.00)
Chloride: 104 mmol/L (ref 98–111)
GFR, Est AFR Am: >60 mL/min (ref >60)
GFR, Estimated: >60 mL/min (ref >60)
GLUCOSE: 204 mg/dL — AB (ref 70–99)
Potassium: 4 mmol/L (ref 3.5–5.1)
SODIUM: 136 mmol/L (ref 135–145)
TOTAL PROTEIN: 7 g/dL (ref 6.5–8.1)

## 2018-02-16 MED ORDER — SODIUM CHLORIDE 0.9% FLUSH
10.0000 mL | Freq: Once | INTRAVENOUS | Status: AC
  Administered 2018-02-16: 10 mL
  Filled 2018-02-16: qty 10

## 2018-02-16 MED ORDER — SODIUM CHLORIDE 0.9 % IV SOLN
Freq: Once | INTRAVENOUS | Status: AC
  Administered 2018-02-16: 14:00:00 via INTRAVENOUS

## 2018-02-16 MED ORDER — PROCHLORPERAZINE MALEATE 10 MG PO TABS
10.0000 mg | ORAL_TABLET | Freq: Once | ORAL | Status: AC
  Administered 2018-02-16: 10 mg via ORAL

## 2018-02-16 MED ORDER — SODIUM CHLORIDE 0.9 % IV SOLN
700.0000 mg/m2 | Freq: Once | INTRAVENOUS | Status: AC
  Administered 2018-02-16: 1102 mg via INTRAVENOUS
  Filled 2018-02-16: qty 28.98

## 2018-02-16 MED ORDER — HEPARIN SOD (PORK) LOCK FLUSH 100 UNIT/ML IV SOLN
500.0000 [IU] | Freq: Once | INTRAVENOUS | Status: AC | PRN
  Administered 2018-02-16: 500 [IU]
  Filled 2018-02-16: qty 5

## 2018-02-16 MED ORDER — SODIUM CHLORIDE 0.9% FLUSH
10.0000 mL | INTRAVENOUS | Status: DC | PRN
  Administered 2018-02-16: 10 mL
  Filled 2018-02-16: qty 10

## 2018-02-16 MED ORDER — PROCHLORPERAZINE MALEATE 10 MG PO TABS
ORAL_TABLET | ORAL | Status: AC
  Filled 2018-02-16: qty 1

## 2018-02-16 MED ORDER — PACLITAXEL PROTEIN-BOUND CHEMO INJECTION 100 MG
80.0000 mg/m2 | Freq: Once | Status: AC
  Administered 2018-02-16: 125 mg via INTRAVENOUS
  Filled 2018-02-16: qty 25

## 2018-02-16 NOTE — Telephone Encounter (Signed)
Scheduled appt per 7/2 los - gave patient AVS and calender per los 

## 2018-02-16 NOTE — Progress Notes (Addendum)
[place] OFFICE PROGRESS NOTE   Diagnosis: Pancreas cancer  INTERVAL HISTORY:   Crystal Carlson returns as scheduled.  Chemotherapy was held last week due to neutropenia.  She overall is feeling well.  She denies fever.  Abdomen remains distended.  She notes bilateral ankle edema.  No nausea.  No diarrhea.  She describes her pain as "okay".  Objective:  Vital signs in last 24 hours:  Blood pressure 121/89, pulse 77, temperature 98.5 F (36.9 C), temperature source Oral, resp. rate 17, height 5\' 5"  (1.651 m), weight 121 lb 1.6 oz (54.9 kg), SpO2 100 %.    HEENT: No thrush or ulcers. Resp: Lungs clear bilaterally. Cardio: Regular rate and rhythm. GI: Abdomen soft, distended.  No hepatomegaly.  No mass. Vascular: Trace bilateral ankle edema.  Skin: No rash. Port-A-Cath without erythema.   Lab Results:  Lab Results  Component Value Date   WBC 2.5 (L) 02/10/2018   HGB 8.9 (L) 02/10/2018   HCT 27.8 (L) 02/10/2018   MCV 87.7 02/10/2018   PLT 160 02/10/2018   NEUTROABS 1.1 (L) 02/10/2018    Imaging:  No results found.  Medications: I have reviewed the patient's current medications.  Assessment/Plan: 1. 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 Adenocarcinoma consistent with pancreatobiliary primary.  Cycle 1 FOLFIRINOX 06/18/2017  Cycle 2FOLFIRINOX 07/02/2017  Cycle 3 FOLFIRINOX 07/16/2017 (oxaliplatin dose reduced due to mild thrombocytopenia)  CT 07/24/2017-new splenomegaly and left hydronephrosis  Cycle 4  FOLFIRINOX 08/08/2017  Cycle 5 FOLFIRINOX 08/24/2017  CT abdomen/pelvis 09/03/2017-stable liver lesions and pancreas mass, stable right biliary dilatation, possible new 8 mm right liver lesion versus a vascular phenomena, lesion at the right paracolic gutter was present in the past-significance unclear  Cycle 6 FOLFIRINOX1/29/2019  Cycle7 FOLFIRINOX2/19/2019 (Irinotecan andOxaliplatin dose reduced due to thrombocytopenia)  CT abdomen/pelvis 10/25/2017- findings concerning for enterocolitis. No bowel obstruction. Ill-defined pancreatic head mass. Multiple hepatic hypoenhancing lesions with the largest measuring 1.9 x 2.0 cm, previously 1.7 x 1.7 cm; 9 x 10 mm hypoenhancing lesion in the inferior right lobe of the liver slightly larger compared to the previous CT; 1.6 x 2.4 cm enhancing lesion in the right paracolic gutter increased in size compared to the prior CT.  Cycle 1 gemcitabine/Abraxane 11/04/2017  Cycle 2 gemcitabine/Abraxane 11/25/2017  Cycle 3 gemcitabine/Abraxane 12/08/2017  Cycle 4 gemcitabine 12/23/2017 (Abraxane held secondary to neuropathy)  CTs abdomen/pelvis 01/12/2018- decrease in size of the pancreatic mass and hepatic metastasis; slight interval decrease in size of right paracolic gutter implant; worsening abdominal/pelvic ascites. Bowel wall thickening likely due to ascites and low albumin. 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 Pain secondary to #1.Improved.  3. Weight losssecondary to #1.  4. Microcytic anemia  Transfused  with packed red blood cells 07/25/2017, 16 2019, and 11/27/2017  5. History of thrombocytopenia  secondary to chemotherapy  6. Port-A-Cath placement 06/04/2017  7. Elevated liver enzymes/bilirubin10/22/2018-likely early biliary obstruction secondary to pancreas cancer, status post ERCP-placement of a common bile duct stent 06/09/2018  8.Postprandial abdominal pain. Protonix initiated 07/16/2017.Trial of Reglan initiated 08/24/2017.  9.Admission with fever 07/24/2017-no source for infection identified, resolved   10.Diarrheafollowing cycle 3 FOLFIRINOX-resolved  11.Left hydronephrosis noted on CT12/03/2017-etiology unclear, urology consulted-stent not recommended.Mild left hydronephrosis noted on CT 10/25/2017. Creatinine normal 10/27/2017.  12. Admission 08/31/2017 with a high fever, blood culture positive for gram-negative rods-Klebsiella pneumoniae identified  13.Fever 10/25/2017 status post evaluation in the emergency department. CT with findings concerning for enterocolitis.   14. Admission 11/11/2017 with a high fever-blood cultures (peripheral and Port-A-Cath) positive for gram-negative rods--STENOTROPHOMONAS MALTOPHILIA;course of Septra completed as an outpatient.  15. Rash-potentially secondary to gemcitabine or infection, improved  16. Pancytopenia secondary to chemotherapy  17.Intermittent fever. Question "tumor fever", question infection. Now on Bactrim prophylaxis, improved  18. Ascites.Paracentesis 01/14/2018- reactive mesothelial cells, mixed inflammation;culture positive for Klebsiella and enterococcustreated withAugmentin  Admission with fever 01/30/2018, blood cultures negative, discharged on ciprofloxacin/Flagyl     Disposition: Crystal Carlson appears stable.  She has completed 6 cycles of gemcitabine/Abraxane.  Treatment was held last week due to neutropenia.  The neutrophil count has recovered.  Plan to proceed with cycle 7  gemcitabine/Abraxane today as scheduled.  Chemotherapy will be dose reduced beginning with today's treatment.  She will return for lab, follow-up and cycle 8 gemcitabine/Abraxane in 2 weeks.  She will contact the office in the interim with any problems.  Patient seen with Dr. Benay Spice.    [name] ANP/GNP-BC   02/16/2018  11:56 AM This was a shared visit with [name].  Crystal Carlson was interviewed and examined.  The white count has recovered.  She will complete another cycle of gemcitabine/Abraxane today.  She will call for a fever.  We will reculture the ascitic fluid with the next paracentesis.  We will dose reduce the gemcitabine and Abraxane with this cycle.  [name], MD

## 2018-02-16 NOTE — Patient Instructions (Signed)
[facility] Discharge Instructions for Patients Receiving Chemotherapy  Today you received the following chemotherapy agents: Abraxane and Gemzar   To help prevent nausea and vomiting after your treatment, we encourage you to take your nausea medication as directed.    If you develop nausea and vomiting that is not controlled by your nausea medication, call the clinic.   BELOW ARE SYMPTOMS THAT SHOULD BE REPORTED IMMEDIATELY:  *FEVER GREATER THAN 100.5 F  *CHILLS WITH OR WITHOUT FEVER  NAUSEA AND VOMITING THAT IS NOT CONTROLLED WITH YOUR NAUSEA MEDICATION  *UNUSUAL SHORTNESS OF BREATH  *UNUSUAL BRUISING OR BLEEDING  TENDERNESS IN MOUTH AND THROAT WITH OR WITHOUT PRESENCE OF ULCERS  *URINARY PROBLEMS  *BOWEL PROBLEMS  UNUSUAL RASH Items with * indicate a potential emergency and should be followed up as soon as possible.  Feel free to call the clinic should you have any questions or concerns. The clinic phone number is [phone].  Please show the CHEMO ALERT CARD at check-in to the Emergency Department and triage nurse.   

## 2018-02-17 ENCOUNTER — Other Ambulatory Visit: Payer: Medicaid Other

## 2018-02-17 ENCOUNTER — Ambulatory Visit: Payer: Medicaid Other | Admitting: Nurse Practitioner

## 2018-02-19 ENCOUNTER — Other Ambulatory Visit: Payer: Self-pay | Admitting: *Deleted

## 2018-02-19 DIAGNOSIS — C787 Secondary malignant neoplasm of liver and intrahepatic bile duct: Secondary | ICD-10-CM

## 2018-02-19 DIAGNOSIS — C259 Malignant neoplasm of pancreas, unspecified: Secondary | ICD-10-CM

## 2018-02-23 ENCOUNTER — Ambulatory Visit (HOSPITAL_COMMUNITY)
Admission: RE | Admit: 2018-02-23 | Discharge: 2018-02-23 | Disposition: A | Discharge status: Home / Self Care | Payer: Medicaid Other | Source: Ambulatory Visit | Attending: Nurse Practitioner | Admitting: Nurse Practitioner

## 2018-02-23 DIAGNOSIS — R188 Other ascites: Secondary | ICD-10-CM | POA: Diagnosis not present

## 2018-02-23 DIAGNOSIS — C259 Malignant neoplasm of pancreas, unspecified: Secondary | ICD-10-CM | POA: Diagnosis present

## 2018-02-23 DIAGNOSIS — C787 Secondary malignant neoplasm of liver and intrahepatic bile duct: Secondary | ICD-10-CM | POA: Insufficient documentation

## 2018-02-23 LAB — GRAM STAIN

## 2018-02-23 MED ORDER — LIDOCAINE HCL 1 % IJ SOLN
INTRAMUSCULAR | Status: AC
  Filled 2018-02-23: qty 20

## 2018-02-23 NOTE — Procedures (Signed)
Ultrasound-guided diagnostic and therapeutic paracentesis performed yielding 1.6 liters of slightly hazy, light yellow fluid. No immediate complications.  The fluid was submitted to the lab for preordered studies.

## 2018-02-24 ENCOUNTER — Encounter: Payer: Medicaid Other | Admitting: Nutrition

## 2018-02-24 ENCOUNTER — Other Ambulatory Visit: Payer: Medicaid Other

## 2018-02-24 ENCOUNTER — Ambulatory Visit: Payer: Medicaid Other | Admitting: Nurse Practitioner

## 2018-02-24 ENCOUNTER — Ambulatory Visit: Payer: Medicaid Other

## 2018-02-25 ENCOUNTER — Telehealth: Payer: Self-pay

## 2018-02-25 NOTE — Telephone Encounter (Addendum)
Returned call pt husband regarding stomach swelling. He states "She just feels heavy. If she eats or drinks she feels extra full. She feels about the same as when she had the fluid removed.  Also she is drinking fluids but she doesn't go to the bathroom often. Patient states that she has been almost a day without urinating (the day before yesterday). She also went to the bathroom for BM and noticed rectal bleeding (dripping initially, then spotting).Blood has now stopped".  Pt husband denies fever. Will consult MD.    Pt husband voiced fever on and off since early afternoon at 101.5 and no alleviation of heaviness. Denies any further urine retention or rectal bleeding.   Per Dr. Benay Spice, the peritoneal fluid is growing a bacteria that is sensitive to levaquin. Levaquin prescribed daily. Pt to monitor fevers and if develops persistent fever pt to go to ED. Pt husband voiced understanding.

## 2018-02-26 LAB — CULTURE, BODY FLUID-BOTTLE: GRAM STAIN: NONE SEEN

## 2018-02-26 LAB — CULTURE, BODY FLUID W GRAM STAIN -BOTTLE

## 2018-02-26 MED ORDER — LEVOFLOXACIN 500 MG PO TABS: 500.0000 mg | ORAL_TABLET | Freq: Every day | ORAL | 0 refills | Status: DC

## 2018-02-26 MED FILL — levoFLOXacin 500 MG TABS: 500 | 30 days supply | Qty: 30 | Fill #0

## 2018-02-26 NOTE — Telephone Encounter (Addendum)
Be sure she is able to urinate, does she feel the urge to urinate?  If she is unable to urinate she will need to go to the emergency room to have a bladder scan and catheterization

## 2018-02-28 ENCOUNTER — Other Ambulatory Visit: Payer: Self-pay | Admitting: Oncology

## 2018-03-01 ENCOUNTER — Telehealth: Payer: Self-pay

## 2018-03-01 NOTE — Telephone Encounter (Signed)
Spoke with pt husband regarding levaquin. Pharmacy closed over the weekend and they were unable to obtain the prescription sent in. On-call service pharmacist sent in new script for 750mg  levaquin x 7days. Per Dr. Benay Spice, pt to resume 500mg , ok to start Wednesday. Pt husband voiced understanding.

## 2018-03-03 ENCOUNTER — Encounter: Payer: Self-pay | Admitting: Oncology

## 2018-03-03 ENCOUNTER — Ambulatory Visit (HOSPITAL_COMMUNITY)
Admission: RE | Admit: 2018-03-03 | Discharge: 2018-03-03 | Disposition: A | Discharge status: Home / Self Care | Payer: Medicaid Other | Source: Ambulatory Visit | Attending: Oncology | Admitting: Oncology

## 2018-03-03 ENCOUNTER — Inpatient Hospital Stay: Payer: Medicaid Other

## 2018-03-03 ENCOUNTER — Inpatient Hospital Stay: Payer: Medicaid Other | Admitting: Nutrition

## 2018-03-03 ENCOUNTER — Telehealth: Payer: Self-pay | Admitting: Oncology

## 2018-03-03 ENCOUNTER — Inpatient Hospital Stay (HOSPITAL_BASED_OUTPATIENT_CLINIC_OR_DEPARTMENT_OTHER): Payer: Medicaid Other | Admitting: Oncology

## 2018-03-03 VITALS — BP 120/90 | HR 81 | Temp 98.3°F | Resp 18 | Ht 65.0 in | Wt 123.9 lb

## 2018-03-03 DIAGNOSIS — Z95828 Presence of other vascular implants and grafts: Secondary | ICD-10-CM

## 2018-03-03 DIAGNOSIS — C259 Malignant neoplasm of pancreas, unspecified: Secondary | ICD-10-CM | POA: Insufficient documentation

## 2018-03-03 DIAGNOSIS — G893 Neoplasm related pain (acute) (chronic): Secondary | ICD-10-CM

## 2018-03-03 DIAGNOSIS — C787 Secondary malignant neoplasm of liver and intrahepatic bile duct: Secondary | ICD-10-CM | POA: Diagnosis present

## 2018-03-03 DIAGNOSIS — R634 Abnormal weight loss: Secondary | ICD-10-CM

## 2018-03-03 DIAGNOSIS — D61810 Antineoplastic chemotherapy induced pancytopenia: Secondary | ICD-10-CM

## 2018-03-03 DIAGNOSIS — R180 Malignant ascites: Secondary | ICD-10-CM | POA: Insufficient documentation

## 2018-03-03 DIAGNOSIS — C257 Malignant neoplasm of other parts of pancreas: Secondary | ICD-10-CM | POA: Diagnosis not present

## 2018-03-03 DIAGNOSIS — Z5111 Encounter for antineoplastic chemotherapy: Secondary | ICD-10-CM | POA: Diagnosis not present

## 2018-03-03 LAB — CMP (CANCER CENTER ONLY)
ALK PHOS: 407 U/L — AB (ref 38–126)
ALT: 27 U/L (ref 0–44)
AST: 59 U/L — ABNORMAL HIGH (ref 15–41)
Albumin: 2.3 g/dL — ABNORMAL LOW (ref 3.5–5.0)
Anion gap: 5 (ref 5–15)
BILIRUBIN TOTAL: 1 mg/dL (ref 0.3–1.2)
BUN: 9 mg/dL (ref 6–20)
CALCIUM: 8.8 mg/dL — AB (ref 8.9–10.3)
CO2: 27 mmol/L (ref 22–32)
Chloride: 105 mmol/L (ref 98–111)
Creatinine: 0.77 mg/dL (ref 0.44–1.00)
GFR, Estimated: >60 mL/min (ref >60)
Glucose, Bld: 124 mg/dL — ABNORMAL HIGH (ref 70–99)
Potassium: 4 mmol/L (ref 3.5–5.1)
Sodium: 137 mmol/L (ref 135–145)
TOTAL PROTEIN: 7.1 g/dL (ref 6.5–8.1)

## 2018-03-03 LAB — CBC WITH DIFFERENTIAL (CANCER CENTER ONLY)
Basophils Absolute: 0 10*3/uL (ref 0.0–0.1)
Basophils Relative: 0 %
EOS ABS: 0 10*3/uL (ref 0.0–0.5)
Eosinophils Relative: 1 %
HEMATOCRIT: 29.8 % — AB (ref 34.8–46.6)
HEMOGLOBIN: 9.2 g/dL — AB (ref 11.6–15.9)
LYMPHS ABS: 0.9 10*3/uL (ref 0.9–3.3)
Lymphocytes Relative: 15 %
MCH: 27.1 pg (ref 25.1–34.0)
MCHC: 30.9 g/dL — AB (ref 31.5–36.0)
MCV: 87.6 fL (ref 79.5–101.0)
MONOS PCT: 12 %
Monocytes Absolute: 0.7 10*3/uL (ref 0.1–0.9)
NEUTROS ABS: 4.4 10*3/uL (ref 1.5–6.5)
NEUTROS PCT: 72 %
Platelet Count: 197 10*3/uL (ref 145–400)
RBC: 3.4 MIL/uL — ABNORMAL LOW (ref 3.70–5.45)
RDW: 19.8 % — ABNORMAL HIGH (ref 11.2–14.5)
WBC Count: 6.1 10*3/uL (ref 3.9–10.3)

## 2018-03-03 LAB — BODY FLUID CELL COUNT WITH DIFFERENTIAL
Lymphs, Fluid: 44 %
MONOCYTE-MACROPHAGE-SEROUS FLUID: 51 % (ref 50–90)
NEUTROPHIL FLUID: 5 % (ref 0–25)
Total Nucleated Cell Count, Fluid: 179 cu mm (ref 0–1000)

## 2018-03-03 LAB — GRAM STAIN

## 2018-03-03 MED ORDER — PROCHLORPERAZINE MALEATE 10 MG PO TABS
ORAL_TABLET | ORAL | Status: AC
  Filled 2018-03-03: qty 1

## 2018-03-03 MED ORDER — GEMCITABINE HCL CHEMO INJECTION 1 GM/26.3ML
700.0000 mg/m2 | Freq: Once | INTRAVENOUS | Status: AC
  Administered 2018-03-03: 1102 mg via INTRAVENOUS
  Filled 2018-03-03: qty 28.98

## 2018-03-03 MED ORDER — HEPARIN SOD (PORK) LOCK FLUSH 100 UNIT/ML IV SOLN
500.0000 [IU] | Freq: Once | INTRAVENOUS | Status: AC | PRN
  Administered 2018-03-03: 500 [IU]
  Filled 2018-03-03: qty 5

## 2018-03-03 MED ORDER — SODIUM CHLORIDE 0.9% FLUSH
10.0000 mL | Freq: Once | INTRAVENOUS | Status: AC
  Administered 2018-03-03: 10 mL
  Filled 2018-03-03: qty 10

## 2018-03-03 MED ORDER — PACLITAXEL PROTEIN-BOUND CHEMO INJECTION 100 MG
80.0000 mg/m2 | Freq: Once | Status: AC
  Administered 2018-03-03: 125 mg via INTRAVENOUS
  Filled 2018-03-03: qty 25

## 2018-03-03 MED ORDER — PROCHLORPERAZINE MALEATE 10 MG PO TABS
10.0000 mg | ORAL_TABLET | Freq: Once | ORAL | Status: AC
  Administered 2018-03-03: 10 mg via ORAL

## 2018-03-03 MED ORDER — SODIUM CHLORIDE 0.9 % IV SOLN
Freq: Once | INTRAVENOUS | Status: AC
Start: 2018-03-03 — End: 2018-03-03
  Administered 2018-03-03: 10:00:00 via INTRAVENOUS

## 2018-03-03 MED ORDER — MORPHINE SULFATE ER 15 MG PO TBCR: 15.0000 mg | EXTENDED_RELEASE_TABLET | Freq: Two times a day (BID) | ORAL | 0 refills | Status: DC

## 2018-03-03 MED ORDER — LIDOCAINE HCL 1 % IJ SOLN
INTRAMUSCULAR | Status: AC
  Filled 2018-03-03: qty 10

## 2018-03-03 MED ORDER — HYDROMORPHONE HCL 4 MG PO TABS: 4.0000 mg | ORAL_TABLET | ORAL | 0 refills | Status: DC | PRN

## 2018-03-03 MED ORDER — SODIUM CHLORIDE 0.9% FLUSH
10.0000 mL | INTRAVENOUS | Status: DC | PRN
  Administered 2018-03-03: 10 mL
  Filled 2018-03-03: qty 10

## 2018-03-03 MED FILL — HYDROmorphone HCL 4 MG TABS: 4 | 3 days supply | Qty: 30 | Fill #0

## 2018-03-03 MED FILL — MAGIC MW (NYS,BEN,MAAL): 12 days supply | Qty: 240 | Fill #0

## 2018-03-03 MED FILL — MORPHINE SULF ER 15 MG TAB: 15 | 30 days supply | Qty: 60 | Fill #0

## 2018-03-03 NOTE — Procedures (Signed)
PROCEDURE SUMMARY:  Successful US guided paracentesis from left lateral abdomen.  Yielded 2 liters of clear yellow fluid.  No immediate complications.  Patient tolerated well.    Susa Bones S Eloise Picone PA-C 03/03/2018 2:28 PM

## 2018-03-03 NOTE — Progress Notes (Signed)
[facility] OFFICE PROGRESS NOTE   Diagnosis: Pancreas Cancer  INTERVAL HISTORY:   [name] pleaded another treatment with gemcitabine/Abraxane on 02/16/2018.  She reports stable abdominal pain.  She continues to have neuropathy symptoms, slightly improved. She underwent paracentesis for 1.6 L of fluid on 02/23/2018.  He had a fever on 02/25/2018.  No further fever. A culture from the peritoneal fluid 02/23/2018 returned positive for strep viridans sensitive to Levaquin.  She was placed on Levaquin.  She reports reaccumulation of the abdominal fluid.  She has lower leg and foot edema. Objective:  Vital signs in last 24 hours:  Blood pressure 120/90, pulse 81, temperature 98.3 F (36.8 C), temperature source Oral, resp. rate 18, height 5\' 5"  (1.651 m), weight 123 lb 14.4 oz (56.2 kg), SpO2 100 %.    HEENT: No thrush or ulcers Resp: Lungs clear bilaterally Cardio: Regular rate and rhythm GI: No hepatomegaly, distended with ascites, no mass, mild tenderness in the upper abdomen bilaterally Vascular: Trace edema at the low leg and foot bilaterally   Portacath/PICC-without erythema  Lab Results:  Lab Results  Component Value Date   WBC 6.1 03/03/2018   HGB 9.2 (L) 03/03/2018   HCT 29.8 (L) 03/03/2018   MCV 87.6 03/03/2018   PLT 197 03/03/2018   NEUTROABS 4.4 03/03/2018    CMP  Lab Results  Component Value Date   NA 137 03/03/2018   K 4.0 03/03/2018   CL 105 03/03/2018   CO2 27 03/03/2018   GLUCOSE 124 (H) 03/03/2018   BUN 9 03/03/2018   CREATININE 0.77 03/03/2018   CALCIUM 8.8 (L) 03/03/2018   PROT 7.1 03/03/2018   ALBUMIN 2.3 (L) 03/03/2018   AST 59 (H) 03/03/2018   ALT 27 03/03/2018   ALKPHOS 407 (H) 03/03/2018   BILITOT 1.0 03/03/2018   GFRNONAA >60 03/03/2018   GFRAA >60 03/03/2018    CA 19-9 on 02/10/2018-1012 Medications: I have reviewed the patient's current medications.   Assessment/Plan: 1. Pancreascancer, stage IV  MRI abdomen  05/25/2017-pancreas neck mass with encasement of the hepatic/splenic/superior mesenteric arteries, liver lesions, portal/splenic vein thrombosis with cavernous transformation of the portal vein  CT chest 05/25/2017-no evidence of metastatic disease to the chest, pancreas neck mass with vascular encasement and portacaval adenopathy, liver metastases not visualized  Elevated CA 19-9  Ultrasound-guided biopsy of a left liver lesion 05/26/2017. Adenocarcinoma consistent with pancreatobiliary primary.  Cycle 1 FOLFIRINOX 06/18/2017  Cycle 2FOLFIRINOX 07/02/2017  Cycle 3 FOLFIRINOX 07/16/2017 (oxaliplatin dose reduced due to mild thrombocytopenia)  CT 07/24/2017-new splenomegaly and left hydronephrosis  Cycle 4 FOLFIRINOX 08/08/2017  Cycle 5 FOLFIRINOX 08/24/2017  CT abdomen/pelvis 09/03/2017-stable liver lesions and pancreas mass, stable right biliary dilatation, possible new 8 mm right liver lesion versus a vascular phenomena, lesion at the right paracolic gutter was present in the past-significance unclear  Cycle 6 FOLFIRINOX1/29/2019  Cycle7 FOLFIRINOX2/19/2019 (Irinotecan andOxaliplatin dose reduced due to thrombocytopenia)  CT abdomen/pelvis 10/25/2017- findings concerning for enterocolitis. No bowel obstruction. Ill-defined pancreatic head mass. Multiple hepatic hypoenhancing lesions with the largest measuring 1.9 x 2.0 cm, previously 1.7 x 1.7 cm; 9 x 10 mm hypoenhancing lesion in the inferior right lobe of the liver slightly larger compared to the previous CT; 1.6 x 2.4 cm enhancing lesion in the right paracolic gutter increased in size compared to the prior CT.  Cycle 1 gemcitabine/Abraxane 11/04/2017  Cycle 2 gemcitabine/Abraxane 11/25/2017  Cycle 3 gemcitabine/Abraxane 12/08/2017  Cycle 4 gemcitabine 12/23/2017 (Abraxane held secondary to neuropathy)  CTs  abdomen/pelvis 01/12/2018- decrease in size of the pancreatic mass and hepatic metastasis; slight interval decrease in size  of right paracolic gutter implant; worsening abdominal/pelvic ascites. Bowel wall thickening likely due to ascites and low albumin. 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 Pain secondary to #1.Improved.  3. Weight losssecondary to #1.  4. Microcytic anemia  Transfused with packed red blood cells 07/25/2017, 16 2019, and 11/27/2017  5. History of thrombocytopenia secondary to chemotherapy  6. Port-A-Cath placement 06/04/2017  7. Elevated liver enzymes/bilirubin10/22/2018-likely early biliary obstruction secondary to pancreas cancer, status post ERCP-placement of a common bile duct stent 06/09/2018  8.Postprandial abdominal pain. Protonix initiated 07/16/2017.Trial of Reglan initiated 08/24/2017.  9.Admission with fever 07/24/2017-no source for infection identified, resolved   10.Diarrheafollowing cycle 3 FOLFIRINOX-resolved  11.Left hydronephrosis noted on CT12/03/2017-etiology unclear, urology consulted-stent not recommended.Mild left hydronephrosis noted on CT 10/25/2017. Creatinine normal 10/27/2017.  12. Admission 08/31/2017 with a high fever, blood culture positive for gram-negative rods-Klebsiella pneumoniae identified  13.Fever 10/25/2017 status post evaluation in the emergency department. CT with findings concerning for enterocolitis.   14. Admission 11/11/2017 with a high fever-blood cultures (peripheral and Port-A-Cath) positive for gram-negative  rods--STENOTROPHOMONAS MALTOPHILIA;course of Septra completed as an outpatient.  15. Rash-potentially secondary to gemcitabine or infection, improved  16. Pancytopenia secondary to chemotherapy  17.Intermittent fever. Question "tumor fever", question infection. Now on Bactrim prophylaxis, improved  18. Ascites.Paracentesis 01/14/2018- reactive mesothelial cells, mixed inflammation;culture positive for Klebsiella and enterococcustreated withAugmentin  Admission with fever 01/30/2018, blood cultures negative, discharged on ciprofloxacin/Flagyl  Recurrent ascites, paracentesis 02/23/2018-culture positive for strep viridans, placed on Levaquin    Disposition: Ms.Crystal Carlson has metastatic pancreas cancer.  She is currently being treated with gemcitabine/Abraxane.  There has been no evidence of disease progression on this regimen.  The plan is to continue gemcitabine/Abraxane.  We will follow-up on the CA 19-9 from today. We discussed the potential etiologies for the ascites including peritonitis, portal hypertension, and carcinomatosis.  I think it will be difficult to eradicate the peritonitis as this is most likely secondary to seeding from tumor involving the gastrointestinal tract.  She will continue Levaquin.  We we will refer her for a repeat paracentesis today.  The ascitic fluid will be sent for culture, cell count, and cytology.  She will continue the current narcotic pain regimen.  She will return for an office visit in 2 weeks. 25 minutes were spent with the patient today.  The majority of the time was used for counseling and coordination of care. Betsy Coder, MD  03/03/2018  2:43 PM

## 2018-03-03 NOTE — Patient Instructions (Signed)
[facility] Discharge Instructions for Patients Receiving Chemotherapy  Today you received the following chemotherapy agents: Abraxane and Gemzar   To help prevent nausea and vomiting after your treatment, we encourage you to take your nausea medication as directed.    If you develop nausea and vomiting that is not controlled by your nausea medication, call the clinic.   BELOW ARE SYMPTOMS THAT SHOULD BE REPORTED IMMEDIATELY:  *FEVER GREATER THAN 100.5 F  *CHILLS WITH OR WITHOUT FEVER  NAUSEA AND VOMITING THAT IS NOT CONTROLLED WITH YOUR NAUSEA MEDICATION  *UNUSUAL SHORTNESS OF BREATH  *UNUSUAL BRUISING OR BLEEDING  TENDERNESS IN MOUTH AND THROAT WITH OR WITHOUT PRESENCE OF ULCERS  *URINARY PROBLEMS  *BOWEL PROBLEMS  UNUSUAL RASH Items with * indicate a potential emergency and should be followed up as soon as possible.  Feel free to call the clinic should you have any questions or concerns. The clinic phone number is [phone].  Please show the CHEMO ALERT CARD at check-in to the Emergency Department and triage nurse.   

## 2018-03-03 NOTE — Progress Notes (Signed)
Brief nutrition follow-up completed with patient during infusion for metastatic pancreas cancer. Weight today was documented as 123 pounds. Patient continues to have paracentesis removing fluid thereby making weights an inaccurate measure of lean body mass. Patient denies other nutrition impact symptoms. She is drinking boost, high-protein at home.  Nutrition diagnosis: Inadequate oral intake continues.  Intervention: Educated patient to increase boost plus or equivalent twice daily between meals. Provided coupons for boost plus. Questions were answered teach back method used.  Monitoring, evaluation, goals: Patient will tolerate adequate calories and protein to maintain lean body mass.  Next visit: No follow-up is scheduled at this time.  **Disclaimer: This note was dictated with voice recognition software. Similar sounding words can inadvertently be transcribed and this note may contain transcription errors which may not have been corrected upon publication of note.**

## 2018-03-03 NOTE — Telephone Encounter (Signed)
Scheduled appt per 7/17 los -pt is aware of appt date and time.

## 2018-03-04 LAB — CANCER ANTIGEN 19-9: CAN 19-9: 588 U/mL — AB (ref 0–35)

## 2018-03-08 LAB — CULTURE, BODY FLUID-BOTTLE: CULTURE: NO GROWTH

## 2018-03-08 LAB — CULTURE, BODY FLUID W GRAM STAIN -BOTTLE

## 2018-03-14 ENCOUNTER — Other Ambulatory Visit: Payer: Self-pay | Admitting: Oncology

## 2018-03-17 ENCOUNTER — Inpatient Hospital Stay: Payer: Medicaid Other

## 2018-03-17 ENCOUNTER — Inpatient Hospital Stay (HOSPITAL_BASED_OUTPATIENT_CLINIC_OR_DEPARTMENT_OTHER): Payer: Medicaid Other | Admitting: Nurse Practitioner

## 2018-03-17 ENCOUNTER — Telehealth: Payer: Self-pay | Admitting: Oncology

## 2018-03-17 ENCOUNTER — Encounter: Payer: Self-pay | Admitting: Nurse Practitioner

## 2018-03-17 ENCOUNTER — Ambulatory Visit (HOSPITAL_COMMUNITY)
Admission: RE | Admit: 2018-03-17 | Discharge: 2018-03-17 | Disposition: A | Discharge status: Home / Self Care | Payer: Medicaid Other | Source: Ambulatory Visit | Attending: Nurse Practitioner | Admitting: Nurse Practitioner

## 2018-03-17 VITALS — BP 120/84 | HR 84 | Temp 98.3°F | Resp 18 | Ht 65.0 in | Wt 123.4 lb

## 2018-03-17 DIAGNOSIS — R509 Fever, unspecified: Secondary | ICD-10-CM

## 2018-03-17 DIAGNOSIS — R21 Rash and other nonspecific skin eruption: Secondary | ICD-10-CM

## 2018-03-17 DIAGNOSIS — C787 Secondary malignant neoplasm of liver and intrahepatic bile duct: Secondary | ICD-10-CM

## 2018-03-17 DIAGNOSIS — R188 Other ascites: Secondary | ICD-10-CM | POA: Diagnosis not present

## 2018-03-17 DIAGNOSIS — D649 Anemia, unspecified: Secondary | ICD-10-CM

## 2018-03-17 DIAGNOSIS — C257 Malignant neoplasm of other parts of pancreas: Secondary | ICD-10-CM | POA: Diagnosis not present

## 2018-03-17 DIAGNOSIS — G893 Neoplasm related pain (acute) (chronic): Secondary | ICD-10-CM

## 2018-03-17 DIAGNOSIS — C259 Malignant neoplasm of pancreas, unspecified: Secondary | ICD-10-CM

## 2018-03-17 DIAGNOSIS — Z5111 Encounter for antineoplastic chemotherapy: Secondary | ICD-10-CM | POA: Diagnosis not present

## 2018-03-17 DIAGNOSIS — R140 Abdominal distension (gaseous): Secondary | ICD-10-CM

## 2018-03-17 DIAGNOSIS — D61810 Antineoplastic chemotherapy induced pancytopenia: Secondary | ICD-10-CM | POA: Diagnosis not present

## 2018-03-17 DIAGNOSIS — R634 Abnormal weight loss: Secondary | ICD-10-CM

## 2018-03-17 LAB — CBC WITH DIFFERENTIAL (CANCER CENTER ONLY)
BASOS PCT: 1 %
Basophils Absolute: 0 10*3/uL (ref 0.0–0.1)
EOS ABS: 0 10*3/uL (ref 0.0–0.5)
Eosinophils Relative: 2 %
HCT: 28.8 % — ABNORMAL LOW (ref 34.8–46.6)
HEMOGLOBIN: 9.1 g/dL — AB (ref 11.6–15.9)
Lymphocytes Relative: 27 %
Lymphs Abs: 0.7 10*3/uL — ABNORMAL LOW (ref 0.9–3.3)
MCH: 26.8 pg (ref 25.1–34.0)
MCHC: 31.6 g/dL (ref 31.5–36.0)
MCV: 85.1 fL (ref 79.5–101.0)
Monocytes Absolute: 0.5 10*3/uL (ref 0.1–0.9)
Monocytes Relative: 21 %
NEUTROS ABS: 1.3 10*3/uL — AB (ref 1.5–6.5)
NEUTROS PCT: 49 %
Platelet Count: 97 10*3/uL — ABNORMAL LOW (ref 145–400)
RBC: 3.38 MIL/uL — AB (ref 3.70–5.45)
RDW: 20.7 % — ABNORMAL HIGH (ref 11.2–14.5)
WBC: 2.6 10*3/uL — AB (ref 3.9–10.3)

## 2018-03-17 LAB — CMP (CANCER CENTER ONLY)
ALK PHOS: 316 U/L — AB (ref 38–126)
ALT: 25 U/L (ref 0–44)
ANION GAP: 6 (ref 5–15)
AST: 35 U/L (ref 15–41)
Albumin: 2.5 g/dL — ABNORMAL LOW (ref 3.5–5.0)
BUN: 13 mg/dL (ref 6–20)
CALCIUM: 9 mg/dL (ref 8.9–10.3)
CHLORIDE: 105 mmol/L (ref 98–111)
CO2: 28 mmol/L (ref 22–32)
CREATININE: 0.72 mg/dL (ref 0.44–1.00)
Glucose, Bld: 126 mg/dL — ABNORMAL HIGH (ref 70–99)
Potassium: 4 mmol/L (ref 3.5–5.1)
Sodium: 139 mmol/L (ref 135–145)
Total Bilirubin: 0.9 mg/dL (ref 0.3–1.2)
Total Protein: 7.4 g/dL (ref 6.5–8.1)

## 2018-03-17 MED ORDER — PREDNISONE 5 MG PO TABS: 10.0000 mg | ORAL_TABLET | Freq: Every day | ORAL | 0 refills | Status: DC

## 2018-03-17 MED ORDER — LIDOCAINE HCL 1 % IJ SOLN
INTRAMUSCULAR | Status: AC
  Filled 2018-03-17: qty 20

## 2018-03-17 MED FILL — predniSONE 5 MG TABS: 5 | 30 days supply | Qty: 60 | Fill #0

## 2018-03-17 NOTE — Progress Notes (Addendum)
[facility] OFFICE PROGRESS NOTE   Diagnosis: Pancreas cancer  INTERVAL HISTORY:   Crystal Carlson returns as scheduled.  She completed another cycle of gemcitabine/Abraxane on 03/03/2018.  She denies nausea/vomiting.  No mouth sores.  No diarrhea.  Stable to improved numbness/tingling in the fingertips and toes.  She describes her pain as "okay".  She reports a good appetite.  No fever.  She notes recurrent abdominal distention and would like to have a paracentesis.  Objective:  Vital signs in last 24 hours:  Blood pressure 120/84, pulse 84, temperature 98.3 F (36.8 C), temperature source Oral, resp. rate 18, height 5\' 5"  (1.651 m), weight 123 lb 6.4 oz (56 kg), SpO2 100 %.    HEENT: No thrush or ulcers.  Tongue appears dry. Resp: Lungs clear bilaterally. Cardio: Regular rate and rhythm. GI: Abdomen is distended consistent with ascites. Vascular: Edema at the lower legs/ankles bilaterally. Neuro: Alert and oriented. Skin: No rash. Port-A-Cath without erythema.   Lab Results:  Lab Results  Component Value Date   WBC 2.6 (L) 03/17/2018   HGB 9.1 (L) 03/17/2018   HCT 28.8 (L) 03/17/2018   MCV 85.1 03/17/2018   PLT 97 (L) 03/17/2018   NEUTROABS 1.3 (L) 03/17/2018    Imaging:  No results found.  Medications: I have reviewed the patient's current medications.  Assessment/Plan: 1. 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 Adenocarcinoma consistent with pancreatobiliary primary.  Cycle 1 FOLFIRINOX 06/18/2017  Cycle  2FOLFIRINOX 07/02/2017  Cycle 3 FOLFIRINOX 07/16/2017 (oxaliplatin dose reduced due to mild thrombocytopenia)  CT 07/24/2017-new splenomegaly and left hydronephrosis  Cycle 4 FOLFIRINOX 08/08/2017  Cycle 5 FOLFIRINOX 08/24/2017  CT abdomen/pelvis 09/03/2017-stable liver lesions and pancreas mass, stable right biliary dilatation, possible new 8 mm right liver lesion versus a vascular phenomena, lesion at the right paracolic gutter was present in the past-significance unclear  Cycle 6 FOLFIRINOX1/29/2019  Cycle7 FOLFIRINOX2/19/2019 (Irinotecan andOxaliplatin dose reduced due to thrombocytopenia)  CT abdomen/pelvis 10/25/2017- findings concerning for enterocolitis. No bowel obstruction. Ill-defined pancreatic head mass. Multiple hepatic hypoenhancing lesions with the largest measuring 1.9 x 2.0 cm, previously 1.7 x 1.7 cm; 9 x 10 mm hypoenhancing lesion in the inferior right lobe of the liver slightly larger compared to the previous CT; 1.6 x 2.4 cm enhancing lesion in the right paracolic gutter increased in size compared to the prior CT.  Cycle 1 gemcitabine/Abraxane 11/04/2017  Cycle 2 gemcitabine/Abraxane 11/25/2017  Cycle 3 gemcitabine/Abraxane 12/08/2017  Cycle 4 gemcitabine 12/23/2017 (Abraxane held secondary to neuropathy)  CTs abdomen/pelvis 01/12/2018- decrease in size of the pancreatic mass and hepatic metastasis; slight interval decrease in size of right paracolic gutter implant; worsening abdominal/pelvic ascites. Bowel wall thickening likely due to ascites and low albumin. 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 Pain secondary to #1.Improved.  3. Weight losssecondary to #1.  4. Microcytic anemia  Transfused with packed red blood cells 07/25/2017, 16 2019, and 11/27/2017  5. History of thrombocytopenia secondary to chemotherapy  6. Port-A-Cath placement 06/04/2017  7. Elevated liver enzymes/bilirubin10/22/2018-likely early biliary obstruction secondary to pancreas cancer, status post ERCP-placement of a common bile duct stent 06/09/2018  8.Postprandial abdominal pain. Protonix initiated 07/16/2017.Trial of Reglan initiated 08/24/2017.  9.Admission with fever 07/24/2017-no source for infection identified, resolved   10.Diarrheafollowing cycle 3 FOLFIRINOX-resolved  11.Left hydronephrosis noted on CT12/03/2017-etiology unclear, urology consulted-stent not recommended.Mild left hydronephrosis noted on CT 10/25/2017. Creatinine normal 10/27/2017.  12. Admission 08/31/2017 with a high fever, blood culture positive for gram-negative rods-Klebsiella pneumoniae identified  13.Fever 10/25/2017 status post evaluation in the emergency department. CT with findings concerning for enterocolitis.   14. Admission 11/11/2017 with a high fever-blood cultures (peripheral and Port-A-Cath) positive for gram-negative rods--STENOTROPHOMONAS MALTOPHILIA;course of Septra completed as an outpatient.  15. Rash-potentially secondary to gemcitabine or infection, improved  16. Pancytopenia secondary to chemotherapy  17.Intermittent fever. Question "tumor fever", question infection. Now on Bactrim prophylaxis, improved  18. Ascites.Paracentesis 01/14/2018- reactive mesothelial cells, mixed inflammation;culture positive for Klebsiella and enterococcustreated withAugmentin  Admission with fever 01/30/2018, blood cultures negative, discharged on  ciprofloxacin/Flagyl  Recurrent ascites, paracentesis 02/23/2018-culture positive for strep viridans, placed on Levaquin  Paracentesis 03/03/2018- 2 L of fluid removed, culture and cytology negative    Disposition: Crystal Carlson appears stable.  She has completed 8 cycles of gemcitabine/Abraxane.  Most recent CA-19-9 tumor marker was further improved.  CBC from today shows mild neutropenia and thrombocytopenia.  We decided to hold today's treatment and reschedule for 1 week.  Chemotherapy schedule will be adjusted to every 3 weeks thereafter.  We reviewed neutropenic and thrombocytopenic precautions.  She understands to contact the office with fever, chills, other signs of infection, bleeding.  She has recurrent abdominal distention.  Exam is consistent with ascites.  We are referring her for a paracentesis with fluid to be sent for culture and cytology.  She will return for lab and the next cycle of gemcitabine/Abraxane on 03/24/2018.  We will see her in follow-up on 04/14/2018.  She will contact the office in the interim as outlined above or with any other problems.  Patient seen with Dr. Benay Spice.  25 minutes were spent face-to-face at today's visit with the majority of that time involved in counseling/coordination of care.    [name] ANP/GNP-BC   03/17/2018  11:03 AM  This was a shared visit with [name].  Crystal Carlson appears unchanged.  There is no clinical evidence of progressive pancreas cancer.  The plan is to continue gemcitabine/Abraxane when the neutrophil count has recovered.  We will change the chemotherapy to an every 3-week schedule.  She will undergo a repeat paracentesis.  [name], MD

## 2018-03-17 NOTE — Telephone Encounter (Signed)
Appointments scheduled AVS/Calendar printed per 7/31 los °

## 2018-03-17 NOTE — Procedures (Signed)
Ultrasound-guided diagnostic and therapeutic paracentesis performed yielding 2.4 liters of light yellow fluid. No immediate complications.  A portion of the fluid was submitted to the lab for preordered studies.

## 2018-03-18 LAB — CANCER ANTIGEN 19-9: CA 19-9: 9 U/mL (ref 0–35)

## 2018-03-21 LAB — BODY FLUID CULTURE: CULTURE: NO GROWTH

## 2018-03-24 ENCOUNTER — Other Ambulatory Visit: Payer: Medicaid Other

## 2018-03-25 ENCOUNTER — Inpatient Hospital Stay: Payer: Medicaid Other

## 2018-03-25 ENCOUNTER — Inpatient Hospital Stay: Payer: Medicaid Other | Attending: Nurse Practitioner

## 2018-03-25 VITALS — BP 129/83 | HR 74 | Temp 98.8°F | Resp 18 | Wt 128.0 lb

## 2018-03-25 DIAGNOSIS — C787 Secondary malignant neoplasm of liver and intrahepatic bile duct: Secondary | ICD-10-CM | POA: Diagnosis not present

## 2018-03-25 DIAGNOSIS — R188 Other ascites: Secondary | ICD-10-CM | POA: Insufficient documentation

## 2018-03-25 DIAGNOSIS — G893 Neoplasm related pain (acute) (chronic): Secondary | ICD-10-CM | POA: Diagnosis not present

## 2018-03-25 DIAGNOSIS — C259 Malignant neoplasm of pancreas, unspecified: Secondary | ICD-10-CM

## 2018-03-25 DIAGNOSIS — Z5111 Encounter for antineoplastic chemotherapy: Secondary | ICD-10-CM | POA: Insufficient documentation

## 2018-03-25 DIAGNOSIS — C257 Malignant neoplasm of other parts of pancreas: Secondary | ICD-10-CM | POA: Diagnosis not present

## 2018-03-25 LAB — CBC WITH DIFFERENTIAL (CANCER CENTER ONLY)
Basophils Absolute: 0 10*3/uL (ref 0.0–0.1)
Basophils Relative: 0 %
EOS ABS: 0 10*3/uL (ref 0.0–0.5)
EOS PCT: 0 %
HCT: 30.8 % — ABNORMAL LOW (ref 34.8–46.6)
Hemoglobin: 9.4 g/dL — ABNORMAL LOW (ref 11.6–15.9)
LYMPHS PCT: 11 %
Lymphs Abs: 0.8 10*3/uL — ABNORMAL LOW (ref 0.9–3.3)
MCH: 26.6 pg (ref 25.1–34.0)
MCHC: 30.5 g/dL — AB (ref 31.5–36.0)
MCV: 87 fL (ref 79.5–101.0)
MONO ABS: 0.7 10*3/uL (ref 0.1–0.9)
MONOS PCT: 10 %
Neutro Abs: 5.5 10*3/uL (ref 1.5–6.5)
Neutrophils Relative %: 79 %
PLATELETS: 139 10*3/uL — AB (ref 145–400)
RBC: 3.54 MIL/uL — AB (ref 3.70–5.45)
RDW: 19.5 % — ABNORMAL HIGH (ref 11.2–14.5)
WBC: 7.1 10*3/uL (ref 3.9–10.3)

## 2018-03-25 LAB — CMP (CANCER CENTER ONLY)
ALT: 32 U/L (ref 0–44)
ANION GAP: 9 (ref 5–15)
AST: 53 U/L — ABNORMAL HIGH (ref 15–41)
Albumin: 2.3 g/dL — ABNORMAL LOW (ref 3.5–5.0)
Alkaline Phosphatase: 328 U/L — ABNORMAL HIGH (ref 38–126)
BUN: 13 mg/dL (ref 6–20)
CALCIUM: 8.5 mg/dL — AB (ref 8.9–10.3)
CHLORIDE: 106 mmol/L (ref 98–111)
CO2: 24 mmol/L (ref 22–32)
Creatinine: 0.77 mg/dL (ref 0.44–1.00)
Glucose, Bld: 138 mg/dL — ABNORMAL HIGH (ref 70–99)
Potassium: 4 mmol/L (ref 3.5–5.1)
SODIUM: 139 mmol/L (ref 135–145)
Total Bilirubin: 1 mg/dL (ref 0.3–1.2)
Total Protein: 7.1 g/dL (ref 6.5–8.1)

## 2018-03-25 MED ORDER — PACLITAXEL PROTEIN-BOUND CHEMO INJECTION 100 MG
80.0000 mg/m2 | Freq: Once | INTRAVENOUS | Status: AC
  Administered 2018-03-25: 125 mg via INTRAVENOUS
  Filled 2018-03-25: qty 25

## 2018-03-25 MED ORDER — HEPARIN SOD (PORK) LOCK FLUSH 100 UNIT/ML IV SOLN
500.0000 [IU] | Freq: Once | INTRAVENOUS | Status: AC | PRN
  Administered 2018-03-25: 500 [IU]
  Filled 2018-03-25: qty 5

## 2018-03-25 MED ORDER — SODIUM CHLORIDE 0.9 % IV SOLN
700.0000 mg/m2 | Freq: Once | INTRAVENOUS | Status: AC
  Administered 2018-03-25: 1102 mg via INTRAVENOUS
  Filled 2018-03-25: qty 28.98

## 2018-03-25 MED ORDER — PROCHLORPERAZINE MALEATE 10 MG PO TABS
ORAL_TABLET | ORAL | Status: AC
  Filled 2018-03-25: qty 1

## 2018-03-25 MED ORDER — PROCHLORPERAZINE MALEATE 10 MG PO TABS
10.0000 mg | ORAL_TABLET | Freq: Once | ORAL | Status: AC
  Administered 2018-03-25: 10 mg via ORAL

## 2018-03-25 MED ORDER — SODIUM CHLORIDE 0.9 % IV SOLN
Freq: Once | INTRAVENOUS | Status: AC
  Administered 2018-03-25: 10:00:00 via INTRAVENOUS
  Filled 2018-03-25: qty 250

## 2018-03-25 MED ORDER — SODIUM CHLORIDE 0.9% FLUSH
10.0000 mL | INTRAVENOUS | Status: DC | PRN
  Administered 2018-03-25: 10 mL
  Filled 2018-03-25: qty 10

## 2018-03-25 MED ORDER — PACLITAXEL PROTEIN-BOUND CHEMO INJECTION 100 MG: 80.0000 mg/m2 | Freq: Once | Status: DC

## 2018-03-25 NOTE — Patient Instructions (Signed)
[facility] Discharge Instructions for Patients Receiving Chemotherapy  Today you received the following chemotherapy agents: Abraxane, Gemzar   To help prevent nausea and vomiting after your treatment, we encourage you to take your nausea medication as directed.    If you develop nausea and vomiting that is not controlled by your nausea medication, call the clinic.   BELOW ARE SYMPTOMS THAT SHOULD BE REPORTED IMMEDIATELY:  *FEVER GREATER THAN 100.5 F  *CHILLS WITH OR WITHOUT FEVER  NAUSEA AND VOMITING THAT IS NOT CONTROLLED WITH YOUR NAUSEA MEDICATION  *UNUSUAL SHORTNESS OF BREATH  *UNUSUAL BRUISING OR BLEEDING  TENDERNESS IN MOUTH AND THROAT WITH OR WITHOUT PRESENCE OF ULCERS  *URINARY PROBLEMS  *BOWEL PROBLEMS  UNUSUAL RASH Items with * indicate a potential emergency and should be followed up as soon as possible.  Feel free to call the clinic should you have any questions or concerns. The clinic phone number is [phone].  Please show the CHEMO ALERT CARD at check-in to the Emergency Department and triage nurse.   

## 2018-03-26 LAB — CANCER ANTIGEN 19-9: CA 19-9: 293 U/mL — ABNORMAL HIGH (ref 0–35)

## 2018-03-30 ENCOUNTER — Telehealth: Payer: Self-pay

## 2018-03-30 DIAGNOSIS — C787 Secondary malignant neoplasm of liver and intrahepatic bile duct: Principal | ICD-10-CM

## 2018-03-30 DIAGNOSIS — C259 Malignant neoplasm of pancreas, unspecified: Secondary | ICD-10-CM

## 2018-03-30 NOTE — Telephone Encounter (Signed)
Received call from pt husband reporting that pt Is experiencing abdominal swelling and needs paracentesis. Ok per Dr. Benay Spice, paracentesis scheduled for [facility] tomorrow at 1:30. Pt aware to arrive at 1:15pm

## 2018-03-31 ENCOUNTER — Ambulatory Visit (HOSPITAL_COMMUNITY)
Admission: RE | Admit: 2018-03-31 | Discharge: 2018-03-31 | Disposition: A | Discharge status: Home / Self Care | Payer: Medicaid Other | Source: Ambulatory Visit | Attending: Oncology | Admitting: Oncology

## 2018-03-31 DIAGNOSIS — C787 Secondary malignant neoplasm of liver and intrahepatic bile duct: Secondary | ICD-10-CM | POA: Insufficient documentation

## 2018-03-31 DIAGNOSIS — C259 Malignant neoplasm of pancreas, unspecified: Secondary | ICD-10-CM | POA: Diagnosis not present

## 2018-03-31 MED ORDER — LIDOCAINE HCL 1 % IJ SOLN
INTRAMUSCULAR | Status: DC
Start: 2018-03-31 — End: 2018-04-01
  Filled 2018-03-31: qty 10

## 2018-03-31 NOTE — Procedures (Signed)
PROCEDURE SUMMARY:  Successful US guided paracentesis from left lateral abdomen.  Yielded 3.0 of clear, yellow fluid.  No immediate complications.  Pt tolerated well.   Specimen was not sent for labs.  Docia Barrier PA-C 03/31/2018 2:28 PM

## 2018-04-06 ENCOUNTER — Other Ambulatory Visit: Payer: Self-pay

## 2018-04-06 DIAGNOSIS — C259 Malignant neoplasm of pancreas, unspecified: Secondary | ICD-10-CM

## 2018-04-06 DIAGNOSIS — C787 Secondary malignant neoplasm of liver and intrahepatic bile duct: Principal | ICD-10-CM

## 2018-04-06 MED ORDER — PREDNISONE 5 MG PO TABS: 10.0000 mg | ORAL_TABLET | Freq: Every day | ORAL | 0 refills | Status: DC

## 2018-04-06 MED ORDER — LEVOFLOXACIN 500 MG PO TABS: 500.0000 mg | ORAL_TABLET | Freq: Every day | ORAL | 0 refills | Status: DC

## 2018-04-09 ENCOUNTER — Telehealth: Payer: Self-pay

## 2018-04-09 DIAGNOSIS — C787 Secondary malignant neoplasm of liver and intrahepatic bile duct: Principal | ICD-10-CM

## 2018-04-09 DIAGNOSIS — C259 Malignant neoplasm of pancreas, unspecified: Secondary | ICD-10-CM

## 2018-04-09 NOTE — Telephone Encounter (Signed)
Spoke with pt spouse. Spouse reported that pt had abdominal heaviness and back pain related to heaviness. Pt spouse requesting paracentesis. Ok per Dr. Benay Spice. Informed of scheduled paracentesis and provided number to central scheduling for pt to call to inquire of any cancellations or earlier openings on Monday

## 2018-04-13 ENCOUNTER — Ambulatory Visit (HOSPITAL_COMMUNITY)
Admission: RE | Admit: 2018-04-13 | Discharge: 2018-04-13 | Disposition: A | Discharge status: Home / Self Care | Payer: Medicaid Other | Source: Ambulatory Visit | Attending: Oncology | Admitting: Oncology

## 2018-04-13 ENCOUNTER — Encounter (HOSPITAL_COMMUNITY): Payer: Self-pay | Admitting: Student

## 2018-04-13 DIAGNOSIS — C259 Malignant neoplasm of pancreas, unspecified: Secondary | ICD-10-CM

## 2018-04-13 DIAGNOSIS — Z8507 Personal history of malignant neoplasm of pancreas: Secondary | ICD-10-CM | POA: Insufficient documentation

## 2018-04-13 DIAGNOSIS — R188 Other ascites: Secondary | ICD-10-CM | POA: Insufficient documentation

## 2018-04-13 DIAGNOSIS — C787 Secondary malignant neoplasm of liver and intrahepatic bile duct: Secondary | ICD-10-CM

## 2018-04-13 HISTORY — PX: IR PARACENTESIS: IMG2679

## 2018-04-13 MED ORDER — LIDOCAINE HCL (PF) 2 % IJ SOLN
INTRAMUSCULAR | Status: AC
  Filled 2018-04-13: qty 20

## 2018-04-13 MED ORDER — LIDOCAINE HCL (PF) 2 % IJ SOLN
INTRAMUSCULAR | Status: DC | PRN
  Administered 2018-04-13: 10 mL

## 2018-04-13 NOTE — Procedures (Signed)
PROCEDURE SUMMARY:  Successful US guided paracentesis from right lateral abdomen.  Yielded 3.8 liters of clear, yellow fluid.  No immediate complications.  Pt tolerated well.   Specimen was not sent for labs.  Docia Barrier PA-C 04/13/2018 10:33 AM

## 2018-04-14 ENCOUNTER — Inpatient Hospital Stay (HOSPITAL_BASED_OUTPATIENT_CLINIC_OR_DEPARTMENT_OTHER): Payer: Medicaid Other | Admitting: Oncology

## 2018-04-14 ENCOUNTER — Inpatient Hospital Stay: Payer: Medicaid Other

## 2018-04-14 ENCOUNTER — Telehealth: Payer: Self-pay | Admitting: Oncology

## 2018-04-14 DIAGNOSIS — C257 Malignant neoplasm of other parts of pancreas: Secondary | ICD-10-CM | POA: Diagnosis not present

## 2018-04-14 DIAGNOSIS — R188 Other ascites: Secondary | ICD-10-CM

## 2018-04-14 DIAGNOSIS — Z5111 Encounter for antineoplastic chemotherapy: Secondary | ICD-10-CM | POA: Diagnosis not present

## 2018-04-14 DIAGNOSIS — C787 Secondary malignant neoplasm of liver and intrahepatic bile duct: Principal | ICD-10-CM

## 2018-04-14 DIAGNOSIS — C259 Malignant neoplasm of pancreas, unspecified: Secondary | ICD-10-CM

## 2018-04-14 DIAGNOSIS — G893 Neoplasm related pain (acute) (chronic): Secondary | ICD-10-CM

## 2018-04-14 LAB — CMP (CANCER CENTER ONLY)
ALT: 22 U/L (ref 0–44)
AST: 39 U/L (ref 15–41)
Albumin: 2.3 g/dL — ABNORMAL LOW (ref 3.5–5.0)
Alkaline Phosphatase: 315 U/L — ABNORMAL HIGH (ref 38–126)
Anion gap: 8 (ref 5–15)
BUN: 12 mg/dL (ref 6–20)
CHLORIDE: 104 mmol/L (ref 98–111)
CO2: 26 mmol/L (ref 22–32)
CREATININE: 0.76 mg/dL (ref 0.44–1.00)
Calcium: 8.8 mg/dL — ABNORMAL LOW (ref 8.9–10.3)
Glucose, Bld: 139 mg/dL — ABNORMAL HIGH (ref 70–99)
POTASSIUM: 3.5 mmol/L (ref 3.5–5.1)
SODIUM: 138 mmol/L (ref 135–145)
Total Bilirubin: 1 mg/dL (ref 0.3–1.2)
Total Protein: 7.4 g/dL (ref 6.5–8.1)

## 2018-04-14 LAB — CBC WITH DIFFERENTIAL (CANCER CENTER ONLY)
BASOS ABS: 0.1 10*3/uL (ref 0.0–0.1)
BASOS PCT: 1 %
EOS ABS: 0 10*3/uL (ref 0.0–0.5)
EOS PCT: 0 %
HCT: 31.9 % — ABNORMAL LOW (ref 34.8–46.6)
Hemoglobin: 10.1 g/dL — ABNORMAL LOW (ref 11.6–15.9)
Lymphocytes Relative: 8 %
Lymphs Abs: 0.6 10*3/uL — ABNORMAL LOW (ref 0.9–3.3)
MCH: 25.6 pg (ref 25.1–34.0)
MCHC: 31.7 g/dL (ref 31.5–36.0)
MCV: 80.9 fL (ref 79.5–101.0)
MONO ABS: 0.7 10*3/uL (ref 0.1–0.9)
Monocytes Relative: 10 %
Neutro Abs: 6 10*3/uL (ref 1.5–6.5)
Neutrophils Relative %: 81 %
PLATELETS: 160 10*3/uL (ref 145–400)
RBC: 3.94 MIL/uL (ref 3.70–5.45)
RDW: 20.6 % — AB (ref 11.2–14.5)
WBC: 7.4 10*3/uL (ref 3.9–10.3)

## 2018-04-14 MED ORDER — GEMCITABINE HCL CHEMO INJECTION 1 GM/26.3ML
700.0000 mg/m2 | Freq: Once | INTRAVENOUS | Status: AC
  Administered 2018-04-14: 1102 mg via INTRAVENOUS
  Filled 2018-04-14: qty 28.98

## 2018-04-14 MED ORDER — PROCHLORPERAZINE MALEATE 10 MG PO TABS
10.0000 mg | ORAL_TABLET | Freq: Once | ORAL | Status: AC
  Administered 2018-04-14: 10 mg via ORAL

## 2018-04-14 MED ORDER — PROCHLORPERAZINE MALEATE 10 MG PO TABS
ORAL_TABLET | ORAL | Status: AC
  Filled 2018-04-14: qty 1

## 2018-04-14 MED ORDER — MORPHINE SULFATE ER 15 MG PO TBCR: 15.0000 mg | EXTENDED_RELEASE_TABLET | Freq: Two times a day (BID) | ORAL | 0 refills | Status: DC

## 2018-04-14 MED ORDER — SODIUM CHLORIDE 0.9 % IV SOLN
Freq: Once | INTRAVENOUS | Status: AC
  Administered 2018-04-14: 14:00:00 via INTRAVENOUS
  Filled 2018-04-14: qty 250

## 2018-04-14 MED ORDER — SODIUM CHLORIDE 0.9% FLUSH
10.0000 mL | INTRAVENOUS | Status: DC | PRN
  Administered 2018-04-14: 10 mL
  Filled 2018-04-14: qty 10

## 2018-04-14 MED ORDER — PACLITAXEL PROTEIN-BOUND CHEMO INJECTION 100 MG: 80.0000 mg/m2 | Freq: Once | Status: DC

## 2018-04-14 MED ORDER — HEPARIN SOD (PORK) LOCK FLUSH 100 UNIT/ML IV SOLN
500.0000 [IU] | Freq: Once | INTRAVENOUS | Status: AC | PRN
  Administered 2018-04-14: 500 [IU]
  Filled 2018-04-14: qty 5

## 2018-04-14 MED ORDER — LORAZEPAM 0.5 MG PO TABS: 0.5000 mg | ORAL_TABLET | Freq: Three times a day (TID) | ORAL | 0 refills | Status: DC | PRN

## 2018-04-14 MED ORDER — PACLITAXEL PROTEIN-BOUND CHEMO INJECTION 100 MG
80.0000 mg/m2 | Freq: Once | INTRAVENOUS | Status: AC
  Administered 2018-04-14: 125 mg via INTRAVENOUS
  Filled 2018-04-14: qty 25

## 2018-04-14 MED FILL — LORazepam 0.5 MG TABS: 0.5 | 10 days supply | Qty: 30 | Fill #0

## 2018-04-14 MED FILL — MORPHINE SULF ER 15 MG TAB: 15 | 30 days supply | Qty: 60 | Fill #0

## 2018-04-14 NOTE — Patient Instructions (Signed)
[facility] Discharge Instructions for Patients Receiving Chemotherapy  Today you received the following chemotherapy agents: Abraxane and Gemzar   To help prevent nausea and vomiting after your treatment, we encourage you to take your nausea medication as directed.    If you develop nausea and vomiting that is not controlled by your nausea medication, call the clinic.   BELOW ARE SYMPTOMS THAT SHOULD BE REPORTED IMMEDIATELY:  *FEVER GREATER THAN 100.5 F  *CHILLS WITH OR WITHOUT FEVER  NAUSEA AND VOMITING THAT IS NOT CONTROLLED WITH YOUR NAUSEA MEDICATION  *UNUSUAL SHORTNESS OF BREATH  *UNUSUAL BRUISING OR BLEEDING  TENDERNESS IN MOUTH AND THROAT WITH OR WITHOUT PRESENCE OF ULCERS  *URINARY PROBLEMS  *BOWEL PROBLEMS  UNUSUAL RASH Items with * indicate a potential emergency and should be followed up as soon as possible.  Feel free to call the clinic should you have any questions or concerns. The clinic phone number is [phone].  Please show the CHEMO ALERT CARD at check-in to the Emergency Department and triage nurse.   

## 2018-04-14 NOTE — Progress Notes (Signed)
[place] OFFICE PROGRESS NOTE   Diagnosis: Pancreas cancer  INTERVAL HISTORY:   Crystal Carlson pleaded another cycle of gemcitabine/Abraxane on 03/25/2018.  No change in neuropathy symptoms.  She reports good pain control MS Contin.  Good appetite.  She believes prednisone has helped the appetite.  She underwent a paracentesis for 3.8 L yesterday.  No fever.  She continues Levaquin.   Objective:  Vital signs in last 24 hours:  Blood pressure 119/88, pulse 96, temperature 98.3 F (36.8 C), temperature source Oral, resp. rate 18, height 5\' 5"  (1.651 m), weight 119 lb 9.6 oz (54.3 kg), SpO2 100 %.    HEENT: No thrush or ulcers Resp: Lungs clear bilaterally Cardio: Regular rate and rhythm GI: The abdomen is soft, mildly distended, no hepatomegaly, nontender, no umbilical mass Vascular: Trace edema at the ankles   Portacath/PICC-without erythema  Lab Results:  Lab Results  Component Value Date   WBC 7.4 04/14/2018   HGB 10.1 (L) 04/14/2018   HCT 31.9 (L) 04/14/2018   MCV 80.9 04/14/2018   PLT 160 04/14/2018   NEUTROABS 6.0 04/14/2018    CMP  Lab Results  Component Value Date   NA 138 04/14/2018   K 3.5 04/14/2018   CL 104 04/14/2018   CO2 26 04/14/2018   GLUCOSE 139 (H) 04/14/2018   BUN 12 04/14/2018   CREATININE 0.76 04/14/2018   CALCIUM 8.8 (L) 04/14/2018   PROT 7.4 04/14/2018   ALBUMIN 2.3 (L) 04/14/2018   AST 39 04/14/2018   ALT 22 04/14/2018   ALKPHOS 315 (H) 04/14/2018   BILITOT 1.0 04/14/2018   GFRNONAA >60 04/14/2018   GFRAA >60 04/14/2018    Lab Results  Component Value Date   CEA1 1.3 05/25/2017     Imaging:  Ir Paracentesis  Result Date: 04/13/2018 INDICATION: Patient with history of pancreatic cancer, recurrent ascites. Request is made for therapeutic paracentesis. EXAM: ULTRASOUND GUIDED THERAPEUTIC PARACENTESIS MEDICATIONS: 10 mL 2% lidocaine COMPLICATIONS: None immediate. PROCEDURE: Informed written consent was obtained from  the patient after a discussion of the risks, benefits and alternatives to treatment. A timeout was performed prior to the initiation of the procedure. Initial ultrasound scanning demonstrates a moderate amount of ascites within the right lower abdominal quadrant. The right lower abdomen was prepped and draped in the usual sterile fashion. 2% lidocaine was used for local anesthesia. Following this, a 19 gauge, 7-cm, Yueh catheter was introduced. An ultrasound image was saved for documentation purposes. The paracentesis was performed. The catheter was removed and a dressing was applied. The patient tolerated the procedure well without immediate post procedural complication. FINDINGS: A total of approximately 3.8 liters of clear, yellow fluid was removed. IMPRESSION: Successful ultrasound-guided therapeutic paracentesis yielding 3.8 liters of peritoneal fluid. Read by: Brynda Greathouse PA-C Electronically Signed   By: Jerilynn Mages.  Shick M.D.   On: 04/13/2018 11:36    Medications: I have reviewed the patient's current medications.   Assessment/Plan: 1. 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 Adenocarcinoma consistent with pancreatobiliary primary.  Cycle 1 FOLFIRINOX 06/18/2017  Cycle 2FOLFIRINOX 07/02/2017  Cycle 3 FOLFIRINOX 07/16/2017 (oxaliplatin dose reduced due to mild thrombocytopenia)  CT  07/24/2017-new splenomegaly and left hydronephrosis  Cycle 4 FOLFIRINOX 08/08/2017  Cycle 5 FOLFIRINOX 08/24/2017  CT abdomen/pelvis 09/03/2017-stable liver lesions and  pancreas mass, stable right biliary dilatation, possible new 8 mm right liver lesion versus a vascular phenomena, lesion at the right paracolic gutter was present in the past-significance unclear  Cycle 6 FOLFIRINOX1/29/2019  Cycle7 FOLFIRINOX2/19/2019 (Irinotecan andOxaliplatin dose reduced due to thrombocytopenia)  CT abdomen/pelvis 10/25/2017- findings concerning for enterocolitis. No bowel obstruction. Ill-defined pancreatic head mass. Multiple hepatic hypoenhancing lesions with the largest measuring 1.9 x 2.0 cm, previously 1.7 x 1.7 cm; 9 x 10 mm hypoenhancing lesion in the inferior right lobe of the liver slightly larger compared to the previous CT; 1.6 x 2.4 cm enhancing lesion in the right paracolic gutter increased in size compared to the prior CT.  Cycle 1 gemcitabine/Abraxane 11/04/2017  Cycle 2 gemcitabine/Abraxane 11/25/2017  Cycle 3 gemcitabine/Abraxane 12/08/2017  Cycle 4 gemcitabine 12/23/2017 (Abraxane held secondary to neuropathy)  CTs abdomen/pelvis 01/12/2018- decrease in size of the pancreatic mass and hepatic metastasis; slight interval decrease in size of right paracolic gutter implant; worsening abdominal/pelvic ascites. Bowel wall thickening likely due to ascites and low albumin. 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 Pain secondary to #1.Improved.  3. Weight losssecondary to #1.  4. Microcytic  anemia  Transfused with packed red blood cells 07/25/2017, 16 2019, and 11/27/2017  5. History of thrombocytopenia secondary to chemotherapy  6. Port-A-Cath placement 06/04/2017  7. Elevated liver enzymes/bilirubin10/22/2018-likely early biliary obstruction secondary to pancreas cancer, status post ERCP-placement of a common bile duct stent 06/09/2018  8.Postprandial abdominal pain. Protonix initiated 07/16/2017.Trial of Reglan initiated 08/24/2017.  9.Admission with fever 07/24/2017-no source for infection identified, resolved   10.Diarrheafollowing cycle 3 FOLFIRINOX-resolved  11.Left hydronephrosis noted on CT12/03/2017-etiology unclear, urology consulted-stent not recommended.Mild left hydronephrosis noted on CT 10/25/2017. Creatinine normal 10/27/2017.  12. Admission 08/31/2017 with a high fever, blood culture positive for gram-negative rods-Klebsiella pneumoniae identified  13.Fever 10/25/2017 status post evaluation in the emergency department. CT with findings concerning for enterocolitis.   14. Admission 11/11/2017 with a high fever-blood cultures (peripheral and Port-A-Cath) positive for gram-negative rods--STENOTROPHOMONAS MALTOPHILIA;course of Septra completed as an outpatient.  15. Rash-potentially secondary to gemcitabine or infection, improved  16. Pancytopenia secondary to chemotherapy  17.Intermittent fever. Question "tumor fever", question infection. Now on Bactrim prophylaxis, improved  18. Ascites.Paracentesis 01/14/2018- reactive mesothelial cells, mixed inflammation;culture positive for Klebsiella and enterococcustreated withAugmentin  Admission with fever 01/30/2018, blood cultures negative, discharged on ciprofloxacin/Flagyl  Recurrent ascites, paracentesis 02/23/2018-culture positive for strep viridans, placed on Levaquin  Paracentesis 03/03/2018- 2 L of fluid removed, culture and cytology  negative     Disposition: Crystal Carlson appears stable.  She is tolerating the chemotherapy well and there is no clinical evidence of disease progression.  The etiology of her ascites remains unclear.  This may be related to the history of peritonitis or portal hypertension.  She will continue therapeutic paracentesis procedures as needed.  She will continue the current chronic regimen.  She will return for office visit and chemotherapy in 3 weeks.  She  will undergo restaging CT prior to the office visit in 3 weeks.  We discussed the prognosis again today.  She understands no therapy will be curative.  The current chemotherapy is palliative.  25 minutes were spent with the patient today.  The majority of the time was used for counseling and coordination of care.  Betsy Coder, MD  04/14/2018  1:16 PM

## 2018-04-14 NOTE — Telephone Encounter (Signed)
Gave pt avs and calendar  °

## 2018-04-15 LAB — CANCER ANTIGEN 19-9: CA 19-9: 326 U/mL — ABNORMAL HIGH (ref 0–35)

## 2018-04-23 ENCOUNTER — Other Ambulatory Visit: Payer: Self-pay

## 2018-04-23 ENCOUNTER — Emergency Department (HOSPITAL_COMMUNITY): Payer: Medicaid Other

## 2018-04-23 ENCOUNTER — Emergency Department (HOSPITAL_COMMUNITY)
Admission: EM | Admit: 2018-04-23 | Discharge: 2018-04-24 | Disposition: A | Discharge status: Home / Self Care | Payer: Medicaid Other | Attending: Emergency Medicine | Admitting: Emergency Medicine

## 2018-04-23 DIAGNOSIS — M5127 Other intervertebral disc displacement, lumbosacral region: Secondary | ICD-10-CM | POA: Insufficient documentation

## 2018-04-23 DIAGNOSIS — Z8507 Personal history of malignant neoplasm of pancreas: Secondary | ICD-10-CM | POA: Diagnosis not present

## 2018-04-23 DIAGNOSIS — M6281 Muscle weakness (generalized): Secondary | ICD-10-CM | POA: Insufficient documentation

## 2018-04-23 DIAGNOSIS — R200 Anesthesia of skin: Secondary | ICD-10-CM | POA: Diagnosis not present

## 2018-04-23 DIAGNOSIS — M79605 Pain in left leg: Secondary | ICD-10-CM | POA: Diagnosis present

## 2018-04-23 DIAGNOSIS — Z79899 Other long term (current) drug therapy: Secondary | ICD-10-CM | POA: Insufficient documentation

## 2018-04-23 LAB — I-STAT CHEM 8, ED
BUN: 12 mg/dL (ref 6–20)
CHLORIDE: 105 mmol/L (ref 98–111)
Calcium, Ion: 1.13 mmol/L — ABNORMAL LOW (ref 1.15–1.40)
Creatinine, Ser: 0.5 mg/dL (ref 0.44–1.00)
GLUCOSE: 133 mg/dL — AB (ref 70–99)
HCT: 27 % — ABNORMAL LOW (ref 36.0–46.0)
HEMOGLOBIN: 9.2 g/dL — AB (ref 12.0–15.0)
POTASSIUM: 4.6 mmol/L (ref 3.5–5.1)
SODIUM: 139 mmol/L (ref 135–145)
TCO2: 28 mmol/L (ref 22–32)

## 2018-04-23 MED ORDER — HYDROCODONE-ACETAMINOPHEN 5-325 MG PO TABS
1.0000 | ORAL_TABLET | Freq: Once | ORAL | Status: AC
  Administered 2018-04-23: 1 via ORAL
  Filled 2018-04-23: qty 1

## 2018-04-23 MED ORDER — MORPHINE SULFATE (PF) 4 MG/ML IV SOLN
4.0000 mg | Freq: Once | INTRAVENOUS | Status: AC
  Administered 2018-04-23: 4 mg via INTRAVENOUS
  Filled 2018-04-23: qty 1

## 2018-04-23 MED ORDER — METHYLPREDNISOLONE 4 MG PO TBPK: ORAL_TABLET | ORAL | 0 refills | Status: DC

## 2018-04-23 MED ORDER — LORAZEPAM 2 MG/ML IJ SOLN
1.0000 mg | Freq: Once | INTRAMUSCULAR | Status: AC
  Administered 2018-04-23: 1 mg via INTRAVENOUS
  Filled 2018-04-23: qty 1

## 2018-04-23 MED ORDER — GADOBUTROL 1 MMOL/ML IV SOLN
5.0000 mL | Freq: Once | INTRAVENOUS | Status: AC | PRN
  Administered 2018-04-23: 5 mL via INTRAVENOUS

## 2018-04-23 NOTE — ED Triage Notes (Signed)
Pt says that yesterday she started having pain in the left buttocks that radiates down the left leg, associated with a feeling of weakness. She says she almost fell this morning. She has been taking ibuprofen, aleve and gabapentin for pain.

## 2018-04-23 NOTE — ED Notes (Signed)
ED Provider at bedside. 

## 2018-04-23 NOTE — ED Notes (Signed)
Pt transported to MRI 

## 2018-04-23 NOTE — ED Provider Notes (Signed)
[facility] DEPT Provider Note   CSN: [id number] Arrival date & time: 04/23/18  1914     History   Chief Complaint Chief Complaint  Patient presents with  . Leg Pain    HPI Crystal Carlson is a 52 y.o. female.  HPI  52 year old female with a history of pancreatic cancer with metastasis presents with left leg pain and weakness.  She is currently on chemotherapy, last received at the end of last month.  No trauma and the symptoms started yesterday.  Her left leg feels weak and she has fallen or nearly fallen a couple times since yesterday.  She is been having some numbness in her left foot that she thinks is due to the chemotherapy.  No back pain but the pain primarily starts in her left buttocks.  No fevers.  Has tried ibuprofen, Aleve, and gabapentin with partial relief of the pain.  Pain is currently an 8/10. No incontinence.  Past Medical History:  Diagnosis Date  . Cancer of pancreas ([place]) 05/26/2017  . Chronic pancreatitis ([place]) 05/19/2017  . Microcytic anemia 05/19/2017  . Prediabetes 2016   when living in [place]    Patient Active Problem List   Diagnosis Date Noted  . Peritonitis ([place])   . Ascites 01/30/2018  . Sepsis ([place]) 11/11/2017  . Thrombocytopenia ([place]) 11/11/2017  . SIRS (systemic inflammatory response syndrome) ([place]) 09/01/2017  . Fever of unknown origin 08/31/2017  . Antineoplastic chemotherapy induced pancytopenia ([place]) 07/25/2017  . Protein-calorie malnutrition, moderate ([place]) 07/25/2017  . Dehydration 07/24/2017  . Hypokalemia 07/24/2017  . Neutropenic fever ([place]) 07/24/2017  . Port-A-Cath in place 07/02/2017  . Cancer related pain 07/02/2017  . Encounter for antineoplastic chemotherapy 07/02/2017  . Goals of care, counseling/discussion 05/29/2017  . Pancreatic cancer metastasized to liver ([place]) 05/26/2017  . Abdominal pain 05/25/2017  . HTN (hypertension) 05/25/2017  . Ovarian cyst 05/25/2017  . Elevated LFTs 05/25/2017    . Hepatic metastasis ([place])   . Chronic pancreatitis ([place]) 05/19/2017  . Microcytic anemia 05/19/2017  . Prediabetes 08/18/2014    Past Surgical History:  Procedure Laterality Date  . ERCP N/A 06/09/2017   Procedure: ENDOSCOPIC RETROGRADE CHOLANGIOPANCREATOGRAPHY (ERCP);  Surgeon: Clarene Essex, MD;  Location: [facility];  Service: Endoscopy;  Laterality: N/A;  . IR FLUORO GUIDE PORT INSERTION RIGHT  06/04/2017  . IR PARACENTESIS  04/13/2018  . IR US GUIDE VASC ACCESS RIGHT  06/04/2017     OB History   None      Home Medications    Prior to Admission medications   Medication Sig Start Date End Date Taking? Authorizing Provider  ferrous sulfate 325 (65 FE) MG EC tablet Take 1 tablet (325 mg total) by mouth 3 (three) times daily with meals. 06/18/17   Ladell Pier, MD  hydrocortisone cream 1 % Apply 1 application topically 3 (three) times daily as needed for itching (minor skin irritation). 07/27/17   Theodis Blaze, MD  HYDROmorphone (DILAUDID) 4 MG tablet Take 1-2 tablets (4-8 mg total) by mouth every 4 (four) hours as needed for severe pain. 03/03/18   Ladell Pier, MD  levofloxacin (LEVAQUIN) 500 MG tablet Take 1 tablet (500 mg total) by mouth daily. 04/06/18   Ladell Pier, MD  lidocaine-prilocaine (EMLA) cream Apply to port site one hour prior to use. Do not rub in. Cover with plastic. 11/17/17   Owens Shark, NP  LORazepam (ATIVAN) 0.5 MG tablet Take 1 tablet (0.5 mg total) by mouth  every 8 (eight) hours as needed for anxiety (or nausea). 04/14/18   Ladell Pier, MD  methylPREDNISolone (MEDROL DOSEPAK) 4 MG TBPK tablet Day 1: 8 mg PO before breakfast, 4 mg after lunch and after dinner, and 8 mg at bedtime  Day 2: 4 mg PO before breakfast, after lunch, and after dinner and 8 mg at bedtime  Day 3: 4 mg PO before breakfast, after lunch, after dinner, and at bedtime  Day 4: 4 mg PO before breakfast, after lunch, and at bedtime  Day 5: 4 mg PO before breakfast and  at bedtime  Day 6: 4 mg PO before breakfast 04/23/18   Sherwood Gambler, MD  morphine (MS CONTIN) 15 MG 12 hr tablet Take 1 tablet (15 mg total) by mouth every 12 (twelve) hours. 04/14/18   Ladell Pier, MD  naproxen sodium (ALEVE) 220 MG tablet Take 440 mg by mouth 2 (two) times daily as needed (fever).    [provider]  predniSONE (DELTASONE) 5 MG tablet Take 2 tablets (10 mg total) by mouth daily with breakfast. 04/06/18   Ladell Pier, MD    Family History Family History  Problem Relation Age of Onset  . Hypertension Mother   . Arthritis Mother   . Diabetes Father   . Heart disease Father        ?valvular problems  . Hyperlipidemia Son   . Diabetes Brother     Social History Social History   Tobacco Use  . Smoking status: Never Smoker  . Smokeless tobacco: Never Used  Substance Use Topics  . Alcohol use: No  . Drug use: No     Allergies   Patient has no known allergies.   Review of Systems Review of Systems  Constitutional: Negative for fever.  Musculoskeletal: Positive for myalgias. Negative for back pain.  Neurological: Positive for weakness and numbness.  All other systems reviewed and are negative.    Physical Exam Updated Vital Signs BP (!) 107/95   Pulse 81   Temp 98.2 F (36.8 C) (Oral)   Resp 16   SpO2 96%   Physical Exam  Constitutional: She is oriented to person, place, and time. She appears well-developed and well-nourished. No distress.  HENT:  Head: Normocephalic and atraumatic.  Right Ear: External ear normal.  Left Ear: External ear normal.  Nose: Nose normal.  Eyes: Right eye exhibits no discharge. Left eye exhibits no discharge.  Cardiovascular: Normal rate, regular rhythm and normal heart sounds.  Pulmonary/Chest: Effort normal and breath sounds normal.  Abdominal: Soft. There is no tenderness.  Neurological: She is alert and oriented to person, place, and time.  5/5 strength in BLE but slightly weaker on left.  Normal gross sensation  Skin: Skin is warm and dry. She is not diaphoretic.  Nursing note and vitals reviewed.    ED Treatments / Results  Labs (all labs ordered are listed, but only abnormal results are displayed) Labs Reviewed  I-STAT CHEM 8, ED - Abnormal; Notable for the following components:      Result Value   Glucose, Bld 133 (*)    Calcium, Ion 1.13 (*)    Hemoglobin 9.2 (*)    HCT 27.0 (*)    All other components within normal limits    EKG None  Radiology Mr Lumbar Spine W Wo Contrast  Result Date: 04/23/2018 CLINICAL DATA:  Back pain radiating to left lower extremity. EXAM: MRI LUMBAR SPINE WITHOUT AND WITH CONTRAST TECHNIQUE: Multiplanar and multiecho  pulse sequences of the lumbar spine were obtained without and with intravenous contrast. CONTRAST:  6 mL Gadavist COMPARISON:  None. FINDINGS: Segmentation: Normal. The lowest disc space is considered to be L5-S1. Alignment:  Normal Vertebrae: No acute compression fracture, discitis-osteomyelitis of focal marrow lesion. Conus medullaris and cauda equina: The conus medullaris terminates at the L1 level. The cauda equina and conus medullaris are both normal. Paraspinal and other soft tissues: Multiple dilated loops of bowel. Disc levels: The T12-L5 disc spaces are normal. L5-S1: There is a small left subarticular disc extrusion with superior migration to the L5 infrapedicle level that narrows the left lateral recess and left neural foramen. There is also a small central disc protrusion mildly narrowing the lateral recesses. No central spinal canal stenosis. Right neural foramen is patent. No abnormal contrast enhancement. IMPRESSION: 1. L5-S1 left subarticular disc extrusion with superior migration narrowing the left lateral recess and left neural foramen and potentially contributing to radicular pain in the left L5 or S1 distribution. 2. No central spinal canal stenosis or evidence for cauda equina syndrome. 3. Dilated loops of bowel  within the abdomen, incompletely visualized. Consider correlation with abdominal radiograph. Electronically Signed   By: Ulyses Jarred M.D.   On: 04/23/2018 23:27    Procedures Procedures (including critical care time)  Medications Ordered in ED Medications  HYDROcodone-acetaminophen (NORCO/VICODIN) 5-325 MG per tablet 1 tablet (1 tablet Oral Given 04/23/18 2147)  LORazepam (ATIVAN) injection 1 mg (1 mg Intravenous Given 04/23/18 2159)  gadobutrol (GADAVIST) 1 MMOL/ML injection 5 mL (5 mLs Intravenous Contrast Given 04/23/18 2251)  morphine 4 MG/ML injection 4 mg (4 mg Intravenous Given 04/23/18 2242)     Initial Impression / Assessment and Plan / ED Course  I have reviewed the triage vital signs and the nursing notes.  Pertinent labs & imaging results that were available during my care of the patient were reviewed by me and considered in my medical decision making (see chart for details).     Given patient's complaint of weakness in association with her cancer history, MRI L-spine obtained.  No evidence of cauda equina, abscess, infection, or metastasis but there is a subarticular disc extrusion L5-S1 that could correlate with her symptoms. Given the weakness and falls, neurosurgery consulted.  Recommend Medrol Dosepak and follow-up in their office.  Call Dr. Irven [place] office on Monday 9/10.  Discussed strict return precautions.  Pain is controlled.  Final Clinical Impressions(s) / ED Diagnoses   Final diagnoses:  Herniation of intervertebral disc between L5 and S1    ED Discharge Orders         Ordered    methylPREDNISolone (MEDROL DOSEPAK) 4 MG TBPK tablet     04/23/18 2359           Sherwood Gambler, MD 04/24/18 0002

## 2018-04-23 NOTE — Discharge Instructions (Signed)
If you develop worsening pain, weakness in the leg, or if you develop numbness, incontinence of your bowels or bladders, or any other new/concerning symptoms or return to the ER for evaluation.  Otherwise follow-up with neurosurgery. Take the steroids until completion.

## 2018-04-28 ENCOUNTER — Other Ambulatory Visit: Payer: Self-pay | Admitting: *Deleted

## 2018-04-28 DIAGNOSIS — C259 Malignant neoplasm of pancreas, unspecified: Secondary | ICD-10-CM

## 2018-04-28 DIAGNOSIS — C787 Secondary malignant neoplasm of liver and intrahepatic bile duct: Principal | ICD-10-CM

## 2018-04-30 ENCOUNTER — Encounter (HOSPITAL_COMMUNITY): Payer: Self-pay | Admitting: Physician Assistant

## 2018-04-30 ENCOUNTER — Ambulatory Visit (HOSPITAL_COMMUNITY)
Admission: RE | Admit: 2018-04-30 | Discharge: 2018-04-30 | Disposition: A | Discharge status: Home / Self Care | Payer: Medicaid Other | Source: Ambulatory Visit | Attending: Oncology | Admitting: Oncology

## 2018-04-30 DIAGNOSIS — R180 Malignant ascites: Secondary | ICD-10-CM | POA: Diagnosis present

## 2018-04-30 DIAGNOSIS — C259 Malignant neoplasm of pancreas, unspecified: Secondary | ICD-10-CM | POA: Insufficient documentation

## 2018-04-30 DIAGNOSIS — C787 Secondary malignant neoplasm of liver and intrahepatic bile duct: Secondary | ICD-10-CM

## 2018-04-30 HISTORY — PX: IR PARACENTESIS: IMG2679

## 2018-04-30 MED ORDER — LIDOCAINE HCL 1 % IJ SOLN
INTRAMUSCULAR | Status: DC | PRN
  Administered 2018-04-30: 10 mL

## 2018-04-30 MED ORDER — LIDOCAINE HCL (PF) 2 % IJ SOLN
INTRAMUSCULAR | Status: AC
  Filled 2018-04-30: qty 20

## 2018-04-30 NOTE — Procedures (Signed)
PROCEDURE SUMMARY:  Successful US guided paracentesis from left lateral abdomen.  Yielded 2.7 liters of clear yellow fluid.  No immediate complications.  Patient tolerated well.   Marik Sedore S Eric Morganti PA-C 04/30/2018 3:20 PM

## 2018-05-02 ENCOUNTER — Other Ambulatory Visit: Payer: Self-pay | Admitting: Oncology

## 2018-05-03 ENCOUNTER — Other Ambulatory Visit: Payer: Self-pay | Admitting: Neurosurgery

## 2018-05-04 ENCOUNTER — Ambulatory Visit (HOSPITAL_COMMUNITY)
Admission: RE | Admit: 2018-05-04 | Discharge: 2018-05-04 | Disposition: A | Discharge status: Home / Self Care | Payer: Medicaid Other | Source: Ambulatory Visit | Attending: Oncology | Admitting: Oncology

## 2018-05-04 ENCOUNTER — Encounter (HOSPITAL_COMMUNITY): Payer: Self-pay

## 2018-05-04 DIAGNOSIS — C259 Malignant neoplasm of pancreas, unspecified: Secondary | ICD-10-CM | POA: Insufficient documentation

## 2018-05-04 DIAGNOSIS — C787 Secondary malignant neoplasm of liver and intrahepatic bile duct: Secondary | ICD-10-CM | POA: Insufficient documentation

## 2018-05-04 DIAGNOSIS — R188 Other ascites: Secondary | ICD-10-CM | POA: Insufficient documentation

## 2018-05-04 HISTORY — DX: Secondary malignant neoplasm of liver and intrahepatic bile duct: C78.7

## 2018-05-04 MED ORDER — SODIUM CHLORIDE 0.9 % IJ SOLN
INTRAMUSCULAR | Status: AC
  Filled 2018-05-04: qty 50

## 2018-05-04 MED ORDER — IOHEXOL 300 MG/ML  SOLN
100.0000 mL | Freq: Once | INTRAMUSCULAR | Status: AC | PRN
  Administered 2018-05-04: 100 mL via INTRAVENOUS

## 2018-05-05 ENCOUNTER — Inpatient Hospital Stay: Payer: Medicaid Other

## 2018-05-05 ENCOUNTER — Ambulatory Visit: Payer: Medicaid Other | Admitting: Nurse Practitioner

## 2018-05-05 ENCOUNTER — Telehealth: Payer: Self-pay | Admitting: Nurse Practitioner

## 2018-05-05 ENCOUNTER — Other Ambulatory Visit: Payer: Medicaid Other

## 2018-05-05 ENCOUNTER — Inpatient Hospital Stay (HOSPITAL_BASED_OUTPATIENT_CLINIC_OR_DEPARTMENT_OTHER): Payer: Medicaid Other | Admitting: Nurse Practitioner

## 2018-05-05 ENCOUNTER — Inpatient Hospital Stay: Payer: Medicaid Other | Attending: Nurse Practitioner

## 2018-05-05 VITALS — BP 117/89 | HR 90 | Temp 98.2°F | Resp 18 | Ht 65.0 in | Wt 124.7 lb

## 2018-05-05 DIAGNOSIS — R634 Abnormal weight loss: Secondary | ICD-10-CM | POA: Diagnosis not present

## 2018-05-05 DIAGNOSIS — C259 Malignant neoplasm of pancreas, unspecified: Secondary | ICD-10-CM

## 2018-05-05 DIAGNOSIS — C257 Malignant neoplasm of other parts of pancreas: Secondary | ICD-10-CM | POA: Diagnosis not present

## 2018-05-05 DIAGNOSIS — R188 Other ascites: Secondary | ICD-10-CM | POA: Diagnosis not present

## 2018-05-05 DIAGNOSIS — Z95828 Presence of other vascular implants and grafts: Secondary | ICD-10-CM

## 2018-05-05 DIAGNOSIS — C787 Secondary malignant neoplasm of liver and intrahepatic bile duct: Secondary | ICD-10-CM | POA: Insufficient documentation

## 2018-05-05 DIAGNOSIS — D61811 Other drug-induced pancytopenia: Secondary | ICD-10-CM | POA: Insufficient documentation

## 2018-05-05 DIAGNOSIS — Z5111 Encounter for antineoplastic chemotherapy: Secondary | ICD-10-CM | POA: Diagnosis not present

## 2018-05-05 DIAGNOSIS — Z23 Encounter for immunization: Secondary | ICD-10-CM

## 2018-05-05 DIAGNOSIS — G893 Neoplasm related pain (acute) (chronic): Secondary | ICD-10-CM | POA: Insufficient documentation

## 2018-05-05 LAB — CMP (CANCER CENTER ONLY)
ALT: 25 U/L (ref 0–44)
ANION GAP: 5 (ref 5–15)
AST: 33 U/L (ref 15–41)
Albumin: 2.2 g/dL — ABNORMAL LOW (ref 3.5–5.0)
Alkaline Phosphatase: 230 U/L — ABNORMAL HIGH (ref 38–126)
BUN: 13 mg/dL (ref 6–20)
CO2: 26 mmol/L (ref 22–32)
Calcium: 8.2 mg/dL — ABNORMAL LOW (ref 8.9–10.3)
Chloride: 108 mmol/L (ref 98–111)
Creatinine: 0.71 mg/dL (ref 0.44–1.00)
GFR, Est AFR Am: >60 mL/min (ref >60)
GFR, Estimated: >60 mL/min (ref >60)
GLUCOSE: 129 mg/dL — AB (ref 70–99)
POTASSIUM: 3.9 mmol/L (ref 3.5–5.1)
SODIUM: 139 mmol/L (ref 135–145)
Total Bilirubin: 1.1 mg/dL (ref 0.3–1.2)
Total Protein: 6.8 g/dL (ref 6.5–8.1)

## 2018-05-05 LAB — CBC WITH DIFFERENTIAL (CANCER CENTER ONLY)
Basophils Absolute: 0 10*3/uL (ref 0.0–0.1)
Basophils Relative: 1 %
Eosinophils Absolute: 0 10*3/uL (ref 0.0–0.5)
Eosinophils Relative: 1 %
HEMATOCRIT: 31 % — AB (ref 34.8–46.6)
Hemoglobin: 9.8 g/dL — ABNORMAL LOW (ref 11.6–15.9)
LYMPHS PCT: 7 %
Lymphs Abs: 0.5 10*3/uL — ABNORMAL LOW (ref 0.9–3.3)
MCH: 25.2 pg (ref 25.1–34.0)
MCHC: 31.6 g/dL (ref 31.5–36.0)
MCV: 79.6 fL (ref 79.5–101.0)
MONO ABS: 0.7 10*3/uL (ref 0.1–0.9)
MONOS PCT: 11 %
NEUTROS ABS: 5.3 10*3/uL (ref 1.5–6.5)
Neutrophils Relative %: 80 %
Platelet Count: 132 10*3/uL — ABNORMAL LOW (ref 145–400)
RBC: 3.89 MIL/uL (ref 3.70–5.45)
RDW: 22.3 % — AB (ref 11.2–14.5)
WBC Count: 6.5 10*3/uL (ref 3.9–10.3)

## 2018-05-05 MED ORDER — SODIUM CHLORIDE 0.9 % IV SOLN
Freq: Once | INTRAVENOUS | Status: AC
  Administered 2018-05-05: 12:00:00 via INTRAVENOUS
  Filled 2018-05-05: qty 250

## 2018-05-05 MED ORDER — LEVOFLOXACIN 500 MG PO TABS: 500.0000 mg | ORAL_TABLET | Freq: Every day | ORAL | 0 refills | Status: DC

## 2018-05-05 MED ORDER — PROCHLORPERAZINE MALEATE 10 MG PO TABS
10.0000 mg | ORAL_TABLET | Freq: Once | ORAL | Status: AC
  Administered 2018-05-05: 10 mg via ORAL

## 2018-05-05 MED ORDER — HYDROMORPHONE HCL 4 MG PO TABS: 4.0000 mg | ORAL_TABLET | ORAL | 0 refills | Status: DC | PRN

## 2018-05-05 MED ORDER — PROCHLORPERAZINE MALEATE 10 MG PO TABS
ORAL_TABLET | ORAL | Status: AC
  Filled 2018-05-05: qty 1

## 2018-05-05 MED ORDER — INFLUENZA VAC SPLIT QUAD 0.5 ML IM SUSY
PREFILLED_SYRINGE | INTRAMUSCULAR | Status: AC
  Filled 2018-05-05: qty 0.5

## 2018-05-05 MED ORDER — LORAZEPAM 0.5 MG PO TABS: 0.5000 mg | ORAL_TABLET | Freq: Three times a day (TID) | ORAL | 0 refills | Status: DC | PRN

## 2018-05-05 MED ORDER — SODIUM CHLORIDE 0.9% FLUSH
10.0000 mL | Freq: Once | INTRAVENOUS | Status: AC
  Administered 2018-05-05: 10 mL
  Filled 2018-05-05: qty 10

## 2018-05-05 MED ORDER — SODIUM CHLORIDE 0.9 % IV SOLN
700.0000 mg/m2 | Freq: Once | INTRAVENOUS | Status: AC
  Administered 2018-05-05: 1102 mg via INTRAVENOUS
  Filled 2018-05-05: qty 28.98

## 2018-05-05 MED ORDER — INFLUENZA VAC SPLIT QUAD 0.5 ML IM SUSY
0.5000 mL | PREFILLED_SYRINGE | Freq: Once | INTRAMUSCULAR | Status: AC
  Administered 2018-05-05: 0.5 mL via INTRAMUSCULAR

## 2018-05-05 MED ORDER — HEPARIN SOD (PORK) LOCK FLUSH 100 UNIT/ML IV SOLN
500.0000 [IU] | Freq: Once | INTRAVENOUS | Status: AC | PRN
  Administered 2018-05-05: 500 [IU]
  Filled 2018-05-05: qty 5

## 2018-05-05 MED ORDER — SODIUM CHLORIDE 0.9% FLUSH
10.0000 mL | INTRAVENOUS | Status: DC | PRN
  Administered 2018-05-05: 10 mL
  Filled 2018-05-05: qty 10

## 2018-05-05 MED ORDER — PACLITAXEL PROTEIN-BOUND CHEMO INJECTION 100 MG
80.0000 mg/m2 | Freq: Once | INTRAVENOUS | Status: AC
  Administered 2018-05-05: 125 mg via INTRAVENOUS
  Filled 2018-05-05: qty 25

## 2018-05-05 MED FILL — levoFLOXacin 500 MG TABS: 500 | 30 days supply | Qty: 30 | Fill #0

## 2018-05-05 MED FILL — HYDROmorphone HCL 4 MG TABS: 4 | 2 days supply | Qty: 30 | Fill #0

## 2018-05-05 MED FILL — LORazepam 0.5 MG TABS: 0.5 | 10 days supply | Qty: 30 | Fill #0

## 2018-05-05 NOTE — Telephone Encounter (Signed)
Unable to schedule 9/18 los - due to cap - logged - will call patient when appt is scheduled.

## 2018-05-05 NOTE — Progress Notes (Addendum)
[facility] OFFICE PROGRESS NOTE   Diagnosis: Pancreas cancer  INTERVAL HISTORY:   Crystal Carlson returns as scheduled.  She completed cycle 10 gemcitabine/Abraxane 04/14/2018.  She denies nausea/vomiting.  No mouth sores.  No significant diarrhea.  Neuropathy symptoms are stable.  She recently developed left back pain and left leg weakness.  She fell.  She was seen in the emergency department on 04/23/2018.  MRI the lumbar spine showed L5-S1 left subarticular disc extrusion with superior migration narrowing the left lateral recess and left neural foramen.  She reports she has seen Dr. Trenton Gammon and is scheduled for surgery 05/17/2018.  Objective:  Vital signs in last 24 hours:  Blood pressure 117/89, pulse 90, temperature 98.2 F (36.8 C), resp. rate 18, height 5\' 5"  (1.651 m), weight 124 lb 11.2 oz (56.6 kg), SpO2 100 %.    HEENT: No thrush or ulcers. Resp: Lungs clear bilaterally. Cardio: Regular rate and rhythm. GI: Abdomen is distended consistent with ascites. Vascular: Edema at the ankles bilaterally. Neuro: Alert and oriented.  Left proximal leg weakness. Skin: Ecchymosis surrounding the right eye. Port-A-Cath without erythema.   Lab Results:  Lab Results  Component Value Date   WBC 7.4 04/14/2018   HGB 9.2 (L) 04/23/2018   HCT 27.0 (L) 04/23/2018   MCV 80.9 04/14/2018   PLT 160 04/14/2018   NEUTROABS 6.0 04/14/2018    Imaging:  Ct Abdomen Pelvis W Contrast  Result Date: 05/04/2018 CLINICAL DATA:  Pancreas cancer with liver metastasis. EXAM: CT ABDOMEN AND PELVIS WITH CONTRAST TECHNIQUE: Multidetector CT imaging of the abdomen and pelvis was performed using the standard protocol following bolus administration of intravenous contrast. CONTRAST:  19mL OMNIPAQUE IOHEXOL 300 MG/ML  SOLN COMPARISON:  CT AP 02/01/2018 FINDINGS: Lower chest: Subsegmental atelectasis identified within the lower lobes. No pleural effusion or airspace consolidation noted. Hepatobiliary:  Metallic stent is identified within the common bile duct. There is persistent pneumobilia. Gallbladder appears normal. Dominant metastasis within segment 8 measures 1.4 by 0.7 cm, image 30/7. Previously 1.6 x 1.3 cm. Index metastasis within segment 6 measures 1 cm, image 36/7. Previously 1.1 cm. Multiple low-attenuation foci within the liver are again noted. Index lesion within segment 7 measures 6 mm, image 42/7. Previously 1 cm. Anterior segment 7 low-density structure measures 9 mm, image 36/7. Previously 1.1 cm. Also within segment 7 is a 1 cm low-density structure, image 33/7. Previously 1.1 cm. Pancreas: Ill defined tumor originating from the from the head of pancreas and extending into the surrounding retroperitoneal structures is again noted. This measures approximately 6.9 by 4.8 by 3.2 cm. And appears similar to previous exam. Atrophy of the pancreatic body and tail with dilatation of the main duct is similar to previous study. Spleen: The spleen is enlarged measuring 17.5 cm. Previously 16.8 cm. Adrenals/Urinary Tract: The right kidney appears normal. Left-sided striated nephrogram is identified. No mass or hydronephrosis noted bilaterally. Urinary bladder appears normal. Stomach/Bowel: There is moderate distension of the gastric lumen. There is diffuse abnormal small bowel wall edema identified. There is also mild edema involving the ascending colon and transverse colon. No pathologic dilatation of the colon. Vascular/Lymphatic: Normal appearance of the abdominal aorta. Similar appearance of ill-defined retroperitoneal soft tissue which extends to the level of the aortic bifurcation. Cavernous transformation of the portal vein identified. Extensive upper abdominal collaterals identified. Reproductive: Uterus and bilateral adnexa are unremarkable. Other: There has been interval increase in volume of abdominopelvic ascites. Musculoskeletal: No acute or significant osseous findings. IMPRESSION:  1. Similar  appearance of ill-defined pancreatic mass which extends into the surrounding retroperitoneal soft tissues. 2. Multiple liver metastases are again noted which appear slightly improved from previous exam. 3. Increase in volume of abdominal and pelvic ascites. Cannot rule out malignant ascites. 4. Diffuse small bowel wall edema and mild edema of the ascending colon and transverse colon. In the setting of large volume ascites is a somewhat of a nonspecific finding. Colitis/enteritis is not excluded. 5. Cavernous transformation of the portal vein 6. Common bile duct stent remains in place with pneumobilia. Electronically Signed   By: Kerby Moors M.D.   On: 05/04/2018 15:44    Medications: I have reviewed the patient's current medications.  Assessment/Plan: 1. 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 Adenocarcinoma consistent with pancreatobiliary primary.  Cycle 1 FOLFIRINOX 06/18/2017  Cycle 2FOLFIRINOX 07/02/2017  Cycle 3 FOLFIRINOX 07/16/2017 (oxaliplatin dose reduced due to mild thrombocytopenia)  CT 07/24/2017-new splenomegaly and left hydronephrosis  Cycle 4 FOLFIRINOX 08/08/2017  Cycle 5 FOLFIRINOX 08/24/2017  CT abdomen/pelvis 09/03/2017-stable liver lesions and pancreas mass, stable right biliary dilatation, possible new 8 mm right liver lesion versus a vascular phenomena, lesion at the right paracolic gutter was present in the past-significance unclear  Cycle 6 FOLFIRINOX1/29/2019  Cycle7 FOLFIRINOX2/19/2019 (Irinotecan andOxaliplatin dose  reduced due to thrombocytopenia)  CT abdomen/pelvis 10/25/2017- findings concerning for enterocolitis. No bowel obstruction. Ill-defined pancreatic head mass. Multiple hepatic hypoenhancing lesions with the largest measuring 1.9 x 2.0 cm, previously 1.7 x 1.7 cm; 9 x 10 mm hypoenhancing lesion in the inferior right lobe of the liver slightly larger compared to the previous CT; 1.6 x 2.4 cm enhancing lesion in the right paracolic gutter increased in size compared to the prior CT.  Cycle 1 gemcitabine/Abraxane 11/04/2017  Cycle 2 gemcitabine/Abraxane 11/25/2017  Cycle 3 gemcitabine/Abraxane 12/08/2017  Cycle 4 gemcitabine 12/23/2017 (Abraxane held secondary to neuropathy)  CTs abdomen/pelvis 01/12/2018- decrease in size of the pancreatic mass and hepatic metastasis; slight interval decrease in size of right paracolic gutter implant; worsening abdominal/pelvic ascites. Bowel wall thickening likely due to ascites and low albumin. 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  Cycle 5 gemcitabine 01/13/2018 (Abraxane held secondary to neuropathy)  Cycle 6 gemcitabine/Abraxane 01/27/2018  CT abdomen/pelvis 02/01/2018- dominant metastases in the liver are unchanged, multiple new low-density lesions throughout the right lobe-small abscesses?, No change in pancreas mass  Cycle 7 gemcitabine/Abraxane 02/16/2018(gemcitabine and Abraxane dose reduced due to previous neutropenia)  Cycle 8 gemcitabine/Abraxane 03/03/2018  Cycle 9 gemcitabine/Abraxane 03/25/2018  Cycle 10 gemcitabine/Abraxane 04/14/2018  Restaging CTs 05/04/2018- similar ill-defined pancreatic mass; multiple liver metastases again noted, appears slightly improved; increase in volume of abdominal and pelvic ascites; diffuse small bowel wall edema and mild edema of the ascending colon and transverse colon.  Cycle 11 gemcitabine/Abraxane 05/05/2018  2. Pain secondary to #1.Improved.  3. Weight losssecondary to  #1.  4. Microcytic anemia  Transfused with packed red blood cells 07/25/2017, 16 2019, and 11/27/2017  5. History of thrombocytopenia secondary to chemotherapy  6. Port-A-Cath placement 06/04/2017  7. Elevated liver enzymes/bilirubin10/22/2018-likely early biliary obstruction secondary to pancreas cancer, status post ERCP-placement of a common bile duct stent  06/09/2018  8.Postprandial abdominal pain. Protonix initiated 07/16/2017.Trial of Reglan initiated 08/24/2017.  9.Admission with fever 07/24/2017-no source for infection identified, resolved   10.Diarrheafollowing cycle 3 FOLFIRINOX-resolved  11.Left hydronephrosis noted on CT12/03/2017-etiology unclear, urology consulted-stent not recommended.Mild left hydronephrosis noted on CT 10/25/2017. Creatinine normal 10/27/2017.  12. Admission 08/31/2017 with a high fever, blood culture positive for gram-negative rods-Klebsiella pneumoniae identified  13.Fever 10/25/2017 status post evaluation in the emergency department. CT with findings concerning for enterocolitis.   14. Admission 11/11/2017 with a high fever-blood cultures (peripheral and Port-A-Cath) positive for gram-negative rods--STENOTROPHOMONAS MALTOPHILIA;course of Septra completed as an outpatient.  15. Rash-potentially secondary to gemcitabine or infection, improved  16. Pancytopenia secondary to chemotherapy  17.Intermittent fever. Question "tumor fever", question infection. Now on Bactrim prophylaxis, improved  18. Ascites.Paracentesis 01/14/2018- reactive mesothelial cells, mixed inflammation;culture positive for Klebsiella and enterococcustreated withAugmentin  Admission with fever 01/30/2018, blood cultures negative, discharged on ciprofloxacin/Flagyl  Recurrent ascites, paracentesis 02/23/2018-culture positive for strep viridans, placed on Levaquin  Paracentesis 03/03/2018- 2 L of fluid removed, culture and cytology  negative  19.  Status post evaluation back pain in the emergency department 04/23/2018-MRI lumbar spine with L5-S1 left subarticular disc extrusion    Disposition: Ms. Morace has completed 10 cycles of gemcitabine/Abraxane.  Recent restaging CTs are stable to slightly improved.  Plan to continue gemcitabine/Abraxane every 3 weeks, cycle 11 today.  She has recurrent ascites.  Cytology negative multiple times.  We are making a referral to gastroenterology.  She recently developed back pain and left leg weakness.  MRI shows disc extrusion at L5-S1.  She has seen Dr. Trenton Gammon and is scheduled for surgery 05/17/2018.  She will receive the influenza vaccine today.  She will return for lab, follow-up and gemcitabine/Abraxane in 3 weeks.  She will contact the office in the interim with any problems.  She was provided with refills for hydromorphone, Ativan and Levaquin at today's visit.  Patient seen with Dr. Benay Spice.  25 minutes were spent face-to-face at today's visit with the majority of that time involved in counseling/coordination of care.     [name] ANP/GNP-BC   05/05/2018  10:48 AM This was a shared visit with [name].  Ms. Wisinski was interviewed and examined.  The restaging CT reveals no evidence of progressive pancreas cancer.  She will continue gemcitabine/Abraxane.  We made a GI referral for help with management of refractory ascites.  She had a recent fall and has an L5 radiculopathy.  I discussed the case with Dr. Annette Stable.  He feels the fall is related to a partial left foot drop from the lumbar disc disease.  He will perform surgery for relief of her symptoms.  He will check a platelet count on the day of surgery.  [name], MD

## 2018-05-05 NOTE — Patient Instructions (Signed)
[facility] Discharge Instructions for Patients Receiving Chemotherapy  Today you received the following chemotherapy agents: Abraxane and Gemzar   To help prevent nausea and vomiting after your treatment, we encourage you to take your nausea medication as directed.    If you develop nausea and vomiting that is not controlled by your nausea medication, call the clinic.   BELOW ARE SYMPTOMS THAT SHOULD BE REPORTED IMMEDIATELY:  *FEVER GREATER THAN 100.5 F  *CHILLS WITH OR WITHOUT FEVER  NAUSEA AND VOMITING THAT IS NOT CONTROLLED WITH YOUR NAUSEA MEDICATION  *UNUSUAL SHORTNESS OF BREATH  *UNUSUAL BRUISING OR BLEEDING  TENDERNESS IN MOUTH AND THROAT WITH OR WITHOUT PRESENCE OF ULCERS  *URINARY PROBLEMS  *BOWEL PROBLEMS  UNUSUAL RASH Items with * indicate a potential emergency and should be followed up as soon as possible.  Feel free to call the clinic should you have any questions or concerns. The clinic phone number is [phone].  Please show the CHEMO ALERT CARD at check-in to the Emergency Department and triage nurse.   

## 2018-05-06 ENCOUNTER — Other Ambulatory Visit: Payer: Self-pay | Admitting: Nurse Practitioner

## 2018-05-06 ENCOUNTER — Ambulatory Visit (HOSPITAL_COMMUNITY)
Admission: RE | Admit: 2018-05-06 | Discharge: 2018-05-06 | Disposition: A | Discharge status: Home / Self Care | Payer: Medicaid Other | Source: Ambulatory Visit | Attending: Nurse Practitioner | Admitting: Nurse Practitioner

## 2018-05-06 DIAGNOSIS — R188 Other ascites: Secondary | ICD-10-CM | POA: Diagnosis not present

## 2018-05-06 DIAGNOSIS — C787 Secondary malignant neoplasm of liver and intrahepatic bile duct: Principal | ICD-10-CM

## 2018-05-06 DIAGNOSIS — C259 Malignant neoplasm of pancreas, unspecified: Secondary | ICD-10-CM

## 2018-05-06 LAB — CANCER ANTIGEN 19-9: CA 19-9: 483 U/mL — ABNORMAL HIGH (ref 0–35)

## 2018-05-06 MED ORDER — LIDOCAINE HCL 1 % IJ SOLN
INTRAMUSCULAR | Status: AC
  Filled 2018-05-06: qty 20

## 2018-05-06 NOTE — Progress Notes (Signed)
IR requested for possible image-guided paracentesis.  Limited abdominal ultrasound revealed scant fluid surrounded by bowel that cannot be safely removed with procedure today.  Images reviewed with Dr. Earleen Newport who agrees.  Informed patient that procedure will not occur today.  All questions answered and concerns addressed. Patient conveys understanding and agrees with plan. IR available in future if needed.  Bea Graff Krishana Lutze, PA-C 05/06/2018, 1:57 PM

## 2018-05-07 ENCOUNTER — Telehealth: Payer: Self-pay | Admitting: Gastroenterology

## 2018-05-07 NOTE — Telephone Encounter (Signed)
She is actually NOT being referred for an EUS. She is being referred for 'refractory ascites' in the setting of metastatic pancreatic cancer to liver. She is a patient of [facility] and should see them for this problem.

## 2018-05-07 NOTE — Telephone Encounter (Signed)
Referral routed back to referring provider.

## 2018-05-11 ENCOUNTER — Telehealth: Payer: Self-pay | Admitting: *Deleted

## 2018-05-11 DIAGNOSIS — C259 Malignant neoplasm of pancreas, unspecified: Secondary | ICD-10-CM

## 2018-05-11 DIAGNOSIS — C787 Secondary malignant neoplasm of liver and intrahepatic bile duct: Principal | ICD-10-CM

## 2018-05-11 NOTE — Telephone Encounter (Signed)
Husband called to say patient needs to be scheduled for a paracentesis- scheduled for Wednesday am.  He also states she had an episode of shaking and chills earlier today-no fever. Took aleve and the shaking stopped. Instructed to call us if she has another episode- we would want to see her. Verbalized understanding

## 2018-05-11 NOTE — Pre-Procedure Instructions (Signed)
Crystal Carlson  05/11/2018      [facility], [place] - [address] [place] [address] Phone: [phone] Fax: [facility], [place] - [address] AT [place] [address] Phone: [phone] Fax: [phone]    Your procedure is scheduled on Monday September 30.  Report to [facility] Admitting at 11:40 A.M.  Call this number if you have problems the morning of surgery:  [phone]   Remember:  Do not eat or drink after midnight.   Take these medicines the morning of surgery with A SIP OF WATER:   Prednisone (Deltasone) Levofloxacin (Levaquin) Gabapentin (neurontin) Hydromorphone (Dilaudid) if needed Lorazepam (Ativan) if needed  7 days prior to surgery STOP taking any Aspirin(unless otherwise instructed by your surgeon), Aleve, Naproxen, Ibuprofen, Motrin, Advil, Goody's, BC's, all herbal medications, fish oil, and all vitamins     Do not wear jewelry, make-up or nail polish.  Do not wear lotions, powders, or perfumes, or deodorant.  Do not shave 48 hours prior to surgery.  Men may shave face and neck.  Do not bring valuables to the hospital.  [facility] is not responsible for any belongings or valuables.  Contacts, dentures or bridgework may not be worn into surgery.  Leave your suitcase in the car.  After surgery it may be brought to your room.  For patients admitted to the hospital, discharge time will be determined by your treatment team.  Patients discharged the day of surgery will not be allowed to drive home.    Special instructions:    [facility]- Preparing For Surgery  Before surgery, you can play an important role. Because skin is not sterile, your skin needs to be as free of germs as possible. You can reduce the number of germs on your skin by washing with CHG (chlorahexidine  gluconate) Soap before surgery.  CHG is an antiseptic cleaner which kills germs and bonds with the skin to continue killing germs even after washing.    Oral Hygiene is also important to reduce your risk of infection.  Remember - BRUSH YOUR TEETH THE MORNING OF SURGERY WITH YOUR REGULAR TOOTHPASTE  Please do not use if you have an allergy to CHG or antibacterial soaps. If your skin becomes reddened/irritated stop using the CHG.  Do not shave (including legs and underarms) for at least 48 hours prior to first CHG shower. It is OK to shave your face.  Please follow these instructions carefully.   1. Shower the NIGHT BEFORE SURGERY and the MORNING OF SURGERY with CHG.   2. If you chose to wash your hair, wash your hair first as usual with your normal shampoo.  3. After you shampoo, rinse your hair and body thoroughly to remove the shampoo.  4. Use CHG as you would any other liquid soap. You can apply CHG directly to the skin and wash gently with a scrungie or a clean washcloth.   5. Apply the CHG Soap to your body ONLY FROM THE NECK DOWN.  Do not use on open wounds or open sores. Avoid contact with your eyes, ears, mouth and genitals (private parts). Wash Face and genitals (private parts)  with your normal soap.  6. Wash thoroughly, paying special attention to the area where your surgery will be performed.  7. Thoroughly rinse your body with warm  water from the neck down.  8. DO NOT shower/wash with your normal soap after using and rinsing off the CHG Soap.  9. Pat yourself dry with a CLEAN TOWEL.  10. Wear CLEAN PAJAMAS to bed the night before surgery, wear comfortable clothes the morning of surgery  11. Place CLEAN SHEETS on your bed the night of your first shower and DO NOT SLEEP WITH PETS.    Day of Surgery:  Do not apply any deodorants/lotions.  Please wear clean clothes to the hospital/surgery center.   Remember to brush your teeth WITH YOUR REGULAR TOOTHPASTE.    Please  read over the following fact sheets that you were given. Coughing and Deep Breathing, MRSA Information and Surgical Site Infection Prevention

## 2018-05-12 ENCOUNTER — Encounter (HOSPITAL_COMMUNITY): Payer: Self-pay

## 2018-05-12 ENCOUNTER — Encounter (HOSPITAL_COMMUNITY)
Admission: RE | Admit: 2018-05-12 | Discharge: 2018-05-12 | Disposition: A | Discharge status: Home / Self Care | Payer: Medicaid Other | Source: Ambulatory Visit | Attending: Neurosurgery | Admitting: Neurosurgery

## 2018-05-12 ENCOUNTER — Ambulatory Visit (HOSPITAL_COMMUNITY)
Admission: RE | Admit: 2018-05-12 | Discharge: 2018-05-12 | Disposition: A | Discharge status: Home / Self Care | Payer: Medicaid Other | Source: Ambulatory Visit | Attending: Oncology | Admitting: Oncology

## 2018-05-12 DIAGNOSIS — C259 Malignant neoplasm of pancreas, unspecified: Secondary | ICD-10-CM | POA: Diagnosis present

## 2018-05-12 DIAGNOSIS — R180 Malignant ascites: Secondary | ICD-10-CM | POA: Diagnosis not present

## 2018-05-12 DIAGNOSIS — C787 Secondary malignant neoplasm of liver and intrahepatic bile duct: Secondary | ICD-10-CM

## 2018-05-12 HISTORY — PX: IR PARACENTESIS: IMG2679

## 2018-05-12 HISTORY — DX: Major depressive disorder, single episode, unspecified: F32.9

## 2018-05-12 HISTORY — DX: Depression, unspecified: F32.A

## 2018-05-12 HISTORY — DX: Edema, unspecified: R60.9

## 2018-05-12 HISTORY — DX: Anxiety disorder, unspecified: F41.9

## 2018-05-12 LAB — SURGICAL PCR SCREEN
MRSA, PCR: NEGATIVE
Staphylococcus aureus: NEGATIVE

## 2018-05-12 MED ORDER — LIDOCAINE HCL (PF) 2 % IJ SOLN
INTRAMUSCULAR | Status: DC | PRN
  Administered 2018-05-12: 10 mL

## 2018-05-12 MED ORDER — LIDOCAINE HCL 1 % IJ SOLN
INTRAMUSCULAR | Status: AC
  Filled 2018-05-12: qty 20

## 2018-05-12 MED ORDER — CHLORHEXIDINE GLUCONATE CLOTH 2 % EX PADS: 6.0000 | MEDICATED_PAD | Freq: Once | CUTANEOUS | Status: DC

## 2018-05-12 NOTE — Procedures (Signed)
PROCEDURE SUMMARY:  Successful US guided paracentesis from RLQ.  Yielded 4 L of clear yellow fluid.  No immediate complications.  Pt tolerated well.   Specimen was not sent for labs.  Ascencion Dike PA-C 05/12/2018 11:28 AM

## 2018-05-14 ENCOUNTER — Telehealth: Payer: Self-pay | Admitting: Oncology

## 2018-05-14 NOTE — Telephone Encounter (Signed)
Scheduled appt per 9/18 los - called and left reminder message with appt date and time for 10/9

## 2018-05-17 ENCOUNTER — Inpatient Hospital Stay (HOSPITAL_COMMUNITY): Payer: Medicaid Other

## 2018-05-17 ENCOUNTER — Encounter (HOSPITAL_COMMUNITY)
Admission: RE | Disposition: A | Discharge status: Home / Self Care | Payer: Self-pay | Source: Ambulatory Visit | Attending: Neurosurgery

## 2018-05-17 ENCOUNTER — Encounter (HOSPITAL_COMMUNITY): Payer: Self-pay | Admitting: Urology

## 2018-05-17 ENCOUNTER — Inpatient Hospital Stay (HOSPITAL_COMMUNITY): Payer: Medicaid Other | Admitting: Registered Nurse

## 2018-05-17 ENCOUNTER — Observation Stay (HOSPITAL_COMMUNITY)
Admission: RE | Admit: 2018-05-17 | Discharge: 2018-05-18 | Disposition: A | Discharge status: Home / Self Care | Payer: Medicaid Other | Source: Ambulatory Visit | Attending: Neurosurgery | Admitting: Neurosurgery

## 2018-05-17 DIAGNOSIS — Z7952 Long term (current) use of systemic steroids: Secondary | ICD-10-CM | POA: Diagnosis not present

## 2018-05-17 DIAGNOSIS — D649 Anemia, unspecified: Secondary | ICD-10-CM | POA: Insufficient documentation

## 2018-05-17 DIAGNOSIS — F419 Anxiety disorder, unspecified: Secondary | ICD-10-CM | POA: Insufficient documentation

## 2018-05-17 DIAGNOSIS — M5117 Intervertebral disc disorders with radiculopathy, lumbosacral region: Secondary | ICD-10-CM | POA: Diagnosis present

## 2018-05-17 DIAGNOSIS — K861 Other chronic pancreatitis: Secondary | ICD-10-CM | POA: Insufficient documentation

## 2018-05-17 DIAGNOSIS — C259 Malignant neoplasm of pancreas, unspecified: Secondary | ICD-10-CM | POA: Insufficient documentation

## 2018-05-17 DIAGNOSIS — Z79891 Long term (current) use of opiate analgesic: Secondary | ICD-10-CM | POA: Diagnosis not present

## 2018-05-17 DIAGNOSIS — M5416 Radiculopathy, lumbar region: Secondary | ICD-10-CM | POA: Diagnosis present

## 2018-05-17 DIAGNOSIS — C787 Secondary malignant neoplasm of liver and intrahepatic bile duct: Secondary | ICD-10-CM | POA: Insufficient documentation

## 2018-05-17 DIAGNOSIS — Z79899 Other long term (current) drug therapy: Secondary | ICD-10-CM | POA: Diagnosis not present

## 2018-05-17 DIAGNOSIS — Z419 Encounter for procedure for purposes other than remedying health state, unspecified: Secondary | ICD-10-CM

## 2018-05-17 HISTORY — PX: LUMBAR LAMINECTOMY/DECOMPRESSION MICRODISCECTOMY: SHX5026

## 2018-05-17 LAB — GLUCOSE, CAPILLARY: GLUCOSE-CAPILLARY: 91 mg/dL (ref 70–99)

## 2018-05-17 LAB — POCT PREGNANCY, URINE: PREG TEST UR: NEGATIVE

## 2018-05-17 SURGERY — LUMBAR LAMINECTOMY/DECOMPRESSION MICRODISCECTOMY 1 LEVEL
Anesthesia: General | Site: Spine Lumbar | Laterality: Left

## 2018-05-17 MED ORDER — PROPOFOL 10 MG/ML IV BOLUS
INTRAVENOUS | Status: DC | PRN
  Administered 2018-05-17: 120 mg via INTRAVENOUS

## 2018-05-17 MED ORDER — HYDROCODONE-ACETAMINOPHEN 10-325 MG PO TABS: 2.0000 | ORAL_TABLET | ORAL | Status: DC | PRN

## 2018-05-17 MED ORDER — MIDAZOLAM HCL 5 MG/5ML IJ SOLN
INTRAMUSCULAR | Status: DC | PRN
  Administered 2018-05-17 (×2): 1 mg via INTRAVENOUS

## 2018-05-17 MED ORDER — CEFAZOLIN SODIUM-DEXTROSE 2-4 GM/100ML-% IV SOLN: 2.0000 g | INTRAVENOUS | Status: AC

## 2018-05-17 MED ORDER — SODIUM CHLORIDE 0.9 % IV SOLN
250.0000 mL | INTRAVENOUS | Status: DC
  Administered 2018-05-17: 250 mL via INTRAVENOUS

## 2018-05-17 MED ORDER — MIDAZOLAM HCL 2 MG/2ML IJ SOLN
INTRAMUSCULAR | Status: AC
  Filled 2018-05-17: qty 2

## 2018-05-17 MED ORDER — LEVOFLOXACIN 500 MG PO TABS
500.0000 mg | ORAL_TABLET | Freq: Every day | ORAL | Status: DC
  Administered 2018-05-18: 500 mg via ORAL
  Filled 2018-05-17 (×2): qty 1

## 2018-05-17 MED ORDER — FENTANYL CITRATE (PF) 250 MCG/5ML IJ SOLN
INTRAMUSCULAR | Status: DC | PRN
  Administered 2018-05-17 (×2): 50 ug via INTRAVENOUS

## 2018-05-17 MED ORDER — 0.9 % SODIUM CHLORIDE (POUR BTL) OPTIME
TOPICAL | Status: DC | PRN
  Administered 2018-05-17: 1000 mL

## 2018-05-17 MED ORDER — ROCURONIUM BROMIDE 10 MG/ML (PF) SYRINGE
PREFILLED_SYRINGE | INTRAVENOUS | Status: DC | PRN
  Administered 2018-05-17: 50 mg via INTRAVENOUS

## 2018-05-17 MED ORDER — DEXAMETHASONE SODIUM PHOSPHATE 10 MG/ML IJ SOLN
INTRAMUSCULAR | Status: AC
  Filled 2018-05-17: qty 1

## 2018-05-17 MED ORDER — KETOROLAC TROMETHAMINE 30 MG/ML IJ SOLN
INTRAMUSCULAR | Status: AC
  Filled 2018-05-17: qty 1

## 2018-05-17 MED ORDER — KETOROLAC TROMETHAMINE 30 MG/ML IJ SOLN
INTRAMUSCULAR | Status: DC | PRN
  Administered 2018-05-17: 30 mg via INTRAVENOUS

## 2018-05-17 MED ORDER — SODIUM CHLORIDE 0.9 % IV SOLN
INTRAVENOUS | Status: DC | PRN
  Administered 2018-05-17: 30 ug/min via INTRAVENOUS

## 2018-05-17 MED ORDER — FENTANYL CITRATE (PF) 250 MCG/5ML IJ SOLN
INTRAMUSCULAR | Status: AC
  Filled 2018-05-17: qty 5

## 2018-05-17 MED ORDER — ONDANSETRON HCL 4 MG/2ML IJ SOLN: 4.0000 mg | Freq: Four times a day (QID) | INTRAMUSCULAR | Status: DC | PRN

## 2018-05-17 MED ORDER — SODIUM CHLORIDE 0.9% FLUSH
3.0000 mL | Freq: Two times a day (BID) | INTRAVENOUS | Status: DC
  Administered 2018-05-17 (×2): 3 mL via INTRAVENOUS

## 2018-05-17 MED ORDER — ONDANSETRON HCL 4 MG PO TABS: 4.0000 mg | ORAL_TABLET | Freq: Four times a day (QID) | ORAL | Status: DC | PRN

## 2018-05-17 MED ORDER — ONDANSETRON HCL 4 MG/2ML IJ SOLN
INTRAMUSCULAR | Status: DC | PRN
  Administered 2018-05-17: 4 mg via INTRAVENOUS

## 2018-05-17 MED ORDER — LACTATED RINGERS IV SOLN
INTRAVENOUS | Status: DC
  Administered 2018-05-17: 11:00:00 via INTRAVENOUS

## 2018-05-17 MED ORDER — NAPROXEN SODIUM 220 MG PO TABS: 440.0000 mg | ORAL_TABLET | Freq: Two times a day (BID) | ORAL | Status: DC | PRN

## 2018-05-17 MED ORDER — PHENYLEPHRINE HCL 10 MG/ML IJ SOLN
INTRAMUSCULAR | Status: DC | PRN
  Administered 2018-05-17: 80 ug via INTRAVENOUS
  Administered 2018-05-17: 120 ug via INTRAVENOUS
  Administered 2018-05-17: 80 ug via INTRAVENOUS

## 2018-05-17 MED ORDER — SUGAMMADEX SODIUM 200 MG/2ML IV SOLN
INTRAVENOUS | Status: DC | PRN
  Administered 2018-05-17: 225.6 mg via INTRAVENOUS

## 2018-05-17 MED ORDER — PHENOL 1.4 % MT LIQD: 1.0000 | OROMUCOSAL | Status: DC | PRN

## 2018-05-17 MED ORDER — HYDROCODONE-ACETAMINOPHEN 5-325 MG PO TABS: 1.0000 | ORAL_TABLET | ORAL | Status: DC | PRN

## 2018-05-17 MED ORDER — BUPIVACAINE HCL (PF) 0.25 % IJ SOLN
INTRAMUSCULAR | Status: AC
  Filled 2018-05-17: qty 30

## 2018-05-17 MED ORDER — FENTANYL CITRATE (PF) 100 MCG/2ML IJ SOLN: 25.0000 ug | INTRAMUSCULAR | Status: DC | PRN

## 2018-05-17 MED ORDER — GABAPENTIN 300 MG PO CAPS
300.0000 mg | ORAL_CAPSULE | Freq: Three times a day (TID) | ORAL | Status: DC
  Administered 2018-05-17 – 2018-05-18 (×3): 300 mg via ORAL
  Filled 2018-05-17 (×3): qty 1

## 2018-05-17 MED ORDER — KETOROLAC TROMETHAMINE 15 MG/ML IJ SOLN
30.0000 mg | Freq: Four times a day (QID) | INTRAMUSCULAR | Status: AC
  Administered 2018-05-17 – 2018-05-18 (×3): 30 mg via INTRAVENOUS
  Filled 2018-05-17 (×3): qty 2

## 2018-05-17 MED ORDER — FERROUS SULFATE 325 (65 FE) MG PO TABS
325.0000 mg | ORAL_TABLET | Freq: Every day | ORAL | Status: DC
  Administered 2018-05-17: 325 mg via ORAL
  Filled 2018-05-17: qty 1

## 2018-05-17 MED ORDER — PHENYLEPHRINE 40 MCG/ML (10ML) SYRINGE FOR IV PUSH (FOR BLOOD PRESSURE SUPPORT)
PREFILLED_SYRINGE | INTRAVENOUS | Status: DC | PRN
  Administered 2018-05-17: 120 ug via INTRAVENOUS

## 2018-05-17 MED ORDER — PREDNISONE 5 MG PO TABS
5.0000 mg | ORAL_TABLET | Freq: Two times a day (BID) | ORAL | Status: DC
  Administered 2018-05-17 – 2018-05-18 (×3): 5 mg via ORAL
  Filled 2018-05-17 (×4): qty 1

## 2018-05-17 MED ORDER — DEXAMETHASONE SODIUM PHOSPHATE 10 MG/ML IJ SOLN
INTRAMUSCULAR | Status: DC | PRN
  Administered 2018-05-17: 10 mg via INTRAVENOUS

## 2018-05-17 MED ORDER — THROMBIN 5000 UNITS EX SOLR
CUTANEOUS | Status: AC
  Filled 2018-05-17: qty 10000

## 2018-05-17 MED ORDER — HEMOSTATIC AGENTS (NO CHARGE) OPTIME
TOPICAL | Status: DC | PRN
  Administered 2018-05-17: 1 via TOPICAL

## 2018-05-17 MED ORDER — SODIUM CHLORIDE 0.9% FLUSH: 3.0000 mL | INTRAVENOUS | Status: DC | PRN

## 2018-05-17 MED ORDER — ONDANSETRON HCL 4 MG/2ML IJ SOLN: 4.0000 mg | Freq: Once | INTRAMUSCULAR | Status: DC | PRN

## 2018-05-17 MED ORDER — CEFAZOLIN SODIUM-DEXTROSE 1-4 GM/50ML-% IV SOLN
1.0000 g | Freq: Three times a day (TID) | INTRAVENOUS | Status: AC
  Administered 2018-05-17 (×2): 1 g via INTRAVENOUS
  Filled 2018-05-17 (×2): qty 50

## 2018-05-17 MED ORDER — CEFAZOLIN SODIUM-DEXTROSE 2-3 GM-%(50ML) IV SOLR
INTRAVENOUS | Status: DC | PRN
  Administered 2018-05-17: 2 g via INTRAVENOUS

## 2018-05-17 MED ORDER — THROMBIN 5000 UNITS EX SOLR
CUTANEOUS | Status: DC | PRN
  Administered 2018-05-17 (×2): 5000 [IU] via TOPICAL

## 2018-05-17 MED ORDER — NAPROXEN 250 MG PO TABS: 250.0000 mg | ORAL_TABLET | Freq: Two times a day (BID) | ORAL | Status: DC | PRN

## 2018-05-17 MED ORDER — ACETAMINOPHEN 650 MG RE SUPP: 650.0000 mg | RECTAL | Status: DC | PRN

## 2018-05-17 MED ORDER — ACETAMINOPHEN 325 MG PO TABS: 650.0000 mg | ORAL_TABLET | ORAL | Status: DC | PRN

## 2018-05-17 MED ORDER — CYCLOBENZAPRINE HCL 10 MG PO TABS: 10.0000 mg | ORAL_TABLET | Freq: Three times a day (TID) | ORAL | Status: DC | PRN

## 2018-05-17 MED ORDER — CEFAZOLIN SODIUM-DEXTROSE 2-4 GM/100ML-% IV SOLN
INTRAVENOUS | Status: AC
  Filled 2018-05-17: qty 100

## 2018-05-17 MED ORDER — HYDROMORPHONE HCL 2 MG PO TABS
4.0000 mg | ORAL_TABLET | ORAL | Status: DC | PRN
  Administered 2018-05-17 – 2018-05-18 (×3): 4 mg via ORAL
  Filled 2018-05-17 (×4): qty 2

## 2018-05-17 MED ORDER — LIDOCAINE 2% (20 MG/ML) 5 ML SYRINGE
INTRAMUSCULAR | Status: DC | PRN
  Administered 2018-05-17: 40 mg via INTRAVENOUS

## 2018-05-17 MED ORDER — DEXAMETHASONE SODIUM PHOSPHATE 10 MG/ML IJ SOLN: 10.0000 mg | INTRAMUSCULAR | Status: AC

## 2018-05-17 MED ORDER — MENTHOL 3 MG MT LOZG: 1.0000 | LOZENGE | OROMUCOSAL | Status: DC | PRN

## 2018-05-17 MED ORDER — LORAZEPAM 0.5 MG PO TABS: 0.5000 mg | ORAL_TABLET | Freq: Three times a day (TID) | ORAL | Status: DC | PRN

## 2018-05-17 MED ORDER — ALBUMIN HUMAN 5 % IV SOLN
INTRAVENOUS | Status: DC | PRN
  Administered 2018-05-17: 12:00:00 via INTRAVENOUS

## 2018-05-17 MED ORDER — SODIUM CHLORIDE 0.9 % IV SOLN
INTRAVENOUS | Status: DC | PRN
  Administered 2018-05-17: 12:00:00

## 2018-05-17 MED ORDER — HYDROMORPHONE HCL 1 MG/ML IJ SOLN: 1.0000 mg | INTRAMUSCULAR | Status: DC | PRN

## 2018-05-17 MED ORDER — BUPIVACAINE HCL (PF) 0.25 % IJ SOLN
INTRAMUSCULAR | Status: DC | PRN
  Administered 2018-05-17: 20 mL

## 2018-05-17 SURGICAL SUPPLY — 52 items
BAG DECANTER FOR FLEXI CONT (MISCELLANEOUS) ×3 IMPLANT
BENZOIN TINCTURE PRP APPL 2/3 (GAUZE/BANDAGES/DRESSINGS) ×3 IMPLANT
BLADE CLIPPER SURG (BLADE) IMPLANT
BUR CUTTER 7.0 ROUND (BURR) ×3 IMPLANT
CANISTER SUCT 3000ML PPV (MISCELLANEOUS) ×3 IMPLANT
CARTRIDGE OIL MAESTRO DRILL (MISCELLANEOUS) ×1 IMPLANT
CLOSURE WOUND 1/2 X4 (GAUZE/BANDAGES/DRESSINGS) ×1
DERMABOND ADVANCED (GAUZE/BANDAGES/DRESSINGS) ×2
DERMABOND ADVANCED .7 DNX12 (GAUZE/BANDAGES/DRESSINGS) ×1 IMPLANT
DIFFUSER DRILL AIR PNEUMATIC (MISCELLANEOUS) ×3 IMPLANT
DRAPE HALF SHEET 40X57 (DRAPES) IMPLANT
DRAPE LAPAROTOMY 100X72X124 (DRAPES) ×3 IMPLANT
DRAPE MICROSCOPE LEICA (MISCELLANEOUS) ×3 IMPLANT
DRAPE SURG 17X23 STRL (DRAPES) ×6 IMPLANT
DRSG OPSITE POSTOP 4X6 (GAUZE/BANDAGES/DRESSINGS) ×3 IMPLANT
DURAPREP 26ML APPLICATOR (WOUND CARE) ×3 IMPLANT
ELECT REM PT RETURN 9FT ADLT (ELECTROSURGICAL) ×3
ELECTRODE REM PT RTRN 9FT ADLT (ELECTROSURGICAL) ×1 IMPLANT
GAUZE 4X4 16PLY RFD (DISPOSABLE) IMPLANT
GAUZE SPONGE 4X4 12PLY STRL (GAUZE/BANDAGES/DRESSINGS) ×3 IMPLANT
GLOVE BIOGEL PI IND STRL 6.5 (GLOVE) ×1 IMPLANT
GLOVE BIOGEL PI IND STRL 7.5 (GLOVE) ×1 IMPLANT
GLOVE BIOGEL PI IND STRL 8 (GLOVE) ×3 IMPLANT
GLOVE BIOGEL PI INDICATOR 6.5 (GLOVE) ×2
GLOVE BIOGEL PI INDICATOR 7.5 (GLOVE) ×2
GLOVE BIOGEL PI INDICATOR 8 (GLOVE) ×6
GLOVE ECLIPSE 7.5 STRL STRAW (GLOVE) ×3 IMPLANT
GLOVE ECLIPSE 9.0 STRL (GLOVE) ×3 IMPLANT
GLOVE EXAM NITRILE LRG STRL (GLOVE) IMPLANT
GLOVE EXAM NITRILE XL STR (GLOVE) IMPLANT
GLOVE EXAM NITRILE XS STR PU (GLOVE) IMPLANT
GOWN STRL REUS W/ TWL LRG LVL3 (GOWN DISPOSABLE) IMPLANT
GOWN STRL REUS W/ TWL XL LVL3 (GOWN DISPOSABLE) ×1 IMPLANT
GOWN STRL REUS W/TWL 2XL LVL3 (GOWN DISPOSABLE) IMPLANT
GOWN STRL REUS W/TWL LRG LVL3 (GOWN DISPOSABLE)
GOWN STRL REUS W/TWL XL LVL3 (GOWN DISPOSABLE) ×2
KIT BASIN OR (CUSTOM PROCEDURE TRAY) ×3 IMPLANT
KIT TURNOVER KIT B (KITS) ×3 IMPLANT
NEEDLE HYPO 22GX1.5 SAFETY (NEEDLE) ×3 IMPLANT
NEEDLE SPNL 22GX3.5 QUINCKE BK (NEEDLE) IMPLANT
NS IRRIG 1000ML POUR BTL (IV SOLUTION) ×3 IMPLANT
OIL CARTRIDGE MAESTRO DRILL (MISCELLANEOUS) ×3
PACK LAMINECTOMY NEURO (CUSTOM PROCEDURE TRAY) ×3 IMPLANT
PAD ARMBOARD 7.5X6 YLW CONV (MISCELLANEOUS) ×9 IMPLANT
RUBBERBAND STERILE (MISCELLANEOUS) ×6 IMPLANT
SPONGE SURGIFOAM ABS GEL SZ50 (HEMOSTASIS) ×3 IMPLANT
STRIP CLOSURE SKIN 1/2X4 (GAUZE/BANDAGES/DRESSINGS) ×2 IMPLANT
SUT VIC AB 2-0 CT1 18 (SUTURE) ×3 IMPLANT
SUT VIC AB 3-0 SH 8-18 (SUTURE) ×3 IMPLANT
TOWEL GREEN STERILE (TOWEL DISPOSABLE) ×3 IMPLANT
TOWEL GREEN STERILE FF (TOWEL DISPOSABLE) ×3 IMPLANT
WATER STERILE IRR 1000ML POUR (IV SOLUTION) ×3 IMPLANT

## 2018-05-17 NOTE — Brief Op Note (Signed)
05/17/2018  12:15 PM  PATIENT:  Crystal Carlson  52 y.o. female  PRE-OPERATIVE DIAGNOSIS:  HNP  POST-OPERATIVE DIAGNOSIS:  HNP  PROCEDURE:  Procedure(s): Left Lumbar Five-Sacral One Microdiscectomy (Left)  SURGEON:  Surgeon(s) and Role:    Earnie Larsson, MD - Primary  PHYSICIAN ASSISTANT:   ASSISTANTSMearl Latin   ANESTHESIA:   general  EBL:  25 mL   BLOOD ADMINISTERED:none  DRAINS: none   LOCAL MEDICATIONS USED:  MARCAINE     SPECIMEN:  No Specimen  DISPOSITION OF SPECIMEN:  N/A  COUNTS:  YES  TOURNIQUET:  * No tourniquets in log *  DICTATION: .Dragon Dictation  PLAN OF CARE: Admit for overnight observation  PATIENT DISPOSITION:  PACU - hemodynamically stable.   Delay start of Pharmacological VTE agent (>24hrs) due to surgical blood loss or risk of bleeding: yes

## 2018-05-17 NOTE — H&P (Signed)
Crystal Carlson is an 52 y.o. female.   Chief Complaint: Left leg pain and weakness HPI: 52 year old female with metastatic necrotic cancer presents with severe left lower extremity pain weakness and multiple episodes of falling.  Work-up demonstrates evidence of a left-sided L5-S1 disc herniation with a superior fragment causing marked compression of the left L5 nerve root.  Patient has failed conservative management.  We have discussed options with both her, her family and her treating physicians.  We have decided to move forward with lumbar microdiscectomy in hopes of improving her current situation.  Past Medical History:  Diagnosis Date  . Anxiety   . Cancer of pancreas ([facility]) 05/26/2017   chemo  . Chronic pancreatitis ([facility]) 05/19/2017  . Depression   . Edema    LE  . Liver metastases ([facility]) 05/26/2017  . Microcytic anemia 05/19/2017  . Prediabetes 2016   when living in [place]    Past Surgical History:  Procedure Laterality Date  . ERCP N/A 06/09/2017   Procedure: ENDOSCOPIC RETROGRADE CHOLANGIOPANCREATOGRAPHY (ERCP);  Surgeon: Clarene Essex, MD;  Location: [facility];  Service: Endoscopy;  Laterality: N/A;  . IR FLUORO GUIDE PORT INSERTION RIGHT  06/04/2017  . IR PARACENTESIS  04/13/2018  . IR PARACENTESIS  04/30/2018  . IR PARACENTESIS  05/12/2018  . IR US GUIDE VASC ACCESS RIGHT  06/04/2017    Family History  Problem Relation Age of Onset  . Hypertension Mother   . Arthritis Mother   . Diabetes Father   . Heart disease Father        ?valvular problems  . Hyperlipidemia Son   . Diabetes Brother    Social History:  reports that she has never smoked. She has never used smokeless tobacco. She reports that she does not drink alcohol or use drugs.  Allergies: No Known Allergies  Medications Prior to Admission  Medication Sig Dispense Refill  . ferrous sulfate 325 (65 FE) MG EC tablet Take 1 tablet (325 mg total) by mouth 3 (three) times daily with meals. (Patient taking  differently: Take 325 mg by mouth daily at 2 PM. ) 90 tablet 3  . gabapentin (NEURONTIN) 300 MG capsule Take 300 mg by mouth 3 (three) times daily.  3  . HYDROmorphone (DILAUDID) 4 MG tablet Take 1-2 tablets (4-8 mg total) by mouth every 4 (four) hours as needed for severe pain. (Patient taking differently: Take 4-8 mg by mouth every 4 (four) hours as needed for severe pain (for pain). ) 30 tablet 0  . levofloxacin (LEVAQUIN) 500 MG tablet Take 1 tablet (500 mg total) by mouth daily. 30 tablet 0  . lidocaine-prilocaine (EMLA) cream Apply to port site one hour prior to use. Do not rub in. Cover with plastic. (Patient taking differently: Apply 1 application topically daily as needed (prior to chemotherapy). Apply to port site one hour prior to use. Do not rub in. Cover with plastic.) 30 g 1  . LORazepam (ATIVAN) 0.5 MG tablet Take 1 tablet (0.5 mg total) by mouth every 8 (eight) hours as needed for anxiety (or nausea). 30 tablet 0  . naproxen sodium (ALEVE) 220 MG tablet Take 440 mg by mouth 2 (two) times daily as needed (fever).    . predniSONE (DELTASONE) 5 MG tablet Take 2 tablets (10 mg total) by mouth daily with breakfast. (Patient taking differently: Take 5 mg by mouth 2 (two) times daily. ) 60 tablet 0  . hydrocortisone cream 1 % Apply 1 application topically 3 (three) times daily as  needed for itching (minor skin irritation). (Patient not taking: Reported on 05/07/2018) 30 g 0  . methylPREDNISolone (MEDROL DOSEPAK) 4 MG TBPK tablet Day 1: 8 mg PO before breakfast, 4 mg after lunch and after dinner, and 8 mg at bedtime  Day 2: 4 mg PO before breakfast, after lunch, and after dinner and 8 mg at bedtime  Day 3: 4 mg PO before breakfast, after lunch, after dinner, and at bedtime  Day 4: 4 mg PO before breakfast, after lunch, and at bedtime  Day 5: 4 mg PO before breakfast and at bedtime  Day 6: 4 mg PO before breakfast (Patient not taking: Reported on 05/07/2018) 21 tablet 0  . morphine (MS  CONTIN) 15 MG 12 hr tablet Take 1 tablet (15 mg total) by mouth every 12 (twelve) hours. (Patient not taking: Reported on 05/07/2018) 60 tablet 0    No results found for this or any previous visit (from the past 48 hour(s)). No results found.  A comprehensive review of systems was negative.  There were no vitals taken for this visit.  Patient is awake and alert.  She is oriented and appropriate.  Her speech is fluent.  Her judgment and insight are intact.  Cranial nerve function normal bilateral.  Motor examination extremities reveals significant weakness of dorsiflexion her left foot with 4-/5 left anterior tibialis and 2/5 left extensor hallucis longus.  Patient with decreased sensation in her left L5 dermatome.  Straight leg raising is strongly positive on the left negative on the right.  Reflexes hypoactive but symmetric.  No evidence of long track signs.  Examination head ears eyes nose throat is unremarkable her chest is benign.  Abdomen is swollen and somewhat boggy secondary to ascites. 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  Left L5-S1 laminotomy and microdiscectomy.  Risks and benefits of been explained.  Patient wishes to proceed.  Mallie Mussel A Luv Mish 05/17/2018, 10:21 AM

## 2018-05-17 NOTE — Evaluation (Signed)
Occupational Therapy Evaluation Patient Details Name: Crystal Carlson MRN: [id number] DOB: 1966-07-17 Today's Date: 05/17/2018    History of Present Illness Left L5-S1 laminotomy and microdiscectomy. 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 Pt fell 3 weeks ago due to RLE weakness hitting her head/eye on side of tub (only fall she reports)   Clinical Impression   This 52 yo female admitted with above presents to acute OT at a min guard A level for sit<>stand and Min A for ambulation with SPC, she reports someone will be with her at home all the time. She will benefit from one more acute OT session for make sure she understands all basic ADLs and back precautions as well as tub transfer with tub seat.     Follow Up Recommendations  No OT follow up;Supervision - Intermittent    Equipment Recommendations  Tub/shower seat       Precautions / Restrictions Precautions Precautions: Back;Fall Precaution Booklet Issued: Yes (comment) Restrictions Weight Bearing Restrictions: No      Mobility Bed Mobility Overal bed mobility: Needs Assistance Bed Mobility: Rolling;Sidelying to Sit Rolling: Supervision Sidelying to sit: Supervision(HOB flat and no rail)       General bed mobility comments: VCs for sequencing  Transfers Overall transfer level: Needs assistance Equipment used: Straight cane Transfers: Sit to/from Stand Sit to Stand: Min guard         General transfer comment: min A to ambulate with SPC    Balance Overall balance assessment: Needs assistance Sitting-balance support: No upper extremity supported;Feet supported Sitting balance-Leahy Scale: Good     Standing balance support: Single extremity supported Standing balance-Leahy Scale: Poor                             ADL either performed or assessed with clinical judgement   ADL Overall ADL's : Needs assistance/impaired Eating/Feeding: Independent;Sitting   Grooming: Set  up;Sitting Grooming Details (indicate cue type and reason): Educated on use of 2 cups for brushing teeth Upper Body Bathing: Set up;Sitting   Lower Body Bathing: Min guard;Sit to/from stand Lower Body Bathing Details (indicate cue type and reason): Pt can cross legs to get to feet Upper Body Dressing : Set up;Sitting   Lower Body Dressing: Min guard;Sit to/from stand Lower Body Dressing Details (indicate cue type and reason): Pt can cross legs to get to feet Toilet Transfer: Minimal assistance;Ambulation;Grab bars;Comfort height toilet Toilet Transfer Details (indicate cue type and reason): SPC Toileting- Clothing Manipulation and Hygiene: Min guard;Sit to/from stand Toileting - Clothing Manipulation Details (indicate cue type and reason): VCs to not bed; educated on use of wet wipes for back peri care       General ADL Comments: Educated to not sit for more than 20-30 minutes at a time for now, then get up and walk around with A then can sit for another 20-30 minutes     Vision Patient Visual Report: No change from baseline              Pertinent Vitals/Pain Pain Assessment: 0-10 Pain Score: 4  Pain Location: stomach (sore); incision site (burning) Pain Descriptors / Indicators: Sore;Burning Pain Intervention(s): Limited activity within patient's tolerance;Monitored during session;Patient requesting pain meds-RN notified     Hand Dominance Right   Extremity/Trunk Assessment Upper Extremity Assessment Upper Extremity Assessment: Overall WFL for tasks assessed           Communication Communication Communication: No difficulties  Cognition Arousal/Alertness: Awake/alert Behavior During Therapy: WFL for tasks assessed/performed Overall Cognitive Status: Within Functional Limits for tasks assessed                                                Home Living Family/patient expects to be discharged to:: Private residence Living Arrangements:  Parent Available Help at Discharge: Family;Available 24 hours/day Type of Home: House Home Access: Other (comment)(threshold step)     Home Layout: Two level;Bed/bath upstairs Alternate Level Stairs-Number of Steps: 2 steps turn 8 steps Alternate Level Stairs-Rails: Right Bathroom Shower/Tub: Tub/shower unit;Curtain   Bathroom Toilet: Standard Bathroom Accessibility: Yes   Home Equipment: Cane - single point          Prior Functioning/Environment Level of Independence: Independent                 OT Problem List: Impaired balance (sitting and/or standing);Pain      OT Treatment/Interventions: Self-care/ADL training;Balance training;DME and/or AE instruction;Patient/family education    OT Goals(Current goals can be found in the care plan section) Acute Rehab OT Goals Patient Stated Goal: home tomorrow OT Goal Formulation: With patient Time For Goal Achievement: 05/24/18 Potential to Achieve Goals: Good  OT Frequency: Min 2X/week              AM-PAC PT "6 Clicks" Daily Activity     Outcome Measure Help from another person eating meals?: None Help from another person taking care of personal grooming?: A Little Help from another person toileting, which includes using toliet, bedpan, or urinal?: A Little Help from another person bathing (including washing, rinsing, drying)?: A Little Help from another person to put on and taking off regular upper body clothing?: A Little Help from another person to put on and taking off regular lower body clothing?: A Little 6 Click Score: 19   End of Session Equipment Utilized During Treatment: [facility]) Nurse Communication: Mobility status  Activity Tolerance: Patient tolerated treatment well Patient left: in chair;with call bell/phone within reach  OT Visit Diagnosis: Unsteadiness on feet (R26.81);Pain Pain - part of body: (stomach and incision site)                Time: 7510-2585 OT Time Calculation (min): 35 min Charges:   OT General Charges $OT Visit: 1 Visit OT Evaluation $OT Eval Moderate Complexity: 1 Mod OT Treatments $Self Care/Home Management : 8-22 mins  Crystal Carlson, OTR/L Acute [employer] Pager [phone] Office [phone]

## 2018-05-17 NOTE — Anesthesia Postprocedure Evaluation (Signed)
Anesthesia Post Note  Patient: Crystal Carlson  Procedure(s) Performed: Left Lumbar Five-Sacral One Microdiscectomy (Left Spine Lumbar)     Patient location during evaluation: PACU Anesthesia Type: General Level of consciousness: awake and alert Pain management: pain level controlled Vital Signs Assessment: post-procedure vital signs reviewed and stable Respiratory status: spontaneous breathing, nonlabored ventilation, respiratory function stable and patient connected to nasal cannula oxygen Cardiovascular status: blood pressure returned to baseline and stable Postop Assessment: no apparent nausea or vomiting Anesthetic complications: no    Last Vitals:  Vitals:   05/17/18 1359 05/17/18 1608  BP: 121/87 (!) 85/68  Pulse: 74 79  Resp: 16 16  Temp: (!) 36.4 C 36.4 C  SpO2: 97% 100%    Last Pain:  Vitals:   05/17/18 1608  TempSrc: Oral  PainSc:                  Baraa Tubbs COKER

## 2018-05-17 NOTE — Anesthesia Preprocedure Evaluation (Addendum)
Anesthesia Evaluation  Patient identified by MRN, date of birth, ID band Patient awake    Reviewed: Allergy & Precautions, NPO status , Patient's Chart, lab work & pertinent test results  Airway Mallampati: II  TM Distance: >3 FB Neck ROM: Full    Dental  (+) Dental Advisory Given, Teeth Intact, Missing   Pulmonary    breath sounds clear to auscultation       Cardiovascular hypertension,  Rhythm:Regular Rate:Normal     Neuro/Psych Anxiety Depression    GI/Hepatic Pancreatic cancer with liver metastasis Chronic pancreatitis   Endo/Other    Renal/GU      Musculoskeletal   Abdominal   Peds  Hematology  (+) anemia ,   Anesthesia Other Findings   Reproductive/Obstetrics                          Anesthesia Physical Anesthesia Plan  ASA: III  Anesthesia Plan: General   Post-op Pain Management:    Induction: Intravenous  PONV Risk Score and Plan: 3 and Ondansetron, Dexamethasone and Treatment may vary due to age or medical condition  Airway Management Planned: Oral ETT  Additional Equipment:   Intra-op Plan:   Post-operative Plan: Extubation in OR  Informed Consent: I have reviewed the patients History and Physical, chart, labs and discussed the procedure including the risks, benefits and alternatives for the proposed anesthesia with the patient or authorized representative who has indicated his/her understanding and acceptance.   Dental advisory given  Plan Discussed with: Anesthesiologist and Surgeon  Anesthesia Plan Comments:         Anesthesia Quick Evaluation

## 2018-05-17 NOTE — Anesthesia Procedure Notes (Addendum)
Procedure Name: Intubation Date/Time: 05/17/2018 11:14 AM Performed by: Wilburn Cornelia, CRNA Pre-anesthesia Checklist: Patient identified, Emergency Drugs available, Suction available, Timeout performed and Patient being monitored Patient Re-evaluated:Patient Re-evaluated prior to induction Oxygen Delivery Method: Circle system utilized Preoxygenation: Pre-oxygenation with 100% oxygen Induction Type: IV induction Ventilation: Mask ventilation without difficulty Grade View: Grade I Tube type: Oral Tube size: 6.5 mm Number of attempts: 1 Airway Equipment and Method: Video-laryngoscopy and Stylet Placement Confirmation: ETT inserted through vocal cords under direct vision,  positive ETCO2,  CO2 detector and breath sounds checked- equal and bilateral Secured at: 21 cm Tube secured with: Tape Dental Injury: Teeth and Oropharynx as per pre-operative assessment

## 2018-05-17 NOTE — Op Note (Signed)
Date of procedure: 05/17/2018  Date of dictation: Same  Service: Neurosurgery  Preoperative diagnosis: Left L5-S1 herniated nucleus pulposus with radiculopathy  Postoperative diagnosis: Same  Procedure Name: Left L5-S1 laminotomy and microdiscectomy  Surgeon:Sayvon Arterberry A.Edu On, M.D.  Asst. Surgeon: Reinaldo Meeker, NP  Anesthesia: General  Indication: 52 year old female with metastatic pancreatic cancer presents with severe left lower extremity pain paresthesias and weakness.  Work-up demonstrates evidence of a large left-sided L5-S1 superiorly migrated disc herniation with marked compression of the left L5 nerve root.  Patient presents now for laminotomy and microdiscectomy in hopes of improving her symptoms.  Operative note: After induction of anesthesia, patient position prone on the Wilson frame and appropriately padded.  Lumbar region prepped and draped sterilely.  Incision made overlying L5-S1.  Dissection performed on the left.  Retractor placed.  X-ray taken.  Level confirmed.  Laminotomy then performed using high-speed drill and Kerrison Rogers.  Ligament flavum elevated and resected.  Underlying thecal sac and exiting left-sided L5 and S1 nerve roots identified.  Microscope brought in field used micro dissection of the spinal canal.  Epidural venous plexus was coagulated and cut.  While working in the axilla of the right-sided L5 nerve root a large amount of free ruptured disc material was dissected free and removed and several large fragments.  This fully decompressed the nerve root.  I inspected the annulus itself.  It was not otherwise torn.  He did not feel that cutting into the disc space proper would be of benefit.  At this point a very thorough decompression of been achieved.  There was no evidence of injury to the thecal sac or nerve roots.  Wound is then irrigated fanlike solution.  Gelfoam was placed topically for hemostasis.  Wounds and closed in layers with Vicryl sutures.  Steri-Strips  and sterile dressing were applied.  No apparent complications.  Patient tolerated the procedure well and she returns to the recovery room postop.

## 2018-05-17 NOTE — Transfer of Care (Signed)
Immediate Anesthesia Transfer of Care Note  Patient: Crystal Carlson  Procedure(s) Performed: Left Lumbar Five-Sacral One Microdiscectomy (Left Spine Lumbar)  Patient Location: PACU  Anesthesia Type:General  Level of Consciousness: awake  Airway & Oxygen Therapy: Patient Spontanous Breathing and Patient connected to nasal cannula oxygen  Post-op Assessment: Report given to RN, Post -op Vital signs reviewed and stable and Patient moving all extremities  Post vital signs: Reviewed and stable  Last Vitals:  Vitals Value Taken Time  BP 113/82 05/17/2018 12:27 PM  Temp    Pulse 85 05/17/2018 12:31 PM  Resp 17 05/17/2018 12:31 PM  SpO2 100 % 05/17/2018 12:31 PM  Vitals shown include unvalidated device data.  Last Pain:  Vitals:   05/17/18 1040  TempSrc: Oral  PainSc:          Complications: No apparent anesthesia complications

## 2018-05-18 ENCOUNTER — Encounter (HOSPITAL_COMMUNITY): Payer: Self-pay | Admitting: Neurosurgery

## 2018-05-18 DIAGNOSIS — M5117 Intervertebral disc disorders with radiculopathy, lumbosacral region: Secondary | ICD-10-CM | POA: Diagnosis not present

## 2018-05-18 NOTE — Progress Notes (Signed)
Patient is discharged from room 3C02 at this time. Alert and in stable condition. IV site d/c'd and instructions read to patient and family with understanding verbalized. Left unit via wheelchair with all belongings at side. 

## 2018-05-18 NOTE — Discharge Instructions (Signed)

## 2018-05-18 NOTE — Progress Notes (Signed)
Occupational Therapy Treatment and Discharge Patient Details Name: Crystal Carlson MRN: [id number] DOB: 07-06-1966 Today's Date: 05/18/2018    History of present illness Left L5-S1 laminotomy and microdiscectomy. 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 Pt fell 3 weeks ago due to RLE weakness hitting her head/eye on side of tub (only fall she reports)   OT comments  This 52 yo female admitted with above seen today for tub transfer and to make sure pt remembered about the basic ADLs she was instructed in yesterday. No further OT needs, we will sign off.  Follow Up Recommendations  No OT follow up;Supervision - Intermittent    Equipment Recommendations  Tub/shower seat       Precautions / Restrictions Precautions Precautions: Back;Fall Precaution Booklet Issued: Yes (comment) Precaution Comments: Reviewed precautions verbally during functional mobility. Pt required continued cues to avoid bending with front peri care.  Required Braces or Orthoses: ("No brace required" order) Restrictions Weight Bearing Restrictions: No       Mobility Bed Mobility Overal bed mobility: Needs Assistance Bed Mobility: Rolling;Sidelying to Sit Rolling: Modified independent (Device/Increase time) Sidelying to sit: Modified independent (Device/Increase time)       General bed mobility comments: Pt up in recliner upon arrival  Transfers Overall transfer level: Needs assistance Equipment used: Rolling walker (2 wheeled) Transfers: Sit to/from Stand Sit to Stand: Supervision         General transfer comment: VC's for hand placement on seated surface for safety. No assist to power up to full stand.    Balance Overall balance assessment: Needs assistance Sitting-balance support: No upper extremity supported;Feet supported Sitting balance-Leahy Scale: Good     Standing balance support: Single extremity supported Standing balance-Leahy Scale: Poor                              ADL either performed or assessed with clinical judgement   ADL         Grooming Details (indicate cue type and reason): Pt required VCs to recall about using 2 cups for brushing teeth       Lower Body Bathing Details (indicate cue type and reason): pt recalls she has to cross her legs to get to her feet       Lower Body Dressing Details (indicate cue type and reason): pt recalls she has to cross her legs to get to her feet Toilet Transfer: Supervision/safety;Ambulation;RW;Comfort height toilet;Grab bars Toilet Transfer Details (indicate cue type and reason): Pt denies the need for a 3n1 (reports her toilet is higher than this at home) Toileting- Water quality scientist and Hygiene: Supervision/safety;Sit to/from stand Toileting - Clothing Manipulation Details (indicate cue type and reason): VCs to not bend Tub/ Shower Transfer: Tub Mining engineer Details (indicate cue type and reason): side step with min guard A simulated over small trashcan turned sideways         Vision Patient Visual Report: Central vision impairment            Cognition Arousal/Alertness: Awake/alert Behavior During Therapy: WFL for tasks assessed/performed Overall Cognitive Status: Within Functional Limits for tasks assessed                                                     Pertinent Vitals/ Pain  Pain Assessment: 0-10 Pain Score: 4  Faces Pain Scale: Hurts even more Pain Location: stomach (sore); incision site (burning) Pain Descriptors / Indicators: Sore;Burning Pain Intervention(s): Monitored during session  Home Living Family/patient expects to be discharged to:: Private residence Living Arrangements: Parent Available Help at Discharge: Family;Available 24 hours/day Type of Home: House Home Access: Other (comment)(threshold step)     Home Layout: Two level;Bed/bath upstairs Alternate Level Stairs-Number of  Steps: 2 steps turn 8 steps Alternate Level Stairs-Rails: Right Bathroom Shower/Tub: Tub/shower unit;Curtain   Bathroom Toilet: Standard Bathroom Accessibility: Yes   Home Equipment: Cane - single point          Prior Functioning/Environment Level of Independence: Independent            Frequency  Min 2X/week        Progress Toward Goals  OT Goals(current goals can now be found in the care plan section)  Progress towards OT goals: (All education completed)  Acute Rehab OT Goals Patient Stated Goal: home tomorrow  Plan Discharge plan remains appropriate       AM-PAC PT "6 Clicks" Daily Activity     Outcome Measure   Help from another person eating meals?: None Help from another person taking care of personal grooming?: A Little Help from another person toileting, which includes using toliet, bedpan, or urinal?: A Little Help from another person bathing (including washing, rinsing, drying)?: A Little Help from another person to put on and taking off regular upper body clothing?: A Little Help from another person to put on and taking off regular lower body clothing?: A Little 6 Click Score: 19    End of Session    OT Visit Diagnosis: Unsteadiness on feet (R26.81);Pain Pain - part of body: (stomach and incision site)   Activity Tolerance Patient tolerated treatment well   Patient Left in chair;with call bell/phone within reach;with family/visitor present   Nurse Communication (tub seat written on communication sheet)        Time: 7353-2992 OT Time Calculation (min): 15 min  Charges: OT General Charges $OT Visit: 1 Visit OT Treatments $Self Care/Home Management : 8-22 mins [place], OTR/L [employer] Pager [phone] Office [phone]

## 2018-05-18 NOTE — Evaluation (Signed)
Physical Therapy Evaluation Patient Details Name: Crystal Carlson MRN: [id number] DOB: 1966/02/24 Today's Date: 05/18/2018   History of Present Illness  Left L5-S1 laminotomy and microdiscectomy. 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 Pt fell 3 weeks ago due to RLE weakness hitting her head/eye on side of tub (only fall she reports)  Clinical Impression  Pt admitted with above diagnosis. Pt currently with functional limitations due to the deficits listed below (see PT Problem List). At the time of PT eval pt was able to perform transfers and ambulation with gross modified independence to supervision for safety. Pt was instructed on stair negotiation as well and required min assist for balance support and safety. Pt will have mother to assist her at home as needed. Pt was also educated on car transfer, safe activity progression, and positioning recommendations at home. Pt will benefit from skilled PT to increase their independence and safety with mobility to allow discharge to the venue listed below.       Follow Up Recommendations No PT follow up;Supervision - Intermittent    Equipment Recommendations  Rolling walker with 5" wheels    Recommendations for Other Services       Precautions / Restrictions Precautions Precautions: Back;Fall Precaution Booklet Issued: Yes (comment) Precaution Comments: Reviewed precautions verbally during functional mobility. Pt required continued cues to avoid twisting throughout session.  Required Braces or Orthoses: ("No brace required" order) Restrictions Weight Bearing Restrictions: No      Mobility  Bed Mobility Overal bed mobility: Needs Assistance Bed Mobility: Rolling;Sidelying to Sit Rolling: Modified independent (Device/Increase time) Sidelying to sit: Modified independent (Device/Increase time)       General bed mobility comments: Pt demonstrated good technique - no cues required  Transfers Overall transfer level: Needs  assistance Equipment used: Rolling walker (2 wheeled) Transfers: Sit to/from Stand Sit to Stand: Supervision         General transfer comment: VC's for hand placement on seated surface for safety. No assist to power up to full stand.  Ambulation/Gait Ambulation/Gait assistance: Supervision Gait Distance (Feet): 375 Feet Assistive device: Rolling walker (2 wheeled) Gait Pattern/deviations: Step-through pattern;Decreased stride length;Trunk flexed Gait velocity: Decreased Gait velocity interpretation: 1.31 - 2.62 ft/sec, indicative of limited community ambulator General Gait Details: VC's for improved posture throughout. Pt was able to ambulste well and maintain corrective changes throughout gait training.   Stairs Stairs: Yes Stairs assistance: Min assist Stair Management: One rail Right;Step to pattern;Forwards;Sideways Number of Stairs: 10 General stair comments: step-to ascending and sideways descending   Wheelchair Mobility    Modified Rankin (Stroke Patients Only)       Balance Overall balance assessment: Needs assistance Sitting-balance support: No upper extremity supported;Feet supported Sitting balance-Leahy Scale: Good     Standing balance support: Single extremity supported Standing balance-Leahy Scale: Poor                               Pertinent Vitals/Pain Pain Assessment: Faces Faces Pain Scale: Hurts even more Pain Location: stomach (sore); incision site (burning) Pain Descriptors / Indicators: Sore;Burning Pain Intervention(s): Monitored during session    Home Living Family/patient expects to be discharged to:: Private residence Living Arrangements: Parent Available Help at Discharge: Family;Available 24 hours/day Type of Home: House Home Access: Other (comment)(threshold step)     Home Layout: Two level;Bed/bath upstairs Home Equipment: Cane - single point      Prior Function Level of Independence: Independent  Hand Dominance   Dominant Hand: Right    Extremity/Trunk Assessment   Upper Extremity Assessment Upper Extremity Assessment: Overall WFL for tasks assessed    Lower Extremity Assessment Lower Extremity Assessment: Generalized weakness(consistent with pre-op diagnosis)    Cervical / Trunk Assessment Cervical / Trunk Assessment: Other exceptions Cervical / Trunk Exceptions: s/p surgery  Communication   Communication: No difficulties  Cognition Arousal/Alertness: Awake/alert Behavior During Therapy: WFL for tasks assessed/performed Overall Cognitive Status: Within Functional Limits for tasks assessed                                        General Comments      Exercises     Assessment/Plan    PT Assessment Patient needs continued PT services  PT Problem List Decreased strength;Decreased range of motion;Decreased activity tolerance;Decreased balance;Decreased mobility;Decreased knowledge of use of DME;Decreased safety awareness;Decreased knowledge of precautions;Pain       PT Treatment Interventions DME instruction;Gait training;Stair training;Functional mobility training;Therapeutic activities;Therapeutic exercise;Neuromuscular re-education;Patient/family education    PT Goals (Current goals can be found in the Care Plan section)  Acute Rehab PT Goals Patient Stated Goal: home tomorrow PT Goal Formulation: With patient Time For Goal Achievement: 05/25/18 Potential to Achieve Goals: Good    Frequency Min 5X/week   Barriers to discharge        Co-evaluation               AM-PAC PT "6 Clicks" Daily Activity  Outcome Measure Difficulty turning over in bed (including adjusting bedclothes, sheets and blankets)?: None Difficulty moving from lying on back to sitting on the side of the bed? : None Difficulty sitting down on and standing up from a chair with arms (e.g., wheelchair, bedside commode, etc,.)?: A Little Help needed moving to and  from a bed to chair (including a wheelchair)?: A Little Help needed walking in hospital room?: A Little Help needed climbing 3-5 steps with a railing? : A Little 6 Click Score: 20    End of Session Equipment Utilized During Treatment: Gait belt Activity Tolerance: Patient tolerated treatment well Patient left: in chair;with call bell/phone within reach;with family/visitor present Nurse Communication: Mobility status PT Visit Diagnosis: Unsteadiness on feet (R26.81);Pain Pain - part of body: (abdomen)    Time: 0810-0829 PT Time Calculation (min) (ACUTE ONLY): 19 min   Charges:   PT Evaluation $PT Eval Moderate Complexity: 1 Mod          Crystal Carlson, PT, DPT Acute Rehabilitation Services Pager: [phone] Office: [phone]   Thelma Comp 05/18/2018, 8:53 AM

## 2018-05-18 NOTE — Discharge Summary (Signed)
Physician Discharge Summary  Patient ID: Crystal Carlson MRN: [id number] DOB/AGE: 24-Jun-1966 52 y.o.  Admit date: 05/17/2018 Discharge date: 05/18/2018  Admission Diagnoses:  Discharge Diagnoses:  Active Problems:   Lumbar radiculopathy   Discharged Condition: good  Hospital Course: Patient admitted to the hospital where she underwent uncomplicated left-sided P5-K9 laminotomy and microdiscectomy.  Postoperatively she is done very well.  Preoperative back and left lower extremity pain weakness and numbness are very much improved.  She is ambulating without difficulty.  She feels ready for discharge home.  Consults:   Significant Diagnostic Studies:   Treatments:   Discharge Exam: Blood pressure 110/72, pulse 77, temperature 97.8 F (36.6 C), temperature source Oral, resp. rate 16, height 5\' 5"  (1.651 m), weight 56.4 kg, SpO2 95 %. Awake and alert.  Oriented and appropriate.  Cranial nerve function intact.  Motor and sensory function of the extremities normal except for some mild left sided extensor hallucis longus weakness.  Preoperative anterior tibialis weakness much improved.  Wound clean and dry.  Chest and abdomen stable.  Disposition: Discharge disposition: 01-Home or Self Care        Allergies as of 05/18/2018      Reactions   Pork-derived Products    RELIGIOUS BELIEF -  NO PORK      Medication List    TAKE these medications   ferrous sulfate 325 (65 FE) MG EC tablet Take 1 tablet (325 mg total) by mouth 3 (three) times daily with meals. What changed:  when to take this   gabapentin 300 MG capsule Commonly known as:  NEURONTIN Take 300 mg by mouth 3 (three) times daily.   hydrocortisone cream 1 % Apply 1 application topically 3 (three) times daily as needed for itching (minor skin irritation).   HYDROmorphone 4 MG tablet Commonly known as:  DILAUDID Take 1-2 tablets (4-8 mg total) by mouth every 4 (four) hours as needed for severe pain. What changed:  reasons  to take this   levofloxacin 500 MG tablet Commonly known as:  LEVAQUIN Take 1 tablet (500 mg total) by mouth daily.   lidocaine-prilocaine cream Commonly known as:  EMLA Apply to port site one hour prior to use. Do not rub in. Cover with plastic. What changed:    how much to take  how to take this  when to take this  reasons to take this   LORazepam 0.5 MG tablet Commonly known as:  ATIVAN Take 1 tablet (0.5 mg total) by mouth every 8 (eight) hours as needed for anxiety (or nausea).   methylPREDNISolone 4 MG Tbpk tablet Commonly known as:  MEDROL DOSEPAK Day 1: 8 mg PO before breakfast, 4 mg after lunch and after dinner, and 8 mg at bedtime  Day 2: 4 mg PO before breakfast, after lunch, and after dinner and 8 mg at bedtime  Day 3: 4 mg PO before breakfast, after lunch, after dinner, and at bedtime  Day 4: 4 mg PO before breakfast, after lunch, and at bedtime  Day 5: 4 mg PO before breakfast and at bedtime  Day 6: 4 mg PO before breakfast   morphine 15 MG 12 hr tablet Commonly known as:  MS CONTIN Take 1 tablet (15 mg total) by mouth every 12 (twelve) hours.   naproxen sodium 220 MG tablet Commonly known as:  ALEVE Take 440 mg by mouth 2 (two) times daily as needed (fever).   predniSONE 5 MG tablet Commonly known as:  DELTASONE Take 2 tablets (10 mg  total) by mouth daily with breakfast. What changed:    how much to take  when to take this            Durable Medical Equipment  (From admission, onward)         Start     Ordered   05/17/18 1600  For home use only DME Shower stool  Once     05/17/18 1559           Signed: Mallie Mussel A Gary Bultman 05/18/2018, 10:26 AM

## 2018-05-21 ENCOUNTER — Encounter: Payer: Self-pay | Admitting: Oncology

## 2018-05-24 ENCOUNTER — Other Ambulatory Visit: Payer: Self-pay | Admitting: *Deleted

## 2018-05-24 DIAGNOSIS — C259 Malignant neoplasm of pancreas, unspecified: Secondary | ICD-10-CM

## 2018-05-24 DIAGNOSIS — C787 Secondary malignant neoplasm of liver and intrahepatic bile duct: Principal | ICD-10-CM

## 2018-05-25 ENCOUNTER — Other Ambulatory Visit: Payer: Self-pay | Admitting: *Deleted

## 2018-05-25 DIAGNOSIS — R1900 Intra-abdominal and pelvic swelling, mass and lump, unspecified site: Secondary | ICD-10-CM

## 2018-05-26 ENCOUNTER — Ambulatory Visit (HOSPITAL_COMMUNITY)
Admission: RE | Admit: 2018-05-26 | Discharge: 2018-05-26 | Disposition: A | Discharge status: Home / Self Care | Payer: Medicaid Other | Source: Ambulatory Visit | Attending: Oncology | Admitting: Oncology

## 2018-05-26 ENCOUNTER — Inpatient Hospital Stay: Payer: Medicaid Other | Attending: Nurse Practitioner | Admitting: Oncology

## 2018-05-26 ENCOUNTER — Inpatient Hospital Stay: Payer: Medicaid Other

## 2018-05-26 ENCOUNTER — Other Ambulatory Visit: Payer: Self-pay | Admitting: *Deleted

## 2018-05-26 VITALS — BP 124/89 | HR 87 | Temp 98.9°F | Resp 18 | Ht 65.0 in | Wt 136.1 lb

## 2018-05-26 DIAGNOSIS — R634 Abnormal weight loss: Secondary | ICD-10-CM | POA: Diagnosis not present

## 2018-05-26 DIAGNOSIS — Z5111 Encounter for antineoplastic chemotherapy: Secondary | ICD-10-CM | POA: Diagnosis not present

## 2018-05-26 DIAGNOSIS — K8681 Exocrine pancreatic insufficiency: Secondary | ICD-10-CM | POA: Insufficient documentation

## 2018-05-26 DIAGNOSIS — D509 Iron deficiency anemia, unspecified: Secondary | ICD-10-CM | POA: Diagnosis not present

## 2018-05-26 DIAGNOSIS — Z86718 Personal history of other venous thrombosis and embolism: Secondary | ICD-10-CM | POA: Insufficient documentation

## 2018-05-26 DIAGNOSIS — G893 Neoplasm related pain (acute) (chronic): Secondary | ICD-10-CM | POA: Diagnosis not present

## 2018-05-26 DIAGNOSIS — C257 Malignant neoplasm of other parts of pancreas: Secondary | ICD-10-CM | POA: Diagnosis not present

## 2018-05-26 DIAGNOSIS — D61810 Antineoplastic chemotherapy induced pancytopenia: Secondary | ICD-10-CM

## 2018-05-26 DIAGNOSIS — C787 Secondary malignant neoplasm of liver and intrahepatic bile duct: Secondary | ICD-10-CM | POA: Insufficient documentation

## 2018-05-26 DIAGNOSIS — C259 Malignant neoplasm of pancreas, unspecified: Secondary | ICD-10-CM

## 2018-05-26 DIAGNOSIS — R21 Rash and other nonspecific skin eruption: Secondary | ICD-10-CM | POA: Insufficient documentation

## 2018-05-26 DIAGNOSIS — R180 Malignant ascites: Secondary | ICD-10-CM | POA: Diagnosis not present

## 2018-05-26 DIAGNOSIS — R1900 Intra-abdominal and pelvic swelling, mass and lump, unspecified site: Secondary | ICD-10-CM | POA: Diagnosis present

## 2018-05-26 LAB — CBC WITH DIFFERENTIAL (CANCER CENTER ONLY)
Abs Immature Granulocytes: 0.07 10*3/uL (ref 0.00–0.07)
BASOS PCT: 0 %
Basophils Absolute: 0 10*3/uL (ref 0.0–0.1)
EOS PCT: 1 %
Eosinophils Absolute: 0.1 10*3/uL (ref 0.0–0.5)
HCT: 28.5 % — ABNORMAL LOW (ref 36.0–46.0)
Hemoglobin: 8.6 g/dL — ABNORMAL LOW (ref 12.0–15.0)
Immature Granulocytes: 1 %
Lymphocytes Relative: 10 %
Lymphs Abs: 0.9 10*3/uL (ref 0.7–4.0)
MCH: 24.8 pg — ABNORMAL LOW (ref 26.0–34.0)
MCHC: 30.2 g/dL (ref 30.0–36.0)
MCV: 82.1 fL (ref 80.0–100.0)
Monocytes Absolute: 1 10*3/uL (ref 0.1–1.0)
Monocytes Relative: 10 %
Neutro Abs: 7.4 10*3/uL (ref 1.7–7.7)
Neutrophils Relative %: 78 %
PLATELETS: 183 10*3/uL (ref 150–400)
RBC: 3.47 MIL/uL — AB (ref 3.87–5.11)
RDW: 21 % — ABNORMAL HIGH (ref 11.5–15.5)
WBC: 9.5 10*3/uL (ref 4.0–10.5)
nRBC: 0 % (ref 0.0–0.2)

## 2018-05-26 LAB — CMP (CANCER CENTER ONLY)
ALT: 17 U/L (ref 0–44)
ANION GAP: 7 (ref 5–15)
AST: 33 U/L (ref 15–41)
Albumin: 2.1 g/dL — ABNORMAL LOW (ref 3.5–5.0)
Alkaline Phosphatase: 248 U/L — ABNORMAL HIGH (ref 38–126)
BUN: 16 mg/dL (ref 6–20)
CALCIUM: 8.6 mg/dL — AB (ref 8.9–10.3)
CHLORIDE: 108 mmol/L (ref 98–111)
CO2: 26 mmol/L (ref 22–32)
Creatinine: 0.71 mg/dL (ref 0.44–1.00)
GFR, Estimated: >60 mL/min (ref >60)
Glucose, Bld: 113 mg/dL — ABNORMAL HIGH (ref 70–99)
Potassium: 3.9 mmol/L (ref 3.5–5.1)
SODIUM: 141 mmol/L (ref 135–145)
Total Bilirubin: 1 mg/dL (ref 0.3–1.2)
Total Protein: 6.6 g/dL (ref 6.5–8.1)

## 2018-05-26 MED ORDER — LEVOFLOXACIN 500 MG PO TABS: 500.0000 mg | ORAL_TABLET | Freq: Every day | ORAL | 0 refills | Status: DC

## 2018-05-26 MED ORDER — LIDOCAINE HCL 1 % IJ SOLN
INTRAMUSCULAR | Status: AC
  Filled 2018-05-26: qty 10

## 2018-05-26 MED ORDER — SPIRONOLACTONE 100 MG PO TABS: 100.0000 mg | ORAL_TABLET | Freq: Every day | ORAL | 3 refills | Status: DC

## 2018-05-26 MED ORDER — PREDNISONE 5 MG PO TABS: 5.0000 mg | ORAL_TABLET | Freq: Two times a day (BID) | ORAL | 1 refills | Status: DC

## 2018-05-26 MED ORDER — FUROSEMIDE 40 MG PO TABS: 40.0000 mg | ORAL_TABLET | Freq: Every day | ORAL | 3 refills | Status: DC

## 2018-05-26 MED FILL — SPIRONOLACTONE 100 MG TAB: 100 | 30 days supply | Qty: 30 | Fill #0

## 2018-05-26 MED FILL — predniSONE 5 MG TABS: 5 | 15 days supply | Qty: 30 | Fill #0

## 2018-05-26 MED FILL — FUROSEMIDE 40 MG TAB: 40 | 30 days supply | Qty: 30 | Fill #0

## 2018-05-26 NOTE — Progress Notes (Signed)
[facility] OFFICE PROGRESS NOTE   Diagnosis: Pancreas cancer  INTERVAL HISTORY:   Crystal Carlson returns as scheduled.  She was last treated with gemcitabine and Abraxane on 05/05/2018.  She reports stable neuropathy symptoms. She underwent a left L5-S1 laminotomy and microdiscectomy by Dr. Annette Stable on 05/17/2018.  She reports tolerating the procedure well.  Left leg pain has resolved.  She continues to have numbness in the left leg.  Line her chief complaint is abdominal distention.  This causes pain.  Objective:  Vital signs in last 24 hours:  Blood pressure 124/89, pulse 87, temperature 98.9 F (37.2 C), temperature source Oral, resp. rate 18, height 5\' 5"  (1.651 m), weight 136 lb 1.6 oz (61.7 kg), SpO2 99 %.    HEENT: No thrush or ulcers Resp: Clear bilaterally Cardio: Regular rate and rhythm GI: Tense distention with ascites Vascular: Trace pitting edema at the low leg and foot bilaterally Neuro: Leg strength and foot strength are intact bilaterally    Portacath/PICC-without erythema  Lab Results:  Lab Results  Component Value Date   WBC 9.5 05/26/2018   HGB 8.6 (L) 05/26/2018   HCT 28.5 (L) 05/26/2018   MCV 82.1 05/26/2018   PLT 183 05/26/2018   NEUTROABS 7.4 05/26/2018    CMP  Lab Results  Component Value Date   NA 141 05/26/2018   K 3.9 05/26/2018   CL 108 05/26/2018   CO2 26 05/26/2018   GLUCOSE 113 (H) 05/26/2018   BUN 16 05/26/2018   CREATININE 0.71 05/26/2018   CALCIUM 8.6 (L) 05/26/2018   PROT 6.6 05/26/2018   ALBUMIN 2.1 (L) 05/26/2018   AST 33 05/26/2018   ALT 17 05/26/2018   ALKPHOS 248 (H) 05/26/2018   BILITOT 1.0 05/26/2018   GFRNONAA >60 05/26/2018   GFRAA >60 05/26/2018    Lab Results  Component Value Date   CEA1 1.3 05/25/2017     Medications: I have reviewed the patient's current medications.   Assessment/Plan: 1. 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 Adenocarcinoma consistent with pancreatobiliary primary.  Cycle 1 FOLFIRINOX 06/18/2017  Cycle 2FOLFIRINOX 07/02/2017  Cycle 3 FOLFIRINOX 07/16/2017 (oxaliplatin dose reduced due to mild thrombocytopenia)  CT 07/24/2017-new splenomegaly and left hydronephrosis  Cycle 4 FOLFIRINOX 08/08/2017  Cycle 5 FOLFIRINOX 08/24/2017  CT abdomen/pelvis 09/03/2017-stable liver lesions and pancreas mass, stable right biliary dilatation, possible new 8 mm right liver lesion versus a vascular phenomena, lesion at the right paracolic gutter was present in the past-significance unclear  Cycle 6 FOLFIRINOX1/29/2019  Cycle7 FOLFIRINOX2/19/2019 (Irinotecan andOxaliplatin dose reduced due to thrombocytopenia)  CT abdomen/pelvis 10/25/2017- findings concerning for enterocolitis. No bowel obstruction. Ill-defined pancreatic head mass. Multiple hepatic hypoenhancing lesions with the largest measuring 1.9 x 2.0 cm, previously 1.7 x 1.7 cm; 9 x 10 mm hypoenhancing lesion in the inferior right lobe of the liver slightly larger compared to the previous CT; 1.6 x 2.4 cm enhancing lesion in the right paracolic gutter increased in size compared to the prior CT.  Cycle 1 gemcitabine/Abraxane 11/04/2017  Cycle 2 gemcitabine/Abraxane 11/25/2017  Cycle 3 gemcitabine/Abraxane 12/08/2017  Cycle 4 gemcitabine 12/23/2017 (Abraxane held secondary to neuropathy)  CTs abdomen/pelvis  01/12/2018- decrease in size of the pancreatic mass and hepatic metastasis; slight interval decrease in size of right paracolic gutter implant; worsening  abdominal/pelvic ascites. Bowel wall thickening likely due to ascites and low albumin. 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  Cycle 5 gemcitabine 01/13/2018 (Abraxane held secondary to neuropathy)  Cycle 6 gemcitabine/Abraxane 01/27/2018  CT abdomen/pelvis 02/01/2018- dominant metastases in the liver are unchanged, multiple new low-density lesions throughout the right lobe-small abscesses?, No change in pancreas mass  Cycle 7 gemcitabine/Abraxane 02/16/2018(gemcitabine and Abraxane dose reduced due to previous neutropenia)  Cycle 8 gemcitabine/Abraxane 03/03/2018  Cycle 9 gemcitabine/Abraxane 03/25/2018  Cycle 10 gemcitabine/Abraxane 04/14/2018  Restaging CTs 05/04/2018- similar ill-defined pancreatic mass; multiple liver metastases again noted, appears slightly improved; increase in volume of abdominal and pelvic ascites; diffuse small bowel wall edema and mild edema of the ascending colon and transverse colon.  Cycle 11 gemcitabine/Abraxane 05/05/2018    2. Pain secondary to #1.Improved.  3. Weight losssecondary to #1.  4. Microcytic anemia  Transfused with packed red blood cells 07/25/2017, 16 2019, and 11/27/2017  5. History of thrombocytopenia secondary to chemotherapy  6. Port-A-Cath placement 06/04/2017  7. Elevated liver enzymes/bilirubin10/22/2018-likely early biliary obstruction secondary to pancreas cancer, status post ERCP-placement of a common bile duct stent 06/09/2018  8.Postprandial abdominal pain. Protonix initiated 07/16/2017.Trial of Reglan initiated 08/24/2017.  9.Admission with fever 07/24/2017-no source for infection identified, resolved   10.Diarrheafollowing cycle 3 FOLFIRINOX-resolved  11.Left hydronephrosis noted on CT12/03/2017-etiology unclear, urology consulted-stent not recommended.Mild left hydronephrosis noted on CT 10/25/2017. Creatinine normal 10/27/2017.  12. Admission 08/31/2017  with a high fever, blood culture positive for gram-negative rods-Klebsiella pneumoniae identified  13.Fever 10/25/2017 status post evaluation in the emergency department. CT with findings concerning for enterocolitis.   14. Admission 11/11/2017 with a high fever-blood cultures (peripheral and Port-A-Cath) positive for gram-negative rods--STENOTROPHOMONAS MALTOPHILIA;course of Septra completed as an outpatient.  15. Rash-potentially secondary to gemcitabine or infection, improved  16. Pancytopenia secondary to chemotherapy  17.Intermittent fever. Question "tumor fever", question infection. Now on Bactrim prophylaxis, improved  18. Ascites.Paracentesis 01/14/2018- reactive mesothelial cells, mixed inflammation;culture positive for Klebsiella and enterococcustreated withAugmentin  Admission with fever 01/30/2018, blood cultures negative, discharged on ciprofloxacin/Flagyl  Recurrent ascites, paracentesis 02/23/2018-culture positive for strep viridans, placed on Levaquin  Paracentesis 03/03/2018- 2 L of fluid removed, culture and cytology negative  All of Aldactone/Lasix started 05/26/2018  19.  Status post evaluation back pain in the emergency department 04/23/2018-MRI lumbar spine with L5-S1 left subarticular disc extrusion  L5-S1 to me and microdiscectomy 05/17/2018, pain resolved    Disposition: Ms. Achenbach is symptomatic with refractory ascites.  The ascites is most likely secondary to portal hypertension.  I discussed the case with gastroenterology.  She will begin a trial of Aldactone and furosemide.  She will be scheduled for a therapeutic paracentesis today.  She would like to hold chemotherapy until she undergoes the paracentesis procedure.  She will return for an office visit and chemotherapy in 1 week.  She underwent an L5-S1 microdiscectomy last week.  The left lower extremity pain has improved.  She is scheduled to see Dr. Annette Stable tomorrow.  Betsy Coder,  MD  05/26/2018  2:15 PM

## 2018-05-26 NOTE — Procedures (Signed)
PROCEDURE SUMMARY:  Successful US guided paracentesis from right lateral abdomen.  Yielded 5.5 L of clear yellow fluid.  No immediate complications.  Patient tolerated well.   Tanara Turvey S Xylina Rhoads PA-C 05/26/2018 3:00 PM

## 2018-05-27 ENCOUNTER — Ambulatory Visit (HOSPITAL_COMMUNITY): Payer: Medicaid Other

## 2018-05-27 ENCOUNTER — Telehealth: Payer: Self-pay | Admitting: Oncology

## 2018-05-27 LAB — CANCER ANTIGEN 19-9: CA 19-9: 1169 U/mL — ABNORMAL HIGH (ref 0–35)

## 2018-05-27 NOTE — Telephone Encounter (Signed)
Spoke with patient husband - husband is aware of appt dates and time per 10/9 los.

## 2018-06-02 ENCOUNTER — Inpatient Hospital Stay: Payer: Medicaid Other

## 2018-06-02 ENCOUNTER — Encounter: Payer: Self-pay | Admitting: Nurse Practitioner

## 2018-06-02 ENCOUNTER — Inpatient Hospital Stay (HOSPITAL_BASED_OUTPATIENT_CLINIC_OR_DEPARTMENT_OTHER): Payer: Medicaid Other | Admitting: Nurse Practitioner

## 2018-06-02 ENCOUNTER — Telehealth: Payer: Self-pay | Admitting: Nurse Practitioner

## 2018-06-02 VITALS — BP 124/86 | HR 74 | Temp 98.6°F | Resp 17 | Ht 65.0 in | Wt 110.4 lb

## 2018-06-02 DIAGNOSIS — C787 Secondary malignant neoplasm of liver and intrahepatic bile duct: Principal | ICD-10-CM

## 2018-06-02 DIAGNOSIS — Z5111 Encounter for antineoplastic chemotherapy: Secondary | ICD-10-CM | POA: Diagnosis not present

## 2018-06-02 DIAGNOSIS — Z95828 Presence of other vascular implants and grafts: Secondary | ICD-10-CM

## 2018-06-02 DIAGNOSIS — D61810 Antineoplastic chemotherapy induced pancytopenia: Secondary | ICD-10-CM

## 2018-06-02 DIAGNOSIS — C257 Malignant neoplasm of other parts of pancreas: Secondary | ICD-10-CM | POA: Diagnosis not present

## 2018-06-02 DIAGNOSIS — G629 Polyneuropathy, unspecified: Secondary | ICD-10-CM

## 2018-06-02 DIAGNOSIS — C259 Malignant neoplasm of pancreas, unspecified: Secondary | ICD-10-CM

## 2018-06-02 DIAGNOSIS — G893 Neoplasm related pain (acute) (chronic): Secondary | ICD-10-CM

## 2018-06-02 DIAGNOSIS — R634 Abnormal weight loss: Secondary | ICD-10-CM

## 2018-06-02 DIAGNOSIS — R180 Malignant ascites: Secondary | ICD-10-CM

## 2018-06-02 DIAGNOSIS — D509 Iron deficiency anemia, unspecified: Secondary | ICD-10-CM

## 2018-06-02 LAB — CMP (CANCER CENTER ONLY)
ALK PHOS: 261 U/L — AB (ref 38–126)
ALT: 25 U/L (ref 0–44)
AST: 37 U/L (ref 15–41)
Albumin: 2.4 g/dL — ABNORMAL LOW (ref 3.5–5.0)
Anion gap: 9 (ref 5–15)
BILIRUBIN TOTAL: 1.5 mg/dL — AB (ref 0.3–1.2)
BUN: 12 mg/dL (ref 6–20)
CALCIUM: 9.1 mg/dL (ref 8.9–10.3)
CO2: 29 mmol/L (ref 22–32)
CREATININE: 0.79 mg/dL (ref 0.44–1.00)
Chloride: 100 mmol/L (ref 98–111)
GFR, Est AFR Am: >60 mL/min (ref >60)
Glucose, Bld: 130 mg/dL — ABNORMAL HIGH (ref 70–99)
POTASSIUM: 3.8 mmol/L (ref 3.5–5.1)
Sodium: 138 mmol/L (ref 135–145)
TOTAL PROTEIN: 7.6 g/dL (ref 6.5–8.1)

## 2018-06-02 LAB — CBC WITH DIFFERENTIAL (CANCER CENTER ONLY)
Abs Immature Granulocytes: 0.04 10*3/uL (ref 0.00–0.07)
BASOS PCT: 0 %
Basophils Absolute: 0 10*3/uL (ref 0.0–0.1)
EOS PCT: 1 %
Eosinophils Absolute: 0.1 10*3/uL (ref 0.0–0.5)
HCT: 33.4 % — ABNORMAL LOW (ref 36.0–46.0)
HEMOGLOBIN: 10 g/dL — AB (ref 12.0–15.0)
Immature Granulocytes: 0 %
Lymphocytes Relative: 11 %
Lymphs Abs: 1 10*3/uL (ref 0.7–4.0)
MCH: 24.2 pg — ABNORMAL LOW (ref 26.0–34.0)
MCHC: 29.9 g/dL — AB (ref 30.0–36.0)
MCV: 80.7 fL (ref 80.0–100.0)
MONO ABS: 1.1 10*3/uL — AB (ref 0.1–1.0)
MONOS PCT: 11 %
Neutro Abs: 7.2 10*3/uL (ref 1.7–7.7)
Neutrophils Relative %: 77 %
Platelet Count: 195 10*3/uL (ref 150–400)
RBC: 4.14 MIL/uL (ref 3.87–5.11)
RDW: 21.2 % — ABNORMAL HIGH (ref 11.5–15.5)
WBC: 9.4 10*3/uL (ref 4.0–10.5)
nRBC: 0 % (ref 0.0–0.2)

## 2018-06-02 MED ORDER — SODIUM CHLORIDE 0.9% FLUSH
10.0000 mL | INTRAVENOUS | Status: DC | PRN
  Administered 2018-06-02 (×2): 10 mL
  Filled 2018-06-02: qty 10

## 2018-06-02 MED ORDER — PACLITAXEL PROTEIN-BOUND CHEMO INJECTION 100 MG
80.0000 mg/m2 | Freq: Once | INTRAVENOUS | Status: AC
  Administered 2018-06-02: 125 mg via INTRAVENOUS
  Filled 2018-06-02: qty 25

## 2018-06-02 MED ORDER — SODIUM CHLORIDE 0.9% FLUSH
10.0000 mL | Freq: Once | INTRAVENOUS | Status: AC
  Administered 2018-06-02: 10 mL
  Filled 2018-06-02: qty 10

## 2018-06-02 MED ORDER — PROCHLORPERAZINE MALEATE 10 MG PO TABS
10.0000 mg | ORAL_TABLET | Freq: Once | ORAL | Status: AC
  Administered 2018-06-02: 10 mg via ORAL

## 2018-06-02 MED ORDER — SODIUM CHLORIDE 0.9 % IV SOLN
700.0000 mg/m2 | Freq: Once | INTRAVENOUS | Status: AC
  Administered 2018-06-02: 1102 mg via INTRAVENOUS
  Filled 2018-06-02: qty 28.98

## 2018-06-02 MED ORDER — ANTICOAGULANT SODIUM CITRATE 4% (200MG/5ML) IV SOLN
5.0000 mL | Freq: Once | Status: AC
  Administered 2018-06-02: 5 mL via INTRAVENOUS
  Filled 2018-06-02: qty 5

## 2018-06-02 MED ORDER — PROCHLORPERAZINE MALEATE 10 MG PO TABS
ORAL_TABLET | ORAL | Status: AC
  Filled 2018-06-02: qty 1

## 2018-06-02 MED ORDER — SODIUM CHLORIDE 0.9 % IV SOLN
Freq: Once | INTRAVENOUS | Status: AC
  Administered 2018-06-02: 12:00:00 via INTRAVENOUS
  Filled 2018-06-02: qty 250

## 2018-06-02 NOTE — Patient Instructions (Signed)
[facility] Discharge Instructions for Patients Receiving Chemotherapy  Today you received the following chemotherapy agents: Abraxane, Gemzar   To help prevent nausea and vomiting after your treatment, we encourage you to take your nausea medication as directed.    If you develop nausea and vomiting that is not controlled by your nausea medication, call the clinic.   BELOW ARE SYMPTOMS THAT SHOULD BE REPORTED IMMEDIATELY:  *FEVER GREATER THAN 100.5 F  *CHILLS WITH OR WITHOUT FEVER  NAUSEA AND VOMITING THAT IS NOT CONTROLLED WITH YOUR NAUSEA MEDICATION  *UNUSUAL SHORTNESS OF BREATH  *UNUSUAL BRUISING OR BLEEDING  TENDERNESS IN MOUTH AND THROAT WITH OR WITHOUT PRESENCE OF ULCERS  *URINARY PROBLEMS  *BOWEL PROBLEMS  UNUSUAL RASH Items with * indicate a potential emergency and should be followed up as soon as possible.  Feel free to call the clinic should you have any questions or concerns. The clinic phone number is [phone].  Please show the CHEMO ALERT CARD at check-in to the Emergency Department and triage nurse.   

## 2018-06-02 NOTE — Telephone Encounter (Signed)
Scheduled appt per 10/16 los - gave patient AVS and calender per los.   

## 2018-06-02 NOTE — Progress Notes (Addendum)
[place] OFFICE PROGRESS NOTE   Diagnosis: Pancreas cancer  INTERVAL HISTORY:   Crystal Carlson returns as scheduled.  She was last treated with gemcitabine/Abraxane 05/05/2018.  She underwent a paracentesis procedure 05/26/2018 with 5.5 L of fluid removed.  She begin a trial of Aldactone and furosemide 05/26/2018.  She notes increased urination and decreased abdominal distention.  She has periodic abdominal discomfort which she thinks is related to constipation.  No nausea or vomiting.  Stable neuropathy symptoms.  Back pain continues to be resolved.  Objective:  Vital signs in last 24 hours:  Blood pressure 124/86, pulse 74, temperature 98.6 F (37 C), temperature source Oral, resp. rate 17, height 5\' 5"  (1.651 m), weight 110 lb 6.4 oz (50.1 kg), SpO2 100 %.    HEENT: No thrush or ulcers. Resp: Lungs clear bilaterally. Cardio: Regular rate and rhythm. GI: Abdomen is soft, distended.  Exam consistent with ascites. Vascular: No leg edema.  Port-A-Cath without erythema.  Lab Results:  Lab Results  Component Value Date   WBC 9.5 05/26/2018   HGB 8.6 (L) 05/26/2018   HCT 28.5 (L) 05/26/2018   MCV 82.1 05/26/2018   PLT 183 05/26/2018   NEUTROABS 7.4 05/26/2018    Imaging:  No results found.  Medications: I have reviewed the patient's current medications.  Assessment/Plan: 1. 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 Adenocarcinoma consistent with pancreatobiliary primary.  Cycle 1 FOLFIRINOX 06/18/2017  Cycle  2FOLFIRINOX 07/02/2017  Cycle 3 FOLFIRINOX 07/16/2017 (oxaliplatin dose reduced due to mild thrombocytopenia)  CT 07/24/2017-new splenomegaly and left hydronephrosis  Cycle 4 FOLFIRINOX 08/08/2017  Cycle 5 FOLFIRINOX 08/24/2017  CT abdomen/pelvis 09/03/2017-stable liver lesions and pancreas mass, stable right biliary dilatation, possible new 8 mm right liver lesion versus a vascular phenomena, lesion at the right paracolic gutter was present in the past-significance unclear  Cycle 6 FOLFIRINOX1/29/2019  Cycle7 FOLFIRINOX2/19/2019 (Irinotecan andOxaliplatin dose reduced due to thrombocytopenia)  CT abdomen/pelvis 10/25/2017- findings concerning for enterocolitis. No bowel obstruction. Ill-defined pancreatic head mass. Multiple hepatic hypoenhancing lesions with the largest measuring 1.9 x 2.0 cm, previously 1.7 x 1.7 cm; 9 x 10 mm hypoenhancing lesion in the inferior right lobe of the liver slightly larger compared to the previous CT; 1.6 x 2.4 cm enhancing lesion in the right paracolic gutter increased in size compared to the prior CT.  Cycle 1 gemcitabine/Abraxane 11/04/2017  Cycle 2 gemcitabine/Abraxane 11/25/2017  Cycle 3 gemcitabine/Abraxane 12/08/2017  Cycle 4 gemcitabine 12/23/2017 (Abraxane held secondary to neuropathy)  CTs abdomen/pelvis 01/12/2018- decrease in size of the pancreatic mass and hepatic metastasis; slight interval decrease in size of right paracolic gutter implant; worsening abdominal/pelvic ascites. Bowel wall thickening likely due to ascites and low albumin. 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  Cycle 5 gemcitabine 01/13/2018 (Abraxane held secondary to neuropathy)  Cycle 6 gemcitabine/Abraxane 01/27/2018  CT abdomen/pelvis 02/01/2018- dominant metastases in the liver are unchanged, multiple new low-density lesions throughout the right lobe-small abscesses?, No change in pancreas mass  Cycle 7 gemcitabine/Abraxane  02/16/2018(gemcitabine and Abraxane dose reduced due to previous neutropenia)  Cycle 8 gemcitabine/Abraxane 03/03/2018  Cycle 9 gemcitabine/Abraxane 03/25/2018  Cycle 10 gemcitabine/Abraxane 04/14/2018  Restaging CTs 05/04/2018- similar ill-defined pancreatic mass; multiple liver metastases again noted, appears slightly improved; increase in volume of abdominal and pelvic ascites; diffuse small bowel wall edema and mild edema of the ascending colon and transverse colon.  Cycle 11 gemcitabine/Abraxane 05/05/2018  Cycle 12 gemcitabine/Abraxane 06/02/2018    2. Pain secondary to #1.Improved.  3. Weight losssecondary to #1.  4. Microcytic anemia  Transfused with packed red blood cells 07/25/2017, 16 2019, and 11/27/2017  5. History of thrombocytopenia secondary to chemotherapy  6. Port-A-Cath placement 06/04/2017  7. Elevated liver enzymes/bilirubin10/22/2018-likely early biliary obstruction secondary to pancreas cancer, status post ERCP-placement of a common bile duct stent 06/09/2018  8.Postprandial abdominal pain. Protonix initiated 07/16/2017.Trial of Reglan initiated 08/24/2017.  9.Admission with fever 07/24/2017-no source for infection identified, resolved   10.Diarrheafollowing cycle 3 FOLFIRINOX-resolved  11.Left hydronephrosis noted on CT12/03/2017-etiology unclear, urology consulted-stent not recommended.Mild left hydronephrosis noted on CT 10/25/2017. Creatinine normal 10/27/2017.  12. Admission 08/31/2017 with a high fever, blood culture positive for gram-negative rods-Klebsiella pneumoniae identified  13.Fever 10/25/2017 status post evaluation in the emergency department. CT with findings concerning for enterocolitis.   14. Admission 11/11/2017 with a high fever-blood cultures (peripheral and Port-A-Cath) positive for gram-negative rods--STENOTROPHOMONAS MALTOPHILIA;course of Septra completed as an outpatient.  15. Rash-potentially  secondary to gemcitabine or infection, improved  16. Pancytopenia secondary to chemotherapy  17.Intermittent fever. Question "tumor fever", question infection. Now on Bactrim prophylaxis, improved  18. Ascites.Paracentesis 01/14/2018- reactive mesothelial cells, mixed inflammation;culture positive for Klebsiella and enterococcustreated withAugmentin  Admission with fever 01/30/2018, blood cultures negative, discharged on ciprofloxacin/Flagyl  Recurrent ascites, paracentesis 02/23/2018-culture positive for strep viridans, placed on Levaquin  Paracentesis 03/03/2018- 2 L of fluid removed, culture and cytology negative  Trial of Aldactone/Lasix started 05/26/2018  19.Status post evaluation back pain in the emergency department 04/23/2018-MRI lumbar spine with L5-S1 left subarticular disc extrusion  L5-S1 laminotomy and microdiscectomy 05/17/2018, pain resolved   Disposition: Crystal Carlson appears stable.  Plan to proceed with gemcitabine/Abraxane today as scheduled.  We reviewed the CBC from today.  Counts are adequate for treatment.  The abdominal distention is better since beginning the trial of Aldactone/Lasix.  She will continue the same.  She will return for lab, follow-up in the next cycle of gemcitabine/Abraxane in 3 weeks.  Patient seen with Dr. Benay Spice.    [name] ANP/GNP-BC   06/02/2018  10:12 AM   This was a shared visit with [name].  Crystal Carlson is interviewed and examined.  The ascites appears to be reaccumulating slower while on Aldactone/furosemide.  She will continue the current diuretic regimen.  She will complete another treatment with gemcitabine/Abraxane today.  [name], MD

## 2018-06-03 LAB — CANCER ANTIGEN 19-9: CA 19-9: 2791 U/mL — ABNORMAL HIGH (ref 0–35)

## 2018-06-07 MED FILL — levoFLOXacin 500 MG TABS: 500 | 30 days supply | Qty: 30 | Fill #0

## 2018-06-07 MED FILL — predniSONE 5 MG TABS: 5 | 15 days supply | Qty: 30 | Fill #1

## 2018-06-16 ENCOUNTER — Ambulatory Visit: Payer: Medicaid Other | Admitting: Oncology

## 2018-06-16 ENCOUNTER — Other Ambulatory Visit: Payer: Self-pay

## 2018-06-16 ENCOUNTER — Emergency Department (HOSPITAL_COMMUNITY): Payer: Medicaid Other

## 2018-06-16 ENCOUNTER — Encounter (HOSPITAL_COMMUNITY): Payer: Self-pay | Admitting: Emergency Medicine

## 2018-06-16 ENCOUNTER — Ambulatory Visit: Payer: Medicaid Other

## 2018-06-16 ENCOUNTER — Inpatient Hospital Stay (HOSPITAL_COMMUNITY)
Admission: EM | Admit: 2018-06-16 | Discharge: 2018-06-24 | DRG: 871 | Disposition: A | Discharge status: Home Health | Payer: Medicaid Other | Attending: Family Medicine | Admitting: Family Medicine

## 2018-06-16 ENCOUNTER — Other Ambulatory Visit: Payer: Medicaid Other

## 2018-06-16 ENCOUNTER — Telehealth: Payer: Self-pay | Admitting: *Deleted

## 2018-06-16 DIAGNOSIS — Z7952 Long term (current) use of systemic steroids: Secondary | ICD-10-CM

## 2018-06-16 DIAGNOSIS — R21 Rash and other nonspecific skin eruption: Secondary | ICD-10-CM | POA: Diagnosis not present

## 2018-06-16 DIAGNOSIS — K2980 Duodenitis without bleeding: Secondary | ICD-10-CM | POA: Diagnosis present

## 2018-06-16 DIAGNOSIS — K750 Abscess of liver: Secondary | ICD-10-CM | POA: Diagnosis present

## 2018-06-16 DIAGNOSIS — R180 Malignant ascites: Secondary | ICD-10-CM | POA: Diagnosis present

## 2018-06-16 DIAGNOSIS — R627 Adult failure to thrive: Secondary | ICD-10-CM | POA: Diagnosis present

## 2018-06-16 DIAGNOSIS — I10 Essential (primary) hypertension: Secondary | ICD-10-CM | POA: Diagnosis present

## 2018-06-16 DIAGNOSIS — R7303 Prediabetes: Secondary | ICD-10-CM | POA: Diagnosis present

## 2018-06-16 DIAGNOSIS — E860 Dehydration: Secondary | ICD-10-CM | POA: Diagnosis present

## 2018-06-16 DIAGNOSIS — Z79899 Other long term (current) drug therapy: Secondary | ICD-10-CM | POA: Diagnosis not present

## 2018-06-16 DIAGNOSIS — D61811 Other drug-induced pancytopenia: Secondary | ICD-10-CM | POA: Diagnosis not present

## 2018-06-16 DIAGNOSIS — R6520 Severe sepsis without septic shock: Secondary | ICD-10-CM | POA: Diagnosis present

## 2018-06-16 DIAGNOSIS — N179 Acute kidney failure, unspecified: Secondary | ICD-10-CM | POA: Diagnosis present

## 2018-06-16 DIAGNOSIS — R188 Other ascites: Secondary | ICD-10-CM | POA: Diagnosis present

## 2018-06-16 DIAGNOSIS — E43 Unspecified severe protein-calorie malnutrition: Secondary | ICD-10-CM

## 2018-06-16 DIAGNOSIS — C259 Malignant neoplasm of pancreas, unspecified: Secondary | ICD-10-CM | POA: Diagnosis not present

## 2018-06-16 DIAGNOSIS — K766 Portal hypertension: Secondary | ICD-10-CM | POA: Diagnosis present

## 2018-06-16 DIAGNOSIS — T451X5A Adverse effect of antineoplastic and immunosuppressive drugs, initial encounter: Secondary | ICD-10-CM | POA: Diagnosis present

## 2018-06-16 DIAGNOSIS — F419 Anxiety disorder, unspecified: Secondary | ICD-10-CM | POA: Diagnosis present

## 2018-06-16 DIAGNOSIS — N1330 Unspecified hydronephrosis: Secondary | ICD-10-CM | POA: Diagnosis present

## 2018-06-16 DIAGNOSIS — R6511 Systemic inflammatory response syndrome (SIRS) of non-infectious origin with acute organ dysfunction: Secondary | ICD-10-CM | POA: Diagnosis not present

## 2018-06-16 DIAGNOSIS — Z91018 Allergy to other foods: Secondary | ICD-10-CM

## 2018-06-16 DIAGNOSIS — D509 Iron deficiency anemia, unspecified: Secondary | ICD-10-CM | POA: Diagnosis present

## 2018-06-16 DIAGNOSIS — D649 Anemia, unspecified: Secondary | ICD-10-CM | POA: Diagnosis not present

## 2018-06-16 DIAGNOSIS — G893 Neoplasm related pain (acute) (chronic): Secondary | ICD-10-CM | POA: Diagnosis not present

## 2018-06-16 DIAGNOSIS — C250 Malignant neoplasm of head of pancreas: Secondary | ICD-10-CM

## 2018-06-16 DIAGNOSIS — R109 Unspecified abdominal pain: Secondary | ICD-10-CM

## 2018-06-16 DIAGNOSIS — E440 Moderate protein-calorie malnutrition: Secondary | ICD-10-CM | POA: Diagnosis present

## 2018-06-16 DIAGNOSIS — R161 Splenomegaly, not elsewhere classified: Secondary | ICD-10-CM | POA: Diagnosis not present

## 2018-06-16 DIAGNOSIS — Z6821 Body mass index (BMI) 21.0-21.9, adult: Secondary | ICD-10-CM | POA: Diagnosis not present

## 2018-06-16 DIAGNOSIS — Z8249 Family history of ischemic heart disease and other diseases of the circulatory system: Secondary | ICD-10-CM | POA: Diagnosis not present

## 2018-06-16 DIAGNOSIS — C787 Secondary malignant neoplasm of liver and intrahepatic bile duct: Secondary | ICD-10-CM | POA: Diagnosis present

## 2018-06-16 DIAGNOSIS — R52 Pain, unspecified: Secondary | ICD-10-CM

## 2018-06-16 DIAGNOSIS — A419 Sepsis, unspecified organism: Secondary | ICD-10-CM | POA: Diagnosis present

## 2018-06-16 DIAGNOSIS — Z833 Family history of diabetes mellitus: Secondary | ICD-10-CM

## 2018-06-16 DIAGNOSIS — R160 Hepatomegaly, not elsewhere classified: Secondary | ICD-10-CM | POA: Diagnosis not present

## 2018-06-16 DIAGNOSIS — D61810 Antineoplastic chemotherapy induced pancytopenia: Secondary | ICD-10-CM | POA: Diagnosis present

## 2018-06-16 LAB — CBC
HEMATOCRIT: 33.2 % — AB (ref 36.0–46.0)
HEMOGLOBIN: 10 g/dL — AB (ref 12.0–15.0)
MCH: 23.5 pg — AB (ref 26.0–34.0)
MCHC: 30.1 g/dL (ref 30.0–36.0)
MCV: 78.1 fL — ABNORMAL LOW (ref 80.0–100.0)
Platelets: 250 10*3/uL (ref 150–400)
RBC: 4.25 MIL/uL (ref 3.87–5.11)
RDW: 21.2 % — ABNORMAL HIGH (ref 11.5–15.5)
WBC: 21.3 10*3/uL — ABNORMAL HIGH (ref 4.0–10.5)
nRBC: 0 % (ref 0.0–0.2)

## 2018-06-16 LAB — URINALYSIS, ROUTINE W REFLEX MICROSCOPIC
Bilirubin Urine: NEGATIVE
Glucose, UA: NEGATIVE mg/dL
Hgb urine dipstick: NEGATIVE
Ketones, ur: NEGATIVE mg/dL
LEUKOCYTES UA: NEGATIVE
NITRITE: NEGATIVE
PROTEIN: NEGATIVE mg/dL
Specific Gravity, Urine: 1.008 (ref 1.005–1.030)
pH: 6 (ref 5.0–8.0)

## 2018-06-16 LAB — COMPREHENSIVE METABOLIC PANEL
ALBUMIN: 2.3 g/dL — AB (ref 3.5–5.0)
ALT: 29 U/L (ref 0–44)
AST: 42 U/L — AB (ref 15–41)
Alkaline Phosphatase: 311 U/L — ABNORMAL HIGH (ref 38–126)
Anion gap: 10 (ref 5–15)
BUN: 31 mg/dL — ABNORMAL HIGH (ref 6–20)
CHLORIDE: 99 mmol/L (ref 98–111)
CO2: 22 mmol/L (ref 22–32)
Calcium: 8.7 mg/dL — ABNORMAL LOW (ref 8.9–10.3)
Creatinine, Ser: 1.43 mg/dL — ABNORMAL HIGH (ref 0.44–1.00)
GFR calc Af Amer: 49 mL/min — ABNORMAL LOW (ref >60)
GFR calc non Af Amer: 42 mL/min — ABNORMAL LOW (ref >60)
GLUCOSE: 129 mg/dL — AB (ref 70–99)
POTASSIUM: 4.2 mmol/L (ref 3.5–5.1)
SODIUM: 131 mmol/L — AB (ref 135–145)
Total Bilirubin: 1.5 mg/dL — ABNORMAL HIGH (ref 0.3–1.2)
Total Protein: 7.6 g/dL (ref 6.5–8.1)

## 2018-06-16 LAB — PROTIME-INR
INR: 1.34
PROTHROMBIN TIME: 16.4 s — AB (ref 11.4–15.2)

## 2018-06-16 LAB — CBG MONITORING, ED: GLUCOSE-CAPILLARY: 119 mg/dL — AB (ref 70–99)

## 2018-06-16 LAB — I-STAT CG4 LACTIC ACID, ED
LACTIC ACID, VENOUS: 1.33 mmol/L (ref 0.5–1.9)
Lactic Acid, Venous: 0.86 mmol/L (ref 0.5–1.9)

## 2018-06-16 LAB — LIPASE, BLOOD: Lipase: 23 U/L (ref 11–51)

## 2018-06-16 MED ORDER — HYDROMORPHONE HCL 2 MG PO TABS
4.0000 mg | ORAL_TABLET | ORAL | Status: DC | PRN
  Administered 2018-06-18 – 2018-06-22 (×5): 4 mg via ORAL
  Administered 2018-06-23 – 2018-06-24 (×2): 8 mg via ORAL
  Filled 2018-06-16: qty 4
  Filled 2018-06-16 (×4): qty 2
  Filled 2018-06-16: qty 4
  Filled 2018-06-16 (×2): qty 2

## 2018-06-16 MED ORDER — IOHEXOL 300 MG/ML  SOLN
30.0000 mL | Freq: Once | INTRAMUSCULAR | Status: AC | PRN
  Administered 2018-06-16: 30 mL via ORAL

## 2018-06-16 MED ORDER — MORPHINE SULFATE ER 15 MG PO TBCR
15.0000 mg | EXTENDED_RELEASE_TABLET | Freq: Two times a day (BID) | ORAL | Status: DC
  Administered 2018-06-17 – 2018-06-24 (×13): 15 mg via ORAL
  Filled 2018-06-16 (×13): qty 1

## 2018-06-16 MED ORDER — ACETAMINOPHEN 650 MG RE SUPP: 650.0000 mg | Freq: Four times a day (QID) | RECTAL | Status: DC | PRN

## 2018-06-16 MED ORDER — SODIUM CHLORIDE 0.9 % IV BOLUS
1000.0000 mL | Freq: Once | INTRAVENOUS | Status: AC
  Administered 2018-06-16: 1000 mL via INTRAVENOUS

## 2018-06-16 MED ORDER — LIDOCAINE-PRILOCAINE 2.5-2.5 % EX CREA
1.0000 "application " | TOPICAL_CREAM | Freq: Every day | CUTANEOUS | Status: DC | PRN
  Filled 2018-06-16: qty 5

## 2018-06-16 MED ORDER — FERROUS SULFATE 325 (65 FE) MG PO TABS
325.0000 mg | ORAL_TABLET | Freq: Every day | ORAL | Status: DC
  Administered 2018-06-17 – 2018-06-23 (×7): 325 mg via ORAL
  Filled 2018-06-16 (×8): qty 1

## 2018-06-16 MED ORDER — POLYETHYLENE GLYCOL 3350 17 G PO PACK
17.0000 g | PACK | Freq: Every day | ORAL | Status: DC
  Administered 2018-06-18 – 2018-06-24 (×5): 17 g via ORAL
  Filled 2018-06-16 (×6): qty 1

## 2018-06-16 MED ORDER — SODIUM CHLORIDE 0.9 % IJ SOLN
INTRAMUSCULAR | Status: AC
  Filled 2018-06-16: qty 50

## 2018-06-16 MED ORDER — SODIUM CHLORIDE 0.9 % IV SOLN
INTRAVENOUS | Status: DC
  Administered 2018-06-16 – 2018-06-20 (×5): via INTRAVENOUS

## 2018-06-16 MED ORDER — SODIUM CHLORIDE 0.9 % IV SOLN
2.0000 g | Freq: Once | INTRAVENOUS | Status: AC
  Administered 2018-06-16: 2 g via INTRAVENOUS
  Filled 2018-06-16: qty 2

## 2018-06-16 MED ORDER — PREDNISONE 10 MG PO TABS
10.0000 mg | ORAL_TABLET | Freq: Two times a day (BID) | ORAL | Status: DC
  Administered 2018-06-17 – 2018-06-24 (×15): 10 mg via ORAL
  Filled 2018-06-16 (×15): qty 1

## 2018-06-16 MED ORDER — VANCOMYCIN HCL IN DEXTROSE 1-5 GM/200ML-% IV SOLN
1000.0000 mg | Freq: Once | INTRAVENOUS | Status: AC
  Administered 2018-06-16: 1000 mg via INTRAVENOUS
  Filled 2018-06-16: qty 200

## 2018-06-16 MED ORDER — MAGNESIUM CITRATE PO SOLN
0.7500 | Freq: Once | ORAL | Status: AC
  Administered 2018-06-16: 0.75 via ORAL
  Filled 2018-06-16: qty 296

## 2018-06-16 MED ORDER — LORAZEPAM 0.5 MG PO TABS
0.5000 mg | ORAL_TABLET | Freq: Three times a day (TID) | ORAL | Status: DC | PRN
  Administered 2018-06-17 – 2018-06-19 (×6): 0.5 mg via ORAL
  Filled 2018-06-16 (×6): qty 1

## 2018-06-16 MED ORDER — FONDAPARINUX SODIUM 2.5 MG/0.5ML ~~LOC~~ SOLN
2.5000 mg | Freq: Every day | SUBCUTANEOUS | Status: DC
  Administered 2018-06-18: 2.5 mg via SUBCUTANEOUS
  Filled 2018-06-16 (×2): qty 0.5

## 2018-06-16 MED ORDER — SENNA 8.6 MG PO TABS: 1.0000 | ORAL_TABLET | Freq: Every day | ORAL | Status: DC | PRN

## 2018-06-16 MED ORDER — IOHEXOL 300 MG/ML  SOLN
75.0000 mL | Freq: Once | INTRAMUSCULAR | Status: AC | PRN
  Administered 2018-06-16: 75 mL via INTRAVENOUS

## 2018-06-16 MED ORDER — ONDANSETRON HCL 4 MG/2ML IJ SOLN: 4.0000 mg | Freq: Four times a day (QID) | INTRAMUSCULAR | Status: DC | PRN

## 2018-06-16 MED ORDER — ACETAMINOPHEN 325 MG PO TABS: 650.0000 mg | ORAL_TABLET | Freq: Four times a day (QID) | ORAL | Status: DC | PRN

## 2018-06-16 MED ORDER — METRONIDAZOLE IN NACL 5-0.79 MG/ML-% IV SOLN
500.0000 mg | Freq: Three times a day (TID) | INTRAVENOUS | Status: DC
  Administered 2018-06-16 – 2018-06-20 (×10): 500 mg via INTRAVENOUS
  Filled 2018-06-16 (×9): qty 100

## 2018-06-16 MED ORDER — ONDANSETRON HCL 4 MG PO TABS: 4.0000 mg | ORAL_TABLET | Freq: Four times a day (QID) | ORAL | Status: DC | PRN

## 2018-06-16 MED ORDER — ENOXAPARIN SODIUM 40 MG/0.4ML ~~LOC~~ SOLN: 40.0000 mg | SUBCUTANEOUS | Status: DC

## 2018-06-16 NOTE — Progress Notes (Signed)
Pharmacy Antibiotic Note  Samaya Seres is a 52 y.o. female admitted on 06/16/2018 with sepsis.  Pharmacy has been consulted for cefepime and vancomycin dosing.  Plan: Cefepime 2 Gm x1 then 1 Gm IV q12h Vancomycin 1 Gm q36h for est AUC = 520 Goal AUC = 400-500 F/u scr/cultures/levels     Temp (24hrs), Avg:100 F (37.8 C), Min:100 F (37.8 C), Max:100 F (37.8 C)  Recent Labs  Lab 06/16/18 1706 06/16/18 1724 06/16/18 1835 06/16/18 2018  WBC 21.3*  --   --   --   CREATININE  --  1.43*  --   --   LATICACIDVEN  --   --  1.33 0.86    CrCl cannot be calculated (Unknown ideal weight.).    Allergies  Allergen Reactions  . Pork-Derived Products     RELIGIOUS BELIEF -  NO PORK    Antimicrobials this admission: 10/30 cefepime >>  10/30 vancomycin >>  10/30 flagyl >>  Dose adjustments this admission:   Microbiology results:  BCx:   UCx:    Sputum:    MRSA PCR:  Thank you for allowing pharmacy to be a part of this patient's care.  Dorrene German 06/16/2018 10:50 PM

## 2018-06-16 NOTE — ED Notes (Signed)
Patient transported to CT 

## 2018-06-16 NOTE — Telephone Encounter (Signed)
Patients husband called to report patient has not been able to walk since yesterday. She has been incontinent. Unclear extent of incontinence. Patient is not eating since Sunday and refuses to drink since yesterday. Husband advised to bring wife to ED. He agrees with plan. MD notified.

## 2018-06-16 NOTE — ED Provider Notes (Signed)
[facility] DEPT Provider Note   CSN: [id number] Arrival date & time: 06/16/18  1632     History   Chief Complaint Chief Complaint  Patient presents with  . Fatigue    HPI Crystal Carlson is a 52 y.o. female with history of pancreatic cancer, currently receiving chemotherapy, last treatment 7 days ago presenting for weakness and fatigue that began yesterday as well as fever of 37 F noted 2 days ago by family member. Patient states that the weakness and fatigue began yesterday and has progressed today and is unable to get out of bed without assistance from family member.  Upon standing patient states that she becomes lightheaded and must sit back down.   Denies pain, history of trauma, cough, dysuria, abdominal pain, nausea/vomiting or diarrhea.   HPI  Past Medical History:  Diagnosis Date  . Anxiety   . Cancer of pancreas ([place]) 05/26/2017   chemo  . Chronic pancreatitis ([place]) 05/19/2017  . Depression   . Edema    LE  . Liver metastases ([place]) 05/26/2017  . Microcytic anemia 05/19/2017  . Prediabetes 2016   when living in [place]    Patient Active Problem List   Diagnosis Date Noted  . Anxiety 06/16/2018  . AKI (acute kidney injury) ([place]) 06/16/2018  . Hydronephrosis, left 06/16/2018  . Iron deficiency anemia 06/16/2018  . Duodenitis   . Lumbar radiculopathy 05/17/2018  . Peritonitis ([place])   . Ascites 01/30/2018  . Sepsis ([place]) 11/11/2017  . Thrombocytopenia ([place]) 11/11/2017  . SIRS (systemic inflammatory response syndrome) ([place]) 09/01/2017  . Fever of unknown origin 08/31/2017  . Antineoplastic chemotherapy induced pancytopenia ([place]) 07/25/2017  . Protein-calorie malnutrition, moderate ([place]) 07/25/2017  . Dehydration 07/24/2017  . Hypokalemia 07/24/2017  . Neutropenic fever ([place]) 07/24/2017  . Port-A-Cath in place 07/02/2017  . Cancer related pain 07/02/2017  . Encounter for antineoplastic chemotherapy 07/02/2017  . Goals of care,  counseling/discussion 05/29/2017  . Pancreatic cancer metastasized to liver ([place]) 05/26/2017  . Abdominal pain 05/25/2017  . HTN (hypertension) 05/25/2017  . Ovarian cyst 05/25/2017  . Elevated LFTs 05/25/2017  . Hepatic metastasis ([place])   . Chronic pancreatitis ([place]) 05/19/2017  . Microcytic anemia 05/19/2017  . Prediabetes 08/18/2014    Past Surgical History:  Procedure Laterality Date  . ERCP N/A 06/09/2017   Procedure: ENDOSCOPIC RETROGRADE CHOLANGIOPANCREATOGRAPHY (ERCP);  Surgeon: Clarene Essex, MD;  Location: [facility];  Service: Endoscopy;  Laterality: N/A;  . IR FLUORO GUIDE PORT INSERTION RIGHT  06/04/2017  . IR PARACENTESIS  04/13/2018  . IR PARACENTESIS  04/30/2018  . IR PARACENTESIS  05/12/2018  . IR US GUIDE VASC ACCESS RIGHT  06/04/2017  . LUMBAR LAMINECTOMY/DECOMPRESSION MICRODISCECTOMY Left 05/17/2018   Procedure: Left Lumbar Five-Sacral One Microdiscectomy;  Surgeon: Earnie Larsson, MD;  Location: [facility];  Service: Neurosurgery;  Laterality: Left;     OB History   None      Home Medications    Prior to Admission medications   Medication Sig Start Date End Date Taking? Authorizing Provider  ferrous sulfate 325 (65 FE) MG EC tablet Take 1 tablet (325 mg total) by mouth 3 (three) times daily with meals. Patient taking differently: Take 325 mg by mouth daily at 2 PM.  06/18/17  Yes Ladell Pier, MD  furosemide (LASIX) 40 MG tablet Take 1 tablet (40 mg total) by mouth daily. 05/26/18  Yes Ladell Pier, MD  HYDROmorphone (DILAUDID) 4 MG tablet Take 1-2 tablets (4-8 mg total) by  mouth every 4 (four) hours as needed for severe pain. Patient taking differently: Take 4-8 mg by mouth every 4 (four) hours as needed for severe pain (for pain).  05/05/18  Yes Owens Shark, NP  levofloxacin (LEVAQUIN) 500 MG tablet Take 1 tablet (500 mg total) by mouth daily. 05/26/18  Yes Ladell Pier, MD  lidocaine-prilocaine (EMLA) cream Apply to port site one hour prior to use. Do  not rub in. Cover with plastic. Patient taking differently: Apply 1 application topically daily as needed (prior to chemotherapy). Apply to port site one hour prior to use. Do not rub in. Cover with plastic. 11/17/17  Yes Owens Shark, NP  LORazepam (ATIVAN) 0.5 MG tablet Take 1 tablet (0.5 mg total) by mouth every 8 (eight) hours as needed for anxiety (or nausea). 05/05/18  Yes Owens Shark, NP  magnesium citrate SOLN Take 0.75 Bottles by mouth once.   Yes [provider]  morphine (MS CONTIN) 15 MG 12 hr tablet Take 1 tablet (15 mg total) by mouth every 12 (twelve) hours. 04/14/18  Yes Ladell Pier, MD  naproxen sodium (ALEVE) 220 MG tablet Take 440 mg by mouth 2 (two) times daily as needed (fever).   Yes [provider]  predniSONE (DELTASONE) 5 MG tablet Take 1 tablet (5 mg total) by mouth 2 (two) times daily. Patient taking differently: Take 5 mg by mouth daily.  05/26/18  Yes Ladell Pier, MD  spironolactone (ALDACTONE) 100 MG tablet Take 1 tablet (100 mg total) by mouth daily. 05/26/18  Yes Ladell Pier, MD  hydrocortisone cream 1 % Apply 1 application topically 3 (three) times daily as needed for itching (minor skin irritation). Patient not taking: Reported on 05/07/2018 07/27/17   Theodis Blaze, MD  methylPREDNISolone (MEDROL DOSEPAK) 4 MG TBPK tablet Day 1: 8 mg PO before breakfast, 4 mg after lunch and after dinner, and 8 mg at bedtime  Day 2: 4 mg PO before breakfast, after lunch, and after dinner and 8 mg at bedtime  Day 3: 4 mg PO before breakfast, after lunch, after dinner, and at bedtime  Day 4: 4 mg PO before breakfast, after lunch, and at bedtime  Day 5: 4 mg PO before breakfast and at bedtime  Day 6: 4 mg PO before breakfast Patient not taking: Reported on 05/07/2018 04/23/18   Sherwood Gambler, MD    Family History Family History  Problem Relation Age of Onset  . Hypertension Mother   . Arthritis Mother   . Diabetes Father   . Heart disease  Father        ?valvular problems  . Hyperlipidemia Son   . Diabetes Brother     Social History Social History   Tobacco Use  . Smoking status: Never Smoker  . Smokeless tobacco: Never Used  Substance Use Topics  . Alcohol use: No  . Drug use: No     Allergies   Pork-derived products   Review of Systems Review of Systems  Constitutional: Positive for fatigue and fever. Negative for chills.  HENT: Negative.  Negative for rhinorrhea and sore throat.   Eyes: Negative.  Negative for visual disturbance.  Respiratory: Negative.  Negative for cough and shortness of breath.   Cardiovascular: Negative.  Negative for chest pain and leg swelling.  Gastrointestinal: Positive for constipation. Negative for abdominal pain, blood in stool, diarrhea, nausea and vomiting.  Genitourinary: Negative.  Negative for dysuria and hematuria.  Musculoskeletal: Negative.  Negative for  arthralgias and myalgias.  Skin: Negative.  Negative for rash.  Neurological: Positive for weakness and light-headedness. Negative for syncope and headaches.   Physical Exam Updated Vital Signs BP 92/75 (BP Location: Right Arm)   Pulse 78   Temp 100 F (37.8 C) (Oral)   Resp 17   SpO2 100%   Physical Exam  Constitutional: She is oriented to person, place, and time. She appears well-developed and well-nourished. No distress.  HENT:  Head: Normocephalic and atraumatic.  Right Ear: Hearing, tympanic membrane, external ear and ear canal normal.  Left Ear: Hearing, tympanic membrane, external ear and ear canal normal.  Nose: Nose normal.  Mouth/Throat: Oropharynx is clear and moist.  Eyes: Pupils are equal, round, and reactive to light. EOM are normal.  Neck: Trachea normal and normal range of motion. No tracheal deviation present.  Cardiovascular: Normal rate, normal heart sounds and intact distal pulses.  Pulses:      Radial pulses are 2+ on the right side, and 2+ on the left side.       Dorsalis pedis pulses  are 2+ on the right side, and 2+ on the left side.       Posterior tibial pulses are 2+ on the right side, and 2+ on the left side.  Pulmonary/Chest: Effort normal and breath sounds normal. No respiratory distress. She has no decreased breath sounds.  Abdominal: Soft. Bowel sounds are normal. There is no tenderness. There is no rigidity, no rebound, no guarding, no tenderness at McBurney's point and negative Murphy's sign.  Musculoskeletal: Normal range of motion.       Right lower leg: Normal.       Left lower leg: Normal.  Feet:  Right Foot:  Protective Sensation: 3 sites tested. 3 sites sensed.  Left Foot:  Protective Sensation: 3 sites tested. 3 sites sensed.  Neurological: She is alert and oriented to person, place, and time.  Speech is clear and goal oriented, follows commands Major Cranial nerves without deficit, no facial droop Normal strength in upper and lower extremities bilaterally including dorsiflexion and plantar flexion, strong and equal grip strength Sensation normal to light touch Moves extremities without ataxia, coordination intact Slow gait  Skin: Skin is warm and dry. Capillary refill takes less than 2 seconds.  Psychiatric: She has a normal mood and affect. Her behavior is normal.   ED Treatments / Results  Labs (all labs ordered are listed, but only abnormal results are displayed) Labs Reviewed  CBC - Abnormal; Notable for the following components:      Result Value   WBC 21.3 (*)    Hemoglobin 10.0 (*)    HCT 33.2 (*)    MCV 78.1 (*)    MCH 23.5 (*)    RDW 21.2 (*)    All other components within normal limits  COMPREHENSIVE METABOLIC PANEL - Abnormal; Notable for the following components:   Sodium 131 (*)    Glucose, Bld 129 (*)    BUN 31 (*)    Creatinine, Ser 1.43 (*)    Calcium 8.7 (*)    Albumin 2.3 (*)    AST 42 (*)    Alkaline Phosphatase 311 (*)    Total Bilirubin 1.5 (*)    GFR calc non Af Amer 42 (*)    GFR calc Af Amer 49 (*)    All  other components within normal limits  PROTIME-INR - Abnormal; Notable for the following components:   Prothrombin Time 16.4 (*)    All  other components within normal limits  CBG MONITORING, ED - Abnormal; Notable for the following components:   Glucose-Capillary 119 (*)    All other components within normal limits  CULTURE, BLOOD (ROUTINE X 2)  CULTURE, BLOOD (ROUTINE X 2)  URINE CULTURE  URINALYSIS, ROUTINE W REFLEX MICROSCOPIC  LIPASE, BLOOD  CORTISOL-AM, BLOOD  HCG, QUANTITATIVE, PREGNANCY  LACTIC ACID, PLASMA  PROCALCITONIN  HIV ANTIBODY (ROUTINE TESTING W REFLEX)  BASIC METABOLIC PANEL  CBC  I-STAT CG4 LACTIC ACID, ED  I-STAT CG4 LACTIC ACID, ED    EKG EKG Interpretation  Date/Time:  Wednesday June 16 2018 21:57:20 EDT Ventricular Rate:  82 PR Interval:    QRS Duration: 91 QT Interval:  377 QTC Calculation: 441 R Axis:   91 Text Interpretation:  Sinus rhythm Borderline right axis deviation Nonspecific T abnormalities, lateral leads No significant change since last tracing Confirmed by Wandra Arthurs [phone]) on 06/16/2018 10:50:11 PM   Radiology Dg Chest 2 View  Result Date: 06/16/2018 CLINICAL DATA:  Feeling unwell for 3 days.  On chemotherapy. EXAM: CHEST - 2 VIEW COMPARISON:  11/11/2017 FINDINGS: Porta catheter on the right with tip at the upper cavoatrial junction. There is no edema, consolidation, effusion, or pneumothorax. Normal heart size and mediastinal contours. IMPRESSION: No evidence of active disease in the chest. Electronically Signed   By: Monte Fantasia M.D.   On: 06/16/2018 18:13   Ct Abdomen Pelvis W Contrast  Result Date: 06/16/2018 CLINICAL DATA:  History of pancreatic cancer.  Weakness. EXAM: CT ABDOMEN AND PELVIS WITH CONTRAST TECHNIQUE: Multidetector CT imaging of the abdomen and pelvis was performed using the standard protocol following bolus administration of intravenous contrast. CONTRAST:  56mL OMNIPAQUE IOHEXOL 300 MG/ML SOLN orally,  50mL OMNIPAQUE IOHEXOL 300 MG/ML SOLN intravenously. COMPARISON:  CT scan of May 04, 2018. FINDINGS: Lower chest: No acute abnormality. Hepatobiliary: Multiple low densities are noted in the right hepatic lobe which are enlarged compared to prior exam consistent with metastatic disease. The largest measures 3.0 x 2.3 cm in dome of right hepatic lobe. No gallstones are noted. Stable appearance of biliary stent extending from common hepatic duct into duodenum. Pancreas: Grossly stable ill-defined mass is noted measuring 6.8 x 3.5 cm which extends into retroperitoneal soft tissues. There remains atrophy of the pancreatic body and tail with associated pancreatic ductal dilatation. Spleen: Maximum measured diameter of 17.5 cm which is unchanged compared to prior exam. No focal abnormality is noted. Adrenals/Urinary Tract: Adrenal glands appear normal. Mild left hydronephrosis is noted without obstructing calculus. Mild urinary bladder distention is noted. No renal or ureteral calculi are noted. Stomach/Bowel: The stomach is unremarkable. Wall thickening of the duodenum and proximal jejunum is noted concerning for inflammation or edema. Stool is noted throughout the colon. No abnormal bowel dilatation is noted. The appendix is not visualized. Vascular/Lymphatic: Abdominal aorta is unremarkable. 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 Reproductive: Uterus and bilateral adnexa are unremarkable. Other: No abdominal wall hernia or abnormality. No abdominopelvic ascites. Musculoskeletal: No acute or significant osseous findings. IMPRESSION: Grossly stable ill-defined mass is seen arising from the pancreas measuring 6.8 x 3.5 cm, which extends into surrounding retroperitoneal soft tissues. Multiple low densities are noted in the right hepatic lobe which are increased in size compared to prior exam and consistent with worsening metastatic disease.  Biliary stent is again noted. Continued wall thickening of duodenum and proximal jejunum is noted concerning for inflammation or edema.  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 Stable moderate splenomegaly. Mild left hydronephrosis is noted without obstructing calculus; potentially there may be ureteral occlusion secondary to retroperitoneal neoplasm. Electronically Signed   By: Marijo Conception, M.D.   On: 06/16/2018 21:32    Procedures .Critical Care Performed by: Deliah Boston, PA-C Authorized by: Deliah Boston, PA-C   Critical care provider statement:    Critical care time (minutes):  35   Critical care was necessary to treat or prevent imminent or life-threatening deterioration of the following conditions:  Sepsis (Immunocompromised patient requiring multiple antibiotics.)   Critical care was time spent personally by me on the following activities:  Ordering and performing treatments and interventions, ordering and review of laboratory studies, ordering and review of radiographic studies, pulse oximetry, re-evaluation of patient's condition, review of old charts, obtaining history from patient or surrogate, examination of patient and evaluation of patient's response to treatment   (including critical care time)  Medications Ordered in ED Medications  metroNIDAZOLE (FLAGYL) IVPB 500 mg (0 mg Intravenous Stopped 06/16/18 2010)  sodium chloride 0.9 % injection (has no administration in time range)  HYDROmorphone (DILAUDID) tablet 4-8 mg (has no administration in time range)  morphine (MS CONTIN) 12 hr tablet 15 mg (has no administration in time range)  LORazepam (ATIVAN) tablet 0.5 mg (has no administration in time range)  predniSONE (DELTASONE) tablet 10 mg (has no administration in time range)  magnesium citrate solution 0.75 Bottle (has no administration in time range)  ferrous sulfate EC tablet 325 mg (has no administration in time range)  lidocaine-prilocaine  (EMLA) cream 1 application (has no administration in time range)  polyethylene glycol (MIRALAX / GLYCOLAX) packet 17 g (has no administration in time range)  senna (SENOKOT) tablet 8.6 mg (has no administration in time range)  enoxaparin (LOVENOX) injection 40 mg (has no administration in time range)  acetaminophen (TYLENOL) tablet 650 mg (has no administration in time range)    Or  acetaminophen (TYLENOL) suppository 650 mg (has no administration in time range)  ondansetron (ZOFRAN) tablet 4 mg (has no administration in time range)    Or  ondansetron (ZOFRAN) injection 4 mg (has no administration in time range)  sodium chloride 0.9 % bolus 1,000 mL (1,000 mLs Intravenous New Bag/Given 06/16/18 2246)  0.9 %  sodium chloride infusion ( Intravenous New Bag/Given 06/16/18 2249)  sodium chloride 0.9 % bolus 1,000 mL (0 mLs Intravenous Stopped 06/16/18 2010)  ceFEPIme (MAXIPIME) 2 g in sodium chloride 0.9 % 100 mL IVPB (0 g Intravenous Stopped 06/16/18 1903)  vancomycin (VANCOCIN) IVPB 1000 mg/200 mL premix (1,000 mg Intravenous New Bag/Given 06/16/18 2013)  iohexol (OMNIPAQUE) 300 MG/ML solution 75 mL (75 mLs Intravenous Contrast Given 06/16/18 2037)  iohexol (OMNIPAQUE) 300 MG/ML solution 30 mL (30 mLs Oral Contrast Given 06/16/18 2036)     Initial Impression / Assessment and Plan / ED Course  I have reviewed the triage vital signs and the nursing notes.  Pertinent labs & imaging results that were available during my care of the patient were reviewed by me and considered in my medical decision making (see chart for details).  Clinical Course as of Jun 16 2254  Wed Jun 16, 2018  1721 Discussed with Dr. Darl Householder; orders added; will perform US paracentesis prior to antibiotic administration.   [BM]  1810 US performed by Dr. Darl Householder; small amount of fluid present, unable to perform paracentesis. CT abd/pelvis and begin broad spectrum antibiotics.   [BM]  2207 Consult Called to Dr. Blaine Hamper who is seeing  patient in ED.   [BM]    Clinical Course User Index [BM] Deliah Boston, PA-C   52 year old female with history of pancreatic cancer, currently on chemo presenting for generalized weakness and fatigue.  Patient with fever, tachycardia, white blood cell count 21.3.  Patient meets SIRS criteria, blood cultures drawn, fluids given and broad-spectrum antibiotics begun.  Lactic acid within normal limits.  Urinalysis within normal limits Chest x-ray negative CT abdomen reveals inflammation of the duodenum and jejunum.  Patient has been admitted to hospitalist service for sepsis.  Patient was seen and evaluated by Dr. Darl Householder during this visit and agrees with admission.  Note: Portions of this report may have been transcribed using voice recognition software. Every effort was made to ensure accuracy; however, inadvertent computerized transcription errors may still be present.  Final Clinical Impressions(s) / ED Diagnoses   Final diagnoses:  Sepsis without acute organ dysfunction, due to unspecified organism [facility])  Duodenitis    ED Discharge Orders    None       Gari Crown 06/16/18 2258    Drenda Freeze, MD 06/16/18 [phone]

## 2018-06-16 NOTE — ED Triage Notes (Signed)
Pt has pancreatic cancer last chemo was last Wed and gets every 3 weeks. Per family, patient very weak/fatigued and has hard time getting up to use restroom. Denies n/v/d.

## 2018-06-16 NOTE — Progress Notes (Signed)
A consult was received from an ED physician for vancomycin and cefepime per pharmacy dosing (for an indication other than meningitis). The patient's profile has been reviewed for ht/wt/allergies/indication/available labs. A one time order has been placed for the above antibiotics.  Further antibiotics/pharmacy consults should be ordered by admitting physician if indicated.                       Reuel Boom, PharmD, BCPS [phone] 06/16/2018, 6:27 PM

## 2018-06-16 NOTE — ED Notes (Signed)
One set of blood cultures obtained from pts implanted port.

## 2018-06-16 NOTE — H&P (Signed)
History and Physical    Dior Dominik OJJ:[id number] DOB: 11/22/1965 DOA: 06/16/2018  Referring MD/NP/PA:   PCP: Mack Hook, MD   Patient coming from:  The patient is coming from home.  At baseline, pt is independent for most of ADL.   Chief Complaint: Generalized weakness, fever and chills.  HPI: Crystal Carlson is a 52 y.o. female with medical history significant of pancreatitis metastasized to liver on chemotherapy currently, ascites, prediabetes, anxiety, and deficiency anemia, who presents with generalized weakness, fever and chills.  Patient states that she has been feeling fatigued in the past several days, with very poor energy.  Has decreased appetite and oral intake.  She also has fever and chills.  Patient denies any chest pain, shortness breath, cough.  She is constipated, but does not have nausea, vomiting, diarrhea or abdominal pain.  Denies symptoms of UTI or unilateral weakness.  Patient is currently doing chemotherapy, every 3 weeks, last dose was on Wednesday.  ED Course: pt was found to have WBC 29.3, negative urinalysis, AKI with creatinine 1.43, BUN 31, temperature 100, tachycardia, tachypnea, oxygen saturation 96% on room air.  Chest x-ray is negative.  Patient is admitted to the MedSurg bed as inpatient.  CT abdomen/pelvis showed:  1. Grossly stable ill-defined mass is seen arising from the pancreas measuring 6.8 x 3.5 cm, which extends into surrounding retroperitoneal soft tissues. 2. Multiple low densities are noted in the right hepatic lobe which are increased in size compared to prior exam and consistent with worsening metastatic disease. 3. Biliary stent is again noted. Continued wall thickening of duodenum and proximal jejunum is noted concerning for inflammation or edema. 4. 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 5. Stable moderate splenomegaly. 6. Mild left hydronephrosis is noted without obstructing calculus; potentially there may be  ureteral occlusion secondary to retroperitoneal neoplasm.  Review of Systems:   General: has fevers, chills, no body weight gain, has poor appetite, has fatigue HEENT: no blurry vision, hearing changes or sore throat Respiratory: no dyspnea, coughing, wheezing CV: no chest pain, no palpitations GI: no nausea, vomiting, abdominal pain, diarrhea, constipation GU: no dysuria, burning on urination, increased urinary frequency, hematuria  Ext: no leg edema Neuro: no unilateral weakness, numbness, or tingling, no vision change or hearing loss Skin: no rash, no skin tear. MSK: No muscle spasm, no deformity, no limitation of range of movement in spin Heme: No easy bruising.  Travel history: No recent long distant travel.  Allergy:  Allergies  Allergen Reactions  . Pork-Derived Products     RELIGIOUS BELIEF -  NO PORK    Past Medical History:  Diagnosis Date  . Anxiety   . Cancer of pancreas ([place]) 05/26/2017   chemo  . Chronic pancreatitis ([place]) 05/19/2017  . Depression   . Edema    LE  . Liver metastases ([place]) 05/26/2017  . Microcytic anemia 05/19/2017  . Prediabetes 2016   when living in [place]    Past Surgical History:  Procedure Laterality Date  . ERCP N/A 06/09/2017   Procedure: ENDOSCOPIC RETROGRADE CHOLANGIOPANCREATOGRAPHY (ERCP);  Surgeon: Clarene Essex, MD;  Location: [facility];  Service: Endoscopy;  Laterality: N/A;  . IR FLUORO GUIDE PORT INSERTION RIGHT  06/04/2017  . IR PARACENTESIS  04/13/2018  . IR PARACENTESIS  04/30/2018  . IR PARACENTESIS  05/12/2018  . IR US GUIDE VASC ACCESS RIGHT  06/04/2017  . LUMBAR LAMINECTOMY/DECOMPRESSION MICRODISCECTOMY Left 05/17/2018   Procedure: Left Lumbar Five-Sacral One Microdiscectomy;  Surgeon: Earnie Larsson, MD;  Location: [facility];  Service: Neurosurgery;  Laterality: Left;    Social History:  reports that she has never smoked. She has never used smokeless tobacco. She reports that she does not drink alcohol or use  drugs.  Family History:  Family History  Problem Relation Age of Onset  . Hypertension Mother   . Arthritis Mother   . Diabetes Father   . Heart disease Father        ?valvular problems  . Hyperlipidemia Son   . Diabetes Brother      Prior to Admission medications   Medication Sig Start Date End Date Taking? Authorizing Provider  ferrous sulfate 325 (65 FE) MG EC tablet Take 1 tablet (325 mg total) by mouth 3 (three) times daily with meals. Patient taking differently: Take 325 mg by mouth daily at 2 PM.  06/18/17  Yes Ladell Pier, MD  furosemide (LASIX) 40 MG tablet Take 1 tablet (40 mg total) by mouth daily. 05/26/18  Yes Ladell Pier, MD  HYDROmorphone (DILAUDID) 4 MG tablet Take 1-2 tablets (4-8 mg total) by mouth every 4 (four) hours as needed for severe pain. Patient taking differently: Take 4-8 mg by mouth every 4 (four) hours as needed for severe pain (for pain).  05/05/18  Yes Owens Shark, NP  levofloxacin (LEVAQUIN) 500 MG tablet Take 1 tablet (500 mg total) by mouth daily. 05/26/18  Yes Ladell Pier, MD  lidocaine-prilocaine (EMLA) cream Apply to port site one hour prior to use. Do not rub in. Cover with plastic. Patient taking differently: Apply 1 application topically daily as needed (prior to chemotherapy). Apply to port site one hour prior to use. Do not rub in. Cover with plastic. 11/17/17  Yes Owens Shark, NP  LORazepam (ATIVAN) 0.5 MG tablet Take 1 tablet (0.5 mg total) by mouth every 8 (eight) hours as needed for anxiety (or nausea). 05/05/18  Yes Owens Shark, NP  magnesium citrate SOLN Take 0.75 Bottles by mouth once.   Yes [provider]  morphine (MS CONTIN) 15 MG 12 hr tablet Take 1 tablet (15 mg total) by mouth every 12 (twelve) hours. 04/14/18  Yes Ladell Pier, MD  naproxen sodium (ALEVE) 220 MG tablet Take 440 mg by mouth 2 (two) times daily as needed (fever).   Yes [provider]  predniSONE (DELTASONE) 5 MG tablet Take 1  tablet (5 mg total) by mouth 2 (two) times daily. Patient taking differently: Take 5 mg by mouth daily.  05/26/18  Yes Ladell Pier, MD  spironolactone (ALDACTONE) 100 MG tablet Take 1 tablet (100 mg total) by mouth daily. 05/26/18  Yes Ladell Pier, MD  hydrocortisone cream 1 % Apply 1 application topically 3 (three) times daily as needed for itching (minor skin irritation). Patient not taking: Reported on 05/07/2018 07/27/17   Theodis Blaze, MD  methylPREDNISolone (MEDROL DOSEPAK) 4 MG TBPK tablet Day 1: 8 mg PO before breakfast, 4 mg after lunch and after dinner, and 8 mg at bedtime  Day 2: 4 mg PO before breakfast, after lunch, and after dinner and 8 mg at bedtime  Day 3: 4 mg PO before breakfast, after lunch, after dinner, and at bedtime  Day 4: 4 mg PO before breakfast, after lunch, and at bedtime  Day 5: 4 mg PO before breakfast and at bedtime  Day 6: 4 mg PO before breakfast Patient not taking: Reported on 05/07/2018 04/23/18   Sherwood Gambler, MD  Physical Exam: Vitals:   06/16/18 2030 06/16/18 2124 06/16/18 2130 06/16/18 2238  BP: 101/79 94/78 98/70  92/75  Pulse: 87 81 85 78  Resp: (!) 25 19 (!) 23 17  Temp:      TempSrc:      SpO2: 100% 100% 100% 100%   General: Not in acute distress.  Chronic ill appearance, very thin body habitus. HEENT:       Eyes: PERRL, EOMI, no scleral icterus.       ENT: No discharge from the ears and nose, no pharynx injection, no tonsillar enlargement.        Neck: No JVD, no bruit, no mass felt. Heme: No neck lymph node enlargement. Cardiac: S1/S2, RRR, No murmurs, No gallops or rubs. Respiratory:  No rales, wheezing, rhonchi or rubs. GI: Soft, nondistended, nontender, no rebound pain, no organomegaly, BS present. GU: No hematuria Ext: No pitting leg edema bilaterally. 2+DP/PT pulse bilaterally. Musculoskeletal: No joint deformities, No joint redness or warmth, no limitation of ROM in spin. Skin: No rashes.  Neuro: Alert, oriented  X3, cranial nerves II-XII grossly intact, moves all extremities normally.  Psych: Patient is not psychotic, no suicidal or hemocidal ideation.  Labs on Admission: I have personally reviewed following labs and imaging studies  CBC: Recent Labs  Lab 06/16/18 1706  WBC 21.3*  HGB 10.0*  HCT 33.2*  MCV 78.1*  PLT 657   Basic Metabolic Panel: Recent Labs  Lab 06/16/18 1724  NA 131*  K 4.2  CL 99  CO2 22  GLUCOSE 129*  BUN 31*  CREATININE 1.43*  CALCIUM 8.7*   GFR: CrCl cannot be calculated (Unknown ideal weight.). Liver Function Tests: Recent Labs  Lab 06/16/18 1724  AST 42*  ALT 29  ALKPHOS 311*  BILITOT 1.5*  PROT 7.6  ALBUMIN 2.3*   Recent Labs  Lab 06/16/18 1724  LIPASE 23   No results for input(s): AMMONIA in the last 168 hours. Coagulation Profile: Recent Labs  Lab 06/16/18 1724  INR 1.34   Cardiac Enzymes: No results for input(s): CKTOTAL, CKMB, CKMBINDEX, TROPONINI in the last 168 hours. BNP (last 3 results) No results for input(s): PROBNP in the last 8760 hours. HbA1C: No results for input(s): HGBA1C in the last 72 hours. CBG: Recent Labs  Lab 06/16/18 1732  GLUCAP 119*   Lipid Profile: No results for input(s): CHOL, HDL, LDLCALC, TRIG, CHOLHDL, LDLDIRECT in the last 72 hours. Thyroid Function Tests: No results for input(s): TSH, T4TOTAL, FREET4, T3FREE, THYROIDAB in the last 72 hours. Anemia Panel: No results for input(s): VITAMINB12, FOLATE, FERRITIN, TIBC, IRON, RETICCTPCT in the last 72 hours. Urine analysis:    Component Value Date/Time   COLORURINE YELLOW 06/16/2018 [place] 06/16/2018 1706   LABSPEC 1.008 06/16/2018 1706   LABSPEC 1.020 07/24/2017 0847   PHURINE 6.0 06/16/2018 1706   GLUCOSEU NEGATIVE 06/16/2018 1706   GLUCOSEU Negative 07/24/2017 0847   HGBUR NEGATIVE 06/16/2018 1706   BILIRUBINUR NEGATIVE 06/16/2018 1706   BILIRUBINUR Negative 07/24/2017 0847   KETONESUR NEGATIVE 06/16/2018 1706    PROTEINUR NEGATIVE 06/16/2018 1706   UROBILINOGEN 0.2 07/24/2017 0847   NITRITE NEGATIVE 06/16/2018 1706   LEUKOCYTESUR NEGATIVE 06/16/2018 1706   LEUKOCYTESUR Negative 07/24/2017 0847   Sepsis Labs: @LABRCNTIP (procalcitonin:4,lacticidven:4) ) Recent Results (from the past 240 hour(s))  Blood culture (routine x 2)     Status: None (Preliminary result)   Collection Time: 06/16/18  5:24 PM  Result Value Ref Range Status   Specimen  Description   Final    BLOOD RIGHT ANTECUBITAL Performed at [facility] [address], [place], [place] [address]    Special Requests   Final    BOTTLES DRAWN AEROBIC AND ANAEROBIC Blood Culture adequate volume Performed at [facility], [place] [address] [place], [place] [address]    Culture PENDING  Incomplete   Report Status PENDING  Incomplete  Blood culture (routine x 2)     Status: None (Preliminary result)   Collection Time: 06/16/18  5:24 PM  Result Value Ref Range Status   Specimen Description   Final    BLOOD PORTA CATH Performed at [facility] [address] [place], [place] [address]    Special Requests   Final    BOTTLES DRAWN AEROBIC AND ANAEROBIC Blood Culture adequate volume Performed at [facility], [place] [address], [place], [place] [address]    Culture PENDING  Incomplete   Report Status PENDING  Incomplete     Radiological Exams on Admission: Dg Chest 2 View  Result Date: 06/16/2018 CLINICAL DATA:  Feeling unwell for 3 days.  On chemotherapy. EXAM: CHEST - 2 VIEW COMPARISON:  11/11/2017 FINDINGS: Porta catheter on the right with tip at the upper cavoatrial junction. There is no edema, consolidation, effusion, or pneumothorax. Normal heart size and mediastinal contours. IMPRESSION: No evidence of active disease in the chest. Electronically Signed   By: Monte Fantasia M.D.   On: 06/16/2018 18:13   Ct Abdomen Pelvis W Contrast  Result Date: 06/16/2018 CLINICAL DATA:  History  of pancreatic cancer.  Weakness. EXAM: CT ABDOMEN AND PELVIS WITH CONTRAST TECHNIQUE: Multidetector CT imaging of the abdomen and pelvis was performed using the standard protocol following bolus administration of intravenous contrast. CONTRAST:  52mL OMNIPAQUE IOHEXOL 300 MG/ML SOLN orally, 54mL OMNIPAQUE IOHEXOL 300 MG/ML SOLN intravenously. COMPARISON:  CT scan of May 04, 2018. FINDINGS: Lower chest: No acute abnormality. Hepatobiliary: Multiple low densities are noted in the right hepatic lobe which are enlarged compared to prior exam consistent with metastatic disease. The largest measures 3.0 x 2.3 cm in dome of right hepatic lobe. No gallstones are noted. Stable appearance of biliary stent extending from common hepatic duct into duodenum. Pancreas: Grossly stable ill-defined mass is noted measuring 6.8 x 3.5 cm which extends into retroperitoneal soft tissues. There remains atrophy of the pancreatic body and tail with associated pancreatic ductal dilatation. Spleen: Maximum measured diameter of 17.5 cm which is unchanged compared to prior exam. No focal abnormality is noted. Adrenals/Urinary Tract: Adrenal glands appear normal. Mild left hydronephrosis is noted without obstructing calculus. Mild urinary bladder distention is noted. No renal or ureteral calculi are noted. Stomach/Bowel: The stomach is unremarkable. Wall thickening of the duodenum and proximal jejunum is noted concerning for inflammation or edema. Stool is noted throughout the colon. No abnormal bowel dilatation is noted. The appendix is not visualized. Vascular/Lymphatic: Abdominal aorta is unremarkable. 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 Reproductive: Uterus and bilateral adnexa are unremarkable. Other: No abdominal wall hernia or abnormality. No abdominopelvic ascites. Musculoskeletal: No acute or significant osseous findings. IMPRESSION: Grossly stable  ill-defined mass is seen arising from the pancreas measuring 6.8 x 3.5 cm, which extends into surrounding retroperitoneal soft tissues. Multiple low densities are noted in the right hepatic lobe which are increased in size compared to prior exam and consistent with worsening metastatic disease. Biliary stent is again noted.  Continued wall thickening of duodenum and proximal jejunum is noted concerning for inflammation or edema. 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 Stable moderate splenomegaly. Mild left hydronephrosis is noted without obstructing calculus; potentially there may be ureteral occlusion secondary to retroperitoneal neoplasm. Electronically Signed   By: Marijo Conception, M.D.   On: 06/16/2018 21:32     EKG: Independently reviewed.  Sinus tachycardia, QTC 403, low voltage, early R wave progression, nonspecific T wave change.  Assessment/Plan Principal Problem:   Sepsis ([place]) Active Problems:   Prediabetes   Pancreatic cancer metastasized to liver ([facility])   Protein-calorie malnutrition, moderate (HCC)   Ascites   Anxiety   AKI (acute kidney injury) ([place])   Duodenitis   Hydronephrosis, left   Iron deficiency anemia   Sepsis [facility]): Patient meets criteria for sepsis with leukocytosis, fever, tachycardia and tachypnea.  Lactic acid normal 1.33, 0.86, currently blood pressure is soft, but hemodynamically stable.  Source of infection is not clear.  Patient has history of ascites, but no any abdominal pain, less likely to have SBP. CT scan showed wall thickening of duodenum and proximal jejunum, indicating possble duodenitis.  Since patient is immunosuppressed, broad antibiotics was started in ED.  -will admit to MedSurg bed as inpatient - Vancomycin, cefepime and Flagyl was started in ED, will continue. -Blood culture, urine culture -will get Procalcitonin and trend lactic acid levels per sepsis protocol. -IVF: 2L of NS bolus in ED, followed by 75 cc/h    Prediabetes: A1c was at 5.4 on 11/12/2017, not taking any medications at home.  Blood sugar 129. -Check CBG qAM  Pancreatic cancer metastasized to liver [facility]): Patient is getting chemotherapy every 3 weeks, last dose was on Wednesday. -f/u with Dr. Benay Spice -will add Dr. Gearldine Shown name to treatment team  Anxiety: -Continue home Ativan  AKI: Likely due to combination of dehydration and the left sided hydronephrosis.  CT scan showed mild left-sided hydronephrosis, but no obstructive stone, possibly due to ureteral occlusion secondary to retroperitoneal neoplasm. -on IVF as above -f/u renal Fx by BMP -if get worse, may need to consult urologist.  Hydronephrosis, left: -see above  Protein calorie malnutrition-moderate - Ensure  Ascites: -Hold Lasix and spironolactone due to sepsis  Iron deficiency anemia: Hemoglobin stable, 10.0 -Continue iron supplement   Inpatient status:  # Patient requires inpatient status due to high intensity of service, high risk for further deterioration and high frequency of surveillance required.  I certify that at the point of admission it is my clinical judgment that the patient will require inpatient hospital care spanning beyond 2 midnights from the point of admission.  . This patient has multiple chronic comorbidities including pancreatitis metastasized to liver on chemotherapy currently, ascites, prediabetes, anxiety, and deficiency anemia. . Now patient has presenting symptoms include generalized weakness, fever and chills. . The worrisome physical exam findings include chronic ill appearance, very thin body habitus, protein calorie malnutrition . The initial radiographic and laboratory data are worrisome because of metastasized pancreatic cancer, left hydronephrosis, possible duodenitis . Current medical needs: please see my assessment and plan . Predictability of an adverse outcome (risk): Patient is immunosuppressed, now presenting with sepsis  without clear source of infection. She is at high risk for deteriorating.       DVT ppx: Arixtra per pharm (due to religious reason, patient wants to avoid pork related medication) Code Status: Full code Family Communication:  Yes, patient's husband, mother    at bed side Disposition Plan:  Anticipate  discharge back to previous home environment Consults called: None Admission status: Inpatient/tele     Date of Service 06/16/2018    Ivor Costa [facility] Pager [phone]  If 7PM-7AM, please contact night-coverage [email or web address] Password [email or web address] 06/16/2018, 10:43 PM

## 2018-06-17 DIAGNOSIS — D649 Anemia, unspecified: Secondary | ICD-10-CM

## 2018-06-17 DIAGNOSIS — G893 Neoplasm related pain (acute) (chronic): Secondary | ICD-10-CM

## 2018-06-17 DIAGNOSIS — Z6821 Body mass index (BMI) 21.0-21.9, adult: Secondary | ICD-10-CM

## 2018-06-17 DIAGNOSIS — R627 Adult failure to thrive: Secondary | ICD-10-CM

## 2018-06-17 DIAGNOSIS — C259 Malignant neoplasm of pancreas, unspecified: Secondary | ICD-10-CM

## 2018-06-17 DIAGNOSIS — F419 Anxiety disorder, unspecified: Secondary | ICD-10-CM

## 2018-06-17 DIAGNOSIS — C787 Secondary malignant neoplasm of liver and intrahepatic bile duct: Secondary | ICD-10-CM

## 2018-06-17 LAB — CBC
HEMATOCRIT: 28 % — AB (ref 36.0–46.0)
Hemoglobin: 8.4 g/dL — ABNORMAL LOW (ref 12.0–15.0)
MCH: 23.6 pg — ABNORMAL LOW (ref 26.0–34.0)
MCHC: 30 g/dL (ref 30.0–36.0)
MCV: 78.7 fL — AB (ref 80.0–100.0)
Platelets: 177 10*3/uL (ref 150–400)
RBC: 3.56 MIL/uL — ABNORMAL LOW (ref 3.87–5.11)
RDW: 20.6 % — AB (ref 11.5–15.5)
WBC: 17.7 10*3/uL — AB (ref 4.0–10.5)
nRBC: 0 % (ref 0.0–0.2)

## 2018-06-17 LAB — BASIC METABOLIC PANEL
Anion gap: 7 (ref 5–15)
BUN: 30 mg/dL — AB (ref 6–20)
CHLORIDE: 107 mmol/L (ref 98–111)
CO2: 21 mmol/L — ABNORMAL LOW (ref 22–32)
CREATININE: 1.21 mg/dL — AB (ref 0.44–1.00)
Calcium: 8 mg/dL — ABNORMAL LOW (ref 8.9–10.3)
GFR calc Af Amer: 59 mL/min — ABNORMAL LOW (ref >60)
GFR calc non Af Amer: 51 mL/min — ABNORMAL LOW (ref >60)
Glucose, Bld: 181 mg/dL — ABNORMAL HIGH (ref 70–99)
Potassium: 4.2 mmol/L (ref 3.5–5.1)
SODIUM: 135 mmol/L (ref 135–145)

## 2018-06-17 LAB — CBG MONITORING, ED: Glucose-Capillary: 147 mg/dL — ABNORMAL HIGH (ref 70–99)

## 2018-06-17 LAB — LACTIC ACID, PLASMA: Lactic Acid, Venous: 1.8 mmol/L (ref 0.5–1.9)

## 2018-06-17 LAB — HIV ANTIBODY (ROUTINE TESTING W REFLEX): HIV Screen 4th Generation wRfx: NONREACTIVE

## 2018-06-17 LAB — HCG, QUANTITATIVE, PREGNANCY

## 2018-06-17 LAB — CORTISOL-AM, BLOOD: CORTISOL - AM: 13.5 ug/dL (ref 6.7–22.6)

## 2018-06-17 LAB — PROCALCITONIN: Procalcitonin: 0.48 ng/mL

## 2018-06-17 MED ORDER — SODIUM CHLORIDE 0.9 % IV SOLN
1.0000 g | Freq: Two times a day (BID) | INTRAVENOUS | Status: DC
  Administered 2018-06-17 – 2018-06-18 (×4): 1 g via INTRAVENOUS
  Filled 2018-06-17 (×5): qty 1

## 2018-06-17 MED ORDER — VANCOMYCIN HCL IN DEXTROSE 1-5 GM/200ML-% IV SOLN
1000.0000 mg | INTRAVENOUS | Status: DC
  Administered 2018-06-17 – 2018-06-19 (×2): 1000 mg via INTRAVENOUS
  Filled 2018-06-17 (×2): qty 200

## 2018-06-17 MED ORDER — ZOLPIDEM TARTRATE 5 MG PO TABS
5.0000 mg | ORAL_TABLET | Freq: Every evening | ORAL | Status: DC | PRN
  Administered 2018-06-17 – 2018-06-20 (×4): 5 mg via ORAL
  Filled 2018-06-17 (×4): qty 1

## 2018-06-17 NOTE — Progress Notes (Addendum)
PROGRESS NOTE    Crystal Carlson  ZOX:[id number] DOB: Apr 23, 1966 DOA: 06/16/2018 PCP: Mack Hook, MD   Brief Narrative:  52 year old with history of stage IV metastatic pancreatic cancer on chemotherapy, malignant ascites, prediabetes, iron deficiency anemia came to the hospital with complains of subjective fevers and chills along with weakness.  Upon admission she was found to have elevated WBC along with acute kidney injury, low-grade fever, tachycardia and tachypnea.  Chest x-ray was negative.  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   Assessment & Plan:   Principal Problem:   Sepsis ([place]) Active Problems:   Prediabetes   Pancreatic cancer metastasized to liver (HCC)   Protein-calorie malnutrition, moderate (HCC)   Ascites   Anxiety   AKI (acute kidney injury) ([place])   Duodenitis   Hydronephrosis, left   Iron deficiency anemia  Sepsis, unknown etiology.  With acute organ dysfunction-acute kidney injury - With IV fluids she is feeling better and her blood pressure has stabilized along with her heart rate. -Cultures drawn-pending, urine cultures-pending but UA is negative -Continue IV fluids, monitor urine output. -Continue vancomycin, cefepime and Flagyl.  Will tailor rate according to culture data.  Procalcitonin 0.4  Metastatic stage IV pancreatic cancer Malignant ascites -Currently on chemotherapy.  CT scan shows extensive metastatic disease.  Patient would likely benefit from palliative care consultation but will defer this to oncology to see when this is appropriate - Currently Lasix and Aldactone on hold due to sepsis  Acute kidney injury Mild left-sided hydronephrosis secondary to occlusion from neoplasm - Continue monitor renal function with IV fluids.  It has improved a little from  1.4-1.2.  Baseline 0.7.  If stops having urine output or worsening renal function, will benefit from urology consultation for possible stent.  Moderate protein calorie malnutrition -Boost.  Consult dietitian  Iron supplements - Iron supplements  DVT prophylaxis: Arixtra Code Status: Full code Family Communication: Mother at bedside Disposition Plan: Maintain inpatient stay  Consultants:   Oncology-Dr. Learta Codding  Procedures:   None  Antimicrobials:   On vancomycin cefepime and Flagyl   Subjective: Patient states she feels slightly better this morning after getting IV fluids but still appears and feels extremely weak.  Review of Systems Otherwise negative except as per HPI, including: General: Denies fever, chills, night sweats or unintended weight loss. Resp: Denies cough, wheezing, shortness of breath. Cardiac: Denies chest pain, palpitations, orthopnea, paroxysmal nocturnal dyspnea. GI: Denies abdominal pain, nausea, vomiting, diarrhea or constipation GU: Denies dysuria, frequency, hesitancy or incontinence MS: Denies muscle aches, joint pain or swelling Neuro: Denies headache, neurologic deficits (focal weakness, numbness, tingling), abnormal gait Psych: Denies anxiety, depression, SI/HI/AVH Skin: Denies new rashes or lesions ID: Denies sick contacts, exotic exposures, travel  Objective: Vitals:   06/17/18 0930 06/17/18 0945 06/17/18 1112 06/17/18 1123  BP: 104/83 111/82 107/82 96/76  Pulse: 83 71 78   Resp: (!) 23 (!) 29 (!) 23   Temp:      TempSrc:      SpO2: 100% 100% 100%   Weight:        Intake/Output Summary (Last 24 hours) at 06/17/2018 1148 Last data filed at 06/17/2018 1127 Gross per 24 hour  Intake 2683.5 ml  Output 11 ml  Net 2672.5 ml   Filed Weights   06/16/18 2238  Weight: 49.9 kg    Examination:  General exam: Appears  calm and comfortable, extremely weak and frail appearing.  Dry mouth Respiratory system: Clear to auscultation.  Respiratory effort normal. Cardiovascular system: S1 & S2 heard, RRR. No JVD, murmurs, rubs, gallops or clicks. No pedal edema. Gastrointestinal system: Abdomen is nondistended, soft and nontender. No organomegaly or masses felt. Normal bowel sounds heard. Central nervous system: Alert and oriented. No focal neurological deficits. Extremities: Symmetric 4 x 5 power. Skin: No rashes, lesions or ulcers Psychiatry: Judgement and insight appear normal. Mood & affect appropriate.     Data Reviewed:   CBC: Recent Labs  Lab 06/16/18 1706 06/17/18 0437  WBC 21.3* 17.7*  HGB 10.0* 8.4*  HCT 33.2* 28.0*  MCV 78.1* 78.7*  PLT 250 956   Basic Metabolic Panel: Recent Labs  Lab 06/16/18 1724 06/17/18 0437  NA 131* 135  K 4.2 4.2  CL 99 107  CO2 22 21*  GLUCOSE 129* 181*  BUN 31* 30*  CREATININE 1.43* 1.21*  CALCIUM 8.7* 8.0*   GFR: Estimated Creatinine Clearance: 43.8 mL/min (A) (by C-G formula based on SCr of 1.21 mg/dL (H)). Liver Function Tests: Recent Labs  Lab 06/16/18 1724  AST 42*  ALT 29  ALKPHOS 311*  BILITOT 1.5*  PROT 7.6  ALBUMIN 2.3*   Recent Labs  Lab 06/16/18 1724  LIPASE 23   No results for input(s): AMMONIA in the last 168 hours. Coagulation Profile: Recent Labs  Lab 06/16/18 1724  INR 1.34   Cardiac Enzymes: No results for input(s): CKTOTAL, CKMB, CKMBINDEX, TROPONINI in the last 168 hours. BNP (last 3 results) No results for input(s): PROBNP in the last 8760 hours. HbA1C: No results for input(s): HGBA1C in the last 72 hours. CBG: Recent Labs  Lab 06/16/18 1732 06/17/18 0930  GLUCAP 119* 147*   Lipid Profile: No results for input(s): CHOL, HDL, LDLCALC, TRIG, CHOLHDL, LDLDIRECT in the last 72 hours. Thyroid Function Tests: No results for input(s): TSH, T4TOTAL, FREET4, T3FREE, THYROIDAB in the last 72 hours. Anemia Panel: No results for input(s): VITAMINB12, FOLATE, FERRITIN, TIBC, IRON, RETICCTPCT in the last 72 hours. Sepsis  Labs: Recent Labs  Lab 06/16/18 1835 06/16/18 2018 06/16/18 2223 06/16/18 2224  PROCALCITON  --   --  0.48  --   LATICACIDVEN 1.33 0.86  --  1.8    Recent Results (from the past 240 hour(s))  Blood culture (routine x 2)     Status: None (Preliminary result)   Collection Time: 06/16/18  5:24 PM  Result Value Ref Range Status   Specimen Description   Final    BLOOD RIGHT ANTECUBITAL Performed at [facility], [place] [address] [place], [place] [address]    Special Requests   Final    BOTTLES DRAWN AEROBIC AND ANAEROBIC Blood Culture adequate volume Performed at [facility], [place] [address] [place], [place] [address]    Culture PENDING  Incomplete   Report Status PENDING  Incomplete  Blood culture (routine x 2)     Status: None (Preliminary result)   Collection Time: 06/16/18  5:24 PM  Result Value Ref Range Status   Specimen Description   Final    BLOOD PORTA CATH Performed at [place] [address], [place], [place] [address]    Special Requests   Final    BOTTLES DRAWN AEROBIC AND ANAEROBIC Blood Culture adequate volume Performed at [facility], [place] [address], [place],  [address]    Culture PENDING  Incomplete   Report Status  PENDING  Incomplete         Radiology Studies: Dg Chest 2 View  Result Date: 06/16/2018 CLINICAL DATA:  Feeling unwell for 3 days.  On chemotherapy. EXAM: CHEST - 2 VIEW COMPARISON:  11/11/2017 FINDINGS: Porta catheter on the right with tip at the upper cavoatrial junction. There is no edema, consolidation, effusion, or pneumothorax. Normal heart size and mediastinal contours. IMPRESSION: No evidence of active disease in the chest. Electronically Signed   By: Monte Fantasia M.D.   On: 06/16/2018 18:13   Ct Abdomen Pelvis W Contrast  Result Date: 06/16/2018 CLINICAL DATA:  History of pancreatic cancer.  Weakness. EXAM: CT ABDOMEN AND PELVIS WITH CONTRAST TECHNIQUE: Multidetector  CT imaging of the abdomen and pelvis was performed using the standard protocol following bolus administration of intravenous contrast. CONTRAST:  94mL OMNIPAQUE IOHEXOL 300 MG/ML SOLN orally, 13mL OMNIPAQUE IOHEXOL 300 MG/ML SOLN intravenously. COMPARISON:  CT scan of May 04, 2018. FINDINGS: Lower chest: No acute abnormality. Hepatobiliary: Multiple low densities are noted in the right hepatic lobe which are enlarged compared to prior exam consistent with metastatic disease. The largest measures 3.0 x 2.3 cm in dome of right hepatic lobe. No gallstones are noted. Stable appearance of biliary stent extending from common hepatic duct into duodenum. Pancreas: Grossly stable ill-defined mass is noted measuring 6.8 x 3.5 cm which extends into retroperitoneal soft tissues. There remains atrophy of the pancreatic body and tail with associated pancreatic ductal dilatation. Spleen: Maximum measured diameter of 17.5 cm which is unchanged compared to prior exam. No focal abnormality is noted. Adrenals/Urinary Tract: Adrenal glands appear normal. Mild left hydronephrosis is noted without obstructing calculus. Mild urinary bladder distention is noted. No renal or ureteral calculi are noted. Stomach/Bowel: The stomach is unremarkable. Wall thickening of the duodenum and proximal jejunum is noted concerning for inflammation or edema. Stool is noted throughout the colon. No abnormal bowel dilatation is noted. The appendix is not visualized. Vascular/Lymphatic: Abdominal aorta is unremarkable. 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 Reproductive: Uterus and bilateral adnexa are unremarkable. Other: No abdominal wall hernia or abnormality. No abdominopelvic ascites. Musculoskeletal: No acute or significant osseous findings. IMPRESSION: Grossly stable ill-defined mass is seen arising from the pancreas measuring 6.8 x 3.5 cm, which extends into surrounding  retroperitoneal soft tissues. Multiple low densities are noted in the right hepatic lobe which are increased in size compared to prior exam and consistent with worsening metastatic disease. Biliary stent is again noted. Continued wall thickening of duodenum and proximal jejunum is noted concerning for inflammation or edema. 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 Stable moderate splenomegaly. Mild left hydronephrosis is noted without obstructing calculus; potentially there may be ureteral occlusion secondary to retroperitoneal neoplasm. Electronically Signed   By: Marijo Conception, M.D.   On: 06/16/2018 21:32        Scheduled Meds: . ferrous sulfate  325 mg Oral Q1400  . fondaparinux (ARIXTRA) injection  2.5 mg Subcutaneous Daily  . morphine  15 mg Oral Q12H  . polyethylene glycol  17 g Oral Daily  . predniSONE  10 mg Oral BID WC   Continuous Infusions: . sodium chloride 75 mL/hr at 06/17/18 0124  . ceFEPime (MAXIPIME) IV Stopped (06/17/18 1127)  . metronidazole Stopped (06/17/18 0250)  . vancomycin       LOS: 1 day   Time spent= 40 mins    Jaycub Noorani Arsenio Loader, MD [facility] Pager  [phone]   If 7PM-7AM, please contact night-coverage [email or web address] Password [email or web address] 06/17/2018, 11:48 AM

## 2018-06-17 NOTE — Plan of Care (Signed)
  Problem: Nutrition: Goal: Adequate nutrition will be maintained Outcome: Progressing   Problem: Elimination: Goal: Will not experience complications related to bowel motility Outcome: Progressing   Problem: Pain Managment: Goal: General experience of comfort will improve Outcome: Progressing   

## 2018-06-17 NOTE — ED Notes (Signed)
ED TO INPATIENT HANDOFF REPORT  Name/Age/Gender Crystal Carlson 52 y.o. female  Code Status    Code Status Orders  (From admission, onward)         Start     Ordered   06/16/18 2226  Full code  Continuous     06/16/18 2225        Code Status History    Date Active Date Inactive Code Status Order ID Comments User Context   05/17/2018 1402 05/18/2018 2056 Full Code [id number]  Earnie Larsson, MD Inpatient   01/30/2018 1739 02/02/2018 1623 Full Code [id number]  Georgette Shell, MD ED   11/11/2017 1909 11/14/2017 1634 Full Code [id number]  Toy Baker, MD Inpatient   08/31/2017 2025 09/04/2017 1639 Full Code [id number]  Norval Morton, MD Inpatient   07/24/2017 1743 07/27/2017 1722 Full Code [id number]  Kinnie Feil, MD Inpatient   05/25/2017 0650 05/27/2017 2034 Full Code [id number]  [place], Lezlie Octave, NP ED      Home/SNF/Other Home  Chief Complaint Chemo Card; Weakness  Level of Care/Admitting Diagnosis ED Disposition    ED Disposition Condition [place]: [facility] [[id number]]  Level of Care: Med-Surg [16]  Diagnosis: Sepsis [facility]) [[id number]]  Admitting Physician: Ivor Costa [place]  Attending Physician: Ivor Costa [[id number]]  Estimated length of stay: past midnight tomorrow  Certification:: I certify this patient will need inpatient services for at least 2 midnights  PT Class (Do Not Modify): Inpatient [101]  PT Acc Code (Do Not Modify): Private [1]       Medical History Past Medical History:  Diagnosis Date  . Anxiety   . Cancer of pancreas ([place]) 05/26/2017   chemo  . Chronic pancreatitis ([place]) 05/19/2017  . Depression   . Edema    LE  . Liver metastases ([place]) 05/26/2017  . Microcytic anemia 05/19/2017  . Prediabetes 2016   when living in [place]  Allergen Reactions  . Pork-Derived Products     RELIGIOUS BELIEF -  NO PORK    IV Location/Drains/Wounds Patient Lines/Drains/Airways Status    Active Line/Drains/Airways    Name:   Placement date:   Placement time:   Site:   Days:   Implanted Port Right Chest   -    -    Chest      Incision (Closed) 05/17/18 Back   05/17/18    1218     31          Labs/Imaging Results for orders placed or performed during the hospital encounter of 06/16/18 (from the past 48 hour(s))  CBC     Status: Abnormal   Collection Time: 06/16/18  5:06 PM  Result Value Ref Range   WBC 21.3 (H) 4.0 - 10.5 K/uL   RBC 4.25 3.87 - 5.11 MIL/uL   Hemoglobin 10.0 (L) 12.0 - 15.0 g/dL   HCT 33.2 (L) 36.0 - 46.0 %   MCV 78.1 (L) 80.0 - 100.0 fL   MCH 23.5 (L) 26.0 - 34.0 pg   MCHC 30.1 30.0 - 36.0 g/dL   RDW 21.2 (H) 11.5 - 15.5 %   Platelets 250 150 - 400 K/uL   nRBC 0.0 0.0 - 0.2 %    Comment: Performed at [facility], [place] [address], [place], [place] [address]  Urinalysis, Routine w reflex microscopic     Status: None   Collection Time: 06/16/18  5:06 PM  Result Value Ref Range  Color, Urine YELLOW YELLOW   APPearance CLEAR CLEAR   Specific Gravity, Urine 1.008 1.005 - 1.030   pH 6.0 5.0 - 8.0   Glucose, UA NEGATIVE NEGATIVE mg/dL   Hgb urine dipstick NEGATIVE NEGATIVE   Bilirubin Urine NEGATIVE NEGATIVE   Ketones, ur NEGATIVE NEGATIVE mg/dL   Protein, ur NEGATIVE NEGATIVE mg/dL   Nitrite NEGATIVE NEGATIVE   Leukocytes, UA NEGATIVE NEGATIVE    Comment: Performed at [facility], [place] [address] [place], [place] [address]  Comprehensive metabolic panel     Status: Abnormal   Collection Time: 06/16/18  5:24 PM  Result Value Ref Range   Sodium 131 (L) 135 - 145 mmol/L   Potassium 4.2 3.5 - 5.1 mmol/L   Chloride 99 98 - 111 mmol/L   CO2 22 22 - 32 mmol/L   Glucose, Bld 129 (H) 70 - 99 mg/dL   BUN 31 (H) 6 - 20 mg/dL   Creatinine, Ser 1.43 (H) 0.44 - 1.00 mg/dL   Calcium 8.7 (L) 8.9 - 10.3 mg/dL   Total Protein 7.6 6.5 - 8.1 g/dL   Albumin 2.3 (L) 3.5 - 5.0 g/dL   AST 42 (H) 15 - 41 U/L   ALT 29 0 -  44 U/L   Alkaline Phosphatase 311 (H) 38 - 126 U/L   Total Bilirubin 1.5 (H) 0.3 - 1.2 mg/dL   GFR calc non Af Amer 42 (L) >60 mL/min   GFR calc Af Amer 49 (L) >60 mL/min    Comment: (NOTE) The eGFR has been calculated using the CKD EPI equation. This calculation has not been validated in all clinical situations. eGFR's persistently <60 mL/min signify possible Chronic Kidney Disease.    Anion gap 10 5 - 15    Comment: Performed at [facility], [place] [address] [place], [place] [address]  Blood culture (routine x 2)     Status: None (Preliminary result)   Collection Time: 06/16/18  5:24 PM  Result Value Ref Range   Specimen Description      BLOOD RIGHT ANTECUBITAL Performed at [facility], [place] [address], [place], [place] [address]    Special Requests      BOTTLES DRAWN AEROBIC AND ANAEROBIC Blood Culture adequate volume Performed at [facility], [place] [address] [place], [place] [address]    Culture PENDING    Report Status PENDING   Blood culture (routine x 2)     Status: None (Preliminary result)   Collection Time: 06/16/18  5:24 PM  Result Value Ref Range   Specimen Description      BLOOD PORTA CATH Performed at [place] [address], [place], [place] [address]    Special Requests      BOTTLES DRAWN AEROBIC AND ANAEROBIC Blood Culture adequate volume Performed at [facility], [place] [address], [place], [place] [address]    Culture PENDING    Report Status PENDING   Lipase, blood     Status: None   Collection Time: 06/16/18  5:24 PM  Result Value Ref Range   Lipase 23 11 - 51 U/L    Comment: Performed at [facility], [place] [address] [place], [place] [address]  Protime-INR     Status: Abnormal   Collection Time: 06/16/18  5:24 PM  Result Value Ref Range   Prothrombin Time 16.4 (H) 11.4 - 15.2 seconds   INR 1.34     Comment: Performed at [facility]  [place] [address] [place], [place] [address]  CBG monitoring, ED     Status: Abnormal   Collection Time: 06/16/18  5:32 PM  Result Value Ref Range   Glucose-Capillary 119 (H) 70 - 99 mg/dL  I-Stat CG4 Lactic Acid, ED     Status: None   Collection Time: 06/16/18  6:35 PM  Result Value Ref Range   Lactic Acid, Venous 1.33 0.5 - 1.9 mmol/L  I-Stat CG4 Lactic Acid, ED     Status: None   Collection Time: 06/16/18  8:18 PM  Result Value Ref Range   Lactic Acid, Venous 0.86 0.5 - 1.9 mmol/L  hCG, quantitative, pregnancy     Status: None   Collection Time: 06/16/18 10:23 PM  Result Value Ref Range   hCG, Beta Chain, Quant, S <1 <5 mIU/mL    Comment:          GEST. AGE      CONC.  (mIU/mL)   <=1 WEEK        5 - 50     2 WEEKS       50 - 500     3 WEEKS       100 - 10,000     4 WEEKS     1,000 - 30,000     5 WEEKS     3,500 - 115,000   6-8 WEEKS     12,000 - 270,000    12 WEEKS     15,000 - 220,000        FEMALE AND NON-PREGNANT FEMALE:     LESS THAN 5 mIU/mL Performed at [facility], [place] [address] [place], [place] [address]   Procalcitonin     Status: None   Collection Time: 06/16/18 10:23 PM  Result Value Ref Range   Procalcitonin 0.48 ng/mL    Comment:        Interpretation: PCT (Procalcitonin) <= 0.5 ng/mL: Systemic infection (sepsis) is not likely. Local bacterial infection is possible. (NOTE)       Sepsis PCT Algorithm           Lower Respiratory Tract                                      Infection PCT Algorithm    ----------------------------     ----------------------------         PCT < 0.25 ng/mL                PCT < 0.10 ng/mL         Strongly encourage             Strongly discourage   discontinuation of antibiotics    initiation of antibiotics    ----------------------------     -----------------------------       PCT 0.25 - 0.50 ng/mL            PCT 0.10 - 0.25 ng/mL               OR       >80% decrease in PCT            Discourage initiation of  antibiotics      Encourage discontinuation           of antibiotics    ----------------------------     -----------------------------         PCT >= 0.50 ng/mL              PCT 0.26 - 0.50 ng/mL               AND        <80% decrease in PCT             Encourage initiation of                                             antibiotics       Encourage continuation           of antibiotics    ----------------------------     -----------------------------        PCT >= 0.50 ng/mL                  PCT > 0.50 ng/mL               AND         increase in PCT                  Strongly encourage                                      initiation of antibiotics    Strongly encourage escalation           of antibiotics                                     -----------------------------                                           PCT <= 0.25 ng/mL                                                 OR                                        > 80% decrease in PCT                                     Discontinue / Do not initiate                                             antibiotics Performed at [place] [address], [place], [place] [address]   Lactic acid, plasma     Status: None   Collection Time: 06/16/18  10:24 PM  Result Value Ref Range   Lactic Acid, Venous 1.8 0.5 - 1.9 mmol/L    Comment: Performed at [facility], [place] [address] [place] [address], [place], [place] [address]  Cortisol-am, blood     Status: None   Collection Time: 06/17/18  4:37 AM  Result Value Ref Range   Cortisol - AM 13.5 6.7 - 22.6 ug/dL    Comment: Performed at [facility], [place] [address] [place], [place] [address]  Basic metabolic panel     Status: Abnormal   Collection Time: 06/17/18  4:37 AM  Result Value Ref Range   Sodium 135 135 - 145 mmol/L   Potassium 4.2 3.5 - 5.1 mmol/L   Chloride 107 98 - 111 mmol/L   CO2 21 (L) 22 - 32 mmol/L   Glucose,  Bld 181 (H) 70 - 99 mg/dL   BUN 30 (H) 6 - 20 mg/dL   Creatinine, Ser 1.21 (H) 0.44 - 1.00 mg/dL   Calcium 8.0 (L) 8.9 - 10.3 mg/dL   GFR calc non Af Amer 51 (L) >60 mL/min   GFR calc Af Amer 59 (L) >60 mL/min    Comment: (NOTE) The eGFR has been calculated using the CKD EPI equation. This calculation has not been validated in all clinical situations. eGFR's persistently <60 mL/min signify possible Chronic Kidney Disease.    Anion gap 7 5 - 15    Comment: Performed at [facility], [place] [address], [place], [place] [address]  CBC     Status: Abnormal   Collection Time: 06/17/18  4:37 AM  Result Value Ref Range   WBC 17.7 (H) 4.0 - 10.5 K/uL   RBC 3.56 (L) 3.87 - 5.11 MIL/uL   Hemoglobin 8.4 (L) 12.0 - 15.0 g/dL   HCT 28.0 (L) 36.0 - 46.0 %   MCV 78.7 (L) 80.0 - 100.0 fL   MCH 23.6 (L) 26.0 - 34.0 pg   MCHC 30.0 30.0 - 36.0 g/dL   RDW 20.6 (H) 11.5 - 15.5 %   Platelets 177 150 - 400 K/uL   nRBC 0.0 0.0 - 0.2 %    Comment: Performed at [facility], [place] [address], [place], [place] [address]  CBG monitoring, ED     Status: Abnormal   Collection Time: 06/17/18  9:30 AM  Result Value Ref Range   Glucose-Capillary 147 (H) 70 - 99 mg/dL   Dg Chest 2 View  Result Date: 06/16/2018 CLINICAL DATA:  Feeling unwell for 3 days.  On chemotherapy. EXAM: CHEST - 2 VIEW COMPARISON:  11/11/2017 FINDINGS: Porta catheter on the right with tip at the upper cavoatrial junction. There is no edema, consolidation, effusion, or pneumothorax. Normal heart size and mediastinal contours. IMPRESSION: No evidence of active disease in the chest. Electronically Signed   By: Monte Fantasia M.D.   On: 06/16/2018 18:13   Ct Abdomen Pelvis W Contrast  Result Date: 06/16/2018 CLINICAL DATA:  History of pancreatic cancer.  Weakness. EXAM: CT ABDOMEN AND PELVIS WITH CONTRAST TECHNIQUE: Multidetector CT imaging of the abdomen and pelvis was performed using the standard protocol  following bolus administration of intravenous contrast. CONTRAST:  56m OMNIPAQUE IOHEXOL 300 MG/ML SOLN orally, 736mOMNIPAQUE IOHEXOL 300 MG/ML SOLN intravenously. COMPARISON:  CT scan of May 04, 2018. FINDINGS: Lower chest: No acute abnormality. Hepatobiliary: Multiple low densities are noted in the right hepatic lobe which are enlarged compared to prior exam consistent with metastatic disease. The largest measures 3.0  x 2.3 cm in dome of right hepatic lobe. No gallstones are noted. Stable appearance of biliary stent extending from common hepatic duct into duodenum. Pancreas: Grossly stable ill-defined mass is noted measuring 6.8 x 3.5 cm which extends into retroperitoneal soft tissues. There remains atrophy of the pancreatic body and tail with associated pancreatic ductal dilatation. Spleen: Maximum measured diameter of 17.5 cm which is unchanged compared to prior exam. No focal abnormality is noted. Adrenals/Urinary Tract: Adrenal glands appear normal. Mild left hydronephrosis is noted without obstructing calculus. Mild urinary bladder distention is noted. No renal or ureteral calculi are noted. Stomach/Bowel: The stomach is unremarkable. Wall thickening of the duodenum and proximal jejunum is noted concerning for inflammation or edema. Stool is noted throughout the colon. No abnormal bowel dilatation is noted. The appendix is not visualized. Vascular/Lymphatic: Abdominal aorta is unremarkable. 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 Reproductive: Uterus and bilateral adnexa are unremarkable. Other: No abdominal wall hernia or abnormality. No abdominopelvic ascites. Musculoskeletal: No acute or significant osseous findings. IMPRESSION: Grossly stable ill-defined mass is seen arising from the pancreas measuring 6.8 x 3.5 cm, which extends into surrounding retroperitoneal soft tissues. Multiple low densities are noted in the right  hepatic lobe which are increased in size compared to prior exam and consistent with worsening metastatic disease. Biliary stent is again noted. Continued wall thickening of duodenum and proximal jejunum is noted concerning for inflammation or edema. 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 Stable moderate splenomegaly. Mild left hydronephrosis is noted without obstructing calculus; potentially there may be ureteral occlusion secondary to retroperitoneal neoplasm. Electronically Signed   By: Marijo Conception, M.D.   On: 06/16/2018 21:32   EKG Interpretation  Date/Time:  Wednesday June 16 2018 21:57:20 EDT Ventricular Rate:  82 PR Interval:    QRS Duration: 91 QT Interval:  377 QTC Calculation: 441 R Axis:   91 Text Interpretation:  Sinus rhythm Borderline right axis deviation Nonspecific T abnormalities, lateral leads No significant change since last tracing Confirmed by Wandra Arthurs [phone]) on 06/16/2018 10:50:11 PM   Pending Labs Unresulted Labs (From admission, onward)    Start     Ordered   06/16/18 2225  HIV antibody (Routine Testing)  Once,   R     06/16/18 2225   06/16/18 1702  Urine culture  STAT,   STAT     06/16/18 1701          Vitals/Pain Today's Vitals   06/17/18 0930 06/17/18 0945 06/17/18 1112 06/17/18 1123  BP: 104/83 111/82 107/82 96/76  Pulse: 83 71 78   Resp: (!) 23 (!) 29 (!) 23   Temp:      TempSrc:      SpO2: 100% 100% 100%   Weight:      PainSc:        Isolation Precautions No active isolations  Medications Medications  metroNIDAZOLE (FLAGYL) IVPB 500 mg (0 mg Intravenous Stopped 06/17/18 0250)  HYDROmorphone (DILAUDID) tablet 4-8 mg (has no administration in time range)  morphine (MS CONTIN) 12 hr tablet 15 mg (0 mg Oral Hold 06/17/18 0008)  LORazepam (ATIVAN) tablet 0.5 mg (0.5 mg Oral Given 06/17/18 0109)  predniSONE (DELTASONE) tablet 10 mg (10 mg Oral Given 06/17/18 0800)  ferrous sulfate tablet 325 mg (has no  administration in time range)  lidocaine-prilocaine (EMLA) cream 1 application (has no administration in time range)  polyethylene glycol (MIRALAX / GLYCOLAX) packet  17 g (has no administration in time range)  senna (SENOKOT) tablet 8.6 mg (has no administration in time range)  acetaminophen (TYLENOL) tablet 650 mg (has no administration in time range)    Or  acetaminophen (TYLENOL) suppository 650 mg (has no administration in time range)  ondansetron (ZOFRAN) tablet 4 mg (has no administration in time range)    Or  ondansetron (ZOFRAN) injection 4 mg (has no administration in time range)  0.9 %  sodium chloride infusion ( Intravenous Rate/Dose Verify 06/17/18 0124)  fondaparinux (ARIXTRA) injection 2.5 mg (has no administration in time range)  ceFEPIme (MAXIPIME) 1 g in sodium chloride 0.9 % 100 mL IVPB (0 g Intravenous Stopped 06/17/18 1127)  vancomycin (VANCOCIN) IVPB 1000 mg/200 mL premix (has no administration in time range)  zolpidem (AMBIEN) tablet 5 mg (5 mg Oral Given 06/17/18 0503)  sodium chloride 0.9 % bolus 1,000 mL (0 mLs Intravenous Stopped 06/16/18 2010)  ceFEPIme (MAXIPIME) 2 g in sodium chloride 0.9 % 100 mL IVPB (0 g Intravenous Stopped 06/16/18 1903)  vancomycin (VANCOCIN) IVPB 1000 mg/200 mL premix (0 mg Intravenous Stopped 06/16/18 2334)  iohexol (OMNIPAQUE) 300 MG/ML solution 75 mL (75 mLs Intravenous Contrast Given 06/16/18 2037)  iohexol (OMNIPAQUE) 300 MG/ML solution 30 mL (30 mLs Oral Contrast Given 06/16/18 2036)  magnesium citrate solution 0.75 Bottle (0.75 Bottles Oral Given 06/16/18 2351)  sodium chloride 0.9 % bolus 1,000 mL (0 mLs Intravenous Stopped 06/17/18 0042)    Mobility walks with person assist

## 2018-06-17 NOTE — ED Notes (Signed)
Bed: WA17 Expected date:  Expected time:  Means of arrival:  Comments: rm 2

## 2018-06-17 NOTE — Progress Notes (Signed)
IP PROGRESS NOTE  Subjective:   Crystal Carlson is well-known to me with a history of metastatic pancreas cancer.  She completed a cycle of gemcitabine/Abraxane on 06/02/2018.  She reports no acute problem following chemotherapy. She reports feeling "weak "for the past few days.  She had one episode of fever to 101 degrees at home.  She presented to the ER yesterday for further evaluation. She denies pain.  The abdomen is less distended.  Objective: Vital signs in last 24 hours: Blood pressure 122/80, pulse 77, temperature 97.8 F (36.6 C), temperature source Oral, resp. rate (!) 22, weight 110 lb (49.9 kg), SpO2 100 %.  Intake/Output from previous day: 10/30 0701 - 10/31 0700 In: 2583.5 [I.V.:188.7; IV Piggyback:2394.8] Out: 11 [Urine:11]  Physical Exam:  HEENT: The mouth is dry, no thrush or ulcers Lungs: Clear bilaterally Cardiac: Regular rate and rhythm Abdomen: Nondistended, no hepatomegaly, nontender, no mass at the umbilicus Extremities: No leg edema Neurologic: Alert, follows commands, the motor exam appears intact in both lower extremities  Portacath/PICC-without erythema  Lab Results: Recent Labs    06/16/18 1706 06/17/18 0437  WBC 21.3* 17.7*  HGB 10.0* 8.4*  HCT 33.2* 28.0*  PLT 250 177    BMET Recent Labs    06/16/18 1724 06/17/18 0437  NA 131* 135  K 4.2 4.2  CL 99 107  CO2 22 21*  GLUCOSE 129* 181*  BUN 31* 30*  CREATININE 1.43* 1.21*  CALCIUM 8.7* 8.0*    Lab Results  Component Value Date   CEA1 1.3 05/25/2017    Studies/Results: Dg Chest 2 View  Result Date: 06/16/2018 CLINICAL DATA:  Feeling unwell for 3 days.  On chemotherapy. EXAM: CHEST - 2 VIEW COMPARISON:  11/11/2017 FINDINGS: Porta catheter on the right with tip at the upper cavoatrial junction. There is no edema, consolidation, effusion, or pneumothorax. Normal heart size and mediastinal contours. IMPRESSION: No evidence of active disease in the chest. Electronically Signed   By:  Monte Fantasia M.D.   On: 06/16/2018 18:13   Ct Abdomen Pelvis W Contrast  Result Date: 06/16/2018 CLINICAL DATA:  History of pancreatic cancer.  Weakness. EXAM: CT ABDOMEN AND PELVIS WITH CONTRAST TECHNIQUE: Multidetector CT imaging of the abdomen and pelvis was performed using the standard protocol following bolus administration of intravenous contrast. CONTRAST:  69mL OMNIPAQUE IOHEXOL 300 MG/ML SOLN orally, 70mL OMNIPAQUE IOHEXOL 300 MG/ML SOLN intravenously. COMPARISON:  CT scan of May 04, 2018. FINDINGS: Lower chest: No acute abnormality. Hepatobiliary: Multiple low densities are noted in the right hepatic lobe which are enlarged compared to prior exam consistent with metastatic disease. The largest measures 3.0 x 2.3 cm in dome of right hepatic lobe. No gallstones are noted. Stable appearance of biliary stent extending from common hepatic duct into duodenum. Pancreas: Grossly stable ill-defined mass is noted measuring 6.8 x 3.5 cm which extends into retroperitoneal soft tissues. There remains atrophy of the pancreatic body and tail with associated pancreatic ductal dilatation. Spleen: Maximum measured diameter of 17.5 cm which is unchanged compared to prior exam. No focal abnormality is noted. Adrenals/Urinary Tract: Adrenal glands appear normal. Mild left hydronephrosis is noted without obstructing calculus. Mild urinary bladder distention is noted. No renal or ureteral calculi are noted. Stomach/Bowel: The stomach is unremarkable. Wall thickening of the duodenum and proximal jejunum is noted concerning for inflammation or edema. Stool is noted throughout the colon. No abnormal bowel dilatation is noted. The appendix is not visualized. Vascular/Lymphatic: Abdominal aorta is unremarkable. Cavernous transformation of  the portal vein is noted. 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 Reproductive: Uterus and bilateral adnexa are unremarkable. Other: No abdominal wall hernia or  abnormality. No abdominopelvic ascites. Musculoskeletal: No acute or significant osseous findings. IMPRESSION: Grossly stable ill-defined mass is seen arising from the pancreas measuring 6.8 x 3.5 cm, which extends into surrounding retroperitoneal soft tissues. Multiple low densities are noted in the right hepatic lobe which are increased in size compared to prior exam and consistent with worsening metastatic disease. Biliary stent is again noted. Continued wall thickening of duodenum and proximal jejunum is noted concerning for inflammation or edema. 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 Stable moderate splenomegaly. Mild left hydronephrosis is noted without obstructing calculus; potentially there may be ureteral occlusion secondary to retroperitoneal neoplasm. Electronically Signed   By: Marijo Conception, M.D.   On: 06/16/2018 21:32    Medications: I have reviewed the patient's current medications.  Assessment/Plan:  1. 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 Adenocarcinoma consistent with pancreatobiliary primary.  Cycle 1 FOLFIRINOX 06/18/2017  Cycle 2FOLFIRINOX 07/02/2017  Cycle 3 FOLFIRINOX 07/16/2017 (oxaliplatin dose reduced due to mild thrombocytopenia)  CT 07/24/2017-new splenomegaly and left hydronephrosis  Cycle 4 FOLFIRINOX 08/08/2017  Cycle 5 FOLFIRINOX 08/24/2017  CT abdomen/pelvis 09/03/2017-stable liver lesions and pancreas mass, stable right biliary dilatation, possible new 8 mm right liver lesion versus a vascular  phenomena, lesion at the right paracolic gutter was present in the past-significance unclear  Cycle 6 FOLFIRINOX1/29/2019  Cycle7 FOLFIRINOX2/19/2019 (Irinotecan andOxaliplatin dose reduced due to thrombocytopenia)  CT abdomen/pelvis 10/25/2017- findings concerning for enterocolitis. No bowel obstruction. Ill-defined pancreatic head mass. Multiple hepatic hypoenhancing lesions with the largest measuring 1.9 x 2.0 cm, previously 1.7 x 1.7 cm; 9 x 10 mm hypoenhancing lesion in the inferior right lobe of the liver slightly larger compared to the previous CT; 1.6 x 2.4 cm enhancing lesion in the right paracolic gutter increased in size compared to the prior CT.  Cycle 1 gemcitabine/Abraxane 11/04/2017  Cycle 2 gemcitabine/Abraxane 11/25/2017  Cycle 3 gemcitabine/Abraxane 12/08/2017  Cycle 4 gemcitabine 12/23/2017 (Abraxane held secondary to neuropathy)  CTs abdomen/pelvis 01/12/2018- decrease in size of the pancreatic mass and hepatic metastasis; slight interval decrease in size of right paracolic gutter implant; worsening abdominal/pelvic ascites. Bowel wall thickening likely due to ascites and low albumin. 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  Cycle 5 gemcitabine 01/13/2018 (Abraxane held secondary to neuropathy)  Cycle 6 gemcitabine/Abraxane 01/27/2018  CT abdomen/pelvis 02/01/2018- dominant metastases in the liver are unchanged, multiple new low-density lesions throughout the right lobe-small abscesses?, No change in pancreas mass  Cycle 7 gemcitabine/Abraxane 02/16/2018(gemcitabine and Abraxane dose reduced due to previous neutropenia)  Cycle 8 gemcitabine/Abraxane 03/03/2018  Cycle 9 gemcitabine/Abraxane 03/25/2018  Cycle 10 gemcitabine/Abraxane 04/14/2018  Restaging CTs 05/04/2018- similar ill-defined pancreatic mass; multiple liver metastases again noted, appears slightly improved; increase in volume of abdominal and pelvic ascites; diffuse small bowel  wall edema and mild edema of the ascending colon and transverse colon.  Cycle 11 gemcitabine/Abraxane 05/05/2018  Cycle 12 gemcitabine/Abraxane 06/02/2018   2. Pain secondary to #1.Improved.  3. Weight losssecondary to #1.  4.  Microcytic anemia  Transfused with packed red blood cells 07/25/2017, 16 2019, and 11/27/2017  5. History of thrombocytopenia secondary to chemotherapy  6. Port-A-Cath placement 06/04/2017  7. Elevated liver enzymes/bilirubin10/22/2018-likely early biliary obstruction secondary to pancreas cancer, status post ERCP-placement of a common bile duct stent 06/09/2018  8.Postprandial abdominal pain. Protonix initiated 07/16/2017.Trial of Reglan initiated 08/24/2017.  9.Admission with fever 07/24/2017-no source for infection identified, resolved   10.Diarrheafollowing cycle 3 FOLFIRINOX-resolved  11.Left hydronephrosis noted on CT12/03/2017-etiology unclear, urology consulted-stent not recommended.Mild left hydronephrosis noted on CT 10/25/2017. Creatinine normal 10/27/2017.  12. Admission 08/31/2017 with a high fever, blood culture positive for gram-negative rods-Klebsiella pneumoniae identified  13.Fever 10/25/2017 status post evaluation in the emergency department. CT with findings concerning for enterocolitis.   14. Admission 11/11/2017 with a high fever-blood cultures (peripheral and Port-A-Cath) positive for gram-negative rods--STENOTROPHOMONAS MALTOPHILIA;course of Septra completed as an outpatient.  15. Rash-potentially secondary to gemcitabine or infection, improved  16. Pancytopenia secondary to chemotherapy  17.Intermittent fever. Question "tumor fever", question infection. Now on Bactrim prophylaxis, improved  18. Ascites.Paracentesis 01/14/2018- reactive mesothelial cells, mixed inflammation;culture positive for Klebsiella and enterococcustreated withAugmentin  Admission with fever 01/30/2018,  blood cultures negative, discharged on ciprofloxacin/Flagyl  Recurrent ascites, paracentesis 02/23/2018-culture positive for strep viridans, placed on Levaquin  Paracentesis 03/03/2018- 2 L of fluid removed, culture and cytology negative  Trial of Aldactone/Lasix started 05/26/2018  19.Status post evaluation back pain in the emergency department 04/23/2018-MRI lumbar spine with L5-S1 left subarticular disc extrusion  L5-S1 laminotomy and microdiscectomy 05/17/2018, pain resolved  20.  Admission 06/16/2018 with fever and failure to thrive  Crystal Carlson has metastatic pancreas cancer.  She presents with failure to thrive and a fever.  She has a history of recurrent bacteremia and peritonitis.  She has been maintained on Levaquin prophylaxis.  I am concerned she has an acute infection.  She appears chronically ill.  There may be progression of the pancreas cancer.  The CT from 06/16/2018 suggest progressive disease in the liver.  I will review the CT and radiology.  It is possible some of the liver "lesions "are related to hepatic abscesses.  Her overall prognosis is poor.  I will discuss hospice care with her when her husband is here.  Ascites has responded to Aldactone/Lasix.  The ascites is likely in large part secondary to portal hypertension.  Recommendations: 1.  Continue broad-spectrum antibiotics, follow-up cultures 2.  Continue narcotic analgesics for pain 3.  I will see her 06/18/2018 to discuss goals of care related to the pancreas cancer   LOS: 1 day   Betsy Coder, MD   06/17/2018, 7:29 AM

## 2018-06-18 ENCOUNTER — Inpatient Hospital Stay (HOSPITAL_COMMUNITY): Payer: Medicaid Other

## 2018-06-18 DIAGNOSIS — E43 Unspecified severe protein-calorie malnutrition: Secondary | ICD-10-CM

## 2018-06-18 DIAGNOSIS — R109 Unspecified abdominal pain: Secondary | ICD-10-CM

## 2018-06-18 DIAGNOSIS — R6511 Systemic inflammatory response syndrome (SIRS) of non-infectious origin with acute organ dysfunction: Secondary | ICD-10-CM

## 2018-06-18 LAB — BASIC METABOLIC PANEL
ANION GAP: 6 (ref 5–15)
BUN: 30 mg/dL — ABNORMAL HIGH (ref 6–20)
CO2: 19 mmol/L — ABNORMAL LOW (ref 22–32)
Calcium: 8.7 mg/dL — ABNORMAL LOW (ref 8.9–10.3)
Chloride: 109 mmol/L (ref 98–111)
Creatinine, Ser: 0.91 mg/dL (ref 0.44–1.00)
GLUCOSE: 237 mg/dL — AB (ref 70–99)
Potassium: 4.4 mmol/L (ref 3.5–5.1)
SODIUM: 134 mmol/L — AB (ref 135–145)

## 2018-06-18 LAB — URINE CULTURE: Culture: NO GROWTH

## 2018-06-18 LAB — CBC WITH DIFFERENTIAL/PLATELET
Abs Immature Granulocytes: 1.15 10*3/uL — ABNORMAL HIGH (ref 0.00–0.07)
BASOS PCT: 0 %
Basophils Absolute: 0 10*3/uL (ref 0.0–0.1)
EOS ABS: 0 10*3/uL (ref 0.0–0.5)
Eosinophils Relative: 0 %
HEMATOCRIT: 28.2 % — AB (ref 36.0–46.0)
Hemoglobin: 8.4 g/dL — ABNORMAL LOW (ref 12.0–15.0)
IMMATURE GRANULOCYTES: 6 %
Lymphocytes Relative: 3 %
Lymphs Abs: 0.6 10*3/uL — ABNORMAL LOW (ref 0.7–4.0)
MCH: 23.9 pg — ABNORMAL LOW (ref 26.0–34.0)
MCHC: 29.8 g/dL — ABNORMAL LOW (ref 30.0–36.0)
MCV: 80.3 fL (ref 80.0–100.0)
MONO ABS: 1.1 10*3/uL — AB (ref 0.1–1.0)
MONOS PCT: 6 %
NEUTROS PCT: 85 %
Neutro Abs: 16.2 10*3/uL — ABNORMAL HIGH (ref 1.7–7.7)
PLATELETS: 198 10*3/uL (ref 150–400)
RBC: 3.51 MIL/uL — ABNORMAL LOW (ref 3.87–5.11)
RDW: 21.2 % — AB (ref 11.5–15.5)
WBC: 19.1 10*3/uL — AB (ref 4.0–10.5)
nRBC: 0 % (ref 0.0–0.2)

## 2018-06-18 LAB — GLUCOSE, CAPILLARY: GLUCOSE-CAPILLARY: 121 mg/dL — AB (ref 70–99)

## 2018-06-18 MED ORDER — ADULT MULTIVITAMIN W/MINERALS CH
1.0000 | ORAL_TABLET | Freq: Every day | ORAL | Status: DC
  Administered 2018-06-18 – 2018-06-24 (×6): 1 via ORAL
  Filled 2018-06-18 (×6): qty 1

## 2018-06-18 NOTE — Progress Notes (Addendum)
[facility] PROGRESS NOTE    Progress Note  Crystal Carlson  ZOX:[id number] DOB: 07-22-1966 DOA: 06/16/2018 PCP: Mack Hook, MD     Brief Narrative:   Crystal Carlson is an 52 y.o. female past medical history of metastatic pancreatic cancer and chemotherapy, malignant ascites prediabetes comes into the hospital complaining of fever and chills upon admission she was found to have an elevated white count with acute kidney injury CT scan of the abdomen showed metastatic retroperitoneally biliary stent and probable thrombosis of the superior mesenteric and left-sided hydronephrosis likely from neoplasm.  Assessment/Plan:   SIRS with acute organ dysfunction ([place]) She was started empirically on IV vancomycin, cefepime and Flagyl she was fluid resuscitated. She has remained afebrile leukocytosis improving. Culture data has remained negative till date, procalcitonin was 0.4 Get a CBC with differential. She was having some abdominal discomfort at home she is tender in her abdomen diffusely on physical exam hold fondaparinux, will perform ultrasound guided paracentesis sent for cell count and differential and culture. I have tried to contact the husband several times through out the day and have been unsuccessful I would like to introduce the concept of hospice and palliative care to her and her husband before consulting hospice and palliative care  Acute kidney injury: With baseline creatinine of 0.7, on admission 1.4. Likely prerenal azotemia, she is started on IV fluids her creatinine is morning is pending but it has been improving slowly.    Metastatic pancreatic cancer with malignant ascites: Currently on chemotherapy  Severe protein caloric malnutrition, Continue Ensure 3 times daily.  Iron deficiency: Continue iron supplement.  Mild Hydronephrosis, left: Will need follow-up with oncology as an outpatient this seems to be secondary to her retroperitoneal mass. We are going to  talk to the family about palliative care.    DVT prophylaxis: holding fondoparinux Family Communication:mother Disposition Plan/Barrier to D/C: unable to detemime Code Status:     Code Status Orders  (From admission, onward)         Start     Ordered   06/16/18 2226  Full code  Continuous     06/16/18 2225        Code Status History    Date Active Date Inactive Code Status Order ID Comments User Context   05/17/2018 1402 05/18/2018 2056 Full Code [id number]  Earnie Larsson, MD Inpatient   01/30/2018 1739 02/02/2018 1623 Full Code [id number]  Georgette Shell, MD ED   11/11/2017 1909 11/14/2017 1634 Full Code [id number]  Toy Baker, MD Inpatient   08/31/2017 2025 09/04/2017 1639 Full Code [id number]  Norval Morton, MD Inpatient   07/24/2017 1743 07/27/2017 1722 Full Code [id number]  Kinnie Feil, MD Inpatient   05/25/2017 0650 05/27/2017 2034 Full Code [id number]  Radene Gunning, NP ED        IV Access:    Peripheral IV   Procedures and diagnostic studies:   Dg Chest 2 View  Result Date: 06/16/2018 CLINICAL DATA:  Feeling unwell for 3 days.  On chemotherapy. EXAM: CHEST - 2 VIEW COMPARISON:  11/11/2017 FINDINGS: Porta catheter on the right with tip at the upper cavoatrial junction. There is no edema, consolidation, effusion, or pneumothorax. Normal heart size and mediastinal contours. IMPRESSION: No evidence of active disease in the chest. Electronically Signed   By: Monte Fantasia M.D.   On: 06/16/2018 18:13   Ct Abdomen Pelvis W Contrast  Result Date: 06/16/2018 CLINICAL DATA:  History of pancreatic cancer.  Weakness. EXAM: CT ABDOMEN  AND PELVIS WITH CONTRAST TECHNIQUE: Multidetector CT imaging of the abdomen and pelvis was performed using the standard protocol following bolus administration of intravenous contrast. CONTRAST:  61mL OMNIPAQUE IOHEXOL 300 MG/ML SOLN orally, 22mL OMNIPAQUE IOHEXOL 300 MG/ML SOLN intravenously. COMPARISON:  CT scan of May 04, 2018. FINDINGS: Lower chest: No acute abnormality. Hepatobiliary: Multiple low densities are noted in the right hepatic lobe which are enlarged compared to prior exam consistent with metastatic disease. The largest measures 3.0 x 2.3 cm in dome of right hepatic lobe. No gallstones are noted. Stable appearance of biliary stent extending from common hepatic duct into duodenum. Pancreas: Grossly stable ill-defined mass is noted measuring 6.8 x 3.5 cm which extends into retroperitoneal soft tissues. There remains atrophy of the pancreatic body and tail with associated pancreatic ductal dilatation. Spleen: Maximum measured diameter of 17.5 cm which is unchanged compared to prior exam. No focal abnormality is noted. Adrenals/Urinary Tract: Adrenal glands appear normal. Mild left hydronephrosis is noted without obstructing calculus. Mild urinary bladder distention is noted. No renal or ureteral calculi are noted. Stomach/Bowel: The stomach is unremarkable. Wall thickening of the duodenum and proximal jejunum is noted concerning for inflammation or edema. Stool is noted throughout the colon. No abnormal bowel dilatation is noted. The appendix is not visualized. Vascular/Lymphatic: Abdominal aorta is unremarkable. 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 Reproductive: Uterus and bilateral adnexa are unremarkable. Other: No abdominal wall hernia or abnormality. No abdominopelvic ascites. Musculoskeletal: No acute or significant osseous findings. IMPRESSION: Grossly stable ill-defined mass is seen arising from the pancreas measuring 6.8 x 3.5 cm, which extends into surrounding retroperitoneal soft tissues. Multiple low densities are noted in the right hepatic lobe which are increased in size compared to prior exam and consistent with worsening metastatic disease. Biliary stent is again noted. Continued wall thickening of duodenum and proximal jejunum is  noted concerning for inflammation or edema. 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 Stable moderate splenomegaly. Mild left hydronephrosis is noted without obstructing calculus; potentially there may be ureteral occlusion secondary to retroperitoneal neoplasm. Electronically Signed   By: Marijo Conception, M.D.   On: 06/16/2018 21:32     Medical Consultants:    None.  Anti-Infectives:   IV vancomycin, Flagyl and cefepime.  Subjective:    Crystal Carlson She feels better her abdominal pain is improved.  Objective:    Vitals:   06/17/18 1123 06/17/18 1200 06/17/18 2102 06/18/18 0438  BP: 96/76 107/81 111/90 115/78  Pulse:  83 82 81  Resp:  18 18 18   Temp:  97.8 F (36.6 C) 98.1 F (36.7 C) 97.6 F (36.4 C)  TempSrc:  Oral Oral Oral  SpO2:  100% 100% 100%  Weight:        Intake/Output Summary (Last 24 hours) at 06/18/2018 1019 Last data filed at 06/18/2018 0800 Gross per 24 hour  Intake 1720.37 ml  Output -  Net 1720.37 ml   Filed Weights   06/16/18 2238  Weight: 49.9 kg    Exam: General exam: In no acute distress. Respiratory system: Good air movement and clear to auscultation. Cardiovascular system: S1 & S2 heard, RRR.  Gastrointestinal system: Abdomen is nondistended, soft with diffuse tenderness Central nervous system: Alert and oriented. No focal neurological deficits. Extremities: No pedal edema. Skin: No rashes, lesions or ulcers Psychiatry: Judgement and insight appear normal. Mood & affect appropriate.    Data Reviewed:    Labs:  Basic Metabolic Panel: Recent Labs  Lab 06/16/18 1724 06/17/18 0437  NA 131* 135  K 4.2 4.2  CL 99 107  CO2 22 21*  GLUCOSE 129* 181*  BUN 31* 30*  CREATININE 1.43* 1.21*  CALCIUM 8.7* 8.0*   GFR Estimated Creatinine Clearance: 43.8 mL/min (A) (by C-G formula based on SCr of 1.21 mg/dL (H)). Liver Function Tests: Recent Labs  Lab 06/16/18 1724  AST 42*  ALT 29  ALKPHOS 311*  BILITOT 1.5*    PROT 7.6  ALBUMIN 2.3*   Recent Labs  Lab 06/16/18 1724  LIPASE 23   No results for input(s): AMMONIA in the last 168 hours. Coagulation profile Recent Labs  Lab 06/16/18 1724  INR 1.34    CBC: Recent Labs  Lab 06/16/18 1706 06/17/18 0437  WBC 21.3* 17.7*  HGB 10.0* 8.4*  HCT 33.2* 28.0*  MCV 78.1* 78.7*  PLT 250 177   Cardiac Enzymes: No results for input(s): CKTOTAL, CKMB, CKMBINDEX, TROPONINI in the last 168 hours. BNP (last 3 results) No results for input(s): PROBNP in the last 8760 hours. CBG: Recent Labs  Lab 06/16/18 1732 06/17/18 0930 06/18/18 0750  GLUCAP 119* 147* 121*   D-Dimer: No results for input(s): DDIMER in the last 72 hours. Hgb A1c: No results for input(s): HGBA1C in the last 72 hours. Lipid Profile: No results for input(s): CHOL, HDL, LDLCALC, TRIG, CHOLHDL, LDLDIRECT in the last 72 hours. Thyroid function studies: No results for input(s): TSH, T4TOTAL, T3FREE, THYROIDAB in the last 72 hours.  Invalid input(s): FREET3 Anemia work up: No results for input(s): VITAMINB12, FOLATE, FERRITIN, TIBC, IRON, RETICCTPCT in the last 72 hours. Sepsis Labs: Recent Labs  Lab 06/16/18 1706 06/16/18 1835 06/16/18 2018 06/16/18 2223 06/16/18 2224 06/17/18 0437  PROCALCITON  --   --   --  0.48  --   --   WBC 21.3*  --   --   --   --  17.7*  LATICACIDVEN  --  1.33 0.86  --  1.8  --    Microbiology Recent Results (from the past 240 hour(s))  Blood culture (routine x 2)     Status: None (Preliminary result)   Collection Time: 06/16/18  5:24 PM  Result Value Ref Range Status   Specimen Description   Final    BLOOD RIGHT ANTECUBITAL Performed at [facility], [place] [address] [place], [place] [address]    Special Requests   Final    BOTTLES DRAWN AEROBIC AND ANAEROBIC Blood Culture adequate volume   Culture   Final    NO GROWTH < 24 HOURS Performed at [facility], [place] [address] [place], [place] [address]     Report Status PENDING  Incomplete  Blood culture (routine x 2)     Status: None (Preliminary result)   Collection Time: 06/16/18  5:24 PM  Result Value Ref Range Status   Specimen Description   Final    BLOOD PORTA CATH Performed at [place] [address], [place], [place] [address]    Special Requests   Final    BOTTLES DRAWN AEROBIC AND ANAEROBIC Blood Culture adequate volume   Culture   Final    NO GROWTH < 24 HOURS Performed at [facility], [place] [address], [place], [place] [address]    Report Status PENDING  Incomplete  Urine culture     Status: None   Collection Time: 06/16/18  5:24 PM  Result Value Ref  Range Status   Specimen Description   Final    URINE, RANDOM Performed at [facility], [place] [address], [place], [place] [address]    Special Requests   Final    NONE Performed at [facility], [place] [address], [place], [place] [address]    Culture   Final    NO GROWTH Performed at [facility], [place] [address] [place], [place] [address]    Report Status 06/18/2018 FINAL  Final     Medications:   . ferrous sulfate  325 mg Oral Q1400  . fondaparinux (ARIXTRA) injection  2.5 mg Subcutaneous Daily  . morphine  15 mg Oral Q12H  . polyethylene glycol  17 g Oral Daily  . predniSONE  10 mg Oral BID WC   Continuous Infusions: . sodium chloride 75 mL/hr at 06/18/18 0800  . ceFEPime (MAXIPIME) IV Stopped (06/17/18 2138)  . metronidazole 500 mg (06/18/18 0934)  . vancomycin Stopped (06/17/18 2025)      LOS: 2 days   Charlynne Cousins  [facility] Pager [phone]  *Please refer to [email or web address], password [email or web address] to get updated schedule on who will round on this patient, as hospitalists switch teams weekly. If 7PM-7AM, please contact night-coverage at [email or web address], password [email or web address] for any overnight needs.  06/18/2018, 10:19 AM

## 2018-06-18 NOTE — Progress Notes (Signed)
IP PROGRESS NOTE  Subjective:   Crystal Carlson reports feeling better.  Her family is at the bedside.  Objective: Vital signs in last 24 hours: Blood pressure 101/81, pulse 71, temperature 97.9 F (36.6 C), temperature source Oral, resp. rate 18, weight 110 lb (49.9 kg), SpO2 100 %.  Intake/Output from previous day: 10/31 0701 - 11/01 0700 In: 1502.9 [I.V.:902.8; IV Piggyback:600.1] Out: -   Physical Exam:  HEENT: No thrush Lungs: Clear anteriorly Cardiac: Regular rate and rhythm Abdomen: Nondistended, no hepatomegaly, nontender, no mass at the umbilicus Extremities: No leg edema   Portacath/PICC-without erythema  Lab Results: Recent Labs    06/17/18 0437 06/18/18 1042  WBC 17.7* 19.1*  HGB 8.4* 8.4*  HCT 28.0* 28.2*  PLT 177 198    BMET Recent Labs    06/17/18 0437 06/18/18 1042  NA 135 134*  K 4.2 4.4  CL 107 109  CO2 21* 19*  GLUCOSE 181* 237*  BUN 30* 30*  CREATININE 1.21* 0.91  CALCIUM 8.0* 8.7*    Lab Results  Component Value Date   CEA1 1.3 05/25/2017    Studies/Results: Dg Chest 2 View  Result Date: 06/16/2018 CLINICAL DATA:  Feeling unwell for 3 days.  On chemotherapy. EXAM: CHEST - 2 VIEW COMPARISON:  11/11/2017 FINDINGS: Porta catheter on the right with tip at the upper cavoatrial junction. There is no edema, consolidation, effusion, or pneumothorax. Normal heart size and mediastinal contours. IMPRESSION: No evidence of active disease in the chest. Electronically Signed   By: Monte Fantasia M.D.   On: 06/16/2018 18:13   Ct Abdomen Pelvis W Contrast  Result Date: 06/16/2018 CLINICAL DATA:  History of pancreatic cancer.  Weakness. EXAM: CT ABDOMEN AND PELVIS WITH CONTRAST TECHNIQUE: Multidetector CT imaging of the abdomen and pelvis was performed using the standard protocol following bolus administration of intravenous contrast. CONTRAST:  50mL OMNIPAQUE IOHEXOL 300 MG/ML SOLN orally, 8mL OMNIPAQUE IOHEXOL 300 MG/ML SOLN intravenously.  COMPARISON:  CT scan of May 04, 2018. FINDINGS: Lower chest: No acute abnormality. Hepatobiliary: Multiple low densities are noted in the right hepatic lobe which are enlarged compared to prior exam consistent with metastatic disease. The largest measures 3.0 x 2.3 cm in dome of right hepatic lobe. No gallstones are noted. Stable appearance of biliary stent extending from common hepatic duct into duodenum. Pancreas: Grossly stable ill-defined mass is noted measuring 6.8 x 3.5 cm which extends into retroperitoneal soft tissues. There remains atrophy of the pancreatic body and tail with associated pancreatic ductal dilatation. Spleen: Maximum measured diameter of 17.5 cm which is unchanged compared to prior exam. No focal abnormality is noted. Adrenals/Urinary Tract: Adrenal glands appear normal. Mild left hydronephrosis is noted without obstructing calculus. Mild urinary bladder distention is noted. No renal or ureteral calculi are noted. Stomach/Bowel: The stomach is unremarkable. Wall thickening of the duodenum and proximal jejunum is noted concerning for inflammation or edema. Stool is noted throughout the colon. No abnormal bowel dilatation is noted. The appendix is not visualized. Vascular/Lymphatic: Abdominal aorta is unremarkable. 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 Reproductive: Uterus and bilateral adnexa are unremarkable. Other: No abdominal wall hernia or abnormality. No abdominopelvic ascites. Musculoskeletal: No acute or significant osseous findings. IMPRESSION: Grossly stable ill-defined mass is seen arising from the pancreas measuring 6.8 x 3.5 cm, which extends into surrounding retroperitoneal soft tissues. Multiple low densities are noted in the right hepatic lobe which are increased in size compared to  prior exam and consistent with worsening metastatic disease. Biliary stent is again noted. Continued wall thickening  of duodenum and proximal jejunum is noted concerning for inflammation or edema. 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 Stable moderate splenomegaly. Mild left hydronephrosis is noted without obstructing calculus; potentially there may be ureteral occlusion secondary to retroperitoneal neoplasm. Electronically Signed   By: Marijo Conception, M.D.   On: 06/16/2018 21:32    Medications: I have reviewed the patient's current medications.  Assessment/Plan:  1. 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 Adenocarcinoma consistent with pancreatobiliary primary.  Cycle 1 FOLFIRINOX 06/18/2017  Cycle 2FOLFIRINOX 07/02/2017  Cycle 3 FOLFIRINOX 07/16/2017 (oxaliplatin dose reduced due to mild thrombocytopenia)  CT 07/24/2017-new splenomegaly and left hydronephrosis  Cycle 4 FOLFIRINOX 08/08/2017  Cycle 5 FOLFIRINOX 08/24/2017  CT abdomen/pelvis 09/03/2017-stable liver lesions and pancreas mass, stable right biliary dilatation, possible new 8 mm right liver lesion versus a vascular phenomena, lesion at the right paracolic gutter was present in the past-significance unclear  Cycle 6 FOLFIRINOX1/29/2019  Cycle7 FOLFIRINOX2/19/2019 (Irinotecan andOxaliplatin dose reduced due to thrombocytopenia)  CT abdomen/pelvis 10/25/2017- findings concerning for enterocolitis. No bowel obstruction. Ill-defined pancreatic head mass. Multiple hepatic hypoenhancing lesions with the largest measuring 1.9 x 2.0 cm, previously 1.7 x 1.7 cm; 9 x 10 mm hypoenhancing  lesion in the inferior right lobe of the liver slightly larger compared to the previous CT; 1.6 x 2.4 cm enhancing lesion in the right paracolic gutter increased in size compared to the prior CT.  Cycle 1 gemcitabine/Abraxane 11/04/2017  Cycle 2 gemcitabine/Abraxane 11/25/2017  Cycle 3 gemcitabine/Abraxane 12/08/2017  Cycle 4 gemcitabine 12/23/2017 (Abraxane held secondary to neuropathy)  CTs abdomen/pelvis 01/12/2018- decrease in size of the pancreatic mass and hepatic metastasis; slight interval decrease in size of right paracolic gutter implant; worsening abdominal/pelvic ascites. Bowel wall thickening likely due to ascites and low albumin. 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  Cycle 5 gemcitabine 01/13/2018 (Abraxane held secondary to neuropathy)  Cycle 6 gemcitabine/Abraxane 01/27/2018  CT abdomen/pelvis 02/01/2018- dominant metastases in the liver are unchanged, multiple new low-density lesions throughout the right lobe-small abscesses?, No change in pancreas mass  Cycle 7 gemcitabine/Abraxane 02/16/2018(gemcitabine and Abraxane dose reduced due to previous neutropenia)  Cycle 8 gemcitabine/Abraxane 03/03/2018  Cycle 9 gemcitabine/Abraxane 03/25/2018  Cycle 10 gemcitabine/Abraxane 04/14/2018  Restaging CTs 05/04/2018- similar ill-defined pancreatic mass; multiple liver metastases again noted, appears slightly improved; increase in volume of abdominal and pelvic ascites; diffuse small bowel wall edema and mild edema of the ascending colon and transverse colon.  Cycle 11 gemcitabine/Abraxane 05/05/2018  Cycle 12 gemcitabine/Abraxane 06/02/2018   2. Pain secondary to #1.Improved.  3. Weight losssecondary to #1.  4. Microcytic anemia  Transfused with packed red blood cells 07/25/2017, 16 2019, and 11/27/2017  5. History of thrombocytopenia secondary to chemotherapy  6. Port-A-Cath placement 06/04/2017  7. Elevated liver  enzymes/bilirubin10/22/2018-likely early biliary obstruction secondary to pancreas cancer, status post ERCP-placement of a common bile duct stent 06/09/2018  8.Postprandial abdominal pain. Protonix initiated 07/16/2017.Trial of Reglan initiated 08/24/2017.  9.Admission with fever 07/24/2017-no source for infection identified, resolved   10.Diarrheafollowing cycle 3 FOLFIRINOX-resolved  11.Left hydronephrosis noted on CT12/03/2017-etiology unclear, urology consulted-stent not recommended.Mild left hydronephrosis noted on CT 10/25/2017. Creatinine normal 10/27/2017.  12. Admission 08/31/2017 with a high fever, blood culture positive for gram-negative rods-Klebsiella pneumoniae identified  13.Fever 10/25/2017 status post evaluation in the emergency department. CT with findings concerning for enterocolitis.   14. Admission 11/11/2017 with a high fever-blood cultures (peripheral and Port-A-Cath) positive for gram-negative rods--STENOTROPHOMONAS MALTOPHILIA;course of Septra completed as an outpatient.  15. Rash-potentially secondary to gemcitabine or infection, improved  16. Pancytopenia secondary to chemotherapy  17.Intermittent fever. Question "tumor fever", question infection. Now on Bactrim prophylaxis, improved  18. Ascites.Paracentesis 01/14/2018- reactive mesothelial cells, mixed inflammation;culture positive for Klebsiella and enterococcustreated withAugmentin  Admission with fever 01/30/2018, blood cultures negative, discharged on ciprofloxacin/Flagyl  Recurrent ascites, paracentesis 02/23/2018-culture positive for strep viridans, placed on Levaquin  Paracentesis 03/03/2018- 2 L of fluid removed, culture and cytology negative  Trial of Aldactone/Lasix started 05/26/2018  19.Status post evaluation back pain in the emergency department 04/23/2018-MRI lumbar spine with L5-S1 left subarticular disc extrusion  L5-S1 laminotomy and microdiscectomy  05/17/2018, pain resolved  20.  Admission 06/16/2018 with fever and failure to thrive  Crystal Carlson appears much improved from hospital admission.  I suspect this is secondary to hydration and antibiotic therapy. Cultures are negative to date.  I reviewed the 06/16/2018 CT and radiology.  There are multiple hypodense lesions in the liver.  A few of the lesions have been present and appear as metastases.  Many of the lesions contain air and have an appearance more consistent with abscesses.  I discussed the CT findings with Crystal Carlson and her husband.  I recommend a diagnostic aspirate of a liver lesion for cytology, Gram stain, and culture.    Recommendations: 1.  Continue broad-spectrum antibiotics, follow-up cultures 2.  Continue narcotic analgesics for pain 3.  Consult interventional radiology for aspiration of a liver lesion 4.  Please call oncology as needed over the weekend.  I will see her 06/21/2018 if she remains in the hospital.  Outpatient follow-up will be scheduled at the Cancer center.   LOS: 2 days   Betsy Coder, MD   06/18/2018, 3:17 PM

## 2018-06-18 NOTE — Progress Notes (Addendum)
Initial Nutrition Assessment  DOCUMENTATION CODES:   Underweight, Severe malnutrition in context of chronic illness  INTERVENTION:    Monitor for diet advancement/toleration  Boost Plus chocolate TID- Each supplement provides 360kcal and 14g protein.    Provide MVI daily  NUTRITION DIAGNOSIS:   Severe Malnutrition related to chronic illness, cancer and cancer related treatments as evidenced by energy intake < or equal to 75% for > or equal to 1 month, severe fat depletion, moderate muscle depletion, severe muscle depletion.  GOAL:   Patient will meet greater than or equal to 90% of their needs  MONITOR:   PO intake, Supplement acceptance, Diet advancement, Labs, Weight trends, I & O's  REASON FOR ASSESSMENT:   Consult Assessment of nutrition requirement/status  ASSESSMENT:   Patient with PMH significant for pancreatic cancer metastasized to liver (currently on chemotherapy), ascites, anxiety, and iron deficiency anemia. Presents this admission with generalized weakness, fever, and chills. Admitted for CT scan of the abdomen showing metastatic retroperitoneally biliary stent and left-sided hydronephrosis likely from neoplasm.   Pt endorses having a loss in appetite for over a year after her diagnosis. States she was recently prescribed Megace three months ago and this has helped a little. Prior to having an appetite stimulant pt would eat bites throughout the day and now she can tolerate at least one Boost and 3-4 smaller meals (rice, chicken, tortillas). She endorses taste changes that make food taste bitter. Pt is followed by [place] [facility] (last visit on 03/03/2018). They discussed increasing Boost High Protein intake to twice daily (she can do this, but is inconsistent). Discussed the importance of protein intake for preservation of lean body mass moving forward with chemotherapy treatments. Encourage pt to eat small more frequent meals throughout the day. Pt is currently NPO.  Will provide supplementation upon diet advancement.   Pt reports a UBW of 145 lb prior to her diagnosis one year ago. Weight looks to fluctuate likely related to recurrent ascites making it hard to determine actual dry wt loss. Suspect pt has recently lost dry weight. Nutrition-Focused physical exam completed.   Medications reviewed and include: ferrous sulfate, prednisone, NS @ 75 ml/hr Labs reviewed.   NUTRITION - FOCUSED PHYSICAL EXAM:    Most Recent Value  Orbital Region  Moderate depletion  Upper Arm Region  Severe depletion  Thoracic and Lumbar Region  Unable to assess  Buccal Region  Severe depletion  Temple Region  Moderate depletion  Clavicle Bone Region  Severe depletion  Clavicle and Acromion Bone Region  Severe depletion  Scapular Bone Region  Unable to assess  Dorsal Hand  Moderate depletion  Patellar Region  Severe depletion  Anterior Thigh Region  Severe depletion  Posterior Calf Region  Severe depletion  Edema (RD Assessment)  Unable to assess     Diet Order:   Diet Order            Diet NPO time specified  Diet effective now              EDUCATION NEEDS:   Education needs have been addressed  Skin:  Skin Assessment: Reviewed RN Assessment  Last BM:  06/17/18  Height:   Ht Readings from Last 1 Encounters:  06/02/18 5\' 5"  (1.651 m)    Weight:   Wt Readings from Last 1 Encounters:  06/16/18 49.9 kg    Ideal Body Weight:  56.8 kg  BMI:  Body mass index is 18.3 kg/m.  Estimated Nutritional Needs:   Kcal:  1500-1700 kcal  Protein:  75-90 grams  Fluid:  >/= 1.5 L/day  Crystal Carlson RD, LDN Clinical Nutrition Pager # - [phone]

## 2018-06-18 NOTE — Progress Notes (Signed)
Patient ID: Crystal Carlson, female   DOB: 09/30/1965, 52 y.o.   MRN: [id number] Patient presented to ultrasound department today for paracentesis.  Limited ultrasound of abdomen in all 4 quadrants reveals only small amount of ascites not safely amenable to tap at this time secondary to close proximity to bowel loops.  Liver lesion aspiration has also been requested on patient by oncology.  Plan to review imaging and schedule accordingly if procedure can be done. Pt aware.

## 2018-06-19 ENCOUNTER — Inpatient Hospital Stay (HOSPITAL_COMMUNITY): Payer: Medicaid Other

## 2018-06-19 LAB — BASIC METABOLIC PANEL
ANION GAP: 4 — AB (ref 5–15)
BUN: 24 mg/dL — AB (ref 6–20)
CALCIUM: 8.3 mg/dL — AB (ref 8.9–10.3)
CO2: 21 mmol/L — ABNORMAL LOW (ref 22–32)
Chloride: 112 mmol/L — ABNORMAL HIGH (ref 98–111)
Creatinine, Ser: 0.76 mg/dL (ref 0.44–1.00)
GFR calc Af Amer: >60 mL/min (ref >60)
GLUCOSE: 147 mg/dL — AB (ref 70–99)
POTASSIUM: 4.8 mmol/L (ref 3.5–5.1)
Sodium: 137 mmol/L (ref 135–145)

## 2018-06-19 MED ORDER — VANCOMYCIN HCL IN DEXTROSE 1-5 GM/200ML-% IV SOLN
1000.0000 mg | INTRAVENOUS | Status: DC
  Administered 2018-06-20: 1000 mg via INTRAVENOUS
  Filled 2018-06-19: qty 200

## 2018-06-19 MED ORDER — SODIUM CHLORIDE 0.9 % IV SOLN
1.0000 g | Freq: Three times a day (TID) | INTRAVENOUS | Status: DC
  Administered 2018-06-19 – 2018-06-20 (×3): 1 g via INTRAVENOUS
  Filled 2018-06-19 (×4): qty 1

## 2018-06-19 MED ORDER — GADOBUTROL 1 MMOL/ML IV SOLN
7.5000 mL | Freq: Once | INTRAVENOUS | Status: AC | PRN
  Administered 2018-06-19: 6 mL via INTRAVENOUS

## 2018-06-19 NOTE — Progress Notes (Signed)
Pharmacy Antibiotic Note  Crystal Carlson is a 52 y.o. female admitted on 06/16/2018 with sepsis.  Pharmacy has been consulted for cefepime and vancomycin dosing.  Today, 06/19/18  No new labs - WBC 19.1 on 11/1 slightly decreased from admission  SCr 0.8 - decreased, WNL. CrCl ~68 mL/min  Afebrile  Day #3 of broad spectrum IV antibiotics (vancomycin, cefepime, and metronidazole)  Plan:  Given improvement in renal function will increase antibiotic doses:  Cefepime 1 g IV q8h  Vancomycin 1000 mg IV q24h  Goal AUC 400-500   Continue to monitor cultures, clinical course, and renal function  Monitor vancomycin levels once at steady state as indicated   Weight: 113 lb 1.5 oz (51.3 kg)  Temp (24hrs), Avg:97.6 F (36.4 C), Min:97.4 F (36.3 C), Max:97.9 F (36.6 C)  Recent Labs  Lab 06/16/18 1706 06/16/18 1724 06/16/18 1835 06/16/18 2018 06/16/18 2224 06/17/18 0437 06/18/18 1042 06/19/18 0531  WBC 21.3*  --   --   --   --  17.7* 19.1*  --   CREATININE  --  1.43*  --   --   --  1.21* 0.91 0.76  LATICACIDVEN  --   --  1.33 0.86 1.8  --   --   --     Estimated Creatinine Clearance: 68.1 mL/min (by C-G formula based on SCr of 0.76 mg/dL).    Allergies  Allergen Reactions  . Pork-Derived Products     RELIGIOUS BELIEF -  NO PORK    Antimicrobials this admission: 10/30 cefepime >>  10/30 vancomycin >>  10/30 metronidazole >>  Dose adjustments this admission: 11/2 cefepime 1 g IV q12h --> 1 g IV q8h 11/2 vancomycin 1000 mg IV q36h --> 1000 mg IV q24h  Microbiology results: 11/2 BCx: ngtd 10/30 UCx:  NGF  Thank you for allowing pharmacy to be a part of this patient's care.  Lenis Noon, PharmD, BCPS Clinical Pharmacist 06/19/2018 9:19 AM

## 2018-06-19 NOTE — Progress Notes (Signed)
Patient ID: Crystal Carlson, female   DOB: [birth year], 52 y.o.   MRN: [id number] Patient's latest imaging studies have been reviewed by Dr. Earleen Newport.  Prior to consideration of liver aspiration he recommends obtaining MRI abdomen with IV contrast for further evaluation. Please page Dr. Earleen Newport at [phone] with any additional questions.

## 2018-06-19 NOTE — Progress Notes (Signed)
PROGRESS NOTE    Crystal Carlson  IZT:[id number] DOB: Jul 21, 1966 DOA: 06/16/2018 PCP: Mack Hook, MD    Brief Narrative:  52 y.o. female past medical history of metastatic pancreatic cancer and chemotherapy, malignant ascites prediabetes comes into the hospital complaining of fever and chills upon admission she was found to have an elevated white count with acute kidney injury CT scan of the abdomen showed metastatic retroperitoneally biliary stent and probable thrombosis of the superior mesenteric and left-sided hydronephrosis likely from neoplasm.  Assessment & Plan:   Principal Problem:   Sepsis with acute organ dysfunction (HCC) Active Problems:   Prediabetes   Pancreatic cancer metastasized to liver (HCC)   Protein-calorie malnutrition, moderate (HCC)   Ascites   Anxiety   AKI (acute kidney injury) ([place])   Duodenitis   Hydronephrosis, left   Iron deficiency anemia   Protein-calorie malnutrition, severe  SIRS with acute organ dysfunction (HCC) -will continue empiric vanc and flagyl -Remains afebrile -MRI abd ordered, per below, pending results -Repeat cbc in AM -Blood cx thus far neg  Acute kidney injury: -With baseline creatinine of 0.7, on admission 1.4. -Cr has normalized with hydration    Metastatic pancreatic cancer with malignant ascites: -Currently on chemotherapy -Oncology following -Recommendations for liver biopsy. Discussed with IR who recommending first obtaining MRI abd. Ordered, results pending  Severe protein caloric malnutrition, -Continue Ensure 3 times daily as tolerated  Iron deficiency: -Continue iron supplement as tolerated  Mild Hydronephrosis, left: -Will need follow-up with oncology as an outpatient this seems to be secondary to her retroperitoneal mass.  DVT prophylaxis: SCD's Code Status: Full Family Communication: Pt in room, famlily at bedside Disposition Plan: Uncertain at this time  Consultants:    Oncology  IR  Procedures:     Antimicrobials: Anti-infectives (From admission, onward)   Start     Dose/Rate Route Frequency Ordered Stop   06/20/18 0600  vancomycin (VANCOCIN) IVPB 1000 mg/200 mL premix     1,000 mg 200 mL/hr over 60 Minutes Intravenous Every 24 hours 06/19/18 0919     06/19/18 1400  ceFEPIme (MAXIPIME) 1 g in sodium chloride 0.9 % 100 mL IVPB     1 g 200 mL/hr over 30 Minutes Intravenous Every 8 hours 06/19/18 0916     06/17/18 1800  vancomycin (VANCOCIN) IVPB 1000 mg/200 mL premix  Status:  Discontinued     1,000 mg 200 mL/hr over 60 Minutes Intravenous Every 36 hours 06/17/18 0022 06/19/18 0919   06/17/18 0800  ceFEPIme (MAXIPIME) 1 g in sodium chloride 0.9 % 100 mL IVPB  Status:  Discontinued     1 g 200 mL/hr over 30 Minutes Intravenous Every 12 hours 06/17/18 0022 06/19/18 0916   06/16/18 1815  ceFEPIme (MAXIPIME) 2 g in sodium chloride 0.9 % 100 mL IVPB     2 g 200 mL/hr over 30 Minutes Intravenous  Once 06/16/18 1804 06/16/18 1903   06/16/18 1815  metroNIDAZOLE (FLAGYL) IVPB 500 mg     500 mg 100 mL/hr over 60 Minutes Intravenous Every 8 hours 06/16/18 1804     06/16/18 1815  vancomycin (VANCOCIN) IVPB 1000 mg/200 mL premix     1,000 mg 200 mL/hr over 60 Minutes Intravenous  Once 06/16/18 1804 06/16/18 2334       Subjective: Without complaints at this time  Objective: Vitals:   06/18/18 1357 06/18/18 2016 06/19/18 0500 06/19/18 0506  BP: 101/81 92/75  93/71  Pulse: 71 (!) 59  (!) 53  Resp:  18  20  Temp: 97.9 F (36.6 C) 97.6 F (36.4 C)  (!) 97.4 F (36.3 C)  TempSrc: Oral Oral  Oral  SpO2: 100% 100%  100%  Weight:   51.3 kg     Intake/Output Summary (Last 24 hours) at 06/19/2018 1513 Last data filed at 06/19/2018 1404 Gross per 24 hour  Intake 2354.2 ml  Output 0 ml  Net 2354.2 ml   Filed Weights   06/16/18 2238 06/19/18 0500  Weight: 49.9 kg 51.3 kg    Examination:  General exam: Appears calm and comfortable   Respiratory system: Clear to auscultation. Respiratory effort normal, no wheezing Cardiovascular system: S1 & S2 heard, RRR Gastrointestinal system: Abdomen is nondistended, soft and nontender. No organomegaly or masses felt. Normal bowel sounds heard. Central nervous system: Alert and oriented. No focal neurological deficits. Extremities: Symmetric 5 x 5 power. Skin: No rashes, lesions  Psychiatry: Judgement and insight appear normal. Mood & affect appropriate.   Data Reviewed: I have personally reviewed following labs and imaging studies  CBC: Recent Labs  Lab 06/16/18 1706 06/17/18 0437 06/18/18 1042  WBC 21.3* 17.7* 19.1*  NEUTROABS  --   --  16.2*  HGB 10.0* 8.4* 8.4*  HCT 33.2* 28.0* 28.2*  MCV 78.1* 78.7* 80.3  PLT 250 177 161   Basic Metabolic Panel: Recent Labs  Lab 06/16/18 1724 06/17/18 0437 06/18/18 1042 06/19/18 0531  NA 131* 135 134* 137  K 4.2 4.2 4.4 4.8  CL 99 107 109 112*  CO2 22 21* 19* 21*  GLUCOSE 129* 181* 237* 147*  BUN 31* 30* 30* 24*  CREATININE 1.43* 1.21* 0.91 0.76  CALCIUM 8.7* 8.0* 8.7* 8.3*   GFR: Estimated Creatinine Clearance: 68.1 mL/min (by C-G formula based on SCr of 0.76 mg/dL). Liver Function Tests: Recent Labs  Lab 06/16/18 1724  AST 42*  ALT 29  ALKPHOS 311*  BILITOT 1.5*  PROT 7.6  ALBUMIN 2.3*   Recent Labs  Lab 06/16/18 1724  LIPASE 23   No results for input(s): AMMONIA in the last 168 hours. Coagulation Profile: Recent Labs  Lab 06/16/18 1724  INR 1.34   Cardiac Enzymes: No results for input(s): CKTOTAL, CKMB, CKMBINDEX, TROPONINI in the last 168 hours. BNP (last 3 results) No results for input(s): PROBNP in the last 8760 hours. HbA1C: No results for input(s): HGBA1C in the last 72 hours. CBG: Recent Labs  Lab 06/16/18 1732 06/17/18 0930 06/18/18 0750  GLUCAP 119* 147* 121*   Lipid Profile: No results for input(s): CHOL, HDL, LDLCALC, TRIG, CHOLHDL, LDLDIRECT in the last 72 hours. Thyroid  Function Tests: No results for input(s): TSH, T4TOTAL, FREET4, T3FREE, THYROIDAB in the last 72 hours. Anemia Panel: No results for input(s): VITAMINB12, FOLATE, FERRITIN, TIBC, IRON, RETICCTPCT in the last 72 hours. Sepsis Labs: Recent Labs  Lab 06/16/18 1835 06/16/18 2018 06/16/18 2223 06/16/18 2224  PROCALCITON  --   --  0.48  --   LATICACIDVEN 1.33 0.86  --  1.8    Recent Results (from the past 240 hour(s))  Blood culture (routine x 2)     Status: None (Preliminary result)   Collection Time: 06/16/18  5:24 PM  Result Value Ref Range Status   Specimen Description   Final    BLOOD RIGHT ANTECUBITAL Performed at [facility], [place] [address] [place], [place] [address]    Special Requests   Final    BOTTLES DRAWN AEROBIC AND ANAEROBIC Blood Culture adequate volume   Culture  Final    NO GROWTH 3 DAYS Performed at [facility], [place] [address] [place], [place] [address]    Report Status PENDING  Incomplete  Blood culture (routine x 2)     Status: None (Preliminary result)   Collection Time: 06/16/18  5:24 PM  Result Value Ref Range Status   Specimen Description   Final    BLOOD PORTA CATH Performed at [facility] [address] [place], [place] [address]    Special Requests   Final    BOTTLES DRAWN AEROBIC AND ANAEROBIC Blood Culture adequate volume   Culture   Final    NO GROWTH 3 DAYS Performed at [facility], [place] [address] [place], [place] [address]    Report Status PENDING  Incomplete  Urine culture     Status: None   Collection Time: 06/16/18  5:24 PM  Result Value Ref Range Status   Specimen Description   Final    URINE, RANDOM Performed at [facility] [address] [place], [place] [address]    Special Requests   Final    NONE Performed at [facility] [place] [address], [place], [place] [address]    Culture   Final    NO GROWTH Performed at [facility], [place] [address], [place], [place] [address]    Report Status 06/18/2018 FINAL  Final     Radiology Studies: US Abdomen Limited  Result Date: 06/18/2018 CLINICAL DATA:  Pancreatic carcinoma and ascites. Prior paracentesis procedures with the most recent on 05/26/2018. EXAM: LIMITED ABDOMEN ULTRASOUND FOR ASCITES TECHNIQUE: Limited ultrasound survey for ascites was performed in all four abdominal quadrants. COMPARISON:  CT on 06/16/2018 FINDINGS: There is only a small volume of fluid in the peritoneal cavity, primarily around the liver. There was not enough fluid to warrant paracentesis. IMPRESSION: Small volume of ascites.  Paracentesis was not performed today. Electronically Signed   By: Aletta Edouard M.D.   On: 06/18/2018 16:33    Scheduled Meds: . ferrous sulfate  325 mg Oral Q1400  . morphine  15 mg Oral Q12H  . multivitamin with minerals  1 tablet Oral Daily  . polyethylene glycol  17 g Oral Daily  . predniSONE  10 mg Oral BID WC   Continuous Infusions: . sodium chloride 75 mL/hr at 06/19/18 1228  . ceFEPime (MAXIPIME) IV 1 g (06/19/18 1404)  . metronidazole 500 mg (06/19/18 0904)  . [START ON 06/20/2018] vancomycin       LOS: 3 days   Marylu Lund, MD [facility] Pager On Amion  If 7PM-7AM, please contact night-coverage 06/19/2018, 3:13 PM

## 2018-06-20 LAB — BASIC METABOLIC PANEL
Anion gap: 5 (ref 5–15)
BUN: 21 mg/dL — ABNORMAL HIGH (ref 6–20)
CO2: 20 mmol/L — ABNORMAL LOW (ref 22–32)
Calcium: 8.3 mg/dL — ABNORMAL LOW (ref 8.9–10.3)
Chloride: 113 mmol/L — ABNORMAL HIGH (ref 98–111)
Creatinine, Ser: 0.75 mg/dL (ref 0.44–1.00)
GFR calc Af Amer: >60 mL/min (ref >60)
GFR calc non Af Amer: >60 mL/min (ref >60)
Glucose, Bld: 106 mg/dL — ABNORMAL HIGH (ref 70–99)
Potassium: 4.3 mmol/L (ref 3.5–5.1)
Sodium: 138 mmol/L (ref 135–145)

## 2018-06-20 LAB — CBC
HCT: 26.4 % — ABNORMAL LOW (ref 36.0–46.0)
Hemoglobin: 7.8 g/dL — ABNORMAL LOW (ref 12.0–15.0)
MCH: 23.8 pg — ABNORMAL LOW (ref 26.0–34.0)
MCHC: 29.5 g/dL — ABNORMAL LOW (ref 30.0–36.0)
MCV: 80.5 fL (ref 80.0–100.0)
Platelets: 158 10*3/uL (ref 150–400)
RBC: 3.28 MIL/uL — ABNORMAL LOW (ref 3.87–5.11)
RDW: 21.5 % — ABNORMAL HIGH (ref 11.5–15.5)
WBC: 10.3 10*3/uL (ref 4.0–10.5)
nRBC: 0.2 % (ref 0.0–0.2)

## 2018-06-20 LAB — GLUCOSE, CAPILLARY: Glucose-Capillary: 107 mg/dL — ABNORMAL HIGH (ref 70–99)

## 2018-06-20 NOTE — Progress Notes (Signed)
PROGRESS NOTE    Crystal Carlson  QIH:[id number] DOB: [birth year] DOA: 06/16/2018 PCP: Mack Hook, MD     Brief Narrative:  52 year old woman admitted from home on 10/30 with complaints of fever and chills.  She has a history significant for metastatic pancreatic cancer currently on chemotherapy and malignant ascites as well as prediabetes.  Upon hospital admission she was found to have an elevated WBC count and acute kidney injury.  Admission was requested for further evaluation and management.   Assessment & Plan:   Principal Problem:   Sepsis with acute organ dysfunction (HCC) Active Problems:   Prediabetes   Pancreatic cancer metastasized to liver (HCC)   Protein-calorie malnutrition, moderate (HCC)   Ascites   Anxiety   AKI (acute kidney injury) ([place])   Duodenitis   Hydronephrosis, left   Iron deficiency anemia   Protein-calorie malnutrition, severe   SIRS -She has received 5 days of empiric vancomycin, cefepime and Flagyl. -She remains afebrile, cultures remain negative at day 3. -At this point will discontinue antibiotic therapy and monitor. -MRI of the abdomen with results as below.  Acute renal failure -Creatinine on admission was 1.4, creatinine has normalized with hydration. -Suspect etiology to be prerenal azotemia.  Normocytic anemia -Suspect anemia of chronic disease due to metastatic pancreatic cancer -Continue to monitor, hold transfusion unless hemoglobin were to drop below 7 or there were signs of acute bleeding.  Metastatic pancreatic cancer -With multiple liver lesions on CT, oncology is recommending liver aspiration. -Patient was evaluated by interventional radiology, decision was made to obtain MRI prior to biopsy. -MRI of the abdomen performed on 11/2: With multiple round cystic lesions throughout the left and right hepatic lobe advanced as compared to CT from September.  The radiologist is favoring pancreatic metastases in light of clinical  condition. -Will await IR opinion on feasibility of biopsy of these lesions. -I believe it is reasonable to introduce the concept of hospice and palliative care to this patient and family. -Will await oncology opinion.  Severe protein caloric malnutrition -In the face of metastatic pancreatic cancer. -Continue Ensure, appreciate dietitian input and recommendations.  Mild left hydronephrosis -Suspect secondary to retroperitoneal mass, she does not have a UTI and no signs of urinary retention, as such do not believe inpatient urology consultation is necessary.   DVT prophylaxis: SCDs Code Status: Full code Family Communication: Mother at bedside updated on plan of care and all questions answered, question if she fully understands given her limited [place]. Disposition Plan: Pending IR and oncology final recommendations  Consultants:   Oncology  Interventional radiology  Procedures:   None  Antimicrobials:  Anti-infectives (From admission, onward)   Start     Dose/Rate Route Frequency Ordered Stop   06/20/18 0600  vancomycin (VANCOCIN) IVPB 1000 mg/200 mL premix     1,000 mg 200 mL/hr over 60 Minutes Intravenous Every 24 hours 06/19/18 0919     06/19/18 1400  ceFEPIme (MAXIPIME) 1 g in sodium chloride 0.9 % 100 mL IVPB     1 g 200 mL/hr over 30 Minutes Intravenous Every 8 hours 06/19/18 0916     06/17/18 1800  vancomycin (VANCOCIN) IVPB 1000 mg/200 mL premix  Status:  Discontinued     1,000 mg 200 mL/hr over 60 Minutes Intravenous Every 36 hours 06/17/18 0022 06/19/18 0919   06/17/18 0800  ceFEPIme (MAXIPIME) 1 g in sodium chloride 0.9 % 100 mL IVPB  Status:  Discontinued     1 g 200 mL/hr over 30 Minutes  Intravenous Every 12 hours 06/17/18 0022 06/19/18 0916   06/16/18 1815  ceFEPIme (MAXIPIME) 2 g in sodium chloride 0.9 % 100 mL IVPB     2 g 200 mL/hr over 30 Minutes Intravenous  Once 06/16/18 1804 06/16/18 1903   06/16/18 1815  metroNIDAZOLE (FLAGYL) IVPB 500 mg     500  mg 100 mL/hr over 60 Minutes Intravenous Every 8 hours 06/16/18 1804     06/16/18 1815  vancomycin (VANCOCIN) IVPB 1000 mg/200 mL premix     1,000 mg 200 mL/hr over 60 Minutes Intravenous  Once 06/16/18 1804 06/16/18 2334       Subjective: Lying in bed, no complaints, states abdominal pain is minimal.  Objective: Vitals:   06/19/18 0506 06/19/18 1630 06/19/18 2048 06/20/18 0539  BP: 93/71 108/72 98/73 109/79  Pulse: (!) 53 68 (!) 59 (!) 55  Resp: 20 18 18 19   Temp: (!) 97.4 F (36.3 C) 98.1 F (36.7 C) 98 F (36.7 C) 97.8 F (36.6 C)  TempSrc: Oral Oral Oral Oral  SpO2: 100% 99% 100% 99%  Weight:    57.2 kg    Intake/Output Summary (Last 24 hours) at 06/20/2018 1002 Last data filed at 06/20/2018 0300 Gross per 24 hour  Intake 1021.1 ml  Output 0 ml  Net 1021.1 ml   Filed Weights   06/16/18 2238 06/19/18 0500 06/20/18 0539  Weight: 49.9 kg 51.3 kg 57.2 kg    Examination:  General exam: Alert, awake, oriented x 3, thin appearing Respiratory system: Clear to auscultation. Respiratory effort normal. Cardiovascular system:RRR. No murmurs, rubs, gallops. Gastrointestinal system: Abdomen is distended and nontender to palpation. No organomegaly or masses felt. Normal bowel sounds heard. Central nervous system: Alert and oriented. No focal neurological deficits. Extremities: No C/C/E, +pedal pulses Skin: No rashes, lesions or ulcers Psychiatry: Judgement and insight appear normal. Mood & affect appropriate.     Data Reviewed: I have personally reviewed following labs and imaging studies  CBC: Recent Labs  Lab 06/16/18 1706 06/17/18 0437 06/18/18 1042 06/20/18 0547  WBC 21.3* 17.7* 19.1* 10.3  NEUTROABS  --   --  16.2*  --   HGB 10.0* 8.4* 8.4* 7.8*  HCT 33.2* 28.0* 28.2* 26.4*  MCV 78.1* 78.7* 80.3 80.5  PLT 250 177 198 127   Basic Metabolic Panel: Recent Labs  Lab 06/16/18 1724 06/17/18 0437 06/18/18 1042 06/19/18 0531 06/20/18 0547  NA 131* 135 134*  137 138  K 4.2 4.2 4.4 4.8 4.3  CL 99 107 109 112* 113*  CO2 22 21* 19* 21* 20*  GLUCOSE 129* 181* 237* 147* 106*  BUN 31* 30* 30* 24* 21*  CREATININE 1.43* 1.21* 0.91 0.76 0.75  CALCIUM 8.7* 8.0* 8.7* 8.3* 8.3*   GFR: Estimated Creatinine Clearance: 75.7 mL/min (by C-G formula based on SCr of 0.75 mg/dL). Liver Function Tests: Recent Labs  Lab 06/16/18 1724  AST 42*  ALT 29  ALKPHOS 311*  BILITOT 1.5*  PROT 7.6  ALBUMIN 2.3*   Recent Labs  Lab 06/16/18 1724  LIPASE 23   No results for input(s): AMMONIA in the last 168 hours. Coagulation Profile: Recent Labs  Lab 06/16/18 1724  INR 1.34   Cardiac Enzymes: No results for input(s): CKTOTAL, CKMB, CKMBINDEX, TROPONINI in the last 168 hours. BNP (last 3 results) No results for input(s): PROBNP in the last 8760 hours. HbA1C: No results for input(s): HGBA1C in the last 72 hours. CBG: Recent Labs  Lab 06/16/18 1732 06/17/18 0930 06/18/18 0750  06/20/18 0729  GLUCAP 119* 147* 121* 107*   Lipid Profile: No results for input(s): CHOL, HDL, LDLCALC, TRIG, CHOLHDL, LDLDIRECT in the last 72 hours. Thyroid Function Tests: No results for input(s): TSH, T4TOTAL, FREET4, T3FREE, THYROIDAB in the last 72 hours. Anemia Panel: No results for input(s): VITAMINB12, FOLATE, FERRITIN, TIBC, IRON, RETICCTPCT in the last 72 hours. Urine analysis:    Component Value Date/Time   COLORURINE YELLOW 06/16/2018 [place] 06/16/2018 1706   LABSPEC 1.008 06/16/2018 1706   LABSPEC 1.020 07/24/2017 0847   PHURINE 6.0 06/16/2018 1706   GLUCOSEU NEGATIVE 06/16/2018 1706   GLUCOSEU Negative 07/24/2017 0847   HGBUR NEGATIVE 06/16/2018 1706   BILIRUBINUR NEGATIVE 06/16/2018 1706   BILIRUBINUR Negative 07/24/2017 0847   KETONESUR NEGATIVE 06/16/2018 1706   PROTEINUR NEGATIVE 06/16/2018 1706   UROBILINOGEN 0.2 07/24/2017 0847   NITRITE NEGATIVE 06/16/2018 1706   LEUKOCYTESUR NEGATIVE 06/16/2018 1706   LEUKOCYTESUR  Negative 07/24/2017 0847   Sepsis Labs: @LABRCNTIP (procalcitonin:4,lacticidven:4)  ) Recent Results (from the past 240 hour(s))  Blood culture (routine x 2)     Status: None (Preliminary result)   Collection Time: 06/16/18  5:24 PM  Result Value Ref Range Status   Specimen Description   Final    BLOOD RIGHT ANTECUBITAL Performed at [facility], [place] [address] [place], [place] [address]    Special Requests   Final    BOTTLES DRAWN AEROBIC AND ANAEROBIC Blood Culture adequate volume   Culture   Final    NO GROWTH 3 DAYS Performed at [facility], [place] [address], [place], [place] [address]    Report Status PENDING  Incomplete  Blood culture (routine x 2)     Status: None (Preliminary result)   Collection Time: 06/16/18  5:24 PM  Result Value Ref Range Status   Specimen Description   Final    BLOOD PORTA CATH Performed at [facility] [address] [place], [place] [address]    Special Requests   Final    BOTTLES DRAWN AEROBIC AND ANAEROBIC Blood Culture adequate volume   Culture   Final    NO GROWTH 3 DAYS Performed at [facility], [place] [address], [place], [place] [address]    Report Status PENDING  Incomplete  Urine culture     Status: None   Collection Time: 06/16/18  5:24 PM  Result Value Ref Range Status   Specimen Description   Final    URINE, RANDOM Performed at [facility] [address] [place], [place] [address]    Special Requests   Final    NONE Performed at [facility], [place] [address] [place], [place] [address]    Culture   Final    NO GROWTH Performed at [facility], [place] [address] [place],  [address]    Report Status 06/18/2018 FINAL  Final         Radiology Studies: Mr 3d Recon At Scanner  Result Date: 06/19/2018 CLINICAL DATA:  Pancreatic cancer. New cystic lesions in the liver. Fever and white count. Mildly elevated bilirubin  several days ago EXAM: MRI ABDOMEN WITHOUT AND WITH CONTRAST (INCLUDING MRCP) TECHNIQUE: Multiplanar multisequence MR imaging of the abdomen was performed both before and after the administration of intravenous contrast. Heavily T2-weighted images of the biliary and pancreatic ducts were obtained, and three-dimensional MRCP images were rendered by post processing. CONTRAST:  None administered COMPARISON:  7.5 mL  Hadriana.Din FINDINGS: Lower chest:  Lung bases are clear. Hepatobiliary: Marked progression round cystic lesions primarily in the central LEFT hepatic lobes but also within the posterolateral RIGHT hepatic lobe and relatively sparing the LEFT lateral hepatic lobe. These lesions are rounded and have high signal intensity on T2 weighted imaging consistent with cysts. There is subtle thin rim enhancement of these cysts noted on the subtracted imaging (image 11201). Lesions are immediately associated the biliary tree (image 77 of series 600) however no direct communication identified. Lesions do not restrict diffusion. There is minimal biliary duct dilatation in the LEFT hepatic lobe. Pancreas: Pancreatic mass surrounding the biliary stent not well depicted better evaluated by Pearson CT Spleen: Spleen is enlarged Adrenals/urinary tract: Adrenal glands poorly evaluated. Kidneys normal Stomach/Bowel: Stomach and limited of the small bowel is unremarkable Vascular/Lymphatic: Abdominal aortic normal caliber. No retroperitoneal periportal lymphadenopathy. Musculoskeletal: No aggressive osseous lesion IMPRESSION: 1. Multiple round cystic lesions throughout the LEFT RIGHT hepatic lobe advanced from CT 05/04/2018. Differential would include hepatic abscesses versus pancreatic metastasis versus biliary cystic process. Lesions appear to regular for abscesses. Favor pancreatic cystic metastatic disease versus cystic biliary process. FAVOR PANCREATIC METASTASIS in light of the elevated CA 19 9 and rapid advancement. 2.  Moderate ascites. 3. Biliary stent in place with mild duct dilatation LEFT hepatic lobe. 4. Pancreatic mass not well evaluated.  See  comparison CT Electronically Signed   By: Suzy Bouchard M.D.   On: 06/19/2018 16:20   US Abdomen Limited  Result Date: 06/18/2018 CLINICAL DATA:  Pancreatic carcinoma and ascites. Prior paracentesis procedures with the most recent on 05/26/2018. EXAM: LIMITED ABDOMEN ULTRASOUND FOR ASCITES TECHNIQUE: Limited ultrasound survey for ascites was performed in all four abdominal quadrants. COMPARISON:  CT on 06/16/2018 FINDINGS: There is only a small volume of fluid in the peritoneal cavity, primarily around the liver. There was not enough fluid to warrant paracentesis. IMPRESSION: Small volume of ascites.  Paracentesis was not performed today. Electronically Signed   By: Aletta Edouard M.D.   On: 06/18/2018 16:33   Mr Abdomen Mrcp Moise Boring Contast  Result Date: 06/19/2018 CLINICAL DATA:  Pancreatic cancer. New cystic lesions in the liver. Fever and white count. Mildly elevated bilirubin several days ago EXAM: MRI ABDOMEN WITHOUT AND WITH CONTRAST (INCLUDING MRCP) TECHNIQUE: Multiplanar multisequence MR imaging of the abdomen was performed both before and after the administration of intravenous contrast. Heavily T2-weighted images of the biliary and pancreatic ducts were obtained, and three-dimensional MRCP images were rendered by post processing. CONTRAST:  None administered COMPARISON:  7.5 mL Gadavist FINDINGS: Lower chest:  Lung bases are clear. Hepatobiliary: Marked progression round cystic lesions primarily in the central LEFT hepatic lobes but also within the posterolateral RIGHT hepatic lobe and relatively sparing the LEFT lateral hepatic lobe. These lesions are rounded and have high signal intensity on T2 weighted imaging consistent with cysts. There is subtle thin rim enhancement of these cysts noted on the subtracted imaging (image 11201). Lesions are immediately  associated the biliary tree (image 77 of series 600) however no direct communication identified. Lesions do not restrict diffusion. There is minimal biliary duct dilatation in the LEFT hepatic lobe. Pancreas: Pancreatic mass surrounding the biliary stent not well depicted better evaluated by Pearson CT Spleen: Spleen is enlarged Adrenals/urinary tract: Adrenal glands poorly evaluated. Kidneys normal Stomach/Bowel: Stomach and limited of the small bowel is unremarkable Vascular/Lymphatic: Abdominal aortic normal caliber. No retroperitoneal periportal lymphadenopathy. Musculoskeletal: No aggressive osseous lesion IMPRESSION: 1. Multiple round cystic  lesions throughout the LEFT RIGHT hepatic lobe advanced from CT 05/04/2018. Differential would include hepatic abscesses versus pancreatic metastasis versus biliary cystic process. Lesions appear to regular for abscesses. Favor pancreatic cystic metastatic disease versus cystic biliary process. FAVOR PANCREATIC METASTASIS in light of the elevated CA 19 9 and rapid advancement. 2. Moderate ascites. 3. Biliary stent in place with mild duct dilatation LEFT hepatic lobe. 4. Pancreatic mass not well evaluated.  See  comparison CT Electronically Signed   By: Suzy Bouchard M.D.   On: 06/19/2018 16:20        Scheduled Meds: . ferrous sulfate  325 mg Oral Q1400  . morphine  15 mg Oral Q12H  . multivitamin with minerals  1 tablet Oral Daily  . polyethylene glycol  17 g Oral Daily  . predniSONE  10 mg Oral BID WC   Continuous Infusions: . sodium chloride 75 mL/hr at 06/20/18 0528  . ceFEPime (MAXIPIME) IV 1 g (06/20/18 0525)  . metronidazole 500 mg (06/20/18 0249)  . vancomycin 1,000 mg (06/20/18 0609)     LOS: 4 days    Time spent: 25 minutes.     Lelon Frohlich, MD [facility] Pager [phone]  If 7PM-7AM, please contact night-coverage [email or web address] Password [email or web address] 06/20/2018, 10:02 AM

## 2018-06-21 DIAGNOSIS — R188 Other ascites: Secondary | ICD-10-CM

## 2018-06-21 DIAGNOSIS — C250 Malignant neoplasm of head of pancreas: Secondary | ICD-10-CM

## 2018-06-21 LAB — CBC
HEMATOCRIT: 29 % — AB (ref 36.0–46.0)
Hemoglobin: 8.3 g/dL — ABNORMAL LOW (ref 12.0–15.0)
MCH: 23.6 pg — AB (ref 26.0–34.0)
MCHC: 28.6 g/dL — ABNORMAL LOW (ref 30.0–36.0)
MCV: 82.4 fL (ref 80.0–100.0)
PLATELETS: 154 10*3/uL (ref 150–400)
RBC: 3.52 MIL/uL — AB (ref 3.87–5.11)
RDW: 22 % — ABNORMAL HIGH (ref 11.5–15.5)
WBC: 12.8 10*3/uL — ABNORMAL HIGH (ref 4.0–10.5)
nRBC: 0.3 % — ABNORMAL HIGH (ref 0.0–0.2)

## 2018-06-21 LAB — BASIC METABOLIC PANEL
ANION GAP: 7 (ref 5–15)
BUN: 20 mg/dL (ref 6–20)
CALCIUM: 8.3 mg/dL — AB (ref 8.9–10.3)
CHLORIDE: 112 mmol/L — AB (ref 98–111)
CO2: 19 mmol/L — AB (ref 22–32)
CREATININE: 0.7 mg/dL (ref 0.44–1.00)
GFR calc Af Amer: >60 mL/min (ref >60)
GFR calc non Af Amer: >60 mL/min (ref >60)
Glucose, Bld: 119 mg/dL — ABNORMAL HIGH (ref 70–99)
Potassium: 4.1 mmol/L (ref 3.5–5.1)
Sodium: 138 mmol/L (ref 135–145)

## 2018-06-21 LAB — CULTURE, BLOOD (ROUTINE X 2)
CULTURE: NO GROWTH
CULTURE: NO GROWTH
Special Requests: ADEQUATE
Special Requests: ADEQUATE

## 2018-06-21 LAB — GLUCOSE, CAPILLARY: Glucose-Capillary: 94 mg/dL (ref 70–99)

## 2018-06-21 LAB — PROTIME-INR
INR: 1.62
PROTHROMBIN TIME: 19 s — AB (ref 11.4–15.2)

## 2018-06-21 MED ORDER — SODIUM CHLORIDE 0.9% FLUSH
10.0000 mL | INTRAVENOUS | Status: DC | PRN
  Administered 2018-06-21: 10 mL
  Filled 2018-06-21: qty 40

## 2018-06-21 NOTE — Progress Notes (Signed)
PROGRESS NOTE    Crystal Carlson  WRU:[id number] DOB: 1966/07/03 DOA: 06/16/2018 PCP: Mack Hook, MD     Brief Narrative:  52 year old woman admitted from home on 10/30 with complaints of fever and chills.  She has a history significant for metastatic pancreatic cancer currently on chemotherapy and malignant ascites as well as prediabetes.  Upon hospital admission she was found to have an elevated WBC count and acute kidney injury.  Admission was requested for further evaluation and management.   Assessment & Plan:   Principal Problem:   Sepsis with acute organ dysfunction (HCC) Active Problems:   Prediabetes   Pancreatic cancer metastasized to liver ([place])   Protein-calorie malnutrition, moderate (HCC)   Ascites   Anxiety   AKI (acute kidney injury) ([place])   Duodenitis   Hydronephrosis, left   Iron deficiency anemia   Protein-calorie malnutrition, severe   Cancer of head of pancreas ([place])   SIRS -She has received 5 days of empiric vancomycin, cefepime and Flagyl. -She remains afebrile, cultures remain negative at day 4. -Antibiotics were discontinued as of 11/3.  Plan to follow clinically off antibiotic therapy for now. -MRI of the abdomen with results as below.  Acute renal failure -Creatinine on admission was 1.4, creatinine has normalized with hydration and is 0.70 today. -Suspect etiology to be prerenal azotemia.  Normocytic anemia -Suspect anemia of chronic disease due to metastatic pancreatic cancer -Continue to monitor, hold transfusion unless hemoglobin were to drop below 7 or there were signs of acute bleeding.  Metastatic pancreatic cancer -With multiple liver lesions on CT, oncology is recommending liver aspiration. -Patient was evaluated by interventional radiology, decision was made to obtain MRI prior to biopsy. -MRI of the abdomen performed on 11/2: With multiple round cystic lesions throughout the left and right hepatic lobe advanced as compared to CT  from September.  The radiologist is favoring pancreatic metastases in light of clinical condition.  Also need to rule out abscess. -Oncology has discussed with radiology today, recommendation is for repeat paracentesis prior to attempting liver aspiration.  This is scheduled for 11/5. -I believe it is reasonable to introduce the concept of hospice and palliative care to this patient and family. -Will await oncology opinion.  Severe protein caloric malnutrition -In the face of metastatic pancreatic cancer. -Continue Ensure, appreciate dietitian input and recommendations.  Mild left hydronephrosis -Suspect secondary to retroperitoneal mass, she does not have a UTI and no signs of urinary retention, as such do not believe inpatient urology consultation is necessary.   DVT prophylaxis: SCDs Code Status: Full code Family Communication: Mother at bedside updated on plan of care and all questions answered, question if she fully understands given her limited [place]. Disposition Plan: Pending IR and oncology final recommendations  Consultants:   Oncology  Interventional radiology  Procedures:   None  Antimicrobials:  Anti-infectives (From admission, onward)   Start     Dose/Rate Route Frequency Ordered Stop   06/20/18 0600  vancomycin (VANCOCIN) IVPB 1000 mg/200 mL premix  Status:  Discontinued     1,000 mg 200 mL/hr over 60 Minutes Intravenous Every 24 hours 06/19/18 0919 06/20/18 1009   06/19/18 1400  ceFEPIme (MAXIPIME) 1 g in sodium chloride 0.9 % 100 mL IVPB  Status:  Discontinued     1 g 200 mL/hr over 30 Minutes Intravenous Every 8 hours 06/19/18 0916 06/20/18 1009   06/17/18 1800  vancomycin (VANCOCIN) IVPB 1000 mg/200 mL premix  Status:  Discontinued     1,000 mg  200 mL/hr over 60 Minutes Intravenous Every 36 hours 06/17/18 0022 06/19/18 0919   06/17/18 0800  ceFEPIme (MAXIPIME) 1 g in sodium chloride 0.9 % 100 mL IVPB  Status:  Discontinued     1 g 200 mL/hr over 30  Minutes Intravenous Every 12 hours 06/17/18 0022 06/19/18 0916   06/16/18 1815  ceFEPIme (MAXIPIME) 2 g in sodium chloride 0.9 % 100 mL IVPB     2 g 200 mL/hr over 30 Minutes Intravenous  Once 06/16/18 1804 06/16/18 1903   06/16/18 1815  metroNIDAZOLE (FLAGYL) IVPB 500 mg  Status:  Discontinued     500 mg 100 mL/hr over 60 Minutes Intravenous Every 8 hours 06/16/18 1804 06/20/18 1009   06/16/18 1815  vancomycin (VANCOCIN) IVPB 1000 mg/200 mL premix     1,000 mg 200 mL/hr over 60 Minutes Intravenous  Once 06/16/18 1804 06/16/18 2334       Subjective: In bed, only complains of increased edema, abdominal pain is minimal.  Has increased abdominal distention from yesterday.  Objective: Vitals:   06/20/18 0539 06/20/18 1321 06/20/18 2036 06/21/18 0512  BP: 109/79 109/72 111/90 103/82  Pulse: (!) 55 75 70 75  Resp: 19 16 17 16   Temp: 97.8 F (36.6 C) 98.8 F (37.1 C) 98.4 F (36.9 C) 98 F (36.7 C)  TempSrc: Oral Oral Oral Oral  SpO2: 99% 100% 99% 99%  Weight: 57.2 kg   59 kg    Intake/Output Summary (Last 24 hours) at 06/21/2018 1113 Last data filed at 06/21/2018 1040 Gross per 24 hour  Intake 2213.33 ml  Output -  Net 2213.33 ml   Filed Weights   06/19/18 0500 06/20/18 0539 06/21/18 0512  Weight: 51.3 kg 57.2 kg 59 kg    Examination:  General exam: Alert, awake, oriented x 3 Respiratory system: Clear to auscultation. Respiratory effort normal. Cardiovascular system:RRR. No murmurs, rubs, gallops. Gastrointestinal system: Abdomen is distended and only mildly tender to deep palpation.  Normal bowel sounds heard. Central nervous system: Alert and oriented. No focal neurological deficits. Extremities: No C/C/E, +pedal pulses Skin: No rashes, lesions or ulcers Psychiatry: Judgement and insight appear normal. Mood & affect appropriate.      Data Reviewed: I have personally reviewed following labs and imaging studies  CBC: Recent Labs  Lab 06/16/18 1706  06/17/18 0437 06/18/18 1042 06/20/18 0547 06/21/18 0537  WBC 21.3* 17.7* 19.1* 10.3 12.8*  NEUTROABS  --   --  16.2*  --   --   HGB 10.0* 8.4* 8.4* 7.8* 8.3*  HCT 33.2* 28.0* 28.2* 26.4* 29.0*  MCV 78.1* 78.7* 80.3 80.5 82.4  PLT 250 177 198 158 629   Basic Metabolic Panel: Recent Labs  Lab 06/17/18 0437 06/18/18 1042 06/19/18 0531 06/20/18 0547 06/21/18 0537  NA 135 134* 137 138 138  K 4.2 4.4 4.8 4.3 4.1  CL 107 109 112* 113* 112*  CO2 21* 19* 21* 20* 19*  GLUCOSE 181* 237* 147* 106* 119*  BUN 30* 30* 24* 21* 20  CREATININE 1.21* 0.91 0.76 0.75 0.70  CALCIUM 8.0* 8.7* 8.3* 8.3* 8.3*   GFR: Estimated Creatinine Clearance: 75.7 mL/min (by C-G formula based on SCr of 0.7 mg/dL). Liver Function Tests: Recent Labs  Lab 06/16/18 1724  AST 42*  ALT 29  ALKPHOS 311*  BILITOT 1.5*  PROT 7.6  ALBUMIN 2.3*   Recent Labs  Lab 06/16/18 1724  LIPASE 23   No results for input(s): AMMONIA in the last 168 hours. Coagulation Profile:  Recent Labs  Lab 06/16/18 1724 06/21/18 1048  INR 1.34 1.62   Cardiac Enzymes: No results for input(s): CKTOTAL, CKMB, CKMBINDEX, TROPONINI in the last 168 hours. BNP (last 3 results) No results for input(s): PROBNP in the last 8760 hours. HbA1C: No results for input(s): HGBA1C in the last 72 hours. CBG: Recent Labs  Lab 06/16/18 1732 06/17/18 0930 06/18/18 0750 06/20/18 0729 06/21/18 0735  GLUCAP 119* 147* 121* 107* 94   Lipid Profile: No results for input(s): CHOL, HDL, LDLCALC, TRIG, CHOLHDL, LDLDIRECT in the last 72 hours. Thyroid Function Tests: No results for input(s): TSH, T4TOTAL, FREET4, T3FREE, THYROIDAB in the last 72 hours. Anemia Panel: No results for input(s): VITAMINB12, FOLATE, FERRITIN, TIBC, IRON, RETICCTPCT in the last 72 hours. Urine analysis:    Component Value Date/Time   COLORURINE YELLOW 06/16/2018 [place] 06/16/2018 1706   LABSPEC 1.008 06/16/2018 1706   LABSPEC 1.020 07/24/2017  0847   PHURINE 6.0 06/16/2018 1706   GLUCOSEU NEGATIVE 06/16/2018 1706   GLUCOSEU Negative 07/24/2017 0847   HGBUR NEGATIVE 06/16/2018 1706   BILIRUBINUR NEGATIVE 06/16/2018 1706   BILIRUBINUR Negative 07/24/2017 0847   KETONESUR NEGATIVE 06/16/2018 1706   PROTEINUR NEGATIVE 06/16/2018 1706   UROBILINOGEN 0.2 07/24/2017 0847   NITRITE NEGATIVE 06/16/2018 1706   LEUKOCYTESUR NEGATIVE 06/16/2018 1706   LEUKOCYTESUR Negative 07/24/2017 0847   Sepsis Labs: @LABRCNTIP (procalcitonin:4,lacticidven:4)  ) Recent Results (from the past 240 hour(s))  Blood culture (routine x 2)     Status: None (Preliminary result)   Collection Time: 06/16/18  5:24 PM  Result Value Ref Range Status   Specimen Description   Final    BLOOD RIGHT ANTECUBITAL Performed at [facility], [place] [address] [place], [place] [address]    Special Requests   Final    BOTTLES DRAWN AEROBIC AND ANAEROBIC Blood Culture adequate volume   Culture   Final    NO GROWTH 4 DAYS Performed at [facility], [place] [address] [place], [place] [address]    Report Status PENDING  Incomplete  Blood culture (routine x 2)     Status: None (Preliminary result)   Collection Time: 06/16/18  5:24 PM  Result Value Ref Range Status   Specimen Description   Final    BLOOD PORTA CATH Performed at [facility] [address] [place], [place] [address]    Special Requests   Final    BOTTLES DRAWN AEROBIC AND ANAEROBIC Blood Culture adequate volume   Culture   Final    NO GROWTH 4 DAYS Performed at [facility], [place] [address] [place], [place] [address]    Report Status PENDING  Incomplete  Urine culture     Status: None   Collection Time: 06/16/18  5:24 PM  Result Value Ref Range Status   Specimen Description   Final    URINE, RANDOM Performed at [facility] [address], [place], [place] [address]    Special Requests   Final    NONE Performed at [facility], [place] [address], [place], [place] [address]    Culture   Final    NO GROWTH Performed at [facility], [place] [address], [place], [place] [address]    Report Status 06/18/2018 FINAL  Final         Radiology Studies: Mr 3d Recon At Scanner  Result Date: 06/19/2018 CLINICAL DATA:  Pancreatic cancer. New cystic lesions in  the liver. Fever and white count. Mildly elevated bilirubin several days ago EXAM: MRI ABDOMEN WITHOUT AND WITH CONTRAST (INCLUDING MRCP) TECHNIQUE: Multiplanar multisequence MR imaging of the abdomen was performed both before and after the administration of intravenous contrast. Heavily T2-weighted images of the biliary and pancreatic ducts were obtained, and three-dimensional MRCP images were rendered by post processing. CONTRAST:  None administered COMPARISON:  7.5 mL Gadavist FINDINGS: Lower chest:  Lung bases are clear. Hepatobiliary: Marked progression round cystic lesions primarily in the central LEFT hepatic lobes but also within the posterolateral RIGHT hepatic lobe and relatively sparing the LEFT lateral hepatic lobe. These lesions are rounded and have high signal intensity on T2 weighted imaging consistent with cysts. There is subtle thin rim enhancement of these cysts noted on the subtracted imaging (image 11201). Lesions are immediately associated the biliary tree (image 77 of series 600) however no direct communication identified. Lesions do not restrict diffusion. There is minimal biliary duct dilatation in the LEFT hepatic lobe. Pancreas: Pancreatic mass surrounding the biliary stent not well depicted better evaluated by Pearson CT Spleen: Spleen is enlarged Adrenals/urinary tract: Adrenal glands poorly evaluated. Kidneys normal Stomach/Bowel: Stomach and limited of the small bowel is unremarkable Vascular/Lymphatic: Abdominal aortic normal caliber. No retroperitoneal periportal lymphadenopathy. Musculoskeletal: No aggressive osseous lesion  IMPRESSION: 1. Multiple round cystic lesions throughout the LEFT RIGHT hepatic lobe advanced from CT 05/04/2018. Differential would include hepatic abscesses versus pancreatic metastasis versus biliary cystic process. Lesions appear to regular for abscesses. Favor pancreatic cystic metastatic disease versus cystic biliary process. FAVOR PANCREATIC METASTASIS in light of the elevated CA 19 9 and rapid advancement. 2. Moderate ascites. 3. Biliary stent in place with mild duct dilatation LEFT hepatic lobe. 4. Pancreatic mass not well evaluated.  See  comparison CT Electronically Signed   By: Suzy Bouchard M.D.   On: 06/19/2018 16:20   Mr Abdomen Mrcp Moise Boring Contast  Result Date: 06/19/2018 CLINICAL DATA:  Pancreatic cancer. New cystic lesions in the liver. Fever and white count. Mildly elevated bilirubin several days ago EXAM: MRI ABDOMEN WITHOUT AND WITH CONTRAST (INCLUDING MRCP) TECHNIQUE: Multiplanar multisequence MR imaging of the abdomen was performed both before and after the administration of intravenous contrast. Heavily T2-weighted images of the biliary and pancreatic ducts were obtained, and three-dimensional MRCP images were rendered by post processing. CONTRAST:  None administered COMPARISON:  7.5 mL Gadavist FINDINGS: Lower chest:  Lung bases are clear. Hepatobiliary: Marked progression round cystic lesions primarily in the central LEFT hepatic lobes but also within the posterolateral RIGHT hepatic lobe and relatively sparing the LEFT lateral hepatic lobe. These lesions are rounded and have high signal intensity on T2 weighted imaging consistent with cysts. There is subtle thin rim enhancement of these cysts noted on the subtracted imaging (image 11201). Lesions are immediately associated the biliary tree (image 77 of series 600) however no direct communication identified. Lesions do not restrict diffusion. There is minimal biliary duct dilatation in the LEFT hepatic lobe. Pancreas: Pancreatic mass  surrounding the biliary stent not well depicted better evaluated by Pearson CT Spleen: Spleen is enlarged Adrenals/urinary tract: Adrenal glands poorly evaluated. Kidneys normal Stomach/Bowel: Stomach and limited of the small bowel is unremarkable Vascular/Lymphatic: Abdominal aortic normal caliber. No retroperitoneal periportal lymphadenopathy. Musculoskeletal: No aggressive osseous lesion IMPRESSION: 1. Multiple round cystic lesions throughout the LEFT RIGHT hepatic lobe advanced from CT 05/04/2018. Differential would include hepatic abscesses versus pancreatic metastasis versus biliary cystic process. Lesions appear to regular for abscesses. Favor pancreatic  cystic metastatic disease versus cystic biliary process. FAVOR PANCREATIC METASTASIS in light of the elevated CA 19 9 and rapid advancement. 2. Moderate ascites. 3. Biliary stent in place with mild duct dilatation LEFT hepatic lobe. 4. Pancreatic mass not well evaluated.  See  comparison CT Electronically Signed   By: Suzy Bouchard M.D.   On: 06/19/2018 16:20        Scheduled Meds: . ferrous sulfate  325 mg Oral Q1400  . morphine  15 mg Oral Q12H  . multivitamin with minerals  1 tablet Oral Daily  . polyethylene glycol  17 g Oral Daily  . predniSONE  10 mg Oral BID WC   Continuous Infusions: . sodium chloride 10 mL/hr at 06/21/18 1002     LOS: 5 days    Time spent: 25 minutes.     Lelon Frohlich, MD [facility] Pager [phone]  If 7PM-7AM, please contact night-coverage [email or web address] Password [email or web address] 06/21/2018, 11:13 AM

## 2018-06-21 NOTE — Progress Notes (Addendum)
IP PROGRESS NOTE  Subjective:   Crystal Carlson reports increased abdominal distention.  She is eating breakfast.  She took Dilaudid for pain last night.  Objective: Vital signs in last 24 hours: Blood pressure 103/82, pulse 75, temperature 98 F (36.7 C), temperature source Oral, resp. rate 16, weight 130 lb 1.1 oz (59 kg), SpO2 99 %.  Intake/Output from previous day: 11/03 0701 - 11/04 0700 In: 2203.3 [P.O.:360; I.V.:1843.3] Out: -   Physical Exam:  HEENT: No thrush Lungs: Clear bilaterally, no respiratory distress Cardiac: Regular rate and rhythm Abdomen: Distended with ascites Extremities: Pitting edema at the lower leg bilaterally   Portacath/PICC-without erythema  Lab Results: Recent Labs    06/20/18 0547 06/21/18 0537  WBC 10.3 12.8*  HGB 7.8* 8.3*  HCT 26.4* 29.0*  PLT 158 154    BMET Recent Labs    06/20/18 0547 06/21/18 0537  NA 138 138  K 4.3 4.1  CL 113* 112*  CO2 20* 19*  GLUCOSE 106* 119*  BUN 21* 20  CREATININE 0.75 0.70  CALCIUM 8.3* 8.3*    Lab Results  Component Value Date   CEA1 1.3 05/25/2017    Studies/Results: Mr 3d Recon At Scanner  Result Date: 06/19/2018 CLINICAL DATA:  Pancreatic cancer. New cystic lesions in the liver. Fever and white count. Mildly elevated bilirubin several days ago EXAM: MRI ABDOMEN WITHOUT AND WITH CONTRAST (INCLUDING MRCP) TECHNIQUE: Multiplanar multisequence MR imaging of the abdomen was performed both before and after the administration of intravenous contrast. Heavily T2-weighted images of the biliary and pancreatic ducts were obtained, and three-dimensional MRCP images were rendered by post processing. CONTRAST:  None administered COMPARISON:  7.5 mL Gadavist FINDINGS: Lower chest:  Lung bases are clear. Hepatobiliary: Marked progression round cystic lesions primarily in the central LEFT hepatic lobes but also within the posterolateral RIGHT hepatic lobe and relatively sparing the LEFT lateral hepatic lobe.  These lesions are rounded and have high signal intensity on T2 weighted imaging consistent with cysts. There is subtle thin rim enhancement of these cysts noted on the subtracted imaging (image 11201). Lesions are immediately associated the biliary tree (image 77 of series 600) however no direct communication identified. Lesions do not restrict diffusion. There is minimal biliary duct dilatation in the LEFT hepatic lobe. Pancreas: Pancreatic mass surrounding the biliary stent not well depicted better evaluated by Pearson CT Spleen: Spleen is enlarged Adrenals/urinary tract: Adrenal glands poorly evaluated. Kidneys normal Stomach/Bowel: Stomach and limited of the small bowel is unremarkable Vascular/Lymphatic: Abdominal aortic normal caliber. No retroperitoneal periportal lymphadenopathy. Musculoskeletal: No aggressive osseous lesion IMPRESSION: 1. Multiple round cystic lesions throughout the LEFT RIGHT hepatic lobe advanced from CT 05/04/2018. Differential would include hepatic abscesses versus pancreatic metastasis versus biliary cystic process. Lesions appear to regular for abscesses. Favor pancreatic cystic metastatic disease versus cystic biliary process. FAVOR PANCREATIC METASTASIS in light of the elevated CA 19 9 and rapid advancement. 2. Moderate ascites. 3. Biliary stent in place with mild duct dilatation LEFT hepatic lobe. 4. Pancreatic mass not well evaluated.  See  comparison CT Electronically Signed   By: Suzy Bouchard M.D.   On: 06/19/2018 16:20   Mr Abdomen Mrcp Moise Boring Contast  Result Date: 06/19/2018 CLINICAL DATA:  Pancreatic cancer. New cystic lesions in the liver. Fever and white count. Mildly elevated bilirubin several days ago EXAM: MRI ABDOMEN WITHOUT AND WITH CONTRAST (INCLUDING MRCP) TECHNIQUE: Multiplanar multisequence MR imaging of the abdomen was performed both before and after the administration of intravenous  contrast. Heavily T2-weighted images of the biliary and pancreatic ducts  were obtained, and three-dimensional MRCP images were rendered by post processing. CONTRAST:  None administered COMPARISON:  7.5 mL Gadavist FINDINGS: Lower chest:  Lung bases are clear. Hepatobiliary: Marked progression round cystic lesions primarily in the central LEFT hepatic lobes but also within the posterolateral RIGHT hepatic lobe and relatively sparing the LEFT lateral hepatic lobe. These lesions are rounded and have high signal intensity on T2 weighted imaging consistent with cysts. There is subtle thin rim enhancement of these cysts noted on the subtracted imaging (image 11201). Lesions are immediately associated the biliary tree (image 77 of series 600) however no direct communication identified. Lesions do not restrict diffusion. There is minimal biliary duct dilatation in the LEFT hepatic lobe. Pancreas: Pancreatic mass surrounding the biliary stent not well depicted better evaluated by Pearson CT Spleen: Spleen is enlarged Adrenals/urinary tract: Adrenal glands poorly evaluated. Kidneys normal Stomach/Bowel: Stomach and limited of the small bowel is unremarkable Vascular/Lymphatic: Abdominal aortic normal caliber. No retroperitoneal periportal lymphadenopathy. Musculoskeletal: No aggressive osseous lesion IMPRESSION: 1. Multiple round cystic lesions throughout the LEFT RIGHT hepatic lobe advanced from CT 05/04/2018. Differential would include hepatic abscesses versus pancreatic metastasis versus biliary cystic process. Lesions appear to regular for abscesses. Favor pancreatic cystic metastatic disease versus cystic biliary process. FAVOR PANCREATIC METASTASIS in light of the elevated CA 19 9 and rapid advancement. 2. Moderate ascites. 3. Biliary stent in place with mild duct dilatation LEFT hepatic lobe. 4. Pancreatic mass not well evaluated.  See  comparison CT Electronically Signed   By: Suzy Bouchard M.D.   On: 06/19/2018 16:20    Medications: I have reviewed the patient's current  medications.  Assessment/Plan:  1. 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 Adenocarcinoma consistent with pancreatobiliary primary.  Cycle 1 FOLFIRINOX 06/18/2017  Cycle 2FOLFIRINOX 07/02/2017  Cycle 3 FOLFIRINOX 07/16/2017 (oxaliplatin dose reduced due to mild thrombocytopenia)  CT 07/24/2017-new splenomegaly and left hydronephrosis  Cycle 4 FOLFIRINOX 08/08/2017  Cycle 5 FOLFIRINOX 08/24/2017  CT abdomen/pelvis 09/03/2017-stable liver lesions and pancreas mass, stable right biliary dilatation, possible new 8 mm right liver lesion versus a vascular phenomena, lesion at the right paracolic gutter was present in the past-significance unclear  Cycle 6 FOLFIRINOX1/29/2019  Cycle7 FOLFIRINOX2/19/2019 (Irinotecan andOxaliplatin dose reduced due to thrombocytopenia)  CT abdomen/pelvis 10/25/2017- findings concerning for enterocolitis. No bowel obstruction. Ill-defined pancreatic head mass. Multiple hepatic hypoenhancing lesions with the largest measuring 1.9 x 2.0 cm, previously 1.7 x 1.7 cm; 9 x 10 mm hypoenhancing lesion in the inferior right lobe of the liver slightly larger compared to the previous CT; 1.6 x 2.4 cm enhancing lesion in the right paracolic gutter increased in size compared to the prior CT.  Cycle 1 gemcitabine/Abraxane 11/04/2017  Cycle 2 gemcitabine/Abraxane 11/25/2017  Cycle 3 gemcitabine/Abraxane 12/08/2017  Cycle 4 gemcitabine 12/23/2017 (Abraxane held secondary to neuropathy)  CTs abdomen/pelvis 01/12/2018- decrease in size  of the pancreatic mass and hepatic metastasis; slight interval decrease in size of right paracolic gutter implant; worsening abdominal/pelvic ascites. Bowel wall thickening likely due to ascites and low albumin. 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  Cycle 5 gemcitabine 01/13/2018 (Abraxane held secondary to neuropathy)  Cycle 6 gemcitabine/Abraxane 01/27/2018  CT abdomen/pelvis 02/01/2018- dominant metastases in the liver are unchanged, multiple new low-density lesions throughout the right lobe-small abscesses?, No change in pancreas mass  Cycle 7 gemcitabine/Abraxane 02/16/2018(gemcitabine and Abraxane dose reduced due to previous neutropenia)  Cycle 8 gemcitabine/Abraxane 03/03/2018  Cycle 9 gemcitabine/Abraxane 03/25/2018  Cycle 10 gemcitabine/Abraxane 04/14/2018  Restaging CTs 05/04/2018- similar ill-defined pancreatic mass; multiple liver metastases again noted, appears slightly improved; increase in volume of abdominal and pelvic ascites; diffuse small bowel wall edema and mild edema of the ascending colon and transverse colon.  Cycle 11 gemcitabine/Abraxane 05/05/2018  Cycle 12 gemcitabine/Abraxane 06/02/2018   2. Pain secondary to #1.Improved.  3. Weight losssecondary to #1.  4. Microcytic anemia  Transfused with packed red blood cells 07/25/2017, 16 2019, and 11/27/2017  5. History of thrombocytopenia secondary to chemotherapy  6. Port-A-Cath placement 06/04/2017  7. Elevated liver enzymes/bilirubin10/22/2018-likely early biliary obstruction secondary to pancreas cancer, status post ERCP-placement of a common bile duct stent 06/09/2018  8.Postprandial abdominal pain. Protonix initiated 07/16/2017.Trial of Reglan initiated 08/24/2017.  9.Admission with fever 07/24/2017-no source for infection identified, resolved   10.Diarrheafollowing cycle 3 FOLFIRINOX-resolved  11.Left hydronephrosis noted on  CT12/03/2017-etiology unclear, urology consulted-stent not recommended.Mild left hydronephrosis noted on CT 10/25/2017. Creatinine normal 10/27/2017.  12. Admission 08/31/2017 with a high fever, blood culture positive for gram-negative rods-Klebsiella pneumoniae identified  13.Fever 10/25/2017 status post evaluation in the emergency department. CT with findings concerning for enterocolitis.   14. Admission 11/11/2017 with a high fever-blood cultures (peripheral and Port-A-Cath) positive for gram-negative rods--STENOTROPHOMONAS MALTOPHILIA;course of Septra completed as an outpatient.  15. Rash-potentially secondary to gemcitabine or infection, improved  16. Pancytopenia secondary to chemotherapy  17.Intermittent fever. Question "tumor fever", question infection. Now on Bactrim prophylaxis, improved  18. Ascites.Paracentesis 01/14/2018- reactive mesothelial cells, mixed inflammation;culture positive for Klebsiella and enterococcustreated withAugmentin  Admission with fever 01/30/2018, blood cultures negative, discharged on ciprofloxacin/Flagyl  Recurrent ascites, paracentesis 02/23/2018-culture positive for strep viridans, placed on Levaquin  Paracentesis 03/03/2018- 2 L of fluid removed, culture and cytology negative  Trial of Aldactone/Lasix started 05/26/2018  19.Status post evaluation back pain in the emergency department 04/23/2018-MRI lumbar spine with L5-S1 left subarticular disc extrusion  L5-S1 laminotomy and microdiscectomy 05/17/2018, pain resolved  20.  Admission 06/16/2018 with fever and failure to thrive  Crystal Carlson appears well today.  She has increased ascites secondary to portal hypertension and IV fluids.  I reviewed the CT and MRI images in radiology again today.  The multiple cystic liver lesions have an appearance most consistent with abscesses or bilomas.  I discussed the images and options for biopsy with interventional radiology.  A therapeutic  paracentesis is recommended prior to an ultrasound-guided aspiration of a liver lesion.  We discussed prognosis and treatment options.  She understands the prognosis will depend on results from the liver aspiration.  He remains hopeful of continuing the current chemotherapy regimen or switching to a different systemic therapy.   He has developed increased ascites and lower extremity edema.  I will decrease the IV fluids.  Recommendations: 1.  Continue broad-spectrum antibiotics, follow-up cultures 2.  Continue narcotic analgesics for pain 3.  Ultrasound paracentesis followed by aspiration of a liver lesion by interventional radiology 4.  Decrease IV fluids, consider resuming Aldactone and Lasix    LOS: 5 days   Betsy Coder, MD   06/21/2018, 7:30 AM

## 2018-06-21 NOTE — Consult Note (Signed)
Chief Complaint: Patient was seen in consultation today for liver lesion biopsy.  Referring Physician(s): Dr. Betsy Coder  Supervising Physician: Jacqulynn Cadet  Patient Status: [facility] - In-pt  History of Present Illness: Crystal Carlson is a 52 y.o. female with a past medical history significant for anxiety, depression, microcytic anemia and pancreatic cancer stage IV currently on chemotherapy. She initially presented to [facility] ED on 06/16/18 with complaints of fatigue, fever and chills x 2 days - she was admitted for sepsis management.   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 IR was consulted for aspiration of liver lesion on 11/1 - MRI abdomen with IV contrast was recommended prior to consideration of aspiration.  MRI abdomen was performed on 11/2 which showed multiple round cystic lesions throughout the central left and posterolateral right hepatic lobes which have increased since CT from 05/04/18. Images reviewed with Dr. Laurence Ferrari today who agrees to proceed with aspiration of liver lesion with paracentesis prior to aspiration.  Patient reports she feels ok right now, she is sitting up in bed eating with multiple family members at bedside. She states she was told she cannot eat today so she had her family bring her food. She states she was told about the procedure already and would like to proceed.   Past Medical History:  Diagnosis Date  . Anxiety   . Cancer of pancreas ([place]) 05/26/2017   chemo  . Chronic pancreatitis ([place]) 05/19/2017  . Depression   . Edema    LE  . Liver metastases ([place]) 05/26/2017  . Microcytic anemia  05/19/2017  . Prediabetes 2016   when living in [place]    Past Surgical History:  Procedure Laterality Date  . ERCP N/A 06/09/2017   Procedure: ENDOSCOPIC RETROGRADE CHOLANGIOPANCREATOGRAPHY (ERCP);  Surgeon: Clarene Essex, MD;  Location: [place];  Service: Endoscopy;  Laterality: N/A;  . IR FLUORO GUIDE PORT INSERTION RIGHT  06/04/2017  . IR PARACENTESIS  04/13/2018  . IR PARACENTESIS  04/30/2018  . IR PARACENTESIS  05/12/2018  . IR US GUIDE VASC ACCESS RIGHT  06/04/2017  . LUMBAR LAMINECTOMY/DECOMPRESSION MICRODISCECTOMY Left 05/17/2018   Procedure: Left Lumbar Five-Sacral One Microdiscectomy;  Surgeon: Earnie Larsson, MD;  Location: [place];  Service: Neurosurgery;  Laterality: Left;    Allergies: Pork-derived products  Medications: Prior to Admission medications   Medication Sig Start Date End Date Taking? Authorizing Provider  ferrous sulfate 325 (65 FE) MG EC tablet Take 1 tablet (325 mg total) by mouth 3 (three) times daily with meals. Patient taking differently: Take 325 mg by mouth daily at 2 PM.  06/18/17  Yes Ladell Pier, MD  furosemide (LASIX) 40 MG tablet Take 1 tablet (40 mg total) by mouth daily. 05/26/18  Yes Ladell Pier, MD  HYDROmorphone (DILAUDID) 4 MG tablet Take 1-2 tablets (4-8 mg total) by mouth every 4 (four) hours as needed for severe pain. Patient taking differently: Take 4-8 mg by mouth every 4 (four) hours as needed for severe pain (for pain).  05/05/18  Yes Owens Shark, NP  levofloxacin (LEVAQUIN) 500 MG tablet Take 1 tablet (500 mg total) by mouth daily. 05/26/18  Yes Ladell Pier, MD  lidocaine-prilocaine (EMLA) cream  Apply to port site one hour prior to use. Do not rub in. Cover with plastic. Patient taking differently: Apply 1 application topically daily as needed (prior to chemotherapy). Apply to port site one hour prior to use. Do not rub in. Cover with plastic. 11/17/17  Yes Owens Shark, NP  LORazepam (ATIVAN) 0.5 MG tablet Take 1 tablet  (0.5 mg total) by mouth every 8 (eight) hours as needed for anxiety (or nausea). 05/05/18  Yes Owens Shark, NP  magnesium citrate SOLN Take 0.75 Bottles by mouth once.   Yes [provider]  morphine (MS CONTIN) 15 MG 12 hr tablet Take 1 tablet (15 mg total) by mouth every 12 (twelve) hours. 04/14/18  Yes Ladell Pier, MD  naproxen sodium (ALEVE) 220 MG tablet Take 440 mg by mouth 2 (two) times daily as needed (fever).   Yes [provider]  predniSONE (DELTASONE) 5 MG tablet Take 1 tablet (5 mg total) by mouth 2 (two) times daily. Patient taking differently: Take 5 mg by mouth daily.  05/26/18  Yes Ladell Pier, MD  spironolactone (ALDACTONE) 100 MG tablet Take 1 tablet (100 mg total) by mouth daily. 05/26/18  Yes Ladell Pier, MD  hydrocortisone cream 1 % Apply 1 application topically 3 (three) times daily as needed for itching (minor skin irritation). Patient not taking: Reported on 05/07/2018 07/27/17   Theodis Blaze, MD  methylPREDNISolone (MEDROL DOSEPAK) 4 MG TBPK tablet Day 1: 8 mg PO before breakfast, 4 mg after lunch and after dinner, and 8 mg at bedtime  Day 2: 4 mg PO before breakfast, after lunch, and after dinner and 8 mg at bedtime  Day 3: 4 mg PO before breakfast, after lunch, after dinner, and at bedtime  Day 4: 4 mg PO before breakfast, after lunch, and at bedtime  Day 5: 4 mg PO before breakfast and at bedtime  Day 6: 4 mg PO before breakfast Patient not taking: Reported on 05/07/2018 04/23/18   Sherwood Gambler, MD     Family History  Problem Relation Age of Onset  . Hypertension Mother   . Arthritis Mother   . Diabetes Father   . Heart disease Father        ?valvular problems  . Hyperlipidemia Son   . Diabetes Brother     Social History   Socioeconomic History  . Marital status: Married    Spouse name: Not on file  . Number of children: 3  . Years of education: college grad--civics--states equivalent to High school here.  .  Highest education level: Not on file  Occupational History  . Occupation: Leisure centre manager for an [employer]  . Financial resource strain: Not on file  . Food insecurity:    Worry: Not on file    Inability: Not on file  . Transportation needs:    Medical: Not on file    Non-medical: Not on file  Tobacco Use  . Smoking status: Never Smoker  . Smokeless tobacco: Never Used  Substance and Sexual Activity  . Alcohol use: No  . Drug use: No  . Sexual activity: Not on file  Lifestyle  . Physical activity:    Days per week: Not on file    Minutes per session: Not on file  . Stress: Not on file  Relationships  . Social connections:    Talks on phone: Not on file    Gets together: Not on file  Attends religious service: Not on file    Active member of club or organization: Not on file    Attends meetings of clubs or organizations: Not on file    Relationship status: Not on file  Other Topics Concern  . Not on file  Social History Narrative   Originally from [place]   Moved to [place]. In 49   Husband came in 2000   Lives at home with husband, 3 children and mother and father in Sports [employer].     Review of Systems: A 12 point ROS discussed and pertinent positives are indicated in the HPI above.  All other systems are negative.  Review of Systems  Constitutional: Negative for appetite change, chills and fever.  Respiratory: Negative for cough and shortness of breath.   Cardiovascular: Negative for chest pain.  Gastrointestinal: Positive for abdominal distention. Negative for abdominal pain, diarrhea, nausea and vomiting.  Neurological: Negative for dizziness.  Psychiatric/Behavioral: Negative for confusion.    Vital Signs: BP 107/82   Pulse 71   Temp 98.5 F (36.9 C) (Oral)   Resp 17   Wt 130 lb 1.1 oz (59 kg)   SpO2 99%   BMI 21.64 kg/m   Physical Exam  Constitutional: She is oriented to person, place, and time. No distress.  Cardiovascular:  Normal rate, regular rhythm and normal heart sounds.  Pulmonary/Chest: Effort normal and breath sounds normal.  Abdominal: Bowel sounds are normal. She exhibits distension. There is no tenderness.  Neurological: She is alert and oriented to person, place, and time.  Skin: Skin is warm and dry. She is not diaphoretic.  Psychiatric: She has a normal mood and affect. Her behavior is normal. Judgment and thought content normal.  Nursing note and vitals reviewed.    MD Evaluation Airway: WNL Heart: WNL Abdomen: WNL Chest/ Lungs: WNL ASA  Classification: 3 Mallampati/Airway Score: Two   Imaging: Dg Chest 2 View  Result Date: 06/16/2018 CLINICAL DATA:  Feeling unwell for 3 days.  On chemotherapy. EXAM: CHEST - 2 VIEW COMPARISON:  11/11/2017 FINDINGS: Porta catheter on the right with tip at the upper cavoatrial junction. There is no edema, consolidation, effusion, or pneumothorax. Normal heart size and mediastinal contours. IMPRESSION: No evidence of active disease in the chest. Electronically Signed   By: Monte Fantasia M.D.   On: 06/16/2018 18:13   Ct Abdomen Pelvis W Contrast  Result Date: 06/16/2018 CLINICAL DATA:  History of pancreatic cancer.  Weakness. EXAM: CT ABDOMEN AND PELVIS WITH CONTRAST TECHNIQUE: Multidetector CT imaging of the abdomen and pelvis was performed using the standard protocol following bolus administration of intravenous contrast. CONTRAST:  34mL OMNIPAQUE IOHEXOL 300 MG/ML SOLN orally, 3mL OMNIPAQUE IOHEXOL 300 MG/ML SOLN intravenously. COMPARISON:  CT scan of May 04, 2018. FINDINGS: Lower chest: No acute abnormality. Hepatobiliary: Multiple low densities are noted in the right hepatic lobe which are enlarged compared to prior exam consistent with metastatic disease. The largest measures 3.0 x 2.3 cm in dome of right hepatic lobe. No gallstones are noted. Stable appearance of biliary stent extending from common hepatic duct into duodenum. Pancreas: Grossly  stable ill-defined mass is noted measuring 6.8 x 3.5 cm which extends into retroperitoneal soft tissues. There remains atrophy of the pancreatic body and tail with associated pancreatic ductal dilatation. Spleen: Maximum measured diameter of 17.5 cm which is unchanged compared to prior exam. No focal abnormality is noted. Adrenals/Urinary Tract: Adrenal glands appear normal. Mild left hydronephrosis is noted without obstructing calculus. Mild urinary bladder distention  is noted. No renal or ureteral calculi are noted. Stomach/Bowel: The stomach is unremarkable. Wall thickening of the duodenum and proximal jejunum is noted concerning for inflammation or edema. Stool is noted throughout the colon. No abnormal bowel dilatation is noted. The appendix is not visualized. Vascular/Lymphatic: Abdominal aorta is unremarkable. 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 Reproductive: Uterus and bilateral adnexa are unremarkable. Other: No abdominal wall hernia or abnormality. No abdominopelvic ascites. Musculoskeletal: No acute or significant osseous findings. IMPRESSION: Grossly stable ill-defined mass is seen arising from the pancreas measuring 6.8 x 3.5 cm, which extends into surrounding retroperitoneal soft tissues. Multiple low densities are noted in the right hepatic lobe which are increased in size compared to prior exam and consistent with worsening metastatic disease. Biliary stent is again noted. Continued wall thickening of duodenum and proximal jejunum is noted concerning for inflammation or edema. 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 Stable moderate splenomegaly. Mild left hydronephrosis is noted without obstructing calculus; potentially there may be ureteral occlusion secondary to retroperitoneal neoplasm. Electronically Signed   By: Marijo Conception, M.D.   On: 06/16/2018 21:32   Mr 3d Recon At Scanner  Result Date:  06/19/2018 CLINICAL DATA:  Pancreatic cancer. New cystic lesions in the liver. Fever and white count. Mildly elevated bilirubin several days ago EXAM: MRI ABDOMEN WITHOUT AND WITH CONTRAST (INCLUDING MRCP) TECHNIQUE: Multiplanar multisequence MR imaging of the abdomen was performed both before and after the administration of intravenous contrast. Heavily T2-weighted images of the biliary and pancreatic ducts were obtained, and three-dimensional MRCP images were rendered by post processing. CONTRAST:  None administered COMPARISON:  7.5 mL Gadavist FINDINGS: Lower chest:  Lung bases are clear. Hepatobiliary: Marked progression round cystic lesions primarily in the central LEFT hepatic lobes but also within the posterolateral RIGHT hepatic lobe and relatively sparing the LEFT lateral hepatic lobe. These lesions are rounded and have high signal intensity on T2 weighted imaging consistent with cysts. There is subtle thin rim enhancement of these cysts noted on the subtracted imaging (image 11201). Lesions are immediately associated the biliary tree (image 77 of series 600) however no direct communication identified. Lesions do not restrict diffusion. There is minimal biliary duct dilatation in the LEFT hepatic lobe. Pancreas: Pancreatic mass surrounding the biliary stent not well depicted better evaluated by Pearson CT Spleen: Spleen is enlarged Adrenals/urinary tract: Adrenal glands poorly evaluated. Kidneys normal Stomach/Bowel: Stomach and limited of the small bowel is unremarkable Vascular/Lymphatic: Abdominal aortic normal caliber. No retroperitoneal periportal lymphadenopathy. Musculoskeletal: No aggressive osseous lesion IMPRESSION: 1. Multiple round cystic lesions throughout the LEFT RIGHT hepatic lobe advanced from CT 05/04/2018. Differential would include hepatic abscesses versus pancreatic metastasis versus biliary cystic process. Lesions appear to regular for abscesses. Favor pancreatic cystic metastatic  disease versus cystic biliary process. FAVOR PANCREATIC METASTASIS in light of the elevated CA 19 9 and rapid advancement. 2. Moderate ascites. 3. Biliary stent in place with mild duct dilatation LEFT hepatic lobe. 4. Pancreatic mass not well evaluated.  See  comparison CT Electronically Signed   By: Suzy Bouchard M.D.   On: 06/19/2018 16:20   US Abdomen Limited  Result Date: 06/18/2018 CLINICAL DATA:  Pancreatic carcinoma and ascites. Prior paracentesis procedures with the most recent on 05/26/2018. EXAM: LIMITED ABDOMEN ULTRASOUND FOR ASCITES TECHNIQUE: Limited ultrasound survey for ascites was performed in all four abdominal quadrants. COMPARISON:  CT on 06/16/2018 FINDINGS: There is only a small volume  of fluid in the peritoneal cavity, primarily around the liver. There was not enough fluid to warrant paracentesis. IMPRESSION: Small volume of ascites.  Paracentesis was not performed today. Electronically Signed   By: Aletta Edouard M.D.   On: 06/18/2018 16:33   US Paracentesis  Result Date: 05/26/2018 INDICATION: Pancreatic cancer. Recurrent ascites. Request for therapeutic paracentesis. EXAM: ULTRASOUND GUIDED PARACENTESIS MEDICATIONS: 1% lidocaine 10 mL COMPLICATIONS: None immediate. PROCEDURE: Informed written consent was obtained from the patient after a discussion of the risks, benefits and alternatives to treatment. A timeout was performed prior to the initiation of the procedure. Initial ultrasound scanning demonstrates a large amount of ascites within the right lower abdominal quadrant. The right lower abdomen was prepped and draped in the usual sterile fashion. 1% lidocaine with epinephrine was used for local anesthesia. Following this, a 19 gauge, 7-cm, Yueh catheter was introduced. An ultrasound image was saved for documentation purposes. The paracentesis was performed. The catheter was removed and a dressing was applied. The patient tolerated the procedure well without immediate post  procedural complication. FINDINGS: A total of approximately 5.5 L of clear yellow fluid was removed. IMPRESSION: Successful ultrasound-guided paracentesis yielding 5.4 liters of peritoneal fluid. Read by: Gareth Eagle, PA-C Electronically Signed   By: Sandi Mariscal M.D.   On: 05/26/2018 14:59   Mr Abdomen Mrcp Moise Boring Contast  Result Date: 06/19/2018 CLINICAL DATA:  Pancreatic cancer. New cystic lesions in the liver. Fever and white count. Mildly elevated bilirubin several days ago EXAM: MRI ABDOMEN WITHOUT AND WITH CONTRAST (INCLUDING MRCP) TECHNIQUE: Multiplanar multisequence MR imaging of the abdomen was performed both before and after the administration of intravenous contrast. Heavily T2-weighted images of the biliary and pancreatic ducts were obtained, and three-dimensional MRCP images were rendered by post processing. CONTRAST:  None administered COMPARISON:  7.5 mL Gadavist FINDINGS: Lower chest:  Lung bases are clear. Hepatobiliary: Marked progression round cystic lesions primarily in the central LEFT hepatic lobes but also within the posterolateral RIGHT hepatic lobe and relatively sparing the LEFT lateral hepatic lobe. These lesions are rounded and have high signal intensity on T2 weighted imaging consistent with cysts. There is subtle thin rim enhancement of these cysts noted on the subtracted imaging (image 11201). Lesions are immediately associated the biliary tree (image 77 of series 600) however no direct communication identified. Lesions do not restrict diffusion. There is minimal biliary duct dilatation in the LEFT hepatic lobe. Pancreas: Pancreatic mass surrounding the biliary stent not well depicted better evaluated by Pearson CT Spleen: Spleen is enlarged Adrenals/urinary tract: Adrenal glands poorly evaluated. Kidneys normal Stomach/Bowel: Stomach and limited of the small bowel is unremarkable Vascular/Lymphatic: Abdominal aortic normal caliber. No retroperitoneal periportal lymphadenopathy.  Musculoskeletal: No aggressive osseous lesion IMPRESSION: 1. Multiple round cystic lesions throughout the LEFT RIGHT hepatic lobe advanced from CT 05/04/2018. Differential would include hepatic abscesses versus pancreatic metastasis versus biliary cystic process. Lesions appear to regular for abscesses. Favor pancreatic cystic metastatic disease versus cystic biliary process. FAVOR PANCREATIC METASTASIS in light of the elevated CA 19 9 and rapid advancement. 2. Moderate ascites. 3. Biliary stent in place with mild duct dilatation LEFT hepatic lobe. 4. Pancreatic mass not well evaluated.  See  comparison CT Electronically Signed   By: Suzy Bouchard M.D.   On: 06/19/2018 16:20    Labs:  CBC: Recent Labs    06/17/18 0437 06/18/18 1042 06/20/18 0547 06/21/18 0537  WBC 17.7* 19.1* 10.3 12.8*  HGB 8.4* 8.4* 7.8* 8.3*  HCT 28.0*  28.2* 26.4* 29.0*  PLT 177 198 158 154    COAGS: Recent Labs    11/11/17 1931 01/30/18 1400 06/16/18 1724 06/21/18 1048  INR 1.35 1.24 1.34 1.62  APTT 40*  --   --   --     BMP: Recent Labs    06/18/18 1042 06/19/18 0531 06/20/18 0547 06/21/18 0537  NA 134* 137 138 138  K 4.4 4.8 4.3 4.1  CL 109 112* 113* 112*  CO2 19* 21* 20* 19*  GLUCOSE 237* 147* 106* 119*  BUN 30* 24* 21* 20  CALCIUM 8.7* 8.3* 8.3* 8.3*  CREATININE 0.91 0.76 0.75 0.70  GFRNONAA >60 >60 >60 >60  GFRAA >60 >60 >60 >60    LIVER FUNCTION TESTS: Recent Labs    05/05/18 1018 05/26/18 1241 06/02/18 0945 06/16/18 1724  BILITOT 1.1 1.0 1.5* 1.5*  AST 33 33 37 42*  ALT 25 17 25 29   ALKPHOS 230* 248* 261* 311*  PROT 6.8 6.6 7.6 7.6  ALBUMIN 2.2* 2.1* 2.4* 2.3*    TUMOR MARKERS: No results for input(s): AFPTM, CEA, CA199, CHROMGRNA in the last 8760 hours.  Assessment and Plan:  Patient with known pancreatic cancer stage IV s/p chemotherapy followed by Dr. Benay Spice who was admitted to [facility] with sepsis on 10/30. Recent imaging shows multiple lesions in the liver - there is  question of abscesses vs metastasis and request has been made to IR for aspiration of one of these lesions to determine further treatment needs. Patient reviewed with Dr. Laurence Ferrari today who agrees to proceed with aspiration of liver lesion with pre-procedure paracentesis for ascites.   Patient is currently afebrile, WBC 12.8, H/H 8.3/29.0, INR 1.62 - she is planned for procedure in IR on 11/5; orders for NPO after midnight were placed and I discussed the importance of complying with NPO for her safety to which she stated understanding. She understands that she will undergo a paracentesis prior to liver lesion aspiration. She is agreeable to both procedures and wishes to proceed.  Risks and benefits discussed with the patient including bleeding, infection, damage to adjacent structures, bowel perforation/fistula connection, and sepsis.  All of the patient's questions were answered, patient is agreeable to proceed.  Consent signed and in chart.  Joaquim Nam PA-C 06/21/2018 2:33 PM     I spent a total of 40 Minutes  in face to face in clinical consultation, greater than 50% of which was counseling/coordinating care for liver lesion aspiration and paracentesis.

## 2018-06-22 ENCOUNTER — Inpatient Hospital Stay (HOSPITAL_COMMUNITY): Payer: Medicaid Other

## 2018-06-22 DIAGNOSIS — E440 Moderate protein-calorie malnutrition: Secondary | ICD-10-CM

## 2018-06-22 DIAGNOSIS — R160 Hepatomegaly, not elsewhere classified: Secondary | ICD-10-CM

## 2018-06-22 DIAGNOSIS — N179 Acute kidney failure, unspecified: Secondary | ICD-10-CM

## 2018-06-22 DIAGNOSIS — D509 Iron deficiency anemia, unspecified: Secondary | ICD-10-CM

## 2018-06-22 LAB — CBC
HEMATOCRIT: 24.9 % — AB (ref 36.0–46.0)
HEMOGLOBIN: 7.5 g/dL — AB (ref 12.0–15.0)
MCH: 24.4 pg — AB (ref 26.0–34.0)
MCHC: 30.1 g/dL (ref 30.0–36.0)
MCV: 81.1 fL (ref 80.0–100.0)
Platelets: 137 10*3/uL — ABNORMAL LOW (ref 150–400)
RBC: 3.07 MIL/uL — ABNORMAL LOW (ref 3.87–5.11)
RDW: 22.5 % — ABNORMAL HIGH (ref 11.5–15.5)
WBC: 13 10*3/uL — ABNORMAL HIGH (ref 4.0–10.5)
nRBC: 0.2 % (ref 0.0–0.2)

## 2018-06-22 LAB — GLUCOSE, CAPILLARY: Glucose-Capillary: 95 mg/dL (ref 70–99)

## 2018-06-22 MED ORDER — LIDOCAINE HCL 1 % IJ SOLN
INTRAMUSCULAR | Status: AC
  Filled 2018-06-22: qty 20

## 2018-06-22 MED ORDER — MIDAZOLAM HCL 2 MG/2ML IJ SOLN
INTRAMUSCULAR | Status: AC | PRN
  Administered 2018-06-22 (×2): 1 mg via INTRAVENOUS

## 2018-06-22 MED ORDER — FUROSEMIDE 40 MG PO TABS
40.0000 mg | ORAL_TABLET | Freq: Every day | ORAL | Status: DC
  Administered 2018-06-22 – 2018-06-24 (×3): 40 mg via ORAL
  Filled 2018-06-22 (×3): qty 1

## 2018-06-22 MED ORDER — FENTANYL CITRATE (PF) 100 MCG/2ML IJ SOLN
INTRAMUSCULAR | Status: AC
  Filled 2018-06-22: qty 2

## 2018-06-22 MED ORDER — SPIRONOLACTONE 100 MG PO TABS
100.0000 mg | ORAL_TABLET | Freq: Every day | ORAL | Status: DC
  Administered 2018-06-22 – 2018-06-24 (×3): 100 mg via ORAL
  Filled 2018-06-22 (×3): qty 1

## 2018-06-22 MED ORDER — MIDAZOLAM HCL 2 MG/2ML IJ SOLN
INTRAMUSCULAR | Status: AC
  Filled 2018-06-22: qty 2

## 2018-06-22 MED ORDER — FENTANYL CITRATE (PF) 100 MCG/2ML IJ SOLN
INTRAMUSCULAR | Status: AC | PRN
  Administered 2018-06-22 (×3): 50 ug via INTRAVENOUS

## 2018-06-22 NOTE — Procedures (Signed)
Paracentesis Liver aspiration times two EBL 0 Comp 0

## 2018-06-22 NOTE — Progress Notes (Signed)
IP PROGRESS NOTE  Subjective:   Crystal Carlson appears unchanged.  She relates abdominal pain to ascites..  Objective: Vital signs in last 24 hours: Blood pressure 125/87, pulse 87, temperature 98.1 F (36.7 C), temperature source Oral, resp. rate 19, weight 130 lb 1.1 oz (59 kg), SpO2 100 %.  Intake/Output from previous day: 11/04 0701 - 11/05 0700 In: 695 [I.V.:695] Out: -   Physical Exam:  HEENT: No thrush  Abdomen: Distended with ascites Extremities: Trace pitting edema at the lower leg bilaterally   Portacath/PICC-without erythema  Lab Results: Recent Labs    06/21/18 0537 06/22/18 0519  WBC 12.8* 13.0*  HGB 8.3* 7.5*  HCT 29.0* 24.9*  PLT 154 137*    BMET Recent Labs    06/20/18 0547 06/21/18 0537  NA 138 138  K 4.3 4.1  CL 113* 112*  CO2 20* 19*  GLUCOSE 106* 119*  BUN 21* 20  CREATININE 0.75 0.70  CALCIUM 8.3* 8.3*    Lab Results  Component Value Date   CEA1 1.3 05/25/2017    Studies/Results: US Guided Needle Placement  Result Date: 06/22/2018 INDICATION: Ascites.  Cystic liver lesions. EXAM: ULTRASOUND GUIDED PARACENTESIS, ULTRASOUND-GUIDED LIVER CYST ASPIRATION MEDICATIONS: None. COMPLICATIONS: None immediate. PROCEDURE: Informed written consent was obtained from the patient after a discussion of the risks, benefits and alternatives to treatment. A timeout was performed prior to the initiation of the procedure. Initial ultrasound scanning demonstrates a large amount of ascites within the right lower abdominal quadrant. The right lower abdomen was prepped and draped in the usual sterile fashion. 1% lidocaine with epinephrine was used for local anesthesia. Following this, a 6 Fr Safe-T-Centesis catheter was introduced. An ultrasound image was saved for documentation purposes. The paracentesis was performed. The catheter was removed and a dressing was applied. The patient tolerated the procedure well without immediate post procedural complication.  Subsequently, under sonographic guidance, a 21 gauge needle was inserted into 1 of the liver cysts and 5 cc clear yellow fluid was aspirated. A sample was sent for culture and a additional sample was sent for cytology. FINDINGS: A total of approximately 2.2 L of clear yellow fluid was removed. Imaging documents needle placement in 1 of the liver cysts. IMPRESSION: Successful ultrasound-guided paracentesis yielding 2.2 L liters of peritoneal fluid. Successful ultrasound-guided liver cyst aspiration for cytology and culture. Electronically Signed   By: Marybelle Killings M.D.   On: 06/22/2018 13:36   US Paracentesis  Result Date: 06/22/2018 INDICATION: Ascites.  Cystic liver lesions. EXAM: ULTRASOUND GUIDED PARACENTESIS, ULTRASOUND-GUIDED LIVER CYST ASPIRATION MEDICATIONS: None. COMPLICATIONS: None immediate. PROCEDURE: Informed written consent was obtained from the patient after a discussion of the risks, benefits and alternatives to treatment. A timeout was performed prior to the initiation of the procedure. Initial ultrasound scanning demonstrates a large amount of ascites within the right lower abdominal quadrant. The right lower abdomen was prepped and draped in the usual sterile fashion. 1% lidocaine with epinephrine was used for local anesthesia. Following this, a 6 Fr Safe-T-Centesis catheter was introduced. An ultrasound image was saved for documentation purposes. The paracentesis was performed. The catheter was removed and a dressing was applied. The patient tolerated the procedure well without immediate post procedural complication. Subsequently, under sonographic guidance, a 21 gauge needle was inserted into 1 of the liver cysts and 5 cc clear yellow fluid was aspirated. A sample was sent for culture and a additional sample was sent for cytology. FINDINGS: A total of approximately 2.2 L of clear yellow  fluid was removed. Imaging documents needle placement in 1 of the liver cysts. IMPRESSION: Successful  ultrasound-guided paracentesis yielding 2.2 L liters of peritoneal fluid. Successful ultrasound-guided liver cyst aspiration for cytology and culture. Electronically Signed   By: Marybelle Killings M.D.   On: 06/22/2018 13:36    Medications: I have reviewed the patient's current medications.  Assessment/Plan:  1. 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 Adenocarcinoma consistent with pancreatobiliary primary.  Cycle 1 FOLFIRINOX 06/18/2017  Cycle 2FOLFIRINOX 07/02/2017  Cycle 3 FOLFIRINOX 07/16/2017 (oxaliplatin dose reduced due to mild thrombocytopenia)  CT 07/24/2017-new splenomegaly and left hydronephrosis  Cycle 4 FOLFIRINOX 08/08/2017  Cycle 5 FOLFIRINOX 08/24/2017  CT abdomen/pelvis 09/03/2017-stable liver lesions and pancreas mass, stable right biliary dilatation, possible new 8 mm right liver lesion versus a vascular phenomena, lesion at the right paracolic gutter was present in the past-significance unclear  Cycle 6 FOLFIRINOX1/29/2019  Cycle7 FOLFIRINOX2/19/2019 (Irinotecan andOxaliplatin dose reduced due to thrombocytopenia)  CT abdomen/pelvis 10/25/2017- findings concerning for enterocolitis. No bowel obstruction. Ill-defined pancreatic head mass. Multiple hepatic hypoenhancing lesions with the largest measuring 1.9 x 2.0 cm, previously 1.7 x 1.7 cm; 9 x 10 mm hypoenhancing lesion in the inferior right lobe of the liver slightly larger compared to the previous CT; 1.6 x 2.4 cm enhancing lesion in the right paracolic gutter increased in  size compared to the prior CT.  Cycle 1 gemcitabine/Abraxane 11/04/2017  Cycle 2 gemcitabine/Abraxane 11/25/2017  Cycle 3 gemcitabine/Abraxane 12/08/2017  Cycle 4 gemcitabine 12/23/2017 (Abraxane held secondary to neuropathy)  CTs abdomen/pelvis 01/12/2018- decrease in size of the pancreatic mass and hepatic metastasis; slight interval decrease in size of right paracolic gutter implant; worsening abdominal/pelvic ascites. Bowel wall thickening likely due to ascites and low albumin. 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  Cycle 5 gemcitabine 01/13/2018 (Abraxane held secondary to neuropathy)  Cycle 6 gemcitabine/Abraxane 01/27/2018  CT abdomen/pelvis 02/01/2018- dominant metastases in the liver are unchanged, multiple new low-density lesions throughout the right lobe-small abscesses?, No change in pancreas mass  Cycle 7 gemcitabine/Abraxane 02/16/2018(gemcitabine and Abraxane dose reduced due to previous neutropenia)  Cycle 8 gemcitabine/Abraxane 03/03/2018  Cycle 9 gemcitabine/Abraxane 03/25/2018  Cycle 10 gemcitabine/Abraxane 04/14/2018  Restaging CTs 05/04/2018- similar ill-defined pancreatic mass; multiple liver metastases again noted, appears slightly improved; increase in volume of abdominal and pelvic ascites; diffuse small bowel wall edema and mild edema of the ascending colon and transverse colon.  Cycle 11 gemcitabine/Abraxane 05/05/2018  Cycle 12 gemcitabine/Abraxane 06/02/2018   2. Pain secondary to #1.Improved.  3. Weight losssecondary to #1.  4. Microcytic anemia  Transfused with packed red blood cells 07/25/2017, 16 2019, and 11/27/2017  5. History of thrombocytopenia secondary to chemotherapy  6. Port-A-Cath placement 06/04/2017  7. Elevated liver enzymes/bilirubin10/22/2018-likely early biliary obstruction secondary to pancreas cancer, status post ERCP-placement of a common bile duct stent 06/09/2018  8.Postprandial  abdominal pain. Protonix initiated 07/16/2017.Trial of Reglan initiated 08/24/2017.  9.Admission with fever 07/24/2017-no source for infection identified, resolved   10.Diarrheafollowing cycle 3 FOLFIRINOX-resolved  11.Left hydronephrosis noted on CT12/03/2017-etiology unclear, urology consulted-stent not recommended.Mild left hydronephrosis noted on CT 10/25/2017. Creatinine normal 10/27/2017.  12. Admission 08/31/2017 with a  high fever, blood culture positive for gram-negative rods-Klebsiella pneumoniae identified  13.Fever 10/25/2017 status post evaluation in the emergency department. CT with findings concerning for enterocolitis.   14. Admission 11/11/2017 with a high fever-blood cultures (peripheral and Port-A-Cath) positive for gram-negative rods--STENOTROPHOMONAS MALTOPHILIA;course of Septra completed as an outpatient.  15. Rash-potentially secondary to gemcitabine or infection, improved  16. Pancytopenia secondary to chemotherapy  17.Intermittent fever. Question "tumor fever", question infection. Now on Bactrim prophylaxis, improved  18. Ascites.Paracentesis 01/14/2018- reactive mesothelial cells, mixed inflammation;culture positive for Klebsiella and enterococcustreated withAugmentin  Admission with fever 01/30/2018, blood cultures negative, discharged on ciprofloxacin/Flagyl  Recurrent ascites, paracentesis 02/23/2018-culture positive for strep viridans, placed on Levaquin  Paracentesis 03/03/2018- 2 L of fluid removed, culture and cytology negative  Trial of Aldactone/Lasix started 05/26/2018  19.Status post evaluation back pain in the emergency department 04/23/2018-MRI lumbar spine with L5-S1 left subarticular disc extrusion  L5-S1 laminotomy and microdiscectomy 05/17/2018, pain resolved  20.  Admission 06/16/2018 with fever and failure to thrive  Ms. Bartram appears unchanged.  She remains afebrile.  Cultures from hospital admission are  negative. She will undergo a paracentesis and liver biopsy today.  I will resume Aldactone and Lasix.  I will follow-up on results from the liver aspiration and discussed treatment options with Ms. Linebaugh and her family.  Recommendations: 1.  Follow-up Gram stain and culture from liver aspiration 2.  Continue narcotic analgesics for pain 3.  Resume Lasix and Aldactone     LOS: 6 days   Betsy Coder, MD   06/22/2018, 1:52 PM

## 2018-06-22 NOTE — Progress Notes (Signed)
PROGRESS NOTE    Crystal Carlson  MCN:[id number] DOB: 1966-06-09 DOA: 06/16/2018 PCP: Mack Hook, MD      Brief Narrative:  Crystal Carlson is a 52 y.o. F with metastatic pancreatic cancer on chemo, iron deficiency anemia who presented with fever, chills, found in the ER to have leukocytosis and AKI.  Started on empiric antibiotics, further work up showed liver masses --> mets vs abscesses.   Assessment & Plan:  Liver masses Diagnostic ambiguity at this time if these are infectious/abscess vs metastasis.  Aspiration today, cytology and culture pending. -Follow gram stain and cytology -If gram stain positive, and not malignant cells, will restart IV antibiotics -If gram stain negative and malignant cells seen, will discuss with Oncology, likely d/c home tomorrow with Onc follow up   SIRS Afebrile overnight, has been off antibiotics now for 48 hours.WBC no change.  No potential source other than  -Monitor fever curve  Pancreatic cancer, metastatic Malignant ascites 2.2L taken off today.  Feels better, appetite good.  Off fluids, likely restart Lasix/spiro at discharge. -Continue MS contin and PRN oral dilaudid -Continue prednisone  Acute kidney injury Creatinine doubled to 1.4 at admission.  Normalized with fluids, back to baseline 0.7 mg/dL today.  Severe protein calorie malnutrition -Continue nutritional supplement  Normocytic anemia Hgb down to 7.5 today.  No clinical bleeding.  LIkely from chronic disease, chemo. -Restart iron at discharge       MDM and disposition: The below labs and imaging reports were reviewed and summarized above.  Medication management as above.  The patient was admitted with ascites, fever, malaise.  Found to have imaging findings of either liver abscess vs progression of her metastatic cancer.  Diagnostics pending.          DVT prophylaxis: SCDs Code Status: FULL Family Communication: Husband at bedside    Consultants:    Oncology  Interventional radiology  Procedures:   Paracentesis 11/5  US aspiration of liver lesions 11/5  Antimicrobials:   Vancomycin 10/30>>11/2  Cefepime 10/30>>11/2  Flagyl10/30>>11/2    Subjective: Feeling some more appetite.  Abdomen better after procedure.  No hematochezia, melena.  No fever.  No confusion.  Objective: Vitals:   06/22/18 1220 06/22/18 1225 06/22/18 1230 06/22/18 1235  BP: 106/80 126/89 (!) 123/91 125/87  Pulse: 77 92 94 87  Resp: 14 19 (!) 21 19  Temp:      TempSrc:      SpO2: 100% 100% 100% 100%  Weight:        Intake/Output Summary (Last 24 hours) at 06/22/2018 1343 Last data filed at 06/22/2018 1000 Gross per 24 hour  Intake 2110 ml  Output -  Net 2110 ml   Filed Weights   06/19/18 0500 06/20/18 0539 06/21/18 0512  Weight: 51.3 kg 57.2 kg 59 kg    Examination: General appearance: very thin adult female, alert and in no acute distress.  Lying in bed HEENT: Anicteric, conjunctiva pink, lids and lashes normal. No nasal deformity, discharge, epistaxis.  Lips moist, OP dry, no oral lesions, hearing normal.   Skin: Warm and dry.  Very pale, no jaundice.  No suspicious rashes or lesions. Cardiac: Tachycardic, regular, nl S1-S2, no murmurs appreciated.   Respiratory: Normal respiratory rate and rhythm.  CTAB without rales or wheezes. Abdomen: Abdomen soft.  No TTP. No ascites, distension, hepatosplenomegaly.   MSK: No deformities or effusions of large joints, diffuse loss of muscle mass and subcutaneous fat. Neuro: Awake and alert.  EOMI, moves all extremities. Speech  fluent.    Psych: Sensorium intact and responding to questions, attention normal. Affect blunted.  Judgment and insight appear normal.    Data Reviewed: I have personally reviewed following labs and imaging studies:  CBC: Recent Labs  Lab 06/17/18 0437 06/18/18 1042 06/20/18 0547 06/21/18 0537 06/22/18 0519  WBC 17.7* 19.1* 10.3 12.8* 13.0*  NEUTROABS  --  16.2*   --   --   --   HGB 8.4* 8.4* 7.8* 8.3* 7.5*  HCT 28.0* 28.2* 26.4* 29.0* 24.9*  MCV 78.7* 80.3 80.5 82.4 81.1  PLT 177 198 158 154 130*   Basic Metabolic Panel: Recent Labs  Lab 06/17/18 0437 06/18/18 1042 06/19/18 0531 06/20/18 0547 06/21/18 0537  NA 135 134* 137 138 138  K 4.2 4.4 4.8 4.3 4.1  CL 107 109 112* 113* 112*  CO2 21* 19* 21* 20* 19*  GLUCOSE 181* 237* 147* 106* 119*  BUN 30* 30* 24* 21* 20  CREATININE 1.21* 0.91 0.76 0.75 0.70  CALCIUM 8.0* 8.7* 8.3* 8.3* 8.3*   GFR: Estimated Creatinine Clearance: 75.7 mL/min (by C-G formula based on SCr of 0.7 mg/dL). Liver Function Tests: Recent Labs  Lab 06/16/18 1724  AST 42*  ALT 29  ALKPHOS 311*  BILITOT 1.5*  PROT 7.6  ALBUMIN 2.3*   Recent Labs  Lab 06/16/18 1724  LIPASE 23   No results for input(s): AMMONIA in the last 168 hours. Coagulation Profile: Recent Labs  Lab 06/16/18 1724 06/21/18 1048  INR 1.34 1.62   Cardiac Enzymes: No results for input(s): CKTOTAL, CKMB, CKMBINDEX, TROPONINI in the last 168 hours. BNP (last 3 results) No results for input(s): PROBNP in the last 8760 hours. HbA1C: No results for input(s): HGBA1C in the last 72 hours. CBG: Recent Labs  Lab 06/17/18 0930 06/18/18 0750 06/20/18 0729 06/21/18 0735 06/22/18 0726  GLUCAP 147* 121* 107* 94 95   Lipid Profile: No results for input(s): CHOL, HDL, LDLCALC, TRIG, CHOLHDL, LDLDIRECT in the last 72 hours. Thyroid Function Tests: No results for input(s): TSH, T4TOTAL, FREET4, T3FREE, THYROIDAB in the last 72 hours. Anemia Panel: No results for input(s): VITAMINB12, FOLATE, FERRITIN, TIBC, IRON, RETICCTPCT in the last 72 hours. Urine analysis:    Component Value Date/Time   COLORURINE YELLOW 06/16/2018 [place] 06/16/2018 1706   LABSPEC 1.008 06/16/2018 1706   LABSPEC 1.020 07/24/2017 0847   PHURINE 6.0 06/16/2018 1706   GLUCOSEU NEGATIVE 06/16/2018 1706   GLUCOSEU Negative 07/24/2017 0847   HGBUR  NEGATIVE 06/16/2018 1706   BILIRUBINUR NEGATIVE 06/16/2018 1706   BILIRUBINUR Negative 07/24/2017 0847   KETONESUR NEGATIVE 06/16/2018 1706   PROTEINUR NEGATIVE 06/16/2018 1706   UROBILINOGEN 0.2 07/24/2017 0847   NITRITE NEGATIVE 06/16/2018 1706   LEUKOCYTESUR NEGATIVE 06/16/2018 1706   LEUKOCYTESUR Negative 07/24/2017 0847   Sepsis Labs: @LABRCNTIP (procalcitonin:4,lacticacidven:4)  ) Recent Results (from the past 240 hour(s))  Blood culture (routine x 2)     Status: None   Collection Time: 06/16/18  5:24 PM  Result Value Ref Range Status   Specimen Description   Final    BLOOD RIGHT ANTECUBITAL Performed at [facility], [place] [address] [place], [place] [address]    Special Requests   Final    BOTTLES DRAWN AEROBIC AND ANAEROBIC Blood Culture adequate volume   Culture   Final    NO GROWTH 5 DAYS Performed at [facility], [place] [address], [place], [place] [address]    Report Status 06/21/2018 FINAL  Final  Blood culture (routine x 2)     Status: None   Collection Time: 06/16/18  5:24 PM  Result Value Ref Range Status   Specimen Description   Final    BLOOD PORTA CATH Performed at [employer] [address] [place], [place] [address]    Special Requests   Final    BOTTLES DRAWN AEROBIC AND ANAEROBIC Blood Culture adequate volume   Culture   Final    NO GROWTH 5 DAYS Performed at [facility], [place] [address] [place], [place] [address]    Report Status 06/21/2018 FINAL  Final  Urine culture     Status: None   Collection Time: 06/16/18  5:24 PM  Result Value Ref Range Status   Specimen Description   Final    URINE, RANDOM Performed at [place] [address], [place], [place] [address]    Special Requests   Final    NONE Performed at [facility], [place] [address] [place], [place] [address]    Culture   Final    NO GROWTH Performed at [facility], [place]  [address], [place],  [address]    Report Status 06/18/2018 FINAL  Final         Radiology Studies: US Guided Needle Placement  Result Date: 06/22/2018 INDICATION: Ascites.  Cystic liver lesions. EXAM: ULTRASOUND GUIDED PARACENTESIS, ULTRASOUND-GUIDED LIVER CYST ASPIRATION MEDICATIONS: None. COMPLICATIONS: None immediate. PROCEDURE: Informed written consent was obtained from the patient after a discussion of the risks, benefits and alternatives to treatment. A timeout was performed prior to the initiation of the procedure. Initial ultrasound scanning demonstrates a large amount of ascites within the right lower abdominal quadrant. The right lower abdomen was prepped and draped in the usual sterile fashion. 1% lidocaine with epinephrine was used for local anesthesia. Following this, a 6 Fr Safe-T-Centesis catheter was introduced. An ultrasound image was saved for documentation purposes. The paracentesis was performed. The catheter was removed and a dressing was applied. The patient tolerated the procedure well without immediate post procedural complication. Subsequently, under sonographic guidance, a 21 gauge needle was inserted into 1 of the liver cysts and 5 cc clear yellow fluid was aspirated. A sample was sent for culture and a additional sample was sent for cytology. FINDINGS: A total of approximately 2.2 L of clear yellow fluid was removed. Imaging documents needle placement in 1 of the liver cysts. IMPRESSION: Successful ultrasound-guided paracentesis yielding 2.2 L liters of peritoneal fluid. Successful ultrasound-guided liver cyst aspiration for cytology and culture. Electronically Signed   By: Marybelle Killings M.D.   On: 06/22/2018 13:36   US Paracentesis  Result Date: 06/22/2018 INDICATION: Ascites.  Cystic liver lesions. EXAM: ULTRASOUND GUIDED PARACENTESIS, ULTRASOUND-GUIDED LIVER CYST ASPIRATION MEDICATIONS: None. COMPLICATIONS: None immediate. PROCEDURE: Informed written consent was obtained  from the patient after a discussion of the risks, benefits and alternatives to treatment. A timeout was performed prior to the initiation of the procedure. Initial ultrasound scanning demonstrates a large amount of ascites within the right lower abdominal quadrant. The right lower abdomen was prepped and draped in the usual sterile fashion. 1% lidocaine with epinephrine was used for local anesthesia. Following this, a 6 Fr Safe-T-Centesis catheter was introduced. An ultrasound image was saved for documentation purposes. The paracentesis was performed. The catheter was removed and a dressing was applied. The patient tolerated the procedure well without immediate post procedural complication. Subsequently, under sonographic guidance, a 21  gauge needle was inserted into 1 of the liver cysts and 5 cc clear yellow fluid was aspirated. A sample was sent for culture and a additional sample was sent for cytology. FINDINGS: A total of approximately 2.2 L of clear yellow fluid was removed. Imaging documents needle placement in 1 of the liver cysts. IMPRESSION: Successful ultrasound-guided paracentesis yielding 2.2 L liters of peritoneal fluid. Successful ultrasound-guided liver cyst aspiration for cytology and culture. Electronically Signed   By: Marybelle Killings M.D.   On: 06/22/2018 13:36        Scheduled Meds: . fentaNYL      . fentaNYL      . ferrous sulfate  325 mg Oral Q1400  . lidocaine      . midazolam      . morphine  15 mg Oral Q12H  . multivitamin with minerals  1 tablet Oral Daily  . polyethylene glycol  17 g Oral Daily  . predniSONE  10 mg Oral BID WC   Continuous Infusions:   LOS: 6 days    Time spent: 25 minutes    Edwin Dada, MD [facility] 06/22/2018, 1:43 PM     Pager [phone] --- please page though AMION:  [email or web address] Password [email or web address] If 7PM-7AM, please contact night-coverage

## 2018-06-23 ENCOUNTER — Inpatient Hospital Stay: Payer: Medicaid Other

## 2018-06-23 ENCOUNTER — Inpatient Hospital Stay: Payer: Medicaid Other | Admitting: Oncology

## 2018-06-23 DIAGNOSIS — D61811 Other drug-induced pancytopenia: Secondary | ICD-10-CM

## 2018-06-23 DIAGNOSIS — T451X5A Adverse effect of antineoplastic and immunosuppressive drugs, initial encounter: Secondary | ICD-10-CM

## 2018-06-23 DIAGNOSIS — N1330 Unspecified hydronephrosis: Secondary | ICD-10-CM

## 2018-06-23 DIAGNOSIS — K750 Abscess of liver: Secondary | ICD-10-CM

## 2018-06-23 DIAGNOSIS — R161 Splenomegaly, not elsewhere classified: Secondary | ICD-10-CM

## 2018-06-23 LAB — BASIC METABOLIC PANEL
Anion gap: 6 (ref 5–15)
BUN: 23 mg/dL — ABNORMAL HIGH (ref 6–20)
CALCIUM: 8.1 mg/dL — AB (ref 8.9–10.3)
CO2: 23 mmol/L (ref 22–32)
CREATININE: 0.72 mg/dL (ref 0.44–1.00)
Chloride: 108 mmol/L (ref 98–111)
GFR calc Af Amer: >60 mL/min (ref >60)
Glucose, Bld: 159 mg/dL — ABNORMAL HIGH (ref 70–99)
POTASSIUM: 4.1 mmol/L (ref 3.5–5.1)
SODIUM: 137 mmol/L (ref 135–145)

## 2018-06-23 LAB — CBC
HCT: 28 % — ABNORMAL LOW (ref 36.0–46.0)
Hemoglobin: 8.5 g/dL — ABNORMAL LOW (ref 12.0–15.0)
MCH: 24.6 pg — ABNORMAL LOW (ref 26.0–34.0)
MCHC: 30.4 g/dL (ref 30.0–36.0)
MCV: 81.2 fL (ref 80.0–100.0)
PLATELETS: 153 10*3/uL (ref 150–400)
RBC: 3.45 MIL/uL — AB (ref 3.87–5.11)
RDW: 23.8 % — AB (ref 11.5–15.5)
WBC: 17.8 10*3/uL — ABNORMAL HIGH (ref 4.0–10.5)
nRBC: 0 % (ref 0.0–0.2)

## 2018-06-23 LAB — GLUCOSE, CAPILLARY: GLUCOSE-CAPILLARY: 90 mg/dL (ref 70–99)

## 2018-06-23 MED ORDER — SODIUM CHLORIDE 0.9 % IV SOLN
3.0000 g | Freq: Four times a day (QID) | INTRAVENOUS | Status: DC
  Administered 2018-06-23 – 2018-06-24 (×3): 3 g via INTRAVENOUS
  Filled 2018-06-23 (×5): qty 3

## 2018-06-23 MED ORDER — BOOST PLUS PO LIQD
237.0000 mL | Freq: Three times a day (TID) | ORAL | Status: DC
  Administered 2018-06-23 – 2018-06-24 (×2): 237 mL via ORAL
  Filled 2018-06-23 (×5): qty 237

## 2018-06-23 NOTE — Consult Note (Signed)
[facility] for Infectious Disease    Date of Admission:  06/16/2018   Total days of antibiotics: off               Reason for Consult: Liver abscess    Referring Provider: Dan[place]   Assessment: Liver abscess (vs metastatic disease) Pancreatic cancer, stage IV  Plan: 1. Will start her on unasyn 2. Await her Cx  Commnet- unasyn will cover her most recent isolates except Stenotrophomonas. It is not clear that her prev Cx will be predictive of her current process.  If home IV wish to be avoided, can send her out on augmentin.   Thank you so much for this interesting consult,  Principal Problem:   Sepsis with acute organ dysfunction [facility]) Active Problems:   Prediabetes   Pancreatic cancer metastasized to liver ([place])   Protein-calorie malnutrition, moderate (HCC)   Ascites   Anxiety   AKI (acute kidney injury) ([place])   Duodenitis   Hydronephrosis, left   Iron deficiency anemia   Protein-calorie malnutrition, severe   Cancer of head of pancreas ([place])   . ferrous sulfate  325 mg Oral Q1400  . furosemide  40 mg Oral Daily  . lactose free nutrition  237 mL Oral TID WC  . morphine  15 mg Oral Q12H  . multivitamin with minerals  1 tablet Oral Daily  . polyethylene glycol  17 g Oral Daily  . predniSONE  10 mg Oral BID WC  . spironolactone  100 mg Oral Daily    HPI: Crystal Carlson is a 52 y.o. female with hx of pancreatic CA (dx 05-2017) with mets to liver (last CTX 10-16/ cycle 12 gemcitabine-abraxane), comes to hospital on 10-30 with anorexia, f/c (101).  Her CT on adm showed: *Grossly stable ill-defined mass is seen arising from the pancreas measuring 6.8 x 3.5 cm, which extends into surrounding retroperitoneal soft tissues. *Multiple low densities are noted in the right hepatic lobe which are increased in size compared to prior exam and consistent with worsening metastatic disease. *Biliary stent is again noted. Continued wall thickening of duodenum and  proximal jejunum is noted concerning for inflammation or edema. 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 *Stable moderate splenomegaly. *Mild left hydronephrosis is noted without obstructing calculus; potentially there may be ureteral occlusion secondary to retroperitoneal neoplasm. She was started on vanco/cefepime/flagyl, since put on hold.  She also had MRI of her abd which favored her liver lesions to be metastatic disease.  She underwent u/s guided aspirate of liver x 2 (11-5). She also had a 2.2L paracentesis.  Her Cx has been ngtd and the cytology did not show malignant cells.   Review of Systems: Review of Systems  Constitutional: Positive for chills and fever.  Gastrointestinal: Negative for abdominal pain, constipation and diarrhea.  Genitourinary: Negative for dysuria.  no problems/pain from port.  Please see HPI. All other systems reviewed and negative.   Past Medical History:  Diagnosis Date  . Anxiety   . Cancer of pancreas ([place]) 05/26/2017   chemo  . Chronic pancreatitis ([place]) 05/19/2017  . Depression   . Edema    LE  . Liver metastases ([place]) 05/26/2017  . Microcytic anemia 05/19/2017  . Prediabetes 2016   when living in [place] Use  . Smoking status: Never Smoker  . Smokeless tobacco: Never Used  Substance Use Topics  . Alcohol use: No  .  Drug use: No    Family History  Problem Relation Age of Onset  . Hypertension Mother   . Arthritis Mother   . Diabetes Father   . Heart disease Father        ?valvular problems  . Hyperlipidemia Son   . Diabetes Brother      Medications:  Scheduled: . ferrous sulfate  325 mg Oral Q1400  . furosemide  40 mg Oral Daily  . lactose free nutrition  237 mL Oral TID WC  . morphine  15 mg Oral Q12H  . multivitamin with minerals  1 tablet Oral Daily  . polyethylene glycol  17 g Oral Daily  . predniSONE  10 mg Oral BID WC  . spironolactone  100 mg Oral Daily     Abtx:  Anti-infectives (From admission, onward)   Start     Dose/Rate Route Frequency Ordered Stop   06/20/18 0600  vancomycin (VANCOCIN) IVPB 1000 mg/200 mL premix  Status:  Discontinued     1,000 mg 200 mL/hr over 60 Minutes Intravenous Every 24 hours 06/19/18 0919 06/20/18 1009   06/19/18 1400  ceFEPIme (MAXIPIME) 1 g in sodium chloride 0.9 % 100 mL IVPB  Status:  Discontinued     1 g 200 mL/hr over 30 Minutes Intravenous Every 8 hours 06/19/18 0916 06/20/18 1009   06/17/18 1800  vancomycin (VANCOCIN) IVPB 1000 mg/200 mL premix  Status:  Discontinued     1,000 mg 200 mL/hr over 60 Minutes Intravenous Every 36 hours 06/17/18 0022 06/19/18 0919   06/17/18 0800  ceFEPIme (MAXIPIME) 1 g in sodium chloride 0.9 % 100 mL IVPB  Status:  Discontinued     1 g 200 mL/hr over 30 Minutes Intravenous Every 12 hours 06/17/18 0022 06/19/18 0916   06/16/18 1815  ceFEPIme (MAXIPIME) 2 g in sodium chloride 0.9 % 100 mL IVPB     2 g 200 mL/hr over 30 Minutes Intravenous  Once 06/16/18 1804 06/16/18 1903   06/16/18 1815  metroNIDAZOLE (FLAGYL) IVPB 500 mg  Status:  Discontinued     500 mg 100 mL/hr over 60 Minutes Intravenous Every 8 hours 06/16/18 1804 06/20/18 1009   06/16/18 1815  vancomycin (VANCOCIN) IVPB 1000 mg/200 mL premix     1,000 mg 200 mL/hr over 60 Minutes Intravenous  Once 06/16/18 1804 06/16/18 2334        OBJECTIVE: Blood pressure (!) 105/92, pulse 77, temperature 98 F (36.7 C), temperature source Oral, resp. rate 19, weight 59 kg, SpO2 100 %.  Physical Exam  Constitutional: She is oriented to person, place, and time. No distress.  HENT:  Mouth/Throat: No oropharyngeal exudate.  Eyes: Pupils are equal, round, and reactive to light. EOM are normal. No scleral icterus.  Neck: Normal range of motion. Neck supple.  Cardiovascular: Normal rate, regular rhythm and normal heart sounds.  Pulmonary/Chest: Effort normal and breath sounds normal.    Abdominal: Soft. Bowel sounds  are normal. She exhibits distension. There is no tenderness. There is no guarding.  Musculoskeletal: She exhibits no edema.  Lymphadenopathy:    She has no cervical adenopathy.  Neurological: She is alert and oriented to person, place, and time.  Skin: She is not diaphoretic.  Psychiatric: She has a normal mood and affect.    Lab Results Results for orders placed or performed during the hospital encounter of 06/16/18 (from the past 48 hour(s))  CBC     Status: Abnormal   Collection Time: 06/22/18  5:19 AM  Result  Value Ref Range   WBC 13.0 (H) 4.0 - 10.5 K/uL   RBC 3.07 (L) 3.87 - 5.11 MIL/uL   Hemoglobin 7.5 (L) 12.0 - 15.0 g/dL   HCT 24.9 (L) 36.0 - 46.0 %   MCV 81.1 80.0 - 100.0 fL   MCH 24.4 (L) 26.0 - 34.0 pg   MCHC 30.1 30.0 - 36.0 g/dL   RDW 22.5 (H) 11.5 - 15.5 %   Platelets 137 (L) 150 - 400 K/uL   nRBC 0.2 0.0 - 0.2 %    Comment: Performed at [facility], [place] [address] [place], [place] [address]  Glucose, capillary     Status: None   Collection Time: 06/22/18  7:26 AM  Result Value Ref Range   Glucose-Capillary 95 70 - 99 mg/dL  Aerobic/Anaerobic Culture (surgical/deep wound)     Status: None (Preliminary result)   Collection Time: 06/22/18  1:04 PM  Result Value Ref Range   Specimen Description      LIVER Performed at [facility] [place] [address], [place], [place] [address]    Special Requests      NONE Performed at [facility], [place] [address], [place], [place] [address]    Gram Stain NO WBC SEEN NO ORGANISMS SEEN     Culture      NO GROWTH < 24 HOURS Performed at [facility], [place] [address], [place], [place] [address]    Report Status PENDING   CBC     Status: Abnormal   Collection Time: 06/23/18  3:20 AM  Result Value Ref Range   WBC 17.8 (H) 4.0 - 10.5 K/uL   RBC 3.45 (L) 3.87 - 5.11 MIL/uL   Hemoglobin 8.5 (L) 12.0 - 15.0 g/dL   HCT 28.0 (L) 36.0 - 46.0 %   MCV 81.2 80.0 - 100.0  fL   MCH 24.6 (L) 26.0 - 34.0 pg   MCHC 30.4 30.0 - 36.0 g/dL   RDW 23.8 (H) 11.5 - 15.5 %   Platelets 153 150 - 400 K/uL   nRBC 0.0 0.0 - 0.2 %    Comment: Performed at [facility], [place] [address] [place], [place] [address]  Basic metabolic panel     Status: Abnormal   Collection Time: 06/23/18  3:20 AM  Result Value Ref Range   Sodium 137 135 - 145 mmol/L   Potassium 4.1 3.5 - 5.1 mmol/L   Chloride 108 98 - 111 mmol/L   CO2 23 22 - 32 mmol/L   Glucose, Bld 159 (H) 70 - 99 mg/dL   BUN 23 (H) 6 - 20 mg/dL   Creatinine, Ser 0.72 0.44 - 1.00 mg/dL   Calcium 8.1 (L) 8.9 - 10.3 mg/dL   GFR calc non Af Amer >60 >60 mL/min   GFR calc Af Amer >60 >60 mL/min    Comment: (NOTE) The eGFR has been calculated using the CKD EPI equation. This calculation has not been validated in all clinical situations. eGFR's persistently <60 mL/min signify possible Chronic Kidney Disease.    Anion gap 6 5 - 15    Comment: Performed at [facility] [place] [address] [place],  [address]  Glucose, capillary     Status: None   Collection Time: 06/23/18  7:28 AM  Result Value Ref Range   Glucose-Capillary 90 70 - 99 mg/dL      Component Value Date/Time   SDES  06/22/2018 1304    LIVER  Performed at [facility], [place] [address] [place], [place] [address]    SPECREQUEST  06/22/2018 1304    NONE Performed at [facility], [place] [address], [place], [place] [address]    CULT  06/22/2018 1304    NO GROWTH < 24 HOURS Performed at [facility] [address], [place], [place] [address]    REPTSTATUS PENDING 06/22/2018 1304   US Guided Needle Placement  Result Date: 06/22/2018 INDICATION: Ascites.  Cystic liver lesions. EXAM: ULTRASOUND GUIDED PARACENTESIS, ULTRASOUND-GUIDED LIVER CYST ASPIRATION MEDICATIONS: None. COMPLICATIONS: None immediate. PROCEDURE: Informed written consent was obtained from the patient after a  discussion of the risks, benefits and alternatives to treatment. A timeout was performed prior to the initiation of the procedure. Initial ultrasound scanning demonstrates a large amount of ascites within the right lower abdominal quadrant. The right lower abdomen was prepped and draped in the usual sterile fashion. 1% lidocaine with epinephrine was used for local anesthesia. Following this, a 6 Fr Safe-T-Centesis catheter was introduced. An ultrasound image was saved for documentation purposes. The paracentesis was performed. The catheter was removed and a dressing was applied. The patient tolerated the procedure well without immediate post procedural complication. Subsequently, under sonographic guidance, a 21 gauge needle was inserted into 1 of the liver cysts and 5 cc clear yellow fluid was aspirated. A sample was sent for culture and a additional sample was sent for cytology. FINDINGS: A total of approximately 2.2 L of clear yellow fluid was removed. Imaging documents needle placement in 1 of the liver cysts. IMPRESSION: Successful ultrasound-guided paracentesis yielding 2.2 L liters of peritoneal fluid. Successful ultrasound-guided liver cyst aspiration for cytology and culture. Electronically Signed   By: Marybelle Killings M.D.   On: 06/22/2018 13:36   US Paracentesis  Result Date: 06/22/2018 INDICATION: Ascites.  Cystic liver lesions. EXAM: ULTRASOUND GUIDED PARACENTESIS, ULTRASOUND-GUIDED LIVER CYST ASPIRATION MEDICATIONS: None. COMPLICATIONS: None immediate. PROCEDURE: Informed written consent was obtained from the patient after a discussion of the risks, benefits and alternatives to treatment. A timeout was performed prior to the initiation of the procedure. Initial ultrasound scanning demonstrates a large amount of ascites within the right lower abdominal quadrant. The right lower abdomen was prepped and draped in the usual sterile fashion. 1% lidocaine with epinephrine was used for local anesthesia.  Following this, a 6 Fr Safe-T-Centesis catheter was introduced. An ultrasound image was saved for documentation purposes. The paracentesis was performed. The catheter was removed and a dressing was applied. The patient tolerated the procedure well without immediate post procedural complication. Subsequently, under sonographic guidance, a 21 gauge needle was inserted into 1 of the liver cysts and 5 cc clear yellow fluid was aspirated. A sample was sent for culture and a additional sample was sent for cytology. FINDINGS: A total of approximately 2.2 L of clear yellow fluid was removed. Imaging documents needle placement in 1 of the liver cysts. IMPRESSION: Successful ultrasound-guided paracentesis yielding 2.2 L liters of peritoneal fluid. Successful ultrasound-guided liver cyst aspiration for cytology and culture. Electronically Signed   By: Marybelle Killings M.D.   On: 06/22/2018 13:36   Recent Results (from the past 240 hour(s))  Blood culture (routine x 2)     Status: None   Collection Time: 06/16/18  5:24 PM  Result Value Ref Range Status   Specimen Description   Final    BLOOD RIGHT ANTECUBITAL Performed at [facility] [address] [place], [place] [address]  Special Requests   Final    BOTTLES DRAWN AEROBIC AND ANAEROBIC Blood Culture adequate volume   Culture   Final    NO GROWTH 5 DAYS Performed at [facility], [place] [address], [place], [place] [address]    Report Status 06/21/2018 FINAL  Final  Blood culture (routine x 2)     Status: None   Collection Time: 06/16/18  5:24 PM  Result Value Ref Range Status   Specimen Description   Final    BLOOD PORTA CATH Performed at [facility] [address], [place], [place] [address]    Special Requests   Final    BOTTLES DRAWN AEROBIC AND ANAEROBIC Blood Culture adequate volume   Culture   Final    NO GROWTH 5 DAYS Performed at [facility], [place] [address], [place], [place] [address]     Report Status 06/21/2018 FINAL  Final  Urine culture     Status: None   Collection Time: 06/16/18  5:24 PM  Result Value Ref Range Status   Specimen Description   Final    URINE, RANDOM Performed at [place] [address], [place], [place] [address]    Special Requests   Final    NONE Performed at [facility], [place] [address], [place], [place] [address]    Culture   Final    NO GROWTH Performed at [facility], [place] [address], [place], [place] [address]    Report Status 06/18/2018 FINAL  Final  Aerobic/Anaerobic Culture (surgical/deep wound)     Status: None (Preliminary result)   Collection Time: 06/22/18  1:04 PM  Result Value Ref Range Status   Specimen Description   Final    LIVER Performed at [facility], [place] [address], [place], [place] [address]    Special Requests   Final    NONE Performed at [facility], [place] [address] [place], [place] [address]    Gram Stain NO WBC SEEN NO ORGANISMS SEEN   Final   Culture   Final    NO GROWTH < 24 HOURS Performed at [facility], [place] [address], [place], [place] [address]    Report Status PENDING  Incomplete    Microbiology: Recent Results (from the past 240 hour(s))  Blood culture (routine x 2)     Status: None   Collection Time: 06/16/18  5:24 PM  Result Value Ref Range Status   Specimen Description   Final    BLOOD RIGHT ANTECUBITAL Performed at [facility] [address] [place], [place] [address]    Special Requests   Final    BOTTLES DRAWN AEROBIC AND ANAEROBIC Blood Culture adequate volume   Culture   Final    NO GROWTH 5 DAYS Performed at [facility], [place] [address], [place], [place] [address]    Report Status 06/21/2018 FINAL  Final  Blood culture (routine x 2)     Status: None   Collection Time: 06/16/18  5:24 PM  Result Value Ref Range Status   Specimen Description   Final    BLOOD  PORTA CATH Performed at [place] [address], [place], [place] [address]    Special Requests   Final    BOTTLES DRAWN AEROBIC AND ANAEROBIC Blood Culture adequate volume   Culture   Final    NO GROWTH 5 DAYS Performed at [facility], [place] [address], [place], [place] [address]    Report Status 06/21/2018 FINAL  Final  Urine culture     Status: None   Collection Time: 06/16/18  5:24 PM  Result Value Ref Range Status   Specimen Description   Final    URINE, RANDOM Performed at [facility] [address], [place], [place] [address]    Special Requests   Final    NONE Performed at [facility], [place] [address] [place], [place] [address]    Culture   Final    NO GROWTH Performed at [facility], [place] [address], [place], [place] [address]    Report Status 06/18/2018 FINAL  Final  Aerobic/Anaerobic Culture (surgical/deep wound)     Status: None (Preliminary result)   Collection Time: 06/22/18  1:04 PM  Result Value Ref Range Status   Specimen Description   Final    LIVER Performed at [facility], [place] [address], [place], [place] [address]    Special Requests   Final    NONE Performed at [facility], [place] [address] [place], [place] [address]    Gram Stain NO WBC SEEN NO ORGANISMS SEEN   Final   Culture   Final    NO GROWTH < 24 HOURS Performed at [facility], [place] [address], [place],  [address]    Report Status PENDING  Incomplete    Radiographs and labs were personally reviewed by me.   Bobby Rumpf, MD [facility] [phone] 06/23/2018, 8:23 PM

## 2018-06-23 NOTE — Progress Notes (Signed)
IP PROGRESS NOTE  Subjective:   Ms. Fountaine underwent an ultrasound paracentesis for 2.2 L yesterday.  This was followed by aspiration of a liver lesion.  5 cc of clear yellow fluid was aspirated. She continues to have abdominal pain.  The pain is relieved with Dilaudid. Objective: Vital signs in last 24 hours: Blood pressure 100/78, pulse 82, temperature 98.7 F (37.1 C), temperature source Oral, resp. rate 18, weight 130 lb 1.1 oz (59 kg), SpO2 100 %.  Intake/Output from previous day: 11/05 0701 - 11/06 0700 In: 1725 [I.V.:1725] Out: -   Physical Exam:    Abdomen: Mildly distended Extremities: No leg edema   Portacath/PICC-without erythema  Lab Results: Recent Labs    06/22/18 0519 06/23/18 0320  WBC 13.0* 17.8*  HGB 7.5* 8.5*  HCT 24.9* 28.0*  PLT 137* 153    BMET Recent Labs    06/21/18 0537 06/23/18 0320  NA 138 137  K 4.1 4.1  CL 112* 108  CO2 19* 23  GLUCOSE 119* 159*  BUN 20 23*  CREATININE 0.70 0.72  CALCIUM 8.3* 8.1*    Lab Results  Component Value Date   CEA1 1.3 05/25/2017    Studies/Results: US Guided Needle Placement  Result Date: 06/22/2018 INDICATION: Ascites.  Cystic liver lesions. EXAM: ULTRASOUND GUIDED PARACENTESIS, ULTRASOUND-GUIDED LIVER CYST ASPIRATION MEDICATIONS: None. COMPLICATIONS: None immediate. PROCEDURE: Informed written consent was obtained from the patient after a discussion of the risks, benefits and alternatives to treatment. A timeout was performed prior to the initiation of the procedure. Initial ultrasound scanning demonstrates a large amount of ascites within the right lower abdominal quadrant. The right lower abdomen was prepped and draped in the usual sterile fashion. 1% lidocaine with epinephrine was used for local anesthesia. Following this, a 6 Fr Safe-T-Centesis catheter was introduced. An ultrasound image was saved for documentation purposes. The paracentesis was performed. The catheter was removed and a dressing  was applied. The patient tolerated the procedure well without immediate post procedural complication. Subsequently, under sonographic guidance, a 21 gauge needle was inserted into 1 of the liver cysts and 5 cc clear yellow fluid was aspirated. A sample was sent for culture and a additional sample was sent for cytology. FINDINGS: A total of approximately 2.2 L of clear yellow fluid was removed. Imaging documents needle placement in 1 of the liver cysts. IMPRESSION: Successful ultrasound-guided paracentesis yielding 2.2 L liters of peritoneal fluid. Successful ultrasound-guided liver cyst aspiration for cytology and culture. Electronically Signed   By: Marybelle Killings M.D.   On: 06/22/2018 13:36   US Paracentesis  Result Date: 06/22/2018 INDICATION: Ascites.  Cystic liver lesions. EXAM: ULTRASOUND GUIDED PARACENTESIS, ULTRASOUND-GUIDED LIVER CYST ASPIRATION MEDICATIONS: None. COMPLICATIONS: None immediate. PROCEDURE: Informed written consent was obtained from the patient after a discussion of the risks, benefits and alternatives to treatment. A timeout was performed prior to the initiation of the procedure. Initial ultrasound scanning demonstrates a large amount of ascites within the right lower abdominal quadrant. The right lower abdomen was prepped and draped in the usual sterile fashion. 1% lidocaine with epinephrine was used for local anesthesia. Following this, a 6 Fr Safe-T-Centesis catheter was introduced. An ultrasound image was saved for documentation purposes. The paracentesis was performed. The catheter was removed and a dressing was applied. The patient tolerated the procedure well without immediate post procedural complication. Subsequently, under sonographic guidance, a 21 gauge needle was inserted into 1 of the liver cysts and 5 cc clear yellow fluid was aspirated. A sample  was sent for culture and a additional sample was sent for cytology. FINDINGS: A total of approximately 2.2 L of clear yellow fluid  was removed. Imaging documents needle placement in 1 of the liver cysts. IMPRESSION: Successful ultrasound-guided paracentesis yielding 2.2 L liters of peritoneal fluid. Successful ultrasound-guided liver cyst aspiration for cytology and culture. Electronically Signed   By: Marybelle Killings M.D.   On: 06/22/2018 13:36    Medications: I have reviewed the patient's current medications.  Assessment/Plan:  1. 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 Adenocarcinoma consistent with pancreatobiliary primary.  Cycle 1 FOLFIRINOX 06/18/2017  Cycle 2FOLFIRINOX 07/02/2017  Cycle 3 FOLFIRINOX 07/16/2017 (oxaliplatin dose reduced due to mild thrombocytopenia)  CT 07/24/2017-new splenomegaly and left hydronephrosis  Cycle 4 FOLFIRINOX 08/08/2017  Cycle 5 FOLFIRINOX 08/24/2017  CT abdomen/pelvis 09/03/2017-stable liver lesions and pancreas mass, stable right biliary dilatation, possible new 8 mm right liver lesion versus a vascular phenomena, lesion at the right paracolic gutter was present in the past-significance unclear  Cycle 6 FOLFIRINOX1/29/2019  Cycle7 FOLFIRINOX2/19/2019 (Irinotecan andOxaliplatin dose reduced due to thrombocytopenia)  CT abdomen/pelvis 10/25/2017- findings concerning for enterocolitis. No bowel obstruction. Ill-defined pancreatic head mass. Multiple hepatic hypoenhancing lesions with the largest measuring 1.9 x 2.0 cm, previously 1.7 x 1.7 cm; 9 x 10 mm hypoenhancing lesion in the inferior right lobe of the liver slightly larger compared  to the previous CT; 1.6 x 2.4 cm enhancing lesion in the right paracolic gutter increased in size compared to the prior CT.  Cycle 1 gemcitabine/Abraxane 11/04/2017  Cycle 2 gemcitabine/Abraxane 11/25/2017  Cycle 3 gemcitabine/Abraxane 12/08/2017  Cycle 4 gemcitabine 12/23/2017 (Abraxane held secondary to neuropathy)  CTs abdomen/pelvis 01/12/2018- decrease in size of the pancreatic mass and hepatic metastasis; slight interval decrease in size of right paracolic gutter implant; worsening abdominal/pelvic ascites. Bowel wall thickening likely due to ascites and low albumin. 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  Cycle 5 gemcitabine 01/13/2018 (Abraxane held secondary to neuropathy)  Cycle 6 gemcitabine/Abraxane 01/27/2018  CT abdomen/pelvis 02/01/2018- dominant metastases in the liver are unchanged, multiple new low-density lesions throughout the right lobe-small abscesses?, No change in pancreas mass  Cycle 7 gemcitabine/Abraxane 02/16/2018(gemcitabine and Abraxane dose reduced due to previous neutropenia)  Cycle 8 gemcitabine/Abraxane 03/03/2018  Cycle 9 gemcitabine/Abraxane 03/25/2018  Cycle 10 gemcitabine/Abraxane 04/14/2018  Restaging CTs 05/04/2018- similar ill-defined pancreatic mass; multiple liver metastases again noted, appears slightly improved; increase in volume of abdominal and pelvic ascites; diffuse small bowel wall edema and mild edema of the ascending colon and transverse colon.  Cycle 11 gemcitabine/Abraxane 05/05/2018  Cycle 12 gemcitabine/Abraxane 06/02/2018  CT 06/16/2018-stable pancreas mass extending into retroperitoneal soft tissue, multiple low-density lesions in the right liver-increased in size probable superior mesenteric vein thrombosis, mild left hydronephrosis  Ultrasound aspiration of a liver lesion 06/22/2018   2. Pain secondary to #1.Improved.  3. Weight losssecondary to #1.  4. Microcytic anemia  Transfused  with packed red blood cells 07/25/2017, 16 2019, and 11/27/2017  5. History of thrombocytopenia secondary to chemotherapy  6. Port-A-Cath placement 06/04/2017  7. Elevated liver enzymes/bilirubin10/22/2018-likely early biliary obstruction secondary to pancreas cancer, status post ERCP-placement of a common  bile duct stent 06/09/2018  8.Postprandial abdominal pain. Protonix initiated 07/16/2017.Trial of Reglan initiated 08/24/2017.  9.Admission with fever 07/24/2017-no source for infection identified, resolved   10.Diarrheafollowing cycle 3 FOLFIRINOX-resolved  11.Left hydronephrosis noted on CT12/03/2017-etiology unclear, urology consulted-stent not recommended.Mild left hydronephrosis noted on CT 10/25/2017. Creatinine normal 10/27/2017.  12. Admission 08/31/2017 with a high fever, blood culture positive for gram-negative rods-Klebsiella pneumoniae identified  13.Fever 10/25/2017 status post evaluation in the emergency department. CT with findings concerning for enterocolitis.   14. Admission 11/11/2017 with a high fever-blood cultures (peripheral and Port-A-Cath) positive for gram-negative rods--STENOTROPHOMONAS MALTOPHILIA;course of Septra completed as an outpatient.  15. Rash-potentially secondary to gemcitabine or infection, improved  16. Pancytopenia secondary to chemotherapy  17.Intermittent fever. Question "tumor fever", question infection. Now on Bactrim prophylaxis, improved  18. Ascites.Paracentesis 01/14/2018- reactive mesothelial cells, mixed inflammation;culture positive for Klebsiella and enterococcustreated withAugmentin  Admission with fever 01/30/2018, blood cultures negative, discharged on ciprofloxacin/Flagyl  Recurrent ascites, paracentesis 02/23/2018-culture positive for strep viridans, placed on Levaquin  Paracentesis 03/03/2018- 2 L of fluid removed, culture and cytology negative  Trial of Aldactone/Lasix started  05/26/2018  19.Status post evaluation back pain in the emergency department 04/23/2018-MRI lumbar spine with L5-S1 left subarticular disc extrusion  L5-S1 laminotomy and microdiscectomy 05/17/2018, pain resolved  20.  Admission 06/16/2018 with fever and failure to thrive  Ms. Aquilar appears stable.  She underwent a paracentesis and aspiration of a cystic liver lesion yesterday.  The Gram stain is negative.  Cytology and culture are pending. It remains unclear whether the liver lesions represent abscesses or metastases.  It would be unusual to aspirate clear nonhemorrhagic fluid from a liver metastasis.  I recommend infectious disease consultation to consider the indication for antibiotic therapy.  She has a history of recurrent bacteremia and infected ascites..  Recommendations: 1.  Follow-up culture and cytology from liver cyst aspiration, consider infectious disease consultation 2.  Continue narcotic analgesics for pain 3.  Can you Lasix and Aldactone 4.  Follow-up will be scheduled at the Cancer center for the week of 06/28/2018     LOS: 7 days   Betsy Coder, MD   06/23/2018, 7:13 AM

## 2018-06-23 NOTE — Progress Notes (Signed)
PROGRESS NOTE    [name]  IWP:[id number] DOB: 18-Aug-1966 DOA: 06/16/2018 PCP: Mack Hook, MD      Brief Narrative:  Crystal Carlson is a 52 y.o. F with metastatic pancreatic cancer on chemo, iron deficiency anemia who presented with fever, chills, found in the ER to have leukocytosis and AKI.  Started on empiric antibiotics, further work up showed liver masses --> mets vs abscesses.   Assessment & Plan:  Liver masses Diagnostic ambiguity initially if these are infectious/abscess vs metastasis.  Aspirate of lesion actually returned fluid (I.e. They were not solid) and cytology without malignant cells.  Gram stain however, also negative for bacteria.    Of note, patient diagnosed with pancreatic cancer in 05/2017, liver lesions by MRI at the time of diagnosis, biopsy proved on 05/26/18.  -Jun 09, 2017 biliary stent placed -Jan 2019, liver lesions visualized on CT, interpreted as metastases -Aug 31, 2017: Klebsiella pneumoniae in blood -Mar 2019, again liver lesions visualized on CT, interpreted as metastases -Nov 11, 2017: Stenotrophomonas in blood -May 2019, again liver lesions visualized on CT, interpreted as metastases -Jan 14, 2018: Klebsiella/enterococcus faec in blood -June 2019, now liver lesions interpreted as "hypodense" by radiology, new interpretation as metastases vs abscesses -Starting in July, patient becomes mostly paracentesis dependent every 2-3 weeks -February 23, 2018: Viridans in ascites -Sep 2019, hypodense lesions persist -Oct 2019, hypodense lesions persist, now enlarged   -Hold antibiotics -Consult to ID     Pancreatic cancer, metastatic Malignant ascites -Continue MS contin and oral Dilaudid -Continue prednisone  Acute kidney injury Creatinine doubled to 1.4 at admission.  Normalized with fluids, stable at baseline.  Severe protein calorie malnutrition -Continue nutritional supplement  Normocytic anemia Hgb stable, no clinicaly bleeding.   LIkely from chronic disease, chemo. -Restart iron at discharge       MDM and disposition: The below labs and imaging reports were reviewed and summarized above.  Medication management as above.  Case discusesd with Oncology and infectious disease.  Extensive review of previous records, sumarized above.  The patient was admitted with ascites, fever, malaise.  Initially started on broad spectrum antibiotics.  Imaging showed either new liver lesions or progression of disease and fever thought to be from either tumor burden or infection.  Further diagnostics have been unrevealing, and we believe the patient may have some occult infection.  Consult to ID, continue current care.        DVT prophylaxis: SCDs Code Status: FULL Family Communication: Husband by phone    Consultants:   Oncology  Interventional radiology  Procedures:   Paracentesis 11/5  US aspiration of liver lesions 11/5  Antimicrobials:   Vancomycin 10/30>>11/2  Cefepime 10/30>>11/2  Flagyl10/30>>11/2    Subjective: No change in appetite.  No vomiting, no new abdominal pain.  No fever overnight, no melena or hematochezia.        Objective: Vitals:   06/22/18 1235 06/22/18 1357 06/22/18 2042 06/23/18 0607  BP: 125/87 107/87 107/86 100/78  Pulse: 87 75 87 82  Resp: 19 17 18 18   Temp:  98.2 F (36.8 C) 98.6 F (37 C) 98.7 F (37.1 C)  TempSrc:  Oral Oral Oral  SpO2: 100% 100% 99% 100%  Weight:        Intake/Output Summary (Last 24 hours) at 06/23/2018 1546 Last data filed at 06/23/2018 1300 Gross per 24 hour  Intake 480 ml  Output -  Net 480 ml   Filed Weights   06/19/18 0500 06/20/18 0539 06/21/18 5053  Weight: 51.3 kg 57.2 kg 59 kg    Examination: General appearance: Very thin adult female, sitting up in bed, interactive, tired HEENT: Anicteric, conjunctiva pink, lids and lashes normal. No nasal deformity, discharge, epistaxis.  Lips moist, OP dry, no oral lesions, hearing normal.    Skin: Pale.  Warm and dry.  No suspicious rashes or lesions. Cardiac: RRR, no murmurs, no elevation in JVP, no LE edema. Respiratory: RR normal, no rales or wheezes. Abdomen: Abdomen soft without tenderness ot palpitation.  No ascites.  No focal tenderness or guarding MSK: No deformities or effusions of large joints, diffuse loss of muscle mass and subcutaneous fat. Neuro: Awake and alert, EOMI, speech fluent, strength 5-/5 but symmetric. Psych: Sensorium intact, responding to questions, oriented to person, place, time, attention normal, affect blunted.    Data Reviewed: I have personally reviewed following labs and imaging studies:  CBC: Recent Labs  Lab 06/18/18 1042 06/20/18 0547 06/21/18 0537 06/22/18 0519 06/23/18 0320  WBC 19.1* 10.3 12.8* 13.0* 17.8*  NEUTROABS 16.2*  --   --   --   --   HGB 8.4* 7.8* 8.3* 7.5* 8.5*  HCT 28.2* 26.4* 29.0* 24.9* 28.0*  MCV 80.3 80.5 82.4 81.1 81.2  PLT 198 158 154 137* 124   Basic Metabolic Panel: Recent Labs  Lab 06/18/18 1042 06/19/18 0531 06/20/18 0547 06/21/18 0537 06/23/18 0320  NA 134* 137 138 138 137  K 4.4 4.8 4.3 4.1 4.1  CL 109 112* 113* 112* 108  CO2 19* 21* 20* 19* 23  GLUCOSE 237* 147* 106* 119* 159*  BUN 30* 24* 21* 20 23*  CREATININE 0.91 0.76 0.75 0.70 0.72  CALCIUM 8.7* 8.3* 8.3* 8.3* 8.1*   GFR: Estimated Creatinine Clearance: 75.7 mL/min (by C-G formula based on SCr of 0.72 mg/dL). Liver Function Tests: Recent Labs  Lab 06/16/18 1724  AST 42*  ALT 29  ALKPHOS 311*  BILITOT 1.5*  PROT 7.6  ALBUMIN 2.3*   Recent Labs  Lab 06/16/18 1724  LIPASE 23   No results for input(s): AMMONIA in the last 168 hours. Coagulation Profile: Recent Labs  Lab 06/16/18 1724 06/21/18 1048  INR 1.34 1.62   Cardiac Enzymes: No results for input(s): CKTOTAL, CKMB, CKMBINDEX, TROPONINI in the last 168 hours. BNP (last 3 results) No results for input(s): PROBNP in the last 8760 hours. HbA1C: No results for  input(s): HGBA1C in the last 72 hours. CBG: Recent Labs  Lab 06/18/18 0750 06/20/18 0729 06/21/18 0735 06/22/18 0726 06/23/18 0728  GLUCAP 121* 107* 94 95 90   Lipid Profile: No results for input(s): CHOL, HDL, LDLCALC, TRIG, CHOLHDL, LDLDIRECT in the last 72 hours. Thyroid Function Tests: No results for input(s): TSH, T4TOTAL, FREET4, T3FREE, THYROIDAB in the last 72 hours. Anemia Panel: No results for input(s): VITAMINB12, FOLATE, FERRITIN, TIBC, IRON, RETICCTPCT in the last 72 hours. Urine analysis:    Component Value Date/Time   COLORURINE YELLOW 06/16/2018 [place] 06/16/2018 1706   LABSPEC 1.008 06/16/2018 1706   LABSPEC 1.020 07/24/2017 0847   PHURINE 6.0 06/16/2018 1706   GLUCOSEU NEGATIVE 06/16/2018 1706   GLUCOSEU Negative 07/24/2017 0847   HGBUR NEGATIVE 06/16/2018 1706   BILIRUBINUR NEGATIVE 06/16/2018 1706   BILIRUBINUR Negative 07/24/2017 0847   KETONESUR NEGATIVE 06/16/2018 1706   PROTEINUR NEGATIVE 06/16/2018 1706   UROBILINOGEN 0.2 07/24/2017 0847   NITRITE NEGATIVE 06/16/2018 1706   LEUKOCYTESUR NEGATIVE 06/16/2018 1706   LEUKOCYTESUR Negative 07/24/2017 0847   Sepsis Labs: @LABRCNTIP (procalcitonin:4,lacticacidven:4)  )  Recent Results (from the past 240 hour(s))  Blood culture (routine x 2)     Status: None   Collection Time: 06/16/18  5:24 PM  Result Value Ref Range Status   Specimen Description   Final    BLOOD RIGHT ANTECUBITAL Performed at [facility] [address] [place], [place] [address]    Special Requests   Final    BOTTLES DRAWN AEROBIC AND ANAEROBIC Blood Culture adequate volume   Culture   Final    NO GROWTH 5 DAYS Performed at [facility], [place] [address] [place], [place] [address]    Report Status 06/21/2018 FINAL  Final  Blood culture (routine x 2)     Status: None   Collection Time: 06/16/18  5:24 PM  Result Value Ref Range Status   Specimen Description   Final    BLOOD PORTA  CATH Performed at [facility] [address] [place], [place] [address]    Special Requests   Final    BOTTLES DRAWN AEROBIC AND ANAEROBIC Blood Culture adequate volume   Culture   Final    NO GROWTH 5 DAYS Performed at [facility], [place] [address] [place], [place] [address]    Report Status 06/21/2018 FINAL  Final  Urine culture     Status: None   Collection Time: 06/16/18  5:24 PM  Result Value Ref Range Status   Specimen Description   Final    URINE, RANDOM Performed at [facility] [address] [place], [place] [address]    Special Requests   Final    NONE Performed at [facility], [place] [address], [place], [place] [address]    Culture   Final    NO GROWTH Performed at [facility], [place] [address], [place], [place] [address]    Report Status 06/18/2018 FINAL  Final  Aerobic/Anaerobic Culture (surgical/deep wound)     Status: None (Preliminary result)   Collection Time: 06/22/18  1:04 PM  Result Value Ref Range Status   Specimen Description   Final    LIVER Performed at [facility], [place] [address] [place], [place] [address]    Special Requests   Final    NONE Performed at [facility], [place] [address], [place], [place] [address]    Gram Stain NO WBC SEEN NO ORGANISMS SEEN   Final   Culture   Final    NO GROWTH < 24 HOURS Performed at [facility], [place] [address] [place], [place] [address]    Report Status PENDING  Incomplete         Radiology Studies: US Guided Needle Placement  Result Date: 06/22/2018 INDICATION: Ascites.  Cystic liver lesions. EXAM: ULTRASOUND GUIDED PARACENTESIS, ULTRASOUND-GUIDED LIVER CYST ASPIRATION MEDICATIONS: None. COMPLICATIONS: None immediate. PROCEDURE: Informed written consent was obtained from the patient after a discussion of the risks, benefits and alternatives to treatment. A timeout was performed prior to  the initiation of the procedure. Initial ultrasound scanning demonstrates a large amount of ascites within the right lower abdominal quadrant. The right lower abdomen was prepped and draped in the usual sterile fashion. 1% lidocaine with epinephrine was used for local anesthesia. Following this, a 6 Fr Safe-T-Centesis catheter was introduced. An ultrasound image was saved for documentation purposes. The paracentesis was performed. The catheter was removed and a dressing was applied. The patient tolerated the procedure well without immediate post procedural  complication. Subsequently, under sonographic guidance, a 21 gauge needle was inserted into 1 of the liver cysts and 5 cc clear yellow fluid was aspirated. A sample was sent for culture and a additional sample was sent for cytology. FINDINGS: A total of approximately 2.2 L of clear yellow fluid was removed. Imaging documents needle placement in 1 of the liver cysts. IMPRESSION: Successful ultrasound-guided paracentesis yielding 2.2 L liters of peritoneal fluid. Successful ultrasound-guided liver cyst aspiration for cytology and culture. Electronically Signed   By: Marybelle Killings M.D.   On: 06/22/2018 13:36   US Paracentesis  Result Date: 06/22/2018 INDICATION: Ascites.  Cystic liver lesions. EXAM: ULTRASOUND GUIDED PARACENTESIS, ULTRASOUND-GUIDED LIVER CYST ASPIRATION MEDICATIONS: None. COMPLICATIONS: None immediate. PROCEDURE: Informed written consent was obtained from the patient after a discussion of the risks, benefits and alternatives to treatment. A timeout was performed prior to the initiation of the procedure. Initial ultrasound scanning demonstrates a large amount of ascites within the right lower abdominal quadrant. The right lower abdomen was prepped and draped in the usual sterile fashion. 1% lidocaine with epinephrine was used for local anesthesia. Following this, a 6 Fr Safe-T-Centesis catheter was introduced. An ultrasound image was saved for  documentation purposes. The paracentesis was performed. The catheter was removed and a dressing was applied. The patient tolerated the procedure well without immediate post procedural complication. Subsequently, under sonographic guidance, a 21 gauge needle was inserted into 1 of the liver cysts and 5 cc clear yellow fluid was aspirated. A sample was sent for culture and a additional sample was sent for cytology. FINDINGS: A total of approximately 2.2 L of clear yellow fluid was removed. Imaging documents needle placement in 1 of the liver cysts. IMPRESSION: Successful ultrasound-guided paracentesis yielding 2.2 L liters of peritoneal fluid. Successful ultrasound-guided liver cyst aspiration for cytology and culture. Electronically Signed   By: Marybelle Killings M.D.   On: 06/22/2018 13:36        Scheduled Meds: . ferrous sulfate  325 mg Oral Q1400  . furosemide  40 mg Oral Daily  . lactose free nutrition  237 mL Oral TID WC  . morphine  15 mg Oral Q12H  . multivitamin with minerals  1 tablet Oral Daily  . polyethylene glycol  17 g Oral Daily  . predniSONE  10 mg Oral BID WC  . spironolactone  100 mg Oral Daily   Continuous Infusions:   LOS: 7 days    Time spent: 25 minutes    Edwin Dada, MD [facility] 06/23/2018, 3:46 PM     Pager [phone] --- please page though AMION:  [email or web address] Password [email or web address] If 7PM-7AM, please contact night-coverage

## 2018-06-24 ENCOUNTER — Telehealth: Payer: Self-pay | Admitting: Oncology

## 2018-06-24 DIAGNOSIS — K2980 Duodenitis without bleeding: Secondary | ICD-10-CM

## 2018-06-24 DIAGNOSIS — E43 Unspecified severe protein-calorie malnutrition: Secondary | ICD-10-CM

## 2018-06-24 DIAGNOSIS — R6520 Severe sepsis without septic shock: Secondary | ICD-10-CM

## 2018-06-24 DIAGNOSIS — A419 Sepsis, unspecified organism: Principal | ICD-10-CM

## 2018-06-24 LAB — CBC
HCT: 27.1 % — ABNORMAL LOW (ref 36.0–46.0)
Hemoglobin: 8.2 g/dL — ABNORMAL LOW (ref 12.0–15.0)
MCH: 24.6 pg — AB (ref 26.0–34.0)
MCHC: 30.3 g/dL (ref 30.0–36.0)
MCV: 81.1 fL (ref 80.0–100.0)
PLATELETS: 160 10*3/uL (ref 150–400)
RBC: 3.34 MIL/uL — AB (ref 3.87–5.11)
RDW: 24.1 % — ABNORMAL HIGH (ref 11.5–15.5)
WBC: 15.1 10*3/uL — ABNORMAL HIGH (ref 4.0–10.5)
nRBC: 0 % (ref 0.0–0.2)

## 2018-06-24 LAB — GLUCOSE, CAPILLARY: Glucose-Capillary: 207 mg/dL — ABNORMAL HIGH (ref 70–99)

## 2018-06-24 MED ORDER — BOOST PLUS PO LIQD: 237.0000 mL | Freq: Three times a day (TID) | ORAL | 0 refills | Status: AC

## 2018-06-24 MED ORDER — AMOXICILLIN-POT CLAVULANATE 875-125 MG PO TABS: 1.0000 | ORAL_TABLET | Freq: Two times a day (BID) | ORAL | 1 refills | Status: DC

## 2018-06-24 MED FILL — AMOX-CLAV 875-125 MG TABLET: 875-125 | 30 days supply | Qty: 60 | Fill #0

## 2018-06-24 NOTE — Progress Notes (Signed)
INFECTIOUS DISEASE PROGRESS NOTE  ID: Crystal Carlson is a 52 y.o. female with  Principal Problem:   Sepsis with acute organ dysfunction ([place]) Active Problems:   Prediabetes   Pancreatic cancer metastasized to liver ([place])   Protein-calorie malnutrition, moderate (HCC)   Ascites   Anxiety   AKI (acute kidney injury) ([place])   Duodenitis   Hydronephrosis, left   Iron deficiency anemia   Protein-calorie malnutrition, severe   Cancer of head of pancreas (HCC)  Subjective: No complaints.  Eating breakfast  Abtx:  Anti-infectives (From admission, onward)   Start     Dose/Rate Route Frequency Ordered Stop   06/23/18 2100  Ampicillin-Sulbactam (UNASYN) 3 g in sodium chloride 0.9 % 100 mL IVPB     3 g 200 mL/hr over 30 Minutes Intravenous Every 6 hours 06/23/18 2047     06/20/18 0600  vancomycin (VANCOCIN) IVPB 1000 mg/200 mL premix  Status:  Discontinued     1,000 mg 200 mL/hr over 60 Minutes Intravenous Every 24 hours 06/19/18 0919 06/20/18 1009   06/19/18 1400  ceFEPIme (MAXIPIME) 1 g in sodium chloride 0.9 % 100 mL IVPB  Status:  Discontinued     1 g 200 mL/hr over 30 Minutes Intravenous Every 8 hours 06/19/18 0916 06/20/18 1009   06/17/18 1800  vancomycin (VANCOCIN) IVPB 1000 mg/200 mL premix  Status:  Discontinued     1,000 mg 200 mL/hr over 60 Minutes Intravenous Every 36 hours 06/17/18 0022 06/19/18 0919   06/17/18 0800  ceFEPIme (MAXIPIME) 1 g in sodium chloride 0.9 % 100 mL IVPB  Status:  Discontinued     1 g 200 mL/hr over 30 Minutes Intravenous Every 12 hours 06/17/18 0022 06/19/18 0916   06/16/18 1815  ceFEPIme (MAXIPIME) 2 g in sodium chloride 0.9 % 100 mL IVPB     2 g 200 mL/hr over 30 Minutes Intravenous  Once 06/16/18 1804 06/16/18 1903   06/16/18 1815  metroNIDAZOLE (FLAGYL) IVPB 500 mg  Status:  Discontinued     500 mg 100 mL/hr over 60 Minutes Intravenous Every 8 hours 06/16/18 1804 06/20/18 1009   06/16/18 1815  vancomycin (VANCOCIN) IVPB 1000 mg/200 mL premix      1,000 mg 200 mL/hr over 60 Minutes Intravenous  Once 06/16/18 1804 06/16/18 2334      Medications:  Scheduled: . ferrous sulfate  325 mg Oral Q1400  . furosemide  40 mg Oral Daily  . lactose free nutrition  237 mL Oral TID WC  . morphine  15 mg Oral Q12H  . multivitamin with minerals  1 tablet Oral Daily  . polyethylene glycol  17 g Oral Daily  . predniSONE  10 mg Oral BID WC  . spironolactone  100 mg Oral Daily    Objective: Vital signs in last 24 hours: Temp:  [97.9 F (36.6 C)-98 F (36.7 C)] 97.9 F (36.6 C) (11/07 0549) Pulse Rate:  [68-77] 68 (11/07 0549) Resp:  [18-19] 18 (11/07 0549) BP: (101-105)/(82-92) 101/82 (11/07 0549) SpO2:  [100 %] 100 % (11/07 0549) Weight:  [53.6 kg] 53.6 kg (11/07 0549)   General appearance: alert, cooperative and no distress Resp: clear to auscultation bilaterally Cardio: regular rate and rhythm GI: normal findings: bowel sounds normal and soft, non-tender and abnormal findings:  distended  Lab Results Recent Labs    06/22/18 0519 06/23/18 0320  WBC 13.0* 17.8*  HGB 7.5* 8.5*  HCT 24.9* 28.0*  NA  --  137  K  --  4.1  CL  --  108  CO2  --  23  BUN  --  23*  CREATININE  --  0.72   Liver Panel No results for input(s): PROT, ALBUMIN, AST, ALT, ALKPHOS, BILITOT, BILIDIR, IBILI in the last 72 hours. Sedimentation Rate No results for input(s): ESRSEDRATE in the last 72 hours. C-Reactive Protein No results for input(s): CRP in the last 72 hours.  Microbiology: Recent Results (from the past 240 hour(s))  Blood culture (routine x 2)     Status: None   Collection Time: 06/16/18  5:24 PM  Result Value Ref Range Status   Specimen Description   Final    BLOOD RIGHT ANTECUBITAL Performed at [facility] [address] [place], [place] [address]    Special Requests   Final    BOTTLES DRAWN AEROBIC AND ANAEROBIC Blood Culture adequate volume   Culture   Final    NO GROWTH 5 DAYS Performed at [facility], [place] [address], [place], [place] [address]    Report Status 06/21/2018 FINAL  Final  Blood culture (routine x 2)     Status: None   Collection Time: 06/16/18  5:24 PM  Result Value Ref Range Status   Specimen Description   Final    BLOOD PORTA CATH Performed at [facility] [address] [place], [place] [address]    Special Requests   Final    BOTTLES DRAWN AEROBIC AND ANAEROBIC Blood Culture adequate volume   Culture   Final    NO GROWTH 5 DAYS Performed at [facility], [place] [address] [place], [place] [address]    Report Status 06/21/2018 FINAL  Final  Urine culture     Status: None   Collection Time: 06/16/18  5:24 PM  Result Value Ref Range Status   Specimen Description   Final    URINE, RANDOM Performed at [facility] [address], [place], [place] [address]    Special Requests   Final    NONE Performed at [facility], [place] [address] [place], [place] [address]    Culture   Final    NO GROWTH Performed at [facility], [place] [address] [place], [place] [address]    Report Status 06/18/2018 FINAL  Final  Aerobic/Anaerobic Culture (surgical/deep wound)     Status: None (Preliminary result)   Collection Time: 06/22/18  1:04 PM  Result Value Ref Range Status   Specimen Description   Final    LIVER Performed at [facility], [place] [address] [place], [place] [address]    Special Requests   Final    NONE Performed at [facility], [place] [address], [place], [place] [address]    Gram Stain NO WBC SEEN NO ORGANISMS SEEN   Final   Culture   Final    NO GROWTH 2 DAYS Performed at [facility], [place] [address] [place], [place] [address]    Report Status PENDING  Incomplete    Studies/Results: US Guided Needle Placement  Result Date: 06/22/2018 INDICATION: Ascites.  Cystic liver lesions. EXAM: ULTRASOUND GUIDED PARACENTESIS,  ULTRASOUND-GUIDED LIVER CYST ASPIRATION MEDICATIONS: None. COMPLICATIONS: None immediate. PROCEDURE: Informed written consent was obtained from the patient after a discussion of the risks, benefits and alternatives to treatment. A timeout was performed prior to the initiation of the procedure. Initial ultrasound scanning demonstrates a large amount of ascites within the right  lower abdominal quadrant. The right lower abdomen was prepped and draped in the usual sterile fashion. 1% lidocaine with epinephrine was used for local anesthesia. Following this, a 6 Fr Safe-T-Centesis catheter was introduced. An ultrasound image was saved for documentation purposes. The paracentesis was performed. The catheter was removed and a dressing was applied. The patient tolerated the procedure well without immediate post procedural complication. Subsequently, under sonographic guidance, a 21 gauge needle was inserted into 1 of the liver cysts and 5 cc clear yellow fluid was aspirated. A sample was sent for culture and a additional sample was sent for cytology. FINDINGS: A total of approximately 2.2 L of clear yellow fluid was removed. Imaging documents needle placement in 1 of the liver cysts. IMPRESSION: Successful ultrasound-guided paracentesis yielding 2.2 L liters of peritoneal fluid. Successful ultrasound-guided liver cyst aspiration for cytology and culture. Electronically Signed   By: Marybelle Killings M.D.   On: 06/22/2018 13:36   US Paracentesis  Result Date: 06/22/2018 INDICATION: Ascites.  Cystic liver lesions. EXAM: ULTRASOUND GUIDED PARACENTESIS, ULTRASOUND-GUIDED LIVER CYST ASPIRATION MEDICATIONS: None. COMPLICATIONS: None immediate. PROCEDURE: Informed written consent was obtained from the patient after a discussion of the risks, benefits and alternatives to treatment. A timeout was performed prior to the initiation of the procedure. Initial ultrasound scanning demonstrates a large amount of ascites within the right  lower abdominal quadrant. The right lower abdomen was prepped and draped in the usual sterile fashion. 1% lidocaine with epinephrine was used for local anesthesia. Following this, a 6 Fr Safe-T-Centesis catheter was introduced. An ultrasound image was saved for documentation purposes. The paracentesis was performed. The catheter was removed and a dressing was applied. The patient tolerated the procedure well without immediate post procedural complication. Subsequently, under sonographic guidance, a 21 gauge needle was inserted into 1 of the liver cysts and 5 cc clear yellow fluid was aspirated. A sample was sent for culture and a additional sample was sent for cytology. FINDINGS: A total of approximately 2.2 L of clear yellow fluid was removed. Imaging documents needle placement in 1 of the liver cysts. IMPRESSION: Successful ultrasound-guided paracentesis yielding 2.2 L liters of peritoneal fluid. Successful ultrasound-guided liver cyst aspiration for cytology and culture. Electronically Signed   By: Marybelle Killings M.D.   On: 06/22/2018 13:36     Assessment/Plan: Liver abscess (vs metastatic disease) Pancreatic cancer, stage IV  Total days of antibiotics: 1 unasyn  Tolerating unasyn without issue Await her Cx She could be d/c with 1) continued unasyn 3g q6h OR 2) po augmentin 875 mg BID OR 3) IV Invanz 1g IVPB qday (for ease of dosing) She wants to go home with PO which I'm ok with Duration to be determined by f/u scans  Available as needed.         Bobby Rumpf MD, FACP Infectious Diseases (pager) [phone] [email or web address] 06/24/2018, 7:46 AM  LOS: 8 days

## 2018-06-24 NOTE — Care Management Note (Addendum)
Case Management Note  Patient Details  Name: Crystal Carlson MRN: [id number] Date of Birth: 02-14-1966  Subjective/Objective:                  Discharge planning Iv abx cancelled Crystal Carlson made aware Action/Plan: Home with iv Unasyn three time a day/has port/advanced hhc will do/Crystal Carlson notified. IV abx cancelled going home on p.o. Abx. Crystal Carlson notified Expected Discharge Date:  (unknown)               Expected Discharge Plan:  [place]  In-House Referral:     Discharge planning Services  CM Consult  Post Acute Care Choice:    Choice offered to:     DME Arranged:    DME Agency:     HH Arranged:  RN, IV Antibiotics HH Agency:  [facility]  Status of Service:  In process, will continue to follow  If discussed at Long Length of Stay Meetings, dates discussed:    Additional Comments:  Crystal Cha, RN 06/24/2018, 8:42 AM

## 2018-06-24 NOTE — Discharge Summary (Signed)
Physician Discharge Summary  Crystal Carlson QMV:[id number] DOB: 12/18/1965 DOA: 06/16/2018  PCP: Mack Hook, MD  Admit date: 06/16/2018 Discharge date: 06/24/2018  Admitted From: Home  Disposition:  Home   Recommendations for Outpatient Follow-up:  1. Follow up with PCP in 1-2 weeks 2. Please obtain CBC and BMP at least monthly while on Augmentin 3. Please follow up on following results: Aerobic/anaerobic liver cyst aspirate culture   Home Health: None  Equipment/Devices: None  Discharge Condition: Fair  CODE STATUS: FULL Diet recommendation: Regular  Brief/Interim Summary: Crystal Carlson is a 52 y.o. F with metastatic pancreatic cancer on chemo, iron deficiency anemia who presented with fever, chills.  She was found in the ER to have leukocytosis and AKI.  Started on empiric antibiotics, further work up showed liver masses --> mets vs abscesses.     PRINCIPAL HOSPITAL DIAGNOSIS: Liver abscesses    Discharge Diagnoses:   Liver masses There was initially a lot of diagnostic ambiguity if these were infectious/abscess vs metastasis.  She was treated with broad spectrum antibiotics for 48 hours, and had some clinical improvement and improvement in WBC.  Subsequently, the images were reviewed with radiology and it was favored that they were rim enhancing.  One of the lesions was aspirated actually returned fluid (I.e. They were not solid) and cytology without malignant cells.    She was evaluated by Infectious disease, and planned for outpatient antibiotics with prolonged course Augmentin.  She will arranged follow up with infectious disease in 1 month, will plan on repeat imaging and tailoring duration of antibiotics.    Pancreatic cancer, metastatic Malignant ascites  Acute kidney injury Creatinine doubled to 1.4 at admission.  Normalized with fluids, stable at baseline.  Severe protein calorie malnutrition  Normocytic anemia Hgb stable, no clinicaly bleeding.   LIkely from chronic disease, chemo.          Discharge Instructions  Discharge Instructions    Diet general   Complete by:  As directed    Discharge instructions   Complete by:  As directed    From Dr. Loleta Books: You were admitted for fever and chills.   Based on our testing, Dr. Benay Spice, Dr. Johnnye Sima and I believe your symptoms were most likely due to an infection or "liver abscess".   Your biopsy showed no abnormal cells to suggest the new spots on your liver are progressions of your cancer. Instead, all your symptoms (and your symptoms for the last year) suggest that these spots are most likely abscesses that have not been treated.  Liver abscesses take a long time to treat. You are taking "Augmentin" for them, which is an antibiotic. Take Augmentin 875-125 mg twice daily I have written for a month's worth with one refill, but the infectious disease specialist will tell you when to stop the medicine, and will need to write a refill if you need it for longer than 2 months. You will need labs drawn at least monthly while you are on Augmentin, I have asked Dr. Gearldine Shown office to draw these. Before you came to the hospital, you were taking levofloxacin/Levaquin daily.  Stop taking this now that you are on Augmentin. While you are taking Augmentin, avoid taking any NSAIDs for pain or fever, including Aleve, naproxen, Mobic, meloxicam, ibuprofen, Advil or Motrin.  I have sent a message to Dr. Gearldine Shown office to call you for a follow up appopintment.  YOu should see his office in the next 2-3 weeks at least.  Schedule an appointment with  Dr. Algis Downs office (Infectious disease) in 4 weeks.  [phone]  Drink a Boost nutritional supplement 1-2 times daily.   Increase activity slowly   Complete by:  As directed      Allergies as of 06/24/2018      Reactions   Pork-derived Products    RELIGIOUS BELIEF -  NO PORK      Medication List    STOP taking these medications    levofloxacin 500 MG tablet Commonly known as:  LEVAQUIN   methylPREDNISolone 4 MG Tbpk tablet Commonly known as:  MEDROL DOSEPAK   naproxen sodium 220 MG tablet Commonly known as:  ALEVE     TAKE these medications   amoxicillin-clavulanate 875-125 MG tablet Commonly known as:  AUGMENTIN Take 1 tablet by mouth 2 (two) times daily.   ferrous sulfate 325 (65 FE) MG EC tablet Take 1 tablet (325 mg total) by mouth 3 (three) times daily with meals. What changed:  when to take this   furosemide 40 MG tablet Commonly known as:  LASIX Take 1 tablet (40 mg total) by mouth daily.   hydrocortisone cream 1 % Apply 1 application topically 3 (three) times daily as needed for itching (minor skin irritation).   HYDROmorphone 4 MG tablet Commonly known as:  DILAUDID Take 1-2 tablets (4-8 mg total) by mouth every 4 (four) hours as needed for severe pain. What changed:  reasons to take this   lactose free nutrition Liqd Take 237 mLs by mouth 3 (three) times daily with meals.   lidocaine-prilocaine cream Commonly known as:  EMLA Apply to port site one hour prior to use. Do not rub in. Cover with plastic. What changed:    how much to take  how to take this  when to take this  reasons to take this   LORazepam 0.5 MG tablet Commonly known as:  ATIVAN Take 1 tablet (0.5 mg total) by mouth every 8 (eight) hours as needed for anxiety (or nausea).   magnesium citrate Soln Take 0.75 Bottles by mouth once.   morphine 15 MG 12 hr tablet Commonly known as:  MS CONTIN Take 1 tablet (15 mg total) by mouth every 12 (twelve) hours. Notes to patient:  Every 12 hours    predniSONE 5 MG tablet Commonly known as:  DELTASONE Take 1 tablet (5 mg total) by mouth 2 (two) times daily. What changed:  when to take this   spironolactone 100 MG tablet Commonly known as:  ALDACTONE Take 1 tablet (100 mg total) by mouth daily.       Allergies  Allergen Reactions  . Pork-Derived Products      RELIGIOUS BELIEF -  NO PORK    Consultations:  Oncology  Infectious disease   Procedures/Studies: Dg Chest 2 View  Result Date: 06/16/2018 CLINICAL DATA:  Feeling unwell for 3 days.  On chemotherapy. EXAM: CHEST - 2 VIEW COMPARISON:  11/11/2017 FINDINGS: Porta catheter on the right with tip at the upper cavoatrial junction. There is no edema, consolidation, effusion, or pneumothorax. Normal heart size and mediastinal contours. IMPRESSION: No evidence of active disease in the chest. Electronically Signed   By: Monte Fantasia M.D.   On: 06/16/2018 18:13   US Guided Needle Placement  Result Date: 06/22/2018 INDICATION: Ascites.  Cystic liver lesions. EXAM: ULTRASOUND GUIDED PARACENTESIS, ULTRASOUND-GUIDED LIVER CYST ASPIRATION MEDICATIONS: None. COMPLICATIONS: None immediate. PROCEDURE: Informed written consent was obtained from the patient after a discussion of the risks, benefits and alternatives to treatment. A timeout  was performed prior to the initiation of the procedure. Initial ultrasound scanning demonstrates a large amount of ascites within the right lower abdominal quadrant. The right lower abdomen was prepped and draped in the usual sterile fashion. 1% lidocaine with epinephrine was used for local anesthesia. Following this, a 6 Fr Safe-T-Centesis catheter was introduced. An ultrasound image was saved for documentation purposes. The paracentesis was performed. The catheter was removed and a dressing was applied. The patient tolerated the procedure well without immediate post procedural complication. Subsequently, under sonographic guidance, a 21 gauge needle was inserted into 1 of the liver cysts and 5 cc clear yellow fluid was aspirated. A sample was sent for culture and a additional sample was sent for cytology. FINDINGS: A total of approximately 2.2 L of clear yellow fluid was removed. Imaging documents needle placement in 1 of the liver cysts. IMPRESSION: Successful ultrasound-guided  paracentesis yielding 2.2 L liters of peritoneal fluid. Successful ultrasound-guided liver cyst aspiration for cytology and culture. Electronically Signed   By: Marybelle Killings M.D.   On: 06/22/2018 13:36   Ct Abdomen Pelvis W Contrast  Result Date: 06/16/2018 CLINICAL DATA:  History of pancreatic cancer.  Weakness. EXAM: CT ABDOMEN AND PELVIS WITH CONTRAST TECHNIQUE: Multidetector CT imaging of the abdomen and pelvis was performed using the standard protocol following bolus administration of intravenous contrast. CONTRAST:  65mL OMNIPAQUE IOHEXOL 300 MG/ML SOLN orally, 75mL OMNIPAQUE IOHEXOL 300 MG/ML SOLN intravenously. COMPARISON:  CT scan of May 04, 2018. FINDINGS: Lower chest: No acute abnormality. Hepatobiliary: Multiple low densities are noted in the right hepatic lobe which are enlarged compared to prior exam consistent with metastatic disease. The largest measures 3.0 x 2.3 cm in dome of right hepatic lobe. No gallstones are noted. Stable appearance of biliary stent extending from common hepatic duct into duodenum. Pancreas: Grossly stable ill-defined mass is noted measuring 6.8 x 3.5 cm which extends into retroperitoneal soft tissues. There remains atrophy of the pancreatic body and tail with associated pancreatic ductal dilatation. Spleen: Maximum measured diameter of 17.5 cm which is unchanged compared to prior exam. No focal abnormality is noted. Adrenals/Urinary Tract: Adrenal glands appear normal. Mild left hydronephrosis is noted without obstructing calculus. Mild urinary bladder distention is noted. No renal or ureteral calculi are noted. Stomach/Bowel: The stomach is unremarkable. Wall thickening of the duodenum and proximal jejunum is noted concerning for inflammation or edema. Stool is noted throughout the colon. No abnormal bowel dilatation is noted. The appendix is not visualized. Vascular/Lymphatic: Abdominal aorta is unremarkable. 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 Reproductive: Uterus and bilateral adnexa are unremarkable. Other: No abdominal wall hernia or abnormality. No abdominopelvic ascites. Musculoskeletal: No acute or significant osseous findings. IMPRESSION: Grossly stable ill-defined mass is seen arising from the pancreas measuring 6.8 x 3.5 cm, which extends into surrounding retroperitoneal soft tissues. Multiple low densities are noted in the right hepatic lobe which are increased in size compared to prior exam and consistent with worsening metastatic disease. Biliary stent is again noted. Continued wall thickening of duodenum and proximal jejunum is noted concerning for inflammation or edema. 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 Stable moderate splenomegaly. Mild left hydronephrosis is noted without obstructing calculus; potentially there may be ureteral occlusion secondary to retroperitoneal neoplasm. Electronically Signed   By: Marijo Conception, M.D.   On: 06/16/2018 21:32   Mr 3d Recon At Scanner  Result Date: 06/19/2018 CLINICAL DATA:  Pancreatic  cancer. New cystic lesions in the liver. Fever and white count. Mildly elevated bilirubin several days ago EXAM: MRI ABDOMEN WITHOUT AND WITH CONTRAST (INCLUDING MRCP) TECHNIQUE: Multiplanar multisequence MR imaging of the abdomen was performed both before and after the administration of intravenous contrast. Heavily T2-weighted images of the biliary and pancreatic ducts were obtained, and three-dimensional MRCP images were rendered by post processing. CONTRAST:  None administered COMPARISON:  7.5 mL Gadavist FINDINGS: Lower chest:  Lung bases are clear. Hepatobiliary: Marked progression round cystic lesions primarily in the central LEFT hepatic lobes but also within the posterolateral RIGHT hepatic lobe and relatively sparing the LEFT lateral hepatic lobe. These lesions are rounded and have high signal intensity on T2 weighted imaging  consistent with cysts. There is subtle thin rim enhancement of these cysts noted on the subtracted imaging (image 11201). Lesions are immediately associated the biliary tree (image 77 of series 600) however no direct communication identified. Lesions do not restrict diffusion. There is minimal biliary duct dilatation in the LEFT hepatic lobe. Pancreas: Pancreatic mass surrounding the biliary stent not well depicted better evaluated by Pearson CT Spleen: Spleen is enlarged Adrenals/urinary tract: Adrenal glands poorly evaluated. Kidneys normal Stomach/Bowel: Stomach and limited of the small bowel is unremarkable Vascular/Lymphatic: Abdominal aortic normal caliber. No retroperitoneal periportal lymphadenopathy. Musculoskeletal: No aggressive osseous lesion IMPRESSION: 1. Multiple round cystic lesions throughout the LEFT RIGHT hepatic lobe advanced from CT 05/04/2018. Differential would include hepatic abscesses versus pancreatic metastasis versus biliary cystic process. Lesions appear to regular for abscesses. Favor pancreatic cystic metastatic disease versus cystic biliary process. FAVOR PANCREATIC METASTASIS in light of the elevated CA 19 9 and rapid advancement. 2. Moderate ascites. 3. Biliary stent in place with mild duct dilatation LEFT hepatic lobe. 4. Pancreatic mass not well evaluated.  See  comparison CT Electronically Signed   By: Suzy Bouchard M.D.   On: 06/19/2018 16:20   US Abdomen Limited  Result Date: 06/18/2018 CLINICAL DATA:  Pancreatic carcinoma and ascites. Prior paracentesis procedures with the most recent on 05/26/2018. EXAM: LIMITED ABDOMEN ULTRASOUND FOR ASCITES TECHNIQUE: Limited ultrasound survey for ascites was performed in all four abdominal quadrants. COMPARISON:  CT on 06/16/2018 FINDINGS: There is only a small volume of fluid in the peritoneal cavity, primarily around the liver. There was not enough fluid to warrant paracentesis. IMPRESSION: Small volume of ascites.  Paracentesis  was not performed today. Electronically Signed   By: Aletta Edouard M.D.   On: 06/18/2018 16:33   US Paracentesis  Result Date: 06/22/2018 INDICATION: Ascites.  Cystic liver lesions. EXAM: ULTRASOUND GUIDED PARACENTESIS, ULTRASOUND-GUIDED LIVER CYST ASPIRATION MEDICATIONS: None. COMPLICATIONS: None immediate. PROCEDURE: Informed written consent was obtained from the patient after a discussion of the risks, benefits and alternatives to treatment. A timeout was performed prior to the initiation of the procedure. Initial ultrasound scanning demonstrates a large amount of ascites within the right lower abdominal quadrant. The right lower abdomen was prepped and draped in the usual sterile fashion. 1% lidocaine with epinephrine was used for local anesthesia. Following this, a 6 Fr Safe-T-Centesis catheter was introduced. An ultrasound image was saved for documentation purposes. The paracentesis was performed. The catheter was removed and a dressing was applied. The patient tolerated the procedure well without immediate post procedural complication. Subsequently, under sonographic guidance, a 21 gauge needle was inserted into 1 of the liver cysts and 5 cc clear yellow fluid was aspirated. A sample was sent for culture and a additional sample was sent for cytology. FINDINGS:  A total of approximately 2.2 L of clear yellow fluid was removed. Imaging documents needle placement in 1 of the liver cysts. IMPRESSION: Successful ultrasound-guided paracentesis yielding 2.2 L liters of peritoneal fluid. Successful ultrasound-guided liver cyst aspiration for cytology and culture. Electronically Signed   By: Marybelle Killings M.D.   On: 06/22/2018 13:36   US Paracentesis  Result Date: 05/26/2018 INDICATION: Pancreatic cancer. Recurrent ascites. Request for therapeutic paracentesis. EXAM: ULTRASOUND GUIDED PARACENTESIS MEDICATIONS: 1% lidocaine 10 mL COMPLICATIONS: None immediate. PROCEDURE: Informed written consent was obtained  from the patient after a discussion of the risks, benefits and alternatives to treatment. A timeout was performed prior to the initiation of the procedure. Initial ultrasound scanning demonstrates a large amount of ascites within the right lower abdominal quadrant. The right lower abdomen was prepped and draped in the usual sterile fashion. 1% lidocaine with epinephrine was used for local anesthesia. Following this, a 19 gauge, 7-cm, Yueh catheter was introduced. An ultrasound image was saved for documentation purposes. The paracentesis was performed. The catheter was removed and a dressing was applied. The patient tolerated the procedure well without immediate post procedural complication. FINDINGS: A total of approximately 5.5 L of clear yellow fluid was removed. IMPRESSION: Successful ultrasound-guided paracentesis yielding 5.4 liters of peritoneal fluid. Read by: Gareth Eagle, PA-C Electronically Signed   By: Sandi Mariscal M.D.   On: 05/26/2018 14:59   Mr Abdomen Mrcp Moise Boring Contast  Result Date: 06/19/2018 CLINICAL DATA:  Pancreatic cancer. New cystic lesions in the liver. Fever and white count. Mildly elevated bilirubin several days ago EXAM: MRI ABDOMEN WITHOUT AND WITH CONTRAST (INCLUDING MRCP) TECHNIQUE: Multiplanar multisequence MR imaging of the abdomen was performed both before and after the administration of intravenous contrast. Heavily T2-weighted images of the biliary and pancreatic ducts were obtained, and three-dimensional MRCP images were rendered by post processing. CONTRAST:  None administered COMPARISON:  7.5 mL Gadavist FINDINGS: Lower chest:  Lung bases are clear. Hepatobiliary: Marked progression round cystic lesions primarily in the central LEFT hepatic lobes but also within the posterolateral RIGHT hepatic lobe and relatively sparing the LEFT lateral hepatic lobe. These lesions are rounded and have high signal intensity on T2 weighted imaging consistent with cysts. There is subtle thin rim  enhancement of these cysts noted on the subtracted imaging (image 11201). Lesions are immediately associated the biliary tree (image 77 of series 600) however no direct communication identified. Lesions do not restrict diffusion. There is minimal biliary duct dilatation in the LEFT hepatic lobe. Pancreas: Pancreatic mass surrounding the biliary stent not well depicted better evaluated by Pearson CT Spleen: Spleen is enlarged Adrenals/urinary tract: Adrenal glands poorly evaluated. Kidneys normal Stomach/Bowel: Stomach and limited of the small bowel is unremarkable Vascular/Lymphatic: Abdominal aortic normal caliber. No retroperitoneal periportal lymphadenopathy. Musculoskeletal: No aggressive osseous lesion IMPRESSION: 1. Multiple round cystic lesions throughout the LEFT RIGHT hepatic lobe advanced from CT 05/04/2018. Differential would include hepatic abscesses versus pancreatic metastasis versus biliary cystic process. Lesions appear to regular for abscesses. Favor pancreatic cystic metastatic disease versus cystic biliary process. FAVOR PANCREATIC METASTASIS in light of the elevated CA 19 9 and rapid advancement. 2. Moderate ascites. 3. Biliary stent in place with mild duct dilatation LEFT hepatic lobe. 4. Pancreatic mass not well evaluated.  See  comparison CT Electronically Signed   By: Suzy Bouchard M.D.   On: 06/19/2018 16:20       Subjective: Feeling well.  No dyspnea, chest pain, abdominal swelling, confusion, weakness, vomiting, fever.  Discharge Exam: Vitals:   06/23/18 1947 06/24/18 0549  BP: (!) 105/92 101/82  Pulse: 77 68  Resp: 19 18  Temp: 98 F (36.7 C) 97.9 F (36.6 C)  SpO2: 100% 100%   Vitals:   06/22/18 2042 06/23/18 0607 06/23/18 1947 06/24/18 0549  BP: 107/86 100/78 (!) 105/92 101/82  Pulse: 87 82 77 68  Resp: 18 18 19 18   Temp: 98.6 F (37 C) 98.7 F (37.1 C) 98 F (36.7 C) 97.9 F (36.6 C)  TempSrc: Oral Oral Oral Oral  SpO2: 99% 100% 100% 100%  Weight:     53.6 kg    General: Pt is alert, awake, not in acute distress, sitting up in bed.  Appears chronically ill and wasted. Cardiovascular: RRR, nl S1-S2, no murmurs appreciated.   No LE edema.   Respiratory: Normal respiratory rate and rhythm.  CTAB without rales or wheezes. Abdominal: Abdomen soft and non-tender.  No distension or HSM.   Neuro/Psych: Strength symmetric in upper and lower extremities.  Judgment and insight appear normal.   The results of significant diagnostics from this hospitalization (including imaging, microbiology, ancillary and laboratory) are listed below for reference.     Microbiology: Recent Results (from the past 240 hour(s))  Blood culture (routine x 2)     Status: None   Collection Time: 06/16/18  5:24 PM  Result Value Ref Range Status   Specimen Description   Final    BLOOD RIGHT ANTECUBITAL Performed at [facility] [address], [place], [place] [address]    Special Requests   Final    BOTTLES DRAWN AEROBIC AND ANAEROBIC Blood Culture adequate volume   Culture   Final    NO GROWTH 5 DAYS Performed at [facility], [place] [address] [place], [place] [address]    Report Status 06/21/2018 FINAL  Final  Blood culture (routine x 2)     Status: None   Collection Time: 06/16/18  5:24 PM  Result Value Ref Range Status   Specimen Description   Final    BLOOD PORTA CATH Performed at [facility] [address] [place], [place] [address]    Special Requests   Final    BOTTLES DRAWN AEROBIC AND ANAEROBIC Blood Culture adequate volume   Culture   Final    NO GROWTH 5 DAYS Performed at [facility], [place] [address] [place], [place] [address]    Report Status 06/21/2018 FINAL  Final  Urine culture     Status: None   Collection Time: 06/16/18  5:24 PM  Result Value Ref Range Status   Specimen Description   Final    URINE, RANDOM Performed at [facility] [address]  [place], [place] [address]    Special Requests   Final    NONE Performed at [facility], [place] [address], [place], [place] [address]    Culture   Final    NO GROWTH Performed at [facility], [place] [address], [place], [place] [address]    Report Status 06/18/2018 FINAL  Final  Aerobic/Anaerobic Culture (surgical/deep wound)     Status: None (Preliminary result)   Collection Time: 06/22/18  1:04 PM  Result Value Ref Range Status   Specimen Description   Final    LIVER Performed at [facility], [place] [address], [place],  [address]    Special Requests   Final    NONE Performed at  [facility], [place] [address] [place], [place] [address]    Gram Stain NO WBC SEEN NO ORGANISMS SEEN   Final   Culture   Final    NO GROWTH 2 DAYS NO ANAEROBES ISOLATED; CULTURE IN PROGRESS FOR 5 DAYS Performed at [facility] [address] [place], [place] [address]    Report Status PENDING  Incomplete     Labs: BNP (last 3 results) No results for input(s): BNP in the last 8760 hours. Basic Metabolic Panel: Recent Labs  Lab 06/18/18 1042 06/19/18 0531 06/20/18 0547 06/21/18 0537 06/23/18 0320  NA 134* 137 138 138 137  K 4.4 4.8 4.3 4.1 4.1  CL 109 112* 113* 112* 108  CO2 19* 21* 20* 19* 23  GLUCOSE 237* 147* 106* 119* 159*  BUN 30* 24* 21* 20 23*  CREATININE 0.91 0.76 0.75 0.70 0.72  CALCIUM 8.7* 8.3* 8.3* 8.3* 8.1*   Liver Function Tests: No results for input(s): AST, ALT, ALKPHOS, BILITOT, PROT, ALBUMIN in the last 168 hours. No results for input(s): LIPASE, AMYLASE in the last 168 hours. No results for input(s): AMMONIA in the last 168 hours. CBC: Recent Labs  Lab 06/18/18 1042 06/20/18 0547 06/21/18 0537 06/22/18 0519 06/23/18 0320 06/24/18 0830  WBC 19.1* 10.3 12.8* 13.0* 17.8* 15.1*  NEUTROABS 16.2*  --   --   --   --   --   HGB 8.4* 7.8* 8.3* 7.5* 8.5* 8.2*  HCT 28.2* 26.4* 29.0* 24.9* 28.0* 27.1*   MCV 80.3 80.5 82.4 81.1 81.2 81.1  PLT 198 158 154 137* 153 160   Cardiac Enzymes: No results for input(s): CKTOTAL, CKMB, CKMBINDEX, TROPONINI in the last 168 hours. BNP: Invalid input(s): POCBNP CBG: Recent Labs  Lab 06/20/18 0729 06/21/18 0735 06/22/18 0726 06/23/18 0728 06/24/18 0733  GLUCAP 107* 94 95 90 207*   D-Dimer No results for input(s): DDIMER in the last 72 hours. Hgb A1c No results for input(s): HGBA1C in the last 72 hours. Lipid Profile No results for input(s): CHOL, HDL, LDLCALC, TRIG, CHOLHDL, LDLDIRECT in the last 72 hours. Thyroid function studies No results for input(s): TSH, T4TOTAL, T3FREE, THYROIDAB in the last 72 hours.  Invalid input(s): FREET3 Anemia work up No results for input(s): VITAMINB12, FOLATE, FERRITIN, TIBC, IRON, RETICCTPCT in the last 72 hours. Urinalysis    Component Value Date/Time   COLORURINE YELLOW 06/16/2018 1706   APPEARANCEUR CLEAR 06/16/2018 1706   LABSPEC 1.008 06/16/2018 1706   LABSPEC 1.020 07/24/2017 0847   PHURINE 6.0 06/16/2018 1706   GLUCOSEU NEGATIVE 06/16/2018 1706   GLUCOSEU Negative 07/24/2017 0847   HGBUR NEGATIVE 06/16/2018 1706   BILIRUBINUR NEGATIVE 06/16/2018 1706   BILIRUBINUR Negative 07/24/2017 0847   KETONESUR NEGATIVE 06/16/2018 1706   PROTEINUR NEGATIVE 06/16/2018 1706   UROBILINOGEN 0.2 07/24/2017 0847   NITRITE NEGATIVE 06/16/2018 1706   LEUKOCYTESUR NEGATIVE 06/16/2018 1706   LEUKOCYTESUR Negative 07/24/2017 0847   Sepsis Labs Invalid input(s): PROCALCITONIN,  WBC,  LACTICIDVEN Microbiology Recent Results (from the past 240 hour(s))  Blood culture (routine x 2)     Status: None   Collection Time: 06/16/18  5:24 PM  Result Value Ref Range Status   Specimen Description   Final    BLOOD RIGHT ANTECUBITAL Performed at [facility], [place] [address] [place],  [address]    Special Requests   Final    BOTTLES DRAWN AEROBIC AND ANAEROBIC Blood Culture adequate volume    Culture  Final    NO GROWTH 5 DAYS Performed at [facility], [place] [address], [place], [place] [address]    Report Status 06/21/2018 FINAL  Final  Blood culture (routine x 2)     Status: None   Collection Time: 06/16/18  5:24 PM  Result Value Ref Range Status   Specimen Description   Final    BLOOD PORTA CATH Performed at [place] [address], [place], [place] [address]    Special Requests   Final    BOTTLES DRAWN AEROBIC AND ANAEROBIC Blood Culture adequate volume   Culture   Final    NO GROWTH 5 DAYS Performed at [facility], [place] [address], [place], [place] [address]    Report Status 06/21/2018 FINAL  Final  Urine culture     Status: None   Collection Time: 06/16/18  5:24 PM  Result Value Ref Range Status   Specimen Description   Final    URINE, RANDOM Performed at [facility] [address] [place], [place] [address]    Special Requests   Final    NONE Performed at [facility], [place] [address], [place], [place] [address]    Culture   Final    NO GROWTH Performed at [facility], [place] [address] [place], [place] [address]    Report Status 06/18/2018 FINAL  Final  Aerobic/Anaerobic Culture (surgical/deep wound)     Status: None (Preliminary result)   Collection Time: 06/22/18  1:04 PM  Result Value Ref Range Status   Specimen Description   Final    LIVER Performed at [facility], [place] [address], [place], [place] [address]    Special Requests   Final    NONE Performed at [facility], [place] [address] [place] [address] [place], [place] [address]    Gram Stain NO WBC SEEN NO ORGANISMS SEEN   Final   Culture   Final    NO GROWTH 2 DAYS NO ANAEROBES ISOLATED; CULTURE IN PROGRESS FOR 5 DAYS Performed at [place] [address] [place], [place] [address]    Report Status PENDING  Incomplete     Time coordinating discharge: 40 minutes        SIGNED:   Edwin Dada, MD  [facility] 06/24/2018, 4:23 PM

## 2018-06-24 NOTE — Progress Notes (Signed)
[facility] Infusion Coordinator communicating with Mina Marble, ID RPh regarding Unasyn shortage.  Rachel Bo will work on home regimen with Dr. Johnnye Sima- likely PO and not IV.  [facility] is available to support any infusion or HH needs at DC s ordered. Velva Harman, RN CM updated.   If patient discharges after hours, please call [phone].   Larry Sierras 06/24/2018, 9:11 AM

## 2018-06-24 NOTE — Telephone Encounter (Signed)
Scheduled appt per 11/7 sch message - patient husband is aware of appt date and time.

## 2018-06-25 ENCOUNTER — Telehealth: Payer: Self-pay | Admitting: *Deleted

## 2018-06-25 DIAGNOSIS — C259 Malignant neoplasm of pancreas, unspecified: Secondary | ICD-10-CM

## 2018-06-25 DIAGNOSIS — C787 Secondary malignant neoplasm of liver and intrahepatic bile duct: Secondary | ICD-10-CM

## 2018-06-25 DIAGNOSIS — G893 Neoplasm related pain (acute) (chronic): Secondary | ICD-10-CM

## 2018-06-25 MED ORDER — HYDROMORPHONE HCL 4 MG PO TABS: 4.0000 mg | ORAL_TABLET | ORAL | 0 refills | Status: DC | PRN

## 2018-06-25 MED FILL — HYDROmorphone HCL 4 MG TABS: 4 | 3 days supply | Qty: 30 | Fill #0

## 2018-06-25 NOTE — Telephone Encounter (Signed)
Husband called to inquire if patient was supposed to continue the MS Contin? Also needs refill on her dilaudid. Per Dr. Benay Spice: Continue MS Contin and OK to refill hydromorphone.

## 2018-06-25 NOTE — Telephone Encounter (Signed)
Left VM for husband to continue MS Contin every 12 hours and pick up hydromorphone script at 2:30 pm today.

## 2018-06-27 LAB — AEROBIC/ANAEROBIC CULTURE W GRAM STAIN (SURGICAL/DEEP WOUND)
Culture: NO GROWTH
Gram Stain: NONE SEEN

## 2018-06-27 LAB — AEROBIC/ANAEROBIC CULTURE (SURGICAL/DEEP WOUND)

## 2018-06-30 ENCOUNTER — Telehealth: Payer: Self-pay

## 2018-06-30 ENCOUNTER — Inpatient Hospital Stay: Payer: Medicaid Other | Attending: Nurse Practitioner | Admitting: Oncology

## 2018-06-30 DIAGNOSIS — C257 Malignant neoplasm of other parts of pancreas: Secondary | ICD-10-CM | POA: Diagnosis present

## 2018-06-30 DIAGNOSIS — C787 Secondary malignant neoplasm of liver and intrahepatic bile duct: Secondary | ICD-10-CM | POA: Diagnosis not present

## 2018-06-30 DIAGNOSIS — Z9221 Personal history of antineoplastic chemotherapy: Secondary | ICD-10-CM | POA: Diagnosis not present

## 2018-06-30 DIAGNOSIS — D509 Iron deficiency anemia, unspecified: Secondary | ICD-10-CM

## 2018-06-30 DIAGNOSIS — Z86718 Personal history of other venous thrombosis and embolism: Secondary | ICD-10-CM | POA: Diagnosis not present

## 2018-06-30 DIAGNOSIS — C259 Malignant neoplasm of pancreas, unspecified: Secondary | ICD-10-CM

## 2018-06-30 MED ORDER — LORAZEPAM 0.5 MG PO TABS: 0.5000 mg | ORAL_TABLET | Freq: Three times a day (TID) | ORAL | 0 refills | Status: AC | PRN

## 2018-06-30 MED ORDER — PREDNISONE 5 MG PO TABS: 5.0000 mg | ORAL_TABLET | Freq: Every day | ORAL | 1 refills | Status: AC

## 2018-06-30 MED FILL — LORazepam 0.5 MG TABS: 0.5 | 10 days supply | Qty: 30 | Fill #0

## 2018-06-30 MED FILL — predniSONE 5 MG TABS: 5 | 30 days supply | Qty: 30 | Fill #0

## 2018-06-30 MED FILL — FUROSEMIDE 40 MG TAB: 40 | 30 days supply | Qty: 30 | Fill #1

## 2018-06-30 MED FILL — SPIRONOLACTONE 100 MG TAB: 100 | 30 days supply | Qty: 30 | Fill #1

## 2018-06-30 NOTE — Progress Notes (Signed)
Crystal Carlson OFFICE PROGRESS NOTE   Diagnosis: Pancreas cancer  INTERVAL HISTORY:   Crystal Carlson was discharged on 06/24/2018 after an admission with fever and failure to thrive.  Cultures of the blood, urine, and a liver cyst were negative.  She was treated with antibiotics in the hospital and is now maintained on Augmentin.  She denies recurrent fever. She continues to have abdominal pain.  The ascites has reaccumulated partially.  The pain is controlled with MS Contin and Dilaudid.  Objective:  Vital signs in last 24 hours:  Blood pressure 125/88, pulse 63, temperature 98.4 F (36.9 C), temperature source Oral, resp. rate 14, height 5\' 5"  (1.651 m), weight 105 lb 12.8 oz (48 kg), SpO2 100 %.    HEENT: White coat over the tongue, no buccal thrush Resp: Lungs clear bilaterally Cardio: Regular rate and rhythm GI: Mildly distended with ascites, no mass Vascular: No leg edema   Portacath/PICC-without erythema  Lab Results:  Lab Results  Component Value Date   WBC 15.1 (H) 06/24/2018   HGB 8.2 (L) 06/24/2018   HCT 27.1 (L) 06/24/2018   MCV 81.1 06/24/2018   PLT 160 06/24/2018   NEUTROABS 16.2 (H) 06/18/2018    CMP  Lab Results  Component Value Date   NA 137 06/23/2018   K 4.1 06/23/2018   CL 108 06/23/2018   CO2 23 06/23/2018   GLUCOSE 159 (H) 06/23/2018   BUN 23 (H) 06/23/2018   CREATININE 0.72 06/23/2018   CALCIUM 8.1 (L) 06/23/2018   PROT 7.6 06/16/2018   ALBUMIN 2.3 (L) 06/16/2018   AST 42 (H) 06/16/2018   ALT 29 06/16/2018   ALKPHOS 311 (H) 06/16/2018   BILITOT 1.5 (H) 06/16/2018   GFRNONAA >60 06/23/2018   GFRAA >60 06/23/2018    Lab Results  Component Value Date   CEA1 1.3 05/25/2017     Medications: I have reviewed the patient's current medications.   Assessment/Plan: 1. 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 Adenocarcinoma consistent with pancreatobiliary primary.  Cycle 1 FOLFIRINOX 06/18/2017  Cycle 2FOLFIRINOX 07/02/2017  Cycle 3 FOLFIRINOX 07/16/2017 (oxaliplatin dose reduced due to mild thrombocytopenia)  CT 07/24/2017-new splenomegaly and left hydronephrosis  Cycle 4 FOLFIRINOX 08/08/2017  Cycle 5 FOLFIRINOX 08/24/2017  CT abdomen/pelvis 09/03/2017-stable liver lesions and pancreas mass, stable right biliary dilatation, possible new 8 mm right liver lesion versus a vascular phenomena, lesion at the right paracolic gutter was present in the past-significance unclear  Cycle 6 FOLFIRINOX1/29/2019  Cycle7 FOLFIRINOX2/19/2019 (Irinotecan andOxaliplatin dose reduced due to thrombocytopenia)  CT abdomen/pelvis 10/25/2017- findings concerning for enterocolitis. No bowel obstruction. Ill-defined pancreatic head mass. Multiple hepatic hypoenhancing lesions with the largest measuring 1.9 x 2.0 cm, previously 1.7 x 1.7 cm; 9 x 10 mm hypoenhancing lesion in the inferior right lobe of the liver slightly larger compared to the previous CT; 1.6 x 2.4 cm enhancing lesion in the right paracolic gutter increased in size compared to the prior CT.  Cycle 1 gemcitabine/Abraxane 11/04/2017  Cycle 2 gemcitabine/Abraxane 11/25/2017  Cycle 3 gemcitabine/Abraxane 12/08/2017  Cycle 4 gemcitabine 12/23/2017 (Abraxane held secondary to neuropathy)  CTs abdomen/pelvis 01/12/2018- decrease in size of the  pancreatic mass and hepatic metastasis; slight interval decrease in size of right paracolic gutter implant; worsening abdominal/pelvic ascites. Bowel wall thickening likely due to  ascites and low albumin. 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  Cycle 5 gemcitabine 01/13/2018 (Abraxane held secondary to neuropathy)  Cycle 6 gemcitabine/Abraxane 01/27/2018  CT abdomen/pelvis 02/01/2018- dominant metastases in the liver are unchanged, multiple new low-density lesions throughout the right lobe-small abscesses?, No change in pancreas mass  Cycle 7 gemcitabine/Abraxane 02/16/2018(gemcitabine and Abraxane dose reduced due to previous neutropenia)  Cycle 8 gemcitabine/Abraxane 03/03/2018  Cycle 9 gemcitabine/Abraxane 03/25/2018  Cycle 10 gemcitabine/Abraxane 04/14/2018  Restaging CTs 05/04/2018- similar ill-defined pancreatic mass; multiple liver metastases again noted, appears slightly improved; increase in volume of abdominal and pelvic ascites; diffuse small bowel wall edema and mild edema of the ascending colon and transverse colon.  Cycle 11 gemcitabine/Abraxane 05/05/2018  Cycle 12 gemcitabine/Abraxane 06/02/2018  CT 06/16/2018-stable pancreas mass extending into retroperitoneal soft tissue, multiple low-density lesions in the right liver-increased in size probable superior mesenteric vein thrombosis, mild left hydronephrosis  Ultrasound aspiration of a liver lesion 06/22/2018-negative cytology and culture   2. Pain secondary to #1.Improved.  3. Weight losssecondary to #1.  4. Microcytic anemia  Transfused with packed red blood cells 07/25/2017, 16 2019, and 11/27/2017  5. History of thrombocytopenia secondary to chemotherapy  6. Port-A-Cath placement 06/04/2017  7. Elevated liver enzymes/bilirubin10/22/2018-likely early biliary obstruction secondary to pancreas cancer, status post ERCP-placement of a common bile duct stent 06/09/2018  8.Postprandial abdominal pain. Protonix initiated 07/16/2017.Trial of Reglan initiated 08/24/2017.  9.Admission with fever 07/24/2017-no source for infection identified,  resolved   10.Diarrheafollowing cycle 3 FOLFIRINOX-resolved  11.Left hydronephrosis noted on CT12/03/2017-etiology unclear, urology consulted-stent not recommended.Mild left hydronephrosis noted on CT 10/25/2017. Creatinine normal 10/27/2017.  12. Admission 08/31/2017 with a high fever, blood culture positive for gram-negative rods-Klebsiella pneumoniae identified  13.Fever 10/25/2017 status post evaluation in the emergency department. CT with findings concerning for enterocolitis.   14. Admission 11/11/2017 with a high fever-blood cultures (peripheral and Port-A-Cath) positive for gram-negative rods--STENOTROPHOMONAS MALTOPHILIA;course of Septra completed as an outpatient.  15. Rash-potentially secondary to gemcitabine or infection, improved  16. Pancytopenia secondary to chemotherapy  17.Intermittent fever. Question "tumor fever", question infection. Now on Bactrim prophylaxis, improved  18. Ascites.Paracentesis 01/14/2018- reactive mesothelial cells, mixed inflammation;culture positive for Klebsiella and enterococcustreated withAugmentin  Admission with fever 01/30/2018, blood cultures negative, discharged on ciprofloxacin/Flagyl  Recurrent ascites, paracentesis 02/23/2018-culture positive for strep viridans, placed on Levaquin  Paracentesis 03/03/2018- 2 L of fluid removed, culture and cytology negative  Trialof Aldactone/Lasix started 05/26/2018  19.Status post evaluation back pain in the emergency department 04/23/2018-MRI lumbar spine with L5-S1 left subarticular disc extrusion  L5-S1laminotomyand microdiscectomy 05/17/2018, pain resolved  20.  Admission 06/16/2018 with fever and failure to thrive- cultures negative, suspect liver abscesses    Disposition: She appears unchanged compared to hospital discharge.  She continues Augmentin prophylaxis for probable hepatic abscesses.  The CA 19-9 was higher on 06/02/2018.  I discussed treatment  options with Crystal Carlson and her husband.  The plan is to continue gemcitabine/Abraxane unless there is clear evidence of disease progression.  We will plan for a repeat CT at a 6-8-week interval.  She will be scheduled for another treatment with gemcitabine/Abraxane 07/02/2018.  She will return for office visit and chemotherapy on 07/21/2018.  25 minutes were spent with the patient today.  The majority of the time was used for counseling and coordination of care.  Betsy Coder,  MD  06/30/2018  9:00 AM

## 2018-06-30 NOTE — Telephone Encounter (Signed)
Printed avs and calender of upcoming appointment. Per 1/13 los 

## 2018-07-02 ENCOUNTER — Inpatient Hospital Stay: Payer: Medicaid Other

## 2018-07-02 ENCOUNTER — Telehealth: Payer: Self-pay | Admitting: *Deleted

## 2018-07-02 VITALS — BP 96/64 | HR 72 | Temp 98.6°F | Resp 14

## 2018-07-02 DIAGNOSIS — C257 Malignant neoplasm of other parts of pancreas: Secondary | ICD-10-CM | POA: Diagnosis not present

## 2018-07-02 DIAGNOSIS — C259 Malignant neoplasm of pancreas, unspecified: Secondary | ICD-10-CM

## 2018-07-02 DIAGNOSIS — C787 Secondary malignant neoplasm of liver and intrahepatic bile duct: Principal | ICD-10-CM

## 2018-07-02 LAB — CBC WITH DIFFERENTIAL (CANCER CENTER ONLY)
ABS IMMATURE GRANULOCYTES: 0.07 10*3/uL (ref 0.00–0.07)
BASOS PCT: 0 %
Basophils Absolute: 0 10*3/uL (ref 0.0–0.1)
Eosinophils Absolute: 0.1 10*3/uL (ref 0.0–0.5)
Eosinophils Relative: 1 %
HCT: 29.6 % — ABNORMAL LOW (ref 36.0–46.0)
Hemoglobin: 9.3 g/dL — ABNORMAL LOW (ref 12.0–15.0)
IMMATURE GRANULOCYTES: 1 %
Lymphocytes Relative: 5 %
Lymphs Abs: 0.7 10*3/uL (ref 0.7–4.0)
MCH: 25.6 pg — AB (ref 26.0–34.0)
MCHC: 31.4 g/dL (ref 30.0–36.0)
MCV: 81.5 fL (ref 80.0–100.0)
Monocytes Absolute: 1.1 10*3/uL — ABNORMAL HIGH (ref 0.1–1.0)
Monocytes Relative: 8 %
NEUTROS ABS: 11.2 10*3/uL — AB (ref 1.7–7.7)
NEUTROS PCT: 85 %
PLATELETS: 167 10*3/uL (ref 150–400)
RBC: 3.63 MIL/uL — AB (ref 3.87–5.11)
RDW: 26.5 % — ABNORMAL HIGH (ref 11.5–15.5)
WBC: 13.1 10*3/uL — AB (ref 4.0–10.5)
nRBC: 0 % (ref 0.0–0.2)

## 2018-07-02 LAB — CMP (CANCER CENTER ONLY)
ALBUMIN: 2.1 g/dL — AB (ref 3.5–5.0)
ALT: 277 U/L (ref 0–44)
ANION GAP: 9 (ref 5–15)
AST: 254 U/L (ref 15–41)
Alkaline Phosphatase: 375 U/L — ABNORMAL HIGH (ref 38–126)
BILIRUBIN TOTAL: 6 mg/dL — AB (ref 0.3–1.2)
BUN: 21 mg/dL — ABNORMAL HIGH (ref 6–20)
CO2: 27 mmol/L (ref 22–32)
Calcium: 9.1 mg/dL (ref 8.9–10.3)
Chloride: 96 mmol/L — ABNORMAL LOW (ref 98–111)
Creatinine: 0.79 mg/dL (ref 0.44–1.00)
GFR, Est AFR Am: >60 mL/min (ref >60)
Glucose, Bld: 183 mg/dL — ABNORMAL HIGH (ref 70–99)
POTASSIUM: 4.6 mmol/L (ref 3.5–5.1)
Sodium: 132 mmol/L — ABNORMAL LOW (ref 135–145)
TOTAL PROTEIN: 7.6 g/dL (ref 6.5–8.1)

## 2018-07-02 MED ORDER — SODIUM CHLORIDE 0.9% FLUSH
10.0000 mL | INTRAVENOUS | Status: DC | PRN
  Administered 2018-07-02: 10 mL
  Filled 2018-07-02: qty 10

## 2018-07-02 MED ORDER — HEPARIN SOD (PORK) LOCK FLUSH 100 UNIT/ML IV SOLN
500.0000 [IU] | Freq: Once | INTRAVENOUS | Status: AC | PRN
  Administered 2018-07-02: 500 [IU]
  Filled 2018-07-02: qty 5

## 2018-07-02 MED ORDER — SODIUM CHLORIDE 0.9 % IV SOLN
Freq: Once | INTRAVENOUS | Status: DC
  Filled 2018-07-02: qty 250

## 2018-07-02 NOTE — Progress Notes (Signed)
NO treatment today, due to elevated Bili of 6.0. Repeat of 5.5, per Dr. Benay Spice.

## 2018-07-02 NOTE — Progress Notes (Signed)
Flush Completed under infusion encounter.

## 2018-07-02 NOTE — Patient Instructions (Signed)
Crystal Carlson Discharge Instructions for Patients Receiving Chemotherapy  Today you received the following chemotherapy agents Abraxan, Genzar.  To help prevent nausea and vomiting after your treatment, we encourage you to take your nausea medication.   If you develop nausea and vomiting that is not controlled by your nausea medication, call the clinic.   BELOW ARE SYMPTOMS THAT SHOULD BE REPORTED IMMEDIATELY:  *FEVER GREATER THAN 100.5 F  *CHILLS WITH OR WITHOUT FEVER  NAUSEA AND VOMITING THAT IS NOT CONTROLLED WITH YOUR NAUSEA MEDICATION  *UNUSUAL SHORTNESS OF BREATH  *UNUSUAL BRUISING OR BLEEDING  TENDERNESS IN MOUTH AND THROAT WITH OR WITHOUT PRESENCE OF ULCERS  *URINARY PROBLEMS  *BOWEL PROBLEMS  UNUSUAL RASH Items with * indicate a potential emergency and should be followed up as soon as possible.  Feel free to call the clinic should you have any questions or concerns. The clinic phone number is [phone].  Please show the [facility] at check-in to the Emergency Department and triage nurse.

## 2018-07-02 NOTE — Telephone Encounter (Signed)
Notified patient/husband in infusion area that liver functions are too elevated to treat. Dr. Benay Spice suspects there is a blockage in bile duct sent. He will call Dr. Watt Climes. Will not treat today. Notified husband that Dr. Benay Spice spoke with Dr. Watt Climes. GI office will call to schedule him to exchange her stent next week. Will take 1-2 weeks for bili to lower to safe level to treat. Keep scheduled chemo for 07/21/18. He will call if he has not heard from GI by Tuesday.

## 2018-07-03 LAB — CANCER ANTIGEN 19-9: CA 19-9: 9907 U/mL — ABNORMAL HIGH (ref 0–35)

## 2018-07-05 ENCOUNTER — Ambulatory Visit (HOSPITAL_COMMUNITY): Payer: Medicaid Other | Admitting: Anesthesiology

## 2018-07-05 ENCOUNTER — Other Ambulatory Visit: Payer: Self-pay

## 2018-07-05 ENCOUNTER — Ambulatory Visit (HOSPITAL_COMMUNITY): Payer: Medicaid Other

## 2018-07-05 ENCOUNTER — Ambulatory Visit (HOSPITAL_COMMUNITY)
Admission: RE | Admit: 2018-07-05 | Discharge: 2018-07-05 | Disposition: A | Discharge status: Home / Self Care | Payer: Medicaid Other | Source: Ambulatory Visit | Attending: Gastroenterology | Admitting: Gastroenterology

## 2018-07-05 ENCOUNTER — Encounter (HOSPITAL_COMMUNITY)
Admission: RE | Disposition: A | Discharge status: Home / Self Care | Payer: Self-pay | Source: Ambulatory Visit | Attending: Gastroenterology

## 2018-07-05 ENCOUNTER — Encounter (HOSPITAL_COMMUNITY): Payer: Self-pay | Admitting: Anesthesiology

## 2018-07-05 DIAGNOSIS — Y732 Prosthetic and other implants, materials and accessory gastroenterology and urology devices associated with adverse incidents: Secondary | ICD-10-CM | POA: Insufficient documentation

## 2018-07-05 DIAGNOSIS — F1120 Opioid dependence, uncomplicated: Secondary | ICD-10-CM | POA: Diagnosis not present

## 2018-07-05 DIAGNOSIS — R17 Unspecified jaundice: Secondary | ICD-10-CM | POA: Diagnosis not present

## 2018-07-05 DIAGNOSIS — C250 Malignant neoplasm of head of pancreas: Secondary | ICD-10-CM | POA: Insufficient documentation

## 2018-07-05 DIAGNOSIS — K831 Obstruction of bile duct: Secondary | ICD-10-CM

## 2018-07-05 DIAGNOSIS — Z79899 Other long term (current) drug therapy: Secondary | ICD-10-CM | POA: Diagnosis not present

## 2018-07-05 DIAGNOSIS — F418 Other specified anxiety disorders: Secondary | ICD-10-CM | POA: Insufficient documentation

## 2018-07-05 DIAGNOSIS — K861 Other chronic pancreatitis: Secondary | ICD-10-CM | POA: Diagnosis not present

## 2018-07-05 DIAGNOSIS — R609 Edema, unspecified: Secondary | ICD-10-CM | POA: Diagnosis not present

## 2018-07-05 DIAGNOSIS — I10 Essential (primary) hypertension: Secondary | ICD-10-CM | POA: Insufficient documentation

## 2018-07-05 DIAGNOSIS — T85520A Displacement of bile duct prosthesis, initial encounter: Secondary | ICD-10-CM | POA: Diagnosis not present

## 2018-07-05 HISTORY — PX: SPHINCTEROTOMY: SHX5544

## 2018-07-05 HISTORY — PX: ENDOSCOPIC RETROGRADE CHOLANGIOPANCREATOGRAPHY (ERCP) WITH PROPOFOL: SHX5810

## 2018-07-05 HISTORY — PX: BILIARY STENT PLACEMENT: SHX5538

## 2018-07-05 SURGERY — ENDOSCOPIC RETROGRADE CHOLANGIOPANCREATOGRAPHY (ERCP) WITH PROPOFOL
Anesthesia: General

## 2018-07-05 MED ORDER — IOPAMIDOL (ISOVUE-M 300) INJECTION 61%
INTRAMUSCULAR | Status: DC | PRN
  Administered 2018-07-05: 40 mL via INTRATHECAL

## 2018-07-05 MED ORDER — CIPROFLOXACIN IN D5W 400 MG/200ML IV SOLN
400.0000 mg | Freq: Once | INTRAVENOUS | Status: AC
  Administered 2018-07-05: 400 mg via INTRAVENOUS

## 2018-07-05 MED ORDER — DEXAMETHASONE SODIUM PHOSPHATE 10 MG/ML IJ SOLN
INTRAMUSCULAR | Status: DC | PRN
  Administered 2018-07-05: 10 mg via INTRAVENOUS

## 2018-07-05 MED ORDER — LACTATED RINGERS IV SOLN
INTRAVENOUS | Status: DC
  Administered 2018-07-05: 12:00:00 via INTRAVENOUS

## 2018-07-05 MED ORDER — GLUCAGON HCL RDNA (DIAGNOSTIC) 1 MG IJ SOLR
INTRAMUSCULAR | Status: AC
  Filled 2018-07-05: qty 1

## 2018-07-05 MED ORDER — SUGAMMADEX SODIUM 200 MG/2ML IV SOLN
INTRAVENOUS | Status: DC | PRN
  Administered 2018-07-05: 120 mg via INTRAVENOUS

## 2018-07-05 MED ORDER — CIPROFLOXACIN IN D5W 400 MG/200ML IV SOLN
INTRAVENOUS | Status: AC
  Filled 2018-07-05: qty 200

## 2018-07-05 MED ORDER — ALBUMIN HUMAN 5 % IV SOLN
INTRAVENOUS | Status: AC
  Filled 2018-07-05: qty 250

## 2018-07-05 MED ORDER — FENTANYL CITRATE (PF) 100 MCG/2ML IJ SOLN
INTRAMUSCULAR | Status: DC | PRN
  Administered 2018-07-05 (×2): 50 ug via INTRAVENOUS

## 2018-07-05 MED ORDER — LIDOCAINE 2% (20 MG/ML) 5 ML SYRINGE
INTRAMUSCULAR | Status: DC | PRN
  Administered 2018-07-05: 60 mg via INTRAVENOUS

## 2018-07-05 MED ORDER — ALBUMIN HUMAN 5 % IV SOLN
INTRAVENOUS | Status: DC | PRN
  Administered 2018-07-05: 14:00:00 via INTRAVENOUS

## 2018-07-05 MED ORDER — ROCURONIUM BROMIDE 10 MG/ML (PF) SYRINGE
PREFILLED_SYRINGE | INTRAVENOUS | Status: DC | PRN
  Administered 2018-07-05: 40 mg via INTRAVENOUS

## 2018-07-05 MED ORDER — INDOMETHACIN 50 MG RE SUPP
RECTAL | Status: AC
  Filled 2018-07-05: qty 1

## 2018-07-05 MED ORDER — ONDANSETRON HCL 4 MG/2ML IJ SOLN
INTRAMUSCULAR | Status: DC | PRN
  Administered 2018-07-05: 4 mg via INTRAVENOUS

## 2018-07-05 MED ORDER — PROPOFOL 10 MG/ML IV BOLUS
INTRAVENOUS | Status: DC | PRN
  Administered 2018-07-05: 80 mg via INTRAVENOUS

## 2018-07-05 NOTE — Anesthesia Postprocedure Evaluation (Signed)
Anesthesia Post Note  Patient: Crystal Carlson  Procedure(s) Performed: ENDOSCOPIC RETROGRADE CHOLANGIOPANCREATOGRAPHY (ERCP) WITH PROPOFOL (N/A )     Patient location during evaluation: Endoscopy Anesthesia Type: General Level of consciousness: awake and alert Pain management: pain level controlled Vital Signs Assessment: post-procedure vital signs reviewed and stable Respiratory status: spontaneous breathing, nonlabored ventilation, respiratory function stable and patient connected to nasal cannula oxygen Cardiovascular status: blood pressure returned to baseline and stable Postop Assessment: no apparent nausea or vomiting Anesthetic complications: no    Last Vitals:  Vitals:   07/05/18 1450 07/05/18 1455  BP: 111/72   Pulse: 95 95  Resp: 17 14  Temp:    SpO2: 98% 97%    Last Pain:  Vitals:   07/05/18 1445  TempSrc:   PainSc: 0-No pain                 Jerid Catherman

## 2018-07-05 NOTE — Transfer of Care (Signed)
Immediate Anesthesia Transfer of Care Note  Patient: Crystal Carlson  Procedure(s) Performed: ENDOSCOPIC RETROGRADE CHOLANGIOPANCREATOGRAPHY (ERCP) WITH PROPOFOL (N/A )  Patient Location: Endoscopy Unit  Anesthesia Type:General  Level of Consciousness: drowsy  Airway & Oxygen Therapy: Patient Spontanous Breathing and Patient connected to nasal cannula oxygen  Post-op Assessment: Report given to RN and Post -op Vital signs reviewed and stable  Post vital signs: Reviewed and stable  Last Vitals:  Vitals Value Taken Time  BP 137/97 07/05/2018  2:05 PM  Temp 37.6 C 07/05/2018  2:05 PM  Pulse 112 07/05/2018  2:06 PM  Resp 17 07/05/2018  2:06 PM  SpO2 100 % 07/05/2018  2:06 PM  Vitals shown include unvalidated device data.  Last Pain:  Vitals:   07/05/18 1405  TempSrc: Oral  PainSc: 0-No pain         Complications: No apparent anesthesia complications

## 2018-07-05 NOTE — Discharge Instructions (Signed)
Call if question or problem otherwise clear liquid diet until 8 PM and if doing well may have soft solids and follow-up with your oncologist later this week or next week to recheck liver tests and decide the timing of chemotherapy  YOU HAD AN ENDOSCOPIC PROCEDURE TODAY: Refer to the procedure report and other information in the discharge instructions given to you for any specific questions about what was found during the examination. If this information does not answer your questions, please call [facility] office at [phone] to clarify.   YOU SHOULD EXPECT: Some feelings of bloating in the abdomen. Passage of more gas than usual. Walking can help get rid of the air that was put into your GI tract during the procedure and reduce the bloating. If you had a lower endoscopy (such as a colonoscopy or flexible sigmoidoscopy) you may notice spotting of blood in your stool or on the toilet paper. Some abdominal soreness may be present for a day or two, also.  DIET: Your first meal following the procedure should be a light meal and then it is ok to progress to your normal diet. A half-sandwich or bowl of soup is an example of a good first meal. Heavy or fried foods are harder to digest and may make you feel nauseous or bloated. Drink plenty of fluids but you should avoid alcoholic beverages for 24 hours. If you had a esophageal dilation, please see attached instructions for diet.   ACTIVITY: Your care partner should take you home directly after the procedure. You should plan to take it easy, moving slowly for the rest of the day. You can resume normal activity the day after the procedure however YOU SHOULD NOT DRIVE, use power tools, machinery or perform tasks that involve climbing or major physical exertion for 24 hours (because of the sedation medicines used during the test).   SYMPTOMS TO REPORT IMMEDIATELY: A gastroenterologist can be reached at any hour. Please call [phone]  for any of the following  symptoms:    Following upper endoscopy (EGD, EUS, ERCP, esophageal dilation) Vomiting of blood or coffee ground material  New, significant abdominal pain  New, significant chest pain or pain under the shoulder blades  Painful or persistently difficult swallowing  New shortness of breath  Black, tarry-looking or red, bloody stools  FOLLOW UP:  If any biopsies were taken you will be contacted by phone or by letter within the next 1-3 weeks. Call [phone]  if you have not heard about the biopsies in 3 weeks.  Please also call with any specific questions about appointments or follow up tests.

## 2018-07-05 NOTE — Progress Notes (Signed)
Crystal Carlson 1:01 PM  Subjective: Patient seen and examined in hospital computer chart reviewed and case discussed with her husband and her oncologist Dr. Benay Spice and she does not have any pain but her urine has been dark for a week or 2 and she has been getting various chemotherapy regimens since I last saw her she has no new complaints  Objective: Signs stable afebrile no acute distress exam please see previous assessment evaluation labs CT and MRI reviewed  Assessment: Obstructive jaundice metastatic pancreatic cancer  Plan: Okay to proceed with ERCP with anesthesia assistance  [facility]  Pager [phone] After 5PM or if no answer call [phone]

## 2018-07-05 NOTE — Anesthesia Procedure Notes (Addendum)
Procedure Name: Intubation Date/Time: 07/05/2018 1:17 PM Performed by: Sharlette Dense, CRNA Patient Re-evaluated:Patient Re-evaluated prior to induction Oxygen Delivery Method: Circle system utilized Preoxygenation: Pre-oxygenation with 100% oxygen Induction Type: IV induction Ventilation: Mask ventilation without difficulty Laryngoscope Size: Miller and 2 Grade View: Grade I Tube type: Oral Tube size: 7.0 mm Number of attempts: 1 Placement Confirmation: ETT inserted through vocal cords under direct vision,  positive ETCO2 and breath sounds checked- equal and bilateral Secured at: 20 cm Tube secured with: Tape Dental Injury: Teeth and Oropharynx as per pre-operative assessment

## 2018-07-05 NOTE — Anesthesia Preprocedure Evaluation (Signed)
Anesthesia Evaluation  Patient identified by MRN, date of birth, ID band Patient awake    Reviewed: Allergy & Precautions, NPO status , Patient's Chart, lab work & pertinent test results  History of Anesthesia Complications Negative for: history of anesthetic complications  Airway Mallampati: II  TM Distance: >3 FB Neck ROM: Full    Dental  (+) Dental Advisory Given, Teeth Intact, Missing   Pulmonary neg pulmonary ROS,    breath sounds clear to auscultation       Cardiovascular hypertension,  Rhythm:Regular Rate:Normal     Neuro/Psych PSYCHIATRIC DISORDERS Anxiety Depression  Neuromuscular disease    GI/Hepatic (+)     substance abuse  , Pancreatic cancer with liver metastasis Chronic pancreatitis bile duct obstruction   Endo/Other  negative endocrine ROS  Renal/GU Renal disease     Musculoskeletal  (+) narcotic dependent  Abdominal   Peds  Hematology  (+) Blood dyscrasia, anemia ,   Anesthesia Other Findings   Reproductive/Obstetrics                             Anesthesia Physical Anesthesia Plan  ASA: III  Anesthesia Plan: General   Post-op Pain Management:    Induction: Intravenous  PONV Risk Score and Plan: 3 and Ondansetron and Dexamethasone  Airway Management Planned: Oral ETT  Additional Equipment: None  Intra-op Plan:   Post-operative Plan: Extubation in OR  Informed Consent: I have reviewed the patients History and Physical, chart, labs and discussed the procedure including the risks, benefits and alternatives for the proposed anesthesia with the patient or authorized representative who has indicated his/her understanding and acceptance.   Dental advisory given  Plan Discussed with: CRNA and Surgeon  Anesthesia Plan Comments:         Anesthesia Quick Evaluation

## 2018-07-05 NOTE — Op Note (Signed)
[facility] Patient Name: Crystal Carlson Procedure Date: 07/05/2018 MRN: [id number] Attending MD: Clarene Essex , MD Date of Birth: January 20, 1966 CSN: [id number] Age: 52 Admit Type: Outpatient Procedure:                ERCP Indications:              Abnormal abdominal CT, Jaundice, Malignant tumor of                            the head of pancreas want to reevaluate previously                            placed stent Providers:                Clarene Essex, MD, Carmie End, RN, Cletis Athens,                            Technician, Danley Danker, CRNA Referring MD:              Medicines:                General Anesthesia Complications:            No immediate complications. Estimated Blood Loss:     Estimated blood loss: none. Procedure:                Pre-Anesthesia Assessment:                           - Prior to the procedure, a History and Physical                            was performed, and patient medications and                            allergies were reviewed. The patient's tolerance of                            previous anesthesia was also reviewed. The risks                            and benefits of the procedure and the sedation                            options and risks were discussed with the patient.                            All questions were answered, and informed consent                            was obtained. Prior Anticoagulants: The patient has                            taken no previous anticoagulant or antiplatelet                            agents. ASA  Grade Assessment: III - A patient with                            severe systemic disease. After reviewing the risks                            and benefits, the patient was deemed in                            satisfactory condition to undergo the procedure.                           After obtaining informed consent, the scope was                            passed under direct vision. Throughout  the                            procedure, the patient's blood pressure, pulse, and                            oxygen saturations were monitored continuously. The                            was introduced through the mouth, and used to                            inject contrast into and used to cannulate the bile                            duct. The ERCP was accomplished without difficulty.                            The patient tolerated the procedure well. Scope In: Scope Out: Findings:      One stent which had been placed through the major papilla into the       biliary tree was no longer visible. It had migrated into the duct very       short ways Based on fluoroscopy with the scope in the proper position       the major papilla was mildly edematous. Deep selective cannulation was       obtained on the first attempt using the sphincterotome loaded with the       0.035 inch Soft Jagwire which was passed into the biliary tree. We       injected dye at this point to the intrahepatics but we did not get great       opacification so we exchanged the sphincterotome with the adjustable       9-12 mm balloon the biliary tree was swept with both the 9 and 12 mm       balloon starting at the bifurcation. Sludge and debris and stone       material was swept from the duct. We then proceeded with a few occlusion       cholangiograms to better delineate the anatomy and based on a dilated  segment just above the bifurcation and the distal end of the stent being       inside the ampulla we elected to place one 10 Fr by 8 cm uncovered metal       stent with no external flaps and no internal flaps which was placed 7.5       cm into the common bile duct. Necrotic tissue and sludge flowed through       the stent. The stent was in good position. The upper end of the stent       was placed in the dilated segment and there was adequate biliary       drainage and the wire and introducer and scope were  removed and the       patient tolerated the procedure well there was no pancreatic duct       injection or wire advancement throughout the procedure Impression:               - A previously placed stent had migrated into the                            biliary tree a very short ways confirmed under                            fluoroscopy.                           - The major papilla appeared edematous.                           - The biliary tree was swept and sludge debris and                            stone material was found and removed. Multiple                            balloon pull-through's were done without any                            resistance at the ampulla                           - One uncovered metal stent was placed into the                            common bile duct. Moderate Sedation:      Not Applicable - Patient had care per Anesthesia. Recommendation:           - Clear liquid diet for 6 hours. And if okay may                            have soft solids                           - Continue present medications.                           - Return to  GI clinic PRN.                           - Telephone GI clinic if symptomatic PRN. And                            follow-up later this week or next week with                            oncology to repeat liver tests and decide the                            timing of chemotherapy Procedure Code(s):        --- Professional ---                           [phone], Endoscopic retrograde                            cholangiopancreatography (ERCP); with removal and                            exchange of stent(s), biliary or pancreatic duct,                            including pre- and post-dilation and guide wire                            passage, when performed, including sphincterotomy,                            when performed, each stent exchanged                           [id number], Endoscopic retrograde                             cholangiopancreatography (ERCP); with removal of                            calculi/debris from biliary/pancreatic duct(s) Diagnosis Code(s):        --- Professional ---                           G62.694W, Displacement of bile duct prosthesis,                            initial encounter                           R17, Unspecified jaundice                           C25.0, Malignant neoplasm of head of pancreas                           K83.8, Other specified diseases  of biliary tract                           R93.5, Abnormal findings on diagnostic imaging of                            other abdominal regions, including retroperitoneum CPT copyright 2018 [employer]. All rights reserved. The codes documented in this report are preliminary and upon coder review may  be revised to meet current compliance requirements. Clarene Essex, MD 07/05/2018 2:02:16 PM This report has been signed electronically. Number of Addenda: 0

## 2018-07-07 ENCOUNTER — Other Ambulatory Visit: Payer: Self-pay | Admitting: Nurse Practitioner

## 2018-07-07 ENCOUNTER — Telehealth: Payer: Self-pay | Admitting: *Deleted

## 2018-07-07 ENCOUNTER — Encounter (HOSPITAL_COMMUNITY): Payer: Self-pay | Admitting: Gastroenterology

## 2018-07-07 DIAGNOSIS — C259 Malignant neoplasm of pancreas, unspecified: Secondary | ICD-10-CM

## 2018-07-07 DIAGNOSIS — G893 Neoplasm related pain (acute) (chronic): Secondary | ICD-10-CM

## 2018-07-07 DIAGNOSIS — C787 Secondary malignant neoplasm of liver and intrahepatic bile duct: Secondary | ICD-10-CM

## 2018-07-07 MED ORDER — HYDROMORPHONE HCL 4 MG PO TABS: 4.0000 mg | ORAL_TABLET | ORAL | 0 refills | Status: DC | PRN

## 2018-07-07 MED FILL — HYDROmorphone HCL 4 MG TABS: 4 | 5 days supply | Qty: 60 | Fill #0

## 2018-07-07 NOTE — Telephone Encounter (Signed)
Husband called to request refill on hydromorphone stating she is out. She has been taking it consistently every 4 hours even with the MS Contin every 12 hours. Confirmed script should go to [facility] : [address], [place] [phone] (Phone) [phone] (Fax)  NP will electronically send script this evening.      He also reports she is having lower abdominal pain since Dr. Watt Climes did the stent procedure. Instructed him to call Dr. Perley Jain office regarding this pain. He agrees to do so.

## 2018-07-08 ENCOUNTER — Inpatient Hospital Stay: Payer: Medicaid Other

## 2018-07-08 ENCOUNTER — Telehealth: Payer: Self-pay | Admitting: *Deleted

## 2018-07-08 ENCOUNTER — Telehealth: Payer: Self-pay | Admitting: Oncology

## 2018-07-08 ENCOUNTER — Other Ambulatory Visit: Payer: Self-pay | Admitting: *Deleted

## 2018-07-08 ENCOUNTER — Inpatient Hospital Stay (HOSPITAL_BASED_OUTPATIENT_CLINIC_OR_DEPARTMENT_OTHER): Payer: Medicaid Other | Admitting: Oncology

## 2018-07-08 VITALS — BP 105/74 | HR 109 | Temp 98.1°F | Resp 17 | Ht 65.0 in | Wt 98.4 lb

## 2018-07-08 DIAGNOSIS — R52 Pain, unspecified: Secondary | ICD-10-CM

## 2018-07-08 DIAGNOSIS — R748 Abnormal levels of other serum enzymes: Secondary | ICD-10-CM | POA: Diagnosis not present

## 2018-07-08 DIAGNOSIS — Z8249 Family history of ischemic heart disease and other diseases of the circulatory system: Secondary | ICD-10-CM

## 2018-07-08 DIAGNOSIS — C787 Secondary malignant neoplasm of liver and intrahepatic bile duct: Secondary | ICD-10-CM | POA: Diagnosis present

## 2018-07-08 DIAGNOSIS — I81 Portal vein thrombosis: Secondary | ICD-10-CM

## 2018-07-08 DIAGNOSIS — Z681 Body mass index (BMI) 19 or less, adult: Secondary | ICD-10-CM

## 2018-07-08 DIAGNOSIS — K750 Abscess of liver: Secondary | ICD-10-CM | POA: Diagnosis present

## 2018-07-08 DIAGNOSIS — Z833 Family history of diabetes mellitus: Secondary | ICD-10-CM

## 2018-07-08 DIAGNOSIS — D509 Iron deficiency anemia, unspecified: Secondary | ICD-10-CM | POA: Diagnosis present

## 2018-07-08 DIAGNOSIS — Z79891 Long term (current) use of opiate analgesic: Secondary | ICD-10-CM

## 2018-07-08 DIAGNOSIS — E860 Dehydration: Secondary | ICD-10-CM

## 2018-07-08 DIAGNOSIS — T80211A Bloodstream infection due to central venous catheter, initial encounter: Principal | ICD-10-CM | POA: Diagnosis present

## 2018-07-08 DIAGNOSIS — R410 Disorientation, unspecified: Secondary | ICD-10-CM

## 2018-07-08 DIAGNOSIS — A4152 Sepsis due to Pseudomonas: Secondary | ICD-10-CM | POA: Diagnosis present

## 2018-07-08 DIAGNOSIS — C250 Malignant neoplasm of head of pancreas: Secondary | ICD-10-CM | POA: Diagnosis present

## 2018-07-08 DIAGNOSIS — R5383 Other fatigue: Secondary | ICD-10-CM

## 2018-07-08 DIAGNOSIS — Z7952 Long term (current) use of systemic steroids: Secondary | ICD-10-CM

## 2018-07-08 DIAGNOSIS — R627 Adult failure to thrive: Secondary | ICD-10-CM | POA: Diagnosis present

## 2018-07-08 DIAGNOSIS — Y848 Other medical procedures as the cause of abnormal reaction of the patient, or of later complication, without mention of misadventure at the time of the procedure: Secondary | ICD-10-CM | POA: Diagnosis present

## 2018-07-08 DIAGNOSIS — E871 Hypo-osmolality and hyponatremia: Secondary | ICD-10-CM | POA: Diagnosis present

## 2018-07-08 DIAGNOSIS — C259 Malignant neoplasm of pancreas, unspecified: Secondary | ICD-10-CM

## 2018-07-08 DIAGNOSIS — F419 Anxiety disorder, unspecified: Secondary | ICD-10-CM | POA: Diagnosis present

## 2018-07-08 DIAGNOSIS — G893 Neoplasm related pain (acute) (chronic): Secondary | ICD-10-CM

## 2018-07-08 DIAGNOSIS — Z8261 Family history of arthritis: Secondary | ICD-10-CM

## 2018-07-08 DIAGNOSIS — T451X5A Adverse effect of antineoplastic and immunosuppressive drugs, initial encounter: Secondary | ICD-10-CM | POA: Diagnosis present

## 2018-07-08 DIAGNOSIS — G9341 Metabolic encephalopathy: Secondary | ICD-10-CM | POA: Diagnosis present

## 2018-07-08 DIAGNOSIS — E119 Type 2 diabetes mellitus without complications: Secondary | ICD-10-CM | POA: Diagnosis present

## 2018-07-08 DIAGNOSIS — K861 Other chronic pancreatitis: Secondary | ICD-10-CM | POA: Diagnosis present

## 2018-07-08 DIAGNOSIS — I10 Essential (primary) hypertension: Secondary | ICD-10-CM | POA: Diagnosis present

## 2018-07-08 DIAGNOSIS — E43 Unspecified severe protein-calorie malnutrition: Secondary | ICD-10-CM | POA: Diagnosis present

## 2018-07-08 DIAGNOSIS — C257 Malignant neoplasm of other parts of pancreas: Secondary | ICD-10-CM | POA: Diagnosis not present

## 2018-07-08 DIAGNOSIS — D6959 Other secondary thrombocytopenia: Secondary | ICD-10-CM | POA: Diagnosis present

## 2018-07-08 DIAGNOSIS — N179 Acute kidney failure, unspecified: Secondary | ICD-10-CM | POA: Diagnosis present

## 2018-07-08 DIAGNOSIS — Z86718 Personal history of other venous thrombosis and embolism: Secondary | ICD-10-CM

## 2018-07-08 DIAGNOSIS — D630 Anemia in neoplastic disease: Secondary | ICD-10-CM | POA: Diagnosis present

## 2018-07-08 DIAGNOSIS — R180 Malignant ascites: Secondary | ICD-10-CM | POA: Diagnosis present

## 2018-07-08 LAB — CBC WITH DIFFERENTIAL (CANCER CENTER ONLY)
ABS IMMATURE GRANULOCYTES: 0.44 10*3/uL — AB (ref 0.00–0.07)
Basophils Absolute: 0 10*3/uL (ref 0.0–0.1)
Basophils Relative: 0 %
EOS ABS: 0 10*3/uL (ref 0.0–0.5)
EOS PCT: 0 %
HCT: 30.5 % — ABNORMAL LOW (ref 36.0–46.0)
HEMOGLOBIN: 9.9 g/dL — AB (ref 12.0–15.0)
Immature Granulocytes: 2 %
LYMPHS PCT: 3 %
Lymphs Abs: 0.7 10*3/uL (ref 0.7–4.0)
MCH: 26 pg (ref 26.0–34.0)
MCHC: 32.5 g/dL (ref 30.0–36.0)
MCV: 80.1 fL (ref 80.0–100.0)
Monocytes Absolute: 1 10*3/uL (ref 0.1–1.0)
Monocytes Relative: 4 %
Neutro Abs: 23.8 10*3/uL — ABNORMAL HIGH (ref 1.7–7.7)
Neutrophils Relative %: 91 %
Platelet Count: 182 10*3/uL (ref 150–400)
RBC: 3.81 MIL/uL — ABNORMAL LOW (ref 3.87–5.11)
RDW: 26.1 % — ABNORMAL HIGH (ref 11.5–15.5)
WBC: 25.9 10*3/uL — AB (ref 4.0–10.5)
nRBC: 0 % (ref 0.0–0.2)

## 2018-07-08 LAB — CMP (CANCER CENTER ONLY)
ALK PHOS: 332 U/L — AB (ref 38–126)
ALT: 103 U/L — ABNORMAL HIGH (ref 0–44)
AST: 52 U/L — AB (ref 15–41)
Albumin: 2.1 g/dL — ABNORMAL LOW (ref 3.5–5.0)
Anion gap: 10 (ref 5–15)
BILIRUBIN TOTAL: 9 mg/dL — AB (ref 0.3–1.2)
BUN: 39 mg/dL — AB (ref 6–20)
CALCIUM: 9.3 mg/dL (ref 8.9–10.3)
CO2: 27 mmol/L (ref 22–32)
CREATININE: 1.25 mg/dL — AB (ref 0.44–1.00)
Chloride: 89 mmol/L — ABNORMAL LOW (ref 98–111)
GFR, EST NON AFRICAN AMERICAN: 49 mL/min — AB (ref >60)
GFR, Est AFR Am: 57 mL/min — ABNORMAL LOW (ref >60)
Glucose, Bld: 234 mg/dL — ABNORMAL HIGH (ref 70–99)
Potassium: 5.3 mmol/L — ABNORMAL HIGH (ref 3.5–5.1)
Sodium: 126 mmol/L — ABNORMAL LOW (ref 135–145)
TOTAL PROTEIN: 7.3 g/dL (ref 6.5–8.1)

## 2018-07-08 MED ORDER — HEPARIN SOD (PORK) LOCK FLUSH 100 UNIT/ML IV SOLN
500.0000 [IU] | Freq: Once | INTRAVENOUS | Status: AC
  Administered 2018-07-08: 500 [IU] via INTRAVENOUS
  Filled 2018-07-08: qty 5

## 2018-07-08 MED ORDER — SODIUM CHLORIDE 0.9% FLUSH
10.0000 mL | INTRAVENOUS | Status: DC | PRN
  Administered 2018-07-08: 10 mL via INTRAVENOUS
  Filled 2018-07-08: qty 10

## 2018-07-08 NOTE — Progress Notes (Signed)
[facility] OFFICE PROGRESS NOTE   Diagnosis: Pancreas cancer  INTERVAL HISTORY:   Crystal Carlson returns prior to his scheduled visit.  She has been lethargic and confused over the past few days.  Limited oral intake.  She has increased pain and has been taking Dilaudid every 4 hours.  She took 2 Ativan doses last night. The abdomen is not distended.  The pain is throughout the mid abdomen.  No high fever. She presented last week with new onset jaundice.  She underwent an ERCP on 07/05/2018 by Dr. Watt Climes.  The biliary tree was dilated above the pre-existing stent.  A new uncovered metal stent was placed into the common bile duct.  Necrotic tissue and sludge flow through the stent. Objective:  Vital signs in last 24 hours:  Blood pressure 105/74, pulse (!) 109, temperature 98.1 F (36.7 C), temperature source Oral, resp. rate 17, height 5\' 5"  (1.651 m), weight 98 lb 6.4 oz (44.6 kg), SpO2 100 %.    HEENT: Scleral icterus, no thrush Resp: Lungs clear bilaterally Cardio: Regular rate and rhythm GI: No hepatomegaly, no mass, nontender, slight induration at the umbilicus without a discrete mass, minimal ascites Vascular: No leg edema Neuro: Alert and oriented, follows commands  Portacath/PICC-without erythema  Lab Results:  Lab Results  Component Value Date   WBC 25.9 (H) 07/08/2018   HGB 9.9 (L) 07/08/2018   HCT 30.5 (L) 07/08/2018   MCV 80.1 07/08/2018   PLT 182 07/08/2018   NEUTROABS 23.8 (H) 07/08/2018    CMP  Lab Results  Component Value Date   NA 126 (L) 07/08/2018   K 5.3 (H) 07/08/2018   CL 89 (L) 07/08/2018   CO2 27 07/08/2018   GLUCOSE 234 (H) 07/08/2018   BUN 39 (H) 07/08/2018   CREATININE 1.25 (H) 07/08/2018   CALCIUM 9.3 07/08/2018   PROT 7.3 07/08/2018   ALBUMIN 2.1 (L) 07/08/2018   AST 52 (H) 07/08/2018   ALT 103 (H) 07/08/2018   ALKPHOS 332 (H) 07/08/2018   BILITOT 9.0 (HH) 07/08/2018   GFRNONAA 49 (L) 07/08/2018   GFRAA 57 (L) 07/08/2018     Lab Results  Component Value Date   CEA1 1.3 05/25/2017    Lab Results  Component Value Date   INR 1.62 06/21/2018    Imaging:  Dg Ercp  Result Date: 07/05/2018 CLINICAL DATA:  Pancreatic carcinoma and biliary obstruction. EXAM: ERCP TECHNIQUE: Multiple spot images obtained with the fluoroscopic device and submitted for interpretation post-procedure. COMPARISON:  Prior year CP on 06/09/2017, CT of the abdomen on 06/16/2018 and MRI on 06/19/2018 FINDINGS: Single submitted image demonstrates overlapping self expanding metallic biliary stents with a more distal common bile duct stent appearing to extend to the duodenum and a second overlapping stent extending up into a right intrahepatic duct. IMPRESSION: Overlapping metallic biliary stents present to treat malignant biliary obstruction. These images were submitted for radiologic interpretation only. Please see the procedural report for the amount of contrast and the fluoroscopy time utilized. Electronically Signed   By: Aletta Edouard M.D.   On: 07/05/2018 14:36    Medications: I have reviewed the patient's current medications.   Assessment/Plan: 1. 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 Adenocarcinoma consistent with pancreatobiliary primary.  Cycle 1 FOLFIRINOX 06/18/2017  Cycle 2FOLFIRINOX 07/02/2017  Cycle 3 FOLFIRINOX 07/16/2017 (oxaliplatin dose reduced due to mild thrombocytopenia)  CT 07/24/2017-new splenomegaly and left hydronephrosis  Cycle 4 FOLFIRINOX 08/08/2017  Cycle 5  FOLFIRINOX 08/24/2017  CT abdomen/pelvis 09/03/2017-stable liver lesions and pancreas mass, stable right biliary dilatation, possible new 8 mm right liver lesion versus a vascular phenomena, lesion at the right paracolic gutter was present in the past-significance unclear  Cycle 6 FOLFIRINOX1/29/2019  Cycle7 FOLFIRINOX2/19/2019 (Irinotecan andOxaliplatin dose reduced due to thrombocytopenia)  CT abdomen/pelvis 10/25/2017- findings concerning for enterocolitis. No bowel obstruction. Ill-defined pancreatic head mass. Multiple hepatic hypoenhancing lesions with the largest measuring 1.9 x 2.0 cm, previously 1.7 x 1.7 cm; 9 x 10 mm hypoenhancing lesion in the inferior right lobe of the liver slightly larger compared to the previous CT; 1.6 x 2.4 cm enhancing lesion in the right paracolic gutter increased in size compared to the prior CT.  Cycle 1 gemcitabine/Abraxane 11/04/2017  Cycle 2 gemcitabine/Abraxane 11/25/2017  Cycle 3 gemcitabine/Abraxane 12/08/2017  Cycle 4 gemcitabine 12/23/2017 (Abraxane held secondary to neuropathy)  CTs abdomen/pelvis 01/12/2018- decrease in size of the pancreatic mass and hepatic metastasis; slight interval decrease in size of right paracolic gutter implant; worsening abdominal/pelvic ascites. Bowel wall thickening likely due to ascites and low albumin. 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  Cycle 5 gemcitabine 01/13/2018 (Abraxane held secondary to neuropathy)  Cycle 6 gemcitabine/Abraxane 01/27/2018  CT abdomen/pelvis 02/01/2018- dominant metastases in the liver are unchanged, multiple new low-density lesions throughout the right lobe-small abscesses?, No change in pancreas mass  Cycle 7 gemcitabine/Abraxane 02/16/2018(gemcitabine and Abraxane dose reduced due to previous neutropenia)  Cycle 8 gemcitabine/Abraxane 03/03/2018  Cycle 9 gemcitabine/Abraxane 03/25/2018  Cycle 10 gemcitabine/Abraxane 04/14/2018  Restaging CTs  05/04/2018- similar ill-defined pancreatic mass; multiple liver metastases again noted, appears slightly improved; increase in volume of abdominal and pelvic ascites; diffuse small bowel wall edema and mild edema of the ascending colon and transverse colon.  Cycle 11 gemcitabine/Abraxane 05/05/2018  Cycle 12 gemcitabine/Abraxane 06/02/2018  CT 06/16/2018-stable pancreas mass extending into retroperitoneal soft tissue, multiple low-density lesions in the right liver-increased in size probable superior mesenteric vein thrombosis, mild left hydronephrosis  Ultrasound aspiration of a liver lesion 06/22/2018-negative cytology and culture   2. Pain secondary to #1.Improved.  3. Weight losssecondary to #1.  4. Microcytic anemia  Transfused with packed red blood cells 07/25/2017, 16 2019, and 11/27/2017  5. History of thrombocytopenia secondary to chemotherapy  6. Port-A-Cath placement 06/04/2017  7. Elevated liver enzymes/bilirubin10/22/2018-likely early biliary obstruction secondary to pancreas cancer, status post ERCP-placement of a common bile duct stent 06/09/2018  8.Postprandial abdominal pain. Protonix initiated 07/16/2017.Trial of Reglan initiated 08/24/2017.  9.Admission with fever 07/24/2017-no source for infection identified, resolved   10.Diarrheafollowing cycle 3 FOLFIRINOX-resolved  11.Left hydronephrosis noted on CT12/03/2017-etiology unclear, urology consulted-stent not recommended.Mild left hydronephrosis noted on CT 10/25/2017. Creatinine normal 10/27/2017.  12. Admission 08/31/2017 with a high fever, blood culture positive for gram-negative rods-Klebsiella pneumoniae identified  13.Fever 10/25/2017 status post evaluation in the emergency department. CT with findings concerning for enterocolitis.   14. Admission 11/11/2017 with a high fever-blood cultures (peripheral and Port-A-Cath) positive for gram-negative rods--STENOTROPHOMONAS  MALTOPHILIA;course of Septra completed as an outpatient.  15. Rash-potentially secondary to gemcitabine or infection, improved  16. Pancytopenia secondary to chemotherapy  17.Intermittent fever. Question "tumor fever", question infection. Now on Bactrim prophylaxis, improved  18. Ascites.Paracentesis 01/14/2018- reactive mesothelial cells, mixed inflammation;culture positive for Klebsiella and enterococcustreated withAugmentin  Admission with fever 01/30/2018, blood cultures negative, discharged on ciprofloxacin/Flagyl  Recurrent ascites, paracentesis 02/23/2018-culture positive for strep viridans, placed on Levaquin  Paracentesis 03/03/2018- 2 L of fluid removed, culture and cytology negative  Trialof Aldactone/Lasix started 05/26/2018  19.Status post evaluation back pain in the emergency department 04/23/2018-MRI lumbar spine with L5-S1 left subarticular disc extrusion  L5-S1laminotomyand microdiscectomy 05/17/2018, pain resolved  20. Admission 06/16/2018 with fever and failure to thrive- cultures negative, suspect liver abscesses 21.  Elevated liver enzymes and bilirubin 07/02/2018  ERCP 07/05/2018- migration of previously noted biliary stent with dilated biliary tree, new uncovered metal stent placed in the common bile duct    Disposition:  Crystal Carlson has metastatic pancreas cancer.  She underwent placement of a new biliary stent earlier this week after presenting with of a liver enzymes and jaundice last week.  The liver enzymes have improved, the bilirubin remains elevated. She is not a candidate for further chemotherapy unless the hyperbilirubinemia improves.  The chemistry panel today suggest a component of dehydration.  She will discontinue Lasix and Aldactone.  We will repeat a chemistry panel when she returns on 07/13/2018.  She has increased pain, likely secondary to the pancreas tumor.  She will increase the MS Contin dose to 30 mg in the morning and 15  mg in the evening.  She will continue Dilaudid as needed for breakthrough pain.  The lethargy and confusion over the last few days is likely secondary to the use of narcotics and Ativan.  I plan to schedule a palliative care consultation if she has not improved when we see her next week.  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  Betsy Coder, MD  07/08/2018  2:01 PM

## 2018-07-08 NOTE — Telephone Encounter (Signed)
Scheduled appt per 11/21 sch message - pt husband is aware of appt

## 2018-07-08 NOTE — Telephone Encounter (Signed)
Husband wants patient seen today. She is more lethargic, confused and having increased pain. He did call Dr. Perley Jain office today and was told the increased pain could be constipation. He gave her magnesium citrate last night with good results and she is still in pain. Denies fever. Dr. Benay Spice will see him today at 10:30 with labs.

## 2018-07-08 NOTE — Patient Instructions (Signed)
Stop aldactone and lasix.  Push fluids by mouthy MS Contin 30 mg in morning and 15 mg at night Return as scheduled

## 2018-07-09 ENCOUNTER — Inpatient Hospital Stay (HOSPITAL_COMMUNITY)
Admission: AD | Admit: 2018-07-09 | Discharge: 2018-07-16 | DRG: 314 | Disposition: A | Discharge status: Home Health | Payer: Medicaid Other | Source: Ambulatory Visit | Attending: Family Medicine | Admitting: Family Medicine

## 2018-07-09 ENCOUNTER — Telehealth: Payer: Self-pay | Admitting: *Deleted

## 2018-07-09 ENCOUNTER — Encounter (HOSPITAL_COMMUNITY): Payer: Self-pay | Admitting: *Deleted

## 2018-07-09 ENCOUNTER — Telehealth: Payer: Self-pay | Admitting: Emergency Medicine

## 2018-07-09 DIAGNOSIS — D696 Thrombocytopenia, unspecified: Secondary | ICD-10-CM | POA: Diagnosis present

## 2018-07-09 DIAGNOSIS — R180 Malignant ascites: Secondary | ICD-10-CM | POA: Diagnosis present

## 2018-07-09 DIAGNOSIS — C801 Malignant (primary) neoplasm, unspecified: Secondary | ICD-10-CM | POA: Diagnosis not present

## 2018-07-09 DIAGNOSIS — Z7952 Long term (current) use of systemic steroids: Secondary | ICD-10-CM | POA: Diagnosis not present

## 2018-07-09 DIAGNOSIS — R7881 Bacteremia: Secondary | ICD-10-CM

## 2018-07-09 DIAGNOSIS — E440 Moderate protein-calorie malnutrition: Secondary | ICD-10-CM | POA: Diagnosis present

## 2018-07-09 DIAGNOSIS — T80211A Bloodstream infection due to central venous catheter, initial encounter: Secondary | ICD-10-CM | POA: Diagnosis present

## 2018-07-09 DIAGNOSIS — K831 Obstruction of bile duct: Secondary | ICD-10-CM | POA: Diagnosis not present

## 2018-07-09 DIAGNOSIS — N179 Acute kidney failure, unspecified: Secondary | ICD-10-CM | POA: Diagnosis present

## 2018-07-09 DIAGNOSIS — C259 Malignant neoplasm of pancreas, unspecified: Secondary | ICD-10-CM | POA: Diagnosis not present

## 2018-07-09 DIAGNOSIS — I10 Essential (primary) hypertension: Secondary | ICD-10-CM | POA: Diagnosis present

## 2018-07-09 DIAGNOSIS — R7989 Other specified abnormal findings of blood chemistry: Secondary | ICD-10-CM | POA: Diagnosis present

## 2018-07-09 DIAGNOSIS — K861 Other chronic pancreatitis: Secondary | ICD-10-CM | POA: Diagnosis not present

## 2018-07-09 DIAGNOSIS — T451X5A Adverse effect of antineoplastic and immunosuppressive drugs, initial encounter: Secondary | ICD-10-CM | POA: Diagnosis present

## 2018-07-09 DIAGNOSIS — R933 Abnormal findings on diagnostic imaging of other parts of digestive tract: Secondary | ICD-10-CM | POA: Diagnosis not present

## 2018-07-09 DIAGNOSIS — D6959 Other secondary thrombocytopenia: Secondary | ICD-10-CM | POA: Diagnosis present

## 2018-07-09 DIAGNOSIS — C799 Secondary malignant neoplasm of unspecified site: Secondary | ICD-10-CM | POA: Diagnosis not present

## 2018-07-09 DIAGNOSIS — G9341 Metabolic encephalopathy: Secondary | ICD-10-CM | POA: Diagnosis present

## 2018-07-09 DIAGNOSIS — Z681 Body mass index (BMI) 19 or less, adult: Secondary | ICD-10-CM

## 2018-07-09 DIAGNOSIS — C787 Secondary malignant neoplasm of liver and intrahepatic bile duct: Secondary | ICD-10-CM

## 2018-07-09 DIAGNOSIS — G893 Neoplasm related pain (acute) (chronic): Secondary | ICD-10-CM | POA: Diagnosis present

## 2018-07-09 DIAGNOSIS — F419 Anxiety disorder, unspecified: Secondary | ICD-10-CM | POA: Diagnosis present

## 2018-07-09 DIAGNOSIS — D509 Iron deficiency anemia, unspecified: Secondary | ICD-10-CM | POA: Diagnosis present

## 2018-07-09 DIAGNOSIS — Y848 Other medical procedures as the cause of abnormal reaction of the patient, or of later complication, without mention of misadventure at the time of the procedure: Secondary | ICD-10-CM | POA: Diagnosis present

## 2018-07-09 DIAGNOSIS — R627 Adult failure to thrive: Secondary | ICD-10-CM | POA: Diagnosis not present

## 2018-07-09 DIAGNOSIS — E871 Hypo-osmolality and hyponatremia: Secondary | ICD-10-CM | POA: Diagnosis present

## 2018-07-09 DIAGNOSIS — B965 Pseudomonas (aeruginosa) (mallei) (pseudomallei) as the cause of diseases classified elsewhere: Secondary | ICD-10-CM | POA: Diagnosis not present

## 2018-07-09 DIAGNOSIS — C250 Malignant neoplasm of head of pancreas: Secondary | ICD-10-CM | POA: Diagnosis present

## 2018-07-09 DIAGNOSIS — E43 Unspecified severe protein-calorie malnutrition: Secondary | ICD-10-CM | POA: Diagnosis present

## 2018-07-09 DIAGNOSIS — D630 Anemia in neoplastic disease: Secondary | ICD-10-CM | POA: Diagnosis present

## 2018-07-09 DIAGNOSIS — T80211D Bloodstream infection due to central venous catheter, subsequent encounter: Secondary | ICD-10-CM | POA: Diagnosis not present

## 2018-07-09 DIAGNOSIS — Z95828 Presence of other vascular implants and grafts: Secondary | ICD-10-CM | POA: Diagnosis not present

## 2018-07-09 DIAGNOSIS — E119 Type 2 diabetes mellitus without complications: Secondary | ICD-10-CM | POA: Diagnosis present

## 2018-07-09 DIAGNOSIS — Z79891 Long term (current) use of opiate analgesic: Secondary | ICD-10-CM | POA: Diagnosis not present

## 2018-07-09 DIAGNOSIS — Z833 Family history of diabetes mellitus: Secondary | ICD-10-CM | POA: Diagnosis not present

## 2018-07-09 DIAGNOSIS — K750 Abscess of liver: Secondary | ICD-10-CM | POA: Diagnosis present

## 2018-07-09 DIAGNOSIS — R5383 Other fatigue: Secondary | ICD-10-CM | POA: Diagnosis not present

## 2018-07-09 DIAGNOSIS — A419 Sepsis, unspecified organism: Secondary | ICD-10-CM | POA: Diagnosis present

## 2018-07-09 DIAGNOSIS — R945 Abnormal results of liver function studies: Secondary | ICD-10-CM

## 2018-07-09 DIAGNOSIS — A4152 Sepsis due to Pseudomonas: Secondary | ICD-10-CM | POA: Diagnosis present

## 2018-07-09 LAB — BLOOD CULTURE ID PANEL (REFLEXED)
Acinetobacter baumannii: NOT DETECTED
CANDIDA PARAPSILOSIS: NOT DETECTED
CANDIDA TROPICALIS: NOT DETECTED
Candida albicans: NOT DETECTED
Candida glabrata: NOT DETECTED
Candida krusei: NOT DETECTED
Carbapenem resistance: NOT DETECTED
ESCHERICHIA COLI: NOT DETECTED
Enterobacter cloacae complex: NOT DETECTED
Enterobacteriaceae species: NOT DETECTED
Enterococcus species: NOT DETECTED
Haemophilus influenzae: NOT DETECTED
KLEBSIELLA PNEUMONIAE: NOT DETECTED
Klebsiella oxytoca: NOT DETECTED
Listeria monocytogenes: NOT DETECTED
NEISSERIA MENINGITIDIS: NOT DETECTED
PROTEUS SPECIES: NOT DETECTED
Pseudomonas aeruginosa: DETECTED — AB
SERRATIA MARCESCENS: NOT DETECTED
STAPHYLOCOCCUS SPECIES: NOT DETECTED
STREPTOCOCCUS AGALACTIAE: NOT DETECTED
Staphylococcus aureus (BCID): NOT DETECTED
Streptococcus pneumoniae: NOT DETECTED
Streptococcus pyogenes: NOT DETECTED
Streptococcus species: NOT DETECTED

## 2018-07-09 LAB — CBC WITH DIFFERENTIAL/PLATELET
Abs Immature Granulocytes: 0.4 10*3/uL — ABNORMAL HIGH (ref 0.00–0.07)
BASOS PCT: 0 %
Basophils Absolute: 0 10*3/uL (ref 0.0–0.1)
EOS PCT: 1 %
Eosinophils Absolute: 0.3 10*3/uL (ref 0.0–0.5)
HEMATOCRIT: 30.8 % — AB (ref 36.0–46.0)
HEMOGLOBIN: 9.5 g/dL — AB (ref 12.0–15.0)
Immature Granulocytes: 2 %
LYMPHS ABS: 0.5 10*3/uL — AB (ref 0.7–4.0)
Lymphocytes Relative: 2 %
MCH: 25.4 pg — ABNORMAL LOW (ref 26.0–34.0)
MCHC: 30.8 g/dL (ref 30.0–36.0)
MCV: 82.4 fL (ref 80.0–100.0)
MONOS PCT: 4 %
Monocytes Absolute: 1.1 10*3/uL — ABNORMAL HIGH (ref 0.1–1.0)
NRBC: 0 % (ref 0.0–0.2)
Neutro Abs: 23.3 10*3/uL — ABNORMAL HIGH (ref 1.7–7.7)
Neutrophils Relative %: 91 %
Platelets: 228 10*3/uL (ref 150–400)
RBC: 3.74 MIL/uL — ABNORMAL LOW (ref 3.87–5.11)
RDW: 26.1 % — ABNORMAL HIGH (ref 11.5–15.5)
WBC: 25.6 10*3/uL — ABNORMAL HIGH (ref 4.0–10.5)

## 2018-07-09 LAB — COMPREHENSIVE METABOLIC PANEL
ALBUMIN: 2 g/dL — AB (ref 3.5–5.0)
ALK PHOS: 258 U/L — AB (ref 38–126)
ALT: 73 U/L — ABNORMAL HIGH (ref 0–44)
ANION GAP: 10 (ref 5–15)
AST: 38 U/L (ref 15–41)
BILIRUBIN TOTAL: 9 mg/dL — AB (ref 0.3–1.2)
BUN: 37 mg/dL — AB (ref 6–20)
CALCIUM: 8.4 mg/dL — AB (ref 8.9–10.3)
CO2: 25 mmol/L (ref 22–32)
Chloride: 88 mmol/L — ABNORMAL LOW (ref 98–111)
Creatinine, Ser: 0.87 mg/dL (ref 0.44–1.00)
GFR calc Af Amer: >60 mL/min (ref >60)
GLUCOSE: 204 mg/dL — AB (ref 70–99)
Potassium: 5 mmol/L (ref 3.5–5.1)
Sodium: 123 mmol/L — ABNORMAL LOW (ref 135–145)
Total Protein: 6.9 g/dL (ref 6.5–8.1)

## 2018-07-09 LAB — GLUCOSE, CAPILLARY
GLUCOSE-CAPILLARY: 237 mg/dL — AB (ref 70–99)
GLUCOSE-CAPILLARY: 166 mg/dL — AB (ref 70–99)

## 2018-07-09 LAB — AMMONIA: AMMONIA: 55 umol/L — AB (ref 9–35)

## 2018-07-09 LAB — MAGNESIUM: MAGNESIUM: 2.4 mg/dL (ref 1.7–2.4)

## 2018-07-09 MED ORDER — ACETAMINOPHEN 650 MG RE SUPP: 650.0000 mg | Freq: Four times a day (QID) | RECTAL | Status: DC | PRN

## 2018-07-09 MED ORDER — SODIUM CHLORIDE 0.9 % IV SOLN
INTRAVENOUS | Status: AC
  Administered 2018-07-09 – 2018-07-10 (×2): via INTRAVENOUS

## 2018-07-09 MED ORDER — BOOST PLUS PO LIQD
237.0000 mL | Freq: Three times a day (TID) | ORAL | Status: DC
  Administered 2018-07-10 – 2018-07-11 (×2): 237 mL via ORAL
  Filled 2018-07-09 (×6): qty 237

## 2018-07-09 MED ORDER — PREDNISONE 5 MG PO TABS
5.0000 mg | ORAL_TABLET | Freq: Every day | ORAL | Status: DC
  Administered 2018-07-10 – 2018-07-16 (×7): 5 mg via ORAL
  Filled 2018-07-09 (×7): qty 1

## 2018-07-09 MED ORDER — POLYETHYLENE GLYCOL 3350 17 G PO PACK: 17.0000 g | PACK | Freq: Every day | ORAL | Status: DC | PRN

## 2018-07-09 MED ORDER — MORPHINE SULFATE ER 15 MG PO TBCR
15.0000 mg | EXTENDED_RELEASE_TABLET | Freq: Two times a day (BID) | ORAL | Status: DC
  Administered 2018-07-09 – 2018-07-13 (×8): 15 mg via ORAL
  Filled 2018-07-09 (×8): qty 1

## 2018-07-09 MED ORDER — ACETAMINOPHEN 325 MG PO TABS
650.0000 mg | ORAL_TABLET | Freq: Four times a day (QID) | ORAL | Status: DC | PRN
  Administered 2018-07-13: 650 mg via ORAL
  Filled 2018-07-09 (×2): qty 2

## 2018-07-09 MED ORDER — SODIUM CHLORIDE 0.9 % IV SOLN
2.0000 g | Freq: Two times a day (BID) | INTRAVENOUS | Status: DC
  Administered 2018-07-09 – 2018-07-10 (×2): 2 g via INTRAVENOUS
  Filled 2018-07-09 (×3): qty 2

## 2018-07-09 MED ORDER — INSULIN ASPART 100 UNIT/ML ~~LOC~~ SOLN
0.0000 [IU] | Freq: Three times a day (TID) | SUBCUTANEOUS | Status: DC
  Administered 2018-07-10: 2 [IU] via SUBCUTANEOUS
  Administered 2018-07-10 – 2018-07-11 (×4): 1 [IU] via SUBCUTANEOUS
  Administered 2018-07-11: 3 [IU] via SUBCUTANEOUS
  Administered 2018-07-12: 1 [IU] via SUBCUTANEOUS
  Administered 2018-07-12 – 2018-07-14 (×3): 2 [IU] via SUBCUTANEOUS
  Administered 2018-07-15: 1 [IU] via SUBCUTANEOUS

## 2018-07-09 MED ORDER — HYDROCODONE-ACETAMINOPHEN 5-325 MG PO TABS: 1.0000 | ORAL_TABLET | ORAL | Status: DC | PRN

## 2018-07-09 MED ORDER — FERROUS SULFATE 325 (65 FE) MG PO TABS
325.0000 mg | ORAL_TABLET | Freq: Two times a day (BID) | ORAL | Status: DC
  Administered 2018-07-09 – 2018-07-16 (×14): 325 mg via ORAL
  Filled 2018-07-09 (×14): qty 1

## 2018-07-09 MED ORDER — BISACODYL 5 MG PO TBEC: 5.0000 mg | DELAYED_RELEASE_TABLET | Freq: Every day | ORAL | Status: DC | PRN

## 2018-07-09 MED ORDER — INSULIN ASPART 100 UNIT/ML ~~LOC~~ SOLN: 0.0000 [IU] | Freq: Every day | SUBCUTANEOUS | Status: DC

## 2018-07-09 MED ORDER — MAGNESIUM CITRATE PO SOLN: 0.7500 | Freq: Every day | ORAL | Status: DC | PRN

## 2018-07-09 MED ORDER — LORAZEPAM 0.5 MG PO TABS
0.5000 mg | ORAL_TABLET | Freq: Three times a day (TID) | ORAL | Status: DC | PRN
  Administered 2018-07-12: 0.5 mg via ORAL
  Filled 2018-07-09: qty 1

## 2018-07-09 MED ORDER — ONDANSETRON HCL 4 MG PO TABS: 4.0000 mg | ORAL_TABLET | Freq: Four times a day (QID) | ORAL | Status: DC | PRN

## 2018-07-09 MED ORDER — ONDANSETRON HCL 4 MG/2ML IJ SOLN: 4.0000 mg | Freq: Four times a day (QID) | INTRAMUSCULAR | Status: DC | PRN

## 2018-07-09 MED ORDER — HYDROMORPHONE HCL 4 MG PO TABS
4.0000 mg | ORAL_TABLET | ORAL | Status: DC | PRN
  Administered 2018-07-09: 8 mg via ORAL
  Administered 2018-07-10: 4 mg via ORAL
  Administered 2018-07-10 – 2018-07-12 (×7): 8 mg via ORAL
  Administered 2018-07-13: 4 mg via ORAL
  Administered 2018-07-14 – 2018-07-15 (×5): 8 mg via ORAL
  Administered 2018-07-16 (×3): 4 mg via ORAL
  Filled 2018-07-09 (×2): qty 1
  Filled 2018-07-09: qty 2
  Filled 2018-07-09: qty 1
  Filled 2018-07-09 (×4): qty 2
  Filled 2018-07-09: qty 1
  Filled 2018-07-09 (×7): qty 2
  Filled 2018-07-09: qty 1
  Filled 2018-07-09 (×2): qty 2

## 2018-07-09 NOTE — Progress Notes (Signed)
IP PROGRESS NOTE  Subjective:   Crystal Carlson was seen at the cancer center yesterday.  She had somnolence, anorexia, and reported a low-grade fever the previous day.  She had undergone placement of a new biliary stent on 07/05/2018.  The liver enzymes were improved on 07/08/2018, but the bilirubin remained elevated. We obtained a blood culture yesterday.  The blood culture has returned positive for pseudomonas aeruginosa.  She is now admitted for management of the Pseudomonas bacteremia.  She continues to have abdominal pain.  She reports somnolence.  Her husband has noted confusion.  No fever today.  No chills.  Objective: Vital signs in last 24 hours: Blood pressure 103/78, pulse (!) 109, temperature 98.7 F (37.1 C), temperature source Oral, resp. rate 16, SpO2 100 %.  Intake/Output from previous day: No intake/output data recorded.  Physical Exam:  HEENT: Scleral icterus Lungs: Clear bilaterally, no respiratory distress Cardiac: Regular rate and rhythm Abdomen: Mildly distended, nontender Extremities: No leg edema Neurologic: Alert, follows commands, moves all extremities  Portacath/PICC-without erythema or tenderness  Lab Results: Recent Labs    07/08/18 1112  WBC 25.9*  HGB 9.9*  HCT 30.5*  PLT 182    BMET Recent Labs    07/08/18 1112  NA 126*  K 5.3*  CL 89*  CO2 27  GLUCOSE 234*  BUN 39*  CREATININE 1.25*  CALCIUM 9.3    Lab Results  Component Value Date   CEA1 1.3 05/25/2017    Studies/Results: No results found.  Medications: I have reviewed the patient's current medications.  Assessment/Plan:  1. Pancreascancer, stage IV  MRI abdomen 05/25/2017-pancreas neck mass with encasement of the hepatic/splenic/superior mesenteric arteries, liver lesions, portal/splenic vein thrombosis with cavernous transformation of the portal vein  CT chest 05/25/2017-no evidence of metastatic disease to the chest, pancreas neck mass with vascular encasement and  portacaval adenopathy, liver metastases not visualized  Elevated CA 19-9  Ultrasound-guided biopsy of a left liver lesion 05/26/2017. Adenocarcinoma consistent with pancreatobiliary primary.  Cycle 1 FOLFIRINOX 06/18/2017  Cycle 2FOLFIRINOX 07/02/2017  Cycle 3 FOLFIRINOX 07/16/2017 (oxaliplatin dose reduced due to mild thrombocytopenia)  CT 07/24/2017-new splenomegaly and left hydronephrosis  Cycle 4 FOLFIRINOX 08/08/2017  Cycle 5 FOLFIRINOX 08/24/2017  CT abdomen/pelvis 09/03/2017-stable liver lesions and pancreas mass, stable right biliary dilatation, possible new 8 mm right liver lesion versus a vascular phenomena, lesion at the right paracolic gutter was present in the past-significance unclear  Cycle 6 FOLFIRINOX1/29/2019  Cycle7 FOLFIRINOX2/19/2019 (Irinotecan andOxaliplatin dose reduced due to thrombocytopenia)  CT abdomen/pelvis 10/25/2017- findings concerning for enterocolitis. No bowel obstruction. Ill-defined pancreatic head mass. Multiple hepatic hypoenhancing lesions with the largest measuring 1.9 x 2.0 cm, previously 1.7 x 1.7 cm; 9 x 10 mm hypoenhancing lesion in the inferior right lobe of the liver slightly larger compared to the previous CT; 1.6 x 2.4 cm enhancing lesion in the right paracolic gutter increased in size compared to the prior CT.  Cycle 1 gemcitabine/Abraxane 11/04/2017  Cycle 2 gemcitabine/Abraxane 11/25/2017  Cycle 3 gemcitabine/Abraxane 12/08/2017  Cycle 4 gemcitabine 12/23/2017 (Abraxane held secondary to neuropathy)  CTs abdomen/pelvis 01/12/2018- decrease in size of the pancreatic mass and hepatic metastasis; slight interval decrease in size of right paracolic gutter implant; worsening abdominal/pelvic ascites. Bowel wall thickening likely due to ascites and low albumin. 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  Cycle 5 gemcitabine 01/13/2018 (Abraxane held secondary to neuropathy)  Cycle 6 gemcitabine/Abraxane  01/27/2018  CT abdomen/pelvis 02/01/2018- dominant metastases in the liver are unchanged,  multiple new low-density lesions throughout the right lobe-small abscesses?, No change in pancreas mass  Cycle 7 gemcitabine/Abraxane 02/16/2018(gemcitabine and Abraxane dose reduced due to previous neutropenia)  Cycle 8 gemcitabine/Abraxane 03/03/2018  Cycle 9 gemcitabine/Abraxane 03/25/2018  Cycle 10 gemcitabine/Abraxane 04/14/2018  Restaging CTs 05/04/2018- similar ill-defined pancreatic mass; multiple liver metastases again noted, appears slightly improved; increase in volume of abdominal and pelvic ascites; diffuse small bowel wall edema and mild edema of the ascending colon and transverse colon.  Cycle 11 gemcitabine/Abraxane 05/05/2018  Cycle 12 gemcitabine/Abraxane 06/02/2018  CT 06/16/2018-stable pancreas mass extending into retroperitoneal soft tissue, multiple low-density lesions in the right liver-increased in size probable superior mesenteric vein thrombosis, mild left hydronephrosis  Ultrasound aspiration of a liver lesion 06/22/2018-negative cytology and culture   2. Pain secondary to #1.Improved.  3. Weight losssecondary to #1.  4. Microcytic anemia  Transfused with packed red blood cells 07/25/2017, 16 2019, and 11/27/2017  5. History of thrombocytopenia secondary to chemotherapy  6. Port-A-Cath placement 06/04/2017  7. Elevated liver enzymes/bilirubin10/22/2018-likely early biliary obstruction secondary to pancreas cancer, status post ERCP-placement of a common bile duct stent 06/09/2018  8.Postprandial abdominal pain. Protonix initiated 07/16/2017.Trial of Reglan initiated 08/24/2017.  9.Admission with fever 07/24/2017-no source for infection identified, resolved   10.Diarrheafollowing cycle 3 FOLFIRINOX-resolved  11.Left hydronephrosis noted on CT12/03/2017-etiology unclear, urology consulted-stent not recommended.Mild left hydronephrosis  noted on CT 10/25/2017. Creatinine normal 10/27/2017.  12. Admission 08/31/2017 with a high fever, blood culture positive for gram-negative rods-Klebsiella pneumoniae identified  13.Fever 10/25/2017 status post evaluation in the emergency department. CT with findings concerning for enterocolitis.   14. Admission 11/11/2017 with a high fever-blood cultures (peripheral and Port-A-Cath) positive for gram-negative rods--STENOTROPHOMONAS MALTOPHILIA;course of Septra completed as an outpatient.  15. Rash-potentially secondary to gemcitabine or infection, improved  16. Pancytopenia secondary to chemotherapy  17.Intermittent fever. Question "tumor fever", question infection. Now on Bactrim prophylaxis, improved  18. Ascites.Paracentesis 01/14/2018- reactive mesothelial cells, mixed inflammation;culture positive for Klebsiella and enterococcustreated withAugmentin  Admission with fever 01/30/2018, blood cultures negative, discharged on ciprofloxacin/Flagyl  Recurrent ascites, paracentesis 02/23/2018-culture positive for strep viridans, placed on Levaquin  Paracentesis 03/03/2018- 2 L of fluid removed, culture and cytology negative  Trialof Aldactone/Lasix started 05/26/2018  19.Status post evaluation back pain in the emergency department 04/23/2018-MRI lumbar spine with L5-S1 left subarticular disc extrusion  L5-S1laminotomyand microdiscectomy 05/17/2018, pain resolved  20. Admission 06/16/2018 with fever and failure to thrive- cultures negative, suspect liver abscesses 21.  Elevated liver enzymes and bilirubin 07/02/2018  ERCP 07/05/2018- migration of previously noted biliary stent with dilated biliary tree, new uncovered metal stent placed in the common bile duct 22.  Failure to thrive 07/08/2018- blood culture positive for pseudomonas aeruginosa  Crystal Carlson has metastatic pancreas cancer.  She is currently being treated with gemcitabine/Abraxane chemotherapy, last given  on 06/02/2018.  Chemotherapy has been on hold over the past month secondary to admission with a febrile illness 3 weeks ago, hyperbilirubinemia, and now Pseudomonas bacteremia.  We have not documented clear progression of the pancreas cancer.  The elevated CA 19-9 is likely related to inflammation of the biliary tract.  The Pseudomonas bacteremia is likely related to seeding from the biliary tract.  I have a low clinical suspicion for a primary Port-A-Cath infection.  Recommendations: 1.  Follow-up liver enzymes/bilirubin following stent placement 07/05/2018 2.  Broad spectrum intravenous antibiotics, follow-up on ID/sensitivity of the pseudomonas aeruginosa from the 07/08/2018 blood culture  3.  Continue narcotic analgesics for pain  Please call oncology  as needed over the weekend.  I will check on her 07/12/2018.  I appreciate the care from the internal medicine and infectious disease services.      LOS: 0 days   Betsy Coder, MD   07/09/2018, 5:38 PM

## 2018-07-09 NOTE — Telephone Encounter (Signed)
Notified husband that patient's room is ready now and to leave and report to [place] department.

## 2018-07-09 NOTE — Progress Notes (Signed)
Pharmacy Antibiotic Note  Crystal Carlson is a 52 y.o. female with ah/o stage IV metastatic pancreatic cancer on chemotherapy.  admitted on 07/09/2018 with Pseudomonal bacteremia.  Pharmacy has been consulted for cefepime dosing.  Plan: Cefepime 2 g iv q 12h.      No data recorded.  Recent Labs  Lab 07/08/18 1112  WBC 25.9*  CREATININE 1.25*    Estimated Creatinine Clearance: 37.9 mL/min (A) (by C-G formula based on SCr of 1.25 mg/dL (H)).    Allergies  Allergen Reactions  . Pork-Derived Products Other (See Comments)    RELIGIOUS BELIEF -  NO PORK    Antimicrobials this admission: 11/22 cefepime >>  Dose adjustments this admission:  Microbiology results: Outpatient:  11/21 BCx: GNR 2/2, BCID Pseudomonas  This admission BCx:    Thank you for allowing pharmacy to be a part of this patient's care.  Crystal Carlson 07/09/2018 5:18 PM

## 2018-07-09 NOTE — Telephone Encounter (Signed)
Notified by microbacterial lab that blood culture returned positive for pseudomonas aeruginosa. Dr. Benay Spice notified and is working on admission with hospitalist, Dr. Reesa Chew to follow. Awaiting AC to locate bed at [facility].  Husband notified of need for admission and rationale. He will have her ready when there is a bed.

## 2018-07-09 NOTE — H&P (Signed)
History and Physical    Yaiza Palazzola JJO:[id number] DOB: [birth year] DOA: 07/09/2018  PCP: Mack Hook, MD Patient coming from: Home  Chief Complaint: Pseudomonal bacteremia  HPI: Crystal Carlson is a 52 y.o. female with medical history significant of stage IV metastatic pancreatic cancer on chemotherapy, malignant ascites, prediabetes, iron deficiency anemia, elevated bilirubin was sent to the hospital for positive blood cultures outpatient.  Patient was admitted here few weeks ago for subjective fevers and chills with worsening of her metastases.  She was found to have possible liver abscess and then eventually discharged on oral antibiotics.  Recently outpatient she was noted to have elevated bilirubin for which her bile duct stent was replaced earlier this week by Dr. Watt Climes.  Despite of this her bilirubin increased and she had low-grade temperatures.  Outpatient blood culture showed pseudomonal bacteremia therefore case was discussed by Dr. Learta Codding with Dr. Johnnye Sima from infectious disease who recommended admitting the patient to the hospital and started on IV cefepime.  Her pain medications were increased this morning by Dr. Learta Codding.  MS Contin 30 mg in the morning and 15 mg in the evening. When I saw the patient at bedside she overall appeared generally weak but did not have any other new complaints.  Her port site looks okay. Husband is at the bedside who reports to me that patient has had off-and-on low-grade temperatures at home but no other symptoms.  Review of Systems: As per HPI otherwise 10 point review of systems negative.  Review of Systems Otherwise negative except as per HPI, including: General: Denies fever, chills, night sweats or unintended weight loss. Resp: Denies cough, wheezing, shortness of breath. Cardiac: Denies chest pain, palpitations, orthopnea, paroxysmal nocturnal dyspnea. GI: Denies abdominal pain, nausea, vomiting, diarrhea or constipation GU: Denies dysuria,  frequency, hesitancy or incontinence MS: Denies muscle aches, joint pain or swelling Neuro: Denies headache, neurologic deficits (focal weakness, numbness, tingling), abnormal gait Psych: Denies anxiety, depression, SI/HI/AVH Skin: Denies new rashes or lesions ID: Denies sick contacts, exotic exposures, travel  Past Medical History:  Diagnosis Date  . Anxiety   . Cancer of pancreas ([place]) 05/26/2017   chemo  . Chronic pancreatitis ([place]) 05/19/2017  . Depression   . Edema    LE  . Liver metastases ([place]) 05/26/2017  . Microcytic anemia 05/19/2017  . Prediabetes 2016   when living in [place]    Past Surgical History:  Procedure Laterality Date  . BILIARY STENT PLACEMENT N/A 07/05/2018   Procedure: BILIARY STENT PLACEMENT;  Surgeon: Clarene Essex, MD;  Location: [facility];  Service: Endoscopy;  Laterality: N/A;  . ENDOSCOPIC RETROGRADE CHOLANGIOPANCREATOGRAPHY (ERCP) WITH PROPOFOL N/A 07/05/2018   Procedure: ENDOSCOPIC RETROGRADE CHOLANGIOPANCREATOGRAPHY (ERCP) WITH PROPOFOL;  Surgeon: Clarene Essex, MD;  Location: [facility];  Service: Endoscopy;  Laterality: N/A;  with Balloon Sweep for Stones  . ERCP N/A 06/09/2017   Procedure: ENDOSCOPIC RETROGRADE CHOLANGIOPANCREATOGRAPHY (ERCP);  Surgeon: Clarene Essex, MD;  Location: [facility];  Service: Endoscopy;  Laterality: N/A;  . IR FLUORO GUIDE PORT INSERTION RIGHT  06/04/2017  . IR PARACENTESIS  04/13/2018  . IR PARACENTESIS  04/30/2018  . IR PARACENTESIS  05/12/2018  . IR US GUIDE VASC ACCESS RIGHT  06/04/2017  . LUMBAR LAMINECTOMY/DECOMPRESSION MICRODISCECTOMY Left 05/17/2018   Procedure: Left Lumbar Five-Sacral One Microdiscectomy;  Surgeon: Earnie Larsson, MD;  Location: [facility];  Service: Neurosurgery;  Laterality: Left;  . SPHINCTEROTOMY  07/05/2018   Procedure: SPHINCTEROTOMY;  Surgeon: Clarene Essex, MD;  Location: [facility];  Service: Endoscopy;;  SOCIAL HISTORY:  reports that she has never smoked. She has never used smokeless  tobacco. She reports that she does not drink alcohol or use drugs.  Allergies  Allergen Reactions  . Pork-Derived Products     RELIGIOUS BELIEF -  NO PORK    FAMILY HISTORY: Family History  Problem Relation Age of Onset  . Hypertension Mother   . Arthritis Mother   . Diabetes Father   . Heart disease Father        ?valvular problems  . Hyperlipidemia Son   . Diabetes Brother      Prior to Admission medications   Medication Sig Start Date End Date Taking? Authorizing Provider  amoxicillin-clavulanate (AUGMENTIN) 875-125 MG tablet Take 1 tablet by mouth 2 (two) times daily. 06/24/18 08/23/18  Danford, Suann Larry, MD  ferrous sulfate 325 (65 FE) MG EC tablet Take 1 tablet (325 mg total) by mouth 3 (three) times daily with meals. Patient taking differently: Take 325 mg by mouth daily at 2 PM.  06/18/17   Ladell Pier, MD  hydrocortisone cream 1 % Apply 1 application topically 3 (three) times daily as needed for itching (minor skin irritation). 07/27/17   Theodis Blaze, MD  HYDROmorphone (DILAUDID) 4 MG tablet Take 1-2 tablets (4-8 mg total) by mouth every 4 (four) hours as needed for severe pain. 07/07/18   Owens Shark, NP  lactose free nutrition (BOOST PLUS) LIQD Take 237 mLs by mouth 3 (three) times daily with meals. 06/24/18   Danford, Suann Larry, MD  lidocaine-prilocaine (EMLA) cream Apply to port site one hour prior to use. Do not rub in. Cover with plastic. Patient taking differently: Apply 1 application topically daily as needed (prior to chemotherapy). Apply to port site one hour prior to use. Do not rub in. Cover with plastic. 11/17/17   Owens Shark, NP  LORazepam (ATIVAN) 0.5 MG tablet Take 1 tablet (0.5 mg total) by mouth every 8 (eight) hours as needed for anxiety (or nausea). 06/30/18   Ladell Pier, MD  magnesium citrate SOLN Take 0.75 Bottles by mouth once.    [provider]  morphine (MS CONTIN) 15 MG 12 hr tablet Take 1 tablet (15 mg total) by  mouth every 12 (twelve) hours. 04/14/18   Ladell Pier, MD  predniSONE (DELTASONE) 5 MG tablet Take 1 tablet (5 mg total) by mouth daily. 06/30/18   Ladell Pier, MD    Physical Exam: There were no vitals filed for this visit.    Constitutional: NAD, calm, comfortable, generally appears very weak, bilateral temporal wasting with cachexia. Eyes: PERRL, lids and conjunctivae normal ENMT: Mucous membranes are dry posterior pharynx clear of any exudate or lesions.Normal dentition.  Neck: normal, supple, no masses, no thyromegaly Respiratory: clear to auscultation bilaterally, no wheezing, no crackles. Normal respiratory effort. No accessory muscle use.  Cardiovascular: Regular rate and rhythm, no murmurs / rubs / gallops. No extremity edema. 2+ pedal pulses. No carotid bruits.  Abdomen: no tenderness, no masses palpated. No hepatosplenomegaly. Bowel sounds positive.  Musculoskeletal: no clubbing / cyanosis. No joint deformity upper and lower extremities. Good ROM, no contractures. Normal muscle tone.  Skin: no rashes, lesions, ulcers. No induration Neurologic: CN 2-12 grossly intact. Sensation intact, DTR normal. Strength 4/5 in all 4.  Psychiatric: Normal judgment and insight. Alert and oriented x 3. Normal mood.     Labs on Admission: I have personally reviewed following labs and imaging studies  CBC: Recent Labs  Lab 07/08/18 1112  WBC 25.9*  NEUTROABS 23.8*  HGB 9.9*  HCT 30.5*  MCV 80.1  PLT 470   Basic Metabolic Panel: Recent Labs  Lab 07/08/18 1112  NA 126*  K 5.3*  CL 89*  CO2 27  GLUCOSE 234*  BUN 39*  CREATININE 1.25*  CALCIUM 9.3   GFR: Estimated Creatinine Clearance: 37.9 mL/min (A) (by C-G formula based on SCr of 1.25 mg/dL (H)). Liver Function Tests: Recent Labs  Lab 07/08/18 1112  AST 52*  ALT 103*  ALKPHOS 332*  BILITOT 9.0*  PROT 7.3  ALBUMIN 2.1*   No results for input(s): LIPASE, AMYLASE in the last 168 hours. No results for  input(s): AMMONIA in the last 168 hours. Coagulation Profile: No results for input(s): INR, PROTIME in the last 168 hours. Cardiac Enzymes: No results for input(s): CKTOTAL, CKMB, CKMBINDEX, TROPONINI in the last 168 hours. BNP (last 3 results) No results for input(s): PROBNP in the last 8760 hours. HbA1C: No results for input(s): HGBA1C in the last 72 hours. CBG: No results for input(s): GLUCAP in the last 168 hours. Lipid Profile: No results for input(s): CHOL, HDL, LDLCALC, TRIG, CHOLHDL, LDLDIRECT in the last 72 hours. Thyroid Function Tests: No results for input(s): TSH, T4TOTAL, FREET4, T3FREE, THYROIDAB in the last 72 hours. Anemia Panel: No results for input(s): VITAMINB12, FOLATE, FERRITIN, TIBC, IRON, RETICCTPCT in the last 72 hours. Urine analysis:    Component Value Date/Time   COLORURINE YELLOW 06/16/2018 [place] 06/16/2018 1706   LABSPEC 1.008 06/16/2018 1706   LABSPEC 1.020 07/24/2017 0847   PHURINE 6.0 06/16/2018 1706   GLUCOSEU NEGATIVE 06/16/2018 1706   GLUCOSEU Negative 07/24/2017 0847   HGBUR NEGATIVE 06/16/2018 1706   BILIRUBINUR NEGATIVE 06/16/2018 1706   BILIRUBINUR Negative 07/24/2017 0847   KETONESUR NEGATIVE 06/16/2018 1706   PROTEINUR NEGATIVE 06/16/2018 1706   UROBILINOGEN 0.2 07/24/2017 0847   NITRITE NEGATIVE 06/16/2018 1706   LEUKOCYTESUR NEGATIVE 06/16/2018 1706   LEUKOCYTESUR Negative 07/24/2017 0847   Sepsis Labs: !!!!!!!!!!!!!!!!!!!!!!!!!!!!!!!!!!!!!!!!!!!! @LABRCNTIP (procalcitonin:4,lacticidven:4) ) Recent Results (from the past 240 hour(s))  Culture, Blood     Status: None (Preliminary result)   Collection Time: 07/08/18 11:12 AM  Result Value Ref Range Status   Specimen Description BLOOD PORTA CATH RIGHT  Final   Special Requests   Final    BOTTLES DRAWN AEROBIC AND ANAEROBIC Blood Culture adequate volume   Culture  Setup Time   Final    GRAM NEGATIVE RODS IN BOTH AEROBIC AND ANAEROBIC BOTTLES Organism ID to  follow CRITICAL RESULT CALLED TO, READ BACK BY AND VERIFIED WITH: S CARDWELL RN [id number] BY GF    Culture   Final    NO GROWTH 1 DAY Performed at [facility], [place] [address] [place], [place] [address]    Report Status PENDING  Incomplete  Blood Culture ID Panel (Reflexed)     Status: Abnormal   Collection Time: 07/08/18 11:12 AM  Result Value Ref Range Status   Enterococcus species NOT DETECTED NOT DETECTED Final   Listeria monocytogenes NOT DETECTED NOT DETECTED Final   Staphylococcus species NOT DETECTED NOT DETECTED Final   Staphylococcus aureus (BCID) NOT DETECTED NOT DETECTED Final   Streptococcus species NOT DETECTED NOT DETECTED Final   Streptococcus agalactiae NOT DETECTED NOT DETECTED Final   Streptococcus pneumoniae NOT DETECTED NOT DETECTED Final   Streptococcus pyogenes NOT DETECTED NOT DETECTED Final   Acinetobacter baumannii NOT DETECTED NOT DETECTED Final   Enterobacteriaceae  species NOT DETECTED NOT DETECTED Final   Enterobacter cloacae complex NOT DETECTED NOT DETECTED Final   Escherichia coli NOT DETECTED NOT DETECTED Final   Klebsiella oxytoca NOT DETECTED NOT DETECTED Final   Klebsiella pneumoniae NOT DETECTED NOT DETECTED Final   Proteus species NOT DETECTED NOT DETECTED Final   Serratia marcescens NOT DETECTED NOT DETECTED Final   Carbapenem resistance NOT DETECTED NOT DETECTED Final   Haemophilus influenzae NOT DETECTED NOT DETECTED Final   Neisseria meningitidis NOT DETECTED NOT DETECTED Final   Pseudomonas aeruginosa DETECTED (A) NOT DETECTED Final    Comment: CRITICAL RESULT CALLED TO, READ BACK BY AND VERIFIED WITH: S CAEDWELL RN [phone] BY GF    Candida albicans NOT DETECTED NOT DETECTED Final   Candida glabrata NOT DETECTED NOT DETECTED Final   Candida krusei NOT DETECTED NOT DETECTED Final   Candida parapsilosis NOT DETECTED NOT DETECTED Final   Candida tropicalis NOT DETECTED NOT DETECTED Final    Comment: Performed at [facility], [place] [address] [place], [place] [address]     Radiological Exams on Admission: No results found.   All images have been reviewed by me personally.   Assessment/Plan Principal Problem:   Pseudomonal bacteremia Active Problems:   Chronic pancreatitis (HCC)   Microcytic anemia   HTN (hypertension)   Elevated LFTs   Pancreatic cancer metastasized to liver (HCC)   Cancer related pain   Protein-calorie malnutrition, moderate (HCC)   Thrombocytopenia (HCC)   Cancer of head of pancreas (HCC)   Sepsis ([place])    Pseudomonal bacteremia -Admit the patient to the hospital for further care.  Patient needs to be started on IV cefepime. -We will draw blood cultures from peripheral site and the port site -Case discussed with Dr. Johnnye Sima from infectious disease who will follow with the patient tomorrow.  Patient seen by Dr. Learta Codding as well. -Per infectious disease, no need to take the port out at this time.  But if it becomes necessary we will proceed with that later on.  Acute kidney injury, prerenal - Baseline creatinine 0.7.  Today is 1.2.  Suspect due to dehydration, will be getting gentle hydration.  Monitor urine output.  Mild acute metabolic encephalopathy Stage IV metastatic pancreatic cancer Elevated total bilirubin and transaminitis -Continue to trend LFTs.  Recently exchanged biliary stent. -Check ammonia level. -Chemo plan per oncology team.  Severe protein calorie malnutrition -Boost as needed, nutrition consult  Anemia of chronic disease and thrombocytopenia -Malignancy related.Iron Supp - continue.    DVT prophylaxis: Hep SQ Code Status: Full Code  Family Communication: Husband and mother at bedside  Disposition Plan:  TBD Consults called: Heme/Onc, Infectious Disease  Admission status: Inpatient admission for IV Abx.    Time Spent: 65 minutes.  >50% of the time was devoted to discussing the patients care, assessment, plan and disposition with other care  givers along with counseling the patient about the risks and benefits of treatment.    Vondell Sowell Arsenio Loader MD [facility] Pager [phone]  If 7PM-7AM, please contact night-coverage [email or web address] Password [facility]  07/09/2018, 4:45 PM

## 2018-07-10 ENCOUNTER — Other Ambulatory Visit: Payer: Self-pay

## 2018-07-10 DIAGNOSIS — R7881 Bacteremia: Secondary | ICD-10-CM

## 2018-07-10 DIAGNOSIS — C250 Malignant neoplasm of head of pancreas: Secondary | ICD-10-CM

## 2018-07-10 DIAGNOSIS — C787 Secondary malignant neoplasm of liver and intrahepatic bile duct: Secondary | ICD-10-CM

## 2018-07-10 DIAGNOSIS — C259 Malignant neoplasm of pancreas, unspecified: Secondary | ICD-10-CM

## 2018-07-10 DIAGNOSIS — K861 Other chronic pancreatitis: Secondary | ICD-10-CM

## 2018-07-10 LAB — CBC
HCT: 25.6 % — ABNORMAL LOW (ref 36.0–46.0)
Hemoglobin: 8 g/dL — ABNORMAL LOW (ref 12.0–15.0)
MCH: 26.1 pg (ref 26.0–34.0)
MCHC: 31.3 g/dL (ref 30.0–36.0)
MCV: 83.4 fL (ref 80.0–100.0)
NRBC: 0 % (ref 0.0–0.2)
Platelets: 176 10*3/uL (ref 150–400)
RBC: 3.07 MIL/uL — ABNORMAL LOW (ref 3.87–5.11)
RDW: 26.3 % — AB (ref 11.5–15.5)
WBC: 20.8 10*3/uL — AB (ref 4.0–10.5)

## 2018-07-10 LAB — URINALYSIS, ROUTINE W REFLEX MICROSCOPIC
Glucose, UA: NEGATIVE mg/dL
Hgb urine dipstick: NEGATIVE
KETONES UR: 5 mg/dL — AB
Leukocytes, UA: NEGATIVE
NITRITE: NEGATIVE
PROTEIN: NEGATIVE mg/dL
SPECIFIC GRAVITY, URINE: 1.018 (ref 1.005–1.030)
pH: 6 (ref 5.0–8.0)

## 2018-07-10 LAB — COMPREHENSIVE METABOLIC PANEL
ALBUMIN: 1.8 g/dL — AB (ref 3.5–5.0)
ALK PHOS: 212 U/L — AB (ref 38–126)
ALT: 56 U/L — AB (ref 0–44)
ANION GAP: 7 (ref 5–15)
AST: 29 U/L (ref 15–41)
BILIRUBIN TOTAL: 7.2 mg/dL — AB (ref 0.3–1.2)
BUN: 31 mg/dL — ABNORMAL HIGH (ref 6–20)
CALCIUM: 8.1 mg/dL — AB (ref 8.9–10.3)
CO2: 25 mmol/L (ref 22–32)
Chloride: 94 mmol/L — ABNORMAL LOW (ref 98–111)
Creatinine, Ser: 0.71 mg/dL (ref 0.44–1.00)
GFR calc Af Amer: >60 mL/min (ref >60)
GFR calc non Af Amer: >60 mL/min (ref >60)
GLUCOSE: 139 mg/dL — AB (ref 70–99)
Potassium: 4.5 mmol/L (ref 3.5–5.1)
Sodium: 126 mmol/L — ABNORMAL LOW (ref 135–145)
Total Protein: 5.8 g/dL — ABNORMAL LOW (ref 6.5–8.1)

## 2018-07-10 LAB — GLUCOSE, CAPILLARY
GLUCOSE-CAPILLARY: 131 mg/dL — AB (ref 70–99)
GLUCOSE-CAPILLARY: 188 mg/dL — AB (ref 70–99)
Glucose-Capillary: 133 mg/dL — ABNORMAL HIGH (ref 70–99)
Glucose-Capillary: 154 mg/dL — ABNORMAL HIGH (ref 70–99)

## 2018-07-10 LAB — PROTIME-INR
INR: 1.62
Prothrombin Time: 19.1 seconds — ABNORMAL HIGH (ref 11.4–15.2)

## 2018-07-10 LAB — APTT: APTT: 28 s (ref 24–36)

## 2018-07-10 MED ORDER — SODIUM CHLORIDE 0.9 % IV SOLN
2.0000 g | Freq: Three times a day (TID) | INTRAVENOUS | Status: DC
  Administered 2018-07-10 – 2018-07-16 (×18): 2 g via INTRAVENOUS
  Filled 2018-07-10 (×20): qty 2

## 2018-07-10 NOTE — Progress Notes (Addendum)
Pharmacy Antibiotic Note  Crystal Carlson is a 52 y.o. female with metastatic pancreatic cancer on chemotherapy admitted on 07/09/2018 with Pseudomonal bacteremia.  Pharmacy has been consulted for cefepime dosing.  Plan: Increase Cefepime to 2g IV q8h with improved renal function. Monitor renal function, cultures, clinical course.    Height: 5\' 5"  (165.1 cm) Weight: 107 lb (48.5 kg) IBW/kg (Calculated) : 57  Temp (24hrs), Avg:98.6 F (37 C), Min:98.3 F (36.8 C), Max:98.9 F (37.2 C)  Recent Labs  Lab 07/08/18 1112 07/09/18 1717 07/10/18 0600  WBC 25.9* 25.6* 20.8*  CREATININE 1.25* 0.87 0.71    Estimated Creatinine Clearance: 64.4 mL/min (by C-G formula based on SCr of 0.71 mg/dL).    Allergies  Allergen Reactions  . Pork-Derived Products Other (See Comments)    RELIGIOUS BELIEF -  NO PORK    Antimicrobials this admission: 11/22 cefepime >>  Microbiology results: Outpatient at [facility]:  11/21 BCx from port-a-cath: GNR in aerobic and anaerobic bottles, BCID = Pseudomonas aeruginosa  This admission 11/22 BCx: GNR 1/2 sets   Thank you for allowing pharmacy to be a part of this patient's care.   Lindell Spar, PharmD, BCPS Pager: [phone] 07/10/2018 1:48 PM

## 2018-07-10 NOTE — Consult Note (Signed)
[facility] for Infectious Disease    Date of Admission:  07/09/2018   Total days of antibiotics: 1 cefepime               Reason for Consult: Pseudomonas bacteremia    Referring Provider: Benay Spice   Assessment: Pseudomonas bacteremia  Plan: 1. Continue cefepime 2. Try to maintain her port if possible, pseudomonas is considered an indication for port removal.  3. Repeat BCx in AM 4. Nutrition 5. Could consider repeat CT abdomen to eval for source.  6. Check UA  Thank you so much for this interesting consult,  Principal Problem:   Pseudomonal bacteremia Active Problems:   Chronic pancreatitis (HCC)   Microcytic anemia   HTN (hypertension)   Elevated LFTs   Pancreatic cancer metastasized to liver [facility])   Cancer related pain   Protein-calorie malnutrition, moderate (HCC)   Thrombocytopenia (HCC)   Cancer of head of pancreas ([place])   Sepsis ([place])   . ferrous sulfate  325 mg Oral BID WC  . insulin aspart  0-5 Units Subcutaneous QHS  . insulin aspart  0-9 Units Subcutaneous TID WC  . lactose free nutrition  237 mL Oral TID WC  . morphine  15 mg Oral Q12H  . predniSONE  5 mg Oral Q breakfast    HPI: Crystal Carlson is a 52 y.o. female with hx of pancreatic CA (dx 05-2017) with mets to liver (last CTX 10-16/ cycle 12 gemcitabine-abraxane), came to hospital on 10-30 with anorexia, f/c (101).  Her CT on adm showed: *Grossly stable ill-defined mass is seen arising from the pancreas measuring 6.8 x 3.5 cm, which extends into surrounding retroperitoneal soft tissues. *Multiple low densities are noted in the right hepatic lobe which are increased in size compared to prior exam and consistent with worsening metastatic disease. *Biliary stent is again noted. Continued wall thickening of duodenum and proximal jejunum is noted concerning for inflammation or edema. 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 *Stable moderate splenomegaly. *Mild left  hydronephrosis is noted without obstructing calculus; potentially there may be ureteral occlusion secondary to retroperitoneal neoplasm.  She had liver aspirate that did not show malignant cells. Her Cx was (-) as were her BCx. She was treated as liver abscess and was sent home on po augmentin.  She was seen in her oncologists office 11-21 for f/c and had BCx drawn. These have since resulted as 2/2  pseudomonas.  Her repeat BCx are 1/2 GNR on 11-22 (one was drawn from her port).   Review of Systems: Review of Systems  Constitutional: Positive for chills, fever and weight loss.  Gastrointestinal: Positive for abdominal pain.  Please see HPI. All other systems reviewed and negative.   Past Medical History:  Diagnosis Date  . Anxiety   . Cancer of pancreas ([place]) 05/26/2017   chemo  . Chronic pancreatitis ([place]) 05/19/2017  . Depression   . Edema    LE  . Liver metastases ([place]) 05/26/2017  . Microcytic anemia 05/19/2017  . Prediabetes 2016   when living in [place] Use  . Smoking status: Never Smoker  . Smokeless tobacco: Never Used  Substance Use Topics  . Alcohol use: No  . Drug use: No    Family History  Problem Relation Age of Onset  . Hypertension Mother   . Arthritis Mother   . Diabetes Father   . Heart disease Father        ?  valvular problems  . Hyperlipidemia Son   . Diabetes Brother      Medications:  Scheduled: . ferrous sulfate  325 mg Oral BID WC  . insulin aspart  0-5 Units Subcutaneous QHS  . insulin aspart  0-9 Units Subcutaneous TID WC  . lactose free nutrition  237 mL Oral TID WC  . morphine  15 mg Oral Q12H  . predniSONE  5 mg Oral Q breakfast    Abtx:  Anti-infectives (From admission, onward)   Start     Dose/Rate Route Frequency Ordered Stop   07/10/18 1400  ceFEPIme (MAXIPIME) 2 g in sodium chloride 0.9 % 100 mL IVPB     2 g 200 mL/hr over 30 Minutes Intravenous Every 8 hours 07/10/18 1344     07/09/18 1730   ceFEPIme (MAXIPIME) 2 g in sodium chloride 0.9 % 100 mL IVPB  Status:  Discontinued     2 g 200 mL/hr over 30 Minutes Intravenous Every 12 hours 07/09/18 1649 07/10/18 1344        OBJECTIVE: Blood pressure 99/77, pulse 100, temperature 98 F (36.7 C), temperature source Oral, resp. rate 18, height '5\' 5"'$  (1.651 m), weight 48.5 kg, SpO2 100 %.  Physical Exam  Constitutional: She appears cachectic. She appears ill. No distress.  Eyes: Pupils are equal, round, and reactive to light. EOM are normal. Scleral icterus is present.  Neck: Neck supple.  Cardiovascular: Normal rate, regular rhythm and normal heart sounds.  Pulmonary/Chest: Effort normal and breath sounds normal.  There is no fluctuance around her port. It is non-tender.   Abdominal: Soft. Bowel sounds are normal. She exhibits no distension. There is no tenderness.  Lymphadenopathy:    She has no cervical adenopathy.  Neurological: She is alert.  Psychiatric: She has a normal mood and affect.    Lab Results Results for orders placed or performed during the hospital encounter of 07/09/18 (from the past 48 hour(s))  Glucose, capillary     Status: Abnormal   Collection Time: 07/09/18  4:44 PM  Result Value Ref Range   Glucose-Capillary 237 (H) 70 - 99 mg/dL  Comprehensive metabolic panel     Status: Abnormal   Collection Time: 07/09/18  5:17 PM  Result Value Ref Range   Sodium 123 (L) 135 - 145 mmol/L   Potassium 5.0 3.5 - 5.1 mmol/L   Chloride 88 (L) 98 - 111 mmol/L   CO2 25 22 - 32 mmol/L   Glucose, Bld 204 (H) 70 - 99 mg/dL   BUN 37 (H) 6 - 20 mg/dL   Creatinine, Ser 0.87 0.44 - 1.00 mg/dL   Calcium 8.4 (L) 8.9 - 10.3 mg/dL   Total Protein 6.9 6.5 - 8.1 g/dL   Albumin 2.0 (L) 3.5 - 5.0 g/dL   AST 38 15 - 41 U/L   ALT 73 (H) 0 - 44 U/L   Alkaline Phosphatase 258 (H) 38 - 126 U/L   Total Bilirubin 9.0 (H) 0.3 - 1.2 mg/dL   GFR calc non Af Amer >60 >60 mL/min   GFR calc Af Amer >60 >60 mL/min    Comment:  (NOTE) The eGFR has been calculated using the CKD EPI equation. This calculation has not been validated in all clinical situations. eGFR's persistently <60 mL/min signify possible Chronic Kidney Disease.    Anion gap 10 5 - 15    Comment: Performed at [facility], [place] [address] [place], [place] [address]  Magnesium     Status:  None   Collection Time: 07/09/18  5:17 PM  Result Value Ref Range   Magnesium 2.4 1.7 - 2.4 mg/dL    Comment: Performed at [facility], [place] [address] [place] [address] [place], [place] [address]  CBC WITH DIFFERENTIAL     Status: Abnormal   Collection Time: 07/09/18  5:17 PM  Result Value Ref Range   WBC 25.6 (H) 4.0 - 10.5 K/uL   RBC 3.74 (L) 3.87 - 5.11 MIL/uL   Hemoglobin 9.5 (L) 12.0 - 15.0 g/dL   HCT 30.8 (L) 36.0 - 46.0 %   MCV 82.4 80.0 - 100.0 fL   MCH 25.4 (L) 26.0 - 34.0 pg   MCHC 30.8 30.0 - 36.0 g/dL   RDW 26.1 (H) 11.5 - 15.5 %   Platelets 228 150 - 400 K/uL   nRBC 0.0 0.0 - 0.2 %   Neutrophils Relative % 91 %   Neutro Abs 23.3 (H) 1.7 - 7.7 K/uL   Lymphocytes Relative 2 %   Lymphs Abs 0.5 (L) 0.7 - 4.0 K/uL   Monocytes Relative 4 %   Monocytes Absolute 1.1 (H) 0.1 - 1.0 K/uL   Eosinophils Relative 1 %   Eosinophils Absolute 0.3 0.0 - 0.5 K/uL   Basophils Relative 0 %   Basophils Absolute 0.0 0.0 - 0.1 K/uL   WBC Morphology WHITE COUNT CONFIRMED ON SMEAR    Immature Granulocytes 2 %   Abs Immature Granulocytes 0.40 (H) 0.00 - 0.07 K/uL   Polychromasia PRESENT    Target Cells PRESENT     Comment: Performed at [facility], [place] [address] [place], [place] [address]  Culture, blood (Routine X 2) w Reflex to ID Panel     Status: None (Preliminary result)   Collection Time: 07/09/18  5:18 PM  Result Value Ref Range   Specimen Description      BLOOD BLOOD LEFT ARM Performed at [place] [address] [place], [place] [address]    Special Requests      BOTTLES DRAWN  AEROBIC AND ANAEROBIC Blood Culture adequate volume Performed at [place] [address] [place], [place] [address]    Culture      NO GROWTH < 24 HOURS Performed at [place] [address] [place], [place] [address]    Report Status PENDING   Ammonia     Status: Abnormal   Collection Time: 07/09/18  5:22 PM  Result Value Ref Range   Ammonia 55 (H) 9 - 35 umol/L    Comment: Performed at [facility], [place] [address] [place], [place] [address]  Culture, blood (Routine X 2) w Reflex to ID Panel     Status: None (Preliminary result)   Collection Time: 07/09/18  6:19 PM  Result Value Ref Range   Specimen Description      BLOOD PORTA CATH Performed at [place] [address], [place], [place] [address]    Special Requests      BOTTLES DRAWN AEROBIC AND ANAEROBIC Blood Culture adequate volume Performed at [place] [address], [place], [place] [address]    Culture  Setup Time      GRAM NEGATIVE RODS IN BOTH AEROBIC AND ANAEROBIC BOTTLES CRITICAL VALUE NOTED.  VALUE IS CONSISTENT WITH PREVIOUSLY REPORTED AND CALLED VALUE.    Culture      NO GROWTH < 24 HOURS Performed at [facility], [place]  [address] [place] [address], [place], [place] [address]    Report Status PENDING   Glucose, capillary     Status: Abnormal   Collection Time: 07/09/18  9:19 PM  Result Value Ref Range   Glucose-Capillary 166 (H) 70 - 99 mg/dL  Comprehensive metabolic panel     Status: Abnormal   Collection Time: 07/10/18  6:00 AM  Result Value Ref Range   Sodium 126 (L) 135 - 145 mmol/L   Potassium 4.5 3.5 - 5.1 mmol/L   Chloride 94 (L) 98 - 111 mmol/L   CO2 25 22 - 32 mmol/L   Glucose, Bld 139 (H) 70 - 99 mg/dL   BUN 31 (H) 6 - 20 mg/dL   Creatinine, Ser 0.71 0.44 - 1.00 mg/dL   Calcium 8.1 (L) 8.9 - 10.3 mg/dL   Total Protein 5.8 (L) 6.5 - 8.1 g/dL   Albumin 1.8 (L) 3.5 - 5.0 g/dL   AST 29 15 - 41 U/L   ALT  56 (H) 0 - 44 U/L   Alkaline Phosphatase 212 (H) 38 - 126 U/L   Total Bilirubin 7.2 (H) 0.3 - 1.2 mg/dL   GFR calc non Af Amer >60 >60 mL/min   GFR calc Af Amer >60 >60 mL/min    Comment: (NOTE) The eGFR has been calculated using the CKD EPI equation. This calculation has not been validated in all clinical situations. eGFR's persistently <60 mL/min signify possible Chronic Kidney Disease.    Anion gap 7 5 - 15    Comment: Performed at [facility], [place] [address], [place], [place] [address]  CBC     Status: Abnormal   Collection Time: 07/10/18  6:00 AM  Result Value Ref Range   WBC 20.8 (H) 4.0 - 10.5 K/uL   RBC 3.07 (L) 3.87 - 5.11 MIL/uL   Hemoglobin 8.0 (L) 12.0 - 15.0 g/dL   HCT 25.6 (L) 36.0 - 46.0 %   MCV 83.4 80.0 - 100.0 fL   MCH 26.1 26.0 - 34.0 pg   MCHC 31.3 30.0 - 36.0 g/dL   RDW 26.3 (H) 11.5 - 15.5 %   Platelets 176 150 - 400 K/uL   nRBC 0.0 0.0 - 0.2 %    Comment: Performed at [facility], [place] [address] [place], [place] [address]  Protime-INR     Status: Abnormal   Collection Time: 07/10/18  6:00 AM  Result Value Ref Range   Prothrombin Time 19.1 (H) 11.4 - 15.2 seconds   INR 1.62     Comment: Performed at [facility], [place] [address], [place], [place] [address]  APTT     Status: None   Collection Time: 07/10/18  6:00 AM  Result Value Ref Range   aPTT 28 24 - 36 seconds    Comment: Performed at [facility], [place] [address] [place], [place] [address]  Glucose, capillary     Status: Abnormal   Collection Time: 07/10/18  9:35 AM  Result Value Ref Range   Glucose-Capillary 133 (H) 70 - 99 mg/dL  Glucose, capillary     Status: Abnormal   Collection Time: 07/10/18 11:43 AM  Result Value Ref Range   Glucose-Capillary 131 (H) 70 - 99 mg/dL   Comment 1 Notify RN    Comment 2 Document in Chart       Component Value Date/Time   SDES  07/09/2018 1819    BLOOD PORTA CATH Performed at  [facility], [place]  [address] [place], [place] [address]    SPECREQUEST  07/09/2018 1819    BOTTLES DRAWN AEROBIC AND ANAEROBIC Blood Culture adequate volume Performed at [facility], [place] [address] [place], [place] [address]    CULT  07/09/2018 1819    NO GROWTH < 24 HOURS Performed at [place] [address], [place], [place] [address]    REPTSTATUS PENDING 07/09/2018 1819   No results found. Recent Results (from the past 240 hour(s))  Culture, Blood     Status: None (Preliminary result)   Collection Time: 07/08/18 11:12 AM  Result Value Ref Range Status   Specimen Description BLOOD PORTA CATH RIGHT  Final   Special Requests   Final    BOTTLES DRAWN AEROBIC AND ANAEROBIC Blood Culture adequate volume   Culture  Setup Time   Final    GRAM NEGATIVE RODS IN BOTH AEROBIC AND ANAEROBIC BOTTLES CRITICAL RESULT CALLED TO, READ BACK BY AND VERIFIED WITH: S CARDWELL RN [phone] BY GF Performed at [facility], [place] [address] [place], [place] [address]    Culture GRAM NEGATIVE RODS  Final   Report Status PENDING  Incomplete  Blood Culture ID Panel (Reflexed)     Status: Abnormal   Collection Time: 07/08/18 11:12 AM  Result Value Ref Range Status   Enterococcus species NOT DETECTED NOT DETECTED Final   Listeria monocytogenes NOT DETECTED NOT DETECTED Final   Staphylococcus species NOT DETECTED NOT DETECTED Final   Staphylococcus aureus (BCID) NOT DETECTED NOT DETECTED Final   Streptococcus species NOT DETECTED NOT DETECTED Final   Streptococcus agalactiae NOT DETECTED NOT DETECTED Final   Streptococcus pneumoniae NOT DETECTED NOT DETECTED Final   Streptococcus pyogenes NOT DETECTED NOT DETECTED Final   Acinetobacter baumannii NOT DETECTED NOT DETECTED Final   Enterobacteriaceae species NOT DETECTED NOT DETECTED Final   Enterobacter cloacae complex NOT DETECTED NOT DETECTED Final   Escherichia coli NOT DETECTED NOT DETECTED  Final   Klebsiella oxytoca NOT DETECTED NOT DETECTED Final   Klebsiella pneumoniae NOT DETECTED NOT DETECTED Final   Proteus species NOT DETECTED NOT DETECTED Final   Serratia marcescens NOT DETECTED NOT DETECTED Final   Carbapenem resistance NOT DETECTED NOT DETECTED Final   Haemophilus influenzae NOT DETECTED NOT DETECTED Final   Neisseria meningitidis NOT DETECTED NOT DETECTED Final   Pseudomonas aeruginosa DETECTED (A) NOT DETECTED Final    Comment: CRITICAL RESULT CALLED TO, READ BACK BY AND VERIFIED WITH: S CAEDWELL RN [phone] [id number] BY GF    Candida albicans NOT DETECTED NOT DETECTED Final   Candida glabrata NOT DETECTED NOT DETECTED Final   Candida krusei NOT DETECTED NOT DETECTED Final   Candida parapsilosis NOT DETECTED NOT DETECTED Final   Candida tropicalis NOT DETECTED NOT DETECTED Final    Comment: Performed at [facility], [address] [place], [place] [address]  Culture, blood (Routine X 2) w Reflex to ID Panel     Status: None (Preliminary result)   Collection Time: 07/09/18  5:18 PM  Result Value Ref Range Status   Specimen Description   Final    BLOOD BLOOD LEFT ARM Performed at [place] [address], [place], [place] [address]    Special Requests   Final    BOTTLES DRAWN AEROBIC AND ANAEROBIC Blood Culture adequate volume Performed at [place] [address] [place], [place] [address]    Culture   Final    NO GROWTH <  24 HOURS Performed at [facility], [place] [address], [place], [place] [address]    Report Status PENDING  Incomplete  Culture, blood (Routine X 2) w Reflex to ID Panel     Status: None (Preliminary result)   Collection Time: 07/09/18  6:19 PM  Result Value Ref Range Status   Specimen Description   Final    BLOOD PORTA CATH Performed at [place] [address], [place], [place] [address]    Special Requests   Final    BOTTLES DRAWN AEROBIC AND ANAEROBIC  Blood Culture adequate volume Performed at [place] [address] [place], [place] [address]    Culture  Setup Time   Final    GRAM NEGATIVE RODS IN BOTH AEROBIC AND ANAEROBIC BOTTLES CRITICAL VALUE NOTED.  VALUE IS CONSISTENT WITH PREVIOUSLY REPORTED AND CALLED VALUE.    Culture   Final    NO GROWTH < 24 HOURS Performed at [facility], [place] [address] [place], [place] [address]    Report Status PENDING  Incomplete    Microbiology: Recent Results (from the past 240 hour(s))  Culture, Blood     Status: None (Preliminary result)   Collection Time: 07/08/18 11:12 AM  Result Value Ref Range Status   Specimen Description BLOOD PORTA CATH RIGHT  Final   Special Requests   Final    BOTTLES DRAWN AEROBIC AND ANAEROBIC Blood Culture adequate volume   Culture  Setup Time   Final    GRAM NEGATIVE RODS IN BOTH AEROBIC AND ANAEROBIC BOTTLES CRITICAL RESULT CALLED TO, READ BACK BY AND VERIFIED WITH: S CARDWELL RN [phone] BY GF Performed at [facility], [place] [address] [place], [place] [address]    Culture GRAM NEGATIVE RODS  Final   Report Status PENDING  Incomplete  Blood Culture ID Panel (Reflexed)     Status: Abnormal   Collection Time: 07/08/18 11:12 AM  Result Value Ref Range Status   Enterococcus species NOT DETECTED NOT DETECTED Final   Listeria monocytogenes NOT DETECTED NOT DETECTED Final   Staphylococcus species NOT DETECTED NOT DETECTED Final   Staphylococcus aureus (BCID) NOT DETECTED NOT DETECTED Final   Streptococcus species NOT DETECTED NOT DETECTED Final   Streptococcus agalactiae NOT DETECTED NOT DETECTED Final   Streptococcus pneumoniae NOT DETECTED NOT DETECTED Final   Streptococcus pyogenes NOT DETECTED NOT DETECTED Final   Acinetobacter baumannii NOT DETECTED NOT DETECTED Final   Enterobacteriaceae species NOT DETECTED NOT DETECTED Final   Enterobacter cloacae complex NOT DETECTED NOT DETECTED Final   Escherichia coli NOT  DETECTED NOT DETECTED Final   Klebsiella oxytoca NOT DETECTED NOT DETECTED Final   Klebsiella pneumoniae NOT DETECTED NOT DETECTED Final   Proteus species NOT DETECTED NOT DETECTED Final   Serratia marcescens NOT DETECTED NOT DETECTED Final   Carbapenem resistance NOT DETECTED NOT DETECTED Final   Haemophilus influenzae NOT DETECTED NOT DETECTED Final   Neisseria meningitidis NOT DETECTED NOT DETECTED Final   Pseudomonas aeruginosa DETECTED (A) NOT DETECTED Final    Comment: CRITICAL RESULT CALLED TO, READ BACK BY AND VERIFIED WITH: S CAEDWELL RN [phone] BY GF    Candida albicans NOT DETECTED NOT DETECTED Final   Candida glabrata NOT DETECTED NOT DETECTED Final   Candida krusei NOT DETECTED NOT DETECTED Final   Candida parapsilosis NOT DETECTED NOT DETECTED Final   Candida tropicalis NOT DETECTED NOT DETECTED Final    Comment: Performed at [facility]  [facility], [place] [address], [place], [place] [address]  Culture, blood (Routine X 2) w Reflex to ID Panel     Status: None (Preliminary result)   Collection Time: 07/09/18  5:18 PM  Result Value Ref Range Status   Specimen Description   Final    BLOOD BLOOD LEFT ARM Performed at [place] [address] [place], [place] [address]    Special Requests   Final    BOTTLES DRAWN AEROBIC AND ANAEROBIC Blood Culture adequate volume Performed at [place] [address] [place], [place] [address]    Culture   Final    NO GROWTH < 24 HOURS Performed at [place] [address], [place], [place] [address]    Report Status PENDING  Incomplete  Culture, blood (Routine X 2) w Reflex to ID Panel     Status: None (Preliminary result)   Collection Time: 07/09/18  6:19 PM  Result Value Ref Range Status   Specimen Description   Final    BLOOD PORTA CATH Performed at [place] [address] [place], [place] [address]    Special Requests   Final    BOTTLES DRAWN  AEROBIC AND ANAEROBIC Blood Culture adequate volume Performed at [place] [address], [place], [place] [address]    Culture  Setup Time   Final    GRAM NEGATIVE RODS IN BOTH AEROBIC AND ANAEROBIC BOTTLES CRITICAL VALUE NOTED.  VALUE IS CONSISTENT WITH PREVIOUSLY REPORTED AND CALLED VALUE.    Culture   Final    NO GROWTH < 24 HOURS Performed at [facility], [place] [address] [place], [place] [address]    Report Status PENDING  Incomplete    Radiographs and labs were personally reviewed by me.   Bobby Rumpf, MD [facility] [phone] 07/10/2018, 4:29 PM

## 2018-07-10 NOTE — Progress Notes (Signed)
PROGRESS NOTE    Crystal Carlson  LEX:[id number] DOB: 1966/06/26 DOA: 07/09/2018 PCP: Mack Hook, MD   Brief Narrative: Patient is a 52 year old female with past medical history of stage IV metastatic pancreatic cancer on chemotherapy, chronic anemia, who  was sent to the emergency department after her blood cultures done as an outpatient was positive for Pseudomonas.  She was also admitted here a few weeks ago for solid fever and chills and worsening of her metastatic disease and was found to have possible liver abscess and then was eventually discharged on oral antibiotics. She has been admitted at this time for possible Pseudomonas bacteremia.  Oncology and ID following  Assessment & Plan:   Principal Problem:   Pseudomonal bacteremia Active Problems:   Chronic pancreatitis (HCC)   Microcytic anemia   HTN (hypertension)   Elevated LFTs   Pancreatic cancer metastasized to liver (HCC)   Cancer related pain   Protein-calorie malnutrition, moderate (HCC)   Thrombocytopenia (HCC)   Cancer of head of pancreas (HCC)   Sepsis ([place])   Pseudomonas bacteremia: Outpatient blood cultures showing Pseudomonas.  Started on cefepime.  She was reported to be febrile at home.  New blood cultures have been sent.  Case was discussed with Dr. Johnnye Sima and Dr. Learta Codding during the time of admission. Currently she is hemodynamically stable.  Afebrile. As per the infectious disease, no need to take the port out at this time and they will follow.  Leukocytosis: Improving.  Might be associated with bacteremia or could be just reactive.  She is currently afebrile.  Stage 4  pancreas cancer: Dr. Learta Codding.  Was on chemotherapy.  Recently had biliary stent exchange by GI for metastatic pancreatic cancer on 07/05/18.  Patient has mild elevation of liver enzymes but they are improving.  Severe protein calorie malnourishment: Nutrition consulted  Anemia chronic disease: Associated  with malignancy.  Continue  to monitor the CBC.  Drop in hemoglobin today most likely secondary to hemodilution.  Acute kidney injury: Continue gentle IV hydration.  Kidney function improving.  Hyponatremia: Continue IV fluids.  Improving.    DVT prophylaxis: heparin [place] Code Status: Full Family Communication: Family member present at the bedside Disposition Plan: Home after conclusive reports of the blood culture  Consultants: ID, oncology  Procedures: None  Antimicrobials: Cefepime  Subjective: Patient seen and examined the bedside this morning.  Remains comfortable.  Complains of low appetite, weakness.  She was found to be afebrile.  Denies any chest pain, nausea or vomiting.  Objective: Vitals:   07/10/18 0434 07/10/18 0500 07/10/18 0600 07/10/18 1409  BP: 108/70   99/77  Pulse: (!) 102   100  Resp: 12  18 18   Temp: 98.9 F (37.2 C)   98 F (36.7 C)  TempSrc: Oral   Oral  SpO2: 99%   100%  Weight: 49.3 kg 48.5 kg    Height:        Intake/Output Summary (Last 24 hours) at 07/10/2018 1441 Last data filed at 07/10/2018 0600 Gross per 24 hour  Intake 1138.43 ml  Output 900 ml  Net 238.43 ml   Filed Weights   07/10/18 0434 07/10/18 0500  Weight: 49.3 kg 48.5 kg    Examination:  General exam: Looks chronically ill, generalized weakness HEENT:PERRL,Oral mucosa moist, Ear/Nose normal on gross exam Respiratory system: Bilateral equal air entry, normal vesicular breath sounds, no wheezes or crackles  Cardiovascular system: S1 & S2 heard, RRR. No JVD, murmurs, rubs, gallops or clicks. No pedal edema.  Chemo-Port on the right chest Gastrointestinal system: Abdomen is nondistended, soft and nontender. No organomegaly or masses felt. Normal bowel sounds heard. Central nervous system: Alert and oriented. No focal neurological deficits. Extremities: No edema, no clubbing ,no cyanosis, distal peripheral pulses palpable. Skin: No rashes, lesions or ulcers,no icterus ,no pallor MSK: Normal muscle  bulk,tone ,power Psychiatry: Judgement and insight appear normal. Mood & affect appropriate.     Data Reviewed: I have personally reviewed following labs and imaging studies  CBC: Recent Labs  Lab 07/08/18 1112 07/09/18 1717 07/10/18 0600  WBC 25.9* 25.6* 20.8*  NEUTROABS 23.8* 23.3*  --   HGB 9.9* 9.5* 8.0*  HCT 30.5* 30.8* 25.6*  MCV 80.1 82.4 83.4  PLT 182 228 956   Basic Metabolic Panel: Recent Labs  Lab 07/08/18 1112 07/09/18 1717 07/10/18 0600  NA 126* 123* 126*  K 5.3* 5.0 4.5  CL 89* 88* 94*  CO2 27 25 25   GLUCOSE 234* 204* 139*  BUN 39* 37* 31*  CREATININE 1.25* 0.87 0.71  CALCIUM 9.3 8.4* 8.1*  MG  --  2.4  --    GFR: Estimated Creatinine Clearance: 64.4 mL/min (by C-G formula based on SCr of 0.71 mg/dL). Liver Function Tests: Recent Labs  Lab 07/08/18 1112 07/09/18 1717 07/10/18 0600  AST 52* 38 29  ALT 103* 73* 56*  ALKPHOS 332* 258* 212*  BILITOT 9.0* 9.0* 7.2*  PROT 7.3 6.9 5.8*  ALBUMIN 2.1* 2.0* 1.8*   No results for input(s): LIPASE, AMYLASE in the last 168 hours. Recent Labs  Lab 07/09/18 1722  AMMONIA 55*   Coagulation Profile: Recent Labs  Lab 07/10/18 0600  INR 1.62   Cardiac Enzymes: No results for input(s): CKTOTAL, CKMB, CKMBINDEX, TROPONINI in the last 168 hours. BNP (last 3 results) No results for input(s): PROBNP in the last 8760 hours. HbA1C: No results for input(s): HGBA1C in the last 72 hours. CBG: Recent Labs  Lab 07/09/18 1644 07/09/18 2119 07/10/18 0935 07/10/18 1143  GLUCAP 237* 166* 133* 131*   Lipid Profile: No results for input(s): CHOL, HDL, LDLCALC, TRIG, CHOLHDL, LDLDIRECT in the last 72 hours. Thyroid Function Tests: No results for input(s): TSH, T4TOTAL, FREET4, T3FREE, THYROIDAB in the last 72 hours. Anemia Panel: No results for input(s): VITAMINB12, FOLATE, FERRITIN, TIBC, IRON, RETICCTPCT in the last 72 hours. Sepsis Labs: No results for input(s): PROCALCITON, LATICACIDVEN in the last 168  hours.  Recent Results (from the past 240 hour(s))  Culture, Blood     Status: None (Preliminary result)   Collection Time: 07/08/18 11:12 AM  Result Value Ref Range Status   Specimen Description BLOOD PORTA CATH RIGHT  Final   Special Requests   Final    BOTTLES DRAWN AEROBIC AND ANAEROBIC Blood Culture adequate volume   Culture  Setup Time   Final    GRAM NEGATIVE RODS IN BOTH AEROBIC AND ANAEROBIC BOTTLES CRITICAL RESULT CALLED TO, READ BACK BY AND VERIFIED WITH: S CARDWELL RN [phone] BY GF Performed at [facility], [place] [address] [place], [place] [address]    Culture GRAM NEGATIVE RODS  Final   Report Status PENDING  Incomplete  Blood Culture ID Panel (Reflexed)     Status: Abnormal   Collection Time: 07/08/18 11:12 AM  Result Value Ref Range Status   Enterococcus species NOT DETECTED NOT DETECTED Final   Listeria monocytogenes NOT DETECTED NOT DETECTED Final   Staphylococcus species NOT DETECTED NOT DETECTED Final   Staphylococcus aureus (BCID) NOT  DETECTED NOT DETECTED Final   Streptococcus species NOT DETECTED NOT DETECTED Final   Streptococcus agalactiae NOT DETECTED NOT DETECTED Final   Streptococcus pneumoniae NOT DETECTED NOT DETECTED Final   Streptococcus pyogenes NOT DETECTED NOT DETECTED Final   Acinetobacter baumannii NOT DETECTED NOT DETECTED Final   Enterobacteriaceae species NOT DETECTED NOT DETECTED Final   Enterobacter cloacae complex NOT DETECTED NOT DETECTED Final   Escherichia coli NOT DETECTED NOT DETECTED Final   Klebsiella oxytoca NOT DETECTED NOT DETECTED Final   Klebsiella pneumoniae NOT DETECTED NOT DETECTED Final   Proteus species NOT DETECTED NOT DETECTED Final   Serratia marcescens NOT DETECTED NOT DETECTED Final   Carbapenem resistance NOT DETECTED NOT DETECTED Final   Haemophilus influenzae NOT DETECTED NOT DETECTED Final   Neisseria meningitidis NOT DETECTED NOT DETECTED Final   Pseudomonas aeruginosa DETECTED (A) NOT DETECTED  Final    Comment: CRITICAL RESULT CALLED TO, READ BACK BY AND VERIFIED WITH: S CAEDWELL RN [phone] BY GF    Candida albicans NOT DETECTED NOT DETECTED Final   Candida glabrata NOT DETECTED NOT DETECTED Final   Candida krusei NOT DETECTED NOT DETECTED Final   Candida parapsilosis NOT DETECTED NOT DETECTED Final   Candida tropicalis NOT DETECTED NOT DETECTED Final    Comment: Performed at [facility], [place]. [address] [place], [place] [address]  Culture, blood (Routine X 2) w Reflex to ID Panel     Status: None (Preliminary result)   Collection Time: 07/09/18  5:18 PM  Result Value Ref Range Status   Specimen Description   Final    BLOOD BLOOD LEFT ARM Performed at [facility] [address] [place], [place] [address]    Special Requests   Final    BOTTLES DRAWN AEROBIC AND ANAEROBIC Blood Culture adequate volume Performed at [facility] [address], [place], [place] [address]    Culture   Final    NO GROWTH < 24 HOURS Performed at [facility] [address], [place], [place] [address]    Report Status PENDING  Incomplete  Culture, blood (Routine X 2) w Reflex to ID Panel     Status: None (Preliminary result)   Collection Time: 07/09/18  6:19 PM  Result Value Ref Range Status   Specimen Description   Final    BLOOD PORTA CATH Performed at [facility] [address], [place], [place] [address]    Special Requests   Final    BOTTLES DRAWN AEROBIC AND ANAEROBIC Blood Culture adequate volume Performed at [facility] [address], [place], [place] [address]    Culture  Setup Time   Final    GRAM NEGATIVE RODS IN BOTH AEROBIC AND ANAEROBIC BOTTLES CRITICAL VALUE NOTED.  VALUE IS CONSISTENT WITH PREVIOUSLY REPORTED AND CALLED VALUE.    Culture   Final    NO GROWTH < 24 HOURS Performed at [facility], [place] [address], [place], [place] [address]    Report Status PENDING   Incomplete         Radiology Studies: No results found.      Scheduled Meds: . ferrous sulfate  325 mg Oral BID WC  . insulin aspart  0-5 Units Subcutaneous QHS  . insulin aspart  0-9 Units Subcutaneous TID WC  . lactose free nutrition  237 mL Oral TID WC  . morphine  15 mg Oral Q12H  . predniSONE  5 mg  Oral Q breakfast   Continuous Infusions: . sodium chloride 75 mL/hr at 07/10/18 0630  . ceFEPime (MAXIPIME) IV       LOS: 1 day    Time spent: 35 mins.More than 50% of that time was spent in counseling and/or coordination of care.      Shelly Coss, MD [facility] Pager [phone]  If 7PM-7AM, please contact night-coverage [email or web address] Password [facility] 07/10/2018, 2:41 PM

## 2018-07-11 DIAGNOSIS — G893 Neoplasm related pain (acute) (chronic): Secondary | ICD-10-CM

## 2018-07-11 DIAGNOSIS — Z95828 Presence of other vascular implants and grafts: Secondary | ICD-10-CM

## 2018-07-11 DIAGNOSIS — B965 Pseudomonas (aeruginosa) (mallei) (pseudomallei) as the cause of diseases classified elsewhere: Secondary | ICD-10-CM

## 2018-07-11 LAB — GLUCOSE, CAPILLARY
GLUCOSE-CAPILLARY: 146 mg/dL — AB (ref 70–99)
GLUCOSE-CAPILLARY: 229 mg/dL — AB (ref 70–99)
Glucose-Capillary: 134 mg/dL — ABNORMAL HIGH (ref 70–99)
Glucose-Capillary: 128 mg/dL — ABNORMAL HIGH (ref 70–99)

## 2018-07-11 LAB — CBC WITH DIFFERENTIAL/PLATELET
Abs Immature Granulocytes: 0.59 10*3/uL — ABNORMAL HIGH (ref 0.00–0.07)
BASOS ABS: 0 10*3/uL (ref 0.0–0.1)
BASOS PCT: 0 %
EOS ABS: 0.2 10*3/uL (ref 0.0–0.5)
EOS PCT: 1 %
HEMATOCRIT: 28.4 % — AB (ref 36.0–46.0)
Hemoglobin: 8.6 g/dL — ABNORMAL LOW (ref 12.0–15.0)
Immature Granulocytes: 3 %
Lymphocytes Relative: 5 %
Lymphs Abs: 1 10*3/uL (ref 0.7–4.0)
MCH: 25.7 pg — AB (ref 26.0–34.0)
MCHC: 30.3 g/dL (ref 30.0–36.0)
MCV: 85 fL (ref 80.0–100.0)
MONO ABS: 1.5 10*3/uL — AB (ref 0.1–1.0)
Monocytes Relative: 8 %
NRBC: 0 % (ref 0.0–0.2)
Neutro Abs: 14.9 10*3/uL — ABNORMAL HIGH (ref 1.7–7.7)
Neutrophils Relative %: 83 %
Platelets: 201 10*3/uL (ref 150–400)
RBC: 3.34 MIL/uL — AB (ref 3.87–5.11)
RDW: 26.2 % — AB (ref 11.5–15.5)
WBC: 18.1 10*3/uL — AB (ref 4.0–10.5)

## 2018-07-11 LAB — COMPREHENSIVE METABOLIC PANEL
ALT: 50 U/L — AB (ref 0–44)
AST: 39 U/L (ref 15–41)
Albumin: 1.7 g/dL — ABNORMAL LOW (ref 3.5–5.0)
Alkaline Phosphatase: 237 U/L — ABNORMAL HIGH (ref 38–126)
Anion gap: 8 (ref 5–15)
BILIRUBIN TOTAL: 7.5 mg/dL — AB (ref 0.3–1.2)
BUN: 23 mg/dL — AB (ref 6–20)
CHLORIDE: 102 mmol/L (ref 98–111)
CO2: 21 mmol/L — ABNORMAL LOW (ref 22–32)
CREATININE: 0.65 mg/dL (ref 0.44–1.00)
Calcium: 8.3 mg/dL — ABNORMAL LOW (ref 8.9–10.3)
GFR calc Af Amer: >60 mL/min (ref >60)
Glucose, Bld: 90 mg/dL (ref 70–99)
Potassium: 4.2 mmol/L (ref 3.5–5.1)
Sodium: 131 mmol/L — ABNORMAL LOW (ref 135–145)
TOTAL PROTEIN: 5.8 g/dL — AB (ref 6.5–8.1)

## 2018-07-11 LAB — CULTURE, BLOOD (SINGLE): Special Requests: ADEQUATE

## 2018-07-11 MED ORDER — SIMETHICONE 80 MG PO CHEW
80.0000 mg | CHEWABLE_TABLET | Freq: Four times a day (QID) | ORAL | Status: AC
  Administered 2018-07-11 – 2018-07-12 (×4): 80 mg via ORAL
  Filled 2018-07-11 (×4): qty 1

## 2018-07-11 MED ORDER — GLUCERNA SHAKE PO LIQD
237.0000 mL | Freq: Three times a day (TID) | ORAL | Status: DC
  Administered 2018-07-11 – 2018-07-15 (×6): 237 mL via ORAL
  Filled 2018-07-11 (×16): qty 237

## 2018-07-11 MED ORDER — POLYETHYLENE GLYCOL 3350 17 G PO PACK: 17.0000 g | PACK | Freq: Every day | ORAL | Status: DC | PRN

## 2018-07-11 NOTE — Progress Notes (Signed)
INFECTIOUS DISEASE PROGRESS NOTE  ID: Crystal Carlson is a 52 y.o. female with  Principal Problem:   Pseudomonal bacteremia Active Problems:   Chronic pancreatitis (HCC)   Microcytic anemia   HTN (hypertension)   Elevated LFTs   Pancreatic cancer metastasized to liver [facility])   Cancer related pain   Protein-calorie malnutrition, moderate (HCC)   Thrombocytopenia (HCC)   Cancer of head of pancreas (HCC)   Sepsis ([place])  Subjective: C/o abd pain  Abtx:  Anti-infectives (From admission, onward)   Start     Dose/Rate Route Frequency Ordered Stop   07/10/18 1400  ceFEPIme (MAXIPIME) 2 g in sodium chloride 0.9 % 100 mL IVPB     2 g 200 mL/hr over 30 Minutes Intravenous Every 8 hours 07/10/18 1344     07/09/18 1730  ceFEPIme (MAXIPIME) 2 g in sodium chloride 0.9 % 100 mL IVPB  Status:  Discontinued     2 g 200 mL/hr over 30 Minutes Intravenous Every 12 hours 07/09/18 1649 07/10/18 1344      Medications:  Scheduled: . ferrous sulfate  325 mg Oral BID WC  . insulin aspart  0-5 Units Subcutaneous QHS  . insulin aspart  0-9 Units Subcutaneous TID WC  . lactose free nutrition  237 mL Oral TID WC  . morphine  15 mg Oral Q12H  . predniSONE  5 mg Oral Q breakfast    Objective: Vital signs in last 24 hours: Temp:  [98 F (36.7 C)-99.8 F (37.7 C)] 99.8 F (37.7 C) (11/24 0626) Pulse Rate:  [94-100] 99 (11/24 0626) Resp:  [18-20] 20 (11/24 0626) BP: (99-107)/(77-83) 107/77 (11/24 0626) SpO2:  [96 %-100 %] 96 % (11/24 1116)   General appearance: alert, cooperative, fatigued, icteric and no distress Resp: clear to auscultation bilaterally Chest wall: no tenderness, no fluctuance at port.  Cardio: regular rate and rhythm GI: normal findings: bowel sounds normal and abnormal findings:  distended and mild tenderness, no r/g Extremities: edema none  Lab Results Recent Labs    07/10/18 0600 07/11/18 0501  WBC 20.8* 18.1*  HGB 8.0* 8.6*  HCT 25.6* 28.4*  NA 126* 131*  K 4.5  4.2  CL 94* 102  CO2 25 21*  BUN 31* 23*  CREATININE 0.71 0.65   Liver Panel Recent Labs    07/10/18 0600 07/11/18 0501  PROT 5.8* 5.8*  ALBUMIN 1.8* 1.7*  AST 29 39  ALT 56* 50*  ALKPHOS 212* 237*  BILITOT 7.2* 7.5*   Sedimentation Rate No results for input(s): ESRSEDRATE in the last 72 hours. C-Reactive Protein No results for input(s): CRP in the last 72 hours.  Microbiology: Recent Results (from the past 240 hour(s))  Culture, Blood     Status: Abnormal   Collection Time: 07/08/18 11:12 AM  Result Value Ref Range Status   Specimen Description BLOOD PORTA CATH RIGHT  Final   Special Requests   Final    BOTTLES DRAWN AEROBIC AND ANAEROBIC Blood Culture adequate volume   Culture  Setup Time   Final    GRAM NEGATIVE RODS IN BOTH AEROBIC AND ANAEROBIC BOTTLES CRITICAL RESULT CALLED TO, READ BACK BY AND VERIFIED WITH: S CARDWELL RN [phone] BY GF Performed at [facility], [place] [address], [place], [place] [address]    Culture PSEUDOMONAS AERUGINOSA (A)  Final   Report Status 07/11/2018 FINAL  Final   Organism ID, Bacteria PSEUDOMONAS AERUGINOSA  Final      Susceptibility   Pseudomonas aeruginosa -  MIC*    CEFTAZIDIME <=1 SENSITIVE Sensitive     CIPROFLOXACIN >=4 RESISTANT Resistant     GENTAMICIN <=1 SENSITIVE Sensitive     IMIPENEM 1 SENSITIVE Sensitive     PIP/TAZO <=4 SENSITIVE Sensitive     CEFEPIME <=1 SENSITIVE Sensitive     * PSEUDOMONAS AERUGINOSA  Blood Culture ID Panel (Reflexed)     Status: Abnormal   Collection Time: 07/08/18 11:12 AM  Result Value Ref Range Status   Enterococcus species NOT DETECTED NOT DETECTED Final   Listeria monocytogenes NOT DETECTED NOT DETECTED Final   Staphylococcus species NOT DETECTED NOT DETECTED Final   Staphylococcus aureus (BCID) NOT DETECTED NOT DETECTED Final   Streptococcus species NOT DETECTED NOT DETECTED Final   Streptococcus agalactiae NOT DETECTED NOT DETECTED Final   Streptococcus pneumoniae NOT  DETECTED NOT DETECTED Final   Streptococcus pyogenes NOT DETECTED NOT DETECTED Final   Acinetobacter baumannii NOT DETECTED NOT DETECTED Final   Enterobacteriaceae species NOT DETECTED NOT DETECTED Final   Enterobacter cloacae complex NOT DETECTED NOT DETECTED Final   Escherichia coli NOT DETECTED NOT DETECTED Final   Klebsiella oxytoca NOT DETECTED NOT DETECTED Final   Klebsiella pneumoniae NOT DETECTED NOT DETECTED Final   Proteus species NOT DETECTED NOT DETECTED Final   Serratia marcescens NOT DETECTED NOT DETECTED Final   Carbapenem resistance NOT DETECTED NOT DETECTED Final   Haemophilus influenzae NOT DETECTED NOT DETECTED Final   Neisseria meningitidis NOT DETECTED NOT DETECTED Final   Pseudomonas aeruginosa DETECTED (A) NOT DETECTED Final    Comment: CRITICAL RESULT CALLED TO, READ BACK BY AND VERIFIED WITH: S CAEDWELL RN [phone] BY GF    Candida albicans NOT DETECTED NOT DETECTED Final   Candida glabrata NOT DETECTED NOT DETECTED Final   Candida krusei NOT DETECTED NOT DETECTED Final   Candida parapsilosis NOT DETECTED NOT DETECTED Final   Candida tropicalis NOT DETECTED NOT DETECTED Final    Comment: Performed at [facility], [address] [place], [place] [address]  Culture, blood (Routine X 2) w Reflex to ID Panel     Status: None (Preliminary result)   Collection Time: 07/09/18  5:18 PM  Result Value Ref Range Status   Specimen Description   Final    BLOOD BLOOD LEFT ARM Performed at [facility] [address], [place], [place] [address]    Special Requests   Final    BOTTLES DRAWN AEROBIC AND ANAEROBIC Blood Culture adequate volume Performed at [facility] [address], [place], [place] [address]    Culture  Setup Time   Final    GRAM NEGATIVE RODS AEROBIC BOTTLE ONLY CRITICAL VALUE NOTED.  VALUE IS CONSISTENT WITH PREVIOUSLY REPORTED AND CALLED VALUE. Performed at [facility], [place] [address]  [place], [place] [address]    Culture GRAM NEGATIVE RODS  Final   Report Status PENDING  Incomplete  Culture, blood (Routine X 2) w Reflex to ID Panel     Status: None (Preliminary result)   Collection Time: 07/09/18  6:19 PM  Result Value Ref Range Status   Specimen Description   Final    BLOOD PORTA CATH Performed at [facility] [address], [place], [place] [address]    Special Requests   Final    BOTTLES DRAWN AEROBIC AND ANAEROBIC Blood Culture adequate volume Performed at [facility] [address], [place], [place] [address]    Culture  Setup  Time   Final    GRAM NEGATIVE RODS IN BOTH AEROBIC AND ANAEROBIC BOTTLES CRITICAL VALUE NOTED.  VALUE IS CONSISTENT WITH PREVIOUSLY REPORTED AND CALLED VALUE. Performed at [facility], [place] [address], [place], [place] [address]    Culture GRAM NEGATIVE RODS  Final   Report Status PENDING  Incomplete    Studies/Results: No results found.   Assessment/Plan: Pseudomonas bacteremia Pancreatic Cancer  Total days of antibiotics: 2 cefepime  Afebrile since adm WBC remains up Repeat BCx 11-24 sent Difficult decision on whether to pull her port- given her comorbidities- as opposed to try to salvage. Will defer palliative care to her primary           Bobby Rumpf MD, FACP Infectious Diseases (pager) [phone] [email or web address] 07/11/2018, 11:24 AM  LOS: 2 days

## 2018-07-11 NOTE — Progress Notes (Signed)
[facility]  PROGRESS NOTE  Crystal Carlson PJK:[id number] DOB: [birth year] DOA: 07/09/2018 PCP: Mack Hook, MD   Brief HPI:   52 year old female with a history of stage IV metastatic pancreatic cancer on chemotherapy, chronic anemia who was sent to the ED after blood cultures done as outpatient was positive for Pseudomonas.  She was admitted here 5 weeks ago with fever and chills for worsening metastatic disease and was found to have possible liver abscess and was discharged oral antibiotics.  ID and oncology has been following patient for Pseudomonas bacteremia.    Subjective   Patient seen and examined, continues to have abdominal pain, patient takes Dilaudid for chronic pain.   Assessment/Plan:     1. Pseudomonas bacteremia-outpatient blood cultures grew Pseudomonas.  Patient started on cefepime.  Repeat blood cultures from 07/11/2018 are negative.  Patient has Port-A-Cath in place, will discuss with Dr. Malachy Mood regarding removing port.  2. Leukocytosis-improving, second to bacteremia.  Today WBC is 18,000.  3. Stage IV pancreatic cancer-followed by oncology as outpatient.  She was on chemotherapy.  Recently had biliary stent exchange by GI for metastatic pancreatic cancer on 07/05/2018.  4. Severe protein calorie malnutrition-nutrition consulted  5. Hyponatremia-improved with IV fluids.  Today sodium 131.      CBG: Recent Labs  Lab 07/10/18 0935 07/10/18 1143 07/10/18 1654 07/10/18 2328 07/11/18 0849  GLUCAP 133* 131* 188* 154* 134*    CBC: Recent Labs  Lab 07/08/18 1112 07/09/18 1717 07/10/18 0600 07/11/18 0501  WBC 25.9* 25.6* 20.8* 18.1*  NEUTROABS 23.8* 23.3*  --  14.9*  HGB 9.9* 9.5* 8.0* 8.6*  HCT 30.5* 30.8* 25.6* 28.4*  MCV 80.1 82.4 83.4 85.0  PLT 182 228 176 809    Basic Metabolic Panel: Recent Labs  Lab 07/08/18 1112 07/09/18 1717 07/10/18 0600 07/11/18 0501  NA 126* 123* 126* 131*  K 5.3* 5.0 4.5 4.2  CL 89* 88* 94* 102  CO2  27 25 25  21*  GLUCOSE 234* 204* 139* 90  BUN 39* 37* 31* 23*  CREATININE 1.25* 0.87 0.71 0.65  CALCIUM 9.3 8.4* 8.1* 8.3*  MG  --  2.4  --   --      DVT prophylaxis: Heparin  Code Status: Full code  Family Communication: Discussed with family member at bedside  Disposition Plan: likely home when medically ready for discharge   Consultants:  ID  Oncology  Procedures: None   Antibiotics:   Anti-infectives (From admission, onward)   Start     Dose/Rate Route Frequency Ordered Stop   07/10/18 1400  ceFEPIme (MAXIPIME) 2 g in sodium chloride 0.9 % 100 mL IVPB     2 g 200 mL/hr over 30 Minutes Intravenous Every 8 hours 07/10/18 1344     07/09/18 1730  ceFEPIme (MAXIPIME) 2 g in sodium chloride 0.9 % 100 mL IVPB  Status:  Discontinued     2 g 200 mL/hr over 30 Minutes Intravenous Every 12 hours 07/09/18 1649 07/10/18 1344       Objective   Vitals:   07/10/18 2331 07/11/18 0626 07/11/18 1116 07/11/18 1338  BP: 105/83 107/77  113/85  Pulse: 94 99  (!) 105  Resp: 20 20  20   Temp: 99.7 F (37.6 C) 99.8 F (37.7 C)  98.2 F (36.8 C)  TempSrc: Oral Oral  Oral  SpO2: 99% 98% 96% 93%  Weight:      Height:        Intake/Output Summary (Last 24 hours) at 07/11/2018 1552  Last data filed at 07/11/2018 1300 Gross per 24 hour  Intake 1753.17 ml  Output -  Net 1753.17 ml   Filed Weights   07/10/18 0434 07/10/18 0500  Weight: 49.3 kg 48.5 kg     Physical Examination:    General: Appears cachectic  Cardiovascular: S1-S2, regular, no murmurs auscultated  Respiratory: Clear to auscultation bilaterally  Abdomen: Soft, nontender, no organomegaly, mild distention of the abdomen.  Extremities: No cyanosis/clubbing/edema of the lower extremities  Neurologic: Alert, oriented x3, no focal deficit noted     Data Reviewed: I have personally reviewed following labs and imaging studies   Recent Results (from the past 240 hour(s))  Culture, Blood     Status:  Abnormal   Collection Time: 07/08/18 11:12 AM  Result Value Ref Range Status   Specimen Description BLOOD PORTA CATH RIGHT  Final   Special Requests   Final    BOTTLES DRAWN AEROBIC AND ANAEROBIC Blood Culture adequate volume   Culture  Setup Time   Final    GRAM NEGATIVE RODS IN BOTH AEROBIC AND ANAEROBIC BOTTLES CRITICAL RESULT CALLED TO, READ BACK BY AND VERIFIED WITH: S CARDWELL RN [phone] BY GF Performed at [facility], [place] [address] [place], [place] [address]    Culture PSEUDOMONAS AERUGINOSA (A)  Final   Report Status 07/11/2018 FINAL  Final   Organism ID, Bacteria PSEUDOMONAS AERUGINOSA  Final      Susceptibility   Pseudomonas aeruginosa - MIC*    CEFTAZIDIME <=1 SENSITIVE Sensitive     CIPROFLOXACIN >=4 RESISTANT Resistant     GENTAMICIN <=1 SENSITIVE Sensitive     IMIPENEM 1 SENSITIVE Sensitive     PIP/TAZO <=4 SENSITIVE Sensitive     CEFEPIME <=1 SENSITIVE Sensitive     * PSEUDOMONAS AERUGINOSA  Blood Culture ID Panel (Reflexed)     Status: Abnormal   Collection Time: 07/08/18 11:12 AM  Result Value Ref Range Status   Enterococcus species NOT DETECTED NOT DETECTED Final   Listeria monocytogenes NOT DETECTED NOT DETECTED Final   Staphylococcus species NOT DETECTED NOT DETECTED Final   Staphylococcus aureus (BCID) NOT DETECTED NOT DETECTED Final   Streptococcus species NOT DETECTED NOT DETECTED Final   Streptococcus agalactiae NOT DETECTED NOT DETECTED Final   Streptococcus pneumoniae NOT DETECTED NOT DETECTED Final   Streptococcus pyogenes NOT DETECTED NOT DETECTED Final   Acinetobacter baumannii NOT DETECTED NOT DETECTED Final   Enterobacteriaceae species NOT DETECTED NOT DETECTED Final   Enterobacter cloacae complex NOT DETECTED NOT DETECTED Final   Escherichia coli NOT DETECTED NOT DETECTED Final   Klebsiella oxytoca NOT DETECTED NOT DETECTED Final   Klebsiella pneumoniae NOT DETECTED NOT DETECTED Final   Proteus species NOT DETECTED NOT DETECTED  Final   Serratia marcescens NOT DETECTED NOT DETECTED Final   Carbapenem resistance NOT DETECTED NOT DETECTED Final   Haemophilus influenzae NOT DETECTED NOT DETECTED Final   Neisseria meningitidis NOT DETECTED NOT DETECTED Final   Pseudomonas aeruginosa DETECTED (A) NOT DETECTED Final    Comment: CRITICAL RESULT CALLED TO, READ BACK BY AND VERIFIED WITH: S CAEDWELL RN [phone] BY GF    Candida albicans NOT DETECTED NOT DETECTED Final   Candida glabrata NOT DETECTED NOT DETECTED Final   Candida krusei NOT DETECTED NOT DETECTED Final   Candida parapsilosis NOT DETECTED NOT DETECTED Final   Candida tropicalis NOT DETECTED NOT DETECTED Final    Comment: Performed at [facility], [address] [place], [place]  [address]  Culture, blood (Routine X 2) w Reflex to ID Panel     Status: Abnormal (Preliminary result)   Collection Time: 07/09/18  5:18 PM  Result Value Ref Range Status   Specimen Description   Final    BLOOD BLOOD LEFT ARM Performed at [facility] [address] [place], [place] [address]    Special Requests   Final    BOTTLES DRAWN AEROBIC AND ANAEROBIC Blood Culture adequate volume Performed at [facility] [address], [place], [place] [address]    Culture  Setup Time   Final    GRAM NEGATIVE RODS AEROBIC BOTTLE ONLY CRITICAL VALUE NOTED.  VALUE IS CONSISTENT WITH PREVIOUSLY REPORTED AND CALLED VALUE. Performed at [facility], [place] [address], [place], [place] [address]    Culture PSEUDOMONAS AERUGINOSA (A)  Final   Report Status PENDING  Incomplete  Culture, blood (Routine X 2) w Reflex to ID Panel     Status: Abnormal (Preliminary result)   Collection Time: 07/09/18  6:19 PM  Result Value Ref Range Status   Specimen Description   Final    BLOOD PORTA CATH Performed at [facility] [address], [place], [place] [address]    Special Requests   Final    BOTTLES DRAWN AEROBIC AND  ANAEROBIC Blood Culture adequate volume Performed at [facility] [address] [place], [place] [address]    Culture  Setup Time   Final    GRAM NEGATIVE RODS IN BOTH AEROBIC AND ANAEROBIC BOTTLES CRITICAL VALUE NOTED.  VALUE IS CONSISTENT WITH PREVIOUSLY REPORTED AND CALLED VALUE. Performed at [facility], [place] [address], [place], [place] [address]    Culture PSEUDOMONAS AERUGINOSA (A)  Final   Report Status PENDING  Incomplete  Culture, blood (Routine X 2) w Reflex to ID Panel     Status: None (Preliminary result)   Collection Time: 07/11/18  8:00 AM  Result Value Ref Range Status   Specimen Description   Final    BLOOD RIGHT ANTECUBITAL Performed at [facility] [address], [place], [place] [address]    Special Requests   Final    BOTTLES DRAWN AEROBIC ONLY Blood Culture adequate volume Performed at [facility] [address], [place], [place] [address]    Culture   Final    NO GROWTH <12 HOURS Performed at [facility] [address] [place], [place] [address]    Report Status PENDING  Incomplete     Liver Function Tests: Recent Labs  Lab 07/08/18 1112 07/09/18 1717 07/10/18 0600 07/11/18 0501  AST 52* 38 29 39  ALT 103* 73* 56* 50*  ALKPHOS 332* 258* 212* 237*  BILITOT 9.0* 9.0* 7.2* 7.5*  PROT 7.3 6.9 5.8* 5.8*  ALBUMIN 2.1* 2.0* 1.8* 1.7*   No results for input(s): LIPASE, AMYLASE in the last 168 hours. Recent Labs  Lab 07/09/18 1722  AMMONIA 55*    Cardiac Enzymes: No results for input(s): CKTOTAL, CKMB, CKMBINDEX, TROPONINI in the last 168 hours. BNP (last 3 results) No results for input(s): BNP in the last 8760 hours.  ProBNP (last 3 results) No results for input(s): PROBNP in the last 8760 hours.    Studies: No results found.  Scheduled Meds: . ferrous sulfate  325 mg Oral BID WC  . insulin aspart  0-5 Units Subcutaneous QHS  . insulin aspart  0-9 Units  Subcutaneous TID  WC  . lactose free nutrition  237 mL Oral TID WC  . morphine  15 mg Oral Q12H  . predniSONE  5 mg Oral Q breakfast    Admission status: Inpatient: Based on patients clinical presentation and evaluation of above clinical data, I have made determination that patient meets Inpatient criteria at this time.  Time spent: 20 min  [facility] Pager [phone]. If 7PM-7AM, please contact night-coverage at [email or web address], Office  [phone]  password [email or web address]  07/11/2018, 3:52 PM  LOS: 2 days

## 2018-07-12 ENCOUNTER — Inpatient Hospital Stay (HOSPITAL_COMMUNITY): Payer: Medicaid Other

## 2018-07-12 LAB — CULTURE, BLOOD (ROUTINE X 2)
Special Requests: ADEQUATE
Special Requests: ADEQUATE

## 2018-07-12 LAB — GLUCOSE, CAPILLARY
GLUCOSE-CAPILLARY: 102 mg/dL — AB (ref 70–99)
GLUCOSE-CAPILLARY: 175 mg/dL — AB (ref 70–99)
Glucose-Capillary: 142 mg/dL — ABNORMAL HIGH (ref 70–99)
Glucose-Capillary: 133 mg/dL — ABNORMAL HIGH (ref 70–99)

## 2018-07-12 MED ORDER — GADOBUTROL 1 MMOL/ML IV SOLN
4.0000 mL | Freq: Once | INTRAVENOUS | Status: AC | PRN
  Administered 2018-07-12: 4 mL via INTRAVENOUS

## 2018-07-12 MED ORDER — ADULT MULTIVITAMIN W/MINERALS CH
1.0000 | ORAL_TABLET | Freq: Every day | ORAL | Status: DC
  Administered 2018-07-12 – 2018-07-16 (×5): 1 via ORAL
  Filled 2018-07-12 (×5): qty 1

## 2018-07-12 NOTE — Consult Note (Signed)
[facility] Consult  Referring Provider: Eleonore Chiquito, MD Primary Care Physician:  Mack Hook, MD Primary Gastroenterologist: Dr.Brahmbhatt  Reason for Consultation:  Pseudomonal bacteremia, CBD stent in place  HPI: Crystal Carlson is a 52 y.o. female history of pancreatic cancer diagnosed in 10/18 with metastases to the liver,malignant ascites, on chemotherapy was admitted on 07/09/18 for positive blood cultures as an outpatient for pseudomonal bacteremia and admitted due to be started on IV antibiotics. The patient complains of increasing lethargy at home along with intermittent low-grade fever.  She recently had an ERCP on 07/05/18 and an uncovered 10 mm 8 cm metal stent was placed within the previously migrated stent with removal of sludge and necrotic tissue. There has been mild improvement in patient's liver enzymes,  T bili was 9 and a 7.5 ALP was 332 and is 237 AST was 254 and is 39 AST was 277 and is 50    Past Medical History:  Diagnosis Date  . Anxiety   . Cancer of pancreas ([facility]) 05/26/2017   chemo  . Chronic pancreatitis ([facility]) 05/19/2017  . Depression   . Edema    LE  . Liver metastases ([facility]) 05/26/2017  . Microcytic anemia 05/19/2017  . Prediabetes 2016   when living in [place]    Past Surgical History:  Procedure Laterality Date  . BILIARY STENT PLACEMENT N/A 07/05/2018   Procedure: BILIARY STENT PLACEMENT;  Surgeon: Clarene Essex, MD;  Location: [facility];  Service: Endoscopy;  Laterality: N/A;  . ENDOSCOPIC RETROGRADE CHOLANGIOPANCREATOGRAPHY (ERCP) WITH PROPOFOL N/A 07/05/2018   Procedure: ENDOSCOPIC RETROGRADE CHOLANGIOPANCREATOGRAPHY (ERCP) WITH PROPOFOL;  Surgeon: Clarene Essex, MD;  Location: [facility];  Service: Endoscopy;  Laterality: N/A;  with Balloon Sweep for Stones  . ERCP N/A 06/09/2017   Procedure: ENDOSCOPIC RETROGRADE CHOLANGIOPANCREATOGRAPHY (ERCP);  Surgeon: Clarene Essex, MD;  Location: [facility];  Service: Endoscopy;   Laterality: N/A;  . IR FLUORO GUIDE PORT INSERTION RIGHT  06/04/2017  . IR PARACENTESIS  04/13/2018  . IR PARACENTESIS  04/30/2018  . IR PARACENTESIS  05/12/2018  . IR US GUIDE VASC ACCESS RIGHT  06/04/2017  . LUMBAR LAMINECTOMY/DECOMPRESSION MICRODISCECTOMY Left 05/17/2018   Procedure: Left Lumbar Five-Sacral One Microdiscectomy;  Surgeon: Earnie Larsson, MD;  Location: [facility];  Service: Neurosurgery;  Laterality: Left;  . SPHINCTEROTOMY  07/05/2018   Procedure: SPHINCTEROTOMY;  Surgeon: Clarene Essex, MD;  Location: [facility];  Service: Endoscopy;;    Prior to Admission medications   Medication Sig Start Date End Date Taking? Authorizing Provider  amoxicillin-clavulanate (AUGMENTIN) 875-125 MG tablet Take 1 tablet by mouth 2 (two) times daily. 06/24/18 08/23/18 Yes Danford, Suann Larry, MD  ferrous sulfate 325 (65 FE) MG EC tablet Take 1 tablet (325 mg total) by mouth 3 (three) times daily with meals. Patient taking differently: Take 325 mg by mouth 2 (two) times daily.  06/18/17  Yes Ladell Pier, MD  hydrocortisone cream 1 % Apply 1 application topically 3 (three) times daily as needed for itching (minor skin irritation). 07/27/17  Yes Theodis Blaze, MD  HYDROmorphone (DILAUDID) 4 MG tablet Take 1-2 tablets (4-8 mg total) by mouth every 4 (four) hours as needed for severe pain. 07/07/18  Yes Owens Shark, NP  lactose free nutrition (BOOST PLUS) LIQD Take 237 mLs by mouth 3 (three) times daily with meals. 06/24/18  Yes Danford, Suann Larry, MD  lidocaine-prilocaine (EMLA) cream Apply to port site one hour prior to use. Do not rub in. Cover with plastic. Patient taking differently:  Apply 1 application topically daily as needed (prior to chemotherapy). Apply to port site one hour prior to use. Do not rub in. Cover with plastic. 11/17/17  Yes Owens Shark, NP  LORazepam (ATIVAN) 0.5 MG tablet Take 1 tablet (0.5 mg total) by mouth every 8 (eight) hours as needed for anxiety (or nausea). 06/30/18   Yes Ladell Pier, MD  magnesium citrate SOLN Take 0.75 Bottles by mouth daily as needed for mild constipation.    Yes [provider]  morphine (MS CONTIN) 15 MG 12 hr tablet Take 1 tablet (15 mg total) by mouth every 12 (twelve) hours. 04/14/18  Yes Ladell Pier, MD  predniSONE (DELTASONE) 5 MG tablet Take 1 tablet (5 mg total) by mouth daily. 06/30/18  Yes Ladell Pier, MD    Current Facility-Administered Medications  Medication Dose Route Frequency Provider Last Rate Last Dose  . acetaminophen (TYLENOL) tablet 650 mg  650 mg Oral Q6H PRN Amin, Ankit Chirag, MD       Or  . acetaminophen (TYLENOL) suppository 650 mg  650 mg Rectal Q6H PRN Amin, Ankit Chirag, MD      . bisacodyl (DULCOLAX) EC tablet 5 mg  5 mg Oral Daily PRN Amin, Ankit Chirag, MD      . ceFEPIme (MAXIPIME) 2 g in sodium chloride 0.9 % 100 mL IVPB  2 g Intravenous Q8H Luiz Ochoa, RPH 200 mL/hr at 07/12/18 0532 2 g at 07/12/18 0532  . feeding supplement (GLUCERNA SHAKE) (GLUCERNA SHAKE) liquid 237 mL  237 mL Oral TID BM Oswald Hillock, MD   237 mL at 07/12/18 1035  . ferrous sulfate tablet 325 mg  325 mg Oral BID WC Amin, Ankit Chirag, MD   325 mg at 07/12/18 0802  . HYDROcodone-acetaminophen (NORCO/VICODIN) 5-325 MG per tablet 1-2 tablet  1-2 tablet Oral Q4H PRN Amin, Ankit Chirag, MD      . HYDROmorphone (DILAUDID) tablet 4-8 mg  4-8 mg Oral Q4H PRN Damita Lack, MD   8 mg at 07/12/18 0714  . insulin aspart (novoLOG) injection 0-5 Units  0-5 Units Subcutaneous QHS Amin, Ankit Chirag, MD      . insulin aspart (novoLOG) injection 0-9 Units  0-9 Units Subcutaneous TID WC Amin, Jeanella Flattery, MD   3 Units at 07/11/18 1822  . LORazepam (ATIVAN) tablet 0.5 mg  0.5 mg Oral Q8H PRN Amin, Ankit Chirag, MD      . magnesium citrate solution 0.75 Bottle  0.75 Bottle Oral Daily PRN Amin, Ankit Chirag, MD      . morphine (MS CONTIN) 12 hr tablet 15 mg  15 mg Oral Q12H Amin, Ankit Chirag, MD   15 mg at 07/12/18  0936  . multivitamin with minerals tablet 1 tablet  1 tablet Oral Daily Darrick Meigs, Marge Duncans, MD      . ondansetron (ZOFRAN) tablet 4 mg  4 mg Oral Q6H PRN Amin, Ankit Chirag, MD       Or  . ondansetron (ZOFRAN) injection 4 mg  4 mg Intravenous Q6H PRN Amin, Ankit Chirag, MD      . polyethylene glycol (MIRALAX / GLYCOLAX) packet 17 g  17 g Oral Daily PRN Oswald Hillock, MD      . predniSONE (DELTASONE) tablet 5 mg  5 mg Oral Q breakfast Amin, Ankit Chirag, MD   5 mg at 07/12/18 0802  . simethicone (MYLICON) chewable tablet 80 mg  80 mg Oral QID Oswald Hillock, MD  80 mg at 07/12/18 0936    Allergies as of 07/09/2018 - Review Complete 07/09/2018  Allergen Reaction Noted  . Pork-derived products Other (See Comments) 05/17/2018    Family History  Problem Relation Age of Onset  . Hypertension Mother   . Arthritis Mother   . Diabetes Father   . Heart disease Father        ?valvular problems  . Hyperlipidemia Son   . Diabetes Brother     Social History   Socioeconomic History  . Marital status: Married    Spouse name: Not on file  . Number of children: 3  . Years of education: college grad--civics--states equivalent to High school here.  . Highest education level: Not on file  Occupational History  . Occupation: Leisure centre manager for an [employer]  . Financial resource strain: Not on file  . Food insecurity:    Worry: Not on file    Inability: Not on file  . Transportation needs:    Medical: Not on file    Non-medical: Not on file  Tobacco Use  . Smoking status: Never Smoker  . Smokeless tobacco: Never Used  Substance and Sexual Activity  . Alcohol use: No  . Drug use: No  . Sexual activity: Not on file  Lifestyle  . Physical activity:    Days per week: Not on file    Minutes per session: Not on file  . Stress: Not on file  Relationships  . Social connections:    Talks on phone: Not on file    Gets together: Not on file    Attends religious  service: Not on file    Active member of club or organization: Not on file    Attends meetings of clubs or organizations: Not on file    Relationship status: Not on file  . Intimate partner violence:    Fear of current or ex partner: Not on file    Emotionally abused: Not on file    Physically abused: Not on file    Forced sexual activity: Not on file  Other Topics Concern  . Not on file  Social History Narrative   Originally from [place]   Moved to [place]. In 34   Husband came in 2000   Lives at home with husband, 3 children and mother and father in Sports coach.    Review of Systems: Positive for: GI: Described in detail in HPI.    Gen:  fever, chills,anorexia, fatigue, weakness, malaise, involuntary weight loss, Denies any rigors, night sweats, and sleep disorder CV: Denies chest pain, angina, palpitations, syncope, orthopnea, PND, peripheral edema, and claudication. Resp: Denies dyspnea, cough, sputum, wheezing, coughing up blood. GU : Denies urinary burning, blood in urine, urinary frequency, urinary hesitancy, nocturnal urination, and urinary incontinence. MS: Denies joint pain or swelling.  Denies muscle weakness, cramps, atrophy.  Derm: Denies rash, itching, oral ulcerations, hives, unhealing ulcers.  Psych: Denies depression, anxiety, memory loss, suicidal ideation, hallucinations,  and confusion. Heme: Denies bruising, bleeding, and enlarged lymph nodes. Neuro:  Denies any headaches, dizziness, paresthesias. Endo:  Denies any problems with DM, thyroid, adrenal function.  Physical Exam: Vital signs in last 24 hours: Temp:  [98.2 F (36.8 C)-99 F (37.2 C)] 99 F (37.2 C) (11/25 0524) Pulse Rate:  [93-105] 93 (11/25 0524) Resp:  [16-20] 16 (11/25 0524) BP: (107-113)/(85-87) 112/86 (11/25 0524) SpO2:  [93 %-100 %] 100 % (11/25 0524) Last BM Date: 07/11/18  General:  Thinly built, cachetic appearing Head:  Normocephalic and atraumatic. Eyes: Icteric, pale Ears:  Normal  auditory acuity. Nose:  No deformity, discharge,  or lesions. Mouth:  No deformity or lesions.  Oropharynx pink & moist. Neck:  Supple; no masses or thyromegaly. Lungs:  Clear throughout to auscultation.   No wheezes, crackles, or rhonchi. No acute distress. Heart:  Regular rate and rhythm; no murmurs, clicks, rubs,  or gallops. Extremities:  Without clubbing or edema. Neurologic:  Alert and  oriented x4;  grossly normal neurologically. Skin:  Intact without significant lesions or rashes. Psych:  Alert and cooperative. Normal mood and affect. Abdomen:  Distended, presence of shifting dullness and fluid thrill. No masses, hepatosplenomegaly or hernias noted. Normal bowel sounds, without guarding, and without rebound.         Lab Results: Recent Labs    07/09/18 1717 07/10/18 0600 07/11/18 0501  WBC 25.6* 20.8* 18.1*  HGB 9.5* 8.0* 8.6*  HCT 30.8* 25.6* 28.4*  PLT 228 176 201   BMET Recent Labs    07/09/18 1717 07/10/18 0600 07/11/18 0501  NA 123* 126* 131*  K 5.0 4.5 4.2  CL 88* 94* 102  CO2 25 25 21*  GLUCOSE 204* 139* 90  BUN 37* 31* 23*  CREATININE 0.87 0.71 0.65  CALCIUM 8.4* 8.1* 8.3*   LFT Recent Labs    07/11/18 0501  PROT 5.8*  ALBUMIN 1.7*  AST 39  ALT 50*  ALKPHOS 237*  BILITOT 7.5*   PT/INR Recent Labs    07/10/18 0600  LABPROT 19.1*  INR 1.62    Studies/Results: No results found.  Impression: Pseudomonas bacteremia,ERCP with new uncovered metal stent placed on 07/05/18, elevated but improved liver enzymes Pancreatic cancer stage IV Status post multiple cycles of chemotherapy,last cycle of chemotherapy on 06/02/18 Malignant ascites, started on Lasix and Aldactone on 06/05/18, last paracentesis on 06/22/18  Plan: Started on broad-spectrum antibiotics Recommend MRCP to evaluate for stent migration-less likely given LFTs improved post ERCP, although they still remain high(could be related to progressive cystic lesions noted in the central  left hepatic lobe, posterior lateral right hepatic lobe)   LOS: 3 days   Ronnette Juniper, M.D.  07/12/2018, 12:11 PM  Pager [phone] If no answer or after 5 PM call [phone]

## 2018-07-12 NOTE — Progress Notes (Signed)
IP PROGRESS NOTE  Subjective:   Crystal Carlson continues to feel somnolent.  Lower abdominal pain is relieved with Dilaudid.  Objective: Vital signs in last 24 hours: Blood pressure 112/86, pulse 93, temperature 99 F (37.2 C), temperature source Oral, resp. rate 16, height 5\' 5"  (1.651 m), weight 107 lb (48.5 kg), SpO2 100 %.  Intake/Output from previous day: 11/24 0701 - 11/25 0700 In: 1135 [P.O.:835; IV Piggyback:300] Out: -   Physical Exam:  HEENT: Scleral icterus, no thrush Lungs: Clear bilaterally, no respiratory distress Cardiac: Regular rate and rhythm Abdomen: Mildly distended, nontender Extremities: No leg edema Neurologic: Alert, follows commands  Portacath/PICC-without erythema or tenderness  Lab Results: Recent Labs    07/10/18 0600 07/11/18 0501  WBC 20.8* 18.1*  HGB 8.0* 8.6*  HCT 25.6* 28.4*  PLT 176 201    BMET Recent Labs    07/10/18 0600 07/11/18 0501  NA 126* 131*  K 4.5 4.2  CL 94* 102  CO2 25 21*  GLUCOSE 139* 90  BUN 31* 23*  CREATININE 0.71 0.65  CALCIUM 8.1* 8.3*    Medications: I have reviewed the patient's current medications.  Assessment/Plan:  1. 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 Adenocarcinoma consistent with pancreatobiliary primary.  Cycle 1 FOLFIRINOX 06/18/2017  Cycle 2FOLFIRINOX 07/02/2017  Cycle 3 FOLFIRINOX 07/16/2017 (oxaliplatin dose reduced due to mild thrombocytopenia)  CT 07/24/2017-new splenomegaly and left hydronephrosis  Cycle 4 FOLFIRINOX 08/08/2017  Cycle 5  FOLFIRINOX 08/24/2017  CT abdomen/pelvis 09/03/2017-stable liver lesions and pancreas mass, stable right biliary dilatation, possible new 8 mm right liver lesion versus a vascular phenomena, lesion at the right paracolic gutter was present in the past-significance unclear  Cycle 6 FOLFIRINOX1/29/2019  Cycle7 FOLFIRINOX2/19/2019 (Irinotecan andOxaliplatin dose reduced due to thrombocytopenia)  CT abdomen/pelvis 10/25/2017- findings concerning for enterocolitis. No bowel obstruction. Ill-defined pancreatic head mass. Multiple hepatic hypoenhancing lesions with the largest measuring 1.9 x 2.0 cm, previously 1.7 x 1.7 cm; 9 x 10 mm hypoenhancing lesion in the inferior right lobe of the liver slightly larger compared to the previous CT; 1.6 x 2.4 cm enhancing lesion in the right paracolic gutter increased in size compared to the prior CT.  Cycle 1 gemcitabine/Abraxane 11/04/2017  Cycle 2 gemcitabine/Abraxane 11/25/2017  Cycle 3 gemcitabine/Abraxane 12/08/2017  Cycle 4 gemcitabine 12/23/2017 (Abraxane held secondary to neuropathy)  CTs abdomen/pelvis 01/12/2018- decrease in size of the pancreatic mass and hepatic metastasis; slight interval decrease in size of right paracolic gutter implant; worsening abdominal/pelvic ascites. Bowel wall thickening likely due to ascites and low albumin. 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  Cycle 5 gemcitabine 01/13/2018 (Abraxane held secondary to neuropathy)  Cycle 6 gemcitabine/Abraxane 01/27/2018  CT abdomen/pelvis 02/01/2018- dominant metastases in the liver are unchanged, multiple new low-density lesions throughout the right lobe-small abscesses?, No change in pancreas mass  Cycle 7 gemcitabine/Abraxane 02/16/2018(gemcitabine and Abraxane dose reduced due to previous neutropenia)  Cycle 8 gemcitabine/Abraxane 03/03/2018  Cycle 9 gemcitabine/Abraxane 03/25/2018  Cycle 10 gemcitabine/Abraxane 04/14/2018  Restaging CTs  05/04/2018- similar ill-defined pancreatic mass; multiple liver metastases again noted, appears slightly improved; increase in volume of abdominal and pelvic ascites; diffuse small bowel wall  edema and mild edema of the ascending colon and transverse colon.  Cycle 11 gemcitabine/Abraxane 05/05/2018  Cycle 12 gemcitabine/Abraxane 06/02/2018  CT 06/16/2018-stable pancreas mass extending into retroperitoneal soft tissue, multiple low-density lesions in the right liver-increased in size probable superior mesenteric vein thrombosis, mild left hydronephrosis  Ultrasound aspiration of a liver lesion 06/22/2018-negative cytology and culture   2. Pain secondary to #1.Improved.  3. Weight losssecondary to #1.  4. Microcytic anemia  Transfused with packed red blood cells 07/25/2017, 16 2019, and 11/27/2017  5. History of thrombocytopenia secondary to chemotherapy  6. Port-A-Cath placement 06/04/2017  7. Elevated liver enzymes/bilirubin10/22/2018-likely early biliary obstruction secondary to pancreas cancer, status post ERCP-placement of a common bile duct stent 06/09/2018  8.Postprandial abdominal pain. Protonix initiated 07/16/2017.Trial of Reglan initiated 08/24/2017.  9.Admission with fever 07/24/2017-no source for infection identified, resolved   10.Diarrheafollowing cycle 3 FOLFIRINOX-resolved  11.Left hydronephrosis noted on CT12/03/2017-etiology unclear, urology consulted-stent not recommended.Mild left hydronephrosis noted on CT 10/25/2017. Creatinine normal 10/27/2017.  12. Admission 08/31/2017 with a high fever, blood culture positive for gram-negative rods-Klebsiella pneumoniae identified  13.Fever 10/25/2017 status post evaluation in the emergency department. CT with findings concerning for enterocolitis.   14. Admission 11/11/2017 with a high fever-blood cultures (peripheral and Port-A-Cath) positive for gram-negative rods--STENOTROPHOMONAS  MALTOPHILIA;course of Septra completed as an outpatient.  15. Rash-potentially secondary to gemcitabine or infection, improved  16. Pancytopenia secondary to chemotherapy  17.Intermittent fever. Question "tumor fever", question infection. Now on Bactrim prophylaxis, improved  18. Ascites.Paracentesis 01/14/2018- reactive mesothelial cells, mixed inflammation;culture positive for Klebsiella and enterococcustreated withAugmentin  Admission with fever 01/30/2018, blood cultures negative, discharged on ciprofloxacin/Flagyl  Recurrent ascites, paracentesis 02/23/2018-culture positive for strep viridans, placed on Levaquin  Paracentesis 03/03/2018- 2 L of fluid removed, culture and cytology negative  Trialof Aldactone/Lasix started 05/26/2018  19.Status post evaluation back pain in the emergency department 04/23/2018-MRI lumbar spine with L5-S1 left subarticular disc extrusion  L5-S1laminotomyand microdiscectomy 05/17/2018, pain resolved  20. Admission 06/16/2018 with fever and failure to thrive- cultures negative, suspect liver abscesses 21.  Elevated liver enzymes and bilirubin 07/02/2018  ERCP 07/05/2018- migration of previously noted biliary stent with dilated biliary tree, new uncovered metal stent placed in the common bile duct 22.  Failure to thrive 07/08/2018- blood culture positive for pseudomonas aeruginosa, repeat blood cultures 07/09/2018-positive for pseudomonas aeruginosa, admitted 07/09/2018 and started on cefepime  Ms. Tieszen appears unchanged.  Multiple blood cultures are positive for Pseudomonas.  I suspect the Pseudomonas bacteremia is related to biliary obstruction.  She underwent placement of a new bile duct stent on 07/05/2018.  The hyperbilirubinemia has not improved following stent placement.  It is possible the Port-A-Cath is secondarily infected, but there is no clinical evidence of a Port-A-Cath infection.  She has a history of metastatic pancreas  cancer.  She is currently being treated with gemcitabine/Abraxane.  The abdominal pain is likely secondary to pancreas cancer.  There is been no clear evidence of disease progression while on gemcitabine/Abraxane.  I suspect the somnolence is related to bacteremia versus polypharmacy.  She has been maintained on MS Contin chronically.  Recommendations: 1.  Continue cefepime 2.  Consult Dr. Watt Climes for management of the biliary obstruction 3.  Continue narcotic analgesics for pain 4.  Keep Port-A-Cath in place for now        LOS: 3 days   Betsy Coder, MD   07/12/2018, 2:33 PM

## 2018-07-12 NOTE — Progress Notes (Signed)
[facility]  PROGRESS NOTE  Crystal Carlson JKK:[id number] DOB: 1966/06/14 DOA: 07/09/2018 PCP: Mack Hook, MD   Brief HPI:   52 year old female with a history of stage IV metastatic pancreatic cancer on chemotherapy, chronic anemia who was sent to the ED after blood cultures done as outpatient was positive for Pseudomonas.  She was admitted here 5 weeks ago with fever and chills for worsening metastatic disease and was found to have possible liver abscess and was discharged oral antibiotics.  ID and oncology has been following patient for Pseudomonas bacteremia.    Subjective   Patient seen and examined, feels drowsy today.  Continues to have abdominal pain.  She is on Dilaudid for pain control   Assessment/Plan:     1. Pseudomonas bacteremia-outpatient blood cultures grew Pseudomonas.  Patient started on cefepime.  Repeat blood cultures from 07/11/2018 are negative.  Patient has Port-A-Cath in place, called and discussed with Dr. Malachy Mood who recommends to call GI consult for likely stent replacement as it may be the cause of the infection.  2. Leukocytosis-improving, second to bacteremia.  Today WBC is 18,000.  Will follow CBC in a.m.  3. Stage IV pancreatic cancer-followed by oncology as outpatient.  She was on chemotherapy.  Recently had biliary stent exchange by GI for metastatic pancreatic cancer on 07/05/2018.  ID consulted GI, [facility] recommends MRCP to check for stent position.  Will order MRCP with contrast.  4. Severe protein calorie malnutrition-nutrition consulted  5. Hyponatremia-improved with IV fluids.  Today sodium 131.  6. Diabetes mellitus type 2-continue sliding scale insulin with NovoLog, CBG stable.      CBG: Recent Labs  Lab 07/11/18 1149 07/11/18 1651 07/11/18 2033 07/12/18 0740 07/12/18 1221  GLUCAP 146* 229* 128* 102* 175*    CBC: Recent Labs  Lab 07/08/18 1112 07/09/18 1717 07/10/18 0600 07/11/18 0501  WBC 25.9* 25.6* 20.8*  18.1*  NEUTROABS 23.8* 23.3*  --  14.9*  HGB 9.9* 9.5* 8.0* 8.6*  HCT 30.5* 30.8* 25.6* 28.4*  MCV 80.1 82.4 83.4 85.0  PLT 182 228 176 716    Basic Metabolic Panel: Recent Labs  Lab 07/08/18 1112 07/09/18 1717 07/10/18 0600 07/11/18 0501  NA 126* 123* 126* 131*  K 5.3* 5.0 4.5 4.2  CL 89* 88* 94* 102  CO2 27 25 25  21*  GLUCOSE 234* 204* 139* 90  BUN 39* 37* 31* 23*  CREATININE 1.25* 0.87 0.71 0.65  CALCIUM 9.3 8.4* 8.1* 8.3*  MG  --  2.4  --   --      DVT prophylaxis: Heparin  Code Status: Full code  Family Communication: Discussed with family member at bedside  Disposition Plan: likely home when medically ready for discharge   Consultants:  ID  Oncology  Procedures: None   Antibiotics:   Anti-infectives (From admission, onward)   Start     Dose/Rate Route Frequency Ordered Stop   07/10/18 1400  ceFEPIme (MAXIPIME) 2 g in sodium chloride 0.9 % 100 mL IVPB     2 g 200 mL/hr over 30 Minutes Intravenous Every 8 hours 07/10/18 1344     07/09/18 1730  ceFEPIme (MAXIPIME) 2 g in sodium chloride 0.9 % 100 mL IVPB  Status:  Discontinued     2 g 200 mL/hr over 30 Minutes Intravenous Every 12 hours 07/09/18 1649 07/10/18 1344       Objective   Vitals:   07/11/18 1116 07/11/18 1338 07/11/18 2035 07/12/18 0524  BP:  113/85 107/87 112/86  Pulse:  Marland Kitchen)  105 94 93  Resp:  20 16 16   Temp:  98.2 F (36.8 C) 98.9 F (37.2 C) 99 F (37.2 C)  TempSrc:  Oral Oral Oral  SpO2: 96% 93% 99% 100%  Weight:      Height:        Intake/Output Summary (Last 24 hours) at 07/12/2018 1425 Last data filed at 07/12/2018 0931 Gross per 24 hour  Intake 895 ml  Output -  Net 895 ml   Filed Weights   07/10/18 0434 07/10/18 0500  Weight: 49.3 kg 48.5 kg     Physical Examination:     General: Appears lethargic, cachectic  Cardiovascular: S1-S2, regular  Respiratory: Clear to auscultation bilaterally  Abdomen: Soft, distended, mild diffuse tenderness to  palpation, no organomegaly  Musculoskeletal: No edema in the lower extremities      Data Reviewed: I have personally reviewed following labs and imaging studies   Recent Results (from the past 240 hour(s))  Culture, Blood     Status: Abnormal   Collection Time: 07/08/18 11:12 AM  Result Value Ref Range Status   Specimen Description BLOOD PORTA CATH RIGHT  Final   Special Requests   Final    BOTTLES DRAWN AEROBIC AND ANAEROBIC Blood Culture adequate volume   Culture  Setup Time   Final    GRAM NEGATIVE RODS IN BOTH AEROBIC AND ANAEROBIC BOTTLES CRITICAL RESULT CALLED TO, READ BACK BY AND VERIFIED WITH: S CARDWELL RN [id number] BY GF Performed at [facility], [place] [address], [place], [place] [address]    Culture PSEUDOMONAS AERUGINOSA (A)  Final   Report Status 07/11/2018 FINAL  Final   Organism ID, Bacteria PSEUDOMONAS AERUGINOSA  Final      Susceptibility   Pseudomonas aeruginosa - MIC*    CEFTAZIDIME <=1 SENSITIVE Sensitive     CIPROFLOXACIN >=4 RESISTANT Resistant     GENTAMICIN <=1 SENSITIVE Sensitive     IMIPENEM 1 SENSITIVE Sensitive     PIP/TAZO <=4 SENSITIVE Sensitive     CEFEPIME <=1 SENSITIVE Sensitive     * PSEUDOMONAS AERUGINOSA  Blood Culture ID Panel (Reflexed)     Status: Abnormal   Collection Time: 07/08/18 11:12 AM  Result Value Ref Range Status   Enterococcus species NOT DETECTED NOT DETECTED Final   Listeria monocytogenes NOT DETECTED NOT DETECTED Final   Staphylococcus species NOT DETECTED NOT DETECTED Final   Staphylococcus aureus (BCID) NOT DETECTED NOT DETECTED Final   Streptococcus species NOT DETECTED NOT DETECTED Final   Streptococcus agalactiae NOT DETECTED NOT DETECTED Final   Streptococcus pneumoniae NOT DETECTED NOT DETECTED Final   Streptococcus pyogenes NOT DETECTED NOT DETECTED Final   Acinetobacter baumannii NOT DETECTED NOT DETECTED Final   Enterobacteriaceae species NOT DETECTED NOT DETECTED Final   Enterobacter cloacae  complex NOT DETECTED NOT DETECTED Final   Escherichia coli NOT DETECTED NOT DETECTED Final   Klebsiella oxytoca NOT DETECTED NOT DETECTED Final   Klebsiella pneumoniae NOT DETECTED NOT DETECTED Final   Proteus species NOT DETECTED NOT DETECTED Final   Serratia marcescens NOT DETECTED NOT DETECTED Final   Carbapenem resistance NOT DETECTED NOT DETECTED Final   Haemophilus influenzae NOT DETECTED NOT DETECTED Final   Neisseria meningitidis NOT DETECTED NOT DETECTED Final   Pseudomonas aeruginosa DETECTED (A) NOT DETECTED Final    Comment: CRITICAL RESULT CALLED TO, READ BACK BY AND VERIFIED WITH: S CAEDWELL RN [id number] BY GF    Candida albicans NOT DETECTED NOT DETECTED Final  Candida glabrata NOT DETECTED NOT DETECTED Final   Candida krusei NOT DETECTED NOT DETECTED Final   Candida parapsilosis NOT DETECTED NOT DETECTED Final   Candida tropicalis NOT DETECTED NOT DETECTED Final    Comment: Performed at [facility], [place] [address] [place], [place] [address]  Culture, blood (Routine X 2) w Reflex to ID Panel     Status: Abnormal   Collection Time: 07/09/18  5:18 PM  Result Value Ref Range Status   Specimen Description   Final    BLOOD BLOOD LEFT ARM Performed at [facility] [address] [place], [place] [address]    Special Requests   Final    BOTTLES DRAWN AEROBIC AND ANAEROBIC Blood Culture adequate volume Performed at [facility] [address] [place], [place] [address]    Culture  Setup Time   Final    GRAM NEGATIVE RODS AEROBIC BOTTLE ONLY CRITICAL VALUE NOTED.  VALUE IS CONSISTENT WITH PREVIOUSLY REPORTED AND CALLED VALUE.    Culture (A)  Final    PSEUDOMONAS AERUGINOSA SUSCEPTIBILITIES PERFORMED ON PREVIOUS CULTURE WITHIN THE LAST 5 DAYS. Performed at [facility], [place] [address], [place], [place] [address]    Report Status 07/12/2018 FINAL  Final  Culture, blood (Routine X 2) w Reflex to ID Panel      Status: Abnormal   Collection Time: 07/09/18  6:19 PM  Result Value Ref Range Status   Specimen Description   Final    BLOOD PORTA CATH Performed at [facility] [address] [place], [place] [address]    Special Requests   Final    BOTTLES DRAWN AEROBIC AND ANAEROBIC Blood Culture adequate volume Performed at [facility] [address], [place], [place] [address]    Culture  Setup Time   Final    GRAM NEGATIVE RODS IN BOTH AEROBIC AND ANAEROBIC BOTTLES CRITICAL VALUE NOTED.  VALUE IS CONSISTENT WITH PREVIOUSLY REPORTED AND CALLED VALUE.    Culture (A)  Final    PSEUDOMONAS AERUGINOSA SUSCEPTIBILITIES PERFORMED ON PREVIOUS CULTURE WITHIN THE LAST 5 DAYS. Performed at [facility], [place] [address], [place], [place] [address]    Report Status 07/12/2018 FINAL  Final  Culture, blood (Routine X 2) w Reflex to ID Panel     Status: None (Preliminary result)   Collection Time: 07/11/18  8:00 AM  Result Value Ref Range Status   Specimen Description   Final    BLOOD RIGHT ANTECUBITAL Performed at [facility] [address], [place], [place] [address]    Special Requests   Final    BOTTLES DRAWN AEROBIC ONLY Blood Culture adequate volume Performed at [facility] [address], [place], [place] [address]    Culture   Final    NO GROWTH 1 DAY Performed at [facility], [place] [address] [place],  [address]    Report Status PENDING  Incomplete     Liver Function Tests: Recent Labs  Lab 07/08/18 1112 07/09/18 1717 07/10/18 0600 07/11/18 0501  AST 52* 38 29 39  ALT 103* 73* 56* 50*  ALKPHOS 332* 258* 212* 237*  BILITOT 9.0* 9.0* 7.2* 7.5*  PROT 7.3 6.9 5.8* 5.8*  ALBUMIN 2.1* 2.0* 1.8* 1.7*   No results for input(s): LIPASE, AMYLASE in the last 168 hours. Recent Labs  Lab 07/09/18 1722  AMMONIA 55*    Cardiac Enzymes: No results for input(s):  CKTOTAL, CKMB, CKMBINDEX,  TROPONINI in the last 168 hours. BNP (last 3 results) No results for input(s): BNP in the last 8760 hours.  ProBNP (last 3 results) No results for input(s): PROBNP in the last 8760 hours.    Studies: No results found.  Scheduled Meds: . feeding supplement (GLUCERNA SHAKE)  237 mL Oral TID BM  . ferrous sulfate  325 mg Oral BID WC  . insulin aspart  0-5 Units Subcutaneous QHS  . insulin aspart  0-9 Units Subcutaneous TID WC  . morphine  15 mg Oral Q12H  . multivitamin with minerals  1 tablet Oral Daily  . predniSONE  5 mg Oral Q breakfast  . simethicone  80 mg Oral QID    Admission status: Inpatient: Based on patients clinical presentation and evaluation of above clinical data, I have made determination that patient meets Inpatient criteria at this time.  Time spent: 20 min  [facility] Pager [phone]. If 7PM-7AM, please contact night-coverage at [email or web address], Office  [phone]  password [email or web address]  07/12/2018, 2:25 PM  LOS: 3 days

## 2018-07-12 NOTE — Progress Notes (Signed)
Initial Nutrition Assessment  DOCUMENTATION CODES:   Severe malnutrition in context of chronic illness  INTERVENTION:   -Continue Glucerna Shake po TID, each supplement provides 220 kcal and 10 grams of protein -Provide Multivitamin with minerals daily  NUTRITION DIAGNOSIS:   Severe Malnutrition related to chronic illness, cancer and cancer related treatments as evidenced by severe fat depletion, severe muscle depletion.  GOAL:   Patient will meet greater than or equal to 90% of their needs  MONITOR:   PO intake, Supplement acceptance, Labs, Weight trends, I & O's  REASON FOR ASSESSMENT:   Malnutrition Screening Tool    ASSESSMENT:   52 year old female with a history of stage IV metastatic pancreatic cancer on chemotherapy, chronic anemia who was sent to the ED after blood cultures done as outpatient was positive for Pseudomonas.  She was admitted here 5 weeks ago with fever and chills for worsening metastatic disease and was found to have possible liver abscess and was discharged oral antibiotics.   Patient just recently admitted earlier this month. Pt was provided Boost supplements at that time and diagnosed with severe malnutrition. Pt is currently eating well this admission, 100% of meals providing adequate calories and protein. Glucerna shakes have been ordered this admission but pt is drinking them.   Per weight records, pt's weight continues to fluctuate given fluid and ascites. Pt's UBW is recorded as 145 lb. Pt with significant weight loss but unable to determine what is lean body mass and fluid lost. The lowest the patient has weighed was 98 lb on 11/21.  Severe malnutrition continues.  Labs reviewed: CBGs: 102-128 Low Na Medications: Ferrous sulfate tablet BID   NUTRITION - FOCUSED PHYSICAL EXAM:    Most Recent Value  Orbital Region  Moderate depletion  Upper Arm Region  Severe depletion  Thoracic and Lumbar Region  Unable to assess  Buccal Region  Severe  depletion  Temple Region  Moderate depletion  Clavicle Bone Region  Severe depletion  Clavicle and Acromion Bone Region  Severe depletion  Scapular Bone Region  Unable to assess  Dorsal Hand  Moderate depletion  Patellar Region  Severe depletion  Anterior Thigh Region  Severe depletion  Posterior Calf Region  Severe depletion  Edema (RD Assessment)  None       Diet Order:   Diet Order            Diet regular Room service appropriate? Yes; Fluid consistency: Thin  Diet effective now              EDUCATION NEEDS:   No education needs have been identified at this time  Skin:  Skin Assessment: Reviewed RN Assessment  Last BM:  11/24  Height:   Ht Readings from Last 1 Encounters:  07/09/18 5\' 5"  (1.651 m)    Weight:   Wt Readings from Last 1 Encounters:  07/10/18 48.5 kg    Ideal Body Weight:  56.8 kg  BMI:  Body mass index is 17.81 kg/m.  Estimated Nutritional Needs:   Kcal:  1500-1700  Protein:  80-90g  Fluid:  1.5L/day   Clayton Bibles, MS, RD, LDN [facility] Pager: [phone] After Hours Pager: [phone]

## 2018-07-13 ENCOUNTER — Inpatient Hospital Stay: Payer: Medicaid Other

## 2018-07-13 ENCOUNTER — Inpatient Hospital Stay: Payer: Medicaid Other | Admitting: Nurse Practitioner

## 2018-07-13 ENCOUNTER — Inpatient Hospital Stay (HOSPITAL_COMMUNITY): Payer: Medicaid Other

## 2018-07-13 ENCOUNTER — Encounter (HOSPITAL_COMMUNITY): Payer: Self-pay | Admitting: Interventional Radiology

## 2018-07-13 DIAGNOSIS — C801 Malignant (primary) neoplasm, unspecified: Secondary | ICD-10-CM

## 2018-07-13 DIAGNOSIS — R7881 Bacteremia: Secondary | ICD-10-CM

## 2018-07-13 DIAGNOSIS — K831 Obstruction of bile duct: Secondary | ICD-10-CM

## 2018-07-13 DIAGNOSIS — T80211D Bloodstream infection due to central venous catheter, subsequent encounter: Secondary | ICD-10-CM

## 2018-07-13 DIAGNOSIS — R5383 Other fatigue: Secondary | ICD-10-CM

## 2018-07-13 DIAGNOSIS — T80211A Bloodstream infection due to central venous catheter, initial encounter: Secondary | ICD-10-CM

## 2018-07-13 DIAGNOSIS — R933 Abnormal findings on diagnostic imaging of other parts of digestive tract: Secondary | ICD-10-CM

## 2018-07-13 DIAGNOSIS — D509 Iron deficiency anemia, unspecified: Secondary | ICD-10-CM

## 2018-07-13 HISTORY — PX: IR PARACENTESIS: IMG2679

## 2018-07-13 LAB — PROTIME-INR
INR: 1.71
PROTHROMBIN TIME: 19.9 s — AB (ref 11.4–15.2)

## 2018-07-13 LAB — GLUCOSE, CAPILLARY
GLUCOSE-CAPILLARY: 115 mg/dL — AB (ref 70–99)
Glucose-Capillary: 157 mg/dL — ABNORMAL HIGH (ref 70–99)
Glucose-Capillary: 99 mg/dL (ref 70–99)
Glucose-Capillary: 151 mg/dL — ABNORMAL HIGH (ref 70–99)

## 2018-07-13 LAB — CBC
HCT: 27.1 % — ABNORMAL LOW (ref 36.0–46.0)
Hemoglobin: 8.3 g/dL — ABNORMAL LOW (ref 12.0–15.0)
MCH: 25.9 pg — AB (ref 26.0–34.0)
MCHC: 30.6 g/dL (ref 30.0–36.0)
MCV: 84.7 fL (ref 80.0–100.0)
NRBC: 0 % (ref 0.0–0.2)
PLATELETS: 189 10*3/uL (ref 150–400)
RBC: 3.2 MIL/uL — ABNORMAL LOW (ref 3.87–5.11)
RDW: 25.6 % — AB (ref 11.5–15.5)
WBC: 27.6 10*3/uL — ABNORMAL HIGH (ref 4.0–10.5)

## 2018-07-13 LAB — TYPE AND SCREEN
ABO/RH(D): O POS
Antibody Screen: NEGATIVE

## 2018-07-13 MED ORDER — MORPHINE SULFATE ER 15 MG PO TBCR: 15.0000 mg | EXTENDED_RELEASE_TABLET | Freq: Every day | ORAL | Status: DC

## 2018-07-13 MED ORDER — SODIUM CHLORIDE 0.9 % IV BOLUS
500.0000 mL | Freq: Once | INTRAVENOUS | Status: AC
  Administered 2018-07-13: 500 mL via INTRAVENOUS

## 2018-07-13 MED ORDER — IBUPROFEN 200 MG PO TABS
400.0000 mg | ORAL_TABLET | Freq: Once | ORAL | Status: AC
  Administered 2018-07-13: 400 mg via ORAL
  Filled 2018-07-13: qty 2

## 2018-07-13 MED ORDER — LIDOCAINE HCL 1 % IJ SOLN
INTRAMUSCULAR | Status: AC
  Filled 2018-07-13: qty 20

## 2018-07-13 MED ORDER — MORPHINE SULFATE ER 15 MG PO TBCR
15.0000 mg | EXTENDED_RELEASE_TABLET | Freq: Every day | ORAL | Status: DC
  Administered 2018-07-13: 15 mg via ORAL
  Filled 2018-07-13: qty 1

## 2018-07-13 MED ORDER — IOPAMIDOL (ISOVUE-300) INJECTION 61%
INTRAVENOUS | Status: AC
  Filled 2018-07-13: qty 50

## 2018-07-13 MED ORDER — SODIUM CHLORIDE 0.9% IV SOLUTION
Freq: Once | INTRAVENOUS | Status: AC
  Administered 2018-07-13: 12:00:00 via INTRAVENOUS

## 2018-07-13 NOTE — Progress Notes (Signed)
IP PROGRESS NOTE  Subjective:   Crystal Carlson reports continued somnolence.  She feels better compared to on hospital admission.  Objective: Vital signs in last 24 hours: Blood pressure 106/77, pulse 94, temperature 98.2 F (36.8 C), temperature source Oral, resp. rate 17, height 5\' 5"  (1.651 m), weight 123 lb 3.8 oz (55.9 kg), SpO2 100 %.  Intake/Output from previous day: 11/25 0701 - 11/26 0700 In: 1180 [P.O.:480; IV Piggyback:700] Out: -   Physical Exam:  HEENT: Scleral icterus, no thrush Lungs: Clear bilaterally, no respiratory distress Cardiac: Regular rate and rhythm Abdomen: Distended with ascites r Extremities: No leg edema Neurologic: Alert, follows commands  Portacath/PICC-without erythema or tenderness  Lab Results: Recent Labs    07/11/18 0501 07/13/18 0541  WBC 18.1* 27.6*  HGB 8.6* 8.3*  HCT 28.4* 27.1*  PLT 201 189    BMET Recent Labs    07/11/18 0501  NA 131*  K 4.2  CL 102  CO2 21*  GLUCOSE 90  BUN 23*  CREATININE 0.65  CALCIUM 8.3*    Medications: I have reviewed the patient's current medications.  Assessment/Plan:  1. 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 Adenocarcinoma consistent with pancreatobiliary primary.  Cycle 1 FOLFIRINOX 06/18/2017  Cycle 2FOLFIRINOX 07/02/2017  Cycle 3 FOLFIRINOX 07/16/2017 (oxaliplatin dose reduced due to mild thrombocytopenia)  CT 07/24/2017-new splenomegaly and left hydronephrosis  Cycle 4 FOLFIRINOX 08/08/2017  Cycle 5 FOLFIRINOX 08/24/2017  CT abdomen/pelvis  09/03/2017-stable liver lesions and pancreas mass, stable right biliary dilatation, possible new 8 mm right liver lesion versus a vascular phenomena, lesion at the right paracolic gutter was present in the past-significance unclear  Cycle 6 FOLFIRINOX1/29/2019  Cycle7 FOLFIRINOX2/19/2019 (Irinotecan andOxaliplatin dose reduced due to thrombocytopenia)  CT abdomen/pelvis 10/25/2017- findings concerning for enterocolitis. No bowel obstruction. Ill-defined pancreatic head mass. Multiple hepatic hypoenhancing lesions with the largest measuring 1.9 x 2.0 cm, previously 1.7 x 1.7 cm; 9 x 10 mm hypoenhancing lesion in the inferior right lobe of the liver slightly larger compared to the previous CT; 1.6 x 2.4 cm enhancing lesion in the right paracolic gutter increased in size compared to the prior CT.  Cycle 1 gemcitabine/Abraxane 11/04/2017  Cycle 2 gemcitabine/Abraxane 11/25/2017  Cycle 3 gemcitabine/Abraxane 12/08/2017  Cycle 4 gemcitabine 12/23/2017 (Abraxane held secondary to neuropathy)  CTs abdomen/pelvis 01/12/2018- decrease in size of the pancreatic mass and hepatic metastasis; slight interval decrease in size of right paracolic gutter implant; worsening abdominal/pelvic ascites. Bowel wall thickening likely due to ascites and low albumin. 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  Cycle 5 gemcitabine 01/13/2018 (Abraxane held secondary to neuropathy)  Cycle 6 gemcitabine/Abraxane 01/27/2018  CT abdomen/pelvis 02/01/2018- dominant metastases in the liver are unchanged, multiple new low-density lesions throughout the right lobe-small abscesses?, No change in pancreas mass  Cycle 7 gemcitabine/Abraxane 02/16/2018(gemcitabine and Abraxane dose reduced due to previous neutropenia)  Cycle 8 gemcitabine/Abraxane 03/03/2018  Cycle 9 gemcitabine/Abraxane 03/25/2018  Cycle 10 gemcitabine/Abraxane 04/14/2018  Restaging CTs 05/04/2018- similar ill-defined pancreatic mass;  multiple liver metastases again noted, appears slightly improved; increase in volume of abdominal and pelvic ascites; diffuse small bowel wall edema and mild edema of the ascending  colon and transverse colon.  Cycle 11 gemcitabine/Abraxane 05/05/2018  Cycle 12 gemcitabine/Abraxane 06/02/2018  CT 06/16/2018-stable pancreas mass extending into retroperitoneal soft tissue, multiple low-density lesions in the right liver-increased in size probable superior mesenteric vein thrombosis, mild left hydronephrosis  Ultrasound aspiration of a liver lesion 06/22/2018-negative cytology and culture  MRI abdomen/MRCP on 07/12/2018- pancreatic head mass with vascular involvement and extension into the retroperitoneum with involvement of the aorta and IVC, multifocal liver lesions-some are increased felt to represent infected metastases or hepatic abscesses, progressive left intrahepatic biliary dilatation with mild biliary dilatation at the right liver   2. Pain secondary to #1.Improved.  3. Weight losssecondary to #1.  4. Microcytic anemia  Transfused with packed red blood cells 07/25/2017, 16 2019, and 11/27/2017  5. History of thrombocytopenia secondary to chemotherapy  6. Port-A-Cath placement 06/04/2017  7. Elevated liver enzymes/bilirubin10/22/2018-likely early biliary obstruction secondary to pancreas cancer, status post ERCP-placement of a common bile duct stent 06/09/2018  8.Postprandial abdominal pain. Protonix initiated 07/16/2017.Trial of Reglan initiated 08/24/2017.  9.Admission with fever 07/24/2017-no source for infection identified, resolved   10.Diarrheafollowing cycle 3 FOLFIRINOX-resolved  11.Left hydronephrosis noted on CT12/03/2017-etiology unclear, urology consulted-stent not recommended.Mild left hydronephrosis noted on CT 10/25/2017. Creatinine normal 10/27/2017.  12. Admission 08/31/2017 with a high fever, blood culture positive for  gram-negative rods-Klebsiella pneumoniae identified  13.Fever 10/25/2017 status post evaluation in the emergency department. CT with findings concerning for enterocolitis.   14. Admission 11/11/2017 with a high fever-blood cultures (peripheral and Port-A-Cath) positive for gram-negative rods--STENOTROPHOMONAS MALTOPHILIA;course of Septra completed as an outpatient.  15. Rash-potentially secondary to gemcitabine or infection, improved  16. Pancytopenia secondary to chemotherapy  17.Intermittent fever. Question "tumor fever", question infection. Now on Bactrim prophylaxis, improved  18. Ascites.Paracentesis 01/14/2018- reactive mesothelial cells, mixed inflammation;culture positive for Klebsiella and enterococcustreated withAugmentin  Admission with fever 01/30/2018, blood cultures negative, discharged on ciprofloxacin/Flagyl  Recurrent ascites, paracentesis 02/23/2018-culture positive for strep viridans, placed on Levaquin  Paracentesis 03/03/2018- 2 L of fluid removed, culture and cytology negative  Trialof Aldactone/Lasix started 05/26/2018  19.Status post evaluation back pain in the emergency department 04/23/2018-MRI lumbar spine with L5-S1 left subarticular disc extrusion  L5-S1laminotomyand microdiscectomy 05/17/2018, pain resolved  20. Admission 06/16/2018 with fever and failure to thrive- cultures negative, suspect liver abscesses 21.  Elevated liver enzymes and bilirubin 07/02/2018  ERCP 07/05/2018- migration of previously noted biliary stent with dilated biliary tree, new uncovered metal stent placed in the common bile duct 22.  Failure to thrive 07/08/2018- blood culture positive for pseudomonas aeruginosa, repeat blood cultures 07/09/2018-positive for pseudomonas aeruginosa, admitted 07/09/2018 and started on cefepime  Crystal Carlson appears stable.  She had a fever last night.  She continues to report somnolence.  The somnolence may be related to the  Pseudomonas bacteremia or polypharmacy.  I will decrease the MS Contin to once daily.  I discussed the case with Dr. Therisa Doyne.  I reviewed the MRI images with interventional radiology.  There appears to be obstruction of the left intrahepatic bile duct.  Interventional radiology will attempt placement of a transhepatic drain.  I discussed the plan with Crystal Carlson and she agrees to proceed.  We will consider resuming gemcitabine/Abraxane chemotherapy if the biliary obstruction is relieved and the infection is controlled.   Recommendations: 1.  Continue cefepime 2.  Interventional radiology consult to consider placement of a left hepatic biliary drain 3.  Continue narcotic analgesics for pain-decrease the MS Contin to once daily 4.  Keep Port-A-Cath in place for  now        LOS: 4 days   Betsy Coder, MD   07/13/2018, 11:06 AM

## 2018-07-13 NOTE — Progress Notes (Signed)
Pharmacy Antibiotic Note  Crystal Carlson is a 52 y.o. female with metastatic pancreatic cancer on chemotherapy admitted on 07/09/2018 with Pseudomonal bacteremia.  Pharmacy has been consulted for cefepime dosing.  Plan: Continue Cefepime to 2g IV q8h with improved renal function. Monitor renal function, cultures, clinical course.    Height: 5\' 5"  (165.1 cm) Weight: 123 lb 3.8 oz (55.9 kg) IBW/kg (Calculated) : 57  Temp (24hrs), Avg:99.2 F (37.3 C), Min:98.2 F (36.8 C), Max:100.7 F (38.2 C)  Recent Labs  Lab 07/08/18 1112 07/09/18 1717 07/10/18 0600 07/11/18 0501 07/13/18 0541  WBC 25.9* 25.6* 20.8* 18.1* 27.6*  CREATININE 1.25* 0.87 0.71 0.65  --     Estimated Creatinine Clearance: 74.2 mL/min (by C-G formula based on SCr of 0.65 mg/dL).    Allergies  Allergen Reactions  . Pork-Derived Products Other (See Comments)    RELIGIOUS BELIEF -  NO PORK    Antimicrobials this admission: 11/22 cefepime >>  Microbiology results: Outpatient at [facility]:  11/21 BCx from port-a-cath: GNR in aerobic and anaerobic bottles, BCID = Pseudomonas aeruginosa  This admission 11/22 BCx (port-a-cath): Pseudomonas 11/22 BCx (peripheral): Pseudomonas 11/24 BCx: ngtd  Thank you for allowing pharmacy to be a part of this patient's care.

## 2018-07-13 NOTE — Progress Notes (Signed)
[facility]  PROGRESS NOTE  Gorgeous Newlun VOJ:[id number] DOB: [birth year] DOA: 07/09/2018 PCP: Mack Hook, MD   Brief HPI:   52 year old female with a history of stage IV metastatic pancreatic cancer on chemotherapy, chronic anemia who was sent to the ED after blood cultures done as outpatient was positive for Pseudomonas.  She was admitted here 5 weeks ago with fever and chills for worsening metastatic disease and was found to have possible liver abscess and was discharged oral antibiotics.  ID and oncology has been following patient for Pseudomonas bacteremia.    Subjective   Patient seen and examined, MRCP showed progressive intrahepatic biliary dilatation to lateral segment of left lobe of liver.  Cannot confirm patency of CBD stent.  This morning IR was consulted for percutaneous cholangiogram and biliary drain placement.   Assessment/Plan:     1. Pseudomonas bacteremia-outpatient blood cultures grew Pseudomonas.  Patient started on cefepime.  Repeat blood cultures from 07/11/2018 are negative.  Patient has Port-A-Cath in place, continue with cefepime.  2. Leukocytosis-WBC worsening, 27,000.  Continue cefepime as above.  3. Stage IV pancreatic cancer-followed by oncology as outpatient.  She was on chemotherapy.  Recently had biliary stent exchange by GI for metastatic pancreatic cancer on 07/05/2018.  ID consulted GI, [facility] recommended MRCP to check for stent position.  MRCP shows infected metastasis or hepatic abscesses.  IR consulted for biliary drain/possible paracentesis.  GI has signed off.   4. Elevated INR- INR was 1.7 this morning, 1 unit of FFP given for IR procedure as I have needed to be less than 1.5.  5. Severe protein calorie malnutrition-nutrition consulted  6. Hyponatremia-improved with IV fluids.  Today sodium 131.  7. Diabetes mellitus type 2-continue sliding scale insulin with NovoLog, CBG stable.      CBG: Recent Labs  Lab 07/12/18 1645  07/12/18 2141 07/13/18 0726 07/13/18 1131 07/13/18 1637  GLUCAP 142* 133* 115* 157* 99    CBC: Recent Labs  Lab 07/08/18 1112 07/09/18 1717 07/10/18 0600 07/11/18 0501 07/13/18 0541  WBC 25.9* 25.6* 20.8* 18.1* 27.6*  NEUTROABS 23.8* 23.3*  --  14.9*  --   HGB 9.9* 9.5* 8.0* 8.6* 8.3*  HCT 30.5* 30.8* 25.6* 28.4* 27.1*  MCV 80.1 82.4 83.4 85.0 84.7  PLT 182 228 176 201 993    Basic Metabolic Panel: Recent Labs  Lab 07/08/18 1112 07/09/18 1717 07/10/18 0600 07/11/18 0501  NA 126* 123* 126* 131*  K 5.3* 5.0 4.5 4.2  CL 89* 88* 94* 102  CO2 27 25 25  21*  GLUCOSE 234* 204* 139* 90  BUN 39* 37* 31* 23*  CREATININE 1.25* 0.87 0.71 0.65  CALCIUM 9.3 8.4* 8.1* 8.3*  MG  --  2.4  --   --      DVT prophylaxis: Heparin  Code Status: Full code  Family Communication: Discussed with family member at bedside  Disposition Plan: likely home when medically ready for discharge   Consultants:  ID  Oncology  Procedures: None   Antibiotics:   Anti-infectives (From admission, onward)   Start     Dose/Rate Route Frequency Ordered Stop   07/10/18 1400  ceFEPIme (MAXIPIME) 2 g in sodium chloride 0.9 % 100 mL IVPB     2 g 200 mL/hr over 30 Minutes Intravenous Every 8 hours 07/10/18 1344     07/09/18 1730  ceFEPIme (MAXIPIME) 2 g in sodium chloride 0.9 % 100 mL IVPB  Status:  Discontinued     2 g 200 mL/hr  over 30 Minutes Intravenous Every 12 hours 07/09/18 1649 07/10/18 1344       Objective   Vitals:   07/13/18 0521 07/13/18 1145 07/13/18 1200 07/13/18 1412  BP: 106/77 112/77 111/77 (!) 123/99  Pulse: 94 86 94 91  Resp: 17 18 18 18   Temp: 98.2 F (36.8 C) 98.3 F (36.8 C) 98.3 F (36.8 C) 97.6 F (36.4 C)  TempSrc: Oral Oral Oral Oral  SpO2: 100% 100% 99% 100%  Weight: 55.9 kg     Height:        Intake/Output Summary (Last 24 hours) at 07/13/2018 1710 Last data filed at 07/13/2018 1400 Gross per 24 hour  Intake 1040 ml  Output -  Net 1040 ml    Filed Weights   07/10/18 0434 07/10/18 0500 07/13/18 0521  Weight: 49.3 kg 48.5 kg 55.9 kg     Physical Examination:     General: Appears in no acute distress  Cardiovascular: S1-S2, regular  Respiratory: Clear to auscultation bilaterally, no wheezing or crackles  Abdomen: Soft, non tender, no organomegaly, distended  Musculoskeletal: No edema of the lower extremities      Data Reviewed: I have personally reviewed following labs and imaging studies   Recent Results (from the past 240 hour(s))  Culture, Blood     Status: Abnormal   Collection Time: 07/08/18 11:12 AM  Result Value Ref Range Status   Specimen Description BLOOD PORTA CATH RIGHT  Final   Special Requests   Final    BOTTLES DRAWN AEROBIC AND ANAEROBIC Blood Culture adequate volume   Culture  Setup Time   Final    GRAM NEGATIVE RODS IN BOTH AEROBIC AND ANAEROBIC BOTTLES CRITICAL RESULT CALLED TO, READ BACK BY AND VERIFIED WITH: S CARDWELL RN [id number] BY GF Performed at [facility], [place] [address], [place], [place] [address]    Culture PSEUDOMONAS AERUGINOSA (A)  Final   Report Status 07/11/2018 FINAL  Final   Organism ID, Bacteria PSEUDOMONAS AERUGINOSA  Final      Susceptibility   Pseudomonas aeruginosa - MIC*    CEFTAZIDIME <=1 SENSITIVE Sensitive     CIPROFLOXACIN >=4 RESISTANT Resistant     GENTAMICIN <=1 SENSITIVE Sensitive     IMIPENEM 1 SENSITIVE Sensitive     PIP/TAZO <=4 SENSITIVE Sensitive     CEFEPIME <=1 SENSITIVE Sensitive     * PSEUDOMONAS AERUGINOSA  Blood Culture ID Panel (Reflexed)     Status: Abnormal   Collection Time: 07/08/18 11:12 AM  Result Value Ref Range Status   Enterococcus species NOT DETECTED NOT DETECTED Final   Listeria monocytogenes NOT DETECTED NOT DETECTED Final   Staphylococcus species NOT DETECTED NOT DETECTED Final   Staphylococcus aureus (BCID) NOT DETECTED NOT DETECTED Final   Streptococcus species NOT DETECTED NOT DETECTED Final    Streptococcus agalactiae NOT DETECTED NOT DETECTED Final   Streptococcus pneumoniae NOT DETECTED NOT DETECTED Final   Streptococcus pyogenes NOT DETECTED NOT DETECTED Final   Acinetobacter baumannii NOT DETECTED NOT DETECTED Final   Enterobacteriaceae species NOT DETECTED NOT DETECTED Final   Enterobacter cloacae complex NOT DETECTED NOT DETECTED Final   Escherichia coli NOT DETECTED NOT DETECTED Final   Klebsiella oxytoca NOT DETECTED NOT DETECTED Final   Klebsiella pneumoniae NOT DETECTED NOT DETECTED Final   Proteus species NOT DETECTED NOT DETECTED Final   Serratia marcescens NOT DETECTED NOT DETECTED Final   Carbapenem resistance NOT DETECTED NOT DETECTED Final   Haemophilus influenzae NOT DETECTED NOT DETECTED  Final   Neisseria meningitidis NOT DETECTED NOT DETECTED Final   Pseudomonas aeruginosa DETECTED (A) NOT DETECTED Final    Comment: CRITICAL RESULT CALLED TO, READ BACK BY AND VERIFIED WITH: S CAEDWELL RN [id number] BY GF    Candida albicans NOT DETECTED NOT DETECTED Final   Candida glabrata NOT DETECTED NOT DETECTED Final   Candida krusei NOT DETECTED NOT DETECTED Final   Candida parapsilosis NOT DETECTED NOT DETECTED Final   Candida tropicalis NOT DETECTED NOT DETECTED Final    Comment: Performed at [facility], [place] [address] [place], [place] [address]  Culture, blood (Routine X 2) w Reflex to ID Panel     Status: Abnormal   Collection Time: 07/09/18  5:18 PM  Result Value Ref Range Status   Specimen Description   Final    BLOOD BLOOD LEFT ARM Performed at [facility] [address] [place], [place] [address]    Special Requests   Final    BOTTLES DRAWN AEROBIC AND ANAEROBIC Blood Culture adequate volume Performed at [facility] [address], [place], [place] [address]    Culture  Setup Time   Final    GRAM NEGATIVE RODS AEROBIC BOTTLE ONLY CRITICAL VALUE NOTED.  VALUE IS CONSISTENT WITH PREVIOUSLY REPORTED AND  CALLED VALUE.    Culture (A)  Final    PSEUDOMONAS AERUGINOSA SUSCEPTIBILITIES PERFORMED ON PREVIOUS CULTURE WITHIN THE LAST 5 DAYS. Performed at [facility], [place] [address], [place], [place] [address]    Report Status 07/12/2018 FINAL  Final  Culture, blood (Routine X 2) w Reflex to ID Panel     Status: Abnormal   Collection Time: 07/09/18  6:19 PM  Result Value Ref Range Status   Specimen Description   Final    BLOOD PORTA CATH Performed at [facility] [address] [place], [place] [address]    Special Requests   Final    BOTTLES DRAWN AEROBIC AND ANAEROBIC Blood Culture adequate volume Performed at [facility] [address] [place], [place] [address]    Culture  Setup Time   Final    GRAM NEGATIVE RODS IN BOTH AEROBIC AND ANAEROBIC BOTTLES CRITICAL VALUE NOTED.  VALUE IS CONSISTENT WITH PREVIOUSLY REPORTED AND CALLED VALUE.    Culture (A)  Final    PSEUDOMONAS AERUGINOSA SUSCEPTIBILITIES PERFORMED ON PREVIOUS CULTURE WITHIN THE LAST 5 DAYS. Performed at [facility], [place] [address], [place], [place] [address]    Report Status 07/12/2018 FINAL  Final  Culture, blood (Routine X 2) w Reflex to ID Panel     Status: None (Preliminary result)   Collection Time: 07/11/18  8:00 AM  Result Value Ref Range Status   Specimen Description   Final    BLOOD RIGHT ANTECUBITAL Performed at [facility] [address], [place], [place] [address]    Special Requests   Final    BOTTLES DRAWN AEROBIC ONLY Blood Culture adequate volume Performed at [facility] [address] [place], [place] [address]    Culture   Final    NO GROWTH 2 DAYS Performed at [facility] [address], [place], [place] [address]    Report Status PENDING  Incomplete     Liver Function Tests: Recent Labs  Lab 07/08/18 1112 07/09/18 1717 07/10/18 0600 07/11/18 0501  AST 52* 38 29 39  ALT 103* 73*  56* 50*  ALKPHOS  332* 258* 212* 237*  BILITOT 9.0* 9.0* 7.2* 7.5*  PROT 7.3 6.9 5.8* 5.8*  ALBUMIN 2.1* 2.0* 1.8* 1.7*   No results for input(s): LIPASE, AMYLASE in the last 168 hours. Recent Labs  Lab 07/09/18 1722  AMMONIA 55*    Cardiac Enzymes: No results for input(s): CKTOTAL, CKMB, CKMBINDEX, TROPONINI in the last 168 hours. BNP (last 3 results) No results for input(s): BNP in the last 8760 hours.  ProBNP (last 3 results) No results for input(s): PROBNP in the last 8760 hours.    Studies: Mr 3d Recon At Scanner  Result Date: 07/12/2018 CLINICAL DATA:  Pancreatic cancer. EXAM: MRI ABDOMEN WITHOUT AND WITH CONTRAST (INCLUDING MRCP) TECHNIQUE: Multiplanar multisequence MR imaging of the abdomen was performed both before and after the administration of intravenous contrast. Heavily T2-weighted images of the biliary and pancreatic ducts were obtained, and three-dimensional MRCP images were rendered by post processing. CONTRAST:  4 cc Gadavist. COMPARISON:  MRI 06/19/2018 FINDINGS: Lower chest: No acute findings. Hepatobiliary: Innumerable peripherally enhancing cystic lesions are noted involving predominantly the medial segment of left lobe of liver and right lobe of liver. Dominant lesion within segment 8 measures 3.6 cm, image 16/3. Previously 2.9. Lesion within segment 6 measures 3.0 cm, image 30/6. Previously 3.0 cm. There appears to be a small amount of gas within this lesion. Also within segment 6 is a 2.3 cm lesion, image 34/3. New from previous exam. Lesion within segment 5 measures 2.7 cm, image 31/3. Previously 1.8 cm. Anterior subcapsular lesion within segment 8 measures 1.5 cm, image 19/3. Previously 1.6 cm. There is marked dilatation of the intrahepatic bile ducts to the left lobe of liver, image 48/12. This has increased and caliber from the previous exam. There is mild dilatation of the intrahepatic bile ducts to the medial segment and right lobe of liver. There is a 2.3 cm  area of high-grade stricturing involving the proximal left hepatic duct, image number 46/12. Findings are suspicious for malignant stricture. Common bile duct stent is in place. Filling defects within the common bile duct stent are identified. Cannot confirm stent patency. Pancreas: Large infiltrative mass centered around the head of pancreas is again noted. This is similar to 06/16/2018 measuring 3.6 x 6.6 cm with involvement of the celiac and superior mesenteric artery. 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 The mass extends into the retroperitoneum to encase the IVC and abdominal aorta, similar to previous study. Spleen: The spleen is enlarged measuring 16 cm in cranial caudal dimension. Adrenals/Urinary Tract: The adrenal glands are unremarkable. No kidney mass or hydronephrosis identified. Stomach/Bowel: Moderate distension of the stomach. There is mild increase caliber of the small bowel loops. No pathologic dilatation of the colon. Vascular/Lymphatic: Abdominal aorta and IVC are normal in caliber. There is soft tissue encasement of both the IVC and abdominal aorta by pancreatic head mass. 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 No focal adenopathy. Other: Large volume of ascites is identified. Significantly increased from 06/16/2018. Musculoskeletal: No suspicious bone lesions identified. IMPRESSION: 1. Large pancreatic head mass is again identified with extensive vascular involvement and extension into the retroperitoneum with involvement of the aorta and IVC. 2. Multifocal liver lesions are again noted. Some of these appears stable while others are increased in size in the interval. As mentioned previously some of these liver lesions may reflect either infected metastasis or hepatic abscesses. 3. Progressive intrahepatic biliary dilatation to the lateral segment  of left lobe of liver. Persistent  mild biliary dilatation to the right lobe of liver. Cannot confirm patency of common bile duct stent. 4. Increase in volume of ascites. Electronically Signed   By: Kerby Moors M.D.   On: 07/12/2018 15:42   Mr Abdomen Mrcp Moise Boring Contast  Result Date: 07/12/2018 CLINICAL DATA:  Pancreatic cancer. EXAM: MRI ABDOMEN WITHOUT AND WITH CONTRAST (INCLUDING MRCP) TECHNIQUE: Multiplanar multisequence MR imaging of the abdomen was performed both before and after the administration of intravenous contrast. Heavily T2-weighted images of the biliary and pancreatic ducts were obtained, and three-dimensional MRCP images were rendered by post processing. CONTRAST:  4 cc Gadavist. COMPARISON:  MRI 06/19/2018 FINDINGS: Lower chest: No acute findings. Hepatobiliary: Innumerable peripherally enhancing cystic lesions are noted involving predominantly the medial segment of left lobe of liver and right lobe of liver. Dominant lesion within segment 8 measures 3.6 cm, image 16/3. Previously 2.9. Lesion within segment 6 measures 3.0 cm, image 30/6. Previously 3.0 cm. There appears to be a small amount of gas within this lesion. Also within segment 6 is a 2.3 cm lesion, image 34/3. New from previous exam. Lesion within segment 5 measures 2.7 cm, image 31/3. Previously 1.8 cm. Anterior subcapsular lesion within segment 8 measures 1.5 cm, image 19/3. Previously 1.6 cm. There is marked dilatation of the intrahepatic bile ducts to the left lobe of liver, image 48/12. This has increased and caliber from the previous exam. There is mild dilatation of the intrahepatic bile ducts to the medial segment and right lobe of liver. There is a 2.3 cm area of high-grade stricturing involving the proximal left hepatic duct, image number 46/12. Findings are suspicious for malignant stricture. Common bile duct stent is in place. Filling defects within the common bile duct stent are identified. Cannot confirm stent patency. Pancreas: Large infiltrative  mass centered around the head of pancreas is again noted. This is similar to 06/16/2018 measuring 3.6 x 6.6 cm with involvement of the celiac and superior mesenteric artery. 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 The mass extends into the retroperitoneum to encase the IVC and abdominal aorta, similar to previous study. Spleen: The spleen is enlarged measuring 16 cm in cranial caudal dimension. Adrenals/Urinary Tract: The adrenal glands are unremarkable. No kidney mass or hydronephrosis identified. Stomach/Bowel: Moderate distension of the stomach. There is mild increase caliber of the small bowel loops. No pathologic dilatation of the colon. Vascular/Lymphatic: Abdominal aorta and IVC are normal in caliber. There is soft tissue encasement of both the IVC and abdominal aorta by pancreatic head mass. 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 No focal adenopathy. Other: Large volume of ascites is identified. Significantly increased from 06/16/2018. Musculoskeletal: No suspicious bone lesions identified. IMPRESSION: 1. Large pancreatic head mass is again identified with extensive vascular involvement and extension into the retroperitoneum with involvement of the aorta and IVC. 2. Multifocal liver lesions are again noted. Some of these appears stable while others are increased in size in the interval. As mentioned previously some of these liver lesions may reflect either infected metastasis or hepatic abscesses. 3. Progressive intrahepatic biliary dilatation to the lateral segment of left lobe of liver. Persistent mild biliary dilatation to the right lobe of liver. Cannot confirm patency of common bile duct stent. 4. Increase in volume of ascites. Electronically Signed   By: Kerby Moors M.D.   On: 07/12/2018 15:42   Ir Abdomen US  Limited  Result Date: 07/13/2018 INDICATION: Biliary obstruction.  Ascites.  EXAM: ULTRASOUND GUIDED PARACENTESIS, LIMITED ABDOMINAL ULTRASOUND, ABORTED PERCUTANEOUS TRANSHEPATIC CHOLANGIOGRAM AND BILIARY DRAIN MEDICATIONS: None. COMPLICATIONS: None immediate. PROCEDURE: Informed written consent was obtained from the patient after a discussion of the risks, benefits and alternatives to treatment. A timeout was performed prior to the initiation of the procedure. Imaging of the upper abdomen at the location of the left liver demonstrates no significant dilated left hepatic ducts. The left lobe of the liver is very atrophic. External biliary drain procedure was aborted. Initial ultrasound scanning demonstrates a large amount of ascites within the right lower abdominal quadrant. The right lower abdomen was prepped and draped in the usual sterile fashion. 1% lidocaine with epinephrine was used for local anesthesia. Following this, a 6 Fr Safe-T-Centesis catheter was introduced. An ultrasound image was saved for documentation purposes. The paracentesis was performed. The catheter was removed and a dressing was applied. The patient tolerated the procedure well without immediate post procedural complication. FINDINGS: Imaging of the left lobe of the liver demonstrates no significant dilated left hepatic ducts. A total of approximately 2.1 L of clear yellow fluid was removed. IMPRESSION: The biliary drain procedure into the left lobe of the liver was aborted. There were no suitable targets for entry as there were no significantly dilated bile ducts. Imaging over the right lobe of the liver demonstrates decompressed docs and multiple cystic areas. Successful ultrasound-guided paracentesis yielding 2.1 liters of peritoneal fluid. Electronically Signed   By: Marybelle Killings M.D.   On: 07/13/2018 15:33   Ir Paracentesis  Result Date: 07/13/2018 INDICATION: Biliary obstruction.  Ascites. EXAM: ULTRASOUND GUIDED PARACENTESIS, LIMITED ABDOMINAL ULTRASOUND, ABORTED PERCUTANEOUS TRANSHEPATIC CHOLANGIOGRAM  AND BILIARY DRAIN MEDICATIONS: None. COMPLICATIONS: None immediate. PROCEDURE: Informed written consent was obtained from the patient after a discussion of the risks, benefits and alternatives to treatment. A timeout was performed prior to the initiation of the procedure. Imaging of the upper abdomen at the location of the left liver demonstrates no significant dilated left hepatic ducts. The left lobe of the liver is very atrophic. External biliary drain procedure was aborted. Initial ultrasound scanning demonstrates a large amount of ascites within the right lower abdominal quadrant. The right lower abdomen was prepped and draped in the usual sterile fashion. 1% lidocaine with epinephrine was used for local anesthesia. Following this, a 6 Fr Safe-T-Centesis catheter was introduced. An ultrasound image was saved for documentation purposes. The paracentesis was performed. The catheter was removed and a dressing was applied. The patient tolerated the procedure well without immediate post procedural complication. FINDINGS: Imaging of the left lobe of the liver demonstrates no significant dilated left hepatic ducts. A total of approximately 2.1 L of clear yellow fluid was removed. IMPRESSION: The biliary drain procedure into the left lobe of the liver was aborted. There were no suitable targets for entry as there were no significantly dilated bile ducts. Imaging over the right lobe of the liver demonstrates decompressed docs and multiple cystic areas. Successful ultrasound-guided paracentesis yielding 2.1 liters of peritoneal fluid. Electronically Signed   By: Marybelle Killings M.D.   On: 07/13/2018 15:33    Scheduled Meds: . feeding supplement (GLUCERNA SHAKE)  237 mL Oral TID BM  . ferrous sulfate  325 mg Oral BID WC  . insulin aspart  0-5 Units Subcutaneous QHS  . insulin aspart  0-9 Units Subcutaneous TID WC  . iopamidol      . lidocaine      .  morphine  15 mg Oral Q2000  . multivitamin with minerals  1  tablet Oral Daily  . predniSONE  5 mg Oral Q breakfast    Admission status: Inpatient: Based on patients clinical presentation and evaluation of above clinical data, I have made determination that patient meets Inpatient criteria at this time.  Time spent: 20 min  [facility] Pager [phone]. If 7PM-7AM, please contact night-coverage at [email or web address], Office  [phone]  password [email or web address]  07/13/2018, 5:10 PM  LOS: 4 days

## 2018-07-13 NOTE — Progress Notes (Signed)
Subjective: Patient was seen and examined at bedside. Tmax was 100.7 She denies abdominal pain, nausea or vomiting. Complains of worsening abdominal distention.  Objective: Vital signs in last 24 hours: Temp:  [98.2 F (36.8 C)-100.7 F (38.2 C)] 98.2 F (36.8 C) (11/26 0521) Pulse Rate:  [94-130] 94 (11/26 0521) Resp:  [14-18] 17 (11/26 0521) BP: (106-127)/(77-98) 106/77 (11/26 0521) SpO2:  [95 %-100 %] 100 % (11/26 0521) Weight:  [55.9 kg] 55.9 kg (11/26 0521) Weight change:  Last BM Date: 07/11/18  PE:,thinly  built, cachectic, ill-appearing GENERAL:icteric, pale ABDOMEN:soft but distended with shifting dullness and fluid thrill, normoactive bowel sounds EXTREMITIES:no deformity, no edema  Lab Results: Results for orders placed or performed during the hospital encounter of 07/09/18 (from the past 48 hour(s))  Glucose, capillary     Status: Abnormal   Collection Time: 07/11/18 11:49 AM  Result Value Ref Range   Glucose-Capillary 146 (H) 70 - 99 mg/dL  Glucose, capillary     Status: Abnormal   Collection Time: 07/11/18  4:51 PM  Result Value Ref Range   Glucose-Capillary 229 (H) 70 - 99 mg/dL  Glucose, capillary     Status: Abnormal   Collection Time: 07/11/18  8:33 PM  Result Value Ref Range   Glucose-Capillary 128 (H) 70 - 99 mg/dL   Comment 1 Notify RN    Comment 2 Document in Chart   Glucose, capillary     Status: Abnormal   Collection Time: 07/12/18  7:40 AM  Result Value Ref Range   Glucose-Capillary 102 (H) 70 - 99 mg/dL   Comment 1 Notify RN    Comment 2 Document in Chart   Glucose, capillary     Status: Abnormal   Collection Time: 07/12/18 12:21 PM  Result Value Ref Range   Glucose-Capillary 175 (H) 70 - 99 mg/dL   Comment 1 Notify RN    Comment 2 Document in Chart   Glucose, capillary     Status: Abnormal   Collection Time: 07/12/18  4:45 PM  Result Value Ref Range   Glucose-Capillary 142 (H) 70 - 99 mg/dL   Comment 1 Notify RN    Comment 2  Document in Chart   Glucose, capillary     Status: Abnormal   Collection Time: 07/12/18  9:41 PM  Result Value Ref Range   Glucose-Capillary 133 (H) 70 - 99 mg/dL   Comment 1 Notify RN   CBC     Status: Abnormal   Collection Time: 07/13/18  5:41 AM  Result Value Ref Range   WBC 27.6 (H) 4.0 - 10.5 K/uL   RBC 3.20 (L) 3.87 - 5.11 MIL/uL   Hemoglobin 8.3 (L) 12.0 - 15.0 g/dL   HCT 27.1 (L) 36.0 - 46.0 %   MCV 84.7 80.0 - 100.0 fL   MCH 25.9 (L) 26.0 - 34.0 pg   MCHC 30.6 30.0 - 36.0 g/dL   RDW 25.6 (H) 11.5 - 15.5 %   Platelets 189 150 - 400 K/uL   nRBC 0.0 0.0 - 0.2 %    Comment: Performed at [facility], [place] [address] [place], [place] [address]  Glucose, capillary     Status: Abnormal   Collection Time: 07/13/18  7:26 AM  Result Value Ref Range   Glucose-Capillary 115 (H) 70 - 99 mg/dL  Protime-INR     Status: Abnormal   Collection Time: 07/13/18  8:45 AM  Result Value Ref Range   Prothrombin Time 19.9 (H) 11.4 - 15.2  seconds   INR 1.71     Comment: Performed at [facility], [place] [address], [place], [place] [address]    Studies/Results: Mr 3d Recon At Scanner  Result Date: 07/12/2018 CLINICAL DATA:  Pancreatic cancer. EXAM: MRI ABDOMEN WITHOUT AND WITH CONTRAST (INCLUDING MRCP) TECHNIQUE: Multiplanar multisequence MR imaging of the abdomen was performed both before and after the administration of intravenous contrast. Heavily T2-weighted images of the biliary and pancreatic ducts were obtained, and three-dimensional MRCP images were rendered by post processing. CONTRAST:  4 cc Gadavist. COMPARISON:  MRI 06/19/2018 FINDINGS: Lower chest: No acute findings. Hepatobiliary: Innumerable peripherally enhancing cystic lesions are noted involving predominantly the medial segment of left lobe of liver and right lobe of liver. Dominant lesion within segment 8 measures 3.6 cm, image 16/3. Previously 2.9. Lesion within segment 6 measures 3.0 cm, image  30/6. Previously 3.0 cm. There appears to be a small amount of gas within this lesion. Also within segment 6 is a 2.3 cm lesion, image 34/3. New from previous exam. Lesion within segment 5 measures 2.7 cm, image 31/3. Previously 1.8 cm. Anterior subcapsular lesion within segment 8 measures 1.5 cm, image 19/3. Previously 1.6 cm. There is marked dilatation of the intrahepatic bile ducts to the left lobe of liver, image 48/12. This has increased and caliber from the previous exam. There is mild dilatation of the intrahepatic bile ducts to the medial segment and right lobe of liver. There is a 2.3 cm area of high-grade stricturing involving the proximal left hepatic duct, image number 46/12. Findings are suspicious for malignant stricture. Common bile duct stent is in place. Filling defects within the common bile duct stent are identified. Cannot confirm stent patency. Pancreas: Large infiltrative mass centered around the head of pancreas is again noted. This is similar to 06/16/2018 measuring 3.6 x 6.6 cm with involvement of the celiac and superior mesenteric artery. 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 The mass extends into the retroperitoneum to encase the IVC and abdominal aorta, similar to previous study. Spleen: The spleen is enlarged measuring 16 cm in cranial caudal dimension. Adrenals/Urinary Tract: The adrenal glands are unremarkable. No kidney mass or hydronephrosis identified. Stomach/Bowel: Moderate distension of the stomach. There is mild increase caliber of the small bowel loops. No pathologic dilatation of the colon. Vascular/Lymphatic: Abdominal aorta and IVC are normal in caliber. There is soft tissue encasement of both the IVC and abdominal aorta by pancreatic head mass. 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 No focal adenopathy. Other: Large volume of ascites is identified. Significantly  increased from 06/16/2018. Musculoskeletal: No suspicious bone lesions identified. IMPRESSION: 1. Large pancreatic head mass is again identified with extensive vascular involvement and extension into the retroperitoneum with involvement of the aorta and IVC. 2. Multifocal liver lesions are again noted. Some of these appears stable while others are increased in size in the interval. As mentioned previously some of these liver lesions may reflect either infected metastasis or hepatic abscesses. 3. Progressive intrahepatic biliary dilatation to the lateral segment of left lobe of liver. Persistent mild biliary dilatation to the right lobe of liver. Cannot confirm patency of common bile duct stent. 4. Increase in volume of ascites. Electronically Signed   By: Kerby Moors M.D.   On: 07/12/2018 15:42   Mr Abdomen Mrcp Moise Boring Contast  Result Date: 07/12/2018 CLINICAL DATA:  Pancreatic cancer. EXAM: MRI ABDOMEN WITHOUT AND WITH CONTRAST (  INCLUDING MRCP) TECHNIQUE: Multiplanar multisequence MR imaging of the abdomen was performed both before and after the administration of intravenous contrast. Heavily T2-weighted images of the biliary and pancreatic ducts were obtained, and three-dimensional MRCP images were rendered by post processing. CONTRAST:  4 cc Gadavist. COMPARISON:  MRI 06/19/2018 FINDINGS: Lower chest: No acute findings. Hepatobiliary: Innumerable peripherally enhancing cystic lesions are noted involving predominantly the medial segment of left lobe of liver and right lobe of liver. Dominant lesion within segment 8 measures 3.6 cm, image 16/3. Previously 2.9. Lesion within segment 6 measures 3.0 cm, image 30/6. Previously 3.0 cm. There appears to be a small amount of gas within this lesion. Also within segment 6 is a 2.3 cm lesion, image 34/3. New from previous exam. Lesion within segment 5 measures 2.7 cm, image 31/3. Previously 1.8 cm. Anterior subcapsular lesion within segment 8 measures 1.5 cm, image  19/3. Previously 1.6 cm. There is marked dilatation of the intrahepatic bile ducts to the left lobe of liver, image 48/12. This has increased and caliber from the previous exam. There is mild dilatation of the intrahepatic bile ducts to the medial segment and right lobe of liver. There is a 2.3 cm area of high-grade stricturing involving the proximal left hepatic duct, image number 46/12. Findings are suspicious for malignant stricture. Common bile duct stent is in place. Filling defects within the common bile duct stent are identified. Cannot confirm stent patency. Pancreas: Large infiltrative mass centered around the head of pancreas is again noted. This is similar to 06/16/2018 measuring 3.6 x 6.6 cm with involvement of the celiac and superior mesenteric artery. 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 The mass extends into the retroperitoneum to encase the IVC and abdominal aorta, similar to previous study. Spleen: The spleen is enlarged measuring 16 cm in cranial caudal dimension. Adrenals/Urinary Tract: The adrenal glands are unremarkable. No kidney mass or hydronephrosis identified. Stomach/Bowel: Moderate distension of the stomach. There is mild increase caliber of the small bowel loops. No pathologic dilatation of the colon. Vascular/Lymphatic: Abdominal aorta and IVC are normal in caliber. There is soft tissue encasement of both the IVC and abdominal aorta by pancreatic head mass. 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 No focal adenopathy. Other: Large volume of ascites is identified. Significantly increased from 06/16/2018. Musculoskeletal: No suspicious bone lesions identified. IMPRESSION: 1. Large pancreatic head mass is again identified with extensive vascular involvement and extension into the retroperitoneum with involvement of the aorta and IVC. 2. Multifocal liver lesions are again noted.  Some of these appears stable while others are increased in size in the interval. As mentioned previously some of these liver lesions may reflect either infected metastasis or hepatic abscesses. 3. Progressive intrahepatic biliary dilatation to the lateral segment of left lobe of liver. Persistent mild biliary dilatation to the right lobe of liver. Cannot confirm patency of common bile duct stent. 4. Increase in volume of ascites. Electronically Signed   By: Kerby Moors M.D.   On: 07/12/2018 15:42    Medications: I have reviewed the patient's current medications.  Assessment: Worsening leukocytosis and bacteremia suspicious for biliary sepsis/bacterial peritonitis Stage IV pancreatic cancer with biliary obstruction-covered metal stent placed on 06/09/17 and uncovered metal stent placed on 07/05/18 Pancreatic head mass with extensive vascular involvement, extension into retroperitoneum, involvement of aorta and IVC with multiple liver lesions and progressive intrahepatic biliary dilatation with worsening ascites  Plan: Discussed with  Dr. Benay Spice from oncology yesterday, doubt ERCP and further stenting endoscopically will be of much benefit. 2.3 cm area of high-grade stricturing involving the proximal left hepatic duct noted on MRI/MRCP from yesterday with multifocal liver lesions which could be infected metastases or hepatic abscesses. Agree with IR involvement for PTC with biliary drain/possible paracentesis. Guarded prognosis. GI will sign off. Further management as per primary team, oncology, infectious disease and IR.   Ronnette Juniper 07/13/2018, 9:26 AM   Pager [phone] If no answer or after 5 PM call [phone]

## 2018-07-13 NOTE — Progress Notes (Signed)
Subjective: Fatigued appearing  Antibiotics:  Anti-infectives (From admission, onward)   Start     Dose/Rate Route Frequency Ordered Stop   07/10/18 1400  ceFEPIme (MAXIPIME) 2 g in sodium chloride 0.9 % 100 mL IVPB     2 g 200 mL/hr over 30 Minutes Intravenous Every 8 hours 07/10/18 1344     07/09/18 1730  ceFEPIme (MAXIPIME) 2 g in sodium chloride 0.9 % 100 mL IVPB  Status:  Discontinued     2 g 200 mL/hr over 30 Minutes Intravenous Every 12 hours 07/09/18 1649 07/10/18 1344      Medications: Scheduled Meds: . feeding supplement (GLUCERNA SHAKE)  237 mL Oral TID BM  . ferrous sulfate  325 mg Oral BID WC  . insulin aspart  0-5 Units Subcutaneous QHS  . insulin aspart  0-9 Units Subcutaneous TID WC  . iopamidol      . lidocaine      . morphine  15 mg Oral Q2000  . multivitamin with minerals  1 tablet Oral Daily  . predniSONE  5 mg Oral Q breakfast   Continuous Infusions: . ceFEPime (MAXIPIME) IV 2 g (07/13/18 1447)   PRN Meds:.acetaminophen **OR** acetaminophen, bisacodyl, HYDROcodone-acetaminophen, HYDROmorphone, LORazepam, magnesium citrate, ondansetron **OR** ondansetron (ZOFRAN) IV, polyethylene glycol    Objective: Weight change:   Intake/Output Summary (Last 24 hours) at 07/13/2018 1713 Last data filed at 07/13/2018 1400 Gross per 24 hour  Intake 1040 ml  Output -  Net 1040 ml   Blood pressure (!) 123/99, pulse 91, temperature 97.6 F (36.4 C), temperature source Oral, resp. rate 18, height 5\' 5"  (1.651 m), weight 55.9 kg, SpO2 100 %. Temp:  [97.6 F (36.4 C)-100.7 F (38.2 C)] 97.6 F (36.4 C) (11/26 1412) Pulse Rate:  [86-130] 91 (11/26 1412) Resp:  [17-18] 18 (11/26 1412) BP: (106-127)/(77-99) 123/99 (11/26 1412) SpO2:  [99 %-100 %] 100 % (11/26 1412) Weight:  [55.9 kg] 55.9 kg (11/26 0521)  Physical Exam: General: Alert and awake, oriented x3, not in any acute distress.  Some ecchymoses, facial wasting HEENT: anicteric sclera,  EOMI CVS tachycardic rate, normal  Chest: , no wheezing, no respiratory distress Abdomen: soft non-distended,  Extremities: no edema or deformity noted bilaterally Skin: Port-A-Cath not overtly infected Neuro: nonfocal  CBC:    BMET Recent Labs    07/11/18 0501  NA 131*  K 4.2  CL 102  CO2 21*  GLUCOSE 90  BUN 23*  CREATININE 0.65  CALCIUM 8.3*     Liver Panel  Recent Labs    07/11/18 0501  PROT 5.8*  ALBUMIN 1.7*  AST 39  ALT 50*  ALKPHOS 237*  BILITOT 7.5*       Sedimentation Rate No results for input(s): ESRSEDRATE in the last 72 hours. C-Reactive Protein No results for input(s): CRP in the last 72 hours.  Micro Results: Recent Results (from the past 720 hour(s))  Blood culture (routine x 2)     Status: None   Collection Time: 06/16/18  5:24 PM  Result Value Ref Range Status   Specimen Description   Final    BLOOD RIGHT ANTECUBITAL Performed at [facility] [address] [place], [place] [address]    Special Requests   Final    BOTTLES DRAWN AEROBIC AND ANAEROBIC Blood Culture adequate volume   Culture   Final    NO GROWTH 5 DAYS Performed at [facility], [place],  [place] [address]    Report Status 06/21/2018 FINAL  Final  Blood culture (routine x 2)     Status: None   Collection Time: 06/16/18  5:24 PM  Result Value Ref Range Status   Specimen Description   Final    BLOOD PORTA CATH Performed at [facility] [address] [place], [place] [address]    Special Requests   Final    BOTTLES DRAWN AEROBIC AND ANAEROBIC Blood Culture adequate volume   Culture   Final    NO GROWTH 5 DAYS Performed at [facility], [place] [address] [place], [place] [address]    Report Status 06/21/2018 FINAL  Final  Urine culture     Status: None   Collection Time: 06/16/18  5:24 PM  Result Value Ref Range Status   Specimen Description   Final    URINE, RANDOM Performed at [facility] [address], [place], [place] [address]    Special Requests   Final    NONE Performed at [facility], [place] [address] [place], [place] [address]    Culture   Final    NO GROWTH Performed at [facility], [place] [address] [place], [place] [address]    Report Status 06/18/2018 FINAL  Final  Aerobic/Anaerobic Culture (surgical/deep wound)     Status: None   Collection Time: 06/22/18  1:04 PM  Result Value Ref Range Status   Specimen Description   Final    LIVER Performed at [facility] [address] [place], [place] [address]    Special Requests   Final    NONE Performed at [facility], [place] [address], [place], [place] [address]    Gram Stain NO WBC SEEN NO ORGANISMS SEEN   Final   Culture   Final    No growth aerobically or anaerobically. Performed at [facility], [place] [address] [place],  [address]    Report Status 06/27/2018 FINAL  Final  Culture, Blood     Status: Abnormal   Collection Time: 07/08/18 11:12 AM  Result Value Ref Range Status   Specimen Description BLOOD PORTA CATH RIGHT  Final   Special Requests   Final    BOTTLES DRAWN AEROBIC AND ANAEROBIC Blood Culture adequate volume   Culture  Setup Time   Final    GRAM NEGATIVE RODS IN BOTH AEROBIC AND ANAEROBIC BOTTLES CRITICAL RESULT CALLED TO, READ BACK BY AND VERIFIED WITH: S CARDWELL RN [phone] BY GF Performed at [facility], [place] [address], [place], [place] [address]    Culture PSEUDOMONAS AERUGINOSA (A)  Final   Report Status 07/11/2018 FINAL  Final   Organism ID, Bacteria PSEUDOMONAS AERUGINOSA  Final      Susceptibility   Pseudomonas aeruginosa - MIC*    CEFTAZIDIME <=1 SENSITIVE Sensitive     CIPROFLOXACIN >=4 RESISTANT Resistant     GENTAMICIN <=1 SENSITIVE Sensitive     IMIPENEM 1 SENSITIVE Sensitive     PIP/TAZO <=4 SENSITIVE Sensitive     CEFEPIME <=1 SENSITIVE Sensitive     *  PSEUDOMONAS AERUGINOSA  Blood Culture ID Panel (Reflexed)     Status: Abnormal   Collection Time: 07/08/18 11:12 AM  Result Value Ref Range Status   Enterococcus species NOT DETECTED NOT DETECTED Final   Listeria monocytogenes NOT DETECTED NOT DETECTED Final   Staphylococcus species NOT DETECTED NOT DETECTED Final   Staphylococcus  aureus (BCID) NOT DETECTED NOT DETECTED Final   Streptococcus species NOT DETECTED NOT DETECTED Final   Streptococcus agalactiae NOT DETECTED NOT DETECTED Final   Streptococcus pneumoniae NOT DETECTED NOT DETECTED Final   Streptococcus pyogenes NOT DETECTED NOT DETECTED Final   Acinetobacter baumannii NOT DETECTED NOT DETECTED Final   Enterobacteriaceae species NOT DETECTED NOT DETECTED Final   Enterobacter cloacae complex NOT DETECTED NOT DETECTED Final   Escherichia coli NOT DETECTED NOT DETECTED Final   Klebsiella oxytoca NOT DETECTED NOT DETECTED Final   Klebsiella pneumoniae NOT DETECTED NOT DETECTED Final   Proteus species NOT DETECTED NOT DETECTED Final   Serratia marcescens NOT DETECTED NOT DETECTED Final   Carbapenem resistance NOT DETECTED NOT DETECTED Final   Haemophilus influenzae NOT DETECTED NOT DETECTED Final   Neisseria meningitidis NOT DETECTED NOT DETECTED Final   Pseudomonas aeruginosa DETECTED (A) NOT DETECTED Final    Comment: CRITICAL RESULT CALLED TO, READ BACK BY AND VERIFIED WITH: S CAEDWELL RN [phone] BY GF    Candida albicans NOT DETECTED NOT DETECTED Final   Candida glabrata NOT DETECTED NOT DETECTED Final   Candida krusei NOT DETECTED NOT DETECTED Final   Candida parapsilosis NOT DETECTED NOT DETECTED Final   Candida tropicalis NOT DETECTED NOT DETECTED Final    Comment: Performed at [facility], [place] [address] [place], [place] [address]  Culture, blood (Routine X 2) w Reflex to ID Panel     Status: Abnormal   Collection Time: 07/09/18  5:18 PM  Result Value Ref Range Status   Specimen Description   Final     BLOOD BLOOD LEFT ARM Performed at [facility] [address], [place], [place] [address]    Special Requests   Final    BOTTLES DRAWN AEROBIC AND ANAEROBIC Blood Culture adequate volume Performed at [facility] [address] [place], [place] [address]    Culture  Setup Time   Final    GRAM NEGATIVE RODS AEROBIC BOTTLE ONLY CRITICAL VALUE NOTED.  VALUE IS CONSISTENT WITH PREVIOUSLY REPORTED AND CALLED VALUE.    Culture (A)  Final    PSEUDOMONAS AERUGINOSA SUSCEPTIBILITIES PERFORMED ON PREVIOUS CULTURE WITHIN THE LAST 5 DAYS. Performed at [facility], [place] [address] [place], [place] [address]    Report Status 07/12/2018 FINAL  Final  Culture, blood (Routine X 2) w Reflex to ID Panel     Status: Abnormal   Collection Time: 07/09/18  6:19 PM  Result Value Ref Range Status   Specimen Description   Final    BLOOD PORTA CATH Performed at [facility] [address], [place], [place] [address]    Special Requests   Final    BOTTLES DRAWN AEROBIC AND ANAEROBIC Blood Culture adequate volume Performed at [facility] [address] [place], [place] [address]    Culture  Setup Time   Final    GRAM NEGATIVE RODS IN BOTH AEROBIC AND ANAEROBIC BOTTLES CRITICAL VALUE NOTED.  VALUE IS CONSISTENT WITH PREVIOUSLY REPORTED AND CALLED VALUE.    Culture (A)  Final    PSEUDOMONAS AERUGINOSA SUSCEPTIBILITIES PERFORMED ON PREVIOUS CULTURE WITHIN THE LAST 5 DAYS. Performed at [facility], [place] [address], [place], [place] [address]    Report Status 07/12/2018 FINAL  Final  Culture, blood (Routine X 2) w Reflex to ID Panel     Status: None (Preliminary result)   Collection Time: 07/11/18  8:00 AM  Result Value Ref Range Status   Specimen Description   Final    BLOOD RIGHT ANTECUBITAL Performed at [facility] [address] [place], [place] [address]    Special Requests   Final      BOTTLES DRAWN AEROBIC ONLY Blood Culture adequate volume Performed at [facility] [address], [place], [place] [address]    Culture   Final    NO GROWTH 2 DAYS Performed at [facility] [address], [place], [place] [address]    Report Status PENDING  Incomplete    Studies/Results: Mr 3d Recon At Scanner  Result Date: 07/12/2018 CLINICAL DATA:  Pancreatic cancer. EXAM: MRI ABDOMEN WITHOUT AND WITH CONTRAST (INCLUDING MRCP) TECHNIQUE: Multiplanar multisequence MR imaging of the abdomen was performed both before and after the administration of intravenous contrast. Heavily T2-weighted images of the biliary and pancreatic ducts were obtained, and three-dimensional MRCP images were rendered by post processing. CONTRAST:  4 cc Gadavist. COMPARISON:  MRI 06/19/2018 FINDINGS: Lower chest: No acute findings. Hepatobiliary: Innumerable peripherally enhancing cystic lesions are noted involving predominantly the medial segment of left lobe of liver and right lobe of liver. Dominant lesion within segment 8 measures 3.6 cm, image 16/3. Previously 2.9. Lesion within segment 6 measures 3.0 cm, image 30/6. Previously 3.0 cm. There appears to be a small amount of gas within this lesion. Also within segment 6 is a 2.3 cm lesion, image 34/3. New from previous exam. Lesion within segment 5 measures 2.7 cm, image 31/3. Previously 1.8 cm. Anterior subcapsular lesion within segment 8 measures 1.5 cm, image 19/3. Previously 1.6 cm. There is marked dilatation of the intrahepatic bile ducts to the left lobe of liver, image 48/12. This has increased and caliber from the previous exam. There is mild dilatation of the intrahepatic bile ducts to the medial segment and right lobe of liver. There is a 2.3 cm area of high-grade stricturing involving the proximal left hepatic duct, image number 46/12. Findings are suspicious for malignant stricture. Common bile duct stent is in place. Filling  defects within the common bile duct stent are identified. Cannot confirm stent patency. Pancreas: Large infiltrative mass centered around the head of pancreas is again noted. This is similar to 06/16/2018 measuring 3.6 x 6.6 cm with involvement of the celiac and superior mesenteric artery. 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 The mass extends into the retroperitoneum to encase the IVC and abdominal aorta, similar to previous study. Spleen: The spleen is enlarged measuring 16 cm in cranial caudal dimension. Adrenals/Urinary Tract: The adrenal glands are unremarkable. No kidney mass or hydronephrosis identified. Stomach/Bowel: Moderate distension of the stomach. There is mild increase caliber of the small bowel loops. No pathologic dilatation of the colon. Vascular/Lymphatic: Abdominal aorta and IVC are normal in caliber. There is soft tissue encasement of both the IVC and abdominal aorta by pancreatic head mass. 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 No focal adenopathy. Other: Large volume of ascites is identified. Significantly increased from 06/16/2018. Musculoskeletal: No suspicious bone lesions identified. IMPRESSION: 1. Large pancreatic head mass is again identified with extensive vascular involvement and extension into the retroperitoneum with involvement of the aorta and IVC. 2. Multifocal liver lesions are again noted. Some of these appears stable while others are increased in size in the interval. As mentioned previously some of these liver lesions may reflect either infected metastasis or hepatic abscesses.  3. Progressive intrahepatic biliary dilatation to the lateral segment of left lobe of liver. Persistent mild biliary dilatation to the right lobe of liver. Cannot confirm patency of common bile duct stent. 4. Increase in volume of ascites. Electronically Signed   By: Kerby Moors M.D.   On:  07/12/2018 15:42   Mr Abdomen Mrcp Moise Boring Contast  Result Date: 07/12/2018 CLINICAL DATA:  Pancreatic cancer. EXAM: MRI ABDOMEN WITHOUT AND WITH CONTRAST (INCLUDING MRCP) TECHNIQUE: Multiplanar multisequence MR imaging of the abdomen was performed both before and after the administration of intravenous contrast. Heavily T2-weighted images of the biliary and pancreatic ducts were obtained, and three-dimensional MRCP images were rendered by post processing. CONTRAST:  4 cc Gadavist. COMPARISON:  MRI 06/19/2018 FINDINGS: Lower chest: No acute findings. Hepatobiliary: Innumerable peripherally enhancing cystic lesions are noted involving predominantly the medial segment of left lobe of liver and right lobe of liver. Dominant lesion within segment 8 measures 3.6 cm, image 16/3. Previously 2.9. Lesion within segment 6 measures 3.0 cm, image 30/6. Previously 3.0 cm. There appears to be a small amount of gas within this lesion. Also within segment 6 is a 2.3 cm lesion, image 34/3. New from previous exam. Lesion within segment 5 measures 2.7 cm, image 31/3. Previously 1.8 cm. Anterior subcapsular lesion within segment 8 measures 1.5 cm, image 19/3. Previously 1.6 cm. There is marked dilatation of the intrahepatic bile ducts to the left lobe of liver, image 48/12. This has increased and caliber from the previous exam. There is mild dilatation of the intrahepatic bile ducts to the medial segment and right lobe of liver. There is a 2.3 cm area of high-grade stricturing involving the proximal left hepatic duct, image number 46/12. Findings are suspicious for malignant stricture. Common bile duct stent is in place. Filling defects within the common bile duct stent are identified. Cannot confirm stent patency. Pancreas: Large infiltrative mass centered around the head of pancreas is again noted. This is similar to 06/16/2018 measuring 3.6 x 6.6 cm with involvement of the celiac and superior mesenteric artery. 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 The mass extends into the retroperitoneum to encase the IVC and abdominal aorta, similar to previous study. Spleen: The spleen is enlarged measuring 16 cm in cranial caudal dimension. Adrenals/Urinary Tract: The adrenal glands are unremarkable. No kidney mass or hydronephrosis identified. Stomach/Bowel: Moderate distension of the stomach. There is mild increase caliber of the small bowel loops. No pathologic dilatation of the colon. Vascular/Lymphatic: Abdominal aorta and IVC are normal in caliber. There is soft tissue encasement of both the IVC and abdominal aorta by pancreatic head mass. 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 No focal adenopathy. Other: Large volume of ascites is identified. Significantly increased from 06/16/2018. Musculoskeletal: No suspicious bone lesions identified. IMPRESSION: 1. Large pancreatic head mass is again identified with extensive vascular involvement and extension into the retroperitoneum with involvement of the aorta and IVC. 2. Multifocal liver lesions are again noted. Some of these appears stable while others are increased in size in the interval. As mentioned previously some of these liver lesions may reflect either infected metastasis or hepatic abscesses. 3. Progressive intrahepatic biliary dilatation to the lateral segment of left lobe of liver. Persistent mild biliary dilatation to the right lobe of liver. Cannot confirm patency of common bile duct stent. 4. Increase in volume of ascites. Electronically Signed   By: Queen Slough.D.  On: 07/12/2018 15:42   Ir Abdomen US Limited  Result Date: 07/13/2018 INDICATION: Biliary obstruction.  Ascites. EXAM: ULTRASOUND GUIDED PARACENTESIS, LIMITED ABDOMINAL ULTRASOUND, ABORTED PERCUTANEOUS TRANSHEPATIC CHOLANGIOGRAM AND BILIARY DRAIN MEDICATIONS: None. COMPLICATIONS: None immediate. PROCEDURE:  Informed written consent was obtained from the patient after a discussion of the risks, benefits and alternatives to treatment. A timeout was performed prior to the initiation of the procedure. Imaging of the upper abdomen at the location of the left liver demonstrates no significant dilated left hepatic ducts. The left lobe of the liver is very atrophic. External biliary drain procedure was aborted. Initial ultrasound scanning demonstrates a large amount of ascites within the right lower abdominal quadrant. The right lower abdomen was prepped and draped in the usual sterile fashion. 1% lidocaine with epinephrine was used for local anesthesia. Following this, a 6 Fr Safe-T-Centesis catheter was introduced. An ultrasound image was saved for documentation purposes. The paracentesis was performed. The catheter was removed and a dressing was applied. The patient tolerated the procedure well without immediate post procedural complication. FINDINGS: Imaging of the left lobe of the liver demonstrates no significant dilated left hepatic ducts. A total of approximately 2.1 L of clear yellow fluid was removed. IMPRESSION: The biliary drain procedure into the left lobe of the liver was aborted. There were no suitable targets for entry as there were no significantly dilated bile ducts. Imaging over the right lobe of the liver demonstrates decompressed docs and multiple cystic areas. Successful ultrasound-guided paracentesis yielding 2.1 liters of peritoneal fluid. Electronically Signed   By: Marybelle Killings M.D.   On: 07/13/2018 15:33   Ir Paracentesis  Result Date: 07/13/2018 INDICATION: Biliary obstruction.  Ascites. EXAM: ULTRASOUND GUIDED PARACENTESIS, LIMITED ABDOMINAL ULTRASOUND, ABORTED PERCUTANEOUS TRANSHEPATIC CHOLANGIOGRAM AND BILIARY DRAIN MEDICATIONS: None. COMPLICATIONS: None immediate. PROCEDURE: Informed written consent was obtained from the patient after a discussion of the risks, benefits and alternatives to  treatment. A timeout was performed prior to the initiation of the procedure. Imaging of the upper abdomen at the location of the left liver demonstrates no significant dilated left hepatic ducts. The left lobe of the liver is very atrophic. External biliary drain procedure was aborted. Initial ultrasound scanning demonstrates a large amount of ascites within the right lower abdominal quadrant. The right lower abdomen was prepped and draped in the usual sterile fashion. 1% lidocaine with epinephrine was used for local anesthesia. Following this, a 6 Fr Safe-T-Centesis catheter was introduced. An ultrasound image was saved for documentation purposes. The paracentesis was performed. The catheter was removed and a dressing was applied. The patient tolerated the procedure well without immediate post procedural complication. FINDINGS: Imaging of the left lobe of the liver demonstrates no significant dilated left hepatic ducts. A total of approximately 2.1 L of clear yellow fluid was removed. IMPRESSION: The biliary drain procedure into the left lobe of the liver was aborted. There were no suitable targets for entry as there were no significantly dilated bile ducts. Imaging over the right lobe of the liver demonstrates decompressed docs and multiple cystic areas. Successful ultrasound-guided paracentesis yielding 2.1 liters of peritoneal fluid. Electronically Signed   By: Marybelle Killings M.D.   On: 07/13/2018 15:33      Assessment/Plan:  INTERVAL HISTORY: Repeat blood cultures have remained negative she is undergone paracentesis  Principal Problem:   Pseudomonal bacteremia Active Problems:   Chronic pancreatitis (HCC)   Microcytic anemia   HTN (hypertension)   Elevated LFTs   Pancreatic cancer metastasized to liver ([facility])  Cancer related pain   Protein-calorie malnutrition, moderate ([facility])   Thrombocytopenia ([facility])   Cancer of head of pancreas ([facility])   Sepsis ([facility] [facility])    Crystal Carlson is a 53 y.o. female with  age for pancreatic cancer found to have pseudomonal bacteremia.  She does have a port in place.  Attempts being made to "treat through the port"  #1 pseudomonal bacteremia with port in place: This is a very "sticky" organism and I worry about it being difficult to eradicate and that presence of a port.  My preference from an infection disease standpoint would be to get rid of the port though I am willing to try to treat through it I have a high anxiety we will been experiencing a recurrence infection.   LOS: 4 days   Alcide Evener 07/13/2018, 5:13 PM

## 2018-07-13 NOTE — Sedation Documentation (Signed)
MD not going to attempt ptc

## 2018-07-13 NOTE — Progress Notes (Addendum)
Referring Physician(s): Sherrill,B  Supervising Physician: Marybelle Killings  Patient Status:  [facility] - In-pt  Chief Complaint:  Pancreatic cancer, biliary obstruction, bacteremia  Subjective: Pt familiar to IR service from prior liver mass bx on 05/26/17, port a cath placement on 06/04/17, multiple paracenteses and liver cyst aspiration on 06/22/18. She has a known hx of stage IV pancreatic cancer with biliary obstruction and prior CBD stenting by GI on 06/09/17 (covered metal stent) and again on 07/05/18 (uncovered metal stent). She presents now with hx fever/chills/pseudomonas bacteremia and persistent hyperbilirubinemia, currently with total bilirubin of 7.5. MRI abd done yesterday revealed known pancreatic head mass with extensive vascular involvement and extension into RP with involvement of aorta and IVC, multiple liver lesions, progressive intrahepatic biliary dilatation to lateral segment of left lobe of liver and mild biliary dilatation of right lobe of liver along with increase in ascites. She is currently afebrile with WBC 27.6, HGB 8.3, PLTS 189K, creat 0.65, PT/INR 19.9/1.71. Request now received from oncology  for PTC with biliary drain/possible paracentesis. Additional med hx as below. She denies HA, CP, dyspnea, cough, back pain,N/V or bleeding. She does have intermittent abd pain and some distension.   Past Medical History:  Diagnosis Date  . Anxiety   . Cancer of pancreas ([place]) 05/26/2017   chemo  . Chronic pancreatitis ([place]) 05/19/2017  . Depression   . Edema    LE  . Liver metastases ([place]) 05/26/2017  . Microcytic anemia 05/19/2017  . Prediabetes 2016   when living in [place]   Past Surgical History:  Procedure Laterality Date  . BILIARY STENT PLACEMENT N/A 07/05/2018   Procedure: BILIARY STENT PLACEMENT;  Surgeon: Clarene Essex, MD;  Location: [facility];  Service: Endoscopy;  Laterality: N/A;  . ENDOSCOPIC RETROGRADE CHOLANGIOPANCREATOGRAPHY (ERCP) WITH PROPOFOL N/A  07/05/2018   Procedure: ENDOSCOPIC RETROGRADE CHOLANGIOPANCREATOGRAPHY (ERCP) WITH PROPOFOL;  Surgeon: Clarene Essex, MD;  Location: [facility];  Service: Endoscopy;  Laterality: N/A;  with Balloon Sweep for Stones  . ERCP N/A 06/09/2017   Procedure: ENDOSCOPIC RETROGRADE CHOLANGIOPANCREATOGRAPHY (ERCP);  Surgeon: Clarene Essex, MD;  Location: [place];  Service: Endoscopy;  Laterality: N/A;  . IR FLUORO GUIDE PORT INSERTION RIGHT  06/04/2017  . IR PARACENTESIS  04/13/2018  . IR PARACENTESIS  04/30/2018  . IR PARACENTESIS  05/12/2018  . IR US GUIDE VASC ACCESS RIGHT  06/04/2017  . LUMBAR LAMINECTOMY/DECOMPRESSION MICRODISCECTOMY Left 05/17/2018   Procedure: Left Lumbar Five-Sacral One Microdiscectomy;  Surgeon: Earnie Larsson, MD;  Location: [place];  Service: Neurosurgery;  Laterality: Left;  . SPHINCTEROTOMY  07/05/2018   Procedure: SPHINCTEROTOMY;  Surgeon: Clarene Essex, MD;  Location: [facility];  Service: Endoscopy;;      Allergies: Pork-derived products  Medications: Prior to Admission medications   Medication Sig Start Date End Date Taking? Authorizing Provider  amoxicillin-clavulanate (AUGMENTIN) 875-125 MG tablet Take 1 tablet by mouth 2 (two) times daily. 06/24/18 08/23/18 Yes Danford, Suann Larry, MD  ferrous sulfate 325 (65 FE) MG EC tablet Take 1 tablet (325 mg total) by mouth 3 (three) times daily with meals. Patient taking differently: Take 325 mg by mouth 2 (two) times daily.  06/18/17  Yes Ladell Pier, MD  hydrocortisone cream 1 % Apply 1 application topically 3 (three) times daily as needed for itching (minor skin irritation). 07/27/17  Yes Theodis Blaze, MD  HYDROmorphone (DILAUDID) 4 MG tablet Take 1-2 tablets (4-8 mg total) by mouth every 4 (four) hours as needed for severe pain. 07/07/18  Yes Owens Shark, NP  lactose free nutrition (BOOST PLUS) LIQD Take 237 mLs by mouth 3 (three) times daily with meals. 06/24/18  Yes Danford, Suann Larry, MD  lidocaine-prilocaine  (EMLA) cream Apply to port site one hour prior to use. Do not rub in. Cover with plastic. Patient taking differently: Apply 1 application topically daily as needed (prior to chemotherapy). Apply to port site one hour prior to use. Do not rub in. Cover with plastic. 11/17/17  Yes Owens Shark, NP  LORazepam (ATIVAN) 0.5 MG tablet Take 1 tablet (0.5 mg total) by mouth every 8 (eight) hours as needed for anxiety (or nausea). 06/30/18  Yes Ladell Pier, MD  magnesium citrate SOLN Take 0.75 Bottles by mouth daily as needed for mild constipation.    Yes [provider]  morphine (MS CONTIN) 15 MG 12 hr tablet Take 1 tablet (15 mg total) by mouth every 12 (twelve) hours. 04/14/18  Yes Ladell Pier, MD  predniSONE (DELTASONE) 5 MG tablet Take 1 tablet (5 mg total) by mouth daily. 06/30/18  Yes Ladell Pier, MD     Vital Signs: BP 106/77 (BP Location: Right Arm)   Pulse 94   Temp 98.2 F (36.8 C) (Oral)   Resp 17   Ht _0  (1.651 m)   Wt 123 lb 3.8 oz (55.9 kg)   SpO2 100%   BMI 20.51 kg/m   Physical Exam awake/alert; scleral icterus; chest- CTA bilat; clean, intact rt chest PAC; heart- RRR; abd- sl dist,+BS,NT; no LE edema  Imaging: Mr 3d Recon At Scanner  Result Date: 07/12/2018 CLINICAL DATA:  Pancreatic cancer. EXAM: MRI ABDOMEN WITHOUT AND WITH CONTRAST (INCLUDING MRCP) TECHNIQUE: Multiplanar multisequence MR imaging of the abdomen was performed both before and after the administration of intravenous contrast. Heavily T2-weighted images of the biliary and pancreatic ducts were obtained, and three-dimensional MRCP images were rendered by post processing. CONTRAST:  4 cc Gadavist. COMPARISON:  MRI 06/19/2018 FINDINGS: Lower chest: No acute findings. Hepatobiliary: Innumerable peripherally enhancing cystic lesions are noted involving predominantly the medial segment of left lobe of liver and right lobe of liver. Dominant lesion within segment 8 measures 3.6 cm, image 16/3.  Previously 2.9. Lesion within segment 6 measures 3.0 cm, image 30/6. Previously 3.0 cm. There appears to be a small amount of gas within this lesion. Also within segment 6 is a 2.3 cm lesion, image 34/3. New from previous exam. Lesion within segment 5 measures 2.7 cm, image 31/3. Previously 1.8 cm. Anterior subcapsular lesion within segment 8 measures 1.5 cm, image 19/3. Previously 1.6 cm. There is marked dilatation of the intrahepatic bile ducts to the left lobe of liver, image 48/12. This has increased and caliber from the previous exam. There is mild dilatation of the intrahepatic bile ducts to the medial segment and right lobe of liver. There is a 2.3 cm area of high-grade stricturing involving the proximal left hepatic duct, image number 46/12. Findings are suspicious for malignant stricture. Common bile duct stent is in place. Filling defects within the common bile duct stent are identified. Cannot confirm stent patency. Pancreas: Large infiltrative mass centered around the head of pancreas is again noted. This is similar to 06/16/2018 measuring 3.6 x 6.6 cm with involvement of the celiac and superior mesenteric artery. 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 The mass extends into the retroperitoneum to encase the IVC and abdominal aorta, similar to previous study. Spleen:  The spleen is enlarged measuring 16 cm in cranial caudal dimension. Adrenals/Urinary Tract: The adrenal glands are unremarkable. No kidney mass or hydronephrosis identified. Stomach/Bowel: Moderate distension of the stomach. There is mild increase caliber of the small bowel loops. No pathologic dilatation of the colon. Vascular/Lymphatic: Abdominal aorta and IVC are normal in caliber. There is soft tissue encasement of both the IVC and abdominal aorta by pancreatic head mass. 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 No focal  adenopathy. Other: Large volume of ascites is identified. Significantly increased from 06/16/2018. Musculoskeletal: No suspicious bone lesions identified. IMPRESSION: 1. Large pancreatic head mass is again identified with extensive vascular involvement and extension into the retroperitoneum with involvement of the aorta and IVC. 2. Multifocal liver lesions are again noted. Some of these appears stable while others are increased in size in the interval. As mentioned previously some of these liver lesions may reflect either infected metastasis or hepatic abscesses. 3. Progressive intrahepatic biliary dilatation to the lateral segment of left lobe of liver. Persistent mild biliary dilatation to the right lobe of liver. Cannot confirm patency of common bile duct stent. 4. Increase in volume of ascites. Electronically Signed   By: Kerby Moors M.D.   On: 07/12/2018 15:42   Mr Abdomen Mrcp Moise Boring Contast  Result Date: 07/12/2018 CLINICAL DATA:  Pancreatic cancer. EXAM: MRI ABDOMEN WITHOUT AND WITH CONTRAST (INCLUDING MRCP) TECHNIQUE: Multiplanar multisequence MR imaging of the abdomen was performed both before and after the administration of intravenous contrast. Heavily T2-weighted images of the biliary and pancreatic ducts were obtained, and three-dimensional MRCP images were rendered by post processing. CONTRAST:  4 cc Gadavist. COMPARISON:  MRI 06/19/2018 FINDINGS: Lower chest: No acute findings. Hepatobiliary: Innumerable peripherally enhancing cystic lesions are noted involving predominantly the medial segment of left lobe of liver and right lobe of liver. Dominant lesion within segment 8 measures 3.6 cm, image 16/3. Previously 2.9. Lesion within segment 6 measures 3.0 cm, image 30/6. Previously 3.0 cm. There appears to be a small amount of gas within this lesion. Also within segment 6 is a 2.3 cm lesion, image 34/3. New from previous exam. Lesion within segment 5 measures 2.7 cm, image 31/3. Previously 1.8 cm.  Anterior subcapsular lesion within segment 8 measures 1.5 cm, image 19/3. Previously 1.6 cm. There is marked dilatation of the intrahepatic bile ducts to the left lobe of liver, image 48/12. This has increased and caliber from the previous exam. There is mild dilatation of the intrahepatic bile ducts to the medial segment and right lobe of liver. There is a 2.3 cm area of high-grade stricturing involving the proximal left hepatic duct, image number 46/12. Findings are suspicious for malignant stricture. Common bile duct stent is in place. Filling defects within the common bile duct stent are identified. Cannot confirm stent patency. Pancreas: Large infiltrative mass centered around the head of pancreas is again noted. This is similar to 06/16/2018 measuring 3.6 x 6.6 cm with involvement of the celiac and superior mesenteric artery. 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 The mass extends into the retroperitoneum to encase the IVC and abdominal aorta, similar to previous study. Spleen: The spleen is enlarged measuring 16 cm in cranial caudal dimension. Adrenals/Urinary Tract: The adrenal glands are unremarkable. No kidney mass or hydronephrosis identified. Stomach/Bowel: Moderate distension of the stomach. There is mild increase caliber of the small bowel loops. No pathologic dilatation of the colon. Vascular/Lymphatic: Abdominal  aorta and IVC are normal in caliber. There is soft tissue encasement of both the IVC and abdominal aorta by pancreatic head mass. 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 No focal adenopathy. Other: Large volume of ascites is identified. Significantly increased from 06/16/2018. Musculoskeletal: No suspicious bone lesions identified. IMPRESSION: 1. Large pancreatic head mass is again identified with extensive vascular involvement and extension into the retroperitoneum with involvement  of the aorta and IVC. 2. Multifocal liver lesions are again noted. Some of these appears stable while others are increased in size in the interval. As mentioned previously some of these liver lesions may reflect either infected metastasis or hepatic abscesses. 3. Progressive intrahepatic biliary dilatation to the lateral segment of left lobe of liver. Persistent mild biliary dilatation to the right lobe of liver. Cannot confirm patency of common bile duct stent. 4. Increase in volume of ascites. Electronically Signed   By: Kerby Moors M.D.   On: 07/12/2018 15:42    Labs:  CBC: Recent Labs    07/09/18 1717 07/10/18 0600 07/11/18 0501 07/13/18 0541  WBC 25.6* 20.8* 18.1* 27.6*  HGB 9.5* 8.0* 8.6* 8.3*  HCT 30.8* 25.6* 28.4* 27.1*  PLT 228 176 201 189    COAGS: Recent Labs    11/11/17 1931 01/30/18 1400 06/16/18 1724 06/21/18 1048 07/10/18 0600  INR 1.35 1.24 1.34 1.62 1.62  APTT 40*  --   --   --  28    BMP: Recent Labs    07/08/18 1112 07/09/18 1717 07/10/18 0600 07/11/18 0501  NA 126* 123* 126* 131*  K 5.3* 5.0 4.5 4.2  CL 89* 88* 94* 102  CO2 _0 21*  GLUCOSE 234* 204* 139* 90  BUN 39* 37* 31* 23*  CALCIUM 9.3 8.4* 8.1* 8.3*  CREATININE 1.25* 0.87 0.71 0.65  GFRNONAA 49* >60 >60 >60  GFRAA 57* >60 >60 >60    LIVER FUNCTION TESTS: Recent Labs    07/08/18 1112 07/09/18 1717 07/10/18 0600 07/11/18 0501  BILITOT 9.0* 9.0* 7.2* 7.5*  AST 52* 38 29 39  ALT 103* 73* 56* 50*  ALKPHOS 332* 258* 212* 237*  PROT 7.3 6.9 5.8* 5.8*  ALBUMIN 2.1* 2.0* 1.8* 1.7*    Assessment and Plan:  Pt with known hx of stage IV pancreatic cancer with biliary obstruction and prior CBD stenting by GI on 06/09/17 (covered metal stent) and again on 07/05/18 (uncovered metal stent). She presents now with hx fever/chills/pseudomonas bacteremia and persistent hyperbilirubinemia, currently with total bilirubin of 7.5. MRI abd done yesterday revealed known pancreatic head mass  with extensive vascular involvement and extension into RP with involvement of aorta and IVC, multiple liver lesions, progressive intrahepatic biliary dilatation to lateral segment of left lobe of liver and mild biliary dilatation of right lobe of liver along with increase in ascites. She is currently afebrile with WBC 27.6, HGB 8.3, PLTS 189K, creat 0.65, PT/INR 19.9/1.71.  Request now received from oncology  for PTC with biliary drain/possible paracentesis. Latest imaging studies were reviewed by Dr. Barbie Banner. Risks and benefits discussed with the patient/mother including bleeding, infection, damage to adjacent structures, need for long term biliary drainage and sepsis.  All of the patient's questions were answered, patient is agreeable to proceed. Consent signed and in chart.  Pt will need INR closer to 1.5 before procedure can be safely done; if she becomes septic then will proceed with understanding of increased bleeding risks.    Electronically Signed: D. Rowe Robert,  PA-C 07/13/2018, 9:04 AM   I spent a total of 25 minutes at the the patient's bedside AND on the patient's hospital floor or unit, greater than 50% of which was counseling/coordinating care for transhepatic cholangiogram with biliary drain/possible paracentesis.     Patient ID: Coline Calkin, female   DOB: 10-17-1965, 52 y.o.   MRN: [id number]

## 2018-07-13 NOTE — Procedures (Signed)
Paracentesis yielding 2.1 liters Aborted PTC EBL 0 Comp 0

## 2018-07-14 LAB — CBC
HEMATOCRIT: 27.4 % — AB (ref 36.0–46.0)
Hemoglobin: 8.4 g/dL — ABNORMAL LOW (ref 12.0–15.0)
MCH: 26.5 pg (ref 26.0–34.0)
MCHC: 30.7 g/dL (ref 30.0–36.0)
MCV: 86.4 fL (ref 80.0–100.0)
Platelets: 186 10*3/uL (ref 150–400)
RBC: 3.17 MIL/uL — ABNORMAL LOW (ref 3.87–5.11)
RDW: 25.3 % — ABNORMAL HIGH (ref 11.5–15.5)
WBC: 28.4 10*3/uL — AB (ref 4.0–10.5)
nRBC: 0 % (ref 0.0–0.2)

## 2018-07-14 LAB — COMPREHENSIVE METABOLIC PANEL
ALBUMIN: 1.7 g/dL — AB (ref 3.5–5.0)
ALT: 41 U/L (ref 0–44)
ANION GAP: 4 — AB (ref 5–15)
AST: 39 U/L (ref 15–41)
Alkaline Phosphatase: 256 U/L — ABNORMAL HIGH (ref 38–126)
BUN: 23 mg/dL — ABNORMAL HIGH (ref 6–20)
CHLORIDE: 106 mmol/L (ref 98–111)
CO2: 19 mmol/L — AB (ref 22–32)
Calcium: 8.3 mg/dL — ABNORMAL LOW (ref 8.9–10.3)
Creatinine, Ser: 0.64 mg/dL (ref 0.44–1.00)
GFR calc non Af Amer: >60 mL/min (ref >60)
Glucose, Bld: 111 mg/dL — ABNORMAL HIGH (ref 70–99)
POTASSIUM: 3.9 mmol/L (ref 3.5–5.1)
Sodium: 129 mmol/L — ABNORMAL LOW (ref 135–145)
TOTAL PROTEIN: 6.5 g/dL (ref 6.5–8.1)
Total Bilirubin: 9.7 mg/dL — ABNORMAL HIGH (ref 0.3–1.2)

## 2018-07-14 LAB — BPAM FFP
Blood Product Expiration Date: 201912012359
ISSUE DATE / TIME: 201911261140
UNIT TYPE AND RH: 5100

## 2018-07-14 LAB — PREPARE FRESH FROZEN PLASMA: Unit division: 0

## 2018-07-14 LAB — GLUCOSE, CAPILLARY
GLUCOSE-CAPILLARY: 102 mg/dL — AB (ref 70–99)
Glucose-Capillary: 108 mg/dL — ABNORMAL HIGH (ref 70–99)
Glucose-Capillary: 169 mg/dL — ABNORMAL HIGH (ref 70–99)
Glucose-Capillary: 139 mg/dL — ABNORMAL HIGH (ref 70–99)

## 2018-07-14 MED ORDER — MORPHINE SULFATE ER 15 MG PO TBCR
15.0000 mg | EXTENDED_RELEASE_TABLET | Freq: Two times a day (BID) | ORAL | Status: DC
  Administered 2018-07-14 – 2018-07-16 (×5): 15 mg via ORAL
  Filled 2018-07-14 (×5): qty 1

## 2018-07-14 MED ORDER — HYDROMORPHONE HCL 1 MG/ML IJ SOLN
1.0000 mg | INTRAMUSCULAR | Status: DC | PRN
  Administered 2018-07-14 (×2): 1 mg via INTRAVENOUS
  Administered 2018-07-14 – 2018-07-15 (×3): 2 mg via INTRAVENOUS
  Filled 2018-07-14 (×4): qty 2
  Filled 2018-07-14: qty 1
  Filled 2018-07-14: qty 2
  Filled 2018-07-14: qty 1

## 2018-07-14 NOTE — Progress Notes (Signed)
[facility]  PROGRESS NOTE  Crystal Carlson ZOX:[id number] DOB: 1965/08/23 DOA: 07/09/2018 PCP: Mack Hook, MD   Brief HPI:   52 year old female with a history of stage IV metastatic pancreatic cancer on chemotherapy, chronic anemia who was sent to the ED after blood cultures done as outpatient was positive for Pseudomonas.  She was admitted here 5 weeks ago with fever and chills for worsening metastatic disease and was found to have possible liver abscess and was discharged oral antibiotics.  ID and oncology has been following patient for Pseudomonas bacteremia.    Subjective   Patient seen and examined yesterday IR could not place a transhepatic drain.    Assessment/Plan:     1. Pseudomonas bacteremia-outpatient blood cultures grew Pseudomonas.  Patient started on cefepime.  Repeat blood cultures from 07/11/2018 are negative.  Patient has Port-A-Cath in place, continue with cefepime.  ID following.  2. Leukocytosis-WBC worsening, 28,000.  Continue cefepime as above.  3. Stage IV pancreatic cancer-followed by oncology as outpatient.  She was on chemotherapy.  Recently had biliary stent exchange by GI for metastatic pancreatic cancer on 07/05/2018.  ID consulted GI, [facility] recommended MRCP to check for stent position.  MRCP shows infected metastasis or hepatic abscesses.  IR consulted for biliary drain/possible paracentesis.  GI has signed off.  Patient not ready for hospice.  Oncologist Dr. Malachy Mood  has discussed with family regarding second opinion.  Family will let us know in 1 to 2 days.  4. Elevated INR- INR was 1.7 , 1 unit of FFP given for IR procedure as I have needed to be less than 1.5.  5. Severe protein calorie malnutrition-nutrition consulted  6. Hyponatremia-improved with IV fluids.  Today sodium 131.  7. Diabetes mellitus type 2-continue sliding scale insulin with NovoLog, CBG stable.      CBG: Recent Labs  Lab 07/13/18 1131 07/13/18 1637  07/13/18 2055 07/14/18 0837 07/14/18 1151  GLUCAP 157* 99 151* 108* 102*    CBC: Recent Labs  Lab 07/08/18 1112 07/09/18 1717 07/10/18 0600 07/11/18 0501 07/13/18 0541 07/14/18 0537  WBC 25.9* 25.6* 20.8* 18.1* 27.6* 28.4*  NEUTROABS 23.8* 23.3*  --  14.9*  --   --   HGB 9.9* 9.5* 8.0* 8.6* 8.3* 8.4*  HCT 30.5* 30.8* 25.6* 28.4* 27.1* 27.4*  MCV 80.1 82.4 83.4 85.0 84.7 86.4  PLT 182 228 176 201 189 811    Basic Metabolic Panel: Recent Labs  Lab 07/08/18 1112 07/09/18 1717 07/10/18 0600 07/11/18 0501 07/14/18 0537  NA 126* 123* 126* 131* 129*  K 5.3* 5.0 4.5 4.2 3.9  CL 89* 88* 94* 102 106  CO2 27 25 25  21* 19*  GLUCOSE 234* 204* 139* 90 111*  BUN 39* 37* 31* 23* 23*  CREATININE 1.25* 0.87 0.71 0.65 0.64  CALCIUM 9.3 8.4* 8.1* 8.3* 8.3*  MG  --  2.4  --   --   --      DVT prophylaxis: Heparin  Code Status: Full code  Family Communication: Discussed with family member at bedside  Disposition Plan: likely home when medically ready for discharge   Consultants:  ID  Oncology  Procedures: None   Antibiotics:   Anti-infectives (From admission, onward)   Start     Dose/Rate Route Frequency Ordered Stop   07/10/18 1400  ceFEPIme (MAXIPIME) 2 g in sodium chloride 0.9 % 100 mL IVPB     2 g 200 mL/hr over 30 Minutes Intravenous Every 8 hours 07/10/18 1344  07/09/18 1730  ceFEPIme (MAXIPIME) 2 g in sodium chloride 0.9 % 100 mL IVPB  Status:  Discontinued     2 g 200 mL/hr over 30 Minutes Intravenous Every 12 hours 07/09/18 1649 07/10/18 1344       Objective   Vitals:   07/13/18 2058 07/14/18 0611 07/14/18 1150 07/14/18 1341  BP: 102/86 118/88  118/87  Pulse: (!) 102 (!) 114  97  Resp: 20 20  19   Temp: 100.3 F (37.9 C) 98.4 F (36.9 C)  98.1 F (36.7 C)  TempSrc: Oral Oral  Oral  SpO2: 99% 100% 97% 97%  Weight:  49.9 kg    Height:        Intake/Output Summary (Last 24 hours) at 07/14/2018 1600 Last data filed at 07/14/2018  0745 Gross per 24 hour  Intake 400 ml  Output -  Net 400 ml   Filed Weights   07/10/18 0500 07/13/18 0521 07/14/18 0611  Weight: 48.5 kg 55.9 kg 49.9 kg     Physical Examination:      General: Appears in no acute distress  Cardiovascular: S1-S2, regular, no murmurs auscultated  Respiratory: Clear to auscultation bilaterally  Abdomen: Soft, mild generalized tenderness, mild distention  Musculoskeletal: No edema of the lower extremities       Data Reviewed: I have personally reviewed following labs and imaging studies   Recent Results (from the past 240 hour(s))  Culture, Blood     Status: Abnormal   Collection Time: 07/08/18 11:12 AM  Result Value Ref Range Status   Specimen Description BLOOD PORTA CATH RIGHT  Final   Special Requests   Final    BOTTLES DRAWN AEROBIC AND ANAEROBIC Blood Culture adequate volume   Culture  Setup Time   Final    GRAM NEGATIVE RODS IN BOTH AEROBIC AND ANAEROBIC BOTTLES CRITICAL RESULT CALLED TO, READ BACK BY AND VERIFIED WITH: S CARDWELL RN [id number] BY GF Performed at [facility], [place] [address] [place], [place] [address]    Culture PSEUDOMONAS AERUGINOSA (A)  Final   Report Status 07/11/2018 FINAL  Final   Organism ID, Bacteria PSEUDOMONAS AERUGINOSA  Final      Susceptibility   Pseudomonas aeruginosa - MIC*    CEFTAZIDIME <=1 SENSITIVE Sensitive     CIPROFLOXACIN >=4 RESISTANT Resistant     GENTAMICIN <=1 SENSITIVE Sensitive     IMIPENEM 1 SENSITIVE Sensitive     PIP/TAZO <=4 SENSITIVE Sensitive     CEFEPIME <=1 SENSITIVE Sensitive     * PSEUDOMONAS AERUGINOSA  Blood Culture ID Panel (Reflexed)     Status: Abnormal   Collection Time: 07/08/18 11:12 AM  Result Value Ref Range Status   Enterococcus species NOT DETECTED NOT DETECTED Final   Listeria monocytogenes NOT DETECTED NOT DETECTED Final   Staphylococcus species NOT DETECTED NOT DETECTED Final   Staphylococcus aureus (BCID) NOT DETECTED NOT DETECTED Final    Streptococcus species NOT DETECTED NOT DETECTED Final   Streptococcus agalactiae NOT DETECTED NOT DETECTED Final   Streptococcus pneumoniae NOT DETECTED NOT DETECTED Final   Streptococcus pyogenes NOT DETECTED NOT DETECTED Final   Acinetobacter baumannii NOT DETECTED NOT DETECTED Final   Enterobacteriaceae species NOT DETECTED NOT DETECTED Final   Enterobacter cloacae complex NOT DETECTED NOT DETECTED Final   Escherichia coli NOT DETECTED NOT DETECTED Final   Klebsiella oxytoca NOT DETECTED NOT DETECTED Final   Klebsiella pneumoniae NOT DETECTED NOT DETECTED Final   Proteus species NOT DETECTED NOT DETECTED Final  Serratia marcescens NOT DETECTED NOT DETECTED Final   Carbapenem resistance NOT DETECTED NOT DETECTED Final   Haemophilus influenzae NOT DETECTED NOT DETECTED Final   Neisseria meningitidis NOT DETECTED NOT DETECTED Final   Pseudomonas aeruginosa DETECTED (A) NOT DETECTED Final    Comment: CRITICAL RESULT CALLED TO, READ BACK BY AND VERIFIED WITH: S CAEDWELL RN [id number] BY GF    Candida albicans NOT DETECTED NOT DETECTED Final   Candida glabrata NOT DETECTED NOT DETECTED Final   Candida krusei NOT DETECTED NOT DETECTED Final   Candida parapsilosis NOT DETECTED NOT DETECTED Final   Candida tropicalis NOT DETECTED NOT DETECTED Final    Comment: Performed at [facility], [place] [address], [place], [place] [address]  Culture, blood (Routine X 2) w Reflex to ID Panel     Status: Abnormal   Collection Time: 07/09/18  5:18 PM  Result Value Ref Range Status   Specimen Description   Final    BLOOD BLOOD LEFT ARM Performed at [facility] [address], [place], [place] [address]    Special Requests   Final    BOTTLES DRAWN AEROBIC AND ANAEROBIC Blood Culture adequate volume Performed at [facility] [address], [place], [place] [address]    Culture  Setup Time   Final    GRAM NEGATIVE RODS AEROBIC BOTTLE ONLY CRITICAL  VALUE NOTED.  VALUE IS CONSISTENT WITH PREVIOUSLY REPORTED AND CALLED VALUE.    Culture (A)  Final    PSEUDOMONAS AERUGINOSA SUSCEPTIBILITIES PERFORMED ON PREVIOUS CULTURE WITHIN THE LAST 5 DAYS. Performed at [facility], [place] [address] [place], [place] [address]    Report Status 07/12/2018 FINAL  Final  Culture, blood (Routine X 2) w Reflex to ID Panel     Status: Abnormal   Collection Time: 07/09/18  6:19 PM  Result Value Ref Range Status   Specimen Description   Final    BLOOD PORTA CATH Performed at [facility] [address] [place], [place] [address]    Special Requests   Final    BOTTLES DRAWN AEROBIC AND ANAEROBIC Blood Culture adequate volume Performed at [facility] [address], [place], [place] [address]    Culture  Setup Time   Final    GRAM NEGATIVE RODS IN BOTH AEROBIC AND ANAEROBIC BOTTLES CRITICAL VALUE NOTED.  VALUE IS CONSISTENT WITH PREVIOUSLY REPORTED AND CALLED VALUE.    Culture (A)  Final    PSEUDOMONAS AERUGINOSA SUSCEPTIBILITIES PERFORMED ON PREVIOUS CULTURE WITHIN THE LAST 5 DAYS. Performed at [facility], [place] [address], [place], [place] [address]    Report Status 07/12/2018 FINAL  Final  Culture, blood (Routine X 2) w Reflex to ID Panel     Status: None (Preliminary result)   Collection Time: 07/11/18  8:00 AM  Result Value Ref Range Status   Specimen Description   Final    BLOOD RIGHT ANTECUBITAL Performed at [facility] [address], [place], [place] [address]    Special Requests   Final    BOTTLES DRAWN AEROBIC ONLY Blood Culture adequate volume Performed at [facility] [address], [place], [place] [address]    Culture   Final    NO GROWTH 3 DAYS Performed at [facility], [place] [address], [place], [place] [address]    Report Status PENDING  Incomplete     Liver Function Tests: Recent Labs  Lab 07/08/18 1112 07/09/18 1717  07/10/18 0600 07/11/18 0501 07/14/18 0537  AST 52* 38 29 39 39  ALT 103* 73* 56* 50* 41  ALKPHOS 332* 258* 212* 237* 256*  BILITOT 9.0* 9.0* 7.2* 7.5* 9.7*  PROT 7.3 6.9 5.8* 5.8* 6.5  ALBUMIN 2.1* 2.0* 1.8* 1.7* 1.7*   No results for input(s): LIPASE, AMYLASE in the last 168 hours. Recent Labs  Lab 07/09/18 1722  AMMONIA 55*    Cardiac Enzymes: No results for input(s): CKTOTAL, CKMB, CKMBINDEX, TROPONINI in the last 168 hours. BNP (last 3 results) No results for input(s): BNP in the last 8760 hours.  ProBNP (last 3 results) No results for input(s): PROBNP in the last 8760 hours.    Studies: Ir Abdomen US Limited  Result Date: 07/13/2018 INDICATION: Biliary obstruction.  Ascites. EXAM: ULTRASOUND GUIDED PARACENTESIS, LIMITED ABDOMINAL ULTRASOUND, ABORTED PERCUTANEOUS TRANSHEPATIC CHOLANGIOGRAM AND BILIARY DRAIN MEDICATIONS: None. COMPLICATIONS: None immediate. PROCEDURE: Informed written consent was obtained from the patient after a discussion of the risks, benefits and alternatives to treatment. A timeout was performed prior to the initiation of the procedure. Imaging of the upper abdomen at the location of the left liver demonstrates no significant dilated left hepatic ducts. The left lobe of the liver is very atrophic. External biliary drain procedure was aborted. Initial ultrasound scanning demonstrates a large amount of ascites within the right lower abdominal quadrant. The right lower abdomen was prepped and draped in the usual sterile fashion. 1% lidocaine with epinephrine was used for local anesthesia. Following this, a 6 Fr Safe-T-Centesis catheter was introduced. An ultrasound image was saved for documentation purposes. The paracentesis was performed. The catheter was removed and a dressing was applied. The patient tolerated the procedure well without immediate post procedural complication. FINDINGS: Imaging of the left lobe of the liver demonstrates no significant dilated  left hepatic ducts. A total of approximately 2.1 L of clear yellow fluid was removed. IMPRESSION: The biliary drain procedure into the left lobe of the liver was aborted. There were no suitable targets for entry as there were no significantly dilated bile ducts. Imaging over the right lobe of the liver demonstrates decompressed docs and multiple cystic areas. Successful ultrasound-guided paracentesis yielding 2.1 liters of peritoneal fluid. Electronically Signed   By: Marybelle Killings M.D.   On: 07/13/2018 15:33   Ir Paracentesis  Result Date: 07/13/2018 INDICATION: Biliary obstruction.  Ascites. EXAM: ULTRASOUND GUIDED PARACENTESIS, LIMITED ABDOMINAL ULTRASOUND, ABORTED PERCUTANEOUS TRANSHEPATIC CHOLANGIOGRAM AND BILIARY DRAIN MEDICATIONS: None. COMPLICATIONS: None immediate. PROCEDURE: Informed written consent was obtained from the patient after a discussion of the risks, benefits and alternatives to treatment. A timeout was performed prior to the initiation of the procedure. Imaging of the upper abdomen at the location of the left liver demonstrates no significant dilated left hepatic ducts. The left lobe of the liver is very atrophic. External biliary drain procedure was aborted. Initial ultrasound scanning demonstrates a large amount of ascites within the right lower abdominal quadrant. The right lower abdomen was prepped and draped in the usual sterile fashion. 1% lidocaine with epinephrine was used for local anesthesia. Following this, a 6 Fr Safe-T-Centesis catheter was introduced. An ultrasound image was saved for documentation purposes. The paracentesis was performed. The catheter was removed and a dressing was applied. The patient tolerated the procedure well without immediate post procedural complication. FINDINGS: Imaging of the left lobe of the liver demonstrates no significant dilated left hepatic ducts. A total of approximately 2.1 L of clear yellow fluid was  removed. IMPRESSION: The biliary drain  procedure into the left lobe of the liver was aborted. There were no suitable targets for entry as there were no significantly dilated bile ducts. Imaging over the right lobe of the liver demonstrates decompressed docs and multiple cystic areas. Successful ultrasound-guided paracentesis yielding 2.1 liters of peritoneal fluid. Electronically Signed   By: Marybelle Killings M.D.   On: 07/13/2018 15:33    Scheduled Meds: . feeding supplement (GLUCERNA SHAKE)  237 mL Oral TID BM  . ferrous sulfate  325 mg Oral BID WC  . insulin aspart  0-5 Units Subcutaneous QHS  . insulin aspart  0-9 Units Subcutaneous TID WC  . morphine  15 mg Oral Q12H  . multivitamin with minerals  1 tablet Oral Daily  . predniSONE  5 mg Oral Q breakfast    Admission status: Inpatient: Based on patients clinical presentation and evaluation of above clinical data, I have made determination that patient meets Inpatient criteria at this time.  Time spent: 20 min  [facility] Pager [phone]. If 7PM-7AM, please contact night-coverage at [email or web address], Office  [phone]  password [email or web address]  07/14/2018, 4:00 PM  LOS: 5 days

## 2018-07-14 NOTE — Progress Notes (Addendum)
IP PROGRESS NOTE  Subjective:   Crystal Carlson complains of abdominal pain this morning.  She requests IV Dilaudid.  She continues to feel drowsy.  Objective: Vital signs in last 24 hours: Blood pressure 118/88, pulse (!) 114, temperature 98.4 F (36.9 C), temperature source Oral, resp. rate 20, height _0  (1.651 m), weight 110 lb (49.9 kg), SpO2 97 %.  Intake/Output from previous day: 11/26 0701 - 11/27 0700 In: 340 [Blood:340] Out: -   Physical Exam:  HEENT: Scleral icterus, no thrush Lungs: Clear bilaterally, no respiratory distress Cardiac: Regular rate and rhythm Abdomen: Distended with ascites  Extremities: No leg edema Neurologic: Alert, follows commands  Portacath/PICC-without erythema or tenderness  Lab Results: Recent Labs    07/13/18 0541 07/14/18 0537  WBC 27.6* 28.4*  HGB 8.3* 8.4*  HCT 27.1* 27.4*  PLT 189 186    BMET Recent Labs    07/14/18 0537  NA 129*  K 3.9  CL 106  CO2 19*  GLUCOSE 111*  BUN 23*  CREATININE 0.64  CALCIUM 8.3*   AST 39, ALT 41, bilirubin 9.7, alk phosphatase 256 Medications: I have reviewed the patient's current medications.  Assessment/Plan:  1. 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 Adenocarcinoma consistent with pancreatobiliary primary.  Cycle 1 FOLFIRINOX 06/18/2017  Cycle 2FOLFIRINOX 07/02/2017  Cycle 3 FOLFIRINOX 07/16/2017 (oxaliplatin dose reduced due to mild thrombocytopenia)  CT 07/24/2017-new splenomegaly and left hydronephrosis  Cycle 4 FOLFIRINOX  08/08/2017  Cycle 5 FOLFIRINOX 08/24/2017  CT abdomen/pelvis 09/03/2017-stable liver lesions and pancreas mass, stable right biliary dilatation, possible new 8 mm right liver lesion versus a vascular phenomena, lesion at the right paracolic gutter was present in the past-significance unclear  Cycle 6 FOLFIRINOX1/29/2019  Cycle7 FOLFIRINOX2/19/2019 (Irinotecan andOxaliplatin dose reduced due to thrombocytopenia)  CT abdomen/pelvis 10/25/2017- findings concerning for enterocolitis. No bowel obstruction. Ill-defined pancreatic head mass. Multiple hepatic hypoenhancing lesions with the largest measuring 1.9 x 2.0 cm, previously 1.7 x 1.7 cm; 9 x 10 mm hypoenhancing lesion in the inferior right lobe of the liver slightly larger compared to the previous CT; 1.6 x 2.4 cm enhancing lesion in the right paracolic gutter increased in size compared to the prior CT.  Cycle 1 gemcitabine/Abraxane 11/04/2017  Cycle 2 gemcitabine/Abraxane 11/25/2017  Cycle 3 gemcitabine/Abraxane 12/08/2017  Cycle 4 gemcitabine 12/23/2017 (Abraxane held secondary to neuropathy)  CTs abdomen/pelvis 01/12/2018- decrease in size of the pancreatic mass and hepatic metastasis; slight interval decrease in size of right paracolic gutter implant; worsening abdominal/pelvic ascites. Bowel wall thickening likely due to ascites and low albumin. 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  Cycle 5 gemcitabine 01/13/2018 (Abraxane held secondary to neuropathy)  Cycle 6 gemcitabine/Abraxane 01/27/2018  CT abdomen/pelvis 02/01/2018- dominant metastases in the liver are unchanged, multiple new low-density lesions throughout the right lobe-small abscesses?, No change in pancreas mass  Cycle 7 gemcitabine/Abraxane 02/16/2018(gemcitabine and Abraxane dose reduced due to previous neutropenia)  Cycle 8 gemcitabine/Abraxane 03/03/2018  Cycle 9 gemcitabine/Abraxane 03/25/2018  Cycle 10 gemcitabine/Abraxane  04/14/2018  Restaging CTs 05/04/2018- similar ill-defined pancreatic mass; multiple liver metastases again noted, appears slightly improved; increase in volume of abdominal and pelvic ascites; diffuse  small bowel wall edema and mild edema of the ascending colon and transverse colon.  Cycle 11 gemcitabine/Abraxane 05/05/2018  Cycle 12 gemcitabine/Abraxane 06/02/2018  CT 06/16/2018-stable pancreas mass extending into retroperitoneal soft tissue, multiple low-density lesions in the right liver-increased in size probable superior mesenteric vein thrombosis, mild left hydronephrosis  Ultrasound aspiration of a liver lesion 06/22/2018-negative cytology and culture  MRI abdomen/MRCP on 07/12/2018- pancreatic head mass with vascular involvement and extension into the retroperitoneum with involvement of the aorta and IVC, multifocal liver lesions-some are increased felt to represent infected metastases or hepatic abscesses, progressive left intrahepatic biliary dilatation with mild biliary dilatation at the right liver   2. Pain secondary to #1.Improved.  3. Weight losssecondary to #1.  4. Microcytic anemia  Transfused with packed red blood cells 07/25/2017, 16 2019, and 11/27/2017  5. History of thrombocytopenia secondary to chemotherapy  6. Port-A-Cath placement 06/04/2017  7. Elevated liver enzymes/bilirubin10/22/2018-likely early biliary obstruction secondary to pancreas cancer, status post ERCP-placement of a common bile duct stent 06/09/2018  8.Postprandial abdominal pain. Protonix initiated 07/16/2017.Trial of Reglan initiated 08/24/2017.  9.Admission with fever 07/24/2017-no source for infection identified, resolved   10.Diarrheafollowing cycle 3 FOLFIRINOX-resolved  11.Left hydronephrosis noted on CT12/03/2017-etiology unclear, urology consulted-stent not recommended.Mild left hydronephrosis noted on CT 10/25/2017. Creatinine normal 10/27/2017.  12.  Admission 08/31/2017 with a high fever, blood culture positive for gram-negative rods-Klebsiella pneumoniae identified  13.Fever 10/25/2017 status post evaluation in the emergency department. CT with findings concerning for enterocolitis.   14. Admission 11/11/2017 with a high fever-blood cultures (peripheral and Port-A-Cath) positive for gram-negative rods--STENOTROPHOMONAS MALTOPHILIA;course of Septra completed as an outpatient.  15. Rash-potentially secondary to gemcitabine or infection, improved  16. Pancytopenia secondary to chemotherapy  17.Intermittent fever. Question "tumor fever", question infection. Now on Bactrim prophylaxis, improved  18. Ascites.Paracentesis 01/14/2018- reactive mesothelial cells, mixed inflammation;culture positive for Klebsiella and enterococcustreated withAugmentin  Admission with fever 01/30/2018, blood cultures negative, discharged on ciprofloxacin/Flagyl  Recurrent ascites, paracentesis 02/23/2018-culture positive for strep viridans, placed on Levaquin  Paracentesis 03/03/2018- 2 L of fluid removed, culture and cytology negative  Trialof Aldactone/Lasix started 05/26/2018  19.Status post evaluation back pain in the emergency department 04/23/2018-MRI lumbar spine with L5-S1 left subarticular disc extrusion  L5-S1laminotomyand microdiscectomy 05/17/2018, pain resolved  20. Admission 06/16/2018 with fever and failure to thrive- cultures negative, suspect liver abscesses 21.  Elevated liver enzymes and bilirubin 07/02/2018  ERCP 07/05/2018- migration of previously noted biliary stent with dilated biliary tree, new uncovered metal stent placed in the common bile duct 22.  Failure to thrive 07/08/2018- blood culture positive for pseudomonas aeruginosa, repeat blood cultures 07/09/2018-positive for pseudomonas aeruginosa, admitted 07/09/2018 and started on cefepime  Ms. Krempasky has metastatic pancreas cancer.  She is admitted with  Pseudomonas bacteremia felt to be secondary to biliary obstruction.  An MRI 07/12/2018 revealed persistent dilatation of the left intrahepatic bile ducts.  A transhepatic drain could not be placed yesterday.  No bile duct dilatation could be visualized on ultrasound.  I discussed the case with interventional radiology.  The cystic lesions in the liver are felt to represent bilomas.  There is no available intervention option for relieving the obstruction area I discussed the difficult situation with Crystal Carlson.  She cannot receive further chemotherapy unless the bilirubin improves and the infection resolves.  I suspect her pain is related to local progression of the pancreas mass.  I will discuss the case with Dr. Drucilla Schmidt regarding outpatient antibiotics and Port-A-Cath removal.  I discussed the case  with Dr. Darrick Meigs.  I will return to the floor this afternoon to discuss the prognosis with Ms. Suppa and her husband.  There is no apparent option for relieving the biliary obstruction.  I will recommend home hospice care.        LOS: 5 days   Betsy Coder, MD   07/14/2018, 12:23 PM  Addendum: I returned at approximately 2:30 PM for further discussion with Ms. Kaczynski and her husband.  I explained the poor prognosis and lack of options for treating the biliary obstruction.  She has an incurable malignancy with clinical suspicion of disease progression causing pain and biliary obstruction.  There is no oral option for treating the Pseudomonas.  I discussed the case with Dr. Drucilla Schmidt.  I recommend Hospice care.  Ms. Traughber is not ready to accept hospice care.  She would like to consider other treatment options for the pancreas cancer.  I offered her a second opinion including a possible transfer to [facility] or [facility].  She and her husband would like to discuss things further with their niece (a physician) who will be arriving tomorrow.  I will return for additional discussion on 07/15/2018.

## 2018-07-15 LAB — GLUCOSE, CAPILLARY: GLUCOSE-CAPILLARY: 111 mg/dL — AB (ref 70–99)

## 2018-07-15 MED ORDER — SPIRONOLACTONE 100 MG PO TABS
100.0000 mg | ORAL_TABLET | Freq: Every day | ORAL | Status: DC
  Administered 2018-07-15 – 2018-07-16 (×2): 100 mg via ORAL
  Filled 2018-07-15 (×2): qty 1

## 2018-07-15 MED ORDER — FUROSEMIDE 40 MG PO TABS
40.0000 mg | ORAL_TABLET | Freq: Every day | ORAL | Status: DC
  Administered 2018-07-15 – 2018-07-16 (×2): 40 mg via ORAL
  Filled 2018-07-15 (×2): qty 1

## 2018-07-15 NOTE — Progress Notes (Signed)
Crystal Carlson asked Probation officer for IV dilaudid for pain. Her husband told  Probation officer "she is not in pain." Writer explained to her   husband that I have to abide by her wishes . He stated "she is not hurting"

## 2018-07-15 NOTE — Progress Notes (Signed)
[facility]  PROGRESS NOTE  Elica Almas XFG:[id number] DOB: 1965/10/17 DOA: 07/09/2018 PCP: Mack Hook, MD   Brief HPI:   52 year old female with a history of stage IV metastatic pancreatic cancer on chemotherapy, chronic anemia who was sent to the ED after blood cultures done as outpatient was positive for Pseudomonas.  She was admitted here 5 weeks ago with fever and chills for worsening metastatic disease and was found to have possible liver abscess and was discharged oral antibiotics.  ID and oncology has been following patient for Pseudomonas bacteremia.    Subjective   Patient seen and examined, has declined hospice. Her niece who is a physician, is coming from [place]. She will discuss with Dr Orpah Clinton.   Assessment/Plan:     1. Pseudomonas bacteremia-outpatient blood cultures grew Pseudomonas.  Patient started on cefepime.  Repeat blood cultures from 07/11/2018 are negative.  Patient has Port-A-Cath in place, continue with cefepime.  ID following.  2. Leukocytosis-WBC worsening, 28,000.  Continue cefepime as above.  3. Stage IV pancreatic cancer-followed by oncology as outpatient.  She was on chemotherapy.  Recently had biliary stent exchange by GI for metastatic pancreatic cancer on 07/05/2018.  ID consulted GI, [facility] recommended MRCP to check for stent position.  MRCP shows infected metastasis or hepatic abscesses.  IR consulted for biliary drain/possible paracentesis.  GI has signed off.  Patient not ready for hospice.  Oncologist Dr. Malachy Mood  has discussed with family regarding second opinion. . Her niece who is a physician, is coming from [place]. She will discuss with Dr Orpah Clinton.  4. Elevated INR- INR was 1.7 , 1 unit of FFP given for IR procedure as I have needed to be less than 1.5.  5. Severe protein calorie malnutrition-nutrition consulted  6. Hyponatremia-improved with IV fluids.  Today sodium 131.  7. Diabetes mellitus type 2-continue sliding scale insulin  with NovoLog, CBG stable.      CBG: Recent Labs  Lab 07/14/18 0837 07/14/18 1151 07/14/18 1703 07/14/18 2108 07/15/18 0800  GLUCAP 108* 102* 169* 139* 111*    CBC: Recent Labs  Lab 07/09/18 1717 07/10/18 0600 07/11/18 0501 07/13/18 0541 07/14/18 0537  WBC 25.6* 20.8* 18.1* 27.6* 28.4*  NEUTROABS 23.3*  --  14.9*  --   --   HGB 9.5* 8.0* 8.6* 8.3* 8.4*  HCT 30.8* 25.6* 28.4* 27.1* 27.4*  MCV 82.4 83.4 85.0 84.7 86.4  PLT 228 176 201 189 967    Basic Metabolic Panel: Recent Labs  Lab 07/09/18 1717 07/10/18 0600 07/11/18 0501 07/14/18 0537  NA 123* 126* 131* 129*  K 5.0 4.5 4.2 3.9  CL 88* 94* 102 106  CO2 25 25 21* 19*  GLUCOSE 204* 139* 90 111*  BUN 37* 31* 23* 23*  CREATININE 0.87 0.71 0.65 0.64  CALCIUM 8.4* 8.1* 8.3* 8.3*  MG 2.4  --   --   --      DVT prophylaxis: Heparin  Code Status: Full code  Family Communication: Discussed with family member at bedside  Disposition Plan: likely home when medically ready for discharge   Consultants:  ID  Oncology  Procedures: None   Antibiotics:   Anti-infectives (From admission, onward)   Start     Dose/Rate Route Frequency Ordered Stop   07/10/18 1400  ceFEPIme (MAXIPIME) 2 g in sodium chloride 0.9 % 100 mL IVPB     2 g 200 mL/hr over 30 Minutes Intravenous Every 8 hours 07/10/18 1344     07/09/18 1730  ceFEPIme (  MAXIPIME) 2 g in sodium chloride 0.9 % 100 mL IVPB  Status:  Discontinued     2 g 200 mL/hr over 30 Minutes Intravenous Every 12 hours 07/09/18 1649 07/10/18 1344       Objective   Vitals:   07/14/18 1150 07/14/18 1341 07/14/18 2111 07/15/18 0536  BP:  118/87 117/84 (!) 124/95  Pulse:  97 (!) 101 (!) 117  Resp:  19 20 20   Temp:  98.1 F (36.7 C) 99.5 F (37.5 C) 98.6 F (37 C)  TempSrc:  Oral Oral Oral  SpO2: 97% 97% 99% 97%  Weight:    51.4 kg  Height:       No intake or output data in the 24 hours ending 07/15/18 1344 Filed Weights   07/13/18 0521 07/14/18 0611  07/15/18 0536  Weight: 55.9 kg 49.9 kg 51.4 kg     Physical Examination:     General:  Appears in no acute distress  Cardiovascular: S1S2 RRR  Respiratory: Clear bilaterally  Abdomen: Soft, nontender, distended  Musculoskeletal: no edema of the lower extremities        Data Reviewed: I have personally reviewed following labs and imaging studies   Recent Results (from the past 240 hour(s))  Culture, Blood     Status: Abnormal   Collection Time: 07/08/18 11:12 AM  Result Value Ref Range Status   Specimen Description BLOOD PORTA CATH RIGHT  Final   Special Requests   Final    BOTTLES DRAWN AEROBIC AND ANAEROBIC Blood Culture adequate volume   Culture  Setup Time   Final    GRAM NEGATIVE RODS IN BOTH AEROBIC AND ANAEROBIC BOTTLES CRITICAL RESULT CALLED TO, READ BACK BY AND VERIFIED WITH: S CARDWELL RN [phone] BY GF Performed at [facility], [place] [address] [place], [place] [address]    Culture PSEUDOMONAS AERUGINOSA (A)  Final   Report Status 07/11/2018 FINAL  Final   Organism ID, Bacteria PSEUDOMONAS AERUGINOSA  Final      Susceptibility   Pseudomonas aeruginosa - MIC*    CEFTAZIDIME <=1 SENSITIVE Sensitive     CIPROFLOXACIN >=4 RESISTANT Resistant     GENTAMICIN <=1 SENSITIVE Sensitive     IMIPENEM 1 SENSITIVE Sensitive     PIP/TAZO <=4 SENSITIVE Sensitive     CEFEPIME <=1 SENSITIVE Sensitive     * PSEUDOMONAS AERUGINOSA  Blood Culture ID Panel (Reflexed)     Status: Abnormal   Collection Time: 07/08/18 11:12 AM  Result Value Ref Range Status   Enterococcus species NOT DETECTED NOT DETECTED Final   Listeria monocytogenes NOT DETECTED NOT DETECTED Final   Staphylococcus species NOT DETECTED NOT DETECTED Final   Staphylococcus aureus (BCID) NOT DETECTED NOT DETECTED Final   Streptococcus species NOT DETECTED NOT DETECTED Final   Streptococcus agalactiae NOT DETECTED NOT DETECTED Final   Streptococcus pneumoniae NOT DETECTED NOT DETECTED Final    Streptococcus pyogenes NOT DETECTED NOT DETECTED Final   Acinetobacter baumannii NOT DETECTED NOT DETECTED Final   Enterobacteriaceae species NOT DETECTED NOT DETECTED Final   Enterobacter cloacae complex NOT DETECTED NOT DETECTED Final   Escherichia coli NOT DETECTED NOT DETECTED Final   Klebsiella oxytoca NOT DETECTED NOT DETECTED Final   Klebsiella pneumoniae NOT DETECTED NOT DETECTED Final   Proteus species NOT DETECTED NOT DETECTED Final   Serratia marcescens NOT DETECTED NOT DETECTED Final   Carbapenem resistance NOT DETECTED NOT DETECTED Final   Haemophilus influenzae NOT DETECTED NOT DETECTED Final   Neisseria  meningitidis NOT DETECTED NOT DETECTED Final   Pseudomonas aeruginosa DETECTED (A) NOT DETECTED Final    Comment: CRITICAL RESULT CALLED TO, READ BACK BY AND VERIFIED WITH: S CAEDWELL RN [phone] BY GF    Candida albicans NOT DETECTED NOT DETECTED Final   Candida glabrata NOT DETECTED NOT DETECTED Final   Candida krusei NOT DETECTED NOT DETECTED Final   Candida parapsilosis NOT DETECTED NOT DETECTED Final   Candida tropicalis NOT DETECTED NOT DETECTED Final    Comment: Performed at [facility], [place] [address], [place], [place] [address]  Culture, blood (Routine X 2) w Reflex to ID Panel     Status: Abnormal   Collection Time: 07/09/18  5:18 PM  Result Value Ref Range Status   Specimen Description   Final    BLOOD BLOOD LEFT ARM Performed at [facility] [address], [place], [place] [address]    Special Requests   Final    BOTTLES DRAWN AEROBIC AND ANAEROBIC Blood Culture adequate volume Performed at [facility] [address], [place], [place] [address]    Culture  Setup Time   Final    GRAM NEGATIVE RODS AEROBIC BOTTLE ONLY CRITICAL VALUE NOTED.  VALUE IS CONSISTENT WITH PREVIOUSLY REPORTED AND CALLED VALUE.    Culture (A)  Final    PSEUDOMONAS AERUGINOSA SUSCEPTIBILITIES PERFORMED ON PREVIOUS CULTURE WITHIN  THE LAST 5 DAYS. Performed at [facility], [place] [address], [place], [place] [address]    Report Status 07/12/2018 FINAL  Final  Culture, blood (Routine X 2) w Reflex to ID Panel     Status: Abnormal   Collection Time: 07/09/18  6:19 PM  Result Value Ref Range Status   Specimen Description   Final    BLOOD PORTA CATH Performed at [facility] [address], [place], [place] [address]    Special Requests   Final    BOTTLES DRAWN AEROBIC AND ANAEROBIC Blood Culture adequate volume Performed at [facility] [address] [place], [place] [address]    Culture  Setup Time   Final    GRAM NEGATIVE RODS IN BOTH AEROBIC AND ANAEROBIC BOTTLES CRITICAL VALUE NOTED.  VALUE IS CONSISTENT WITH PREVIOUSLY REPORTED AND CALLED VALUE.    Culture (A)  Final    PSEUDOMONAS AERUGINOSA SUSCEPTIBILITIES PERFORMED ON PREVIOUS CULTURE WITHIN THE LAST 5 DAYS. Performed at [facility], [place] [address] [place], [place] [address]    Report Status 07/12/2018 FINAL  Final  Culture, blood (Routine X 2) w Reflex to ID Panel     Status: None (Preliminary result)   Collection Time: 07/11/18  8:00 AM  Result Value Ref Range Status   Specimen Description   Final    BLOOD RIGHT ANTECUBITAL Performed at [facility] [address], [place], [place] [address]    Special Requests   Final    BOTTLES DRAWN AEROBIC ONLY Blood Culture adequate volume Performed at [facility] [address] [place], [place] [address]    Culture   Final    NO GROWTH 4 DAYS Performed at [facility], [place] [address], [place], [place] [address]    Report Status PENDING  Incomplete     Liver Function Tests: Recent Labs  Lab 07/09/18 1717 07/10/18 0600 07/11/18 0501 07/14/18 0537  AST 38 29 39 39  ALT 73* 56* 50* 41  ALKPHOS 258* 212* 237* 256*  BILITOT 9.0* 7.2* 7.5* 9.7*  PROT 6.9 5.8* 5.8* 6.5  ALBUMIN 2.0* 1.8* 1.7* 1.7*    No results for input(s): LIPASE, AMYLASE in the last 168 hours. Recent Labs  Lab 07/09/18 1722  AMMONIA 55*    Cardiac Enzymes: No results for input(s): CKTOTAL, CKMB, CKMBINDEX, TROPONINI in the last 168 hours. BNP (last 3 results) No results for input(s): BNP in the last 8760 hours.  ProBNP (last 3 results) No results for input(s): PROBNP in the last 8760 hours.    Studies: Ir Abdomen US Limited  Result Date: 07/13/2018 INDICATION: Biliary obstruction.  Ascites. EXAM: ULTRASOUND GUIDED PARACENTESIS, LIMITED ABDOMINAL ULTRASOUND, ABORTED PERCUTANEOUS TRANSHEPATIC CHOLANGIOGRAM AND BILIARY DRAIN MEDICATIONS: None. COMPLICATIONS: None immediate. PROCEDURE: Informed written consent was obtained from the patient after a discussion of the risks, benefits and alternatives to treatment. A timeout was performed prior to the initiation of the procedure. Imaging of the upper abdomen at the location of the left liver demonstrates no significant dilated left hepatic ducts. The left lobe of the liver is very atrophic. External biliary drain procedure was aborted. Initial ultrasound scanning demonstrates a large amount of ascites within the right lower abdominal quadrant. The right lower abdomen was prepped and draped in the usual sterile fashion. 1% lidocaine with epinephrine was used for local anesthesia. Following this, a 6 Fr Safe-T-Centesis catheter was introduced. An ultrasound image was saved for documentation purposes. The paracentesis was performed. The catheter was removed and a dressing was applied. The patient tolerated the procedure well without immediate post procedural complication. FINDINGS: Imaging of the left lobe of the liver demonstrates no significant dilated left hepatic ducts. A total of approximately 2.1 L of clear yellow fluid was removed. IMPRESSION: The biliary drain procedure into the left lobe of the liver was aborted. There were no suitable targets for entry as there were no  significantly dilated bile ducts. Imaging over the right lobe of the liver demonstrates decompressed docs and multiple cystic areas. Successful ultrasound-guided paracentesis yielding 2.1 liters of peritoneal fluid. Electronically Signed   By: Marybelle Killings M.D.   On: 07/13/2018 15:33   Ir Paracentesis  Result Date: 07/13/2018 INDICATION: Biliary obstruction.  Ascites. EXAM: ULTRASOUND GUIDED PARACENTESIS, LIMITED ABDOMINAL ULTRASOUND, ABORTED PERCUTANEOUS TRANSHEPATIC CHOLANGIOGRAM AND BILIARY DRAIN MEDICATIONS: None. COMPLICATIONS: None immediate. PROCEDURE: Informed written consent was obtained from the patient after a discussion of the risks, benefits and alternatives to treatment. A timeout was performed prior to the initiation of the procedure. Imaging of the upper abdomen at the location of the left liver demonstrates no significant dilated left hepatic ducts. The left lobe of the liver is very atrophic. External biliary drain procedure was aborted. Initial ultrasound scanning demonstrates a large amount of ascites within the right lower abdominal quadrant. The right lower abdomen was prepped and draped in the usual sterile fashion. 1% lidocaine with epinephrine was used for local anesthesia. Following this, a 6 Fr Safe-T-Centesis catheter was introduced. An ultrasound image was saved for documentation purposes. The paracentesis was performed. The catheter was removed and a dressing was applied. The patient tolerated the procedure well without immediate post procedural complication. FINDINGS: Imaging of the left lobe of the liver demonstrates no significant dilated left hepatic ducts. A total of approximately 2.1 L of clear yellow fluid was removed. IMPRESSION: The biliary drain procedure into the left lobe of the liver was aborted. There were no suitable targets for entry as there were no significantly dilated bile ducts. Imaging over the right lobe of  the liver demonstrates decompressed docs and  multiple cystic areas. Successful ultrasound-guided paracentesis yielding 2.1 liters of peritoneal fluid. Electronically Signed   By: Marybelle Killings M.D.   On: 07/13/2018 15:33    Scheduled Meds: . feeding supplement (GLUCERNA SHAKE)  237 mL Oral TID BM  . ferrous sulfate  325 mg Oral BID WC  . furosemide  40 mg Oral Daily  . morphine  15 mg Oral Q12H  . multivitamin with minerals  1 tablet Oral Daily  . predniSONE  5 mg Oral Q breakfast  . spironolactone  100 mg Oral Daily    Admission status: Inpatient: Based on patients clinical presentation and evaluation of above clinical data, I have made determination that patient meets Inpatient criteria at this time.  Time spent: 20 min  [facility] Pager [phone]. If 7PM-7AM, please contact night-coverage at [email or web address], Office  [phone]  password [email or web address]  07/15/2018, 1:44 PM  LOS: 6 days

## 2018-07-15 NOTE — Progress Notes (Addendum)
IP PROGRESS NOTE  Subjective:   Crystal Carlson reports feeling weak.  No pain this morning.  Family is at the bedside. Objective: Vital signs in last 24 hours: Blood pressure (!) 124/95, pulse (!) 117, temperature 98.6 F (37 C), temperature source Oral, resp. rate 20, height 5\' 5"  (1.651 m), weight 113 lb 6.4 oz (51.4 kg), SpO2 97 %.  Intake/Output from previous day: 11/27 0701 - 11/28 0700 In: 400 [IV Piggyback:400] Out: -   Physical Exam:  HEENT: Scleral icterus, no thrush Lungs: End inspiratory rales at the right posterior base, no respiratory distress Cardiac: Regular rate and rhythm, tachycardia Abdomen: Distended with ascites  Extremities: No leg edema Neurologic: Alert, follows commands  Portacath/PICC-without erythema or tenderness  Lab Results: Recent Labs    07/13/18 0541 07/14/18 0537  WBC 27.6* 28.4*  HGB 8.3* 8.4*  HCT 27.1* 27.4*  PLT 189 186    BMET Recent Labs    07/14/18 0537  NA 129*  K 3.9  CL 106  CO2 19*  GLUCOSE 111*  BUN 23*  CREATININE 0.64  CALCIUM 8.3*    Medications: I have reviewed the patient's current medications.  Assessment/Plan:  1. 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 Adenocarcinoma consistent with pancreatobiliary primary.  Cycle 1 FOLFIRINOX 06/18/2017  Cycle 2FOLFIRINOX 07/02/2017  Cycle 3 FOLFIRINOX 07/16/2017 (oxaliplatin dose reduced due to mild thrombocytopenia)  CT 07/24/2017-new splenomegaly and left hydronephrosis  Cycle 4 FOLFIRINOX 08/08/2017  Cycle 5 FOLFIRINOX  08/24/2017  CT abdomen/pelvis 09/03/2017-stable liver lesions and pancreas mass, stable right biliary dilatation, possible new 8 mm right liver lesion versus a vascular phenomena, lesion at the right paracolic gutter was present in the past-significance unclear  Cycle 6 FOLFIRINOX1/29/2019  Cycle7 FOLFIRINOX2/19/2019 (Irinotecan andOxaliplatin dose reduced due to thrombocytopenia)  CT abdomen/pelvis 10/25/2017- findings concerning for enterocolitis. No bowel obstruction. Ill-defined pancreatic head mass. Multiple hepatic hypoenhancing lesions with the largest measuring 1.9 x 2.0 cm, previously 1.7 x 1.7 cm; 9 x 10 mm hypoenhancing lesion in the inferior right lobe of the liver slightly larger compared to the previous CT; 1.6 x 2.4 cm enhancing lesion in the right paracolic gutter increased in size compared to the prior CT.  Cycle 1 gemcitabine/Abraxane 11/04/2017  Cycle 2 gemcitabine/Abraxane 11/25/2017  Cycle 3 gemcitabine/Abraxane 12/08/2017  Cycle 4 gemcitabine 12/23/2017 (Abraxane held secondary to neuropathy)  CTs abdomen/pelvis 01/12/2018- decrease in size of the pancreatic mass and hepatic metastasis; slight interval decrease in size of right paracolic gutter implant; worsening abdominal/pelvic ascites. Bowel wall thickening likely due to ascites and low albumin. 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  Cycle 5 gemcitabine 01/13/2018 (Abraxane held secondary to neuropathy)  Cycle 6 gemcitabine/Abraxane 01/27/2018  CT abdomen/pelvis 02/01/2018- dominant metastases in the liver are unchanged, multiple new low-density lesions throughout the right lobe-small abscesses?, No change in pancreas mass  Cycle 7 gemcitabine/Abraxane 02/16/2018(gemcitabine and Abraxane dose reduced due to previous neutropenia)  Cycle 8 gemcitabine/Abraxane 03/03/2018  Cycle 9 gemcitabine/Abraxane 03/25/2018  Cycle 10 gemcitabine/Abraxane 04/14/2018  Restaging CTs 05/04/2018-  similar ill-defined pancreatic mass; multiple liver metastases again noted, appears slightly improved; increase in volume of abdominal and pelvic ascites; diffuse small  bowel wall edema and mild edema of the ascending colon and transverse colon.  Cycle 11 gemcitabine/Abraxane 05/05/2018  Cycle 12 gemcitabine/Abraxane 06/02/2018  CT 06/16/2018-stable pancreas mass extending into retroperitoneal soft tissue, multiple low-density lesions in the right liver-increased in size probable superior mesenteric vein thrombosis, mild left hydronephrosis  Ultrasound aspiration of a liver lesion 06/22/2018-negative cytology and culture  MRI abdomen/MRCP on 07/12/2018- pancreatic head mass with vascular involvement and extension into the retroperitoneum with involvement of the aorta and IVC, multifocal liver lesions-some are increased felt to represent infected metastases or hepatic abscesses, progressive left intrahepatic biliary dilatation with mild biliary dilatation at the right liver   2. Pain secondary to #1.Improved.  3. Weight losssecondary to #1.  4. Microcytic anemia  Transfused with packed red blood cells 07/25/2017, 16 2019, and 11/27/2017  5. History of thrombocytopenia secondary to chemotherapy  6. Port-A-Cath placement 06/04/2017  7. Elevated liver enzymes/bilirubin10/22/2018-likely early biliary obstruction secondary to pancreas cancer, status post ERCP-placement of a common bile duct stent 06/09/2018  8.Postprandial abdominal pain. Protonix initiated 07/16/2017.Trial of Reglan initiated 08/24/2017.  9.Admission with fever 07/24/2017-no source for infection identified, resolved   10.Diarrheafollowing cycle 3 FOLFIRINOX-resolved  11.Left hydronephrosis noted on CT12/03/2017-etiology unclear, urology consulted-stent not recommended.Mild left hydronephrosis noted on CT 10/25/2017. Creatinine normal 10/27/2017.  12. Admission 08/31/2017 with a high fever,  blood culture positive for gram-negative rods-Klebsiella pneumoniae identified  13.Fever 10/25/2017 status post evaluation in the emergency department. CT with findings concerning for enterocolitis.   14. Admission 11/11/2017 with a high fever-blood cultures (peripheral and Port-A-Cath) positive for gram-negative rods--STENOTROPHOMONAS MALTOPHILIA;course of Septra completed as an outpatient.  15. Rash-potentially secondary to gemcitabine or infection, improved  16. Pancytopenia secondary to chemotherapy  17.Intermittent fever. Question "tumor fever", question infection. Now on Bactrim prophylaxis, improved  18. Ascites.Paracentesis 01/14/2018- reactive mesothelial cells, mixed inflammation;culture positive for Klebsiella and enterococcustreated withAugmentin  Admission with fever 01/30/2018, blood cultures negative, discharged on ciprofloxacin/Flagyl  Recurrent ascites, paracentesis 02/23/2018-culture positive for strep viridans, placed on Levaquin  Paracentesis 03/03/2018- 2 L of fluid removed, culture and cytology negative  Trialof Aldactone/Lasix started 05/26/2018  19.Status post evaluation back pain in the emergency department 04/23/2018-MRI lumbar spine with L5-S1 left subarticular disc extrusion  L5-S1laminotomyand microdiscectomy 05/17/2018, pain resolved  20. Admission 06/16/2018 with fever and failure to thrive- cultures negative, suspect liver abscesses 21.  Elevated liver enzymes and bilirubin 07/02/2018  ERCP 07/05/2018- migration of previously noted biliary stent with dilated biliary tree, new uncovered metal stent placed in the common bile duct 22.  Failure to thrive 07/08/2018- blood culture positive for pseudomonas aeruginosa, repeat blood cultures 07/09/2018-positive for pseudomonas aeruginosa, admitted 07/09/2018 and started on cefepime  Crystal Carlson appears unchanged.  She has metastatic pancreas cancer.  She is admitted with Pseudomonas  bacteremia.  Her performance status is poor.  I discussed treatment options with Crystal Carlson and her family again this morning.  She is interested in seeking a second opinion and clinical trials.  I explained she will not be a candidate for a clinical trial based on her performance status and hyperbilirubinemia.  Her niece, a physician, will be here to meet with me in the a.m. on 07/16/2018. I recommend hospice care.  If she declines hospice we can arrange for home IV antibiotics and a second opinion.  I will discontinue the CBGs.  She would like to resume Aldactone and Lasix in an attempt to lessen the ascites.      LOS: 6 days   Betsy Coder, MD  07/15/2018, 8:27 AM

## 2018-07-16 ENCOUNTER — Encounter: Payer: Self-pay | Admitting: *Deleted

## 2018-07-16 LAB — COMPREHENSIVE METABOLIC PANEL
ALBUMIN: 1.6 g/dL — AB (ref 3.5–5.0)
ALK PHOS: 257 U/L — AB (ref 38–126)
ALT: 36 U/L (ref 0–44)
ANION GAP: 7 (ref 5–15)
AST: 38 U/L (ref 15–41)
BILIRUBIN TOTAL: 7.9 mg/dL — AB (ref 0.3–1.2)
BUN: 35 mg/dL — ABNORMAL HIGH (ref 6–20)
CALCIUM: 8.5 mg/dL — AB (ref 8.9–10.3)
CO2: 16 mmol/L — ABNORMAL LOW (ref 22–32)
Chloride: 105 mmol/L (ref 98–111)
Creatinine, Ser: 1.02 mg/dL — ABNORMAL HIGH (ref 0.44–1.00)
GLUCOSE: 124 mg/dL — AB (ref 70–99)
Potassium: 3.9 mmol/L (ref 3.5–5.1)
Sodium: 128 mmol/L — ABNORMAL LOW (ref 135–145)
TOTAL PROTEIN: 6.3 g/dL — AB (ref 6.5–8.1)

## 2018-07-16 LAB — CULTURE, BLOOD (ROUTINE X 2)
Culture: NO GROWTH
SPECIAL REQUESTS: ADEQUATE

## 2018-07-16 LAB — CREATININE, SERUM
CREATININE: 1.01 mg/dL — AB (ref 0.44–1.00)
GFR calc Af Amer: >60 mL/min (ref >60)
GFR calc non Af Amer: >60 mL/min (ref >60)

## 2018-07-16 MED ORDER — CEFEPIME IV (FOR PTA / DISCHARGE USE ONLY): 2.0000 g | Freq: Two times a day (BID) | INTRAVENOUS | 0 refills | Status: AC

## 2018-07-16 MED ORDER — MORPHINE SULFATE ER 15 MG PO TBCR: 15.0000 mg | EXTENDED_RELEASE_TABLET | Freq: Two times a day (BID) | ORAL | 0 refills | Status: AC

## 2018-07-16 MED ORDER — HEPARIN SOD (PORK) LOCK FLUSH 100 UNIT/ML IV SOLN: 500.0000 [IU] | INTRAVENOUS | Status: DC | PRN

## 2018-07-16 MED ORDER — SPIRONOLACTONE 100 MG PO TABS: 100.0000 mg | ORAL_TABLET | Freq: Every day | ORAL | 2 refills | Status: AC

## 2018-07-16 MED ORDER — HYDROMORPHONE HCL 4 MG PO TABS: 4.0000 mg | ORAL_TABLET | ORAL | 0 refills | Status: AC | PRN

## 2018-07-16 MED ORDER — ONDANSETRON HCL 4 MG PO TABS: 4.0000 mg | ORAL_TABLET | Freq: Four times a day (QID) | ORAL | 0 refills | Status: AC | PRN

## 2018-07-16 MED ORDER — POLYETHYLENE GLYCOL 3350 17 G PO PACK: 17.0000 g | PACK | Freq: Every day | ORAL | 0 refills | Status: AC | PRN

## 2018-07-16 MED ORDER — SODIUM CHLORIDE 0.9 % IV SOLN: Freq: Once | INTRAVENOUS | Status: DC

## 2018-07-16 MED ORDER — FUROSEMIDE 40 MG PO TABS: 40.0000 mg | ORAL_TABLET | Freq: Every day | ORAL | 1 refills | Status: AC

## 2018-07-16 MED ORDER — SODIUM CHLORIDE 0.9 % IV SOLN: 2.0000 g | Freq: Two times a day (BID) | INTRAVENOUS | Status: DC

## 2018-07-16 MED FILL — ONDANSETRON HCL 4 MG TABLET: 4 | 5 days supply | Qty: 20 | Fill #0

## 2018-07-16 MED FILL — HEALTHYLAX PACK: 14 days supply | Qty: 14 | Fill #0

## 2018-07-16 MED FILL — MORPHINE SULF ER 15 MG TAB: 15 | 15 days supply | Qty: 30 | Fill #0

## 2018-07-16 MED FILL — HYDROmorphone HCL 4 MG TABS: 4 | 5 days supply | Qty: 60 | Fill #0

## 2018-07-16 NOTE — Progress Notes (Signed)
Pharmacy Antibiotic Note  Crystal Carlson is a 52 y.o. female with metastatic pancreatic cancer on chemotherapy admitted on 07/09/2018 with Pseudomonal bacteremia.  Pharmacy has been consulted for cefepime dosing.  Today, 07/16/18  Day #7 Abxs  Afebrile  SCr 1.01 up CrCl 56  Repeat blood cultures 11/24 - no growth final  Plan: - Continue Cefepime to 2g IV q8h for now - Recheck SCr tomorrow - if continues to worsen will need to adjust cefepime dosing to 2g IV q12  Height: 5\' 5"  (165.1 cm) Weight: 117 lb 9.6 oz (53.3 kg) IBW/kg (Calculated) : 57  Temp (24hrs), Avg:98.4 F (36.9 C), Min:98.1 F (36.7 C), Max:98.8 F (37.1 C)  Recent Labs  Lab 07/09/18 1717 07/10/18 0600 07/11/18 0501 07/13/18 0541 07/14/18 0537 07/16/18 0528  WBC 25.6* 20.8* 18.1* 27.6* 28.4*  --   CREATININE 0.87 0.71 0.65  --  0.64 1.01*    Estimated Creatinine Clearance: 56.1 mL/min (A) (by C-G formula based on SCr of 1.01 mg/dL (H)).    Allergies  Allergen Reactions  . Pork-Derived Products Other (See Comments)    RELIGIOUS BELIEF -  NO PORK    Antimicrobials this admission: 11/22 cefepime >>  Microbiology results: Outpatient at [facility]:  11/21 BCx from port-a-cath: GNR in aerobic and anaerobic bottles, BCID = Pseudomonas aeruginosa  This admission 11/22 BCx (port-a-cath): Pseudomonas 11/22 BCx (peripheral): Pseudomonas 11/24 BCx: NGF  Thank you for allowing pharmacy to be a part of this patient's care.  Adrian Saran, PharmD, BCPS Pager [phone] 07/16/2018 7:40 AM

## 2018-07-16 NOTE — Progress Notes (Signed)
IP PROGRESS NOTE  Subjective:   Crystal Carlson continues to feel weak.  She has been out of bed, but she is not ambulating.  Multiple family members are at the bedside. Objective: Vital signs in last 24 hours: Blood pressure (!) 133/91, pulse (!) 111, temperature 98.8 F (37.1 C), temperature source Oral, resp. rate 12, height '5\' 5"'$  (1.651 m), weight 117 lb 9.6 oz (53.3 kg), SpO2 97 %.  Intake/Output from previous day: No intake/output data recorded.  Physical Exam:  HEENT: Scleral icterus Lungs: Fair anteriorly, no respiratory distress Cardiac: Regular rate and rhythm, tachycardia Abdomen: Distended with ascites  Extremities: No leg edema Neurologic: Alert, follows commands  Portacath/PICC-without erythema or tenderness  Lab Results: Recent Labs    07/14/18 0537  WBC 28.4*  HGB 8.4*  HCT 27.4*  PLT 186    BMET Recent Labs    07/14/18 0537 07/16/18 0528  NA 129*  --   K 3.9  --   CL 106  --   CO2 19*  --   GLUCOSE 111*  --   BUN 23*  --   CREATININE 0.64 1.01*  CALCIUM 8.3*  --     Medications: I have reviewed the patient's current medications.  Assessment/Plan:  1. 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 Adenocarcinoma consistent with pancreatobiliary primary.  Cycle 1 FOLFIRINOX 06/18/2017  Cycle 2FOLFIRINOX 07/02/2017  Cycle 3 FOLFIRINOX 07/16/2017 (oxaliplatin dose reduced due to mild thrombocytopenia)  CT 07/24/2017-new splenomegaly and left hydronephrosis  Cycle 4 FOLFIRINOX 08/08/2017  Cycle 5 FOLFIRINOX  08/24/2017  CT abdomen/pelvis 09/03/2017-stable liver lesions and pancreas mass, stable right biliary dilatation, possible new 8 mm right liver lesion versus a vascular phenomena, lesion at the right paracolic gutter was present in the past-significance unclear  Cycle 6 FOLFIRINOX1/29/2019  Cycle7 FOLFIRINOX2/19/2019 (Irinotecan andOxaliplatin dose reduced due to thrombocytopenia)  CT abdomen/pelvis 10/25/2017- findings concerning for enterocolitis. No bowel obstruction. Ill-defined pancreatic head mass. Multiple hepatic hypoenhancing lesions with the largest measuring 1.9 x 2.0 cm, previously 1.7 x 1.7 cm; 9 x 10 mm hypoenhancing lesion in the inferior right lobe of the liver slightly larger compared to the previous CT; 1.6 x 2.4 cm enhancing lesion in the right paracolic gutter increased in size compared to the prior CT.  Cycle 1 gemcitabine/Abraxane 11/04/2017  Cycle 2 gemcitabine/Abraxane 11/25/2017  Cycle 3 gemcitabine/Abraxane 12/08/2017  Cycle 4 gemcitabine 12/23/2017 (Abraxane held secondary to neuropathy)  CTs abdomen/pelvis 01/12/2018- decrease in size of the pancreatic mass and hepatic metastasis; slight interval decrease in size of right paracolic gutter implant; worsening abdominal/pelvic ascites. Bowel wall thickening likely due to ascites and low albumin. 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  Cycle 5 gemcitabine 01/13/2018 (Abraxane held secondary to neuropathy)  Cycle 6 gemcitabine/Abraxane 01/27/2018  CT abdomen/pelvis 02/01/2018- dominant metastases in the liver are unchanged, multiple new low-density lesions throughout the right lobe-small abscesses?, No change in pancreas mass  Cycle 7 gemcitabine/Abraxane 02/16/2018(gemcitabine and Abraxane dose reduced due to previous neutropenia)  Cycle 8 gemcitabine/Abraxane 03/03/2018  Cycle 9 gemcitabine/Abraxane 03/25/2018  Cycle 10 gemcitabine/Abraxane 04/14/2018  Restaging CTs 05/04/2018-  similar ill-defined pancreatic mass; multiple liver metastases again noted, appears  slightly improved; increase in volume of abdominal and pelvic ascites; diffuse small bowel wall edema and mild edema of the ascending colon and transverse colon.  Cycle 11 gemcitabine/Abraxane 05/05/2018  Cycle 12 gemcitabine/Abraxane 06/02/2018  CT 06/16/2018-stable pancreas mass extending into retroperitoneal soft tissue, multiple low-density lesions in the right liver-increased in size probable superior mesenteric vein thrombosis, mild left hydronephrosis  Ultrasound aspiration of a liver lesion 06/22/2018-negative cytology and culture  MRI abdomen/MRCP on 07/12/2018- pancreatic head mass with vascular involvement and extension into the retroperitoneum with involvement of the aorta and IVC, multifocal liver lesions-some are increased felt to represent infected metastases or hepatic abscesses, progressive left intrahepatic biliary dilatation with mild biliary dilatation at the right liver   2. Pain secondary to #1.Improved.  3. Weight losssecondary to #1.  4. Microcytic anemia  Transfused with packed red blood cells 07/25/2017, 16 2019, and 11/27/2017  5. History of thrombocytopenia secondary to chemotherapy  6. Port-A-Cath placement 06/04/2017  7. Elevated liver enzymes/bilirubin10/22/2018-likely early biliary obstruction secondary to pancreas cancer, status post ERCP-placement of a common bile duct stent 06/09/2018  8.Postprandial abdominal pain. Protonix initiated 07/16/2017.Trial of Reglan initiated 08/24/2017.  9.Admission with fever 07/24/2017-no source for infection identified, resolved   10.Diarrheafollowing cycle 3 FOLFIRINOX-resolved  11.Left hydronephrosis noted on CT12/03/2017-etiology unclear, urology consulted-stent not recommended.Mild left hydronephrosis noted on CT 10/25/2017. Creatinine normal 10/27/2017.  12. Admission 08/31/2017 with a high fever,  blood culture positive for gram-negative rods-Klebsiella pneumoniae identified  13.Fever 10/25/2017 status post evaluation in the emergency department. CT with findings concerning for enterocolitis.   14. Admission 11/11/2017 with a high fever-blood cultures (peripheral and Port-A-Cath) positive for gram-negative rods--STENOTROPHOMONAS MALTOPHILIA;course of Septra completed as an outpatient.  15. Rash-potentially secondary to gemcitabine or infection, improved  16. Pancytopenia secondary to chemotherapy  17.Intermittent fever. Question "tumor fever", question infection. Now on Bactrim prophylaxis, improved  18. Ascites.Paracentesis 01/14/2018- reactive mesothelial cells, mixed inflammation;culture positive for Klebsiella and enterococcustreated withAugmentin  Admission with fever 01/30/2018, blood cultures negative, discharged on ciprofloxacin/Flagyl  Recurrent ascites, paracentesis 02/23/2018-culture positive for strep viridans, placed on Levaquin  Paracentesis 03/03/2018- 2 L of fluid removed, culture and cytology negative  Trialof Aldactone/Lasix started 05/26/2018  19.Status post evaluation back pain in the emergency department 04/23/2018-MRI lumbar spine with L5-S1 left subarticular disc extrusion  L5-S1laminotomyand microdiscectomy 05/17/2018, pain resolved  20. Admission 06/16/2018 with fever and failure to thrive- cultures negative, suspect liver abscesses 21.  Elevated liver enzymes and bilirubin 07/02/2018  ERCP 07/05/2018- migration of previously noted biliary stent with dilated biliary tree, new uncovered metal stent placed in the common bile duct 22.  Failure to thrive 07/08/2018- blood culture positive for pseudomonas aeruginosa, repeat blood cultures 07/09/2018-positive for pseudomonas aeruginosa, admitted 07/09/2018 and started on cefepime  Crystal Carlson appears stable.  She continues to have a poor performance status.  Her prognosis is poor.  I  discussed the prognosis and treatment options with Crystal Carlson again today.  This included discussion with multiple family members including her niece (physician).  I reviewed the CT and MRI images with her niece.  I explained there is no apparent option for decompressing the biliary tract.  I think it is unlikely she will be able to clear the bacteremia.  She is not a candidate for further systemic therapy.  I recommend Hospice care.  She does not wish to enroll in hospice at present.  She would like to go home and continue intravenous antibiotics.  She will return for an office visit and further discussion  next week.  She will continue the current narcotic regimen.  Recommendations: 1.  Continue IV cefepime 2.  Repeat chemistry panel today 3.  I will check MSI testing on the 2018 liver biopsy as this could not be determined on the [facility] testing 4.  Outpatient follow-up at the Cancer center as scheduled on 07/21/2018     LOS: 7 days   Betsy Coder, MD   07/16/2018, 7:19 AM

## 2018-07-16 NOTE — Discharge Summary (Signed)
Physician Discharge Summary  Crystal Carlson DPO:[id number] DOB: [birth year] DOA: 07/09/2018  PCP: Mack Hook, MD  Admit date: 07/09/2018 Discharge date: 07/16/2018  Time spent: 45* minutes  Recommendations for Outpatient Follow-up:  1. Follow up Dr Orpah Clinton in one week  Discharge Diagnoses:  Principal Problem:   Pseudomonal bacteremia Active Problems:   Chronic pancreatitis ([place])   Microcytic anemia   HTN (hypertension)   Elevated LFTs   Pancreatic cancer metastasized to liver [facility])   Cancer related pain   Protein-calorie malnutrition, moderate (HCC)   Thrombocytopenia (HCC)   Cancer of head of pancreas (HCC)   Sepsis (HCC)   Abnormal magnetic resonance cholangiopancreatography (MRCP)   Biliary obstruction due to malignant neoplasm ([place])   Bacteremia due to Pseudomonas   Bloodstream infection due to Port-A-Cath   Discharge Condition: Stable  Diet recommendation: Regular diet  Filed Weights   07/14/18 0611 07/15/18 0536 07/16/18 0436  Weight: 49.9 kg 51.4 kg 53.3 kg    History of present illness:  52 year old female with a history of stage IV metastatic pancreatic cancer on chemotherapy, chronic anemia who was sent to the ED after blood cultures done as outpatient was positive for Pseudomonas.  She was admitted here 5 weeks ago with fever and chills for worsening metastatic disease and was found to have possible liver abscess and was discharged oral antibiotics.  ID and oncology has been following patient for Pseudomonas bacteremia.   Hospital Course:  1. Pseudomonas bacteremia-outpatient blood cultures grew Pseudomonas.  Patient started on cefepime.  Repeat blood cultures from 07/11/2018 are negative.  Patient has Port-A-Cath in place, continue with cefepime.    Patient likely has liver abscesses and would not be adequately treated with IV cefepime but family wants to continue with antibiotics.  Will discharge on IV cefepime 2 g every 12 hours for 7 more days.  Called  and discussed with  ID Dr. Drucilla Schmidt.  2. Leukocytosis-WBC worsening, 28,000.  Continue cefepime as above.  3. Stage IV pancreatic cancer-followed by oncology as outpatient.  She was on chemotherapy.  Recently had biliary stent exchange by GI for metastatic pancreatic cancer on 07/05/2018.  ID consulted GI, [facility] recommended MRCP to check for stent position.  MRCP shows infected metastasis or hepatic abscesses.  IR consulted for biliary drain/possible paracentesis.  GI has signed off.  Patient not ready for hospice.  Oncologist Dr. Malachy Mood  has discussed with family regarding second opinion. . Her niece who is a physician, is coming from [place]. She will discuss with Dr Orpah Clinton.  At this time family wants to continue with antibiotics and will continue to follow Dr. Malachy Mood.  Dr. Malachy Mood will see patient next Wednesday.  Patient has extremely poor prognosis and family is having a hard time accepting that.  4. Ascites due to pancreatic cancer-status post paracentesis yielding 2.1 L, continue Aldactone, Lasix.  4. Elevated INR- INR was 1.7 , 1 unit of FFP given for IR procedure as I have needed to be less than 1.5.  5. Severe protein calorie malnutrition-nutrition consulted  6. Hyponatremia-resolved       Procedures:  Paracentesis  Consultations:  GI  Oncology  IR  Discharge Exam: Vitals:   07/15/18 2016 07/16/18 0436  BP: 112/83 (!) 133/91  Pulse: (!) 102 (!) 111  Resp: 12 12  Temp: 98.1 F (36.7 C) 98.8 F (37.1 C)  SpO2: 97% 97%    General: Appears in no acute distress Cardiovascular: S1-S2, regular Respiratory: Clear to auscultation bilaterally  Discharge Instructions  Discharge Instructions    Diet - low sodium heart healthy   Complete by:  As directed    Increase activity slowly   Complete by:  As directed      Allergies as of 07/16/2018      Reactions   Pork-derived Products Other (See Comments)   RELIGIOUS BELIEF -  NO PORK      Medication List     STOP taking these medications   amoxicillin-clavulanate 875-125 MG tablet Commonly known as:  AUGMENTIN     TAKE these medications   ceFEPIme 2 g in sodium chloride 0.9 % 100 mL Inject 2 g into the vein every 12 (twelve) hours for 7 days.   ferrous sulfate 325 (65 FE) MG EC tablet Take 1 tablet (325 mg total) by mouth 3 (three) times daily with meals. What changed:  when to take this   furosemide 40 MG tablet Commonly known as:  LASIX Take 1 tablet (40 mg total) by mouth daily. Start taking on:  07/17/2018   hydrocortisone cream 1 % Apply 1 application topically 3 (three) times daily as needed for itching (minor skin irritation).   HYDROmorphone 4 MG tablet Commonly known as:  DILAUDID Take 1-2 tablets (4-8 mg total) by mouth every 4 (four) hours as needed for severe pain.   lactose free nutrition Liqd Take 237 mLs by mouth 3 (three) times daily with meals.   lidocaine-prilocaine cream Commonly known as:  EMLA Apply to port site one hour prior to use. Do not rub in. Cover with plastic. What changed:    how much to take  how to take this  when to take this  reasons to take this   LORazepam 0.5 MG tablet Commonly known as:  ATIVAN Take 1 tablet (0.5 mg total) by mouth every 8 (eight) hours as needed for anxiety (or nausea).   magnesium citrate Soln Take 0.75 Bottles by mouth daily as needed for mild constipation.   morphine 15 MG 12 hr tablet Commonly known as:  MS CONTIN Take 1 tablet (15 mg total) by mouth every 12 (twelve) hours.   ondansetron 4 MG tablet Commonly known as:  ZOFRAN Take 1 tablet (4 mg total) by mouth every 6 (six) hours as needed for nausea.   polyethylene glycol packet Commonly known as:  MIRALAX / GLYCOLAX Take 17 g by mouth daily as needed for mild constipation (use first).   predniSONE 5 MG tablet Commonly known as:  DELTASONE Take 1 tablet (5 mg total) by mouth daily.   spironolactone 100 MG tablet Commonly known as:   ALDACTONE Take 1 tablet (100 mg total) by mouth daily. Start taking on:  07/17/2018      Allergies  Allergen Reactions  . Pork-Derived Products Other (See Comments)    RELIGIOUS BELIEF -  NO PORK      The results of significant diagnostics from this hospitalization (including imaging, microbiology, ancillary and laboratory) are listed below for reference.    Significant Diagnostic Studies: Dg Chest 2 View  Result Date: 06/16/2018 CLINICAL DATA:  Feeling unwell for 3 days.  On chemotherapy. EXAM: CHEST - 2 VIEW COMPARISON:  11/11/2017 FINDINGS: Porta catheter on the right with tip at the upper cavoatrial junction. There is no edema, consolidation, effusion, or pneumothorax. Normal heart size and mediastinal contours. IMPRESSION: No evidence of active disease in the chest. Electronically Signed   By: Monte Fantasia M.D.   On: 06/16/2018 18:13   US Guided Needle Placement  Result Date:  06/22/2018 INDICATION: Ascites.  Cystic liver lesions. EXAM: ULTRASOUND GUIDED PARACENTESIS, ULTRASOUND-GUIDED LIVER CYST ASPIRATION MEDICATIONS: None. COMPLICATIONS: None immediate. PROCEDURE: Informed written consent was obtained from the patient after a discussion of the risks, benefits and alternatives to treatment. A timeout was performed prior to the initiation of the procedure. Initial ultrasound scanning demonstrates a large amount of ascites within the right lower abdominal quadrant. The right lower abdomen was prepped and draped in the usual sterile fashion. 1% lidocaine with epinephrine was used for local anesthesia. Following this, a 6 Fr Safe-T-Centesis catheter was introduced. An ultrasound image was saved for documentation purposes. The paracentesis was performed. The catheter was removed and a dressing was applied. The patient tolerated the procedure well without immediate post procedural complication. Subsequently, under sonographic guidance, a 21 gauge needle was inserted into 1 of the liver  cysts and 5 cc clear yellow fluid was aspirated. A sample was sent for culture and a additional sample was sent for cytology. FINDINGS: A total of approximately 2.2 L of clear yellow fluid was removed. Imaging documents needle placement in 1 of the liver cysts. IMPRESSION: Successful ultrasound-guided paracentesis yielding 2.2 L liters of peritoneal fluid. Successful ultrasound-guided liver cyst aspiration for cytology and culture. Electronically Signed   By: Marybelle Killings M.D.   On: 06/22/2018 13:36   Ct Abdomen Pelvis W Contrast  Result Date: 06/16/2018 CLINICAL DATA:  History of pancreatic cancer.  Weakness. EXAM: CT ABDOMEN AND PELVIS WITH CONTRAST TECHNIQUE: Multidetector CT imaging of the abdomen and pelvis was performed using the standard protocol following bolus administration of intravenous contrast. CONTRAST:  58mL OMNIPAQUE IOHEXOL 300 MG/ML SOLN orally, 20mL OMNIPAQUE IOHEXOL 300 MG/ML SOLN intravenously. COMPARISON:  CT scan of May 04, 2018. FINDINGS: Lower chest: No acute abnormality. Hepatobiliary: Multiple low densities are noted in the right hepatic lobe which are enlarged compared to prior exam consistent with metastatic disease. The largest measures 3.0 x 2.3 cm in dome of right hepatic lobe. No gallstones are noted. Stable appearance of biliary stent extending from common hepatic duct into duodenum. Pancreas: Grossly stable ill-defined mass is noted measuring 6.8 x 3.5 cm which extends into retroperitoneal soft tissues. There remains atrophy of the pancreatic body and tail with associated pancreatic ductal dilatation. Spleen: Maximum measured diameter of 17.5 cm which is unchanged compared to prior exam. No focal abnormality is noted. Adrenals/Urinary Tract: Adrenal glands appear normal. Mild left hydronephrosis is noted without obstructing calculus. Mild urinary bladder distention is noted. No renal or ureteral calculi are noted. Stomach/Bowel: The stomach is unremarkable. Wall  thickening of the duodenum and proximal jejunum is noted concerning for inflammation or edema. Stool is noted throughout the colon. No abnormal bowel dilatation is noted. The appendix is not visualized. Vascular/Lymphatic: Abdominal aorta is unremarkable. 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 Reproductive: Uterus and bilateral adnexa are unremarkable. Other: No abdominal wall hernia or abnormality. No abdominopelvic ascites. Musculoskeletal: No acute or significant osseous findings. IMPRESSION: Grossly stable ill-defined mass is seen arising from the pancreas measuring 6.8 x 3.5 cm, which extends into surrounding retroperitoneal soft tissues. Multiple low densities are noted in the right hepatic lobe which are increased in size compared to prior exam and consistent with worsening metastatic disease. Biliary stent is again noted. Continued wall thickening of duodenum and proximal jejunum is noted concerning for inflammation or edema. 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 Stable moderate splenomegaly. Mild left hydronephrosis is noted without  obstructing calculus; potentially there may be ureteral occlusion secondary to retroperitoneal neoplasm. Electronically Signed   By: Marijo Conception, M.D.   On: 06/16/2018 21:32   Mr 3d Recon At Scanner  Result Date: 07/12/2018 CLINICAL DATA:  Pancreatic cancer. EXAM: MRI ABDOMEN WITHOUT AND WITH CONTRAST (INCLUDING MRCP) TECHNIQUE: Multiplanar multisequence MR imaging of the abdomen was performed both before and after the administration of intravenous contrast. Heavily T2-weighted images of the biliary and pancreatic ducts were obtained, and three-dimensional MRCP images were rendered by post processing. CONTRAST:  4 cc Gadavist. COMPARISON:  MRI 06/19/2018 FINDINGS: Lower chest: No acute findings. Hepatobiliary: Innumerable peripherally enhancing cystic lesions are noted involving  predominantly the medial segment of left lobe of liver and right lobe of liver. Dominant lesion within segment 8 measures 3.6 cm, image 16/3. Previously 2.9. Lesion within segment 6 measures 3.0 cm, image 30/6. Previously 3.0 cm. There appears to be a small amount of gas within this lesion. Also within segment 6 is a 2.3 cm lesion, image 34/3. New from previous exam. Lesion within segment 5 measures 2.7 cm, image 31/3. Previously 1.8 cm. Anterior subcapsular lesion within segment 8 measures 1.5 cm, image 19/3. Previously 1.6 cm. There is marked dilatation of the intrahepatic bile ducts to the left lobe of liver, image 48/12. This has increased and caliber from the previous exam. There is mild dilatation of the intrahepatic bile ducts to the medial segment and right lobe of liver. There is a 2.3 cm area of high-grade stricturing involving the proximal left hepatic duct, image number 46/12. Findings are suspicious for malignant stricture. Common bile duct stent is in place. Filling defects within the common bile duct stent are identified. Cannot confirm stent patency. Pancreas: Large infiltrative mass centered around the head of pancreas is again noted. This is similar to 06/16/2018 measuring 3.6 x 6.6 cm with involvement of the celiac and superior mesenteric artery. 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 The mass extends into the retroperitoneum to encase the IVC and abdominal aorta, similar to previous study. Spleen: The spleen is enlarged measuring 16 cm in cranial caudal dimension. Adrenals/Urinary Tract: The adrenal glands are unremarkable. No kidney mass or hydronephrosis identified. Stomach/Bowel: Moderate distension of the stomach. There is mild increase caliber of the small bowel loops. No pathologic dilatation of the colon. Vascular/Lymphatic: Abdominal aorta and IVC are normal in caliber. There is soft tissue encasement of both the IVC and  abdominal aorta by pancreatic head mass. 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 No focal adenopathy. Other: Large volume of ascites is identified. Significantly increased from 06/16/2018. Musculoskeletal: No suspicious bone lesions identified. IMPRESSION: 1. Large pancreatic head mass is again identified with extensive vascular involvement and extension into the retroperitoneum with involvement of the aorta and IVC. 2. Multifocal liver lesions are again noted. Some of these appears stable while others are increased in size in the interval. As mentioned previously some of these liver lesions may reflect either infected metastasis or hepatic abscesses. 3. Progressive intrahepatic biliary dilatation to the lateral segment of left lobe of liver. Persistent mild biliary dilatation to the right lobe of liver. Cannot confirm patency of common bile duct stent. 4. Increase in volume of ascites. Electronically Signed   By: Kerby Moors M.D.   On: 07/12/2018 15:42   Mr 3d Recon At Scanner  Result Date: 06/19/2018 CLINICAL DATA:  Pancreatic cancer. New cystic lesions in the liver.  Fever and white count. Mildly elevated bilirubin several days ago EXAM: MRI ABDOMEN WITHOUT AND WITH CONTRAST (INCLUDING MRCP) TECHNIQUE: Multiplanar multisequence MR imaging of the abdomen was performed both before and after the administration of intravenous contrast. Heavily T2-weighted images of the biliary and pancreatic ducts were obtained, and three-dimensional MRCP images were rendered by post processing. CONTRAST:  None administered COMPARISON:  7.5 mL Gadavist FINDINGS: Lower chest:  Lung bases are clear. Hepatobiliary: Marked progression round cystic lesions primarily in the central LEFT hepatic lobes but also within the posterolateral RIGHT hepatic lobe and relatively sparing the LEFT lateral hepatic lobe. These lesions are rounded and have high signal intensity on T2 weighted imaging  consistent with cysts. There is subtle thin rim enhancement of these cysts noted on the subtracted imaging (image 11201). Lesions are immediately associated the biliary tree (image 77 of series 600) however no direct communication identified. Lesions do not restrict diffusion. There is minimal biliary duct dilatation in the LEFT hepatic lobe. Pancreas: Pancreatic mass surrounding the biliary stent not well depicted better evaluated by Pearson CT Spleen: Spleen is enlarged Adrenals/urinary tract: Adrenal glands poorly evaluated. Kidneys normal Stomach/Bowel: Stomach and limited of the small bowel is unremarkable Vascular/Lymphatic: Abdominal aortic normal caliber. No retroperitoneal periportal lymphadenopathy. Musculoskeletal: No aggressive osseous lesion IMPRESSION: 1. Multiple round cystic lesions throughout the LEFT RIGHT hepatic lobe advanced from CT 05/04/2018. Differential would include hepatic abscesses versus pancreatic metastasis versus biliary cystic process. Lesions appear to regular for abscesses. Favor pancreatic cystic metastatic disease versus cystic biliary process. FAVOR PANCREATIC METASTASIS in light of the elevated CA 19 9 and rapid advancement. 2. Moderate ascites. 3. Biliary stent in place with mild duct dilatation LEFT hepatic lobe. 4. Pancreatic mass not well evaluated.  See  comparison CT Electronically Signed   By: Suzy Bouchard M.D.   On: 06/19/2018 16:20   US Abdomen Limited  Result Date: 06/18/2018 CLINICAL DATA:  Pancreatic carcinoma and ascites. Prior paracentesis procedures with the most recent on 05/26/2018. EXAM: LIMITED ABDOMEN ULTRASOUND FOR ASCITES TECHNIQUE: Limited ultrasound survey for ascites was performed in all four abdominal quadrants. COMPARISON:  CT on 06/16/2018 FINDINGS: There is only a small volume of fluid in the peritoneal cavity, primarily around the liver. There was not enough fluid to warrant paracentesis. IMPRESSION: Small volume of ascites.  Paracentesis  was not performed today. Electronically Signed   By: Aletta Edouard M.D.   On: 06/18/2018 16:33   US Paracentesis  Result Date: 06/22/2018 INDICATION: Ascites.  Cystic liver lesions. EXAM: ULTRASOUND GUIDED PARACENTESIS, ULTRASOUND-GUIDED LIVER CYST ASPIRATION MEDICATIONS: None. COMPLICATIONS: None immediate. PROCEDURE: Informed written consent was obtained from the patient after a discussion of the risks, benefits and alternatives to treatment. A timeout was performed prior to the initiation of the procedure. Initial ultrasound scanning demonstrates a large amount of ascites within the right lower abdominal quadrant. The right lower abdomen was prepped and draped in the usual sterile fashion. 1% lidocaine with epinephrine was used for local anesthesia. Following this, a 6 Fr Safe-T-Centesis catheter was introduced. An ultrasound image was saved for documentation purposes. The paracentesis was performed. The catheter was removed and a dressing was applied. The patient tolerated the procedure well without immediate post procedural complication. Subsequently, under sonographic guidance, a 21 gauge needle was inserted into 1 of the liver cysts and 5 cc clear yellow fluid was aspirated. A sample was sent for culture and a additional sample was sent for cytology. FINDINGS: A total of approximately 2.2 L of  clear yellow fluid was removed. Imaging documents needle placement in 1 of the liver cysts. IMPRESSION: Successful ultrasound-guided paracentesis yielding 2.2 L liters of peritoneal fluid. Successful ultrasound-guided liver cyst aspiration for cytology and culture. Electronically Signed   By: Marybelle Killings M.D.   On: 06/22/2018 13:36   Dg Ercp  Result Date: 07/05/2018 CLINICAL DATA:  Pancreatic carcinoma and biliary obstruction. EXAM: ERCP TECHNIQUE: Multiple spot images obtained with the fluoroscopic device and submitted for interpretation post-procedure. COMPARISON:  Prior year CP on 06/09/2017, CT of the  abdomen on 06/16/2018 and MRI on 06/19/2018 FINDINGS: Single submitted image demonstrates overlapping self expanding metallic biliary stents with a more distal common bile duct stent appearing to extend to the duodenum and a second overlapping stent extending up into a right intrahepatic duct. IMPRESSION: Overlapping metallic biliary stents present to treat malignant biliary obstruction. These images were submitted for radiologic interpretation only. Please see the procedural report for the amount of contrast and the fluoroscopy time utilized. Electronically Signed   By: Aletta Edouard M.D.   On: 07/05/2018 14:36   Mr Abdomen Mrcp Moise Boring Contast  Result Date: 07/12/2018 CLINICAL DATA:  Pancreatic cancer. EXAM: MRI ABDOMEN WITHOUT AND WITH CONTRAST (INCLUDING MRCP) TECHNIQUE: Multiplanar multisequence MR imaging of the abdomen was performed both before and after the administration of intravenous contrast. Heavily T2-weighted images of the biliary and pancreatic ducts were obtained, and three-dimensional MRCP images were rendered by post processing. CONTRAST:  4 cc Gadavist. COMPARISON:  MRI 06/19/2018 FINDINGS: Lower chest: No acute findings. Hepatobiliary: Innumerable peripherally enhancing cystic lesions are noted involving predominantly the medial segment of left lobe of liver and right lobe of liver. Dominant lesion within segment 8 measures 3.6 cm, image 16/3. Previously 2.9. Lesion within segment 6 measures 3.0 cm, image 30/6. Previously 3.0 cm. There appears to be a small amount of gas within this lesion. Also within segment 6 is a 2.3 cm lesion, image 34/3. New from previous exam. Lesion within segment 5 measures 2.7 cm, image 31/3. Previously 1.8 cm. Anterior subcapsular lesion within segment 8 measures 1.5 cm, image 19/3. Previously 1.6 cm. There is marked dilatation of the intrahepatic bile ducts to the left lobe of liver, image 48/12. This has increased and caliber from the previous exam. There is  mild dilatation of the intrahepatic bile ducts to the medial segment and right lobe of liver. There is a 2.3 cm area of high-grade stricturing involving the proximal left hepatic duct, image number 46/12. Findings are suspicious for malignant stricture. Common bile duct stent is in place. Filling defects within the common bile duct stent are identified. Cannot confirm stent patency. Pancreas: Large infiltrative mass centered around the head of pancreas is again noted. This is similar to 06/16/2018 measuring 3.6 x 6.6 cm with involvement of the celiac and superior mesenteric artery. 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 The mass extends into the retroperitoneum to encase the IVC and abdominal aorta, similar to previous study. Spleen: The spleen is enlarged measuring 16 cm in cranial caudal dimension. Adrenals/Urinary Tract: The adrenal glands are unremarkable. No kidney mass or hydronephrosis identified. Stomach/Bowel: Moderate distension of the stomach. There is mild increase caliber of the small bowel loops. No pathologic dilatation of the colon. Vascular/Lymphatic: Abdominal aorta and IVC are normal in caliber. There is soft tissue encasement of both the IVC and abdominal aorta by pancreatic head mass. Portal venous confluence and portal vein are occluded with cavernous  transformation. No focal adenopathy. Other: Large volume of ascites is identified. Significantly increased from 06/16/2018. Musculoskeletal: No suspicious bone lesions identified. IMPRESSION: 1. Large pancreatic head mass is again identified with extensive vascular involvement and extension into the retroperitoneum with involvement of the aorta and IVC. 2. Multifocal liver lesions are again noted. Some of these appears stable while others are increased in size in the interval. As mentioned previously some of these liver lesions may reflect either infected metastasis or hepatic  abscesses. 3. Progressive intrahepatic biliary dilatation to the lateral segment of left lobe of liver. Persistent mild biliary dilatation to the right lobe of liver. Cannot confirm patency of common bile duct stent. 4. Increase in volume of ascites. Electronically Signed   By: Kerby Moors M.D.   On: 07/12/2018 15:42   Mr Abdomen Mrcp Moise Boring Contast  Result Date: 06/19/2018 CLINICAL DATA:  Pancreatic cancer. New cystic lesions in the liver. Fever and white count. Mildly elevated bilirubin several days ago EXAM: MRI ABDOMEN WITHOUT AND WITH CONTRAST (INCLUDING MRCP) TECHNIQUE: Multiplanar multisequence MR imaging of the abdomen was performed both before and after the administration of intravenous contrast. Heavily T2-weighted images of the biliary and pancreatic ducts were obtained, and three-dimensional MRCP images were rendered by post processing. CONTRAST:  None administered COMPARISON:  7.5 mL Gadavist FINDINGS: Lower chest:  Lung bases are clear. Hepatobiliary: Marked progression round cystic lesions primarily in the central LEFT hepatic lobes but also within the posterolateral RIGHT hepatic lobe and relatively sparing the LEFT lateral hepatic lobe. These lesions are rounded and have high signal intensity on T2 weighted imaging consistent with cysts. There is subtle thin rim enhancement of these cysts noted on the subtracted imaging (image 11201). Lesions are immediately associated the biliary tree (image 77 of series 600) however no direct communication identified. Lesions do not restrict diffusion. There is minimal biliary duct dilatation in the LEFT hepatic lobe. Pancreas: Pancreatic mass surrounding the biliary stent not well depicted better evaluated by Pearson CT Spleen: Spleen is enlarged Adrenals/urinary tract: Adrenal glands poorly evaluated. Kidneys normal Stomach/Bowel: Stomach and limited of the small bowel is unremarkable Vascular/Lymphatic: Abdominal aortic normal caliber. No retroperitoneal  periportal lymphadenopathy. Musculoskeletal: No aggressive osseous lesion IMPRESSION: 1. Multiple round cystic lesions throughout the LEFT RIGHT hepatic lobe advanced from CT 05/04/2018. Differential would include hepatic abscesses versus pancreatic metastasis versus biliary cystic process. Lesions appear to regular for abscesses. Favor pancreatic cystic metastatic disease versus cystic biliary process. FAVOR PANCREATIC METASTASIS in light of the elevated CA 19 9 and rapid advancement. 2. Moderate ascites. 3. Biliary stent in place with mild duct dilatation LEFT hepatic lobe. 4. Pancreatic mass not well evaluated.  See  comparison CT Electronically Signed   By: Suzy Bouchard M.D.   On: 06/19/2018 16:20   Ir Abdomen US Limited  Result Date: 07/13/2018 INDICATION: Biliary obstruction.  Ascites. EXAM: ULTRASOUND GUIDED PARACENTESIS, LIMITED ABDOMINAL ULTRASOUND, ABORTED PERCUTANEOUS TRANSHEPATIC CHOLANGIOGRAM AND BILIARY DRAIN MEDICATIONS: None. COMPLICATIONS: None immediate. PROCEDURE: Informed written consent was obtained from the patient after a discussion of the risks, benefits and alternatives to treatment. A timeout was performed prior to the initiation of the procedure. Imaging of the upper abdomen at the location of the left liver demonstrates no significant dilated left hepatic ducts. The left lobe of the liver is very atrophic. External biliary drain procedure was aborted. Initial ultrasound scanning demonstrates a large amount of ascites within the right lower abdominal quadrant. The right lower abdomen was prepped and draped in  the usual sterile fashion. 1% lidocaine with epinephrine was used for local anesthesia. Following this, a 6 Fr Safe-T-Centesis catheter was introduced. An ultrasound image was saved for documentation purposes. The paracentesis was performed. The catheter was removed and a dressing was applied. The patient tolerated the procedure well without immediate post procedural  complication. FINDINGS: Imaging of the left lobe of the liver demonstrates no significant dilated left hepatic ducts. A total of approximately 2.1 L of clear yellow fluid was removed. IMPRESSION: The biliary drain procedure into the left lobe of the liver was aborted. There were no suitable targets for entry as there were no significantly dilated bile ducts. Imaging over the right lobe of the liver demonstrates decompressed docs and multiple cystic areas. Successful ultrasound-guided paracentesis yielding 2.1 liters of peritoneal fluid. Electronically Signed   By: Marybelle Killings M.D.   On: 07/13/2018 15:33   Ir Paracentesis  Result Date: 07/13/2018 INDICATION: Biliary obstruction.  Ascites. EXAM: ULTRASOUND GUIDED PARACENTESIS, LIMITED ABDOMINAL ULTRASOUND, ABORTED PERCUTANEOUS TRANSHEPATIC CHOLANGIOGRAM AND BILIARY DRAIN MEDICATIONS: None. COMPLICATIONS: None immediate. PROCEDURE: Informed written consent was obtained from the patient after a discussion of the risks, benefits and alternatives to treatment. A timeout was performed prior to the initiation of the procedure. Imaging of the upper abdomen at the location of the left liver demonstrates no significant dilated left hepatic ducts. The left lobe of the liver is very atrophic. External biliary drain procedure was aborted. Initial ultrasound scanning demonstrates a large amount of ascites within the right lower abdominal quadrant. The right lower abdomen was prepped and draped in the usual sterile fashion. 1% lidocaine with epinephrine was used for local anesthesia. Following this, a 6 Fr Safe-T-Centesis catheter was introduced. An ultrasound image was saved for documentation purposes. The paracentesis was performed. The catheter was removed and a dressing was applied. The patient tolerated the procedure well without immediate post procedural complication. FINDINGS: Imaging of the left lobe of the liver demonstrates no significant dilated left hepatic ducts.  A total of approximately 2.1 L of clear yellow fluid was removed. IMPRESSION: The biliary drain procedure into the left lobe of the liver was aborted. There were no suitable targets for entry as there were no significantly dilated bile ducts. Imaging over the right lobe of the liver demonstrates decompressed docs and multiple cystic areas. Successful ultrasound-guided paracentesis yielding 2.1 liters of peritoneal fluid. Electronically Signed   By: Marybelle Killings M.D.   On: 07/13/2018 15:33    Microbiology: Recent Results (from the past 240 hour(s))  Culture, Blood     Status: Abnormal   Collection Time: 07/08/18 11:12 AM  Result Value Ref Range Status   Specimen Description BLOOD PORTA CATH RIGHT  Final   Special Requests   Final    BOTTLES DRAWN AEROBIC AND ANAEROBIC Blood Culture adequate volume   Culture  Setup Time   Final    GRAM NEGATIVE RODS IN BOTH AEROBIC AND ANAEROBIC BOTTLES CRITICAL RESULT CALLED TO, READ BACK BY AND VERIFIED WITH: S CARDWELL RN [id number] BY GF Performed at [facility], [place] [address], [place], [place] [address]    Culture PSEUDOMONAS AERUGINOSA (A)  Final   Report Status 07/11/2018 FINAL  Final   Organism ID, Bacteria PSEUDOMONAS AERUGINOSA  Final      Susceptibility   Pseudomonas aeruginosa - MIC*    CEFTAZIDIME <=1 SENSITIVE Sensitive     CIPROFLOXACIN >=4 RESISTANT Resistant     GENTAMICIN <=1 SENSITIVE Sensitive     IMIPENEM  1 SENSITIVE Sensitive     PIP/TAZO <=4 SENSITIVE Sensitive     CEFEPIME <=1 SENSITIVE Sensitive     * PSEUDOMONAS AERUGINOSA  Blood Culture ID Panel (Reflexed)     Status: Abnormal   Collection Time: 07/08/18 11:12 AM  Result Value Ref Range Status   Enterococcus species NOT DETECTED NOT DETECTED Final   Listeria monocytogenes NOT DETECTED NOT DETECTED Final   Staphylococcus species NOT DETECTED NOT DETECTED Final   Staphylococcus aureus (BCID) NOT DETECTED NOT DETECTED Final   Streptococcus species NOT DETECTED NOT  DETECTED Final   Streptococcus agalactiae NOT DETECTED NOT DETECTED Final   Streptococcus pneumoniae NOT DETECTED NOT DETECTED Final   Streptococcus pyogenes NOT DETECTED NOT DETECTED Final   Acinetobacter baumannii NOT DETECTED NOT DETECTED Final   Enterobacteriaceae species NOT DETECTED NOT DETECTED Final   Enterobacter cloacae complex NOT DETECTED NOT DETECTED Final   Escherichia coli NOT DETECTED NOT DETECTED Final   Klebsiella oxytoca NOT DETECTED NOT DETECTED Final   Klebsiella pneumoniae NOT DETECTED NOT DETECTED Final   Proteus species NOT DETECTED NOT DETECTED Final   Serratia marcescens NOT DETECTED NOT DETECTED Final   Carbapenem resistance NOT DETECTED NOT DETECTED Final   Haemophilus influenzae NOT DETECTED NOT DETECTED Final   Neisseria meningitidis NOT DETECTED NOT DETECTED Final   Pseudomonas aeruginosa DETECTED (A) NOT DETECTED Final    Comment: CRITICAL RESULT CALLED TO, READ BACK BY AND VERIFIED WITH: S CAEDWELL RN [phone] [id number] BY GF    Candida albicans NOT DETECTED NOT DETECTED Final   Candida glabrata NOT DETECTED NOT DETECTED Final   Candida krusei NOT DETECTED NOT DETECTED Final   Candida parapsilosis NOT DETECTED NOT DETECTED Final   Candida tropicalis NOT DETECTED NOT DETECTED Final    Comment: Performed at [facility], [address] [place], [place] [address]  Culture, blood (Routine X 2) w Reflex to ID Panel     Status: Abnormal   Collection Time: 07/09/18  5:18 PM  Result Value Ref Range Status   Specimen Description   Final    BLOOD BLOOD LEFT ARM Performed at [place] [address] [place], [place] [address]    Special Requests   Final    BOTTLES DRAWN AEROBIC AND ANAEROBIC Blood Culture adequate volume Performed at [place] [address] [place], [place] [address]    Culture  Setup Time   Final    GRAM NEGATIVE RODS AEROBIC BOTTLE ONLY CRITICAL VALUE NOTED.  VALUE IS CONSISTENT WITH  PREVIOUSLY REPORTED AND CALLED VALUE.    Culture (A)  Final    PSEUDOMONAS AERUGINOSA SUSCEPTIBILITIES PERFORMED ON PREVIOUS CULTURE WITHIN THE LAST 5 DAYS. Performed at [facility], [place] [address], [place], [place] [address]    Report Status 07/12/2018 FINAL  Final  Culture, blood (Routine X 2) w Reflex to ID Panel     Status: Abnormal   Collection Time: 07/09/18  6:19 PM  Result Value Ref Range Status   Specimen Description   Final    BLOOD PORTA CATH Performed at [facility] [address] [place], [place] [address]    Special Requests   Final    BOTTLES DRAWN AEROBIC AND ANAEROBIC Blood Culture adequate volume Performed at [facility] [address] [place],  [address]    Culture  Setup Time   Final    GRAM NEGATIVE RODS IN BOTH AEROBIC AND ANAEROBIC BOTTLES CRITICAL VALUE  NOTED.  VALUE IS CONSISTENT WITH PREVIOUSLY REPORTED AND CALLED VALUE.    Culture (A)  Final    PSEUDOMONAS AERUGINOSA SUSCEPTIBILITIES PERFORMED ON PREVIOUS CULTURE WITHIN THE LAST 5 DAYS. Performed at [facility], [place] [address] [place], [place] [address]    Report Status 07/12/2018 FINAL  Final  Culture, blood (Routine X 2) w Reflex to ID Panel     Status: None   Collection Time: 07/11/18  8:00 AM  Result Value Ref Range Status   Specimen Description   Final    BLOOD RIGHT ANTECUBITAL Performed at [facility] [address] [place], [place] [address]    Special Requests   Final    BOTTLES DRAWN AEROBIC ONLY Blood Culture adequate volume Performed at [place] [address] [place], [place] [address]    Culture   Final    NO GROWTH 5 DAYS Performed at [facility], [place] [address], [place], [place] [address]    Report Status 07/16/2018 FINAL  Final     Labs: Basic Metabolic Panel: Recent Labs  Lab 07/09/18 1717 07/10/18 0600 07/11/18 0501 07/14/18 0537 07/16/18 0528  NA  123* 126* 131* 129* 128*  K 5.0 4.5 4.2 3.9 3.9  CL 88* 94* 102 106 105  CO2 25 25 21* 19* 16*  GLUCOSE 204* 139* 90 111* 124*  BUN 37* 31* 23* 23* 35*  CREATININE 0.87 0.71 0.65 0.64 1.02*  1.01*  CALCIUM 8.4* 8.1* 8.3* 8.3* 8.5*  MG 2.4  --   --   --   --    Liver Function Tests: Recent Labs  Lab 07/09/18 1717 07/10/18 0600 07/11/18 0501 07/14/18 0537 07/16/18 0528  AST 38 29 39 39 38  ALT 73* 56* 50* 41 36  ALKPHOS 258* 212* 237* 256* 257*  BILITOT 9.0* 7.2* 7.5* 9.7* 7.9*  PROT 6.9 5.8* 5.8* 6.5 6.3*  ALBUMIN 2.0* 1.8* 1.7* 1.7* 1.6*   No results for input(s): LIPASE, AMYLASE in the last 168 hours. Recent Labs  Lab 07/09/18 1722  AMMONIA 55*   CBC: Recent Labs  Lab 07/09/18 1717 07/10/18 0600 07/11/18 0501 07/13/18 0541 07/14/18 0537  WBC 25.6* 20.8* 18.1* 27.6* 28.4*  NEUTROABS 23.3*  --  14.9*  --   --   HGB 9.5* 8.0* 8.6* 8.3* 8.4*  HCT 30.8* 25.6* 28.4* 27.1* 27.4*  MCV 82.4 83.4 85.0 84.7 86.4  PLT 228 176 201 189 186    CBG: Recent Labs  Lab 07/14/18 0837 07/14/18 1151 07/14/18 1703 07/14/18 2108 07/15/18 0800  GLUCAP 108* 102* 169* 139* 111*       Signed:  Oswald Hillock MD.  [facility] 07/16/2018, 12:33 PM

## 2018-07-16 NOTE — Progress Notes (Signed)
[facility]  [facility] will provide Home Infusion Pharmacy services and [facility] for home IV ABX at DC.  If patient discharges after hours, please call [phone].   Crystal Carlson 07/16/2018, 12:28 PM

## 2018-07-16 NOTE — Progress Notes (Signed)
PHARMACY CONSULT NOTE FOR:  OUTPATIENT  PARENTERAL ANTIBIOTIC THERAPY (OPAT)  Indication: Pseudomonal bacteremia Regimen: Cefepime 2 g iv q 12h End date: 07/23/18  IV antibiotic discharge orders are pended. To discharging provider:  Entered orders for [name] as telephone orders per Dr. Frederich Chick    Thank you for allowing pharmacy to be a part of this patient's care.  [name] 07/16/2018, 12:48 PM

## 2018-07-16 NOTE — Progress Notes (Signed)
[facility] Pathology department Suanne Marker) and requested MSI or IHC testing on case [id number] at request of Dr. Benay Spice.

## 2018-07-16 NOTE — Progress Notes (Addendum)
Spoke with patient and her husband about her allergy to pork derived medications. Husband states it is a Religious belief and not an allergy. He states she will take any medications pork derived that are needed for her care. She won't eat pork. Discharge instructions explained to patient and her husband. Port flushed with normal Saline. Spoke with Gwinda Passe from [facility] and she stated the patient didn't need the heparin flush for discharge because she will be getting her next dose of Maxipime around 19:30 tonight. Husband given six prescriptions to be filled. Discharged via wheelchair to husband.

## 2018-07-19 ENCOUNTER — Telehealth: Payer: Self-pay | Admitting: *Deleted

## 2018-07-19 DIAGNOSIS — C259 Malignant neoplasm of pancreas, unspecified: Secondary | ICD-10-CM

## 2018-07-19 DIAGNOSIS — R1900 Intra-abdominal and pelvic swelling, mass and lump, unspecified site: Secondary | ICD-10-CM

## 2018-07-19 DIAGNOSIS — C787 Secondary malignant neoplasm of liver and intrahepatic bile duct: Principal | ICD-10-CM

## 2018-07-19 NOTE — Telephone Encounter (Signed)
Husband called to request paracentesis on 07/21/18 when here to see MD. Scheduled therapeutic paracentesis for 12/4 at 11:00 and husband is aware.

## 2018-07-21 ENCOUNTER — Inpatient Hospital Stay: Payer: Medicaid Other | Attending: Nurse Practitioner | Admitting: Nurse Practitioner

## 2018-07-21 ENCOUNTER — Inpatient Hospital Stay: Payer: Medicaid Other

## 2018-07-21 ENCOUNTER — Encounter: Payer: Self-pay | Admitting: Nurse Practitioner

## 2018-07-21 ENCOUNTER — Ambulatory Visit (HOSPITAL_COMMUNITY)
Admission: RE | Admit: 2018-07-21 | Discharge: 2018-07-21 | Disposition: A | Discharge status: Home / Self Care | Payer: Medicaid Other | Source: Ambulatory Visit | Attending: Oncology | Admitting: Oncology

## 2018-07-21 VITALS — BP 115/92 | HR 118 | Temp 98.0°F | Resp 17

## 2018-07-21 DIAGNOSIS — C259 Malignant neoplasm of pancreas, unspecified: Secondary | ICD-10-CM

## 2018-07-21 DIAGNOSIS — Z9221 Personal history of antineoplastic chemotherapy: Secondary | ICD-10-CM | POA: Insufficient documentation

## 2018-07-21 DIAGNOSIS — R748 Abnormal levels of other serum enzymes: Secondary | ICD-10-CM | POA: Insufficient documentation

## 2018-07-21 DIAGNOSIS — C787 Secondary malignant neoplasm of liver and intrahepatic bile duct: Secondary | ICD-10-CM | POA: Insufficient documentation

## 2018-07-21 DIAGNOSIS — R188 Other ascites: Secondary | ICD-10-CM | POA: Insufficient documentation

## 2018-07-21 DIAGNOSIS — R1900 Intra-abdominal and pelvic swelling, mass and lump, unspecified site: Secondary | ICD-10-CM

## 2018-07-21 DIAGNOSIS — C257 Malignant neoplasm of other parts of pancreas: Secondary | ICD-10-CM | POA: Insufficient documentation

## 2018-07-21 DIAGNOSIS — Z95828 Presence of other vascular implants and grafts: Secondary | ICD-10-CM

## 2018-07-21 DIAGNOSIS — D509 Iron deficiency anemia, unspecified: Secondary | ICD-10-CM | POA: Insufficient documentation

## 2018-07-21 LAB — CMP (CANCER CENTER ONLY)
ALBUMIN: 1.6 g/dL — AB (ref 3.5–5.0)
ALK PHOS: 418 U/L — AB (ref 38–126)
ALT: 53 U/L — ABNORMAL HIGH (ref 0–44)
AST: 81 U/L — ABNORMAL HIGH (ref 15–41)
Anion gap: 15 (ref 5–15)
BILIRUBIN TOTAL: 10.4 mg/dL — AB (ref 0.3–1.2)
BUN: 79 mg/dL — AB (ref 6–20)
CALCIUM: 8.9 mg/dL (ref 8.9–10.3)
CO2: 12 mmol/L — ABNORMAL LOW (ref 22–32)
Chloride: 105 mmol/L (ref 98–111)
Creatinine: 2.38 mg/dL — ABNORMAL HIGH (ref 0.44–1.00)
GFR, EST NON AFRICAN AMERICAN: 23 mL/min — AB (ref >60)
GFR, Est AFR Am: 27 mL/min — ABNORMAL LOW (ref >60)
GLUCOSE: 112 mg/dL — AB (ref 70–99)
POTASSIUM: 4.1 mmol/L (ref 3.5–5.1)
Sodium: 132 mmol/L — ABNORMAL LOW (ref 135–145)
TOTAL PROTEIN: 7.7 g/dL (ref 6.5–8.1)

## 2018-07-21 LAB — CBC WITH DIFFERENTIAL (CANCER CENTER ONLY)
Abs Immature Granulocytes: 1.02 10*3/uL — ABNORMAL HIGH (ref 0.00–0.07)
BASOS ABS: 0 10*3/uL (ref 0.0–0.1)
Basophils Relative: 0 %
EOS PCT: 1 %
Eosinophils Absolute: 0.2 10*3/uL (ref 0.0–0.5)
HEMATOCRIT: 30 % — AB (ref 36.0–46.0)
HEMOGLOBIN: 9.6 g/dL — AB (ref 12.0–15.0)
Immature Granulocytes: 4 %
LYMPHS ABS: 0.8 10*3/uL (ref 0.7–4.0)
Lymphocytes Relative: 3 %
MCH: 27 pg (ref 26.0–34.0)
MCHC: 32 g/dL (ref 30.0–36.0)
MCV: 84.5 fL (ref 80.0–100.0)
Monocytes Absolute: 1.5 10*3/uL — ABNORMAL HIGH (ref 0.1–1.0)
Monocytes Relative: 5 %
NEUTROS ABS: 24.1 10*3/uL — AB (ref 1.7–7.7)
NRBC: 0 % (ref 0.0–0.2)
Neutrophils Relative %: 87 %
Platelet Count: 177 10*3/uL (ref 150–400)
RBC: 3.55 MIL/uL — ABNORMAL LOW (ref 3.87–5.11)
RDW: 24.5 % — AB (ref 11.5–15.5)
WBC Count: 27.6 10*3/uL — ABNORMAL HIGH (ref 4.0–10.5)

## 2018-07-21 MED ORDER — SODIUM CHLORIDE 0.9% FLUSH
10.0000 mL | Freq: Once | INTRAVENOUS | Status: AC
  Administered 2018-07-21: 10 mL
  Filled 2018-07-21: qty 10

## 2018-07-21 MED ORDER — LIDOCAINE HCL 1 % IJ SOLN
INTRAMUSCULAR | Status: AC
  Filled 2018-07-21: qty 20

## 2018-07-21 NOTE — Progress Notes (Addendum)
[place] OFFICE PROGRESS NOTE   Diagnosis: Pancreas cancer  INTERVAL HISTORY:   Crystal Carlson returns as scheduled.  Her husband reports she is not talking.  She has had no fever.  Abdomen is distended.  He thinks she is uncomfortable.  Energy level is poor.  Appetite is poor.  She did not take pain medication yesterday.  Objective:  Vital signs in last 24 hours:  Blood pressure (!) 115/92, pulse (!) 118, temperature 98 F (36.7 C), temperature source Oral, resp. rate 17, SpO2 100 %.   Cachectic appearing. HEENT: No thrush or ulcers.  Scleral icterus. Resp: Lungs clear bilaterally. Cardio: Regular rate and rhythm. GI: Abdomen is distended consistent with ascites. Vascular: No leg edema. Neuro: Awake.  Confused.  Follows commands. Skin: Jaundice. Port-A-Cath without erythema.   Lab Results:  Lab Results  Component Value Date   WBC 27.6 (H) 07/21/2018   HGB 9.6 (L) 07/21/2018   HCT 30.0 (L) 07/21/2018   MCV 84.5 07/21/2018   PLT 177 07/21/2018   NEUTROABS 24.1 (H) 07/21/2018    Imaging:  No results found.  Medications: I have reviewed the patient's current medications.  Assessment/Plan: 1. 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 Adenocarcinoma consistent with pancreatobiliary primary.  Cycle 1 FOLFIRINOX 06/18/2017  Cycle 2FOLFIRINOX 07/02/2017  Cycle 3 FOLFIRINOX 07/16/2017 (oxaliplatin dose reduced due to mild thrombocytopenia)  CT 07/24/2017-new splenomegaly and left hydronephrosis  Cycle 4 FOLFIRINOX  08/08/2017  Cycle 5 FOLFIRINOX 08/24/2017  CT abdomen/pelvis 09/03/2017-stable liver lesions and pancreas mass, stable right biliary dilatation, possible new 8 mm right liver lesion versus a vascular phenomena, lesion at the right paracolic gutter was present in the past-significance unclear  Cycle 6 FOLFIRINOX1/29/2019  Cycle7 FOLFIRINOX2/19/2019 (Irinotecan andOxaliplatin dose reduced due to thrombocytopenia)  CT abdomen/pelvis 10/25/2017- findings concerning for enterocolitis. No bowel obstruction. Ill-defined pancreatic head mass. Multiple hepatic hypoenhancing lesions with the largest measuring 1.9 x 2.0 cm, previously 1.7 x 1.7 cm; 9 x 10 mm hypoenhancing lesion in the inferior right lobe of the liver slightly larger compared to the previous CT; 1.6 x 2.4 cm enhancing lesion in the right paracolic gutter increased in size compared to the prior CT.  Cycle 1 gemcitabine/Abraxane 11/04/2017  Cycle 2 gemcitabine/Abraxane 11/25/2017  Cycle 3 gemcitabine/Abraxane 12/08/2017  Cycle 4 gemcitabine 12/23/2017 (Abraxane held secondary to neuropathy)  CTs abdomen/pelvis 01/12/2018- decrease in size of the pancreatic mass and hepatic metastasis; slight interval decrease in size of right paracolic gutter implant; worsening abdominal/pelvic ascites. Bowel wall thickening likely due to ascites and low albumin. 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  Cycle 5 gemcitabine 01/13/2018 (Abraxane held secondary to neuropathy)  Cycle 6 gemcitabine/Abraxane 01/27/2018  CT abdomen/pelvis 02/01/2018- dominant metastases in the liver are unchanged, multiple new low-density lesions throughout the right lobe-small abscesses?, No change in pancreas mass  Cycle 7 gemcitabine/Abraxane 02/16/2018(gemcitabine and Abraxane dose reduced due to previous neutropenia)  Cycle 8 gemcitabine/Abraxane 03/03/2018  Cycle 9 gemcitabine/Abraxane 03/25/2018  Cycle 10 gemcitabine/Abraxane  04/14/2018  Restaging CTs 05/04/2018- similar ill-defined pancreatic mass; multiple liver metastases again noted, appears slightly improved; increase in volume of abdominal and pelvic ascites; diffuse small  bowel wall edema and mild edema of the ascending colon and transverse colon.  Cycle 11 gemcitabine/Abraxane 05/05/2018  Cycle 12 gemcitabine/Abraxane 06/02/2018  CT 06/16/2018-stable pancreas mass extending into retroperitoneal soft tissue, multiple low-density lesions in the right liver-increased in size probable superior mesenteric vein thrombosis, mild left hydronephrosis  Ultrasound aspiration of a liver lesion 06/22/2018-negative cytology and culture  MRI abdomen/MRCP on 07/12/2018- pancreatic head mass with vascular involvement and extension into the retroperitoneum with involvement of the aorta and IVC, multifocal liver lesions-some are increased felt to represent infected metastases or hepatic abscesses, progressive left intrahepatic biliary dilatation with mild biliary dilatation at the right liver   2. Pain secondary to #1.Improved.  3. Weight losssecondary to #1.  4. Microcytic anemia  Transfused with packed red blood cells 07/25/2017, 16 2019, and 11/27/2017  5. History of thrombocytopenia secondary to chemotherapy  6. Port-A-Cath placement 06/04/2017  7. Elevated liver enzymes/bilirubin10/22/2018-likely early biliary obstruction secondary to pancreas cancer, status post ERCP-placement of a common bile duct stent 06/09/2018  8.Postprandial abdominal pain. Protonix initiated 07/16/2017.Trial of Reglan initiated 08/24/2017.  9.Admission with fever 07/24/2017-no source for infection identified, resolved   10.Diarrheafollowing cycle 3 FOLFIRINOX-resolved  11.Left hydronephrosis noted on CT12/03/2017-etiology unclear, urology consulted-stent not recommended.Mild left hydronephrosis noted on CT 10/25/2017. Creatinine normal 10/27/2017.  12.  Admission 08/31/2017 with a high fever, blood culture positive for gram-negative rods-Klebsiella pneumoniae identified  13.Fever 10/25/2017 status post evaluation in the emergency department. CT with findings concerning for enterocolitis.   14. Admission 11/11/2017 with a high fever-blood cultures (peripheral and Port-A-Cath) positive for gram-negative rods--STENOTROPHOMONAS MALTOPHILIA;course of Septra completed as an outpatient.  15. Rash-potentially secondary to gemcitabine or infection, improved  16. Pancytopenia secondary to chemotherapy  17.Intermittent fever. Question "tumor fever", question infection. Now on Bactrim prophylaxis, improved  18. Ascites.Paracentesis 01/14/2018- reactive mesothelial cells, mixed inflammation;culture positive for Klebsiella and enterococcustreated withAugmentin  Admission with fever 01/30/2018, blood cultures negative, discharged on ciprofloxacin/Flagyl  Recurrent ascites, paracentesis 02/23/2018-culture positive for strep viridans, placed on Levaquin  Paracentesis 03/03/2018- 2 L of fluid removed, culture and cytology negative  Trialof Aldactone/Lasix started 05/26/2018  19.Status post evaluation back pain in the emergency department 04/23/2018-MRI lumbar spine with L5-S1 left subarticular disc extrusion  L5-S1laminotomyand microdiscectomy 05/17/2018, pain resolved  20. Admission 06/16/2018 with fever and failure to thrive- cultures negative, suspect liver abscesses 21.Elevated liver enzymes and bilirubin 07/02/2018  ERCP 07/05/2018- migration of previously noted biliary stent with dilated biliary tree, new uncovered metal stent placed in the common bile duct 22.  Failure to thrive 07/08/2018- blood culture positive for pseudomonas aeruginosa, repeat blood cultures 07/09/2018-positive for pseudomonas aeruginosa, admitted 07/09/2018 and started on cefepime  Disposition: Ms. Iman continues to have a poor performance status.   Her husband understands she is not a candidate for additional chemotherapy.  He further understands that her lifespan is likely limited to a few days to a few weeks.  Dr. Benay Spice recommends a [facility] referral.  Crystal Carlson is in agreement.  We discussed end-of-life issues including CODE STATUS.  They will discuss further with the hospice team.  She will undergo a palliative paracentesis later today.  We reviewed her medication list and discontinued nonessential medications.  We scheduled a return visit in 1 week.  Patient seen with Dr. Benay Spice.  25 minutes were spent face-to-face at today's visit with the majority of that time involved in counseling/coordination of care.    [name] ANP/GNP-BC   07/21/2018  9:51 AM  This was a shared visit  with [name].  Crystal Carlson was interviewed and examined.  Her performance status has declined since discharge from the hospital last week.  The bilirubin remains elevated.  She is not a candidate for further systemic chemotherapy.  I reviewed her case again with interventional radiology.  There is intervention option for relieving the biliary obstruction.  I explained the poor prognosis to her husband.  I recommend home Hospice care.  Her husband is in agreement.  Crystal Carlson is lethargic and confused today.  We made a referral to the [facility] program.  I estimate her life span will be measured in days to a few weeks.  We discussed transfer to [facility].  [name], MD

## 2018-07-21 NOTE — Procedures (Addendum)
Ultrasound-guided therapeutic paracentesis performed yielding 4.8 liters of golden yellow fluid. No immediate complications. EBL< 1 cc.

## 2018-07-22 ENCOUNTER — Telehealth: Payer: Self-pay | Admitting: Oncology

## 2018-07-22 ENCOUNTER — Inpatient Hospital Stay: Payer: Medicaid Other

## 2018-07-22 NOTE — Telephone Encounter (Signed)
Scheduled appt per 12/4 los - left message for patient with appt date and time .

## 2018-07-23 ENCOUNTER — Telehealth: Payer: Self-pay

## 2018-07-23 NOTE — Telephone Encounter (Signed)
TC to pathology at [facility] to request MMR testing per Lattie Haw

## 2018-07-24 LAB — CANCER ANTIGEN 19-9: CA 19-9: 55816 U/mL — ABNORMAL HIGH (ref 0–35)

## 2018-07-26 ENCOUNTER — Encounter: Payer: Self-pay | Admitting: *Deleted

## 2018-07-26 ENCOUNTER — Telehealth: Payer: Self-pay

## 2018-07-26 NOTE — Progress Notes (Signed)
Received notification via fax  that patient died on 08/12/18 at 4:47 pm. Dr. Benay Spice notified.

## 2018-07-26 NOTE — Telephone Encounter (Signed)
TC to pathology per Lisa to cancel MSI/MMR testing on Crystal Carlson,Spoke to Amber in pathology who stated test was canceled. 

## 2018-07-29 ENCOUNTER — Ambulatory Visit: Payer: Medicaid Other | Admitting: Nurse Practitioner

## 2018-08-18 DEATH — deceased

## 2018-12-18 IMAGING — DX DG CHEST 2V
2 series · 2 of 2 positions shown · non-contrast
Comparison: Prior radiograph from 10/25/2017.

CLINICAL DATA: Initial evaluation for acute fever, weakness.
History of pancreatic cancer.

EXAM:
CHEST - 2 VIEW

[chest pa]
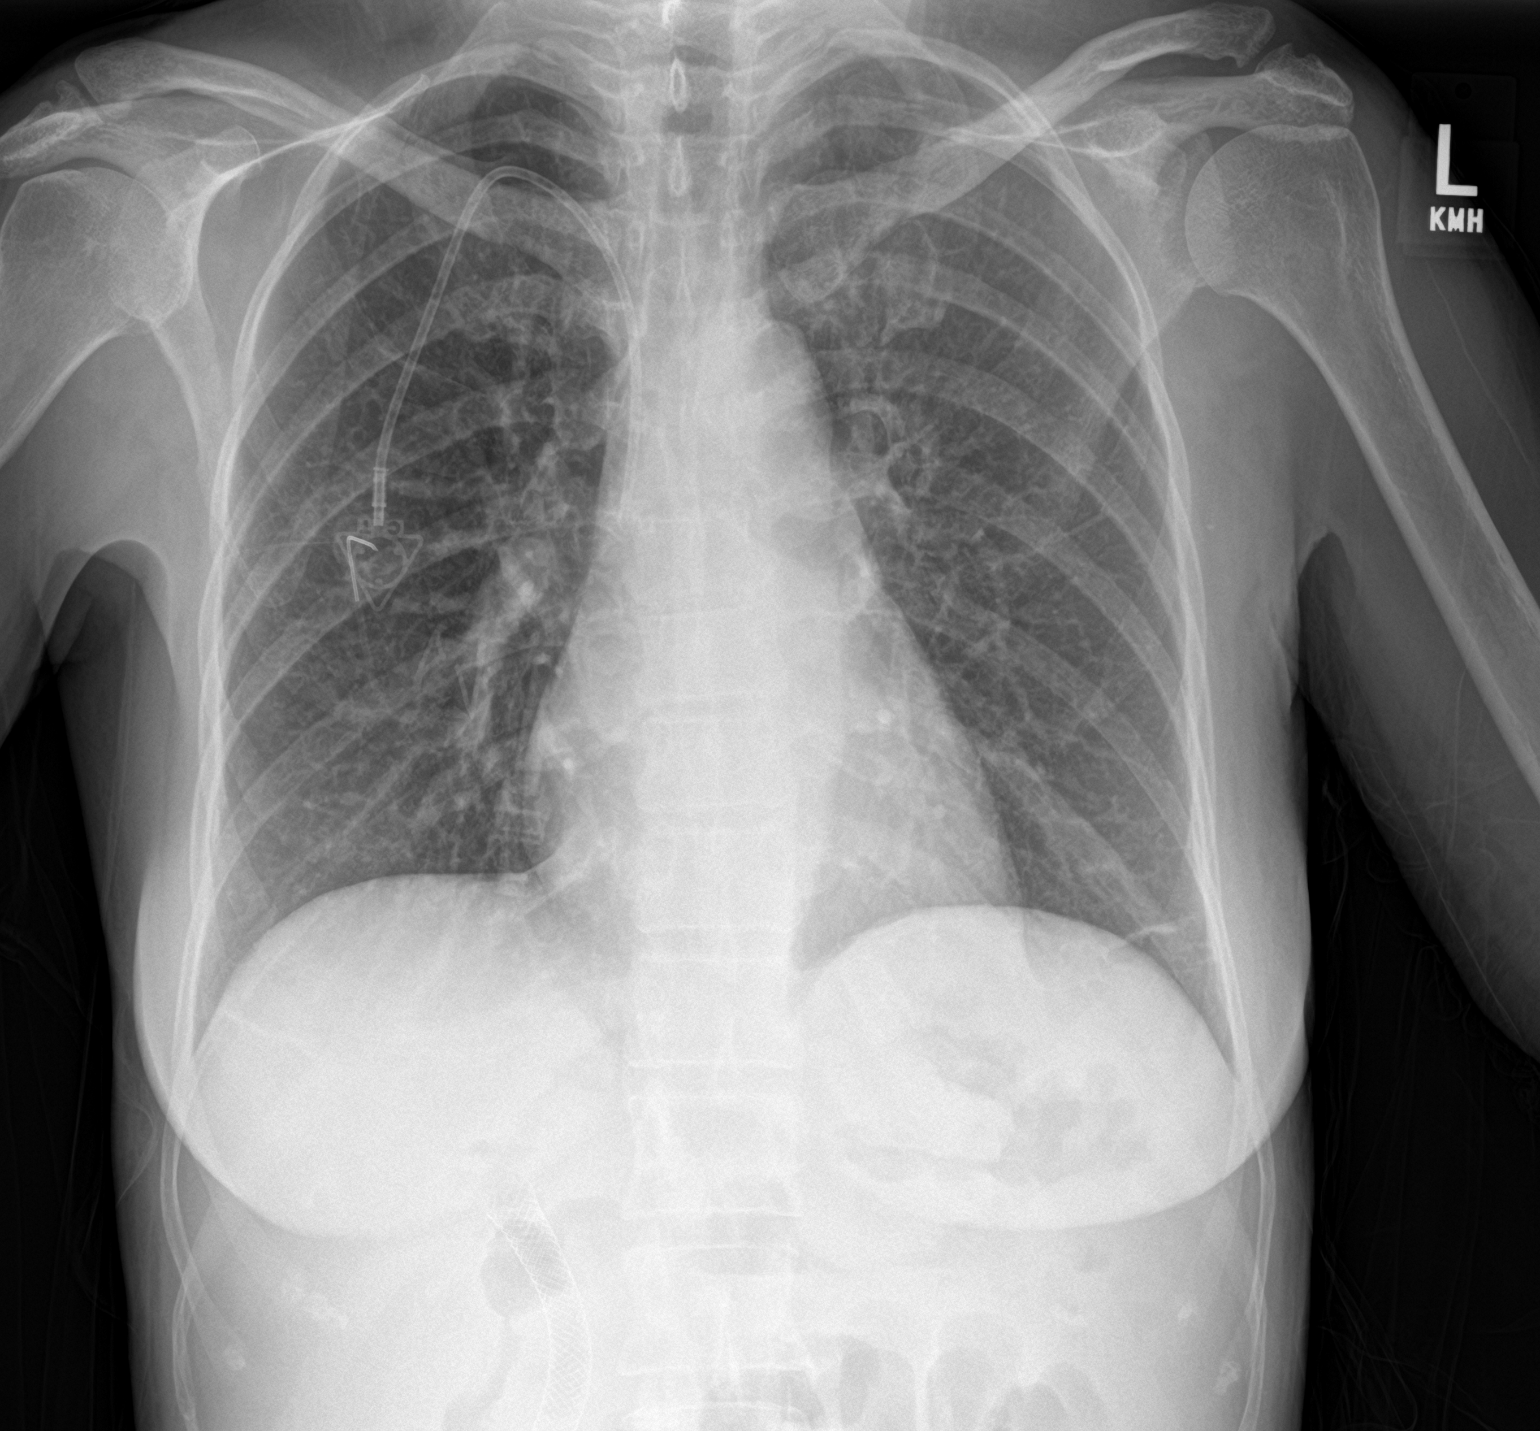

[chest lat]
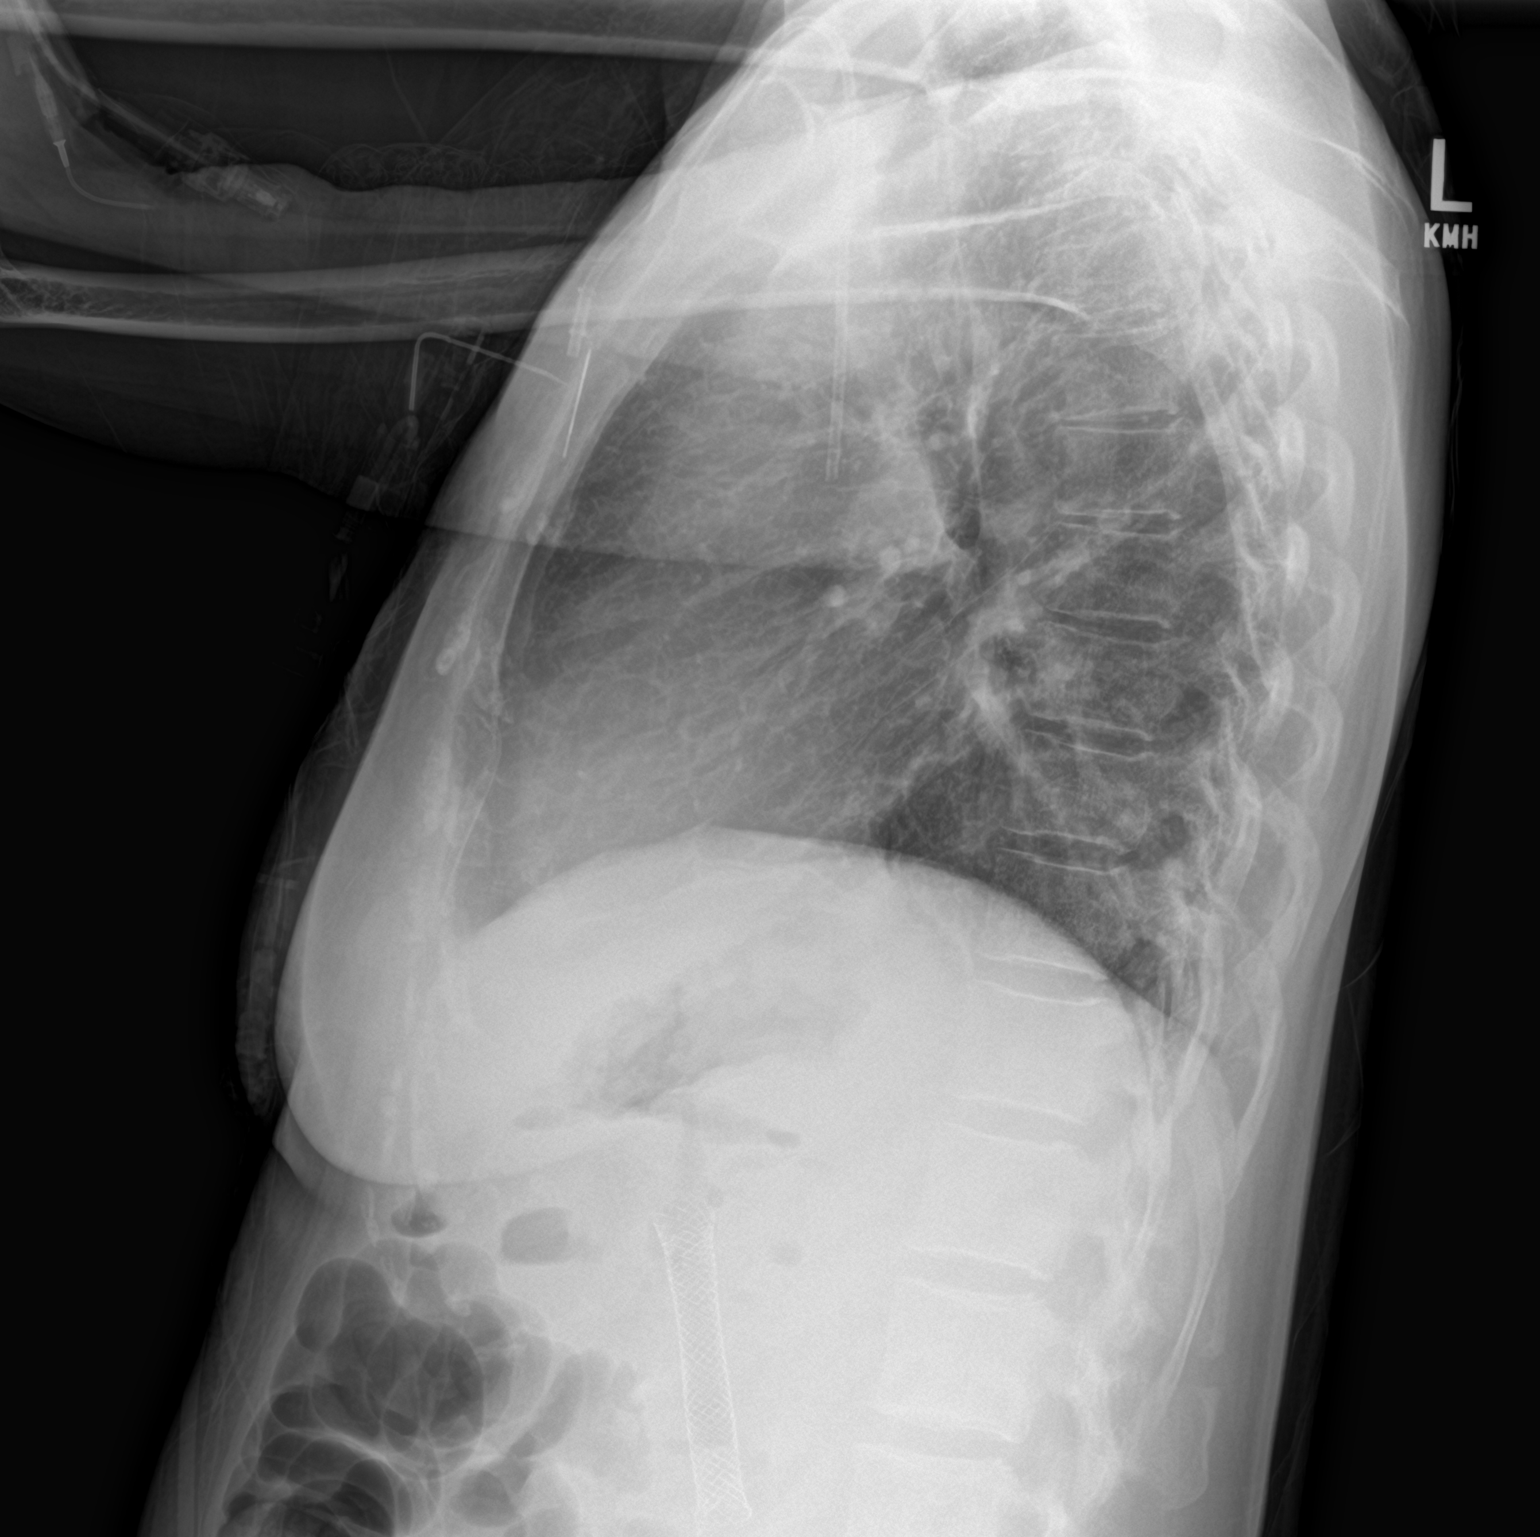

[2 of 2 positions shown; findings below may reference images not displayed]

FINDINGS: Right-sided Port-A-Cath in place, stable. Cardiac and mediastinal
silhouettes are stable in size and contour, and remain within normal
limits.

Lungs mildly hypoinflated. Mild left basilar scarring, similar to
previous. There is diffusely increased prominence of the
interstitial markings as compared to previous, which may reflect
mild interstitial congestion or possibly atypical infection. No
consolidative opacity to suggest alveolar pneumonia. No frank
pulmonary edema. No pleural effusion. No pneumothorax.

No acute osseus abnormality. Metallic biliary stent overlies the
right upper quadrant.
IMPRESSION: 1. Mild diffuse increased prominence of the interstitial markings,
which may reflect mild interstitial congestion or possibly atypical
infection.
2. No consolidative opacity to suggest pneumonia.

## 2019-03-10 IMAGING — CT CT ABD-PELV W/ CM
2 of 5 series · 15 of 46 positions shown, 17 images · IV contrast (iopamidol)
Comparison: CT 01/12/2018.

CLINICAL DATA: Abdominal pain and fever. Recently treated for
spontaneous bacterial peritonitis. History of metastatic pancreatic
cancer.

EXAM:
CT ABDOMEN AND PELVIS WITH CONTRAST
TECHNIQUE: Multidetector CT imaging of the abdomen and pelvis was performed
using the standard protocol following bolus administration of
intravenous contrast.
CONTRAST:  100mL [id number] IOPAMIDOL ([id number]) INJECTION 61%

[Series 2: axial st · axial · 0.81mm/px · z∈[+1071,+1491]mm · 12 of 96 slices shown, 14 images]
[im 6/96  soft-tissue]
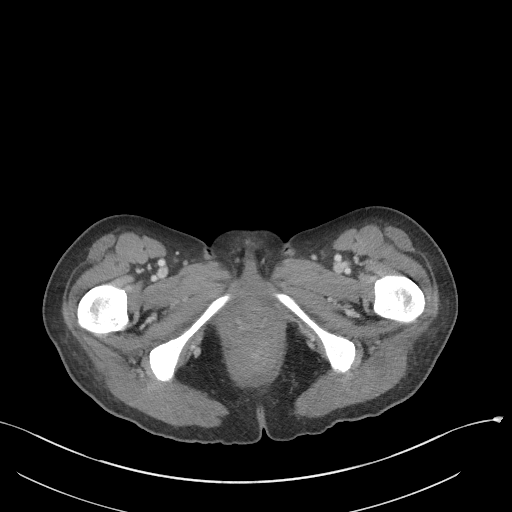
[im 6/96  bone]
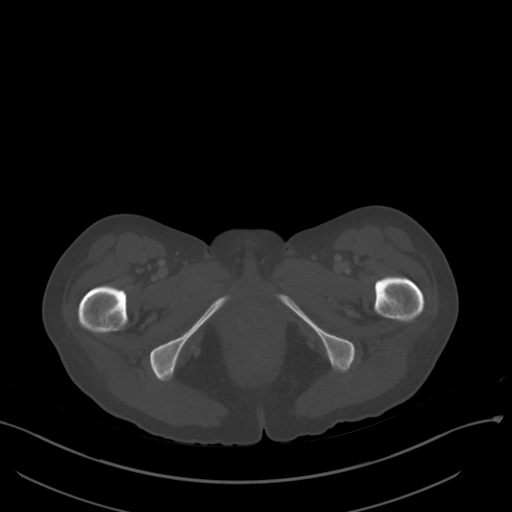
[im 17/96  soft-tissue]
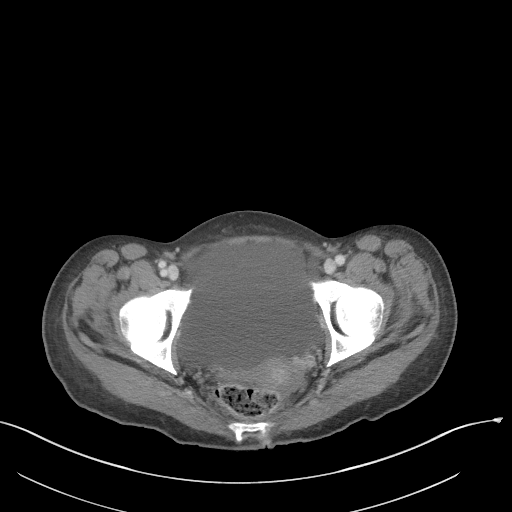
[im 23/96  soft-tissue]
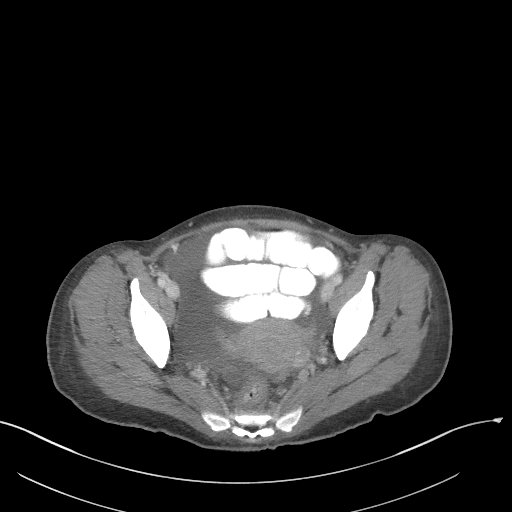
[im 28/96  soft-tissue]
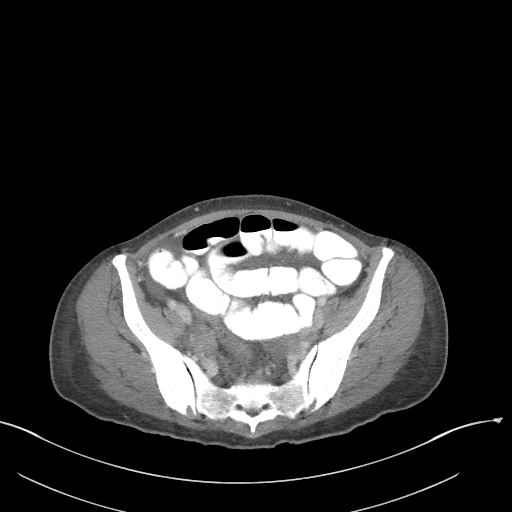
[im 40/96  soft-tissue]
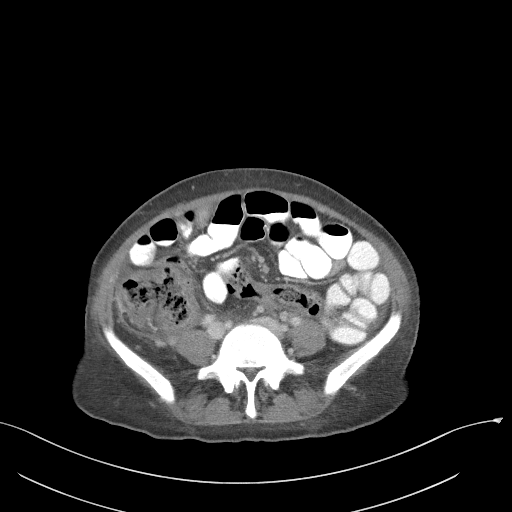
[im 45/96  soft-tissue]
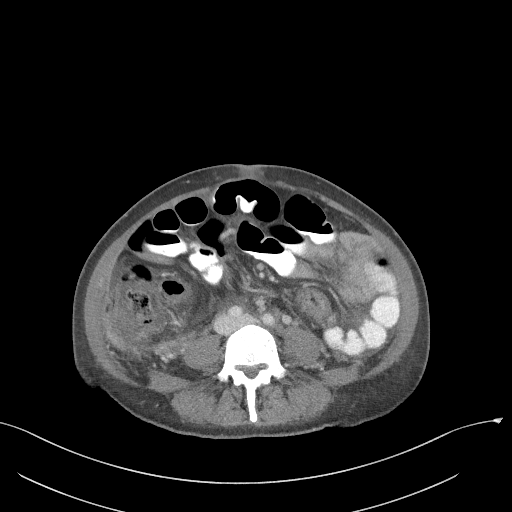
[im 51/96  soft-tissue]
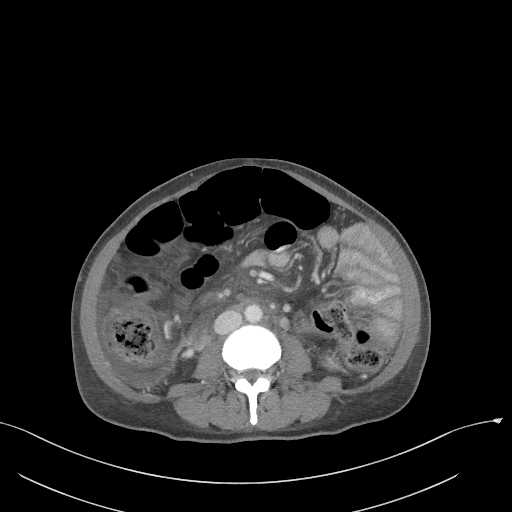
[im 62/96  soft-tissue]
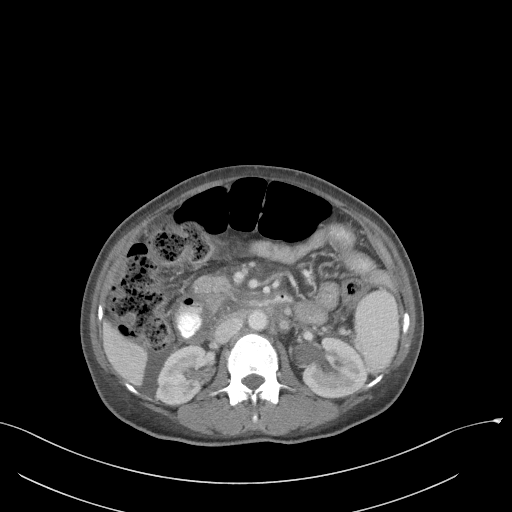
[im 68/96  soft-tissue]
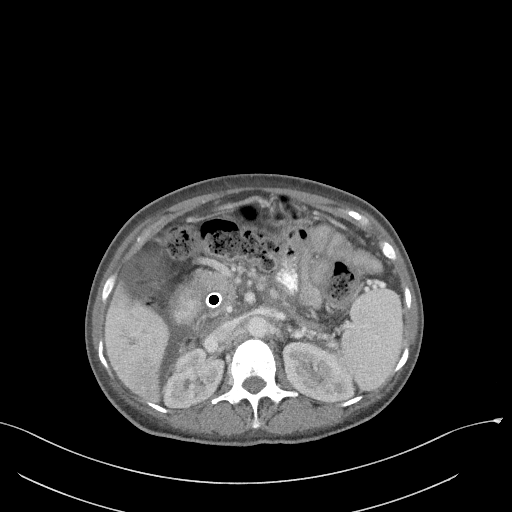
[im 68/96  bone]
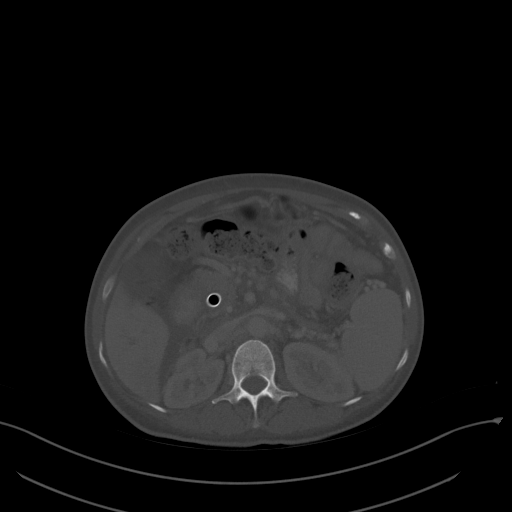
[im 73/96  soft-tissue]
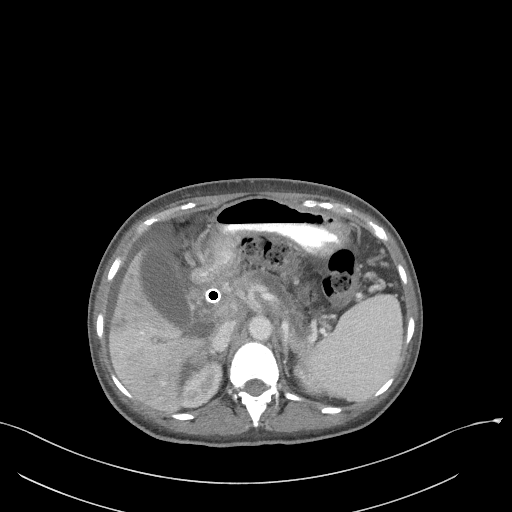
[im 84/96  soft-tissue]
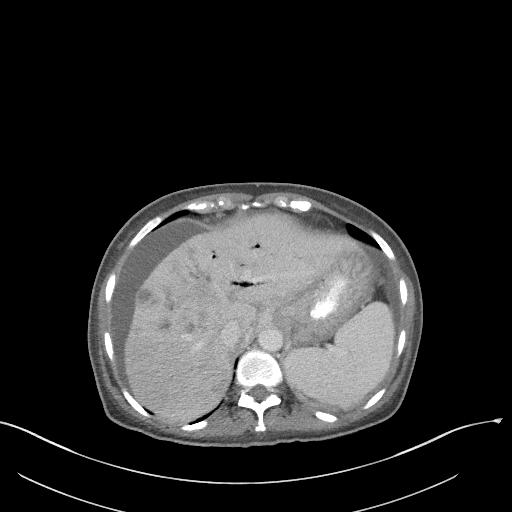
[im 90/96  soft-tissue]
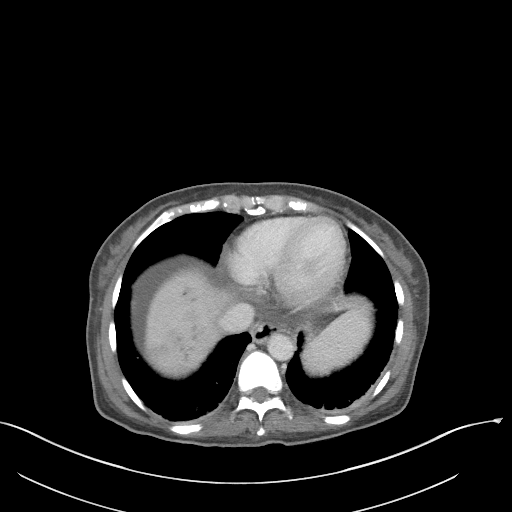

[Series 5: coronal st · coronal · 0.68mm/px · 3 of 84 slices shown]
[im 28/84  soft-tissue]
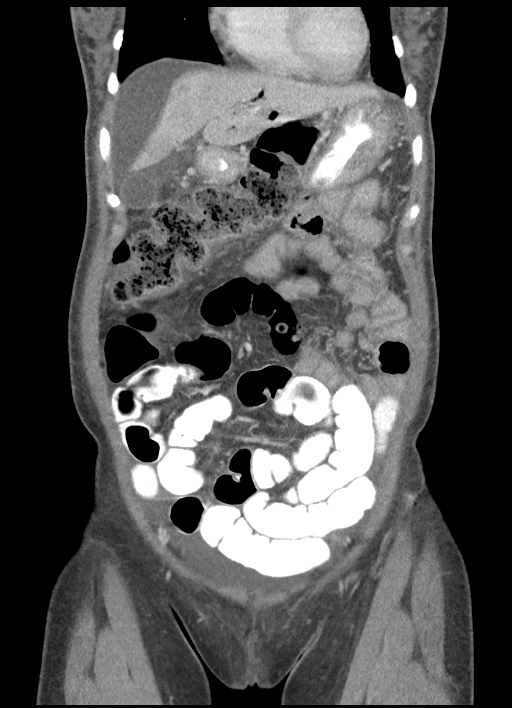
[im 37/84  soft-tissue]
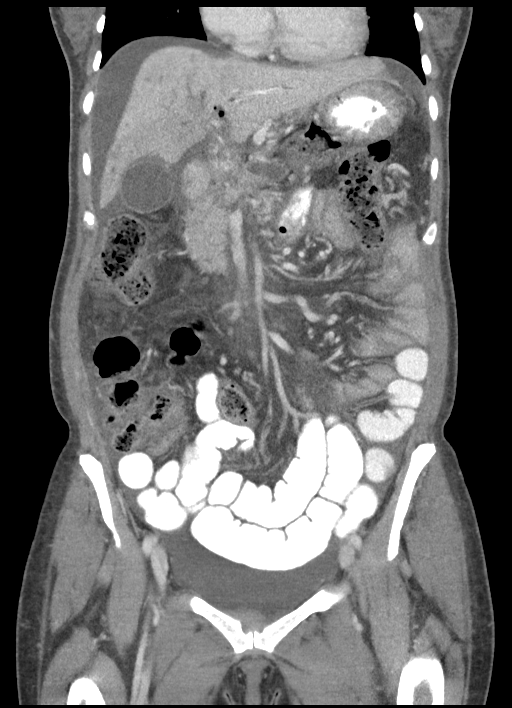
[im 47/84  soft-tissue]
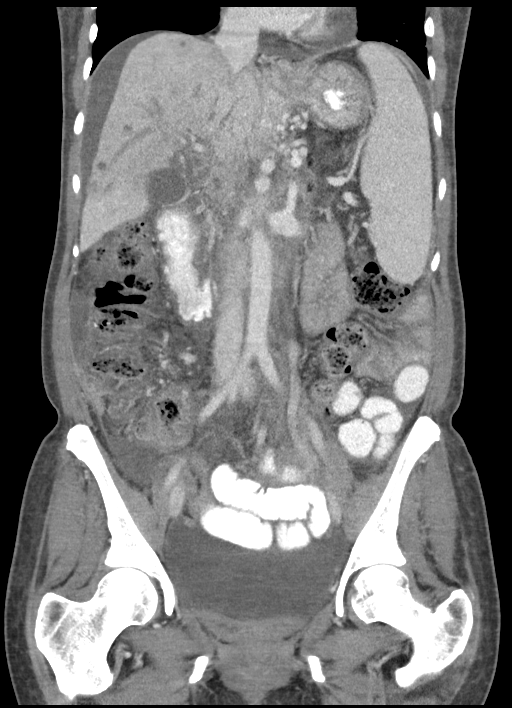

[15 of 46 positions shown; findings below may reference images not displayed]

FINDINGS: Lower chest: Trace pericardial fluid. No significant pleural
effusion. There is mild dependent atelectasis at both lung bases.

Hepatobiliary: The metallic stent within the common bile duct
remains patent and there is persistent pneumobilia. The gallbladder
appears normal. There is progressive diffuse heterogeneity
throughout the hepatic parenchyma. The dominant metastases reported
previously have not significantly changed, measuring approximately
13 x 16 mm in segment 8 (image [DATE]) and 11 mm in segment 6 (image
[DATE]). However, there are multiple new or enlarging low-density
lesions throughout the right lobe which may reflect treated
metastases or small abscesses, all measuring 11 mm or less in
greatest dimension.

Pancreas: The ill-defined mass surrounding the biliary stent has not
significantly changed. There is marked atrophy throughout the
pancreatic body and tail.

Spleen: Stable splenomegaly without focal abnormality.

Adrenals/Urinary Tract: Both adrenal glands appear normal. Both
kidneys no appear unremarkable without significant residual
heterogeneity in the left kidney as seen on prior study. There is no
hydronephrosis or delay in contrast excretion. The bladder is mildly
distended without focal abnormality.

Stomach/Bowel: No focal bowel wall thickening or significant
distention. Diffuse mild gastric and colonic wall thickening
attributed to ascites.

Vascular/Lymphatic: Ill-defined peripancreatic lymph nodes are
stable. There is no lower retroperitoneal lymphadenopathy. Stable
sequela of chronic portal and splenic vein thrombosis with cavernous
transformation. No acute vascular findings.

Reproductive: The uterus and ovaries appear normal. No endometrial
thickening suggested on the current study. No adnexal mass.

Other: Ascites has decreased in volume compared with the prior
study. There is mild edema throughout the mesenteric and
retroperitoneal fat. No peritoneal nodularity seen.

Musculoskeletal: No acute or significant osseous findings.
IMPRESSION: 1. The ascites has decreased in volume compared with the prior
study. There is stable edema throughout the intra-abdominal fat. No
discrete peritoneal nodularity.
2. The dominant hepatic metastases are similar in size to the study
performed 3 weeks ago. Several new or enlarging low-density hepatic
lesions are present which could reflect treated metastases or small
hepatic abscesses given the patient's current symptoms.
3. Patent biliary stent.
4. Grossly stable appearance of the ill-defined pancreatic head mass
and associated chronic central venous obstruction.

## 2019-07-02 IMAGING — US US PARACENTESIS
1 series · 5 of 5 positions shown · non-contrast
Comparison: none

INDICATION: Pancreatic cancer. Recurrent ascites. Request for therapeutic
paracentesis.

[Series 1: us paracentesis · 0.23mm/px · 5 of 5 slices shown]
[im 1/5]
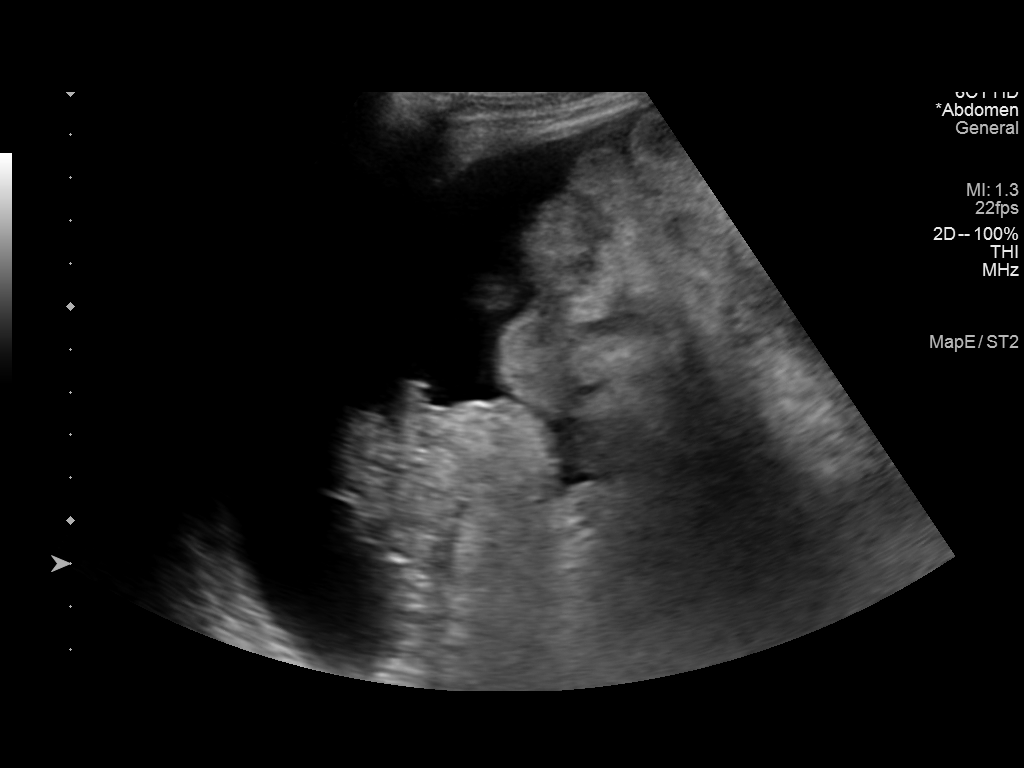
[im 2/5]
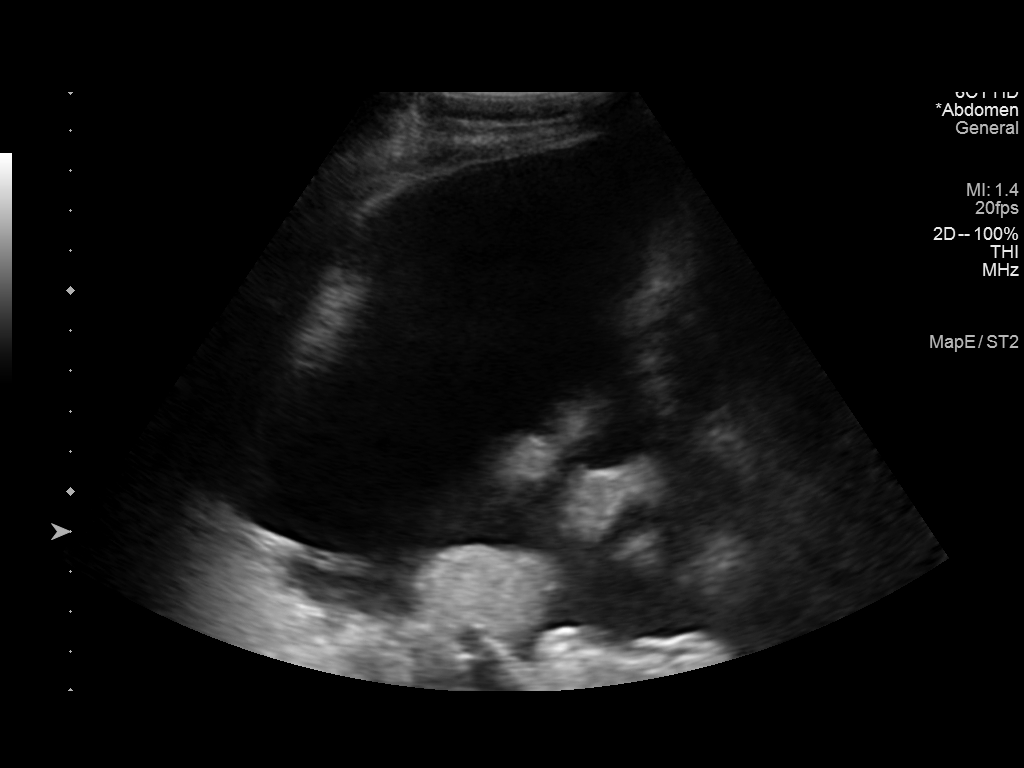
[im 3/5]
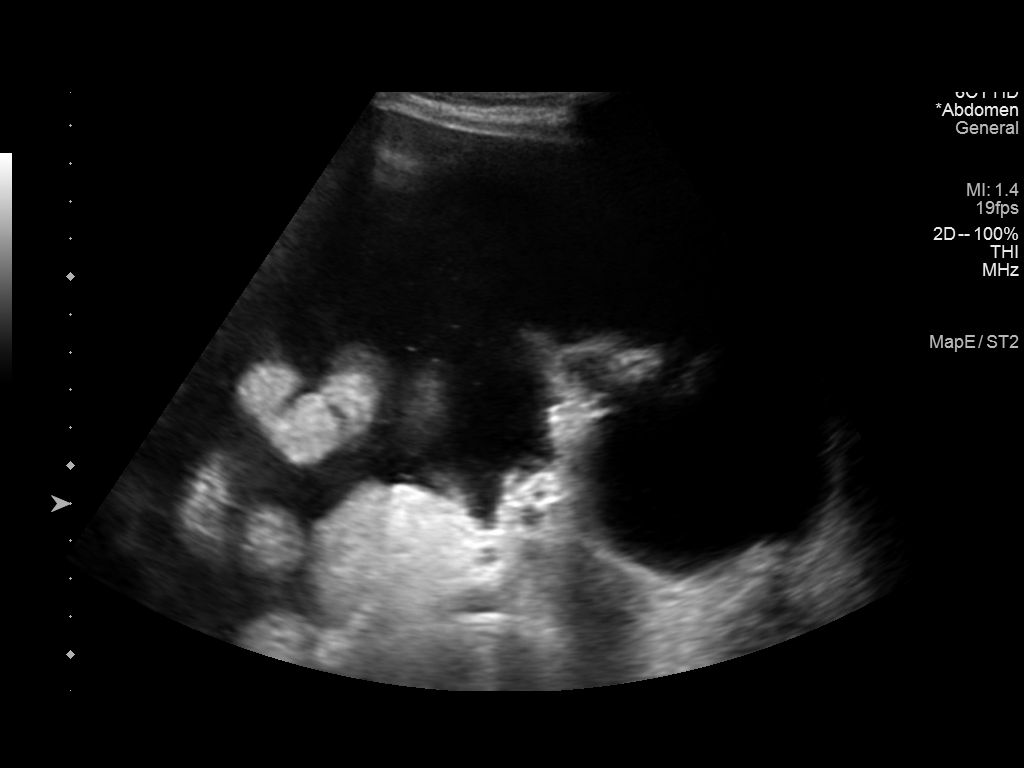
[im 4/5]
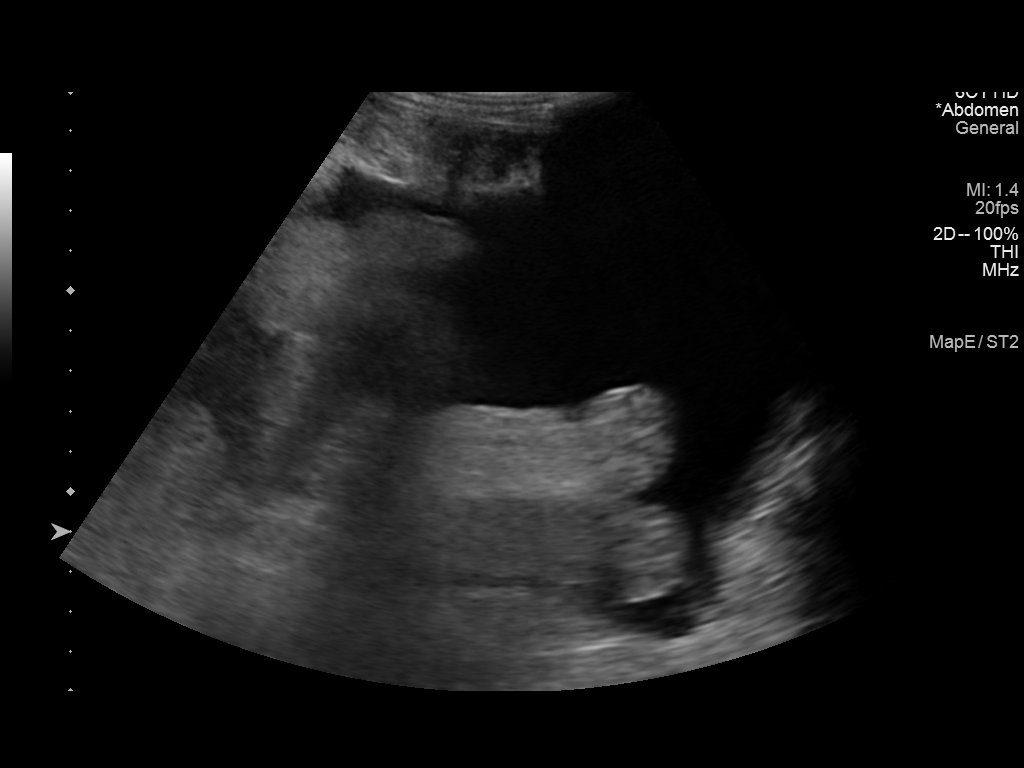
[im 5/5]
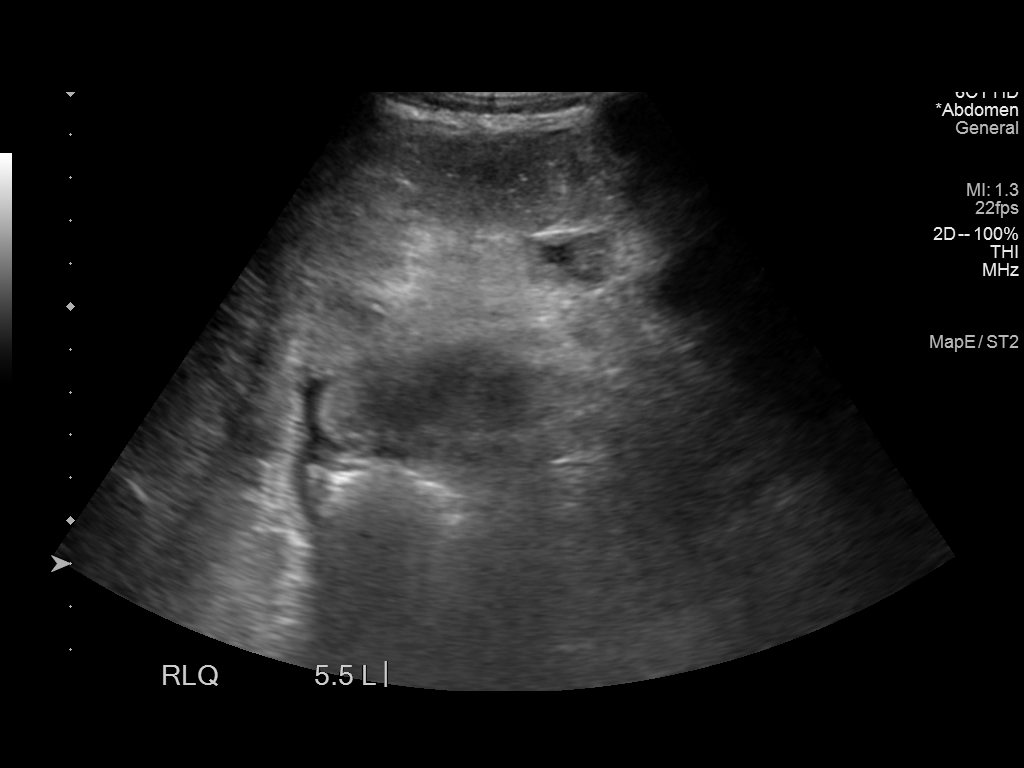

[5 of 5 positions shown; findings below may reference images not displayed]

EXAM:
ULTRASOUND GUIDED PARACENTESIS

MEDICATIONS:
1% lidocaine 10 mL

COMPLICATIONS:
None immediate.

PROCEDURE:
Informed written consent was obtained from the patient after a
discussion of the risks, benefits and alternatives to treatment. A
timeout was performed prior to the initiation of the procedure.

Initial ultrasound scanning demonstrates a large amount of ascites
within the right lower abdominal quadrant. The right lower abdomen
was prepped and draped in the usual sterile fashion. 1% lidocaine
with epinephrine was used for local anesthesia.

Following this, a 19 gauge, 7-cm, Yueh catheter was introduced. An
ultrasound image was saved for documentation purposes. The
paracentesis was performed. The catheter was removed and a dressing
was applied. The patient tolerated the procedure well without
immediate post procedural complication.
FINDINGS: A total of approximately 5.5 L of clear yellow fluid was removed.
IMPRESSION: Successful ultrasound-guided paracentesis yielding 5.4 liters of
peritoneal fluid.
